# Patient Record
Sex: Female | Born: 1935 | Race: Black or African American | Hispanic: No | Smoking: Never smoker
Health system: Southern US, Community
[De-identification: ages and names within clinical notes are randomized; demographics above are authoritative.]

## PROBLEM LIST (undated history)

## (undated) DIAGNOSIS — K219 Gastro-esophageal reflux disease without esophagitis: Secondary | ICD-10-CM

## (undated) DIAGNOSIS — D126 Benign neoplasm of colon, unspecified: Secondary | ICD-10-CM

## (undated) DIAGNOSIS — I4819 Other persistent atrial fibrillation: Secondary | ICD-10-CM

## (undated) DIAGNOSIS — D509 Iron deficiency anemia, unspecified: Secondary | ICD-10-CM

## (undated) DIAGNOSIS — I272 Pulmonary hypertension, unspecified: Secondary | ICD-10-CM

## (undated) DIAGNOSIS — N183 Chronic kidney disease, stage 3 unspecified: Secondary | ICD-10-CM

## (undated) DIAGNOSIS — M1990 Unspecified osteoarthritis, unspecified site: Secondary | ICD-10-CM

## (undated) DIAGNOSIS — E785 Hyperlipidemia, unspecified: Secondary | ICD-10-CM

## (undated) DIAGNOSIS — R0989 Other specified symptoms and signs involving the circulatory and respiratory systems: Secondary | ICD-10-CM

## (undated) DIAGNOSIS — IMO0002 Reserved for concepts with insufficient information to code with codable children: Secondary | ICD-10-CM

## (undated) DIAGNOSIS — R42 Dizziness and giddiness: Secondary | ICD-10-CM

## (undated) DIAGNOSIS — Z8701 Personal history of pneumonia (recurrent): Secondary | ICD-10-CM

## (undated) DIAGNOSIS — F419 Anxiety disorder, unspecified: Secondary | ICD-10-CM

## (undated) DIAGNOSIS — R51 Headache: Secondary | ICD-10-CM

## (undated) DIAGNOSIS — E039 Hypothyroidism, unspecified: Secondary | ICD-10-CM

## (undated) DIAGNOSIS — K529 Noninfective gastroenteritis and colitis, unspecified: Secondary | ICD-10-CM

## (undated) DIAGNOSIS — I10 Essential (primary) hypertension: Secondary | ICD-10-CM

## (undated) DIAGNOSIS — I071 Rheumatic tricuspid insufficiency: Secondary | ICD-10-CM

## (undated) HISTORY — DX: Personal history of pneumonia (recurrent): Z87.01

## (undated) HISTORY — DX: Headache: R51

## (undated) HISTORY — DX: Unspecified osteoarthritis, unspecified site: M19.90

## (undated) HISTORY — DX: Pulmonary hypertension, unspecified: I27.20

## (undated) HISTORY — DX: Other specified symptoms and signs involving the circulatory and respiratory systems: R09.89

## (undated) HISTORY — DX: Hyperlipidemia, unspecified: E78.5

## (undated) HISTORY — DX: Dizziness and giddiness: R42

## (undated) HISTORY — DX: Anxiety disorder, unspecified: F41.9

## (undated) HISTORY — DX: Chronic kidney disease, stage 3 unspecified: N18.30

## (undated) HISTORY — DX: Hypothyroidism, unspecified: E03.9

## (undated) HISTORY — PX: OTHER SURGICAL HISTORY: SHX169

## (undated) HISTORY — DX: Benign neoplasm of colon, unspecified: D12.6

## (undated) HISTORY — DX: Reserved for concepts with insufficient information to code with codable children: IMO0002

## (undated) HISTORY — DX: Noninfective gastroenteritis and colitis, unspecified: K52.9

## (undated) HISTORY — DX: Essential (primary) hypertension: I10

## (undated) HISTORY — DX: Iron deficiency anemia, unspecified: D50.9

## (undated) HISTORY — DX: Other persistent atrial fibrillation: I48.19

## (undated) HISTORY — PX: TUBAL LIGATION: SHX77

## (undated) HISTORY — DX: Rheumatic tricuspid insufficiency: I07.1

## (undated) HISTORY — DX: Gastro-esophageal reflux disease without esophagitis: K21.9

---

## 1981-12-29 DIAGNOSIS — D509 Iron deficiency anemia, unspecified: Secondary | ICD-10-CM

## 1981-12-29 DIAGNOSIS — I10 Essential (primary) hypertension: Secondary | ICD-10-CM

## 1981-12-29 HISTORY — DX: Essential (primary) hypertension: I10

## 1981-12-29 HISTORY — DX: Iron deficiency anemia, unspecified: D50.9

## 1985-12-29 DIAGNOSIS — R519 Headache, unspecified: Secondary | ICD-10-CM

## 1985-12-29 HISTORY — DX: Headache, unspecified: R51.9

## 1998-12-29 DIAGNOSIS — E785 Hyperlipidemia, unspecified: Secondary | ICD-10-CM

## 1998-12-29 HISTORY — DX: Hyperlipidemia, unspecified: E78.5

## 1999-12-30 DIAGNOSIS — R0989 Other specified symptoms and signs involving the circulatory and respiratory systems: Secondary | ICD-10-CM

## 1999-12-30 DIAGNOSIS — M1990 Unspecified osteoarthritis, unspecified site: Secondary | ICD-10-CM

## 1999-12-30 HISTORY — DX: Unspecified osteoarthritis, unspecified site: M19.90

## 1999-12-30 HISTORY — DX: Other specified symptoms and signs involving the circulatory and respiratory systems: R09.89

## 2000-02-07 ENCOUNTER — Encounter: Payer: Self-pay | Admitting: Family Medicine

## 2000-02-07 ENCOUNTER — Encounter: Admission: RE | Admit: 2000-02-07 | Discharge: 2000-02-07 | Payer: Self-pay | Admitting: Family Medicine

## 2000-07-25 ENCOUNTER — Emergency Department (HOSPITAL_COMMUNITY): Admission: EM | Admit: 2000-07-25 | Discharge: 2000-07-25 | Payer: Self-pay

## 2000-07-30 ENCOUNTER — Encounter: Payer: Self-pay | Admitting: Emergency Medicine

## 2000-07-30 ENCOUNTER — Encounter: Admission: RE | Admit: 2000-07-30 | Discharge: 2000-07-30 | Payer: Self-pay | Admitting: Emergency Medicine

## 2001-07-12 ENCOUNTER — Other Ambulatory Visit: Admission: RE | Admit: 2001-07-12 | Discharge: 2001-07-12 | Payer: Self-pay | Admitting: Family Medicine

## 2001-12-29 DIAGNOSIS — K219 Gastro-esophageal reflux disease without esophagitis: Secondary | ICD-10-CM

## 2001-12-29 HISTORY — DX: Gastro-esophageal reflux disease without esophagitis: K21.9

## 2002-12-06 ENCOUNTER — Encounter: Payer: Self-pay | Admitting: Family Medicine

## 2002-12-06 ENCOUNTER — Encounter: Admission: RE | Admit: 2002-12-06 | Discharge: 2002-12-06 | Payer: Self-pay | Admitting: Family Medicine

## 2003-05-19 ENCOUNTER — Emergency Department (HOSPITAL_COMMUNITY): Admission: EM | Admit: 2003-05-19 | Discharge: 2003-05-20 | Payer: Self-pay | Admitting: Emergency Medicine

## 2003-05-20 ENCOUNTER — Encounter: Payer: Self-pay | Admitting: Emergency Medicine

## 2003-12-30 DIAGNOSIS — D126 Benign neoplasm of colon, unspecified: Secondary | ICD-10-CM

## 2003-12-30 HISTORY — DX: Benign neoplasm of colon, unspecified: D12.6

## 2004-06-02 ENCOUNTER — Encounter: Admission: RE | Admit: 2004-06-02 | Discharge: 2004-06-02 | Payer: Self-pay | Admitting: Emergency Medicine

## 2004-09-16 HISTORY — PX: COLONOSCOPY W/ BIOPSIES AND POLYPECTOMY: SHX1376

## 2007-11-29 ENCOUNTER — Encounter: Admission: RE | Admit: 2007-11-29 | Discharge: 2007-11-29 | Payer: Self-pay | Admitting: Family Medicine

## 2007-12-30 DIAGNOSIS — E039 Hypothyroidism, unspecified: Secondary | ICD-10-CM

## 2007-12-30 HISTORY — DX: Hypothyroidism, unspecified: E03.9

## 2009-01-11 ENCOUNTER — Encounter: Payer: Self-pay | Admitting: Internal Medicine

## 2009-08-20 ENCOUNTER — Encounter (INDEPENDENT_AMBULATORY_CARE_PROVIDER_SITE_OTHER): Payer: Self-pay | Admitting: *Deleted

## 2009-11-26 ENCOUNTER — Encounter: Admission: RE | Admit: 2009-11-26 | Discharge: 2009-11-26 | Payer: Self-pay | Admitting: Family Medicine

## 2009-12-29 DIAGNOSIS — K529 Noninfective gastroenteritis and colitis, unspecified: Secondary | ICD-10-CM

## 2009-12-29 HISTORY — DX: Noninfective gastroenteritis and colitis, unspecified: K52.9

## 2010-01-09 ENCOUNTER — Telehealth: Payer: Self-pay | Admitting: Internal Medicine

## 2010-01-11 ENCOUNTER — Encounter: Payer: Self-pay | Admitting: Internal Medicine

## 2010-05-24 ENCOUNTER — Encounter: Payer: Self-pay | Admitting: Internal Medicine

## 2010-05-31 ENCOUNTER — Encounter: Payer: Self-pay | Admitting: Internal Medicine

## 2010-07-11 ENCOUNTER — Encounter: Payer: Self-pay | Admitting: Internal Medicine

## 2010-07-23 ENCOUNTER — Ambulatory Visit: Payer: Self-pay | Admitting: Internal Medicine

## 2010-07-23 DIAGNOSIS — I4821 Permanent atrial fibrillation: Secondary | ICD-10-CM

## 2010-07-23 HISTORY — DX: Permanent atrial fibrillation: I48.21

## 2010-07-23 LAB — CONVERTED CEMR LAB: Prothrombin Time: 32.8 s — ABNORMAL HIGH (ref 9.7–11.8)

## 2010-07-30 ENCOUNTER — Telehealth: Payer: Self-pay | Admitting: Internal Medicine

## 2010-08-02 ENCOUNTER — Ambulatory Visit: Payer: Self-pay | Admitting: Internal Medicine

## 2010-08-25 ENCOUNTER — Ambulatory Visit: Payer: Self-pay | Admitting: Internal Medicine

## 2010-08-25 ENCOUNTER — Inpatient Hospital Stay (HOSPITAL_COMMUNITY): Admission: EM | Admit: 2010-08-25 | Discharge: 2010-08-26 | Payer: Self-pay | Admitting: Emergency Medicine

## 2010-09-09 ENCOUNTER — Encounter: Payer: Self-pay | Admitting: Internal Medicine

## 2010-09-11 ENCOUNTER — Ambulatory Visit: Payer: Self-pay | Admitting: Internal Medicine

## 2010-09-11 DIAGNOSIS — I10 Essential (primary) hypertension: Secondary | ICD-10-CM

## 2010-09-12 LAB — CONVERTED CEMR LAB
Chloride: 109 meq/L (ref 96–112)
Creatinine, Ser: 1.3 mg/dL — ABNORMAL HIGH (ref 0.4–1.2)
Glucose, Bld: 112 mg/dL — ABNORMAL HIGH (ref 70–99)
Potassium: 3.7 meq/L (ref 3.5–5.1)
Sodium: 144 meq/L (ref 135–145)

## 2010-09-23 ENCOUNTER — Telehealth: Payer: Self-pay | Admitting: Internal Medicine

## 2010-10-03 ENCOUNTER — Ambulatory Visit: Payer: Self-pay | Admitting: Internal Medicine

## 2010-10-04 ENCOUNTER — Telehealth: Payer: Self-pay | Admitting: Internal Medicine

## 2010-10-08 ENCOUNTER — Telehealth: Payer: Self-pay | Admitting: Internal Medicine

## 2010-11-04 ENCOUNTER — Ambulatory Visit: Payer: Self-pay | Admitting: Internal Medicine

## 2011-01-10 ENCOUNTER — Encounter: Payer: Self-pay | Admitting: Internal Medicine

## 2011-01-28 NOTE — Progress Notes (Signed)
Summary: Schedule Colonoscopy  Phone Note Outgoing Call Call back at [facility] Phone [phone]   Call placed by: Harlow Mares CMA Duncan Dull),  January 09, 2010 1:51 PM Call placed to: Patient Summary of Call: called patient to advise her that she is past due for her colonoscopy and asked her if she would like to schedule. She said she recieved the letter and has been thinking about having her colonoscopy. I stressed the importance of having her procedure. She states that she will think about it and call back when she decides.  Initial call taken by: Harlow Mares CMA Duncan Dull),  January 09, 2010 1:59 PM

## 2011-01-28 NOTE — Assessment & Plan Note (Signed)
Summary: Nurse Visit (Infectious Disease)   Nurse Visit   Vital Signs:  Patient profile:   77 year old female Height:      66 inches Weight:      175.50 pounds BMI:     28.43 Pulse rate:   96 / minute BP sitting:   138 / 100  (left arm)  Vitals Entered By: Dennis Bast, RN, BSN (August 19, 2010 4:10 PM)  Visit Type:  nurse visit  CC:  c/o nerves being bad/hassn't taken BP med yet.   Current Medications (verified): 1)  Warfarin Sodium 5 Mg Tabs (Warfarin Sodium) .... Use As Directed 2)  Triamterene-Hctz 37.5-25 Mg Tabs (Triamterene-Hctz) .... Take One Tablet By Mouth Once Daily. 3)  Metoprolol Succinate 50 Mg Xr24h-Tab (Metoprolol Succinate) .Marland Kitchen.. 1 Tablet in Am 1/2 Tablet in Pm 4)  Diltiazem Hcl Er Beads 240 Mg Xr24h-Cap (Diltiazem Hcl Er Beads) .... Take One Capsule By Mouth Daily 5)  Simvastatin 20 Mg Tabs (Simvastatin) .... Take One Tablet By Mouth Daily At Bedtime 6)  Synthroid 25 Mcg Tabs (Levothyroxine Sodium) .... Take One Tablet By Mouth Once Daily. 7)  Tranxene-T 3.75 Mg Tabs (Clorazepate Dipotassium) .... Uad As Needed  Allergies (verified): No Known Drug Allergies   Patient Instructions: 1)  Your physician recommends that you schedule a follow-up appointment in: keep scheduled apt with Dr Johney Frame 2)  Per Dr Johney Frame continue on medications and keep scheduled follow up.

## 2011-01-28 NOTE — Assessment & Plan Note (Signed)
Summary: ok per kelly/per check out/sf/ appt is 4:15/ gd   Visit Type:  rov Primary Provider:  Mosetta Putt, MD  CC:   .  History of Present Illness: The patient presents today for electrophysiology followup after her recent hospital admission for vertigo and orthostasis. She reports doing well since discharge.  Her symptoms of dizziness have resolved.  She continues to have fatigue and SOB with her afib.  The patient denies symptoms of palpitations, chest pain, orthopnea, PND, lower extremity edema,  presyncope, syncope, or neurologic sequela. The patient is tolerating medications without difficulties and is otherwise without complaint today.   Current Medications (verified): 1)  Coumadin 3 Mg Tabs (Warfarin Sodium) .... As Directed 2)  Metoprolol Succinate 50 Mg Xr24h-Tab (Metoprolol Succinate) .Marland Kitchen.. 1 1/2 Tab Qam...1 Tab At Bedtime 3)  Diltiazem Hcl Er Beads 240 Mg Xr24h-Cap (Diltiazem Hcl Er Beads) .... Take One Capsule By Mouth Daily 4)  Simvastatin 20 Mg Tabs (Simvastatin) .... Take One Tablet By Mouth Daily At Bedtime 5)  Synthroid 25 Mcg Tabs (Levothyroxine Sodium) .... Take One Tablet By Mouth Once Daily. 6)  Lorazepam 1 Mg Tabs (Lorazepam) .Marland Kitchen.. 1 Tab At Bedtime 7)  Coumadin 1 Mg Tabs (Warfarin Sodium) .... As Directed  Allergies (verified): No Known Drug Allergies  Past History:  Past Medical History: Reviewed history from 07/23/2010 and no changes required. HTN Persistent atrial fibrillation Anxiety Hyperlipidemia DDD/DJD vertigo GERD Osteoporosis Hypothyroidism  Past Surgical History: Reviewed history from 07/23/2010 and no changes required. s/p BLT  Social History: Reviewed history from 07/23/2010 and no changes required. Lives in [place] alone.  Tob- denies.  ETOH-none.  Drugs- none  Review of Systems       All systems are reviewed and negative except as listed in the HPI.   Vital Signs:  Patient profile:   77 year old female Height:      66  inches Weight:      180 pounds BMI:     29.16 Pulse rate:   121 / minute Pulse rhythm:   irregular BP sitting:   140 / 88  (left arm) Cuff size:   large  Vitals Entered By: Danielle Rankin, CMA (September 11, 2010 4:12 PM)  Physical Exam  General:  Well developed, well nourished, in no acute distress. Head:  normocephalic and atraumatic Eyes:  PERRLA/EOM intact; conjunctiva and lids normal. Mouth:  Teeth, gums and palate normal. Oral mucosa normal. Neck:  Neck supple, no JVD. No masses, thyromegaly or abnormal cervical nodes. Lungs:  Clear bilaterally to auscultation and percussion. Heart:  iRRR, no m/r/g Abdomen:  Bowel sounds positive; abdomen soft and non-tender without masses, organomegaly, or hernias noted. No hepatosplenomegaly. Msk:  Back normal, normal gait. Muscle strength and tone normal. Pulses:  pulses normal in all 4 extremities Extremities:  No clubbing or cyanosis.  no edema Neurologic:  Alert and oriented x 3. Skin:  Intact without lesions or rashes. Cervical Nodes:  no significant adenopathy Psych:  Normal affect.   EKG  Procedure date:  09/11/2010  Findings:      afib,  V rate 121 bpm, LAD, otherwise normal ekg  Impression & Recommendations:  Problem # 1:  ATRIAL FIBRILLATION (ICD-427.31) stable, but with elevated ventricular rates unfortunately, her INR was recently subtherapeutic, so we will have to defer cardioversioin/ AAD until she is therapeutic for 4 weeeks. She was unable to afford pradaxa.  I will increase metoprolol today to 50mg  two times a day. we will consider propafenone once  INRs are stable  Problem # 2:  HYPERTENSION, MILD (ICD-401.1) stable triamterene/hctz was recently discontinued due to hypokalemia and dehydration we will check BMET today  Other Orders: TLB-BMP (Basic Metabolic Panel-BMET) (80048-METABOL)  Patient Instructions: 1)  Your physician recommends that you schedule a follow-up appointment in: 4 weeks with Dr Johney Frame 2)   Your physician has recommended you make the following change in your medication: increase Metoprolol to 50mg  bid Prescriptions: METOPROLOL SUCCINATE 50 MG XR24H-TAB (METOPROLOL SUCCINATE) 1 by mouth two times a day  #60 x 6   Entered by:   Dennis Bast, RN, BSN   Authorized by:   Hillis Range, MD   Signed by:   Dennis Bast, RN, BSN on 09/11/2010   Method used:   Electronically to        [id number]* (retail)       [address] [place], [place]  [address]       Ph: [phone]       Fax: [phone]   RxID:   [id number]

## 2011-01-28 NOTE — Assessment & Plan Note (Signed)
Summary: nep/afib   Visit Type:  Initial Consult Primary Provider:  Mosetta Putt, MD  CC:  afib.  History of Present Illness: Ms Courts is a pleasant 77 yo AAF with a h/o persistent atrial fibrillation who presents today for cardiology consultation regarding her afib.  She reports initial palpitations in 2010 for which she went to Dr Elsie Lincoln.  She states having a stress test which was normal and per report, echo revealed pulmonary htn.  Most recently, she began having palpitations several weeks ago.  She had a holter monitor placed which per Dr Geoffery Lyons report revealed PACs and nonsustained SVT.  She also had rare PVCs. The patient reports that over tha past week that she has had increase palpitations  and "irregular heart beat".  She reports associated fatigue and decreased exercise tolerance.  She was evaluated by Dr Duaine Dredge and found to have afib with RVR.  She was initiated on coumadin and now presents to our office for further evaluation.  Current Medications (verified): 1)  Warfarin Sodium 5 Mg Tabs (Warfarin Sodium) .... Use As Directed 2)  Triamterene-Hctz 37.5-25 Mg Tabs (Triamterene-Hctz) .... Take One Tablet By Mouth Once Daily. 3)  Metoprolol Succinate 25 Mg Xr24h-Tab (Metoprolol Succinate) .... Take 1 1/2  Tablets By Mouth Daily 4)  Diltiazem Hcl Er Beads 240 Mg Xr24h-Cap (Diltiazem Hcl Er Beads) .... Take One Capsule By Mouth Daily 5)  Simvastatin 20 Mg Tabs (Simvastatin) .... Take One Tablet By Mouth Daily At Bedtime 6)  Synthroid 25 Mcg Tabs (Levothyroxine Sodium) .... Take One Tablet By Mouth Once Daily. 7)  Tranxene-T 3.75 Mg Tabs (Clorazepate Dipotassium) .... Uad As Needed  Allergies (verified): No Known Drug Allergies  Past History:  Past Medical History: HTN Persistent atrial fibrillation Anxiety Hyperlipidemia DDD/DJD vertigo GERD Osteoporosis Hypothyroidism  Past Surgical History: s/p BLT  Family History: HTN and DM  Social History: Lives in  [place] alone.  Tob- denies.  ETOH-none.  Drugs- none  Review of Systems       All systems are reviewed and negative except as listed in the HPI.   Vital Signs:  Patient profile:   77 year old female Height:      66 inches Weight:      177 pounds BMI:     28.67 Pulse rate:   123 / minute Pulse rhythm:   irregularly irregular BP sitting:   110 / 70  (left arm)  Vitals Entered By: Laurance Flatten CMA (July 23, 2010 1:33 PM)  Physical Exam  General:  Well developed, well nourished, in no acute distress. Head:  normocephalic and atraumatic Eyes:  PERRLA/EOM intact; conjunctiva and lids normal. Mouth:  Teeth, gums and palate normal. Oral mucosa normal. Neck:  Neck supple, no JVD. No masses, thyromegaly or abnormal cervical nodes. Lungs:  Clear bilaterally to auscultation and percussion. Heart:  iRRR, no m/r/g Abdomen:  Bowel sounds positive; abdomen soft and non-tender without masses, organomegaly, or hernias noted. No hepatosplenomegaly. Msk:  Back normal, normal gait. Muscle strength and tone normal. Pulses:  pulses normal in all 4 extremities Extremities:  No clubbing or cyanosis.  no edema Neurologic:  Alert and oriented x 3. Skin:  Intact without lesions or rashes. Cervical Nodes:  no significant adenopathy Psych:  Normal affect.   EKG  Procedure date:  07/23/2010  Findings:      afib,  V rates 123 bpm, nonspecific ST/T changes, LAD  Echocardiogram  Procedure date:  01/11/2009  Findings:      LVEF >  55%, mild LVH,  borderline RV enlargement, mild MR, RVSP 40-50,  LA size 36 cm,  mild to moderate TR  Comments:      [facility] echo  Nuclear Study  Procedure date:  01/11/2009  Findings:      lexiscan myoview, no evidence of ischemia,  EF 86%  Comments:      [facility] study  Impression & Recommendations:  Problem # 1:  ATRIAL FIBRILLATION (ICD-427.31) Ms Balla is a pleasant 77 yo female with symptomatic persistent atrial fibrillation who presents today for  cardiology evalation and further management.  She has symptomatic atrial fibrillation.  Her CHADS2 score is 2 (age and HTN).  She is appropriately anticoagulated with coumadin.  Therapeutic strategies for afib were discussed in detail with the patient today.  At this point, we will focus our efforts on rate control.  I have increased metoprolol succinate to 50mg  qam and 25mg  qpm.  She will return later this week for a nursing visit and we will increase metoprolol further (100mg  qam) if her heart rates remain above 100 bpm at that time. She will have weekly INRS followed by Dr Duaine Dredge.  Once her INRs have been >2 for four consecutive weeks, we will plan antiarrhytmic medical therapy.  As she has no evidence of ischemia by nuclear study 1/10, I would plan flecainide for rhythm control once her INRs are adequate.  Other Orders: TLB-PT (Protime) (85610-PTP)  Patient Instructions: 1)  Your physician recommends that you schedule a follow-up appointment in: 4 weeks.  2)  One week nurse visit.  3)  Your physician has recommended you make the following change in your medication: Increase Metoporol  to 50mg  1 tablet in the AM and 1/2 tablet in the PM. Prescriptions: METOPROLOL SUCCINATE 50 MG XR24H-TAB (METOPROLOL SUCCINATE) 1 tablet in AM 1/2 tablet in PM  #45 x 11   Entered by:   Dennis Bast, RN, BSN   Authorized by:   Hillis Range, MD   Signed by:   Dennis Bast, RN, BSN on 07/23/2010   Method used:   Electronically to        [id number]* (retail)       [address] [place], [place]  [address]       Ph: [phone]       Fax: [phone]   RxID:   [id number]

## 2011-01-28 NOTE — Progress Notes (Signed)
Summary: discuss new meds  Phone Note Call from Patient Call back at Home Phone [phone] Call back at [phone]   Caller: Patient Reason for Call: Talk to Nurse Summary of Call: per pt calling pt was seen on yesterday wants to discuss new meds. PROPAFENONE HCL 225 MG TABS one by mouth three times a day. Initial call taken by: Lorne Skeens,  October 04, 2010 10:56 AM  Follow-up for Phone Call        pharmacist told her that taking her new medication with toprol will make her bp too low.  Instructed pt that her BP at office visit on 10/03/2010 was elelvated and if her BP comes down some that would be good.  reviewed s/s of hypotension and asked her to check her BP's at random times a day once a day and as needed for any dizziness and or fatigue.  Pt states understanding and will call back if she has problems once she starts the medication. Follow-up by: Charolotte Capuchin, RN,  October 04, 2010 12:01 PM     Appended Document: discuss new meds I spoke with patient this am (10/05/10).  She took propafenone this am and became nauseated.  She denies CP, SOB, palpitations, presyncope, syncope, or other symptoms.  SHe is reluctant to continue the medicine.  My recommendation to her was to not take propafenone again today.  If she feels better tomorrow, she should take propafenone 225mg  two times a day (rather than three times a day) and contact my office if she has any further trouble.  If she is unable to tolerate propafenone, then we will have to consider a different agent.

## 2011-01-28 NOTE — Progress Notes (Signed)
Summary: pt feeling dizzy  Phone Note Call from Patient   Caller: Patient Reason for Call: Talk to Nurse Summary of Call: everytime pt stands up for the last few days she feels dizzy-med was changed last tuesday-pls advise [phone] Initial call taken by: Glynda Jaeger,  July 30, 2010 3:46 PM  Follow-up for Phone Call        explained to her to take 25 mg two times a day of the Toprol and see if that helps with the dizziness.  She will call me back on Thurs and let me know how she is doing Dennis Bast, RN, BSN  July 30, 2010 5:40 PM

## 2011-01-28 NOTE — Progress Notes (Signed)
Summary: Calling about medication  Phone Note Call from Patient Call back at Home Phone [phone]   Caller: Patient Summary of Call: Pt request call about medication Initial call taken by: Judie Grieve,  October 08, 2010 3:27 PM  Follow-up for Phone Call        pt can't take Propafenone, it made her "sick" after one dose.  She is not going to take.  So is there another medication you want to try.  I let her know I would call her back on Friday.  INR today was good per [name], RN, BSN  October 08, 2010 4:28 PM  Additional Follow-up for Phone Call Additional follow up Details #1::        stop propafenone start flecainide 50mg  two times a day  Additional Follow-up by: Hillis Range, MD,  October 13, 2010 8:55 PM    Additional Follow-up for Phone Call Additional follow up Details #2::    pt aware and will try Flecainide 50mg  two times a day Dennis Bast, RN, BSN  October 15, 2010 11:49 AM  New/Updated Medications: FLECAINIDE ACETATE 50 MG TABS (FLECAINIDE ACETATE) one by mouth bid Prescriptions: FLECAINIDE ACETATE 50 MG TABS (FLECAINIDE ACETATE) one by mouth bid  #60 x 3   Entered by:   Dennis Bast, RN, BSN   Authorized by:   Hillis Range, MD   Signed by:   Dennis Bast, RN, BSN on 10/15/2010   Method used:   Electronically to        [id number]* (retail)       [address] [place], [place]  [address]       Ph: [phone]       Fax: [phone]   RxID:   [id number]

## 2011-01-28 NOTE — Assessment & Plan Note (Signed)
Summary: 1 month rov @ 3:15 ok per dr. allred/sl   Visit Type:  Follow-up Primary Lori Bridges:  Lori Putt, MD   History of Present Illness: The patient presents today for electrophysiology followup.  She did not tolerate rhythmol due to nausea and fatigue.  She has also not tolerated flecainide due to nonspecific "feeling bad".  She is very much afraid of antiarrhythmic medicine and is very reluctant to take them.     She reports palpitations predominantly at night. The patient denies symptoms of chest pain, orthopnea, PND, lower extremity edema,  presyncope, syncope, or neurologic sequela. The patient is tolerating medications without difficulties and is otherwise without complaint today.   Medications Prior to Update: 1)  Toprol Xl 50 Mg Xr24h-Tab (Metoprolol Succinate) .... One By Mouth Two Times A Day 2)  Diltiazem Hcl Er Beads 240 Mg Xr24h-Cap (Diltiazem Hcl Er Beads) .... Take One Capsule By Mouth Daily 3)  Simvastatin 20 Mg Tabs (Simvastatin) .... Take One Tablet By Mouth Daily At Bedtime 4)  Synthroid 25 Mcg Tabs (Levothyroxine Sodium) .... Take One Tablet By Mouth Once Daily. 5)  Lorazepam 1 Mg Tabs (Lorazepam) .Marland Kitchen.. 1 Tab At Bedtime 6)  Coumadin 1 Mg Tabs (Warfarin Sodium) .... As Directed 7)  Flecainide Acetate 50 Mg Tabs (Flecainide Acetate) .... One By Mouth Bid  Allergies (verified): No Known Drug Allergies  Past History:  Past Medical History: Reviewed history from 07/23/2010 and no changes required. HTN Persistent atrial fibrillation Anxiety Hyperlipidemia DDD/DJD vertigo GERD Osteoporosis Hypothyroidism  Past Surgical History: Reviewed history from 07/23/2010 and no changes required. s/p BLT  Social History: Reviewed history from 07/23/2010 and no changes required. Lives in [place] alone.  Tob- denies.  ETOH-none.  Drugs- none  Review of Systems       All systems are reviewed and negative except as listed in the HPI.   Vital Signs:  Patient  profile:   77 year old female Height:      66 inches Weight:      181 pounds BMI:     29.32 Pulse rate:   60 / minute BP sitting:   180 / 90  (left arm)  Vitals Entered By: Lori Bridges CMA (November 04, 2010 3:12 PM) Comments Pt is not taking many of her medications as directed, including her flecainide.   Physical Exam  General:  Well developed, well nourished, in no acute distress. Head:  normocephalic and atraumatic Eyes:  PERRLA/EOM intact; conjunctiva and lids normal. Mouth:  Teeth, gums and palate normal. Oral mucosa normal. Neck:  Neck supple, no JVD. No masses, thyromegaly or abnormal cervical nodes. Lungs:  Clear bilaterally to auscultation and percussion. Heart:  iRRR, no m/r/g Abdomen:  Bowel sounds positive; abdomen soft and non-tender without masses, organomegaly, or hernias noted. No hepatosplenomegaly. Msk:  Back normal, normal gait. Muscle strength and tone normal. Pulses:  pulses normal in all 4 extremities Extremities:  No clubbing or cyanosis. Neurologic:  Alert and oriented x 3.   EKG  Procedure date:  11/04/2010  Findings:      afib,  V rate 83 bpm, otherwise normal ekg  Impression & Recommendations:  Problem # 1:  ATRIAL FIBRILLATION (ICD-427.31) Treatement of Lori Bridges's afib has been proven to be quite challenging.  She has not tolerated Ic agents for her afib.  She is presently well rate controlled and anticoagulated with coumadin.  I think that we should continue rate control as our longterm strategy at this point.  She  is not interested in ablation or other anti-arrhythmic medicines. We will stop flecainide which she is presently taking sporadically  Problem # 2:  HYPERTENSION, MILD (ICD-401.1) above goal start lisinopril 5mg  daily she will need BMET by Dr Duaine Dredge in several weeks.  Patient Instructions: 1)  Your physician recommends that you schedule a follow-up appointment in: 3 months with Dr Johney Frame 2)  Your physician has recommended you  make the following change in your medication: stop Flecainide and start Lisinopril 5mg  one by mouth daily Prescriptions: LISINOPRIL 5 MG TABS (LISINOPRIL) one by mouth daily  #30 x 11   Entered by:   Dennis Bast, RN, BSN   Authorized by:   Hillis Range, MD   Signed by:   Dennis Bast, RN, BSN on 11/04/2010   Method used:   Electronically to        [id number]* (retail)       [address] [place], [place]  [address]       Ph: [phone]       Fax: [phone]   RxID:   [id number]

## 2011-01-28 NOTE — Progress Notes (Signed)
Summary: meds that was changed has question  Phone Note Call from Patient Call back at Home Phone [phone]   Caller: Patient Reason for Call: Talk to Nurse Summary of Call: Per pt calling about meds that was changed has question.  Initial call taken by: Lorne Skeens,  September 23, 2010 8:39 AM    New/Updated Medications: TOPROL XL 50 MG XR24H-TAB (METOPROLOL SUCCINATE) one by mouth two times a day Prescriptions: TOPROL XL 50 MG XR24H-TAB (METOPROLOL SUCCINATE) one by mouth two times a day  #60 x 6   Entered by:   Dennis Bast, RN, BSN   Authorized by:   Hillis Range, MD   Signed by:   Dennis Bast, RN, BSN on 09/23/2010   Method used:   Electronically to        [id number]* (retail)       [address] [place], [place]  [address]       Ph: [phone]       Fax: [phone]   RxID:   [id number]   Appended Document: meds that was changed has question pt did not want generic

## 2011-01-28 NOTE — Letter (Signed)
Summary: [facility] [place]  [facility] [place]   Imported By: Roderic Ovens 07/26/2010 15:08:08  _____________________________________________________________________  External Attachment:    Type:   Image     Comment:   External Document

## 2011-01-28 NOTE — Assessment & Plan Note (Signed)
Summary: 4wk f/u @ 4:15 no avial slots in 4wks/sl   Visit Type:  Follow-up Primary Charm Stenner:  Mosetta Putt, MD   History of Present Illness: The patient presents today for electrophysiology followup.  Her symptoms of dizziness have resolved.  She continues to have fatigue and SOB with her afib. She reports palpitations predominantly at night. The patient denies symptoms of chest pain, orthopnea, PND, lower extremity edema,  presyncope, syncope, or neurologic sequela. The patient is tolerating medications without difficulties and is otherwise without complaint today.   Current Medications (verified): 1)  Toprol Xl 50 Mg Xr24h-Tab (Metoprolol Succinate) .... One By Mouth Two Times A Day 2)  Diltiazem Hcl Er Beads 240 Mg Xr24h-Cap (Diltiazem Hcl Er Beads) .... Take One Capsule By Mouth Daily 3)  Simvastatin 20 Mg Tabs (Simvastatin) .... Take One Tablet By Mouth Daily At Bedtime 4)  Synthroid 25 Mcg Tabs (Levothyroxine Sodium) .... Take One Tablet By Mouth Once Daily. 5)  Lorazepam 1 Mg Tabs (Lorazepam) .Marland Kitchen.. 1 Tab At Bedtime 6)  Coumadin 1 Mg Tabs (Warfarin Sodium) .... As Directed  Allergies (verified): No Known Drug Allergies  Past History:  Past Medical History: Reviewed history from 07/23/2010 and no changes required. HTN Persistent atrial fibrillation Anxiety Hyperlipidemia DDD/DJD vertigo GERD Osteoporosis Hypothyroidism  Past Surgical History: Reviewed history from 07/23/2010 and no changes required. s/p BLT  Social History: Reviewed history from 07/23/2010 and no changes required. Lives in [place] alone.  Tob- denies.  ETOH-none.  Drugs- none  Review of Systems       All systems are reviewed and negative except as listed in the HPI.   Vital Signs:  Patient profile:   77 year old female Height:      66 inches Weight:      178 pounds BMI:     28.83 Pulse rate:   103 / minute BP sitting:   148 / 90  (left arm)  Vitals Entered By: Laurance Flatten CMA  (October 03, 2010 4:08 PM)  Physical Exam  General:  Well developed, well nourished, in no acute distress. Head:  normocephalic and atraumatic Eyes:  PERRLA/EOM intact; conjunctiva and lids normal. Mouth:  Teeth, gums and palate normal. Oral mucosa normal. Neck:  Neck supple, no JVD. No masses, thyromegaly or abnormal cervical nodes. Lungs:  Clear bilaterally to auscultation and percussion. Heart:  iRRR, no m/r/g Abdomen:  Bowel sounds positive; abdomen soft and non-tender without masses, organomegaly, or hernias noted. No hepatosplenomegaly. Msk:  Back normal, normal gait. Muscle strength and tone normal. Pulses:  pulses normal in all 4 extremities Extremities:  No clubbing or cyanosis. Neurologic:  Alert and oriented x 3.   EKG  Procedure date:  10/03/2010  Findings:      afib,  V rate 86 bpm  Impression & Recommendations:  Problem # 1:  ATRIAL FIBRILLATION (ICD-427.31) stable with better rate control. Her INR is now therapeutic (2.7 today) She was unable to afford pradaxa. Today, I will start propafenone 225mg  three times a day.  She understands risks of stroke and arrhythmia with this medicine but wishes to proceed to attempt to achieve sinus rhythm. She will continue coumadin.  We will consider cardioversion if she remains in afib after 4 weeks of propafenone. We will plan stress testing after she has returned to sinus rhythm.  Problem # 2:  HYPERTENSION, MILD (ICD-401.1) stable no changes today  Patient Instructions: 1)  Your physician recommends that you schedule a follow-up appointment in: 4 weeks  with Dr Johney Frame 2)  Your physician has recommended you make the following change in your medication: start Propafenone 225mg  three times a day Prescriptions: PROPAFENONE HCL 225 MG TABS (PROPAFENONE HCL) one by mouth three times a day  #90 x 3   Entered by:   Dennis Bast, RN, BSN   Authorized by:   Hillis Range, MD   Signed by:   Dennis Bast, RN, BSN on 10/03/2010    Method used:   Electronically to        [facility] [address] [id number] (retail)       [address] [place], [place]  [address]       Ph: [phone]       Fax: [phone]   RxID:   [id number]

## 2011-02-10 ENCOUNTER — Encounter: Payer: Self-pay | Admitting: Internal Medicine

## 2011-02-10 ENCOUNTER — Ambulatory Visit (INDEPENDENT_AMBULATORY_CARE_PROVIDER_SITE_OTHER): Payer: Medicare Other | Admitting: Internal Medicine

## 2011-02-10 DIAGNOSIS — I10 Essential (primary) hypertension: Secondary | ICD-10-CM

## 2011-02-10 DIAGNOSIS — I4891 Unspecified atrial fibrillation: Secondary | ICD-10-CM

## 2011-02-19 ENCOUNTER — Telehealth: Payer: Self-pay | Admitting: Internal Medicine

## 2011-02-19 NOTE — Assessment & Plan Note (Signed)
Summary: [facility]   Visit Type:  Follow-up Primary Provider:  Mosetta Putt, MD   History of Present Illness: The patient presents today for cardiology followup.  She reports palpitations predominantly at night. The patient denies symptoms of orthopnea, PND, lower extremity edema,  presyncope, syncope, or neurologic sequela.  She reports occasional fleeting pain along her L axillar and under her R rib cage.  The patient is tolerating medications without difficulties and is otherwise without complaint today.   Problems Prior to Update: 1)  Hypertension, Mild  (ICD-401.1) 2)  Atrial Fibrillation  (ICD-427.31)  Current Medications (verified): 1)  Toprol Xl 50 Mg Xr24h-Tab (Metoprolol Succinate) .... One By Mouth Two Times A Day 2)  Diltiazem Hcl Er Beads 240 Mg Xr24h-Cap (Diltiazem Hcl Er Beads) .... Take One Capsule By Mouth Daily 3)  Simvastatin 40 Mg Tabs (Simvastatin) .... Take 1/4  Tablet By Mouth Daily At Bedtime 4)  Synthroid 25 Mcg Tabs (Levothyroxine Sodium) .... Take One Tablet By Mouth Once Daily. 5)  Lorazepam 1 Mg Tabs (Lorazepam) .Marland Kitchen.. 1 Tab At Bedtime 6)  Coumadin 1 Mg Tabs (Warfarin Sodium) .... As Directed  Allergies (verified): No Known Drug Allergies  Past History:  Past Medical History: Reviewed history from 07/23/2010 and no changes required. HTN Persistent atrial fibrillation Anxiety Hyperlipidemia DDD/DJD vertigo GERD Osteoporosis Hypothyroidism  Past Surgical History: Reviewed history from 07/23/2010 and no changes required. s/p BLT  Family History: Reviewed history from 07/23/2010 and no changes required. HTN and DM  Social History: Reviewed history from 07/23/2010 and no changes required. Lives in [place] alone.  Tob- denies.  ETOH-none.  Drugs- none  Review of Systems       All systems are reviewed and negative except as listed in the HPI.   Vital Signs:  Patient profile:   77 year old female Height:      66  inches Weight:      173 pounds BMI:     28.02 Pulse rate:   89 / minute Pulse rhythm:   irregular Resp:     18 per minute BP sitting:   158 / 98  (left arm) Cuff size:   large  Vitals Entered By: Vikki Ports (February 10, 2011 2:26 PM)  Physical Exam  General:  Well developed, well nourished, in no acute distress. Head:  normocephalic and atraumatic Eyes:  PERRLA/EOM intact; conjunctiva and lids normal. Mouth:  Teeth, gums and palate normal. Oral mucosa normal. Neck:  Neck supple, no JVD. No masses, thyromegaly or abnormal cervical nodes. Lungs:  Clear bilaterally to auscultation and percussion. Heart:  iRRR, no m/r/g Abdomen:  Bowel sounds positive; abdomen soft and non-tender without masses, organomegaly, or hernias noted. No hepatosplenomegaly. Msk:  Back normal, normal gait. Muscle strength and tone normal. Extremities:  No clubbing or cyanosis. Neurologic:  Alert and oriented x 3. Skin:  Intact without lesions or rashes. Psych:  Normal affect.   EKG  Procedure date:  02/10/2011  Findings:      afib, V rate 89 bpm,  LAD, otherwise normal ekg  Impression & Recommendations:  Problem # 1:  ATRIAL FIBRILLATION (ICD-427.31)  Her updated medication list for this problem includes:    Toprol Xl 50 Mg Xr24h-tab (Metoprolol succinate) ..... One by mouth two times a day    Coumadin 1 Mg Tabs (Warfarin sodium) .Marland Kitchen... As directed  Her updated medication list for this problem includes:    Toprol Xl 50 Mg Xr24h-tab (Metoprolol succinate) ..... One by mouth two  times a day    Coumadin 1 Mg Tabs (Warfarin sodium) .Marland Kitchen... As directed  Problem # 2:  HYPERTENSION, MILD (ICD-401.1)  above goal Again, treatment of this issue is very difficult.  She has not taken lisinopril as directed.  She states that Dr Duaine Dredge told her not to take this medicine but is unclear as to why.  I am unaware of ACE related alergies.  I think that adequate BP control is essential.  We will try to obtain  records from Dr [facility] office.  She declines any adjustment of antihypertensives today.  The following medications were removed from the medication list:    Lisinopril 5 Mg Tabs (Lisinopril) ..... One by mouth daily Her updated medication list for this problem includes:    Toprol Xl 50 Mg Xr24h-tab (Metoprolol succinate) ..... One by mouth two times a day    Diltiazem Hcl Er Beads 240 Mg Xr24h-cap (Diltiazem hcl er beads) .Marland Kitchen... Take one capsule by mouth daily  Patient Instructions: 1)  follow up with Dr.Blomgren concerning your blood pressure 2)  Your physician wants you to follow-up in: 6 months with Dr.Jadesola Poynter  You will receive a reminder letter in the mail two months in advance. If you don't receive a letter, please call our office to schedule the follow-up appointment.

## 2011-02-25 NOTE — Progress Notes (Signed)
Summary: callinmg about pt B/P  Phone Note Other Incoming   Caller: Dr.Blomgren/ [phone] Summary of Call: Calling with information about pt B/P per Dr.Chaunta Bejarano Initial call taken by: Judie Grieve,  February 19, 2011 10:25 AM  Follow-up for Phone Call        spoke with Neysa Bonito at Dr [facility] she states she is really just doing what she wants to do.  I let Neysa Bonito know we knew how the patient is about taking her medications  They will continue to follow her BP's Dennis Bast, RN, BSN  February 19, 2011 1:08 PM

## 2011-02-26 ENCOUNTER — Other Ambulatory Visit: Payer: Self-pay | Admitting: Family Medicine

## 2011-02-26 DIAGNOSIS — R110 Nausea: Secondary | ICD-10-CM

## 2011-02-26 DIAGNOSIS — R1030 Lower abdominal pain, unspecified: Secondary | ICD-10-CM

## 2011-03-03 ENCOUNTER — Ambulatory Visit
Admission: RE | Admit: 2011-03-03 | Discharge: 2011-03-03 | Disposition: A | Discharge status: Home / Self Care | Payer: Medicare Other | Source: Ambulatory Visit | Attending: Family Medicine | Admitting: Family Medicine

## 2011-03-03 DIAGNOSIS — R110 Nausea: Secondary | ICD-10-CM

## 2011-03-03 DIAGNOSIS — R1030 Lower abdominal pain, unspecified: Secondary | ICD-10-CM

## 2011-03-03 MED ORDER — IOHEXOL 300 MG/ML  SOLN: 100.0000 mL | Freq: Once | INTRAMUSCULAR | Status: AC | PRN

## 2011-03-11 NOTE — Letter (Signed)
Summary: [facility]   Imported By: Marylou Mccoy 03/04/2011 16:05:03  _____________________________________________________________________  External Attachment:    Type:   Image     Comment:   External Document

## 2011-03-13 LAB — COMPREHENSIVE METABOLIC PANEL
ALT: 14 U/L (ref 0–35)
AST: 23 U/L (ref 0–37)
Albumin: 3.6 g/dL (ref 3.5–5.2)
CO2: 27 mEq/L (ref 19–32)
Chloride: 109 mEq/L (ref 96–112)
Glucose, Bld: 161 mg/dL — ABNORMAL HIGH (ref 70–99)
Potassium: 4 mEq/L (ref 3.5–5.1)
Total Bilirubin: 0.5 mg/dL (ref 0.3–1.2)
Total Protein: 6.3 g/dL (ref 6.0–8.3)

## 2011-03-13 LAB — CARDIAC PANEL(CRET KIN+CKTOT+MB+TROPI)
CK, MB: 1.2 ng/mL (ref 0.3–4.0)
Relative Index: INVALID (ref 0.0–2.5)
Total CK: 62 U/L (ref 7–177)
Total CK: 64 U/L (ref 7–177)

## 2011-03-13 LAB — DIFFERENTIAL
Basophils Absolute: 0 10*3/uL (ref 0.0–0.1)
Basophils Relative: 0 % (ref 0–1)
Eosinophils Absolute: 0 10*3/uL (ref 0.0–0.7)
Eosinophils Absolute: 0.1 10*3/uL (ref 0.0–0.7)
Eosinophils Relative: 0 % (ref 0–5)
Lymphs Abs: 3.8 10*3/uL (ref 0.7–4.0)
Monocytes Absolute: 0.3 10*3/uL (ref 0.1–1.0)
Monocytes Absolute: 0.5 10*3/uL (ref 0.1–1.0)
Neutro Abs: 5.9 10*3/uL (ref 1.7–7.7)
Neutrophils Relative %: 79 % — ABNORMAL HIGH (ref 43–77)

## 2011-03-13 LAB — BASIC METABOLIC PANEL
Calcium: 9.6 mg/dL (ref 8.4–10.5)
Creatinine, Ser: 1.34 mg/dL — ABNORMAL HIGH (ref 0.4–1.2)
GFR calc Af Amer: 46 mL/min — ABNORMAL LOW (ref >60)
GFR calc non Af Amer: 38 mL/min — ABNORMAL LOW (ref >60)
GFR calc non Af Amer: 40 mL/min — ABNORMAL LOW (ref >60)
Potassium: 2.6 mEq/L — CL (ref 3.5–5.1)
Potassium: 4.1 mEq/L (ref 3.5–5.1)
Sodium: 141 mEq/L (ref 135–145)

## 2011-03-13 LAB — CK TOTAL AND CKMB (NOT AT ARMC)
Relative Index: INVALID (ref 0.0–2.5)
Relative Index: INVALID (ref 0.0–2.5)
Total CK: 67 U/L (ref 7–177)

## 2011-03-13 LAB — CBC
Hemoglobin: 14.4 g/dL (ref 12.0–15.0)
MCH: 28.9 pg (ref 26.0–34.0)
MCV: 87.5 fL (ref 78.0–100.0)
Platelets: 205 10*3/uL (ref 150–400)
Platelets: 216 10*3/uL (ref 150–400)
RBC: 4.99 MIL/uL (ref 3.87–5.11)
WBC: 7.5 10*3/uL (ref 4.0–10.5)

## 2011-03-13 LAB — APTT: aPTT: 36 seconds (ref 24–37)

## 2011-03-13 LAB — PROTIME-INR
INR: 2.13 — ABNORMAL HIGH (ref 0.00–1.49)
Prothrombin Time: 28.6 seconds — ABNORMAL HIGH (ref 11.6–15.2)

## 2011-03-13 LAB — TROPONIN I: Troponin I: 0.02 ng/mL (ref 0.00–0.06)

## 2011-07-03 ENCOUNTER — Encounter: Payer: Self-pay | Admitting: Internal Medicine

## 2011-07-23 ENCOUNTER — Telehealth: Payer: Self-pay | Admitting: Internal Medicine

## 2011-07-23 ENCOUNTER — Other Ambulatory Visit: Payer: Self-pay | Admitting: Family Medicine

## 2011-07-23 DIAGNOSIS — R350 Frequency of micturition: Secondary | ICD-10-CM

## 2011-07-23 NOTE — Telephone Encounter (Signed)
Colon was in 2005

## 2011-07-23 NOTE — Telephone Encounter (Signed)
Colon was in 2005 showed diverticulosis, internal hems and 3 mm tubular adenoma removed from the sigmoid.  Reports are in your office please review and advise appropriate recall date.

## 2011-07-24 ENCOUNTER — Ambulatory Visit
Admission: RE | Admit: 2011-07-24 | Discharge: 2011-07-24 | Disposition: A | Discharge status: Home / Self Care | Payer: Medicare Other | Source: Ambulatory Visit | Attending: Family Medicine | Admitting: Family Medicine

## 2011-07-24 DIAGNOSIS — R350 Frequency of micturition: Secondary | ICD-10-CM

## 2011-07-25 ENCOUNTER — Other Ambulatory Visit: Payer: Medicare Other

## 2011-07-30 ENCOUNTER — Encounter: Payer: Self-pay | Admitting: Internal Medicine

## 2011-07-30 NOTE — Telephone Encounter (Signed)
Correction: Not clear colonoscopy really needed given age, comorbidities and findings at last colonoscopy We can discuss in office with rev (on warfarin for Afib)

## 2011-07-30 NOTE — Telephone Encounter (Signed)
I have left a message for Christy with Dr Polly Cobia office to have the patient schedule an office visit to discuss.

## 2011-07-30 NOTE — Telephone Encounter (Signed)
It is reasonable for her to have a colonoscopy now  V76.51 and v12.72

## 2011-08-13 ENCOUNTER — Ambulatory Visit (INDEPENDENT_AMBULATORY_CARE_PROVIDER_SITE_OTHER): Payer: Medicare Other | Admitting: Internal Medicine

## 2011-08-13 ENCOUNTER — Encounter: Payer: Self-pay | Admitting: Internal Medicine

## 2011-08-13 DIAGNOSIS — I4891 Unspecified atrial fibrillation: Secondary | ICD-10-CM

## 2011-08-13 DIAGNOSIS — I10 Essential (primary) hypertension: Secondary | ICD-10-CM

## 2011-08-13 NOTE — Assessment & Plan Note (Signed)
Permanent atrial fibrillation Continue coumadin and rate control

## 2011-08-13 NOTE — Progress Notes (Signed)
The patient presents today for routine electrophysiology followup.  Since last being seen in our clinic, the patient reports doing very well.  She reports occasional dizziness with maxzide.  Today, she denies symptoms of palpitations, chest pain, shortness of breath, orthopnea, PND, lower extremity edema, , presyncope, syncope, or neurologic sequela.  The patient feels that she is tolerating medications without difficulties and is otherwise without complaint today.   Past Medical History  Diagnosis Date  . HTN (hypertension)   . Persistent atrial fibrillation   . Anxiety   . Hyperlipidemia   . DDD (degenerative disc disease)   . DJD (degenerative joint disease)   . Vertigo   . GERD (gastroesophageal reflux disease)   . Osteoporosis   . Hypothyroidism    Past Surgical History  Procedure Date  . S/p blt     Current Outpatient Prescriptions  Medication Sig Dispense Refill  . diltiazem (TIAZAC) 240 MG 24 hr capsule Take 240 mg by mouth daily.        Marland Kitchen levothyroxine (SYNTHROID, LEVOTHROID) 25 MCG tablet Take 25 mcg by mouth daily.        Marland Kitchen LORazepam (ATIVAN) 1 MG tablet Take 1 mg by mouth 2 (two) times daily.       . metoprolol (TOPROL-XL) 50 MG 24 hr tablet Take 50 mg by mouth 2 (two) times daily.        Marland Kitchen omeprazole (PRILOSEC) 20 MG capsule Take 20 mg by mouth as needed.        . simvastatin (ZOCOR) 40 MG tablet Take 10 mg by mouth at bedtime.        Marland Kitchen tobramycin-dexamethasone (TOBRADEX) ophthalmic solution Place 1 drop into both eyes as needed.        . triamterene-hydrochlorothiazide (MAXZIDE-25) 37.5-25 MG per tablet Take 1 tablet by mouth as needed.        . warfarin (COUMADIN) 1 MG tablet as directed. Use as directed         No Known Allergies  History   Social History  . Marital Status: Widowed    Spouse Name: N/A    Number of Children: N/A  . Years of Education: N/A   Occupational History  . Not on file.   Social History Main Topics  . Smoking status: Never Smoker     . Smokeless tobacco: Not on file  . Alcohol Use: No  . Drug Use: No  . Sexually Active: Not on file   Other Topics Concern  . Not on file   Social History Narrative  . No narrative on file    Family History  Problem Relation Age of Onset  . Hypertension    . Diabetes     Physical Exam: Filed Vitals:   08/13/11 1420  BP: 132/88  Pulse: 93  Height: 5\' 6"  (1.676 m)  Weight: 185 lb (83.915 kg)    GEN- The patient is well appearing, alert and oriented x 3 today.   Head- normocephalic, atraumatic Eyes-  Sclera clear, conjunctiva pink Ears- hearing intact Oropharynx- clear Neck- supple, no JVP Lymph- no cervical lymphadenopathy Lungs- Clear to ausculation bilaterally, normal work of breathing Heart- irregular rate and rhythm GI- soft, NT, ND, + BS Extremities- no clubbing, cyanosis, or edema   Assessment and Plan:

## 2011-08-13 NOTE — Patient Instructions (Signed)
Your physician wants you to follow-up in: 6 months with Dr. Allred. You will receive a reminder letter in the mail two months in advance. If you don't receive a letter, please call our office to schedule the follow-up appointment.  

## 2011-08-13 NOTE — Assessment & Plan Note (Signed)
Stable No change required today  

## 2011-09-02 ENCOUNTER — Encounter: Payer: Self-pay | Admitting: Internal Medicine

## 2011-09-16 ENCOUNTER — Other Ambulatory Visit: Payer: Self-pay | Admitting: Family Medicine

## 2011-09-16 ENCOUNTER — Ambulatory Visit
Admission: RE | Admit: 2011-09-16 | Discharge: 2011-09-16 | Disposition: A | Discharge status: Home / Self Care | Payer: Medicare Other | Source: Ambulatory Visit | Attending: Family Medicine | Admitting: Family Medicine

## 2011-09-16 DIAGNOSIS — M25571 Pain in right ankle and joints of right foot: Secondary | ICD-10-CM

## 2011-10-09 ENCOUNTER — Ambulatory Visit (INDEPENDENT_AMBULATORY_CARE_PROVIDER_SITE_OTHER): Payer: Medicare Other | Admitting: Internal Medicine

## 2011-10-09 ENCOUNTER — Encounter: Payer: Self-pay | Admitting: Internal Medicine

## 2011-10-09 VITALS — BP 140/90 | HR 114 | Ht 66.0 in | Wt 183.0 lb

## 2011-10-09 DIAGNOSIS — I4891 Unspecified atrial fibrillation: Secondary | ICD-10-CM

## 2011-10-09 DIAGNOSIS — Z860100 Personal history of colon polyps, unspecified: Secondary | ICD-10-CM | POA: Insufficient documentation

## 2011-10-09 NOTE — Assessment & Plan Note (Addendum)
It is reasonable to pursue a screening and surveillance colonoscopy. She has some medical problems but reasonable life expectancy I would think. We discussed this, and how was not absolutely necessary. She wants to wait a few months and due in early 2013 if she doesn't. We will contact her about following up. She understands there are always risks of colon cancer going undetected if she does not have the colonoscopy.

## 2011-10-09 NOTE — Assessment & Plan Note (Signed)
No history of TIA or stroke. Seems like she would be able to hold her warfarin for colonoscopy if needed, would check with cardiology before doing so.

## 2011-10-09 NOTE — Progress Notes (Signed)
  Subjective:    Patient ID: Lori Bridges, female    DOB: [birth year], 77 y.o.   MRN: [id number]  HPI 77 year old African American woman with a history of a 3 mm colon adenoma, removed 2005. She was advised to return for follow up colonoscopy by her primary care physician. She's not having any active GI symptoms. He does complain of some left ear pressure and dizziness at times. Some mild sore throat. She recently sprained her ankle and is using a crutch and a brace on the left   Review of Systems As the    Objective:   Physical Exam Well-developed well-nourished elderly African American woman no acute distress Left ear tympanic membrane looks normal to me there is some cerumen in the canal Pharynx mild posterior pharyngeal erythema Lungs clear Heart S1-S2 irregular consistent with her age her fibrillation no gallop       Assessment & Plan:

## 2011-10-09 NOTE — Patient Instructions (Signed)
You will be due for a recall colonoscopy in 12/2011. We will send you a reminder in the mail when it gets closer to that time.

## 2012-01-05 ENCOUNTER — Other Ambulatory Visit: Payer: Self-pay | Admitting: Family Medicine

## 2012-01-05 ENCOUNTER — Ambulatory Visit
Admission: RE | Admit: 2012-01-05 | Discharge: 2012-01-05 | Disposition: A | Discharge status: Home / Self Care | Payer: Medicare Other | Source: Ambulatory Visit | Attending: Family Medicine | Admitting: Family Medicine

## 2012-01-05 DIAGNOSIS — R0600 Dyspnea, unspecified: Secondary | ICD-10-CM

## 2012-02-16 ENCOUNTER — Encounter: Payer: Self-pay | Admitting: Internal Medicine

## 2012-02-16 ENCOUNTER — Ambulatory Visit (INDEPENDENT_AMBULATORY_CARE_PROVIDER_SITE_OTHER): Payer: Medicare Other | Admitting: Internal Medicine

## 2012-02-16 VITALS — BP 122/72 | HR 86 | Wt 184.8 lb

## 2012-02-16 DIAGNOSIS — I10 Essential (primary) hypertension: Secondary | ICD-10-CM

## 2012-02-16 DIAGNOSIS — R110 Nausea: Secondary | ICD-10-CM | POA: Insufficient documentation

## 2012-02-16 DIAGNOSIS — I4891 Unspecified atrial fibrillation: Secondary | ICD-10-CM

## 2012-02-16 NOTE — Assessment & Plan Note (Signed)
She reports vague nausea and satiety with eating.  I have encouraged her to follow-up with Dr Duaine Dredge and also with Dr Leone Payor if not improved.

## 2012-02-16 NOTE — Assessment & Plan Note (Signed)
Rate controlled Goal INR 2-3  Repeat echo to evaluate for structural changes

## 2012-02-16 NOTE — Progress Notes (Signed)
The patient presents today for routine cardiology followup.  Since last being seen in our clinic, the patient reports doing reasonably well.  She reports occasional nausea and abdominal fullness with eating.  Today, she denies symptoms of palpitations, chest pain, shortness of breath, orthopnea, PND, lower extremity edema, dizziness , presyncope, syncope, or neurologic sequela.  The patient feels that she is tolerating medications without difficulties and is otherwise without complaint today.   Past Medical History  Diagnosis Date  . HTN (hypertension) 1983  . Persistent atrial fibrillation 2009  . Anxiety age 78  . Hyperlipidemia 2000  . DDD (degenerative disc disease)   . DJD (degenerative joint disease)   . Vertigo   . GERD (gastroesophageal reflux disease) 2003  . Osteoporosis 2001  . Hypothyroidism 2009  . Adenomatous colon polyp 2005  . Vitamin d deficiency 2011  . Gastroenteritis 2011  . History of pneumonia   . Iron deficiency anemia 1983  . Persistent headaches 1987  . Osteoarthritis 2001  . Carotid artery bruit 2001    left   Past Surgical History  Procedure Date  . S/p blt   . Colonoscopy w/ biopsies and polypectomy 09/16/2004    adenomatous polyp, diverticulosis, internal hemorrhoids  . Tubal ligation     Current Outpatient Prescriptions  Medication Sig Dispense Refill  . Cholecalciferol (VITAMIN D3) 2000 UNITS TABS Take 1 tablet by mouth daily.      Marland Kitchen diltiazem (TIAZAC) 240 MG 24 hr capsule Take 240 mg by mouth daily.        Marland Kitchen levothyroxine (SYNTHROID, LEVOTHROID) 25 MCG tablet Take 25 mcg by mouth daily.        Marland Kitchen LORazepam (ATIVAN) 1 MG tablet Take 1 mg by mouth 3 (three) times daily.       . metoprolol (TOPROL-XL) 50 MG 24 hr tablet Take 50 mg by mouth 2 (two) times daily.        . simvastatin (ZOCOR) 40 MG tablet Take 10 mg by mouth at bedtime.        . triamterene-hydrochlorothiazide (MAXZIDE-25) 37.5-25 MG per tablet Take 1 tablet by mouth daily.       Marland Kitchen  warfarin (COUMADIN) 1 MG tablet as directed. Use as directed         Allergies  Allergen Reactions  . Ace Inhibitors Cough  . Codeine Nausea Only  . Colestid Flavored     Malaise  . Doxycycline   . Lovastatin   . Meclizine     Tachycardia  . Pravastatin     Arthralgia, Myalgias  . Sulfa Antibiotics   . Zetia (Ezetimibe)     Arthralgia, Myalgias    History   Social History  . Marital Status: Widowed    Spouse Name: N/A    Number of Children: 7  . Years of Education: N/A   Occupational History  . Retired     [employer]   Social History Main Topics  . Smoking status: Never Smoker   . Smokeless tobacco: Never Used  . Alcohol Use: No  . Drug Use: No  . Sexually Active: Not on file   Other Topics Concern  . Not on file   Social History Narrative  . No narrative on file    Family History  Problem Relation Age of Onset  . Hypertension Father   . Diabetes Mother   . Peripheral vascular disease Father   . Heart disease Brother 73  . Liver cancer Sister    Physical Exam: Ceasar Mons  Vitals:   02/16/12 1605  BP: 122/72  Pulse: 86  Weight: 184 lb 12.8 oz (83.825 kg)    GEN- The patient is well appearing, alert and oriented x 3 today.   Head- normocephalic, atraumatic Eyes-  Sclera clear, conjunctiva pink Ears- hearing intact Oropharynx- clear Neck- supple, no JVP Lymph- no cervical lymphadenopathy Lungs- Clear to ausculation bilaterally, normal work of breathing Heart- irregular rate and rhythm GI- soft, NT, ND, + BS Extremities- no clubbing, cyanosis, or edema  ekg today reveals afib, V rate 86 bpm, low voltage  Assessment and Plan:

## 2012-02-16 NOTE — Assessment & Plan Note (Signed)
Stable No change required today  

## 2012-02-16 NOTE — Patient Instructions (Signed)
Your physician wants you to follow-up in: 6 months with Dr. Allred. You will receive a reminder letter in the mail two months in advance. If you don't receive a letter, please call our office to schedule the follow-up appointment.  

## 2012-02-25 ENCOUNTER — Ambulatory Visit (HOSPITAL_COMMUNITY): Payer: Medicare Other | Attending: Cardiology

## 2012-02-25 ENCOUNTER — Other Ambulatory Visit: Payer: Self-pay

## 2012-02-25 DIAGNOSIS — I10 Essential (primary) hypertension: Secondary | ICD-10-CM | POA: Insufficient documentation

## 2012-02-25 DIAGNOSIS — I4891 Unspecified atrial fibrillation: Secondary | ICD-10-CM | POA: Insufficient documentation

## 2012-02-25 DIAGNOSIS — I379 Nonrheumatic pulmonary valve disorder, unspecified: Secondary | ICD-10-CM | POA: Insufficient documentation

## 2012-02-25 DIAGNOSIS — I080 Rheumatic disorders of both mitral and aortic valves: Secondary | ICD-10-CM | POA: Insufficient documentation

## 2012-02-25 DIAGNOSIS — E785 Hyperlipidemia, unspecified: Secondary | ICD-10-CM | POA: Insufficient documentation

## 2012-02-25 DIAGNOSIS — I079 Rheumatic tricuspid valve disease, unspecified: Secondary | ICD-10-CM | POA: Insufficient documentation

## 2012-03-02 ENCOUNTER — Telehealth: Payer: Self-pay | Admitting: Internal Medicine

## 2012-03-02 NOTE — Telephone Encounter (Signed)
New problem Pt said she hasnt heard anything about echo she had done. Please call

## 2012-03-04 ENCOUNTER — Ambulatory Visit (INDEPENDENT_AMBULATORY_CARE_PROVIDER_SITE_OTHER): Payer: Medicare Other | Admitting: Internal Medicine

## 2012-03-04 ENCOUNTER — Encounter: Payer: Self-pay | Admitting: Internal Medicine

## 2012-03-04 VITALS — BP 140/60 | HR 58 | Resp 18 | Ht 72.0 in | Wt 186.4 lb

## 2012-03-04 DIAGNOSIS — I071 Rheumatic tricuspid insufficiency: Secondary | ICD-10-CM

## 2012-03-04 DIAGNOSIS — I10 Essential (primary) hypertension: Secondary | ICD-10-CM

## 2012-03-04 DIAGNOSIS — I4891 Unspecified atrial fibrillation: Secondary | ICD-10-CM

## 2012-03-04 DIAGNOSIS — I079 Rheumatic tricuspid valve disease, unspecified: Secondary | ICD-10-CM

## 2012-03-04 NOTE — Assessment & Plan Note (Signed)
Rate controlled Goal INR 2-3

## 2012-03-04 NOTE — Assessment & Plan Note (Signed)
Echo reviewed with Dr Jens Som this am.  She has severe TR which is worse than 2011 echo report from [facility].  Her RV size and function remain preserved.  She appears to be asymptomatic at this time.  We will therefore continue watchful waiting and repeat an echo in 6 months. IF her RV function decreases or she develops symptoms of TR then we will proceed with further evaluation and possible surgical consultation at that point. She is clear that she would like to avoid surgery now.

## 2012-03-04 NOTE — Assessment & Plan Note (Signed)
Stable No change required today  

## 2012-03-04 NOTE — Progress Notes (Signed)
The patient presents today for routine cardiology followup.  Since last being seen in our clinic, the patient reports doing reasonably well. Her exercise tolerance is stable.  She reports occasional L axillary chest wall pain when laying in certain positions.  She denies exertional symptoms. Today, she denies symptoms of palpitations, shortness of breath, orthopnea, PND, lower extremity edema, dizziness , presyncope, syncope, or neurologic sequela.  The patient feels that she is tolerating medications without difficulties and is otherwise without complaint today.   Past Medical History  Diagnosis Date  . HTN (hypertension) 1983  . Persistent atrial fibrillation 2009  . Anxiety age 8  . Hyperlipidemia 2000  . DDD (degenerative disc disease)   . DJD (degenerative joint disease)   . Vertigo   . GERD (gastroesophageal reflux disease) 2003  . Osteoporosis 2001  . Hypothyroidism 2009  . Adenomatous colon polyp 2005  . Vitamin d deficiency 2011  . Gastroenteritis 2011  . History of pneumonia   . Iron deficiency anemia 1983  . Persistent headaches 1987  . Osteoarthritis 2001  . Carotid artery bruit 2001    left  . Tricuspid regurgitation     severe by echo 02/25/12   Past Surgical History  Procedure Date  . S/p blt   . Colonoscopy w/ biopsies and polypectomy 09/16/2004    adenomatous polyp, diverticulosis, internal hemorrhoids  . Tubal ligation     Current Outpatient Prescriptions  Medication Sig Dispense Refill  . Cholecalciferol (VITAMIN D3) 2000 UNITS TABS Take 1 tablet by mouth daily.      Marland Kitchen diltiazem (TIAZAC) 240 MG 24 hr capsule Take 240 mg by mouth daily.        Marland Kitchen levothyroxine (SYNTHROID, LEVOTHROID) 25 MCG tablet Take 25 mcg by mouth daily.        Marland Kitchen LORazepam (ATIVAN) 1 MG tablet Take 1 mg by mouth 3 (three) times daily.       . metoprolol (TOPROL-XL) 50 MG 24 hr tablet Take 50 mg by mouth 2 (two) times daily.        . simvastatin (ZOCOR) 40 MG tablet Take 10 mg by mouth at  bedtime.        . triamterene-hydrochlorothiazide (MAXZIDE-25) 37.5-25 MG per tablet Take 1 tablet by mouth daily.       Marland Kitchen warfarin (COUMADIN) 1 MG tablet 3 mg as directed. Use as directed        Allergies  Allergen Reactions  . Ace Inhibitors Cough  . Codeine Nausea Only  . Colestid Flavored     Malaise  . Doxycycline   . Lovastatin   . Meclizine     Tachycardia  . Pravastatin     Arthralgia, Myalgias  . Sulfa Antibiotics   . Zetia (Ezetimibe)     Arthralgia, Myalgias    History   Social History  . Marital Status: Widowed    Spouse Name: N/A    Number of Children: 7  . Years of Education: N/A   Occupational History  . Retired     [employer]   Social History Main Topics  . Smoking status: Never Smoker   . Smokeless tobacco: Never Used  . Alcohol Use: No  . Drug Use: No  . Sexually Active: Not on file   Other Topics Concern  . Not on file   Social History Narrative  . No narrative on file    Family History  Problem Relation Age of Onset  . Hypertension Father   . Diabetes Mother   .  Peripheral vascular disease Father   . Heart disease Brother 71  . Liver cancer Sister    Physical Exam: Filed Vitals:   03/04/12 0940  BP: 140/60  Pulse: 58  Resp: 18  Height: 6' (1.829 m)  Weight: 186 lb 6.4 oz (84.55 kg)    GEN- The patient is well appearing, alert and oriented x 3 today.   Head- normocephalic, atraumatic Eyes-  Sclera clear, conjunctiva pink Ears- hearing intact Oropharynx- clear Neck- supple, no JVP Lymph- no cervical lymphadenopathy Lungs- Clear to ausculation bilaterally, normal work of breathing Heart- irregular rate and rhythm GI- soft, NT, ND, + BS Extremities- no clubbing, cyanosis, or edema  Echo 02/25/12- preserved EF, mild LVH, severe TR, normal RV size/ function  Assessment and Plan:

## 2012-03-04 NOTE — Patient Instructions (Signed)
Your physician wants you to follow-up in: 6 months with Dr Jacquiline Doe will receive a reminder letter in the mail two months in advance. If you don't receive a letter, please call our office to schedule the follow-up appointment.   Your physician has requested that you have an echocardiogram. Echocardiography is a painless test that uses sound waves to create images of your heart. It provides your doctor with information about the size and shape of your heart and how well your heart's chambers and valves are working. This procedure takes approximately one hour. There are no restrictions for this procedure.--prior to appointment with Dr Johney Frame

## 2012-04-03 ENCOUNTER — Other Ambulatory Visit: Payer: Self-pay

## 2012-04-03 ENCOUNTER — Encounter (HOSPITAL_COMMUNITY): Payer: Self-pay | Admitting: *Deleted

## 2012-04-03 ENCOUNTER — Emergency Department (HOSPITAL_COMMUNITY)
Admission: EM | Admit: 2012-04-03 | Discharge: 2012-04-04 | Disposition: A | Discharge status: Home / Self Care | Payer: Medicare Other | Attending: Emergency Medicine | Admitting: Emergency Medicine

## 2012-04-03 DIAGNOSIS — E785 Hyperlipidemia, unspecified: Secondary | ICD-10-CM | POA: Insufficient documentation

## 2012-04-03 DIAGNOSIS — K219 Gastro-esophageal reflux disease without esophagitis: Secondary | ICD-10-CM | POA: Insufficient documentation

## 2012-04-03 DIAGNOSIS — Z79899 Other long term (current) drug therapy: Secondary | ICD-10-CM | POA: Insufficient documentation

## 2012-04-03 DIAGNOSIS — M1990 Unspecified osteoarthritis, unspecified site: Secondary | ICD-10-CM | POA: Insufficient documentation

## 2012-04-03 DIAGNOSIS — Z7901 Long term (current) use of anticoagulants: Secondary | ICD-10-CM | POA: Insufficient documentation

## 2012-04-03 DIAGNOSIS — I10 Essential (primary) hypertension: Secondary | ICD-10-CM | POA: Insufficient documentation

## 2012-04-03 DIAGNOSIS — I4891 Unspecified atrial fibrillation: Secondary | ICD-10-CM | POA: Insufficient documentation

## 2012-04-03 DIAGNOSIS — IMO0002 Reserved for concepts with insufficient information to code with codable children: Secondary | ICD-10-CM | POA: Insufficient documentation

## 2012-04-03 DIAGNOSIS — R42 Dizziness and giddiness: Secondary | ICD-10-CM

## 2012-04-03 DIAGNOSIS — M810 Age-related osteoporosis without current pathological fracture: Secondary | ICD-10-CM | POA: Insufficient documentation

## 2012-04-03 DIAGNOSIS — F411 Generalized anxiety disorder: Secondary | ICD-10-CM | POA: Insufficient documentation

## 2012-04-03 DIAGNOSIS — E039 Hypothyroidism, unspecified: Secondary | ICD-10-CM | POA: Insufficient documentation

## 2012-04-03 DIAGNOSIS — E559 Vitamin D deficiency, unspecified: Secondary | ICD-10-CM | POA: Insufficient documentation

## 2012-04-03 LAB — PROTIME-INR: Prothrombin Time: 35.4 seconds — ABNORMAL HIGH (ref 11.6–15.2)

## 2012-04-03 LAB — POCT I-STAT, CHEM 8
Calcium, Ion: 1.33 mmol/L — ABNORMAL HIGH (ref 1.12–1.32)
Glucose, Bld: 113 mg/dL — ABNORMAL HIGH (ref 70–99)
HCT: 50 % — ABNORMAL HIGH (ref 36.0–46.0)
Hemoglobin: 17 g/dL — ABNORMAL HIGH (ref 12.0–15.0)
Potassium: 4.4 mEq/L (ref 3.5–5.1)
TCO2: 28 mmol/L (ref 0–100)

## 2012-04-03 LAB — URINALYSIS, ROUTINE W REFLEX MICROSCOPIC
Bilirubin Urine: NEGATIVE
Glucose, UA: NEGATIVE mg/dL
Glucose, UA: NEGATIVE mg/dL
Ketones, ur: NEGATIVE mg/dL
Leukocytes, UA: NEGATIVE
Nitrite: POSITIVE — AB
Nitrite: NEGATIVE
Protein, ur: NEGATIVE mg/dL
Protein, ur: NEGATIVE mg/dL
Specific Gravity, Urine: 1.019 (ref 1.005–1.030)
Urobilinogen, UA: 0.2 mg/dL (ref 0.0–1.0)
Urobilinogen, UA: 0.2 mg/dL (ref 0.0–1.0)
pH: 5.5 (ref 5.0–8.0)

## 2012-04-03 LAB — URINE MICROSCOPIC-ADD ON

## 2012-04-03 MED ORDER — LORAZEPAM 1 MG PO TABS
0.5000 mg | ORAL_TABLET | Freq: Once | ORAL | Status: AC
  Administered 2012-04-03: 0.5 mg via ORAL
  Filled 2012-04-03: qty 1

## 2012-04-03 NOTE — ED Notes (Signed)
PT ambulated without difficulty to bathroom and back to stretcher approximately 20 yards

## 2012-04-03 NOTE — ED Provider Notes (Signed)
History     CSN: [id number]  Arrival date & time 04/03/12  4098   First MD Initiated Contact with Patient 04/03/12 2127      Chief Complaint  Patient presents with  . Dizziness    (Consider location/radiation/quality/duration/timing/severity/associated sxs/prior treatment) HPI Hx from patient. 78 year old female with past medical history of hypertension, atrial fibrillation who presents with dizziness. She states this started this afternoon around 5 PM. She is unable to describe the sensation she is experiencing. Dizziness worsens with walking. It improves if she stays still. No treatment prior to arrival.  She has had no associated chest pain, shortness of breath, diaphoresis, nausea, or vomiting. She has not had any facial droop, change in gait, change in speech, or any change in her mental status per her daughter. Denies any tinnitus, sensation of ear fullness. She has had this before and was treated for vertigo with meclizine. She states that she did not do well with this as it made her feel nauseous.  Past Medical History  Diagnosis Date  . HTN (hypertension) 1983  . Persistent atrial fibrillation 2009  . Anxiety age 3  . Hyperlipidemia 2000  . DDD (degenerative disc disease)   . DJD (degenerative joint disease)   . Vertigo   . GERD (gastroesophageal reflux disease) 2003  . Osteoporosis 2001  . Hypothyroidism 2009  . Adenomatous colon polyp 2005  . Vitamin d deficiency 2011  . Gastroenteritis 2011  . History of pneumonia   . Iron deficiency anemia 1983  . Persistent headaches 1987  . Osteoarthritis 2001  . Carotid artery bruit 2001    left  . Tricuspid regurgitation     severe by echo 02/25/12    Past Surgical History  Procedure Date  . S/p blt   . Colonoscopy w/ biopsies and polypectomy 09/16/2004    adenomatous polyp, diverticulosis, internal hemorrhoids  . Tubal ligation     Family History  Problem Relation Age of Onset  . Hypertension Father   . Diabetes  Mother   . Peripheral vascular disease Father   . Heart disease Brother 80  . Liver cancer Sister     History  Substance Use Topics  . Smoking status: Never Smoker   . Smokeless tobacco: Never Used  . Alcohol Use: No    OB History    Grav Para Term Preterm Abortions TAB SAB Ect Mult Living                  Review of Systems  Constitutional: Negative for fever, chills, activity change, appetite change and unexpected weight change.  HENT: Negative for congestion, sore throat, trouble swallowing, neck pain, voice change and sinus pressure.   Eyes: Negative for visual disturbance.  Respiratory: Negative for cough, chest tightness, shortness of breath and wheezing.   Cardiovascular: Negative for chest pain and palpitations.  Gastrointestinal: Negative for nausea, vomiting, abdominal pain and diarrhea.  Genitourinary: Negative for dysuria and decreased urine volume.  Musculoskeletal: Negative for myalgias.  Skin: Negative for color change and rash.  Neurological: Negative for dizziness, syncope, facial asymmetry, speech difficulty, weakness, light-headedness, numbness and headaches.    Allergies  Ace inhibitors; Codeine; Colestid flavored; Doxycycline; Lovastatin; Meclizine; Pravastatin; Sulfa antibiotics; and Zetia  Home Medications   Current Outpatient Rx  Name Route Sig Dispense Refill  . VITAMIN D3 2000 UNITS PO TABS Oral Take 1 tablet by mouth daily.    Marland Kitchen DILTIAZEM HCL ER BEADS 240 MG PO CP24 Oral Take 240 mg by mouth  daily.      Marland Kitchen LEVOTHYROXINE SODIUM 25 MCG PO TABS Oral Take 25 mcg by mouth daily.      Marland Kitchen METOPROLOL SUCCINATE ER 50 MG PO TB24 Oral Take 50 mg by mouth 2 (two) times daily.      Marland Kitchen SIMVASTATIN 40 MG PO TABS Oral Take 20 mg by mouth at bedtime.     . TRIAMTERENE-HCTZ 37.5-25 MG PO TABS Oral Take 1 tablet by mouth daily.     . WARFARIN SODIUM 1 MG PO TABS Oral Take 2 mg by mouth See admin instructions. On Monday, Thursday, and Saturday      BP 137/89  Pulse  90  Temp(Src) 98.2 F (36.8 C) (Oral)  Resp 18  SpO2 96%  Physical Exam  Nursing note and vitals reviewed. Constitutional: She is oriented to person, place, and time. She appears well-developed and well-nourished. No distress.  HENT:  Head: Normocephalic and atraumatic.  Eyes: EOM are normal. Pupils are equal, round, and reactive to light.  Neck: Normal range of motion.  Cardiovascular: Normal rate, regular rhythm and normal heart sounds.  Exam reveals no gallop and no friction rub.   No murmur heard. Pulmonary/Chest: Effort normal and breath sounds normal. She exhibits no tenderness.  Abdominal: Soft. Bowel sounds are normal. There is no tenderness. There is no rebound and no guarding.  Musculoskeletal: Normal range of motion.  Neurological: She is alert and oriented to person, place, and time. No cranial nerve deficit. She exhibits normal muscle tone. Coordination normal.       No facial droop, negative pronator drift, coordination as assessed with rapid alternating movements intact. Noted to be slightly ataxic on ambulation.  Skin: Skin is warm and dry. No rash noted. She is not diaphoretic.  Psychiatric: She has a normal mood and affect.    ED Course  Procedures (including critical care time)   Date: 04/03/2012  Rate: 89  Rhythm: atrial fibrillation  QRS Axis: left  Intervals: .  ST/T Wave abnormalities: nonspecific ST changes  Conduction Disutrbances:none  Narrative Interpretation: afib with competing junctional pacemaker  Old EKG Reviewed: as compared with Aug 26, 2010, rate increased    Labs Reviewed  URINALYSIS, ROUTINE W REFLEX MICROSCOPIC - Abnormal; Notable for the following:    APPearance CLOUDY (*)    Hgb urine dipstick TRACE (*)    Nitrite POSITIVE (*)    Leukocytes, UA MODERATE (*)    All other components within normal limits  URINE MICROSCOPIC-ADD ON - Abnormal; Notable for the following:    Squamous Epithelial / LPF MANY (*)    Bacteria, UA MANY (*)      All other components within normal limits  PROTIME-INR - Abnormal; Notable for the following:    Prothrombin Time 35.4 (*)    INR 3.47 (*)    All other components within normal limits  POCT I-STAT, CHEM 8 - Abnormal; Notable for the following:    BUN 30 (*)    Creatinine, Ser 1.30 (*)    Glucose, Bld 113 (*)    Calcium, Ion 1.33 (*)    Hemoglobin 17.0 (*)    HCT 50.0 (*)    All other components within normal limits  URINALYSIS, ROUTINE W REFLEX MICROSCOPIC  LAB REPORT - SCANNED   No results found.   1. Dizziness       MDM  9:40 PM Pt assessed. Lab investigation initiated. ECG with evidence of afib, rate controlled. Case discussed with Dr. Ethelda Chick, who saw pt with  me. She has had identical symptoms in the past and was treated with meclizine at that time; states she didn't tolerate the medication well. She does not seem to have any neurologic deficits on exam and does not complain of chest pain/dyspnea. Her EKG is unremarkable for acute changes. Plan to obtain basic labs including electrolytes and PT/INR. Will treat with 0.5 mg of Ativan. Based on normal exam, with the exception of dizziness on ambulation, we do not feel that she needs imaging of her brain at this point.  12:00 AM Patient's labs are largely unremarkable. She is slightly supratherapeutic on her Coumadin. INR is 3.5. She was instructed to hold her dose tomorrow and call the Coumadin clinic on Monday for a recheck. Initial urine sample, which was clean catch, was contaminated. A repeat in and out sample was normal. She was given 0.5 mg of Ativan here which she stated helped her symptoms. She was ambulated after receiving this, and did well with this without worsening of her symptoms. We'll plan to discharge with a prescription for the same and instructions to followup with her primary care physician. We discussed reasons to return to the ED. Patient and daughter verbalized understanding and were agreeable to  plan.      Grant Fontana, [place] 04/04/12 0707  Grant Fontana, PA 04/04/12 [phone]

## 2012-04-03 NOTE — ED Provider Notes (Signed)
Complains of feeling off balance onset this morning. Feels as if the room is moving his unsteady on her feet. Has been treated in the past for same complaint with meclizine with relief however "meclizine makes me sick" symptoms last for several seconds worse changing position improved with remaining still. No headache no slurred speech no visual changes no other complaint on exam alert awake Glasgow Coma Score 15 moves all extremities well cranial nerves II through XII grossly intact no facial asymmetry bilateral tympanic membranes normal she is unsteady on her feet on getting up from bed and becomes vertiginous  Doug Sou, MD 04/03/12 2206

## 2012-04-03 NOTE — ED Notes (Signed)
I gave the patient a warm blanket. 

## 2012-04-03 NOTE — ED Notes (Signed)
The pt has been dizzy since this am.  She saw her medical doctor yesterday for something different.  No n or v

## 2012-04-04 MED ORDER — LORAZEPAM 1 MG PO TABS: 0.5000 mg | ORAL_TABLET | Freq: Three times a day (TID) | ORAL | Status: DC | PRN

## 2012-04-04 NOTE — ED Notes (Signed)
PT states understanding of discharge instructions 

## 2012-04-04 NOTE — ED Provider Notes (Signed)
Medical screening examination/treatment/procedure(s) were conducted as a shared visit with non-physician practitioner(s) and myself.  I personally evaluated the patient during the encounter  Doug Sou, MD 04/04/12 1457

## 2012-04-04 NOTE — Discharge Instructions (Signed)
You have been treated for dizziness here today. Your labs looked normal with the exception of your Coumadin level. This is too high at 3.47 (goal 2 to 3). This means your blood is a little too thin. Do not take your Coumadin dose tomorrow. You need to have your Coumadin level redrawn at the clinic on Monday to make sure it is okay.   You have been given a prescription for Ativan to treat your dizziness. You may take half of a tablet three times a day for the dizziness.  If you are having chest pain, shortness of breath, or any other worrisome symptoms, please return to the ER immediately.  RESOURCE GUIDE  Dental Problems  Patients with Medicaid: [facility] [phone]                                           [phone] [address] Phone:  [phone]                                                  Phone:  [phone]  If unable to pay or uninsured, contact:  [facility] or [facility]. to become qualified for the adult dental clinic.  Chronic Pain Problems Contact [facility]  [phone] Patients need to be referred by their primary care doctor.  Insufficient Money for Medicine Contact [employer] Way:  call "211" or [facility] 438-750-7596.  No Primary Care Doctor Call [facility]  3021305998 Other agencies that provide inexpensive medical care    [facility]  [phone]    [facility]  [phone]    [facility]  [phone]    [facility]  [phone]    [facility]  [phone]    [facility]  [phone]  Psychological Services [facility]  [phone] [facility] Services  [phone] [facility] Mental [facility]   [phone] (emergency services [phone])  Substance Abuse Resources [facility]  8568200139 [facility] [phone] [facility] [place] [phone] Floydene Flock  [phone] Residential & Outpatient Substance Abuse Program  [phone]  Abuse/Neglect [facility] Child Abuse Hotline 682-756-5839 [facility] Child Abuse Hotline [phone] (After Hours)  Emergency Shelter [facility] [phone]  Maternity Homes [facility] [place] [facility] [phone] [facility] [phone]  MRSA Hotline #:   (480)591-2038    [facility] Resources  [facility]     [employer]                          [facility] Dept. [address] [place]                       [address]      [address]  [address]  Cristobal Goldmann Phone:  [phone]                                   Phone:  [phone]                 Phone:  [phone]  [facility] Mental Health Phone:  [phone]  [facility] Child Abuse Hotline 9102436291 [phone]) [phone] (After Hours)  Dizziness Dizziness is a common problem. It is a feeling of unsteadiness or lightheadedness. You may feel like you are about to faint. Dizziness can lead to injury if you stumble or fall. A person of any age group can suffer from dizziness, but dizziness is more common in older adults. CAUSES  Dizziness can be caused by many different things, including:  Middle ear problems.   Standing for too long.   Infections.   An allergic reaction.   Aging.   An emotional response to something, such as the sight of blood.   Side effects of medicines.   Fatigue.   Problems with circulation or blood pressure.   Excess use of alcohol, medicines, or illegal drug use.   Breathing too fast (hyperventilation).   An arrhythmia or problems with your heart rhythm.   Low red blood cell count (anemia).   Pregnancy.   Vomiting, diarrhea, fever, or other illnesses that cause dehydration.   Diseases or conditions such as  Parkinson's disease, high blood pressure (hypertension), diabetes, and thyroid problems.   Exposure to extreme heat.  DIAGNOSIS  To find the cause of your dizziness, your caregiver may do a physical exam, lab tests, radiologic imaging scans, or an electrocardiography test (ECG).  TREATMENT  Treatment of dizziness depends on the cause of your symptoms and can vary greatly. HOME CARE INSTRUCTIONS   Drink enough fluids to keep your urine clear or pale yellow. This is especially important in very hot weather. In the elderly, it is also important in cold weather.   If your dizziness is caused by medicines, take them exactly as directed. When taking blood pressure medicines, it is especially important to get up slowly.   Rise slowly from chairs and steady yourself until you feel okay.   In the morning, first sit up on the side of the bed. When this seems okay, stand slowly while holding onto something until you know your balance is fine.   If you need to stand in one place for a long time, be sure to move your legs often. Tighten and relax the muscles in your legs while standing.   If dizziness continues to be a problem, have someone stay with you for a day or two. Do this until you feel you are well enough to stay alone. Have the person call your caregiver if he or she notices changes in you that are concerning.   Do not drive or use heavy machinery if you feel dizzy.  SEEK IMMEDIATE MEDICAL CARE IF:   Your dizziness or lightheadedness gets worse.   You feel nauseous or vomit.   You develop problems with talking, walking, weakness, or using your arms, hands, or legs.   You are not thinking clearly or you have difficulty forming sentences. It may take a friend or family member to determine if your thinking is  normal.   You develop chest pain, abdominal pain, shortness of breath, or sweating.   Your vision changes.   You notice any bleeding.   You have side effects from medicine that  seems to be getting worse rather than better.  MAKE SURE YOU:   Understand these instructions.   Will watch your condition.   Will get help right away if you are not doing well or get worse.  Document Released: 06/10/2001 Document Revised: 12/04/2011 Document Reviewed: 07/04/2011 [facility] Patient Information 2012 [place], [place].

## 2012-04-23 ENCOUNTER — Other Ambulatory Visit: Payer: Self-pay | Admitting: Family Medicine

## 2012-04-30 ENCOUNTER — Ambulatory Visit
Admission: RE | Admit: 2012-04-30 | Discharge: 2012-04-30 | Disposition: A | Discharge status: Home / Self Care | Payer: Medicare Other | Source: Ambulatory Visit | Attending: Family Medicine | Admitting: Family Medicine

## 2012-06-14 ENCOUNTER — Ambulatory Visit
Admission: RE | Admit: 2012-06-14 | Discharge: 2012-06-14 | Disposition: A | Discharge status: Home / Self Care | Payer: Medicare Other | Source: Ambulatory Visit | Attending: Family Medicine | Admitting: Family Medicine

## 2012-06-14 ENCOUNTER — Other Ambulatory Visit: Payer: Self-pay | Admitting: Family Medicine

## 2012-06-14 DIAGNOSIS — R52 Pain, unspecified: Secondary | ICD-10-CM

## 2012-08-23 ENCOUNTER — Ambulatory Visit (INDEPENDENT_AMBULATORY_CARE_PROVIDER_SITE_OTHER): Payer: Medicare Other | Admitting: Internal Medicine

## 2012-08-23 ENCOUNTER — Encounter: Payer: Self-pay | Admitting: Internal Medicine

## 2012-08-23 VITALS — BP 127/78 | HR 74 | Ht 66.0 in | Wt 192.0 lb

## 2012-08-23 DIAGNOSIS — I10 Essential (primary) hypertension: Secondary | ICD-10-CM

## 2012-08-23 DIAGNOSIS — I079 Rheumatic tricuspid valve disease, unspecified: Secondary | ICD-10-CM

## 2012-08-23 DIAGNOSIS — I071 Rheumatic tricuspid insufficiency: Secondary | ICD-10-CM

## 2012-08-23 DIAGNOSIS — I4891 Unspecified atrial fibrillation: Secondary | ICD-10-CM

## 2012-08-23 NOTE — Progress Notes (Signed)
PCP: Carolyne Fiscal, MD  The patient presents today for routine cardiology followup.  Since last being seen in our clinic, the patient reports doing reasonably well. Her exercise tolerance is stable.  She reports occasional L chest wall soreness since her recent mammogram.  She denies exertional symptoms. Today, she denies symptoms of palpitations, shortness of breath, orthopnea, PND, lower extremity edema, dizziness , presyncope, syncope, or neurologic sequela.  The patient feels that she is tolerating medications without difficulties and is otherwise without complaint today.   Past Medical History  Diagnosis Date  . HTN (hypertension) 1983  . Persistent atrial fibrillation 2009  . Anxiety age 34  . Hyperlipidemia 2000  . DDD (degenerative disc disease)   . DJD (degenerative joint disease)   . Vertigo   . GERD (gastroesophageal reflux disease) 2003  . Osteoporosis 2001  . Hypothyroidism 2009  . Adenomatous colon polyp 2005  . Vitamin d deficiency 2011  . Gastroenteritis 2011  . History of pneumonia   . Iron deficiency anemia 1983  . Persistent headaches 1987  . Osteoarthritis 2001  . Carotid artery bruit 2001    left  . Tricuspid regurgitation     severe by echo 02/25/12   Past Surgical History  Procedure Date  . S/p blt   . Colonoscopy w/ biopsies and polypectomy 09/16/2004    adenomatous polyp, diverticulosis, internal hemorrhoids  . Tubal ligation     Current Outpatient Prescriptions  Medication Sig Dispense Refill  . Cholecalciferol (VITAMIN D3) 2000 UNITS TABS Take 1 tablet by mouth daily.      Marland Kitchen diltiazem (TIAZAC) 240 MG 24 hr capsule Take 240 mg by mouth daily.        Marland Kitchen levothyroxine (SYNTHROID, LEVOTHROID) 25 MCG tablet Take 25 mcg by mouth daily.        Marland Kitchen LORazepam (ATIVAN) 1 MG tablet Take 1 mg by mouth every 8 (eight) hours.      . metoprolol (TOPROL-XL) 50 MG 24 hr tablet Take 50 mg by mouth 2 (two) times daily.        Marland Kitchen omeprazole (PRILOSEC) 20 MG capsule Take  20 mg by mouth daily.      . simvastatin (ZOCOR) 40 MG tablet Take 20 mg by mouth at bedtime.       . triamterene-hydrochlorothiazide (MAXZIDE-25) 37.5-25 MG per tablet Take 1 tablet by mouth daily.       Marland Kitchen warfarin (COUMADIN) 1 MG tablet Take 2 mg by mouth See admin instructions. On Monday, Thursday, and Saturday        Allergies  Allergen Reactions  . Ace Inhibitors Cough  . Codeine Nausea Only  . Colestipol Hcl Other (See Comments)    Malaise  . Doxycycline Other (See Comments)  . Lovastatin Other (See Comments)  . Meclizine Other (See Comments)    Tachycardia  . Pravastatin Other (See Comments)    Arthralgia, Myalgias  . Sulfa Antibiotics Other (See Comments)    unknown  . Zetia (Ezetimibe) Other (See Comments)    Arthralgia, Myalgias    History   Social History  . Marital Status: Widowed    Spouse Name: N/A    Number of Children: 7  . Years of Education: N/A   Occupational History  . Retired     [employer]   Social History Main Topics  . Smoking status: Never Smoker   . Smokeless tobacco: Never Used  . Alcohol Use: No  . Drug Use: No  . Sexually Active: Not on file  Other Topics Concern  . Not on file   Social History Narrative  . No narrative on file    Family History  Problem Relation Age of Onset  . Hypertension Father   . Diabetes Mother   . Peripheral vascular disease Father   . Heart disease Brother 46  . Liver cancer Sister    Physical Exam: Filed Vitals:   08/23/12 1053  BP: 127/78  Pulse: 74  Height: 5\' 6"  (1.676 m)  Weight: 192 lb (87.091 kg)    GEN- The patient is well appearing, alert and oriented x 3 today.   Head- normocephalic, atraumatic Eyes-  Sclera clear, conjunctiva pink Ears- hearing intact Oropharynx- clear Neck- supple, no JVP Lymph- no cervical lymphadenopathy Lungs- Clear to ausculation bilaterally, normal work of breathing Heart- irregular rate and rhythm GI- soft, NT, ND, + BS Extremities- no clubbing,  cyanosis, or edema  Echo 02/25/12- preserved EF, mild LVH, severe TR, normal RV size/ function Ekg today reveals afib, V rate 74, no ischemic changes, PVC  Assessment and Plan:

## 2012-08-23 NOTE — Assessment & Plan Note (Signed)
Stable No change required today  

## 2012-08-23 NOTE — Assessment & Plan Note (Signed)
Repeat echo shceduled for next week.  IF her RV function decreases or she develops symptoms of TR then we will proceed with further evaluation and possible surgical consultation at that point.  She is clear that she would like to avoid surgery now.

## 2012-08-23 NOTE — Patient Instructions (Addendum)
Your physician wants you to follow-up in: 6 MONTHS WITH Norma Fredrickson NP You will receive a reminder letter in the mail two months in advance. If you don't receive a letter, please call our office to schedule the follow-up appointment.   Your physician wants you to follow-up in: ONE YEAR WITH DR Jacquiline Doe will receive a reminder letter in the mail two months in advance. If you don't receive a letter, please call our office to schedule the follow-up appointment.

## 2012-09-01 ENCOUNTER — Ambulatory Visit (HOSPITAL_COMMUNITY): Payer: Medicare Other | Attending: Cardiovascular Disease

## 2012-09-01 DIAGNOSIS — I10 Essential (primary) hypertension: Secondary | ICD-10-CM | POA: Insufficient documentation

## 2012-09-01 DIAGNOSIS — I080 Rheumatic disorders of both mitral and aortic valves: Secondary | ICD-10-CM | POA: Insufficient documentation

## 2012-09-01 DIAGNOSIS — I379 Nonrheumatic pulmonary valve disorder, unspecified: Secondary | ICD-10-CM | POA: Insufficient documentation

## 2012-09-01 DIAGNOSIS — I071 Rheumatic tricuspid insufficiency: Secondary | ICD-10-CM

## 2012-09-01 DIAGNOSIS — I369 Nonrheumatic tricuspid valve disorder, unspecified: Secondary | ICD-10-CM

## 2012-09-01 DIAGNOSIS — I079 Rheumatic tricuspid valve disease, unspecified: Secondary | ICD-10-CM | POA: Insufficient documentation

## 2012-09-01 DIAGNOSIS — I4891 Unspecified atrial fibrillation: Secondary | ICD-10-CM | POA: Insufficient documentation

## 2012-09-01 NOTE — Progress Notes (Signed)
Echocardiogram performed.  

## 2012-09-29 ENCOUNTER — Other Ambulatory Visit: Payer: Self-pay | Admitting: Family Medicine

## 2012-09-29 ENCOUNTER — Ambulatory Visit
Admission: RE | Admit: 2012-09-29 | Discharge: 2012-09-29 | Disposition: A | Discharge status: Home / Self Care | Payer: Medicare Other | Source: Ambulatory Visit | Attending: Family Medicine | Admitting: Family Medicine

## 2012-09-29 DIAGNOSIS — M545 Low back pain: Secondary | ICD-10-CM

## 2012-12-06 ENCOUNTER — Other Ambulatory Visit: Payer: Self-pay | Admitting: Family Medicine

## 2012-12-06 ENCOUNTER — Ambulatory Visit
Admission: RE | Admit: 2012-12-06 | Discharge: 2012-12-06 | Disposition: A | Discharge status: Home / Self Care | Payer: Medicare Other | Source: Ambulatory Visit | Attending: Family Medicine | Admitting: Family Medicine

## 2012-12-06 DIAGNOSIS — R0789 Other chest pain: Secondary | ICD-10-CM

## 2012-12-13 ENCOUNTER — Encounter: Payer: Self-pay | Admitting: Internal Medicine

## 2012-12-20 ENCOUNTER — Emergency Department (INDEPENDENT_AMBULATORY_CARE_PROVIDER_SITE_OTHER): Payer: Medicare Other

## 2012-12-20 ENCOUNTER — Emergency Department (INDEPENDENT_AMBULATORY_CARE_PROVIDER_SITE_OTHER)
Admission: EM | Admit: 2012-12-20 | Discharge: 2012-12-20 | Disposition: A | Discharge status: Home / Self Care | Payer: Medicare Other | Source: Home / Self Care | Attending: Emergency Medicine | Admitting: Emergency Medicine

## 2012-12-20 ENCOUNTER — Encounter (HOSPITAL_COMMUNITY): Payer: Self-pay | Admitting: Emergency Medicine

## 2012-12-20 DIAGNOSIS — M109 Gout, unspecified: Secondary | ICD-10-CM

## 2012-12-20 MED ORDER — COLCHICINE 0.6 MG PO TABS: ORAL_TABLET | ORAL | Status: DC

## 2012-12-20 MED ORDER — PREDNISONE 5 MG PO KIT: 1.0000 | PACK | Freq: Every day | ORAL | Status: DC

## 2012-12-20 NOTE — ED Provider Notes (Signed)
Chief Complaint  Patient presents with  . Foot Pain    History of Present Illness:   Lori Bridges is an 78 year old female who has had a five-day history of pain and swelling over the MTP joint of the right great toe. She denies any injury to the area. She's never had any thing like this before. No history of gout. No recent dietary indiscretion. She denies fever, chills, or other joint pains.  Review of Systems:  Other than noted above, the patient denies any of the following symptoms: Systemic:  No fevers, chills, sweats, or aches.  No fatigue or tiredness. Musculoskeletal:  No joint pain, arthritis, bursitis, swelling, back pain, or neck pain. Neurological:  No muscular weakness, paresthesias, headache, or trouble with speech or coordination.  No dizziness.  PMFSH:  Past medical history, family history, social history, meds, and allergies were reviewed.  Physical Exam:   Vital signs:  BP 150/93  Pulse 84  Temp 98.2 F (36.8 C) (Oral)  Resp 18  SpO2 94% Gen:  Alert and oriented times 3.  In no distress. Musculoskeletal: There was swelling, erythema, warmth, and tenderness to palpation over the MTP joint of right great toe extending to the second and third toes as well. There was no pain over the ankle or the dorsum of foot. Otherwise, all joints had a full a ROM with no swelling, bruising or deformity.  No edema, pulses full. Extremities were warm and pink.  Capillary refill was brisk.  Skin:  Clear, warm and dry.  No rash. Neuro:  Alert and oriented times 3.  Muscle strength was normal.  Sensation was intact to light touch.   Results for orders placed during the hospital encounter of 12/20/12  URIC ACID      Component Value Range   Uric Acid, Serum 8.0 (*) 2.4 - 7.0 mg/dL   Radiology:  Dg Foot Complete Right  12/20/2012  *RADIOLOGY REPORT*  Clinical Data: Right foot pain and swelling.  No injury  RIGHT FOOT COMPLETE - 3+ VIEW  Comparison: None  Findings: Normal alignment.  Negative for  fracture.  Mild degenerative change in the first metatarsal phalangeal joint.  No erosion or osteomyelitis.  IMPRESSION: Mild degenerative change in the first metatarsal phalangeal joint.   Original Report Authenticated By: Janeece Riggers, M.D.    I reviewed the images independently and personally and concur with the radiologist's findings.  Assessment:  The encounter diagnosis was Gout.  Plan:   1.  The following meds were prescribed:   New Prescriptions   COLCHICINE 0.6 MG TABLET    Take 2 now and 1 in 1 hour.  May repeat dose once daily.  For gout attack.   PREDNISONE 5 MG KIT    Take 1 kit (5 mg total) by mouth daily after breakfast. Prednisone 5 mg 6 day dosepack.  Take as directed.   2.  The patient was instructed in symptomatic care, including rest and activity, elevation, application of ice and compression.  Appropriate handouts were given. 3.  The patient was told to return if becoming worse in any way, if no better in 3 or 4 days, and given some red flag symptoms that would indicate earlier return.   4.  The patient was told to follow up with her primary care physician next week.    Reuben Likes, MD 12/20/12 2123

## 2012-12-20 NOTE — ED Notes (Signed)
Reports right foot.  Denies injury.  States pain started last Thursday.  Patient did soak foot in salt and peroxide.  States the foot has a throb to it.

## 2012-12-23 NOTE — ED Notes (Signed)
Uric Acid 8.0.  Lab reviewed by Dr. Lorenz Coaster while pt. was here. Pt. was treated with Colchicine and Prednisone.  No further action needed. Vassie Moselle 12/23/2012

## 2013-03-30 LAB — PROTIME-INR

## 2013-05-17 ENCOUNTER — Ambulatory Visit (INDEPENDENT_AMBULATORY_CARE_PROVIDER_SITE_OTHER): Payer: Medicare Other | Admitting: *Deleted

## 2013-05-17 DIAGNOSIS — I4891 Unspecified atrial fibrillation: Secondary | ICD-10-CM

## 2013-05-17 DIAGNOSIS — Z7901 Long term (current) use of anticoagulants: Secondary | ICD-10-CM | POA: Insufficient documentation

## 2013-05-17 LAB — POCT INR: INR: 2

## 2013-05-17 NOTE — Patient Instructions (Signed)

## 2013-06-14 ENCOUNTER — Ambulatory Visit (INDEPENDENT_AMBULATORY_CARE_PROVIDER_SITE_OTHER): Payer: Medicare Other | Admitting: *Deleted

## 2013-06-14 DIAGNOSIS — Z7901 Long term (current) use of anticoagulants: Secondary | ICD-10-CM

## 2013-06-14 DIAGNOSIS — I4891 Unspecified atrial fibrillation: Secondary | ICD-10-CM

## 2013-07-12 ENCOUNTER — Ambulatory Visit (INDEPENDENT_AMBULATORY_CARE_PROVIDER_SITE_OTHER): Payer: Medicare Other | Admitting: *Deleted

## 2013-07-12 DIAGNOSIS — I4891 Unspecified atrial fibrillation: Secondary | ICD-10-CM

## 2013-07-12 DIAGNOSIS — Z7901 Long term (current) use of anticoagulants: Secondary | ICD-10-CM

## 2013-08-09 ENCOUNTER — Ambulatory Visit (INDEPENDENT_AMBULATORY_CARE_PROVIDER_SITE_OTHER): Payer: Medicare Other | Admitting: Pharmacist

## 2013-08-09 DIAGNOSIS — Z7901 Long term (current) use of anticoagulants: Secondary | ICD-10-CM

## 2013-08-09 DIAGNOSIS — I4891 Unspecified atrial fibrillation: Secondary | ICD-10-CM

## 2013-08-09 LAB — POCT INR: INR: 2.2

## 2013-08-09 MED ORDER — WARFARIN SODIUM 2 MG PO TABS: 2.0000 mg | ORAL_TABLET | ORAL | Status: DC

## 2013-08-24 ENCOUNTER — Encounter: Payer: Self-pay | Admitting: Internal Medicine

## 2013-08-24 ENCOUNTER — Ambulatory Visit (INDEPENDENT_AMBULATORY_CARE_PROVIDER_SITE_OTHER): Payer: Medicare Other | Admitting: Internal Medicine

## 2013-08-24 VITALS — BP 138/90 | HR 85 | Ht 66.0 in | Wt 190.0 lb

## 2013-08-24 DIAGNOSIS — I4891 Unspecified atrial fibrillation: Secondary | ICD-10-CM

## 2013-08-24 DIAGNOSIS — I071 Rheumatic tricuspid insufficiency: Secondary | ICD-10-CM

## 2013-08-24 DIAGNOSIS — E785 Hyperlipidemia, unspecified: Secondary | ICD-10-CM

## 2013-08-24 DIAGNOSIS — I079 Rheumatic tricuspid valve disease, unspecified: Secondary | ICD-10-CM

## 2013-08-24 DIAGNOSIS — R0602 Shortness of breath: Secondary | ICD-10-CM

## 2013-08-24 DIAGNOSIS — I10 Essential (primary) hypertension: Secondary | ICD-10-CM

## 2013-08-24 NOTE — Patient Instructions (Addendum)
Your physician has requested that you have an echocardiogram. Echocardiography is a painless test that uses sound waves to create images of your heart. It provides your doctor with information about the size and shape of your heart and how well your heart's chambers and valves are working. This procedure takes approximately one hour. There are no restrictions for this procedure.  Your physician wants you to follow-up in: 6 months with Tereso Newcomer PA-C and in 1 year with Dr Johney Frame. You will receive a reminder letter in the mail two months in advance. If you don't receive a letter, please call our office to schedule the follow-up appointment.

## 2013-08-24 NOTE — Progress Notes (Signed)
PCP: Carolyne Fiscal, MD  The patient presents today for routine cardiology followup.  Since last being seen in our clinic, the patient reports doing reasonably well. Her exercise tolerance is stable.  She reports occasional L chest wall soreness which is unchanged.  She denies exertional symptoms. Today, she denies symptoms of palpitations, shortness of breath, orthopnea, PND, lower extremity edema, syncope, or neurologic sequela.  The patient feels that she is tolerating medications without difficulties and is otherwise without complaint today.   Past Medical History  Diagnosis Date  . HTN (hypertension) 1983  . Persistent atrial fibrillation 2009  . Anxiety age 60  . Hyperlipidemia 2000  . DDD (degenerative disc disease)   . DJD (degenerative joint disease)   . Vertigo   . GERD (gastroesophageal reflux disease) 2003  . Osteoporosis 2001  . Hypothyroidism 2009  . Adenomatous colon polyp 2005  . Vitamin D deficiency 2011  . Gastroenteritis 2011  . History of pneumonia   . Iron deficiency anemia 1983  . Persistent headaches 1987  . Osteoarthritis 2001  . Carotid artery bruit 2001    left  . Tricuspid regurgitation     severe by echo 02/25/12   Past Surgical History  Procedure Laterality Date  . S/p blt    . Colonoscopy w/ biopsies and polypectomy  09/16/2004    adenomatous polyp, diverticulosis, internal hemorrhoids  . Tubal ligation      Current Outpatient Prescriptions  Medication Sig Dispense Refill  . Cholecalciferol (VITAMIN D3) 2000 UNITS TABS Take 1 tablet by mouth daily.      Marland Kitchen diltiazem (TIAZAC) 240 MG 24 hr capsule Take 240 mg by mouth daily.        Marland Kitchen levothyroxine (SYNTHROID, LEVOTHROID) 25 MCG tablet Take 25 mcg by mouth daily.        Marland Kitchen LORazepam (ATIVAN) 1 MG tablet Take 1 mg by mouth every 8 (eight) hours.      . metoprolol (TOPROL-XL) 50 MG 24 hr tablet Take 50 mg by mouth 2 (two) times daily.        Marland Kitchen omeprazole (PRILOSEC) 20 MG capsule Take 20 mg by mouth  daily.      . simvastatin (ZOCOR) 40 MG tablet Take 20 mg by mouth at bedtime.       . triamterene-hydrochlorothiazide (MAXZIDE-25) 37.5-25 MG per tablet Take 1 tablet by mouth daily.       Marland Kitchen warfarin (COUMADIN) 2 MG tablet Take 1 tablet (2 mg total) by mouth as directed.  30 tablet  3   No current facility-administered medications for this visit.    Allergies  Allergen Reactions  . Ace Inhibitors Cough  . Codeine Nausea Only  . Colestipol Hcl Other (See Comments)    Malaise  . Doxycycline Other (See Comments)  . Lovastatin Other (See Comments)  . Meclizine Other (See Comments)    Tachycardia  . Pravastatin Other (See Comments)    Arthralgia, Myalgias  . Sulfa Antibiotics Other (See Comments)    unknown  . Zetia [Ezetimibe] Other (See Comments)    Arthralgia, Myalgias    History   Social History  . Marital Status: Widowed    Spouse Name: N/A    Number of Children: 7  . Years of Education: N/A   Occupational History  . Retired     [employer]   Social History Main Topics  . Smoking status: Never Smoker   . Smokeless tobacco: Never Used  . Alcohol Use: No  . Drug Use: No  .  Sexual Activity: Not on file   Other Topics Concern  . Not on file   Social History Narrative  . No narrative on file    Family History  Problem Relation Age of Onset  . Hypertension Father   . Diabetes Mother   . Peripheral vascular disease Father   . Heart disease Brother 47  . Liver cancer Sister    Physical Exam: Filed Vitals:   08/24/13 0900  BP: 138/90  Pulse: 85  Height: 5\' 6"  (1.676 m)  Weight: 190 lb (86.183 kg)    GEN- The patient is well appearing, alert and oriented x 3 today.   Head- normocephalic, atraumatic Eyes-  Sclera clear, conjunctiva pink Ears- hearing intact Oropharynx- clear Neck- supple, no JVP Lymph- no cervical lymphadenopathy Lungs- Clear to ausculation bilaterally, normal work of breathing Heart- irregular rate and rhythm GI- soft, NT, ND, +  BS Extremities- no clubbing, cyanosis, or edema  Echo 9/13 is reviewed Ekg today reveals afib, V rate 85, no ischemic changes  Dr Polly Cobia records are reviewed  Assessment and Plan: 1. Permanent afib Rate controlled Anticoagulated  2. Severe TR Remains symptom free Will repeat echo at this time  3. HL Recent records from Dr Duaine Dredge reveals elevated LDL.  She is reluctant to consider changes at this time due to statin related myalgias previously  4. HTN Stable No change required today  Return to see Tereso Newcomer in 6 months I will see in a year

## 2013-09-12 ENCOUNTER — Ambulatory Visit (HOSPITAL_COMMUNITY): Payer: Medicare Other | Attending: Family Medicine | Admitting: Radiology

## 2013-09-12 DIAGNOSIS — E785 Hyperlipidemia, unspecified: Secondary | ICD-10-CM | POA: Insufficient documentation

## 2013-09-12 DIAGNOSIS — R0602 Shortness of breath: Secondary | ICD-10-CM

## 2013-09-12 DIAGNOSIS — E039 Hypothyroidism, unspecified: Secondary | ICD-10-CM | POA: Insufficient documentation

## 2013-09-12 DIAGNOSIS — I079 Rheumatic tricuspid valve disease, unspecified: Secondary | ICD-10-CM | POA: Insufficient documentation

## 2013-09-12 DIAGNOSIS — I080 Rheumatic disorders of both mitral and aortic valves: Secondary | ICD-10-CM | POA: Insufficient documentation

## 2013-09-12 DIAGNOSIS — R0609 Other forms of dyspnea: Secondary | ICD-10-CM | POA: Insufficient documentation

## 2013-09-12 DIAGNOSIS — I10 Essential (primary) hypertension: Secondary | ICD-10-CM | POA: Insufficient documentation

## 2013-09-12 DIAGNOSIS — R0989 Other specified symptoms and signs involving the circulatory and respiratory systems: Secondary | ICD-10-CM | POA: Insufficient documentation

## 2013-09-12 DIAGNOSIS — I4891 Unspecified atrial fibrillation: Secondary | ICD-10-CM | POA: Insufficient documentation

## 2013-09-12 NOTE — Progress Notes (Signed)
Echocardiogram performed.  

## 2013-09-20 ENCOUNTER — Ambulatory Visit (INDEPENDENT_AMBULATORY_CARE_PROVIDER_SITE_OTHER): Payer: Medicare Other

## 2013-09-20 DIAGNOSIS — I4891 Unspecified atrial fibrillation: Secondary | ICD-10-CM

## 2013-09-20 DIAGNOSIS — Z7901 Long term (current) use of anticoagulants: Secondary | ICD-10-CM

## 2013-09-20 LAB — POCT INR: INR: 2.3

## 2013-11-01 ENCOUNTER — Ambulatory Visit (INDEPENDENT_AMBULATORY_CARE_PROVIDER_SITE_OTHER): Payer: Medicare Other | Admitting: *Deleted

## 2013-11-01 DIAGNOSIS — Z7901 Long term (current) use of anticoagulants: Secondary | ICD-10-CM

## 2013-11-01 DIAGNOSIS — I4891 Unspecified atrial fibrillation: Secondary | ICD-10-CM

## 2013-12-02 ENCOUNTER — Other Ambulatory Visit: Payer: Self-pay | Admitting: Family Medicine

## 2013-12-02 ENCOUNTER — Ambulatory Visit
Admission: RE | Admit: 2013-12-02 | Discharge: 2013-12-02 | Disposition: A | Discharge status: Home / Self Care | Payer: Medicare Other | Source: Ambulatory Visit | Attending: Family Medicine | Admitting: Family Medicine

## 2013-12-02 DIAGNOSIS — R05 Cough: Secondary | ICD-10-CM

## 2013-12-13 ENCOUNTER — Ambulatory Visit (INDEPENDENT_AMBULATORY_CARE_PROVIDER_SITE_OTHER): Payer: Medicare Other | Admitting: General Practice

## 2013-12-13 DIAGNOSIS — Z7901 Long term (current) use of anticoagulants: Secondary | ICD-10-CM

## 2013-12-13 DIAGNOSIS — I4891 Unspecified atrial fibrillation: Secondary | ICD-10-CM

## 2013-12-13 LAB — POCT INR: INR: 2.6

## 2013-12-19 ENCOUNTER — Telehealth: Payer: Self-pay | Admitting: *Deleted

## 2013-12-19 MED ORDER — WARFARIN SODIUM 2 MG PO TABS: 2.0000 mg | ORAL_TABLET | ORAL | Status: DC

## 2013-12-19 NOTE — Telephone Encounter (Signed)
Warfarin refill ?

## 2014-01-24 ENCOUNTER — Ambulatory Visit (INDEPENDENT_AMBULATORY_CARE_PROVIDER_SITE_OTHER): Payer: Medicare Other | Admitting: *Deleted

## 2014-01-24 ENCOUNTER — Other Ambulatory Visit: Payer: Self-pay | Admitting: *Deleted

## 2014-01-24 DIAGNOSIS — Z7901 Long term (current) use of anticoagulants: Secondary | ICD-10-CM

## 2014-01-24 DIAGNOSIS — I4891 Unspecified atrial fibrillation: Secondary | ICD-10-CM

## 2014-01-24 DIAGNOSIS — Z5181 Encounter for therapeutic drug level monitoring: Secondary | ICD-10-CM | POA: Insufficient documentation

## 2014-01-24 LAB — POCT INR: INR: 2.9

## 2014-01-24 MED ORDER — WARFARIN SODIUM 2 MG PO TABS: 2.0000 mg | ORAL_TABLET | ORAL | Status: DC

## 2014-01-24 NOTE — Addendum Note (Signed)
Addended by: Zenovia Jarred on: 01/24/2014 09:19 AM   Modules accepted: Orders

## 2014-01-24 NOTE — Addendum Note (Signed)
Addended by: Zenovia Jarred on: 01/24/2014 09:20 AM   Modules accepted: Orders

## 2014-02-22 ENCOUNTER — Encounter: Payer: Self-pay | Admitting: Internal Medicine

## 2014-02-22 ENCOUNTER — Ambulatory Visit (INDEPENDENT_AMBULATORY_CARE_PROVIDER_SITE_OTHER): Payer: Medicare Other | Admitting: Internal Medicine

## 2014-02-22 VITALS — BP 144/89 | HR 80 | Ht 66.5 in | Wt 196.0 lb

## 2014-02-22 DIAGNOSIS — I4891 Unspecified atrial fibrillation: Secondary | ICD-10-CM

## 2014-02-22 MED ORDER — DILTIAZEM HCL ER BEADS 360 MG PO CP24: 360.0000 mg | ORAL_CAPSULE | Freq: Every day | ORAL | Status: DC

## 2014-02-22 MED ORDER — METOPROLOL SUCCINATE ER 50 MG PO TB24: 50.0000 mg | ORAL_TABLET | Freq: Every day | ORAL | Status: DC

## 2014-02-22 NOTE — Patient Instructions (Signed)
Your physician wants you to follow-up in: 6 months with Dr Vallery Ridge will receive a reminder letter in the mail two months in advance. If you don't receive a letter, please call our office to schedule the follow-up appointment.  Your physician has requested that you have an echocardiogram. Echocardiography is a painless test that uses sound waves to create images of your heart. It provides your doctor with information about the size and shape of your heart and how well your heart's chambers and valves are working. This procedure takes approximately one hour. There are no restrictions for this procedure.--in 6 onths prior to follow up    Your physician has recommended you make the following change in your medication:  1) Increase Diltiazem to 360mg  daily 2) Decrease Toprol to 50mg  daily

## 2014-02-22 NOTE — Progress Notes (Signed)
PCP: Marylene Land, MD  The patient presents today for routine cardiology followup.  Since last being seen in our clinic, the patient reports doing reasonably well. Her exercise tolerance is stable.  She reports occasional L chest wall soreness which is unchanged.  She denies exertional symptoms.  He has some fatigue which she attributes to toprol.  She would like to decrease this medicine.  Today, she denies symptoms of palpitations, shortness of breath, orthopnea, PND, lower extremity edema, syncope, or neurologic sequela.  The patient feels that she is tolerating medications without difficulties and is otherwise without complaint today.   Past Medical History  Diagnosis Date  . HTN (hypertension) 1983  . Persistent atrial fibrillation 2009  . Anxiety age 50  . Hyperlipidemia 2000  . DDD (degenerative disc disease)   . DJD (degenerative joint disease)   . Vertigo   . GERD (gastroesophageal reflux disease) 2003  . Osteoporosis 2001  . Hypothyroidism 2009  . Adenomatous colon polyp 2005  . Vitamin D deficiency 2011  . Gastroenteritis 2011  . History of pneumonia   . Iron deficiency anemia 1983  . Persistent headaches 1987  . Osteoarthritis 2001  . Carotid artery bruit 2001    left  . Tricuspid regurgitation     severe by echo 02/25/12   Past Surgical History  Procedure Laterality Date  . S/p blt    . Colonoscopy w/ biopsies and polypectomy  09/16/2004    adenomatous polyp, diverticulosis, internal hemorrhoids  . Tubal ligation      Current Outpatient Prescriptions  Medication Sig Dispense Refill  . diltiazem (TIAZAC) 240 MG 24 hr capsule Take 240 mg by mouth daily.        Marland Kitchen levothyroxine (SYNTHROID, LEVOTHROID) 25 MCG tablet Take 25 mcg by mouth daily.        Marland Kitchen LORazepam (ATIVAN) 1 MG tablet Take 1 mg by mouth every 8 (eight) hours.      . metoprolol (TOPROL-XL) 50 MG 24 hr tablet Take 50 mg by mouth 2 (two) times daily.        Marland Kitchen omeprazole (PRILOSEC) 20 MG capsule Take 20  mg by mouth as needed.       . simvastatin (ZOCOR) 40 MG tablet Take 20 mg by mouth at bedtime.       . triamterene-hydrochlorothiazide (MAXZIDE-25) 37.5-25 MG per tablet Take 1 tablet by mouth daily.       Marland Kitchen warfarin (COUMADIN) 2 MG tablet Take 1 tablet (2 mg total) by mouth as directed.  30 tablet  3  . Cholecalciferol (VITAMIN D3) 2000 UNITS TABS Take 1 tablet by mouth daily.       No current facility-administered medications for this visit.    Allergies  Allergen Reactions  . Ace Inhibitors Cough  . Codeine Nausea Only  . Colestipol Hcl Other (See Comments)    Malaise  . Doxycycline Other (See Comments)  . Lovastatin Other (See Comments)  . Meclizine Other (See Comments)    Tachycardia  . Pravastatin Other (See Comments)    Arthralgia, Myalgias  . Sulfa Antibiotics Other (See Comments)    unknown  . Zetia [Ezetimibe] Other (See Comments)    Arthralgia, Myalgias    History   Social History  . Marital Status: Widowed    Spouse Name: N/A    Number of Children: 7  . Years of Education: N/A   Occupational History  . Retired     [employer]   Social History Main Topics  .  Smoking status: Never Smoker   . Smokeless tobacco: Never Used  . Alcohol Use: No  . Drug Use: No  . Sexual Activity: Not on file   Other Topics Concern  . Not on file   Social History Narrative  . No narrative on file    Family History  Problem Relation Age of Onset  . Hypertension Father   . Diabetes Mother   . Peripheral vascular disease Father   . Heart disease Brother 47  . Liver cancer Sister    Physical Exam: Filed Vitals:   02/22/14 1109  BP: 144/89  Pulse: 80  Height: 5' 6.5" (1.689 m)  Weight: 196 lb (88.905 kg)    GEN- The patient is well appearing, alert and oriented x 3 today.   Head- normocephalic, atraumatic Eyes-  Sclera clear, conjunctiva pink Ears- hearing intact Oropharynx- clear Neck- supple, no JVP Lymph- no cervical lymphadenopathy Lungs- Clear to  ausculation bilaterally, normal work of breathing Heart- irregular rate and rhythm GI- soft, NT, ND, + BS Extremities- no clubbing, cyanosis, or edema  Echo 9/14 is reviewed Ekg today reveals afib, V rate 80, no ischemic changes   Assessment and Plan: 1. Permanent afib Rate controlled Anticoagulated Decrease toprol to 50mg  daily due to fatigue Increase diltiazem to 360mg  daily Consider stopping toprol if V rates remain controlled upon return  2. Severe TR Remains symptom free Will repeat echo in 6 months 9/14 echo reviewed with her today.  3. HL Recent records from Dr Sandi Mariscal reveals elevated LDL.  She is reluctant to consider changes at this time due to statin related myalgias previously.  She declines referral to the lipid clinic presently.  4. HTN Stable, repeat by me in 142/87 No change required today  Return in 6 months

## 2014-03-07 ENCOUNTER — Ambulatory Visit (INDEPENDENT_AMBULATORY_CARE_PROVIDER_SITE_OTHER): Payer: Medicare Other | Admitting: Pharmacist

## 2014-03-07 DIAGNOSIS — I4891 Unspecified atrial fibrillation: Secondary | ICD-10-CM

## 2014-03-07 DIAGNOSIS — Z5181 Encounter for therapeutic drug level monitoring: Secondary | ICD-10-CM

## 2014-03-07 DIAGNOSIS — Z7901 Long term (current) use of anticoagulants: Secondary | ICD-10-CM

## 2014-03-07 LAB — POCT INR: INR: 3

## 2014-04-18 ENCOUNTER — Ambulatory Visit (INDEPENDENT_AMBULATORY_CARE_PROVIDER_SITE_OTHER): Payer: Medicare Other | Admitting: Pharmacist

## 2014-04-18 DIAGNOSIS — Z7901 Long term (current) use of anticoagulants: Secondary | ICD-10-CM

## 2014-04-18 DIAGNOSIS — I4891 Unspecified atrial fibrillation: Secondary | ICD-10-CM

## 2014-04-18 DIAGNOSIS — Z5181 Encounter for therapeutic drug level monitoring: Secondary | ICD-10-CM

## 2014-04-18 LAB — POCT INR: INR: 2

## 2014-05-01 ENCOUNTER — Other Ambulatory Visit: Payer: Self-pay | Admitting: Internal Medicine

## 2014-05-29 ENCOUNTER — Ambulatory Visit (INDEPENDENT_AMBULATORY_CARE_PROVIDER_SITE_OTHER): Payer: Medicare Other | Admitting: *Deleted

## 2014-05-29 DIAGNOSIS — Z5181 Encounter for therapeutic drug level monitoring: Secondary | ICD-10-CM

## 2014-05-29 DIAGNOSIS — I4891 Unspecified atrial fibrillation: Secondary | ICD-10-CM

## 2014-05-29 DIAGNOSIS — Z7901 Long term (current) use of anticoagulants: Secondary | ICD-10-CM

## 2014-05-29 LAB — POCT INR: INR: 2.1

## 2014-07-11 ENCOUNTER — Ambulatory Visit (INDEPENDENT_AMBULATORY_CARE_PROVIDER_SITE_OTHER): Payer: Medicare Other | Admitting: Pharmacist

## 2014-07-11 DIAGNOSIS — Z7901 Long term (current) use of anticoagulants: Secondary | ICD-10-CM

## 2014-07-11 DIAGNOSIS — Z5181 Encounter for therapeutic drug level monitoring: Secondary | ICD-10-CM

## 2014-07-11 DIAGNOSIS — I4891 Unspecified atrial fibrillation: Secondary | ICD-10-CM

## 2014-07-11 LAB — POCT INR: INR: 2.1

## 2014-08-22 ENCOUNTER — Ambulatory Visit (INDEPENDENT_AMBULATORY_CARE_PROVIDER_SITE_OTHER): Payer: Medicare Other | Admitting: *Deleted

## 2014-08-22 DIAGNOSIS — I4891 Unspecified atrial fibrillation: Secondary | ICD-10-CM

## 2014-08-22 DIAGNOSIS — Z5181 Encounter for therapeutic drug level monitoring: Secondary | ICD-10-CM

## 2014-08-22 DIAGNOSIS — Z7901 Long term (current) use of anticoagulants: Secondary | ICD-10-CM

## 2014-08-22 LAB — POCT INR: INR: 2.4

## 2014-08-22 MED ORDER — WARFARIN SODIUM 2 MG PO TABS: ORAL_TABLET | ORAL | Status: DC

## 2014-09-18 ENCOUNTER — Ambulatory Visit (INDEPENDENT_AMBULATORY_CARE_PROVIDER_SITE_OTHER): Payer: Medicare Other | Admitting: Internal Medicine

## 2014-09-18 ENCOUNTER — Encounter: Payer: Self-pay | Admitting: Internal Medicine

## 2014-09-18 VITALS — BP 110/76 | HR 66 | Ht 66.5 in | Wt 190.1 lb

## 2014-09-18 DIAGNOSIS — I079 Rheumatic tricuspid valve disease, unspecified: Secondary | ICD-10-CM

## 2014-09-18 DIAGNOSIS — I4891 Unspecified atrial fibrillation: Secondary | ICD-10-CM

## 2014-09-18 DIAGNOSIS — I071 Rheumatic tricuspid insufficiency: Secondary | ICD-10-CM

## 2014-09-18 DIAGNOSIS — I10 Essential (primary) hypertension: Secondary | ICD-10-CM

## 2014-09-18 MED ORDER — METOPROLOL SUCCINATE ER 50 MG PO TB24: 50.0000 mg | ORAL_TABLET | Freq: Every day | ORAL | Status: DC

## 2014-09-18 NOTE — Patient Instructions (Signed)
Your physician wants you to follow-up in: 6 months with Roderic Palau, NP  You will receive a reminder letter in the mail two months in advance. If you don't receive a letter, please call our office to schedule the follow-up appointment.   Your physician has recommended you make the following change in your medication:  1) Decrease Metoprolol to 50mg  daily

## 2014-09-18 NOTE — Progress Notes (Signed)
PCP: Marylene Land, MD  The patient presents today for routine cardiology followup.  Since last being seen in our clinic, the patient reports doing reasonably well. Her exercise tolerance is stable.  She reports occasional L chest wall soreness which is unchanged.  She denies exertional symptoms.  He has some fatigue which she attributes to toprol.  On last visit she was to decrease the Toprol and increase Cardizem but the cost of the higher dose made her  make the decision to keep the meds the same. EKG does show some long rr intervals, but with HR at 66 bpm.   Past Medical History  Diagnosis Date  . HTN (hypertension) 1983  . Persistent atrial fibrillation 2009  . Anxiety age 80  . Hyperlipidemia 2000  . DDD (degenerative disc disease)   . DJD (degenerative joint disease)   . Vertigo   . GERD (gastroesophageal reflux disease) 2003  . Osteoporosis 2001  . Hypothyroidism 2009  . Adenomatous colon polyp 2005  . Vitamin D deficiency 2011  . Gastroenteritis 2011  . History of pneumonia   . Iron deficiency anemia 1983  . Persistent headaches 1987  . Osteoarthritis 2001  . Carotid artery bruit 2001    left  . Tricuspid regurgitation     severe by echo 02/25/12   Past Surgical History  Procedure Laterality Date  . S/p blt    . Colonoscopy w/ biopsies and polypectomy  09/16/2004    adenomatous polyp, diverticulosis, internal hemorrhoids  . Tubal ligation      Current Outpatient Prescriptions  Medication Sig Dispense Refill  . Bepotastine Besilate (BEPREVE) 1.5 % SOLN Place 1 drop into both eyes as needed (Eye watering).      . Cholecalciferol (VITAMIN D3) 2000 UNITS TABS Take 1 tablet by mouth daily.      Marland Kitchen diltiazem (DILACOR XR) 240 MG 24 hr capsule Take 240 mg by mouth daily.      Marland Kitchen levothyroxine (SYNTHROID, LEVOTHROID) 25 MCG tablet Take 25 mcg by mouth daily.        Marland Kitchen LORazepam (ATIVAN) 1 MG tablet Take 1 mg by mouth 2 (two) times daily as needed (nervousness).       .  metoprolol succinate (TOPROL-XL) 50 MG 24 hr tablet Take 1 tablet (50 mg total) by mouth daily.  90 tablet  3  . omeprazole (PRILOSEC) 20 MG capsule Take 20 mg by mouth daily.       . simvastatin (ZOCOR) 40 MG tablet Take 20 mg by mouth at bedtime.       . triamterene-hydrochlorothiazide (MAXZIDE-25) 37.5-25 MG per tablet Take 1 tablet by mouth daily.       Marland Kitchen warfarin (COUMADIN) 2 MG tablet Take as directed by coumadin clinic  30 tablet  3   No current facility-administered medications for this visit.    Allergies  Allergen Reactions  . Meclizine Other (See Comments)    Tachycardia  . Pravastatin Other (See Comments)    Arthralgia, Myalgias  . Zetia [Ezetimibe] Other (See Comments)    Arthralgia, Myalgias  . Doxycycline Other (See Comments)    UNKNOWN  . Lovastatin Other (See Comments)    UNKNOWN  . Sulfa Antibiotics Other (See Comments)    unknown  . Ace Inhibitors Cough  . Codeine Nausea Only  . Colestipol Hcl Other (See Comments)    Malaise    History   Social History  . Marital Status: Widowed    Spouse Name: N/A    Number of  Children: 7  . Years of Education: N/A   Occupational History  . Retired     [employer]   Social History Main Topics  . Smoking status: Never Smoker   . Smokeless tobacco: Never Used  . Alcohol Use: No  . Drug Use: No  . Sexual Activity: Not on file   Other Topics Concern  . Not on file   Social History Narrative  . No narrative on file    Family History  Problem Relation Age of Onset  . Hypertension Father   . Diabetes Mother   . Peripheral vascular disease Father   . Heart disease Brother 14  . Liver cancer Sister    Physical Exam: Filed Vitals:   09/18/14 1034  BP: 110/76  Pulse: 66  Height: 5' 6.5" (1.689 m)  Weight: 86.238 kg (190 lb 1.9 oz)    GEN- The patient is well appearing, alert and oriented x 3 today.   Head- normocephalic, atraumatic Eyes-  Sclera clear, conjunctiva pink Ears- hearing  intact Oropharynx- clear Neck- supple, no JVP Lymph- no cervical lymphadenopathy Lungs- Clear to ausculation bilaterally, normal work of breathing Heart- irregular rate and rhythm GI- soft, NT, ND, + BS Extremities- no clubbing, cyanosis, or edema  Echo 9/14 is reviewed Ekg today reveals afib, V rate  66 bpm , QTc 443ms.   Assessment and Plan: 1. Permanent afib Rate controlled Anticoagulated Decrease toprol to 50mg  daily due to fatigue and continue with same Cardizem dose.   Consider stopping toprol if V rates remain controlled upon return   2. Severe TR Remains symptom free Consideration to repeat echo next visit.  3. HL Recent records from Dr Sandi Mariscal reveals elevated LDL.  She is reluctant to consider changes at this time due to statin related myalgias previously.  She declines referral to the lipid clinic presently.  4. HTN Stable . No change required today  Return in 6 months

## 2014-09-29 ENCOUNTER — Other Ambulatory Visit: Payer: Self-pay | Admitting: Family Medicine

## 2014-09-29 ENCOUNTER — Ambulatory Visit
Admission: RE | Admit: 2014-09-29 | Discharge: 2014-09-29 | Disposition: A | Discharge status: Home / Self Care | Payer: Medicare Other | Source: Ambulatory Visit | Attending: Family Medicine | Admitting: Family Medicine

## 2014-09-29 DIAGNOSIS — R079 Chest pain, unspecified: Secondary | ICD-10-CM

## 2014-10-03 ENCOUNTER — Ambulatory Visit (INDEPENDENT_AMBULATORY_CARE_PROVIDER_SITE_OTHER): Payer: Medicare Other | Admitting: Pharmacist

## 2014-10-03 DIAGNOSIS — Z7901 Long term (current) use of anticoagulants: Secondary | ICD-10-CM

## 2014-10-03 DIAGNOSIS — Z5181 Encounter for therapeutic drug level monitoring: Secondary | ICD-10-CM

## 2014-10-03 DIAGNOSIS — I4891 Unspecified atrial fibrillation: Secondary | ICD-10-CM

## 2014-10-03 LAB — POCT INR: INR: 2.6

## 2014-11-14 ENCOUNTER — Ambulatory Visit (INDEPENDENT_AMBULATORY_CARE_PROVIDER_SITE_OTHER): Payer: Medicare Other

## 2014-11-14 DIAGNOSIS — Z7901 Long term (current) use of anticoagulants: Secondary | ICD-10-CM

## 2014-11-14 DIAGNOSIS — I4891 Unspecified atrial fibrillation: Secondary | ICD-10-CM

## 2014-11-14 DIAGNOSIS — Z5181 Encounter for therapeutic drug level monitoring: Secondary | ICD-10-CM

## 2014-11-14 LAB — POCT INR: INR: 2

## 2014-12-26 ENCOUNTER — Ambulatory Visit (INDEPENDENT_AMBULATORY_CARE_PROVIDER_SITE_OTHER): Payer: Medicare Other | Admitting: *Deleted

## 2014-12-26 DIAGNOSIS — Z5181 Encounter for therapeutic drug level monitoring: Secondary | ICD-10-CM

## 2014-12-26 DIAGNOSIS — Z7901 Long term (current) use of anticoagulants: Secondary | ICD-10-CM

## 2014-12-26 DIAGNOSIS — I4891 Unspecified atrial fibrillation: Secondary | ICD-10-CM

## 2014-12-26 LAB — POCT INR: INR: 2.4

## 2015-01-08 ENCOUNTER — Ambulatory Visit
Admission: RE | Admit: 2015-01-08 | Discharge: 2015-01-08 | Disposition: A | Discharge status: Home / Self Care | Payer: Medicare Other | Source: Ambulatory Visit | Attending: Family Medicine | Admitting: Family Medicine

## 2015-01-08 ENCOUNTER — Other Ambulatory Visit: Payer: Self-pay | Admitting: Surgery

## 2015-01-08 ENCOUNTER — Other Ambulatory Visit: Payer: Self-pay | Admitting: Family Medicine

## 2015-01-08 DIAGNOSIS — R0989 Other specified symptoms and signs involving the circulatory and respiratory systems: Secondary | ICD-10-CM

## 2015-01-08 DIAGNOSIS — R05 Cough: Secondary | ICD-10-CM

## 2015-01-08 DIAGNOSIS — R059 Cough, unspecified: Secondary | ICD-10-CM

## 2015-01-08 MED ORDER — WARFARIN SODIUM 2 MG PO TABS: ORAL_TABLET | ORAL | Status: DC

## 2015-02-05 ENCOUNTER — Telehealth: Payer: Self-pay | Admitting: Internal Medicine

## 2015-02-05 NOTE — Telephone Encounter (Signed)
Reviewed with Terri Skains recommended pt schedule appt with Ceasar Lund 02/06/15. Pt advised, agreed with plan, appt scheduled.

## 2015-02-05 NOTE — Telephone Encounter (Signed)
Pt states she has "spells" in her head, her head just does something,she cannot describe better than that-these are intermittent but seem to be more frequent recently.  Pt does have a history of vertigo, but pt states these spells feel different than her usual vertigo.  Pt does not know her heart rate or BP when this happens. Pt called Dr Sandi Mariscal today and was instructed to call Dr Rayann Heman for follow up.   Pt denies any other acute symptoms and states she is asymptomatic at this time.

## 2015-02-05 NOTE — Telephone Encounter (Signed)
Pt c/o of Chest Pain: STAT if CP now or developed within 24 hours  1. Are you having CP right now? No  2. Are you experiencing any other symptoms (ex. SOB, nausea, vomiting, sweating)? Light headed and nausea  3. How long have you been experiencing CP? 2-3 weeks ago  4. Is your CP continuous or coming and going? Coming and going  5. Have you taken Nitroglycerin? No   Pt calling stating that she has been having in her side and chest that she explains as "spells" that are getting worse. Please call back and advise.

## 2015-02-06 ENCOUNTER — Encounter: Payer: Self-pay | Admitting: Nurse Practitioner

## 2015-02-06 ENCOUNTER — Ambulatory Visit (INDEPENDENT_AMBULATORY_CARE_PROVIDER_SITE_OTHER): Payer: Medicare Other | Admitting: Nurse Practitioner

## 2015-02-06 ENCOUNTER — Ambulatory Visit (INDEPENDENT_AMBULATORY_CARE_PROVIDER_SITE_OTHER): Payer: Medicare Other

## 2015-02-06 ENCOUNTER — Ambulatory Visit: Payer: Medicare Other | Admitting: Nurse Practitioner

## 2015-02-06 VITALS — BP 115/82 | HR 109 | Ht 66.5 in | Wt 193.0 lb

## 2015-02-06 DIAGNOSIS — Z5181 Encounter for therapeutic drug level monitoring: Secondary | ICD-10-CM

## 2015-02-06 DIAGNOSIS — Z7901 Long term (current) use of anticoagulants: Secondary | ICD-10-CM

## 2015-02-06 DIAGNOSIS — I4891 Unspecified atrial fibrillation: Secondary | ICD-10-CM

## 2015-02-06 DIAGNOSIS — I482 Chronic atrial fibrillation, unspecified: Secondary | ICD-10-CM

## 2015-02-06 DIAGNOSIS — I071 Rheumatic tricuspid insufficiency: Secondary | ICD-10-CM

## 2015-02-06 DIAGNOSIS — R42 Dizziness and giddiness: Secondary | ICD-10-CM

## 2015-02-06 LAB — POCT INR: INR: 2.6

## 2015-02-06 NOTE — Progress Notes (Signed)
PCP: Marylene Land, MD  81 year old female with h/o permanent afib that is here this am to be evaluated for "spells in her head." She has a history of vertigo and usually has some spell of dizziness about once a week, but has has 3 spells in the last week so called her PCP who is on medical leave and was advised to come here. She has difficulty describing symptoms, but notices some degree of room spinning that lasts less than a minute. Has occurred in bed turning over, sitting and standing. She is in permanent afib and has not noticed any heart issues with the episodes. HR elevated this am but did not take metoprolol due to rush to get out of the house for this visit. Denies any worsening dyspnea above her baseline nor increase in pedal edema. Takes lasix every other day. Reports bronchitis several weeks ago and c/o mild tenderness in her sinuses and some nasal drainage.No exertional chest discomfort, describes some discomfort that sounds musculoskeletal.    Past Medical History  Diagnosis Date  . HTN (hypertension) 1983  . Persistent atrial fibrillation 2009  . Anxiety age 33  . Hyperlipidemia 2000  . DDD (degenerative disc disease)   . DJD (degenerative joint disease)   . Vertigo   . GERD (gastroesophageal reflux disease) 2003  . Osteoporosis 2001  . Hypothyroidism 2009  . Adenomatous colon polyp 2005  . Vitamin D deficiency 2011  . Gastroenteritis 2011  . History of pneumonia   . Iron deficiency anemia 1983  . Persistent headaches 1987  . Osteoarthritis 2001  . Carotid artery bruit 2001    left  . Tricuspid regurgitation     severe by echo 02/25/12   Past Surgical History  Procedure Laterality Date  . S/p blt    . Colonoscopy w/ biopsies and polypectomy  09/16/2004    adenomatous polyp, diverticulosis, internal hemorrhoids  . Tubal ligation      Current Outpatient Prescriptions  Medication Sig Dispense Refill  . diltiazem (DILACOR XR) 240 MG 24 hr capsule Take 240  mg by mouth daily.    Marland Kitchen levothyroxine (SYNTHROID, LEVOTHROID) 25 MCG tablet Take 25 mcg by mouth daily.      Marland Kitchen LORazepam (ATIVAN) 1 MG tablet Take 1 mg by mouth 2 (two) times daily as needed (nervousness).     . metoprolol succinate (TOPROL-XL) 50 MG 24 hr tablet Take 1 tablet (50 mg total) by mouth daily. 90 tablet 3  . omeprazole (PRILOSEC) 20 MG capsule Take 20 mg by mouth daily.     . simvastatin (ZOCOR) 40 MG tablet Take 20 mg by mouth at bedtime.     . triamterene-hydrochlorothiazide (MAXZIDE-25) 37.5-25 MG per tablet Take 1 tablet by mouth daily.     Marland Kitchen warfarin (COUMADIN) 2 MG tablet Take as directed by coumadin clinic 30 tablet 3   No current facility-administered medications for this visit.    Allergies  Allergen Reactions  . Meclizine Other (See Comments)    Tachycardia  . Pravastatin Other (See Comments)    Arthralgia, Myalgias  . Zetia [Ezetimibe] Other (See Comments)    Arthralgia, Myalgias  . Ace Inhibitors Cough  . Codeine Nausea Only  . Colestipol Hcl Other (See Comments)    Malaise  . Doxycycline Other (See Comments)    UNKNOWN  . Lovastatin Other (See Comments)    UNKNOWN  . Sulfa Antibiotics Other (See Comments)    unknown    History   Social History  .  Marital Status: Widowed    Spouse Name: N/A    Number of Children: 7  . Years of Education: N/A   Occupational History  . Retired     [employer]   Social History Main Topics  . Smoking status: Never Smoker   . Smokeless tobacco: Never Used  . Alcohol Use: No  . Drug Use: No  . Sexual Activity: Not on file   Other Topics Concern  . Not on file   Social History Narrative    Family History  Problem Relation Age of Onset  . Hypertension Father   . Diabetes Mother   . Peripheral vascular disease Father   . Heart disease Brother 39  . Liver cancer Sister    Physical Exam: Filed Vitals:   02/06/15 0838  BP: 115/82  Pulse: 109  Height: 5' 6.5" (1.689 m)  Weight: 193 lb (87.544 kg)    SpO2: 99%  BP standing was 120/74.  GEN- The patient is well appearing, alert and oriented x 3 today.   Head- normocephalic, atraumatic Eyes-  Sclera clear, conjunctiva pink Ears- hearing intact Oropharynx- clear Neck- supple, no JVP Lymph- no cervical lymphadenopathy Lungs- Clear to ausculation bilaterally, normal work of breathing Heart- irregular rate and rhythm GI- soft, NT, ND, + BS Extremities- no clubbing, cyanosis, or edema  Echo 9/14 is reviewed Previous EKG's reviewed. Labs reviewed  Assessment and Plan: 1. Vertigo/dizziness Has had similar symptoms in past with dx of vertigo Carotid u/s 48 hour monitor LABS- CBC.MAG, BMET, TSH, INR also due today.   2 Permanent afib Rate usually controlled, elevated this am, but forgot to take metoprolol Anticoagulated   3. Severe TR Remains symptom free as far as worsening dyspnea, pedal edema, ? If current symptoms may be related. Repeat echo  4. HL Recent records from Dr Sandi Mariscal reveals elevated LDL.  She is reluctant to consider changes at this time due to statin related myalgias previously.  She declines referral to the lipid clinic presently.  5. HTN Stable . No change required today  F/U in one month, sooner if any of above abnormal. If symptoms persist despite normal tests, consider referral to ENT.    5

## 2015-02-06 NOTE — Patient Instructions (Signed)
Your physician recommends that you schedule a follow-up appointment in: 4 weeks with Ceasar Lund  Your physician recommends that you return for lab work today: BMP/MG/CBC/TSH  Your physician has requested that you have a carotid duplex. This test is an ultrasound of the carotid arteries in your neck. It looks at blood flow through these arteries that supply the brain with blood. Allow one hour for this exam. There are no restrictions or special instructions.  Your physician has recommended that you wear a holter monitor. Holter monitors are medical devices that record the heart's electrical activity. Doctors most often use these monitors to diagnose arrhythmias. Arrhythmias are problems with the speed or rhythm of the heartbeat. The monitor is a small, portable device. You can wear one while you do your normal daily activities. This is usually used to diagnose what is causing palpitations/syncope (passing out).

## 2015-02-07 ENCOUNTER — Encounter: Payer: Self-pay | Admitting: Radiology

## 2015-02-07 ENCOUNTER — Encounter (INDEPENDENT_AMBULATORY_CARE_PROVIDER_SITE_OTHER): Payer: Medicare Other

## 2015-02-07 DIAGNOSIS — R42 Dizziness and giddiness: Secondary | ICD-10-CM | POA: Diagnosis not present

## 2015-02-07 DIAGNOSIS — I482 Chronic atrial fibrillation, unspecified: Secondary | ICD-10-CM

## 2015-02-07 NOTE — Progress Notes (Signed)
Patient ID: Lori Bridges, female   DOB: 1930-02-24, 81 y.o.   MRN: [id number] E cardio 48hr holter applied

## 2015-02-09 ENCOUNTER — Ambulatory Visit (HOSPITAL_COMMUNITY): Payer: Medicare Other | Attending: Nurse Practitioner | Admitting: Cardiology

## 2015-02-09 ENCOUNTER — Other Ambulatory Visit (HOSPITAL_COMMUNITY): Payer: Self-pay | Admitting: Cardiology

## 2015-02-09 DIAGNOSIS — R42 Dizziness and giddiness: Secondary | ICD-10-CM

## 2015-02-09 DIAGNOSIS — I6522 Occlusion and stenosis of left carotid artery: Secondary | ICD-10-CM | POA: Diagnosis not present

## 2015-02-09 DIAGNOSIS — I6523 Occlusion and stenosis of bilateral carotid arteries: Secondary | ICD-10-CM

## 2015-02-09 NOTE — Progress Notes (Signed)
Carotid duplex performed 

## 2015-02-12 ENCOUNTER — Ambulatory Visit (HOSPITAL_COMMUNITY): Payer: Medicare Other | Attending: Internal Medicine | Admitting: Cardiology

## 2015-02-12 DIAGNOSIS — I482 Chronic atrial fibrillation, unspecified: Secondary | ICD-10-CM

## 2015-02-12 DIAGNOSIS — I071 Rheumatic tricuspid insufficiency: Secondary | ICD-10-CM | POA: Diagnosis not present

## 2015-02-12 DIAGNOSIS — I480 Paroxysmal atrial fibrillation: Secondary | ICD-10-CM

## 2015-02-12 NOTE — Progress Notes (Signed)
Echo performed. 

## 2015-02-16 ENCOUNTER — Telehealth: Payer: Self-pay | Admitting: Internal Medicine

## 2015-02-16 ENCOUNTER — Other Ambulatory Visit (INDEPENDENT_AMBULATORY_CARE_PROVIDER_SITE_OTHER): Payer: Medicare Other | Admitting: *Deleted

## 2015-02-16 DIAGNOSIS — I482 Chronic atrial fibrillation: Secondary | ICD-10-CM

## 2015-02-16 LAB — CBC WITH DIFFERENTIAL/PLATELET
Basophils Absolute: 0 10*3/uL (ref 0.0–0.1)
Basophils Relative: 0.2 % (ref 0.0–3.0)
EOS PCT: 1.4 % (ref 0.0–5.0)
Eosinophils Absolute: 0.1 10*3/uL (ref 0.0–0.7)
HEMATOCRIT: 47.8 % — AB (ref 36.0–46.0)
Hemoglobin: 16 g/dL — ABNORMAL HIGH (ref 12.0–15.0)
LYMPHS PCT: 44.2 % (ref 12.0–46.0)
Lymphs Abs: 2 10*3/uL (ref 0.7–4.0)
MCHC: 33.5 g/dL (ref 30.0–36.0)
MCV: 88 fl (ref 78.0–100.0)
MONO ABS: 0.4 10*3/uL (ref 0.1–1.0)
Monocytes Relative: 8.5 % (ref 3.0–12.0)
NEUTROS PCT: 45.7 % (ref 43.0–77.0)
Neutro Abs: 2.1 10*3/uL (ref 1.4–7.7)
PLATELETS: 192 10*3/uL (ref 150.0–400.0)
RBC: 5.43 Mil/uL — AB (ref 3.87–5.11)
RDW: 14.4 % (ref 11.5–15.5)
WBC: 4.6 10*3/uL (ref 4.0–10.5)

## 2015-02-16 LAB — BASIC METABOLIC PANEL
BUN: 24 mg/dL — ABNORMAL HIGH (ref 6–23)
CHLORIDE: 104 meq/L (ref 96–112)
CO2: 29 mEq/L (ref 19–32)
CREATININE: 1.4 mg/dL — AB (ref 0.40–1.20)
Calcium: 10.4 mg/dL (ref 8.4–10.5)
GFR: 46.23 mL/min — ABNORMAL LOW (ref >60.00)
Glucose, Bld: 106 mg/dL — ABNORMAL HIGH (ref 70–99)
Potassium: 3.9 mEq/L (ref 3.5–5.1)
Sodium: 139 mEq/L (ref 135–145)

## 2015-02-16 LAB — TSH: TSH: 3.65 u[IU]/mL (ref 0.35–4.50)

## 2015-02-16 LAB — MAGNESIUM: MAGNESIUM: 2 mg/dL (ref 1.5–2.5)

## 2015-02-16 NOTE — Telephone Encounter (Signed)
Want to know the results of test done last week.

## 2015-02-20 NOTE — Telephone Encounter (Signed)
Follow up     Calling for lab results

## 2015-02-21 NOTE — Telephone Encounter (Signed)
Patient notified of holter monitor results. Also reviewed lab results with patient again

## 2015-03-07 ENCOUNTER — Encounter: Payer: Self-pay | Admitting: Nurse Practitioner

## 2015-03-07 ENCOUNTER — Ambulatory Visit (INDEPENDENT_AMBULATORY_CARE_PROVIDER_SITE_OTHER): Payer: Medicare Other | Admitting: Nurse Practitioner

## 2015-03-07 VITALS — BP 130/82 | HR 76 | Ht 66.0 in | Wt 190.8 lb

## 2015-03-07 DIAGNOSIS — I482 Chronic atrial fibrillation, unspecified: Secondary | ICD-10-CM

## 2015-03-07 DIAGNOSIS — I6522 Occlusion and stenosis of left carotid artery: Secondary | ICD-10-CM

## 2015-03-07 NOTE — Patient Instructions (Signed)
Your physician wants you to follow-up in: 6 month with Dr. Rayann Heman. You will receive a reminder letter in the mail two months in advance. If you don't receive a letter, please call our office to schedule the follow-up appointment.

## 2015-03-07 NOTE — Progress Notes (Signed)
PCP: Marylene Land, MD  Electrophysiologist: Dr. Rayann Heman  81 year old female with h/o permanent afib that is here this am  for f/u "spells in her head." She has a history of vertigo and usually has some spell of dizziness about once a week, but has has 3 spells in a short period of time so she came for evaluation. She had labs, echo, Holter all of which were stable. She states she is back now to her usual pattern of vertigo. . She has difficulty describing symptoms, but notices some degree of room spinning that lasts less than a minute. Has occurred in bed turning over, sitting and standing. She is in permanent afib and has not noticed any heart issues with the episodes. V. Rates controlled today. Denies any worsening dyspnea above her baseline nor increase in pedal edema.She does have significant TR, has deferred repair in the past. Takes lasix every other day.  Today, she has no new complaints and otherwise feels well.    Past Medical History  Diagnosis Date  . HTN (hypertension) 1983  . Persistent atrial fibrillation 2009  . Anxiety age 89  . Hyperlipidemia 2000  . DDD (degenerative disc disease)   . DJD (degenerative joint disease)   . Vertigo   . GERD (gastroesophageal reflux disease) 2003  . Osteoporosis 2001  . Hypothyroidism 2009  . Adenomatous colon polyp 2005  . Vitamin D deficiency 2011  . Gastroenteritis 2011  . History of pneumonia   . Iron deficiency anemia 1983  . Persistent headaches 1987  . Osteoarthritis 2001  . Carotid artery bruit 2001    left  . Tricuspid regurgitation     severe by echo 02/25/12   Past Surgical History  Procedure Laterality Date  . S/p blt    . Colonoscopy w/ biopsies and polypectomy  09/16/2004    adenomatous polyp, diverticulosis, internal hemorrhoids  . Tubal ligation      Current Outpatient Prescriptions  Medication Sig Dispense Refill  . diltiazem (DILACOR XR) 240 MG 24 hr capsule Take 240 mg by mouth daily.    Marland Kitchen  levothyroxine (SYNTHROID, LEVOTHROID) 25 MCG tablet Take 25 mcg by mouth daily.      Marland Kitchen LORazepam (ATIVAN) 1 MG tablet Take 1 mg by mouth 2 (two) times daily as needed (nervousness).     . metoprolol succinate (TOPROL-XL) 50 MG 24 hr tablet Take 1 tablet (50 mg total) by mouth daily. 90 tablet 3  . omeprazole (PRILOSEC) 20 MG capsule Take 20 mg by mouth daily as needed.     . simvastatin (ZOCOR) 40 MG tablet Take 20 mg by mouth at bedtime.     . triamterene-hydrochlorothiazide (MAXZIDE-25) 37.5-25 MG per tablet Take 0.5 tablets by mouth daily.     Marland Kitchen warfarin (COUMADIN) 2 MG tablet Take as directed by coumadin clinic 30 tablet 3   No current facility-administered medications for this visit.    Allergies  Allergen Reactions  . Meclizine Other (See Comments)    Tachycardia  . Pravastatin Other (See Comments)    Arthralgia, Myalgias  . Zetia [Ezetimibe] Other (See Comments)    Arthralgia, Myalgias  . Ace Inhibitors Cough  . Codeine Nausea Only  . Colestipol Hcl Other (See Comments)    Malaise  . Doxycycline Other (See Comments)    UNKNOWN  . Lovastatin Other (See Comments)    UNKNOWN  . Sulfa Antibiotics Other (See Comments)    unknown    History   Social History  .  Marital Status: Widowed    Spouse Name: N/A  . Number of Children: 7  . Years of Education: N/A   Occupational History  . Retired     [employer]   Social History Main Topics  . Smoking status: Never Smoker   . Smokeless tobacco: Never Used  . Alcohol Use: No  . Drug Use: No  . Sexual Activity: Not on file   Other Topics Concern  . Not on file   Social History Narrative    Family History  Problem Relation Age of Onset  . Hypertension Father   . Diabetes Mother   . Peripheral vascular disease Father   . Heart disease Brother 20  . Liver cancer Sister    Physical Exam: Filed Vitals:   03/07/15 1008  BP: 130/82  Pulse: 76  Height: 5\' 6"  (1.676 m)  Weight: 190 lb 12.8 oz (86.546 kg)  BP  standing was 120/74.  GEN- The patient is well appearing, alert and oriented x 3 today.   Head- normocephalic, atraumatic Eyes-  Sclera clear, conjunctiva pink Ears- hearing intact Oropharynx- clear Neck- supple, no JVP Lymph- no cervical lymphadenopathy Lungs- Clear to ausculation bilaterally, normal work of breathing Heart- irregular rate and rhythm GI- soft, NT, ND, + BS Extremities- no clubbing, cyanosis, or edema  Echo 2/15-- Left ventricle: The cavity size was normal. Systolic function was normal. The estimated ejection fraction was in the range of 55% to 60%. Wall motion was normal; there were no regional wall motion abnormalities. - Ventricular septum: The contour showed diastolic flattening. These changes are consistent with RV volume overload. - Right ventricle: The cavity size was dilated. Wall thickness was normal. - Right atrium: The atrium was moderately dilated. - Tricuspid valve: There was moderate-severe regurgitation directed centrally. - Pulmonary arteries: PA peak pressure: 34 mm Hg (S).  Carotids- 1-39% bilateral ICA stenosis. Patent vertebral arteries with antegrade flow. Normal subclavian arteries, bilaterally. Monitor- no significant arrhythmia    Assessment and Plan:  1. Vertigo/dizziness Chronic, has returned to her usual pattern Recent work-up did not show any significant findings   2 Permanent afib Rate controlled no change Continue warfarin   3. Severe TR Remains symptom free as far as worsening dyspnea, pedal edema,  Has deferred repair in past  4. Non obstructive carotid disease  Repeat carotids in 2 years  5. HTN Stable .  F/u Dr. Rayann Heman in 6 months.    F/u with Dr. Rayann Heman in 6 months

## 2015-03-20 ENCOUNTER — Ambulatory Visit (INDEPENDENT_AMBULATORY_CARE_PROVIDER_SITE_OTHER): Payer: Medicare Other | Admitting: *Deleted

## 2015-03-20 DIAGNOSIS — Z7901 Long term (current) use of anticoagulants: Secondary | ICD-10-CM

## 2015-03-20 DIAGNOSIS — I4891 Unspecified atrial fibrillation: Secondary | ICD-10-CM

## 2015-03-20 DIAGNOSIS — Z5181 Encounter for therapeutic drug level monitoring: Secondary | ICD-10-CM | POA: Diagnosis not present

## 2015-03-20 LAB — POCT INR: INR: 2.3

## 2015-05-01 ENCOUNTER — Ambulatory Visit (INDEPENDENT_AMBULATORY_CARE_PROVIDER_SITE_OTHER): Payer: Medicare Other | Admitting: *Deleted

## 2015-05-01 DIAGNOSIS — I4891 Unspecified atrial fibrillation: Secondary | ICD-10-CM

## 2015-05-01 DIAGNOSIS — Z7901 Long term (current) use of anticoagulants: Secondary | ICD-10-CM

## 2015-05-01 DIAGNOSIS — Z5181 Encounter for therapeutic drug level monitoring: Secondary | ICD-10-CM

## 2015-05-01 LAB — POCT INR: INR: 2

## 2015-05-19 ENCOUNTER — Other Ambulatory Visit: Payer: Self-pay | Admitting: Internal Medicine

## 2015-06-12 ENCOUNTER — Ambulatory Visit (INDEPENDENT_AMBULATORY_CARE_PROVIDER_SITE_OTHER): Payer: Medicare Other | Admitting: *Deleted

## 2015-06-12 DIAGNOSIS — I4891 Unspecified atrial fibrillation: Secondary | ICD-10-CM | POA: Diagnosis not present

## 2015-06-12 DIAGNOSIS — Z7901 Long term (current) use of anticoagulants: Secondary | ICD-10-CM

## 2015-06-12 DIAGNOSIS — Z5181 Encounter for therapeutic drug level monitoring: Secondary | ICD-10-CM | POA: Diagnosis not present

## 2015-06-12 LAB — POCT INR: INR: 2.7

## 2015-06-13 ENCOUNTER — Other Ambulatory Visit: Payer: Self-pay | Admitting: Family Medicine

## 2015-06-13 DIAGNOSIS — R270 Ataxia, unspecified: Secondary | ICD-10-CM

## 2015-06-13 DIAGNOSIS — R51 Headache: Principal | ICD-10-CM

## 2015-06-13 DIAGNOSIS — R519 Headache, unspecified: Secondary | ICD-10-CM

## 2015-06-13 DIAGNOSIS — R42 Dizziness and giddiness: Secondary | ICD-10-CM

## 2015-06-25 ENCOUNTER — Other Ambulatory Visit: Payer: Medicare Other

## 2015-07-24 ENCOUNTER — Ambulatory Visit (INDEPENDENT_AMBULATORY_CARE_PROVIDER_SITE_OTHER): Payer: Medicare Other | Admitting: *Deleted

## 2015-07-24 DIAGNOSIS — Z7901 Long term (current) use of anticoagulants: Secondary | ICD-10-CM

## 2015-07-24 DIAGNOSIS — Z5181 Encounter for therapeutic drug level monitoring: Secondary | ICD-10-CM | POA: Diagnosis not present

## 2015-07-24 DIAGNOSIS — I4891 Unspecified atrial fibrillation: Secondary | ICD-10-CM | POA: Diagnosis not present

## 2015-07-24 LAB — POCT INR: INR: 2

## 2015-09-04 ENCOUNTER — Ambulatory Visit (INDEPENDENT_AMBULATORY_CARE_PROVIDER_SITE_OTHER): Payer: Medicare Other | Admitting: *Deleted

## 2015-09-04 DIAGNOSIS — Z7901 Long term (current) use of anticoagulants: Secondary | ICD-10-CM

## 2015-09-04 DIAGNOSIS — I4891 Unspecified atrial fibrillation: Secondary | ICD-10-CM

## 2015-09-04 DIAGNOSIS — Z5181 Encounter for therapeutic drug level monitoring: Secondary | ICD-10-CM

## 2015-09-04 LAB — POCT INR: INR: 2

## 2015-09-12 ENCOUNTER — Ambulatory Visit (INDEPENDENT_AMBULATORY_CARE_PROVIDER_SITE_OTHER): Payer: Medicare Other | Admitting: *Deleted

## 2015-09-12 DIAGNOSIS — I4891 Unspecified atrial fibrillation: Secondary | ICD-10-CM | POA: Diagnosis not present

## 2015-09-12 DIAGNOSIS — Z5181 Encounter for therapeutic drug level monitoring: Secondary | ICD-10-CM | POA: Diagnosis not present

## 2015-09-12 DIAGNOSIS — Z7901 Long term (current) use of anticoagulants: Secondary | ICD-10-CM

## 2015-09-12 LAB — POCT INR: INR: 2.1

## 2015-09-14 ENCOUNTER — Ambulatory Visit
Admission: RE | Admit: 2015-09-14 | Discharge: 2015-09-14 | Disposition: A | Discharge status: Home / Self Care | Payer: Medicare Other | Source: Ambulatory Visit | Attending: Family Medicine | Admitting: Family Medicine

## 2015-09-14 ENCOUNTER — Other Ambulatory Visit: Payer: Self-pay | Admitting: Family Medicine

## 2015-09-14 DIAGNOSIS — R079 Chest pain, unspecified: Secondary | ICD-10-CM

## 2015-09-26 ENCOUNTER — Ambulatory Visit (INDEPENDENT_AMBULATORY_CARE_PROVIDER_SITE_OTHER): Payer: Medicare Other | Admitting: Internal Medicine

## 2015-09-26 ENCOUNTER — Other Ambulatory Visit: Payer: Self-pay

## 2015-09-26 ENCOUNTER — Encounter: Payer: Self-pay | Admitting: Internal Medicine

## 2015-09-26 VITALS — BP 132/84 | HR 72 | Ht 66.0 in | Wt 191.4 lb

## 2015-09-26 DIAGNOSIS — I10 Essential (primary) hypertension: Secondary | ICD-10-CM

## 2015-09-26 DIAGNOSIS — I482 Chronic atrial fibrillation, unspecified: Secondary | ICD-10-CM

## 2015-09-26 DIAGNOSIS — I6522 Occlusion and stenosis of left carotid artery: Secondary | ICD-10-CM

## 2015-09-26 DIAGNOSIS — I071 Rheumatic tricuspid insufficiency: Secondary | ICD-10-CM | POA: Diagnosis not present

## 2015-09-26 NOTE — Patient Instructions (Signed)
Medication Instructions:  Your physician recommends that you continue on your current medications as directed. Please refer to the Current Medication list given to you today.   Labwork: None ordered  Testing/Procedures: Your physician has requested that you have an echocardiogram. Echocardiography is a painless test that uses sound waves to create images of your heart. It provides your doctor with information about the size and shape of your heart and how well your heart's chambers and valves are working. This procedure takes approximately one hour. There are no restrictions for this procedure.      Follow-Up: Your physician wants you to follow-up in: 6 months with Truitt Merle, NP and 12 months with Dr Vallery Ridge will receive a reminder letter in the mail two months in advance. If you don't receive a letter, please call our office to schedule the follow-up appointment.   Any Other Special Instructions Will Be Listed Below (If Applicable).

## 2015-09-26 NOTE — Progress Notes (Signed)
PCP: Marylene Land, MD  The patient presents today for routine cardiology followup.  Since last being seen in our clinic, the patient reports doing reasonably well. Her exercise tolerance is stable.  She reports occasional L chest wall soreness which is unchanged.  She denies exertional symptoms.     Past Medical History  Diagnosis Date  . HTN (hypertension) 1983  . Persistent atrial fibrillation 2009  . Anxiety age 81  . Hyperlipidemia 2000  . DDD (degenerative disc disease)   . DJD (degenerative joint disease)   . Vertigo   . GERD (gastroesophageal reflux disease) 2003  . Osteoporosis 2001  . Hypothyroidism 2009  . Adenomatous colon polyp 2005  . Vitamin D deficiency 2011  . Gastroenteritis 2011  . History of pneumonia   . Iron deficiency anemia 1983  . Persistent headaches 1987  . Osteoarthritis 2001  . Carotid artery bruit 2001    left  . Tricuspid regurgitation     severe by echo 02/25/12   Past Surgical History  Procedure Laterality Date  . S/p blt    . Colonoscopy w/ biopsies and polypectomy  09/16/2004    adenomatous polyp, diverticulosis, internal hemorrhoids  . Tubal ligation      Current Outpatient Prescriptions  Medication Sig Dispense Refill  . Bepotastine Besilate 1.5 % SOLN Place 1 drop into both eyes 4 (four) times daily.    Marland Kitchen COUMADIN 2 MG tablet TAKE AS DIRECTED BY COUMADIN CLINIC 35 tablet 3  . diltiazem (DILACOR XR) 240 MG 24 hr capsule Take 240 mg by mouth daily.    Marland Kitchen levothyroxine (SYNTHROID, LEVOTHROID) 25 MCG tablet Take 25 mcg by mouth daily.      Marland Kitchen LORazepam (ATIVAN) 1 MG tablet Take 1 mg by mouth 2 (two) times daily as needed (nervousness).     . metoprolol succinate (TOPROL-XL) 50 MG 24 hr tablet Take 1 tablet (50 mg total) by mouth daily. 90 tablet 3  . omeprazole (PRILOSEC) 20 MG capsule Take 20 mg by mouth daily as needed (heartburn).     . triamterene-hydrochlorothiazide (MAXZIDE-25) 37.5-25 MG per tablet Take 0.5 tablets by mouth daily.      Marland Kitchen ZOCOR 80 MG tablet Take 0.25 tablets by mouth daily.  11   No current facility-administered medications for this visit.    Allergies  Allergen Reactions  . Meclizine Other (See Comments)    Tachycardia  . Pravastatin Other (See Comments)    Arthralgia, Myalgias  . Zetia [Ezetimibe] Other (See Comments)    Arthralgia, Myalgias  . Ace Inhibitors Cough  . Codeine Nausea Only  . Colestipol Hcl Other (See Comments)    Malaise  . Doxycycline Other (See Comments)    UNKNOWN  . Lovastatin Other (See Comments)    UNKNOWN  . Sulfa Antibiotics Other (See Comments)    unknown    Social History   Social History  . Marital Status: Widowed    Spouse Name: N/A  . Number of Children: 7  . Years of Education: N/A   Occupational History  . Retired     [employer]   Social History Main Topics  . Smoking status: Never Smoker   . Smokeless tobacco: Never Used  . Alcohol Use: No  . Drug Use: No  . Sexual Activity: Not on file   Other Topics Concern  . Not on file   Social History Narrative    Family History  Problem Relation Age of Onset  . Hypertension Father   . Diabetes  Mother   . Peripheral vascular disease Father   . Heart disease Brother 86  . Liver cancer Sister    Physical Exam: Filed Vitals:   09/26/15 1339  BP: 132/84  Pulse: 72  Height: 5\' 6"  (1.676 m)  Weight: 191 lb 6.4 oz (86.818 kg)    GEN- The patient is well appearing, alert and oriented x 3 today.   Head- normocephalic, atraumatic Eyes-  Sclera clear, conjunctiva pink Ears- hearing intact Oropharynx- clear Neck- supple, no JVP Lymph- no cervical lymphadenopathy Lungs- Clear to ausculation bilaterally, normal work of breathing Heart- irregular rate and rhythm GI- soft, NT, ND, + BS Extremities- no clubbing, cyanosis, or edema  Echo 9/14 is reviewed Ekg today reveals afib, V rate  72 bpm, septal infarct  Assessment and Plan: 1. Permanent afib Rate controlled Anticoagulated No changes  at this time    2. Severe TR Remains symptom free Echo 2/16 is reviewed She wants to avoid surgery  3. HL Followed by Dr Sandi Mariscal  4. HTN Stable . No change required today  Return in 6 months to see Truitt Merle I will see again in 1 year  Thompson Grayer MD, [facility] 09/26/2015 2:18 PM

## 2015-09-28 ENCOUNTER — Telehealth: Payer: Self-pay | Admitting: *Deleted

## 2015-09-28 NOTE — Telephone Encounter (Signed)
Patient called and stated that she has been prescribed Fosamax 70mg .  She stated she will be taking it once a week.  Advised that she can take Fosamax with Coumadin and to call back with any other new or different medications and she verbalized understanding.

## 2015-10-22 ENCOUNTER — Other Ambulatory Visit: Payer: Self-pay | Admitting: Internal Medicine

## 2015-10-23 ENCOUNTER — Ambulatory Visit (INDEPENDENT_AMBULATORY_CARE_PROVIDER_SITE_OTHER): Payer: Medicare Other | Admitting: *Deleted

## 2015-10-23 DIAGNOSIS — I482 Chronic atrial fibrillation, unspecified: Secondary | ICD-10-CM

## 2015-10-23 DIAGNOSIS — Z5181 Encounter for therapeutic drug level monitoring: Secondary | ICD-10-CM

## 2015-10-23 DIAGNOSIS — Z7901 Long term (current) use of anticoagulants: Secondary | ICD-10-CM

## 2015-10-23 DIAGNOSIS — I4891 Unspecified atrial fibrillation: Secondary | ICD-10-CM | POA: Diagnosis not present

## 2015-10-23 LAB — POCT INR: INR: 2.3

## 2015-12-04 ENCOUNTER — Ambulatory Visit (INDEPENDENT_AMBULATORY_CARE_PROVIDER_SITE_OTHER): Payer: Medicare Other | Admitting: *Deleted

## 2015-12-04 DIAGNOSIS — Z7901 Long term (current) use of anticoagulants: Secondary | ICD-10-CM

## 2015-12-04 DIAGNOSIS — I4891 Unspecified atrial fibrillation: Secondary | ICD-10-CM

## 2015-12-04 DIAGNOSIS — I482 Chronic atrial fibrillation, unspecified: Secondary | ICD-10-CM

## 2015-12-04 DIAGNOSIS — Z5181 Encounter for therapeutic drug level monitoring: Secondary | ICD-10-CM

## 2015-12-04 LAB — POCT INR: INR: 2.1

## 2016-01-15 ENCOUNTER — Ambulatory Visit (INDEPENDENT_AMBULATORY_CARE_PROVIDER_SITE_OTHER): Payer: Medicare Other | Admitting: *Deleted

## 2016-01-15 DIAGNOSIS — I4891 Unspecified atrial fibrillation: Secondary | ICD-10-CM | POA: Diagnosis not present

## 2016-01-15 DIAGNOSIS — Z7901 Long term (current) use of anticoagulants: Secondary | ICD-10-CM | POA: Diagnosis not present

## 2016-01-15 DIAGNOSIS — Z5181 Encounter for therapeutic drug level monitoring: Secondary | ICD-10-CM | POA: Diagnosis not present

## 2016-01-15 DIAGNOSIS — I482 Chronic atrial fibrillation, unspecified: Secondary | ICD-10-CM

## 2016-01-15 LAB — POCT INR: INR: 2.4

## 2016-02-26 ENCOUNTER — Ambulatory Visit (INDEPENDENT_AMBULATORY_CARE_PROVIDER_SITE_OTHER): Payer: Medicare Other

## 2016-02-26 DIAGNOSIS — Z5181 Encounter for therapeutic drug level monitoring: Secondary | ICD-10-CM

## 2016-02-26 DIAGNOSIS — I4891 Unspecified atrial fibrillation: Secondary | ICD-10-CM

## 2016-02-26 DIAGNOSIS — I482 Chronic atrial fibrillation, unspecified: Secondary | ICD-10-CM

## 2016-02-26 DIAGNOSIS — Z7901 Long term (current) use of anticoagulants: Secondary | ICD-10-CM

## 2016-02-26 LAB — POCT INR: INR: 2

## 2016-03-17 ENCOUNTER — Ambulatory Visit (HOSPITAL_COMMUNITY): Payer: Medicare Other | Attending: Cardiology

## 2016-03-17 ENCOUNTER — Other Ambulatory Visit: Payer: Self-pay

## 2016-03-17 ENCOUNTER — Other Ambulatory Visit: Payer: Self-pay | Admitting: Internal Medicine

## 2016-03-17 DIAGNOSIS — I482 Chronic atrial fibrillation, unspecified: Secondary | ICD-10-CM

## 2016-03-17 DIAGNOSIS — I351 Nonrheumatic aortic (valve) insufficiency: Secondary | ICD-10-CM | POA: Diagnosis not present

## 2016-03-17 DIAGNOSIS — I340 Nonrheumatic mitral (valve) insufficiency: Secondary | ICD-10-CM | POA: Diagnosis not present

## 2016-03-17 DIAGNOSIS — I119 Hypertensive heart disease without heart failure: Secondary | ICD-10-CM | POA: Diagnosis not present

## 2016-03-17 DIAGNOSIS — I313 Pericardial effusion (noninflammatory): Secondary | ICD-10-CM | POA: Diagnosis not present

## 2016-03-17 DIAGNOSIS — E785 Hyperlipidemia, unspecified: Secondary | ICD-10-CM | POA: Insufficient documentation

## 2016-03-17 DIAGNOSIS — I071 Rheumatic tricuspid insufficiency: Secondary | ICD-10-CM

## 2016-03-17 DIAGNOSIS — I4891 Unspecified atrial fibrillation: Secondary | ICD-10-CM | POA: Diagnosis present

## 2016-03-26 ENCOUNTER — Ambulatory Visit (INDEPENDENT_AMBULATORY_CARE_PROVIDER_SITE_OTHER): Payer: Medicare Other | Admitting: Nurse Practitioner

## 2016-03-26 ENCOUNTER — Encounter: Payer: Self-pay | Admitting: Nurse Practitioner

## 2016-03-26 VITALS — BP 136/84 | HR 72 | Ht 66.0 in | Wt 183.1 lb

## 2016-03-26 DIAGNOSIS — I482 Chronic atrial fibrillation, unspecified: Secondary | ICD-10-CM

## 2016-03-26 DIAGNOSIS — Z7901 Long term (current) use of anticoagulants: Secondary | ICD-10-CM | POA: Diagnosis not present

## 2016-03-26 NOTE — Progress Notes (Signed)
CARDIOLOGY OFFICE NOTE  Date:  03/26/2016    [name] Date of Birth: 05/14/1930 Medical Record [id number]  PCP:  Marylene Land, MD  Cardiologist:  Allred    Chief Complaint  Patient presents with  . Atrial Fibrillation    Follow up visit - seen for Dr. Rayann Heman    History of Present Illness: Lori Bridges is a 82 y.o. female who presents today for a follow up visit. Seen for Dr. Rayann Heman.  She has a history of HTN, chronic AF, chronic coumadin, HLD, DJD, GERD and hypothyroidism.  Last seen back in September - felt to be doing ok.   Comes back today. Here alone.  She says she is doing ok. She has some palpable chest wall pain - this is chronic. Was told by her PCP it was costochondritis. Nothing exertional. She says her breathing is stable. She has had "bad nerves" ever since she was a child. Not dizzy or lightheaded. No syncope. She did have a fall about 3 weeks ago - sat on a rolling chair and it rolled out from under her. Her bottom got a little sore but is improving. She remains on her coumadin - no problems noted. Not bleeding or bruising.   Past Medical History  Diagnosis Date  . HTN (hypertension) 1983  . Persistent atrial fibrillation ([place]) 2009  . Anxiety age 55  . Hyperlipidemia 2000  . DDD (degenerative disc disease)   . DJD (degenerative joint disease)   . Vertigo   . GERD (gastroesophageal reflux disease) 2003  . Osteoporosis 2001  . Hypothyroidism 2009  . Adenomatous colon polyp 2005  . Vitamin D deficiency 2011  . Gastroenteritis 2011  . History of pneumonia   . Iron deficiency anemia 1983  . Persistent headaches 1987  . Osteoarthritis 2001  . Carotid artery bruit 2001    left  . Tricuspid regurgitation     severe by echo 02/25/12    Past Surgical History  Procedure Laterality Date  . S/p blt    . Colonoscopy w/ biopsies and polypectomy  09/16/2004    adenomatous polyp, diverticulosis, internal hemorrhoids  . Tubal ligation        Medications: Current Outpatient Prescriptions  Medication Sig Dispense Refill  . Cholecalciferol (VITAMIN D3) 2000 units TABS Take 2,000 Units by mouth daily.    Marland Kitchen COUMADIN 2 MG tablet TAKE AS DIRECTED BY COUMADIN CLINIC 35 tablet 3  . diltiazem (DILACOR XR) 240 MG 24 hr capsule Take 240 mg by mouth daily.    Marland Kitchen LORazepam (ATIVAN) 1 MG tablet Take 1 mg by mouth 2 (two) times daily as needed (nervousness).     Marland Kitchen losartan (COZAAR) 50 MG tablet TK 1 T PO  QAM  4  . metoprolol succinate (TOPROL-XL) 50 MG 24 hr tablet Take 1 tablet (50 mg total) by mouth daily. 90 tablet 3  . SYNTHROID 50 MCG tablet TK 1 T PO  QD OES  8  . triamcinolone cream (KENALOG) 0.1 % APPLY TO RASH BID PRN  1  . triamterene-hydrochlorothiazide (MAXZIDE-25) 37.5-25 MG per tablet Take 0.5 tablets by mouth daily.     Marland Kitchen ZOCOR 80 MG tablet Take 0.25 tablets by mouth daily.  11   No current facility-administered medications for this visit.    Allergies: Allergies  Allergen Reactions  . Meclizine Other (See Comments)    Tachycardia  . Pravastatin Other (See Comments)    Arthralgia, Myalgias  . Zetia [Ezetimibe] Other (See  Comments)    Arthralgia, Myalgias  . Ace Inhibitors Cough  . Codeine Nausea Only  . Colestipol Hcl Other (See Comments)    Malaise  . Doxycycline Other (See Comments)    UNKNOWN  . Lovastatin Other (See Comments)    UNKNOWN  . Sulfa Antibiotics Other (See Comments)    unknown    Social History: The patient  reports that she has never smoked. She has never used smokeless tobacco. She reports that she does not drink alcohol or use illicit drugs.   Family History: The patient's family history includes Diabetes in her mother; Heart disease (age of onset: 19) in her brother; Hypertension in her father; Liver cancer in her sister; Peripheral vascular disease in her father.   Review of Systems: Please see the history of present illness.   Otherwise, the review of systems is positive for  allergy type symptoms.   All other systems are reviewed and negative.   Physical Exam: VS:  BP 136/84 mmHg  Pulse 72  Ht 5\' 6"  (1.676 m)  Wt 183 lb 1.9 oz (83.063 kg)  BMI 29.57 kg/m2 .  BMI Body mass index is 29.57 kg/(m^2).  Wt Readings from Last 3 Encounters:  03/26/16 183 lb 1.9 oz (83.063 kg)  09/26/15 191 lb 6.4 oz (86.818 kg)  03/07/15 190 lb 12.8 oz (86.546 kg)    General: Pleasant. Elderly black female who is alert and in no acute distress.  HEENT: Normal. Neck: Supple, no JVD, carotid bruits, or masses noted.  Cardiac: Irregular irregular rhythm. Rate ok. Soft murmur. No edema.  Respiratory:  Lungs are clear to auscultation bilaterally with normal work of breathing.  GI: Soft and nontender.  MS: No deformity or atrophy. Gait and ROM intact. Skin: Warm and dry. Color is normal.  Neuro:  Strength and sensation are intact and no gross focal deficits noted.  Psych: Alert, appropriate and with normal affect.   LABORATORY DATA:  EKG:  EKG is not ordered today.  Lab Results  Component Value Date   WBC 4.6 02/16/2015   HGB 16.0* 02/16/2015   HCT 47.8* 02/16/2015   PLT 192.0 02/16/2015   GLUCOSE 106* 02/16/2015   ALT 14 08/25/2010   AST 23 08/25/2010   NA 139 02/16/2015   K 3.9 02/16/2015   CL 104 02/16/2015   CREATININE 1.40* 02/16/2015   BUN 24* 02/16/2015   CO2 29 02/16/2015   TSH 3.65 02/16/2015   INR 2.0 02/26/2016   Lab Results  Component Value Date   INR 2.0 02/26/2016   INR 2.4 01/15/2016   INR 2.1 12/04/2015    BNP (last 3 results) No results for input(s): BNP in the last 8760 hours.  ProBNP (last 3 results) No results for input(s): PROBNP in the last 8760 hours.   Other Studies Reviewed Today:  Echo Study Conclusions from 02/2016  - Left ventricle: The cavity size was normal. Wall thickness was  normal. Systolic function was normal. The estimated ejection  fraction was in the range of 55% to 60%. Wall motion was normal;  there were  no regional wall motion abnormalities. - Ventricular septum: The contour showed diastolic flattening and  systolic flattening. - Aortic valve: There was mild regurgitation. - Mitral valve: There was mild regurgitation. - Left atrium: The atrium was severely dilated. - Right ventricle: The cavity size was mildly dilated. - Right atrium: The atrium was severely dilated. - Tricuspid valve: There was severe regurgitation. - Pericardium, extracardiac: A trivial pericardial effusion was  identified.  Impressions:  - Normal LV systolic function; D shaped septum suggestive of  pulmonary hyertension; severe biatrial enlargement; mild AI and  MR; mild RVE; severe TR.  Notes Recorded by Thompson Grayer, MD on 03/18/2016 at 8:55 PM Results reviewed. Please inform pt of result. Cecille Rubin can discuss further when she sees next week. Cecille Rubin, she has known severe TR previously without symptoms or desire for surgical consultation. EF remains preserved. Permanent afib.  Assessment/Plan:  1. Chronic atrial fib - managed with rate control and anticoagulation - felt to be doing well. She seems to be holding her own and would continue with her current regimen.   2. Chronic coumadin anticoagulation - no problems noted.   3. Severe TR - has normal EF - would continue with conservative management.   4. HTN - BP ok on her current regimen.   Current medicines are reviewed with the patient today.  The patient does not have concerns regarding medicines other than what has been noted above.  The following changes have been made:  See above.  Labs/ tests ordered today include:   No orders of the defined types were placed in this encounter.     Disposition:   FU with Dr. Rayann Heman and his team going forward.   Patient is agreeable to this plan and will call if any problems develop in the interim.   Signed: Burtis Junes, RN, ANP-C 03/26/2016 1:53 PM  [facility] [address] [place], [place]  [address] Phone: [phone] Fax: [phone]

## 2016-03-26 NOTE — Patient Instructions (Addendum)
We will be checking the following labs today - NONE   Medication Instructions:    Continue with your current medicines.     Testing/Procedures To Be Arranged:  N/A  Follow-Up:   See Dr. Rayann Heman and his team in 6 months as planned.    Other Special Instructions:   N/A    If you need a refill on your cardiac medications before your next appointment, please call your pharmacy.   Call the [place] office at [phone] if you have any questions, problems or concerns.

## 2016-04-08 ENCOUNTER — Ambulatory Visit (INDEPENDENT_AMBULATORY_CARE_PROVIDER_SITE_OTHER): Payer: Medicare Other | Admitting: *Deleted

## 2016-04-08 DIAGNOSIS — Z7901 Long term (current) use of anticoagulants: Secondary | ICD-10-CM | POA: Diagnosis not present

## 2016-04-08 DIAGNOSIS — I482 Chronic atrial fibrillation, unspecified: Secondary | ICD-10-CM

## 2016-04-08 DIAGNOSIS — I4891 Unspecified atrial fibrillation: Secondary | ICD-10-CM | POA: Diagnosis not present

## 2016-04-08 DIAGNOSIS — Z5181 Encounter for therapeutic drug level monitoring: Secondary | ICD-10-CM

## 2016-04-08 LAB — POCT INR: INR: 1.9

## 2016-05-06 ENCOUNTER — Telehealth: Payer: Self-pay | Admitting: *Deleted

## 2016-05-06 NOTE — Telephone Encounter (Signed)
Pt called to inform us that she will take Diflucan 150mg  times 1 dose today.  Advised to eat a serving of Vitamin K foods, such as cabbage or collards, etc & she verbalized understanding. Advised to call back with any other issues & she verbalized understanding.

## 2016-05-20 ENCOUNTER — Ambulatory Visit (INDEPENDENT_AMBULATORY_CARE_PROVIDER_SITE_OTHER): Payer: Medicare Other | Admitting: *Deleted

## 2016-05-20 DIAGNOSIS — I482 Chronic atrial fibrillation, unspecified: Secondary | ICD-10-CM

## 2016-05-20 DIAGNOSIS — I4891 Unspecified atrial fibrillation: Secondary | ICD-10-CM | POA: Diagnosis not present

## 2016-05-20 DIAGNOSIS — Z5181 Encounter for therapeutic drug level monitoring: Secondary | ICD-10-CM

## 2016-05-20 DIAGNOSIS — Z7901 Long term (current) use of anticoagulants: Secondary | ICD-10-CM

## 2016-05-20 LAB — POCT INR: INR: 2.5

## 2016-06-03 ENCOUNTER — Other Ambulatory Visit: Payer: Self-pay | Admitting: Family Medicine

## 2016-06-03 DIAGNOSIS — R1310 Dysphagia, unspecified: Secondary | ICD-10-CM

## 2016-06-03 DIAGNOSIS — J029 Acute pharyngitis, unspecified: Secondary | ICD-10-CM

## 2016-06-06 ENCOUNTER — Other Ambulatory Visit: Payer: Medicare Other

## 2016-06-20 ENCOUNTER — Telehealth: Payer: Self-pay | Admitting: *Deleted

## 2016-06-20 NOTE — Telephone Encounter (Signed)
Patient called to inform us that she was prescribed Meclizine 12.5mg  as needed for dizziness. She stated she will start the medication today for her dizziness. Instructed the pt that the medication is safe to take with Coumadin & she verbalized understanding.

## 2016-06-24 ENCOUNTER — Ambulatory Visit (INDEPENDENT_AMBULATORY_CARE_PROVIDER_SITE_OTHER): Payer: Medicare Other

## 2016-06-24 DIAGNOSIS — Z5181 Encounter for therapeutic drug level monitoring: Secondary | ICD-10-CM

## 2016-06-24 DIAGNOSIS — I482 Chronic atrial fibrillation, unspecified: Secondary | ICD-10-CM

## 2016-06-24 DIAGNOSIS — Z7901 Long term (current) use of anticoagulants: Secondary | ICD-10-CM | POA: Diagnosis not present

## 2016-06-24 DIAGNOSIS — I4891 Unspecified atrial fibrillation: Secondary | ICD-10-CM

## 2016-06-24 LAB — POCT INR: INR: 2.1

## 2016-06-30 ENCOUNTER — Ambulatory Visit (INDEPENDENT_AMBULATORY_CARE_PROVIDER_SITE_OTHER): Payer: Medicare Other

## 2016-06-30 DIAGNOSIS — R42 Dizziness and giddiness: Secondary | ICD-10-CM | POA: Diagnosis not present

## 2016-07-16 ENCOUNTER — Telehealth: Payer: Self-pay | Admitting: Internal Medicine

## 2016-07-16 NOTE — Telephone Encounter (Signed)
New message     The office needs the holiter monitor report faxed to them from July 3 rd at fax number [phone]

## 2016-07-18 NOTE — Telephone Encounter (Signed)
Follow-up      The office needs the results faxed to the office today from the Holter monitor to [phone]

## 2016-07-18 NOTE — Telephone Encounter (Signed)
Dr Meda Coffee reviewed and I will fax to there office now

## 2016-07-18 NOTE — Telephone Encounter (Signed)
F/u    Pt calling to get results of Holter monitor performed on 06/30/16. Please advise.

## 2016-08-05 ENCOUNTER — Encounter (INDEPENDENT_AMBULATORY_CARE_PROVIDER_SITE_OTHER): Payer: Self-pay

## 2016-08-05 ENCOUNTER — Ambulatory Visit (INDEPENDENT_AMBULATORY_CARE_PROVIDER_SITE_OTHER): Payer: Medicare Other | Admitting: *Deleted

## 2016-08-05 DIAGNOSIS — Z7901 Long term (current) use of anticoagulants: Secondary | ICD-10-CM

## 2016-08-05 DIAGNOSIS — I4891 Unspecified atrial fibrillation: Secondary | ICD-10-CM

## 2016-08-05 DIAGNOSIS — Z5181 Encounter for therapeutic drug level monitoring: Secondary | ICD-10-CM

## 2016-08-05 LAB — POCT INR: INR: 2

## 2016-08-18 ENCOUNTER — Other Ambulatory Visit: Payer: Self-pay | Admitting: Internal Medicine

## 2016-09-16 ENCOUNTER — Ambulatory Visit (INDEPENDENT_AMBULATORY_CARE_PROVIDER_SITE_OTHER): Payer: Medicare Other | Admitting: *Deleted

## 2016-09-16 DIAGNOSIS — Z7901 Long term (current) use of anticoagulants: Secondary | ICD-10-CM | POA: Diagnosis not present

## 2016-09-16 DIAGNOSIS — Z5181 Encounter for therapeutic drug level monitoring: Secondary | ICD-10-CM | POA: Diagnosis not present

## 2016-09-16 DIAGNOSIS — I4891 Unspecified atrial fibrillation: Secondary | ICD-10-CM | POA: Diagnosis not present

## 2016-09-16 LAB — POCT INR: INR: 2

## 2016-09-24 ENCOUNTER — Ambulatory Visit (INDEPENDENT_AMBULATORY_CARE_PROVIDER_SITE_OTHER): Payer: Medicare Other | Admitting: Internal Medicine

## 2016-09-24 ENCOUNTER — Encounter: Payer: Self-pay | Admitting: Internal Medicine

## 2016-09-24 VITALS — BP 128/80 | HR 77 | Ht 66.0 in | Wt 181.6 lb

## 2016-09-24 DIAGNOSIS — Z7901 Long term (current) use of anticoagulants: Secondary | ICD-10-CM

## 2016-09-24 DIAGNOSIS — I482 Chronic atrial fibrillation: Secondary | ICD-10-CM

## 2016-09-24 DIAGNOSIS — I10 Essential (primary) hypertension: Secondary | ICD-10-CM | POA: Diagnosis not present

## 2016-09-24 DIAGNOSIS — I4821 Permanent atrial fibrillation: Secondary | ICD-10-CM

## 2016-09-24 DIAGNOSIS — I071 Rheumatic tricuspid insufficiency: Secondary | ICD-10-CM | POA: Diagnosis not present

## 2016-09-24 NOTE — Patient Instructions (Signed)
Medication Instructions:  Your physician recommends that you continue on your current medications as directed. Please refer to the Current Medication list given to you today.   Labwork: None ordered   Testing/Procedures: Your physician has requested that you have an echocardiogram. Echocardiography is a painless test that uses sound waves to create images of your heart. It provides your doctor with information about the size and shape of your heart and how well your heart's chambers and valves are working. This procedure takes approximately one hour. There are no restrictions for this procedure.    Follow-Up: Your physician wants you to follow-up in: 6 months with Tommye Standard, PA You will receive a reminder letter in the mail two months in advance. If you don't receive a letter, please call our office to schedule the follow-up appointment.   Any Other Special Instructions Will Be Listed Below (If Applicable).     If you need a refill on your cardiac medications before your next appointment, please call your pharmacy.

## 2016-09-24 NOTE — Progress Notes (Signed)
PCP: Lori Land, MD  The patient presents today for routine cardiology followup.  Since last being seen in our clinic, the patient reports doing reasonably well. Her exercise tolerance is stable.  She reports occasional L chest wall soreness which is unchanged.  She denies exertional symptoms.   Rare palpitations at night.   Past Medical History:  Diagnosis Date  . Adenomatous colon polyp 2005  . Anxiety age 82  . Carotid artery bruit 2001   left  . DDD (degenerative disc disease)   . DJD (degenerative joint disease)   . Gastroenteritis 2011  . GERD (gastroesophageal reflux disease) 2003  . History of pneumonia   . HTN (hypertension) 1983  . Hyperlipidemia 2000  . Hypothyroidism 2009  . Iron deficiency anemia 1983  . Osteoarthritis 2001  . Osteoporosis 2001  . Persistent atrial fibrillation ([place]) 2009  . Persistent headaches 1987  . Tricuspid regurgitation    severe by echo 02/25/12  . Vertigo   . Vitamin D deficiency 2011   Past Surgical History:  Procedure Laterality Date  . COLONOSCOPY W/ BIOPSIES AND POLYPECTOMY  09/16/2004   adenomatous polyp, diverticulosis, internal hemorrhoids  . s/p BLT    . TUBAL LIGATION      Current Outpatient Prescriptions  Medication Sig Dispense Refill  . Cholecalciferol (VITAMIN D3) 2000 units TABS Take 2,000 Units by mouth daily.    Marland Kitchen COUMADIN 2 MG tablet TAKE AS DIRECTED BY COUMADIN CLINIC 35 tablet 3  . diltiazem (DILACOR XR) 240 MG 24 hr capsule Take 240 mg by mouth daily.    Marland Kitchen LORazepam (ATIVAN) 1 MG tablet Take 1 mg by mouth 2 (two) times daily as needed (nervousness).     Marland Kitchen losartan (COZAAR) 100 MG tablet Take 100 mg by mouth daily.    . metoprolol succinate (TOPROL-XL) 50 MG 24 hr tablet Take 1 tablet (50 mg total) by mouth daily. 90 tablet 3  . SYNTHROID 50 MCG tablet Take 1 tablet by mouth daily OES  8  . triamcinolone cream (KENALOG) 0.1 % APPLY ONE APPLICATION TO RASH TWICE DAILY AS NEEDED. Use as directed  1  .  triamterene-hydrochlorothiazide (MAXZIDE-25) 37.5-25 MG per tablet Take 0.5 tablets by mouth daily.     Marland Kitchen ZOCOR 80 MG tablet Take 0.25 tablets by mouth daily.  11   No current facility-administered medications for this visit.     Allergies  Allergen Reactions  . Meclizine Other (See Comments)    Tachycardia  . Pravastatin Other (See Comments)    Arthralgia, Myalgias  . Zetia [Ezetimibe] Other (See Comments)    Arthralgia, Myalgias  . Ace Inhibitors Cough  . Codeine Nausea Only  . Colestipol Hcl Other (See Comments)    Malaise  . Doxycycline Other (See Comments)    UNKNOWN  . Lovastatin Other (See Comments)    UNKNOWN  . Sulfa Antibiotics Other (See Comments)    unknown    Social History   Social History  . Marital status: Widowed    Spouse name: N/A  . Number of children: 7  . Years of education: N/A   Occupational History  . Retired     [employer]   Social History Main Topics  . Smoking status: Never Smoker  . Smokeless tobacco: Never Used  . Alcohol use No  . Drug use: No  . Sexual activity: Not on file   Other Topics Concern  . Not on file   Social History Narrative  . No narrative on file  Family History  Problem Relation Age of Onset  . Hypertension Father   . Diabetes Mother   . Peripheral vascular disease Father   . Heart disease Brother 49  . Liver cancer Sister    Physical Exam: Vitals:   09/24/16 1524  BP: 128/80  Pulse: 77  Weight: 181 lb 9.6 oz (82.4 kg)  Height: 5\' 6"  (1.676 m)    GEN- The patient is well appearing, alert and oriented x 3 today.   Head- normocephalic, atraumatic Eyes-  Sclera clear, conjunctiva pink Ears- hearing intact Oropharynx- clear Neck- supple, no JVP Lymph- no cervical lymphadenopathy Lungs- Clear to ausculation bilaterally, normal work of breathing Heart- irregular rate and rhythm GI- soft, NT, ND, + BS Extremities- no clubbing, cyanosis, or edema  Echo 3/17 is reviewed Ekg today reveals afib, V  rate  77 bpm, septal infarct  Assessment and Plan: 1. Permanent afib Rate controlled Anticoagulated No changes at this time    2. Severe TR Remains symptom free Echo 3/17 is reviewed She wants to avoid surgery Repeat echo in 6 months As long as EF is preserved and no RV failure, will follow supportively  3. HL Followed by Dr Sandi Mariscal  4. HTN Stable . No change required today  Return in 6 months to see [name] I will see again in 1 year  Thompson Grayer MD, [facility] 09/24/2016 3:40 PM

## 2016-09-29 ENCOUNTER — Ambulatory Visit: Payer: Medicare Other | Admitting: Internal Medicine

## 2016-10-06 ENCOUNTER — Ambulatory Visit
Admission: RE | Admit: 2016-10-06 | Discharge: 2016-10-06 | Disposition: A | Discharge status: Home / Self Care | Payer: Medicare Other | Source: Ambulatory Visit | Attending: Family Medicine | Admitting: Family Medicine

## 2016-10-06 ENCOUNTER — Other Ambulatory Visit: Payer: Self-pay | Admitting: Family Medicine

## 2016-10-06 DIAGNOSIS — R0602 Shortness of breath: Secondary | ICD-10-CM

## 2016-10-17 ENCOUNTER — Other Ambulatory Visit: Payer: Self-pay | Admitting: Family Medicine

## 2016-10-17 ENCOUNTER — Ambulatory Visit
Admission: RE | Admit: 2016-10-17 | Discharge: 2016-10-17 | Disposition: A | Discharge status: Home / Self Care | Payer: Medicare Other | Source: Ambulatory Visit | Attending: Family Medicine | Admitting: Family Medicine

## 2016-10-17 DIAGNOSIS — R509 Fever, unspecified: Secondary | ICD-10-CM

## 2016-10-17 DIAGNOSIS — R059 Cough, unspecified: Secondary | ICD-10-CM

## 2016-10-17 DIAGNOSIS — R05 Cough: Secondary | ICD-10-CM

## 2016-10-21 ENCOUNTER — Ambulatory Visit (INDEPENDENT_AMBULATORY_CARE_PROVIDER_SITE_OTHER): Payer: Medicare Other | Admitting: *Deleted

## 2016-10-21 DIAGNOSIS — Z7901 Long term (current) use of anticoagulants: Secondary | ICD-10-CM

## 2016-10-21 DIAGNOSIS — I4891 Unspecified atrial fibrillation: Secondary | ICD-10-CM

## 2016-10-21 DIAGNOSIS — Z5181 Encounter for therapeutic drug level monitoring: Secondary | ICD-10-CM | POA: Diagnosis not present

## 2016-10-21 LAB — POCT INR: INR: 1.9

## 2016-11-04 ENCOUNTER — Ambulatory Visit (INDEPENDENT_AMBULATORY_CARE_PROVIDER_SITE_OTHER): Payer: Medicare Other | Admitting: *Deleted

## 2016-11-04 DIAGNOSIS — I4891 Unspecified atrial fibrillation: Secondary | ICD-10-CM

## 2016-11-04 DIAGNOSIS — Z7901 Long term (current) use of anticoagulants: Secondary | ICD-10-CM | POA: Diagnosis not present

## 2016-11-04 DIAGNOSIS — Z5181 Encounter for therapeutic drug level monitoring: Secondary | ICD-10-CM | POA: Diagnosis not present

## 2016-11-04 LAB — POCT INR: INR: 1.8

## 2016-11-18 ENCOUNTER — Ambulatory Visit (INDEPENDENT_AMBULATORY_CARE_PROVIDER_SITE_OTHER): Payer: Medicare Other | Admitting: *Deleted

## 2016-11-18 DIAGNOSIS — Z7901 Long term (current) use of anticoagulants: Secondary | ICD-10-CM | POA: Diagnosis not present

## 2016-11-18 DIAGNOSIS — Z5181 Encounter for therapeutic drug level monitoring: Secondary | ICD-10-CM | POA: Diagnosis not present

## 2016-11-18 DIAGNOSIS — I4891 Unspecified atrial fibrillation: Secondary | ICD-10-CM | POA: Diagnosis not present

## 2016-11-18 LAB — POCT INR: INR: 2.8

## 2016-11-26 ENCOUNTER — Encounter (HOSPITAL_COMMUNITY): Payer: Self-pay | Admitting: *Deleted

## 2016-11-26 ENCOUNTER — Emergency Department (HOSPITAL_COMMUNITY)
Admission: EM | Admit: 2016-11-26 | Discharge: 2016-11-26 | Disposition: A | Discharge status: Home / Self Care | Payer: Medicare Other | Attending: Emergency Medicine | Admitting: Emergency Medicine

## 2016-11-26 DIAGNOSIS — J339 Nasal polyp, unspecified: Secondary | ICD-10-CM | POA: Insufficient documentation

## 2016-11-26 DIAGNOSIS — E039 Hypothyroidism, unspecified: Secondary | ICD-10-CM | POA: Diagnosis not present

## 2016-11-26 DIAGNOSIS — R110 Nausea: Secondary | ICD-10-CM | POA: Diagnosis present

## 2016-11-26 DIAGNOSIS — J3489 Other specified disorders of nose and nasal sinuses: Secondary | ICD-10-CM

## 2016-11-26 DIAGNOSIS — I10 Essential (primary) hypertension: Secondary | ICD-10-CM | POA: Insufficient documentation

## 2016-11-26 LAB — COMPREHENSIVE METABOLIC PANEL
ALBUMIN: 4 g/dL (ref 3.5–5.0)
ALT: 17 U/L (ref 14–54)
ANION GAP: 8 (ref 5–15)
AST: 26 U/L (ref 15–41)
Alkaline Phosphatase: 112 U/L (ref 38–126)
BILIRUBIN TOTAL: 1 mg/dL (ref 0.3–1.2)
BUN: 11 mg/dL (ref 6–20)
CHLORIDE: 105 mmol/L (ref 101–111)
CO2: 27 mmol/L (ref 22–32)
Calcium: 10.7 mg/dL — ABNORMAL HIGH (ref 8.9–10.3)
Creatinine, Ser: 1.25 mg/dL — ABNORMAL HIGH (ref 0.44–1.00)
GFR calc Af Amer: 44 mL/min — ABNORMAL LOW (ref >60)
GFR calc non Af Amer: 38 mL/min — ABNORMAL LOW (ref >60)
GLUCOSE: 111 mg/dL — AB (ref 65–99)
POTASSIUM: 4.2 mmol/L (ref 3.5–5.1)
SODIUM: 140 mmol/L (ref 135–145)
TOTAL PROTEIN: 7.2 g/dL (ref 6.5–8.1)

## 2016-11-26 LAB — CBC
HEMATOCRIT: 48 % — AB (ref 36.0–46.0)
HEMOGLOBIN: 15.8 g/dL — AB (ref 12.0–15.0)
MCH: 29.1 pg (ref 26.0–34.0)
MCHC: 32.9 g/dL (ref 30.0–36.0)
MCV: 88.4 fL (ref 78.0–100.0)
Platelets: 203 10*3/uL (ref 150–400)
RBC: 5.43 MIL/uL — ABNORMAL HIGH (ref 3.87–5.11)
RDW: 13.6 % (ref 11.5–15.5)
WBC: 5.9 10*3/uL (ref 4.0–10.5)

## 2016-11-26 NOTE — Discharge Instructions (Signed)
As discussed, today's evaluation has been largely reassuring. There is some evidence for an infection about the base of your left nostril, as well as a possible nasal polyp. Please use the previously prescribed medication, 3 times daily and be sure to follow-up with our ENT specialist.  Return here for concerning changes in your condition.

## 2016-11-26 NOTE — ED Provider Notes (Signed)
[facility] DEPT Provider Note   CSN: [id number] Arrival date & time: 11/26/16  1242     History   Chief Complaint Chief Complaint  Patient presents with  . Nausea  . Sinusitis    HPI Lori Bridges is a 82 y.o. female.  HPI  Patient presents with concern of left nostril lesions, ongoing nausea. She acknowledges multiple medical issues, recent episode of pneumonia. However, she notes that over the past days, 2 week she has noticed white spots on the inside of her left nostril, as well as occasionally visible pink piece of tissue. Her respiratory complaints resolved, she has no abdominal pain, she has no fever, no chills, no epistaxis, no rhinorrhea, no vomiting. She completed all antibiotics for her recent pneumonia.  She has seen her primary care physician, and received a prescription for mupirocin. However, she is only use this medication once, yesterday for her nasal condition.  She does describe some nausea, though typically with eating, and none presently.  Patient is here with 2 daughters who assist with the history of present illness, acknowledges that the patient has not been using medication as previously prescribed.   Past Medical History:  Diagnosis Date  . Adenomatous colon polyp 2005  . Anxiety age 67  . Carotid artery bruit 2001   left  . DDD (degenerative disc disease)   . DJD (degenerative joint disease)   . Gastroenteritis 2011  . GERD (gastroesophageal reflux disease) 2003  . History of pneumonia   . HTN (hypertension) 1983  . Hyperlipidemia 2000  . Hypothyroidism 2009  . Iron deficiency anemia 1983  . Osteoarthritis 2001  . Osteoporosis 2001  . Persistent atrial fibrillation ([place]) 2009  . Persistent headaches 1987  . Tricuspid regurgitation    severe by echo 02/25/12  . Vertigo   . Vitamin D deficiency 2011    Patient Active Problem List   Diagnosis Date Noted  . Dizziness 06/30/2016  . Encounter for therapeutic drug monitoring  01/24/2014  . Hyperlipidemia 08/24/2013  . Long term (current) use of anticoagulants 05/17/2013  . Tricuspid regurgitation 03/04/2012  . Nausea 02/16/2012  . Personal history of colonic polyps 10/09/2011  . HYPERTENSION, MILD 09/11/2010  . ATRIAL FIBRILLATION ON WARFARIN 07/23/2010    Past Surgical History:  Procedure Laterality Date  . COLONOSCOPY W/ BIOPSIES AND POLYPECTOMY  09/16/2004   adenomatous polyp, diverticulosis, internal hemorrhoids  . s/p BLT    . TUBAL LIGATION      OB History    No data available       Home Medications    Prior to Admission medications   Medication Sig Start Date End Date Taking? Authorizing Provider  Cholecalciferol (VITAMIN D3) 2000 units TABS Take 2,000 Units by mouth daily.    Historical Provider, MD  COUMADIN 2 MG tablet TAKE AS DIRECTED BY COUMADIN CLINIC 08/18/16   Thompson Grayer, MD  diltiazem (DILACOR XR) 240 MG 24 hr capsule Take 240 mg by mouth daily.    Historical Provider, MD  LORazepam (ATIVAN) 1 MG tablet Take 1 mg by mouth 2 (two) times daily as needed (nervousness).     Historical Provider, MD  losartan (COZAAR) 100 MG tablet Take 100 mg by mouth daily.    Historical Provider, MD  metoprolol succinate (TOPROL-XL) 50 MG 24 hr tablet Take 1 tablet (50 mg total) by mouth daily. 09/18/14   Thompson Grayer, MD  SYNTHROID 50 MCG tablet Take 1 tablet by mouth daily OES 03/01/16   Historical Provider, MD  triamcinolone cream (KENALOG) 0.1 % APPLY ONE APPLICATION TO RASH TWICE DAILY AS NEEDED. Use as directed 03/12/16   Historical Provider, MD  triamterene-hydrochlorothiazide (MAXZIDE-25) 37.5-25 MG per tablet Take 0.5 tablets by mouth daily.     Historical Provider, MD  ZOCOR 80 MG tablet Take 0.25 tablets by mouth daily. 09/10/15   Historical Provider, MD    Family History Family History  Problem Relation Age of Onset  . Hypertension Father   . Peripheral vascular disease Father   . Diabetes Mother   . Heart disease Brother 64  . Liver  cancer Sister     Social History Social History  Substance Use Topics  . Smoking status: Never Smoker  . Smokeless tobacco: Never Used  . Alcohol use No     Allergies   Meclizine; Pravastatin; Zetia [ezetimibe]; Ace inhibitors; Codeine; Colestipol hcl; Doxycycline; Lovastatin; and Sulfa antibiotics   Review of Systems Review of Systems  Constitutional:       Per HPI, otherwise negative  HENT:       Per HPI, otherwise negative  Respiratory:       Per HPI, otherwise negative  Cardiovascular:       Per HPI, otherwise negative  Gastrointestinal: Positive for nausea. Negative for vomiting.  Endocrine:       Negative aside from HPI  Genitourinary:       Neg aside from HPI   Musculoskeletal:       Per HPI, otherwise negative  Skin: Negative.   Neurological: Negative for syncope.     Physical Exam Updated Vital Signs BP 142/93   Pulse 95   Temp 97.9 F (36.6 C) (Oral)   Resp 17   SpO2 99%   Physical Exam  Constitutional: She is oriented to person, place, and time. She appears well-developed and well-nourished. No distress.  HENT:  Head: Normocephalic and atraumatic.  Nose:    Mouth/Throat: Oropharynx is clear and moist.  Eyes: Conjunctivae and EOM are normal.  Cardiovascular: Normal rate and regular rhythm.   Pulmonary/Chest: Effort normal and breath sounds normal. No stridor. No respiratory distress.  Abdominal: She exhibits no distension. There is no tenderness.  Musculoskeletal: She exhibits no edema.  Neurological: She is alert and oriented to person, place, and time. No cranial nerve deficit.  Skin: Skin is warm and dry.  Psychiatric: She has a normal mood and affect.  Nursing note and vitals reviewed.    ED Treatments / Results  Labs (all labs ordered are listed, but only abnormal results are displayed) Labs Reviewed  COMPREHENSIVE METABOLIC PANEL - Abnormal; Notable for the following:       Result Value   Glucose, Bld 111 (*)    Creatinine, Ser  1.25 (*)    Calcium 10.7 (*)    GFR calc non Af Amer 38 (*)    GFR calc Af Amer 44 (*)    All other components within normal limits  CBC - Abnormal; Notable for the following:    RBC 5.43 (*)    Hemoglobin 15.8 (*)    HCT 48.0 (*)    All other components within normal limits  URINALYSIS, ROUTINE W REFLEX MICROSCOPIC (NOT AT [facility])    Radiology No results found.  Procedures Procedures (including critical care time)  Medications Ordered in ED Medications - No data to display   Initial Impression / Assessment and Plan / ED Course  I have reviewed the triage vital signs and the nursing notes.  Pertinent labs & imaging results  that were available during my care of the patient were reviewed by me and considered in my medical decision making (see chart for details).  Clinical Course     Patient presents with concern of left nostril irritation. Patient does have some evidence for inflammation/early infection in the left nostril. Patient has recently been provided mupirocin, was encouraged to use this medication as directed. There is also some evidence for a polyp, but this requires additional evaluation with ENT. No other evidence for bacteremia, sepsis, labs generally unremarkable. Patient had INR checked within the past week, family defers additional evaluation of this. I encouraged her to follow-up with ENT, primary care.   Final Clinical Impressions(s) / ED Diagnoses   Final diagnoses:  Nostril infection  Nasal polyp     Carmin Muskrat, MD 11/26/16 1800

## 2016-11-26 NOTE — ED Notes (Signed)
MD may leave PT off monitor

## 2016-11-26 NOTE — ED Triage Notes (Signed)
Pt reports currently under treatment for possible sinus infection/MRSA infection. Reports recently having worms and eggs in her nasal passage? Now also has nausea and upset stomach. Denies vomiting, diarrhea or fever.

## 2016-11-26 NOTE — ED Notes (Signed)
Pt could not wait for repeat vitals  In a hurry to leave

## 2016-11-26 NOTE — ED Notes (Signed)
The pt and family are saying they dont need to talk to me they have already talking to the doctor.  They have been waiting since 1230 today to be seen.  The pt reports that she is not having any pain  Blanket given

## 2016-11-26 NOTE — ED Notes (Signed)
PT went to bathroom before UA was obtain.

## 2016-12-09 ENCOUNTER — Ambulatory Visit (INDEPENDENT_AMBULATORY_CARE_PROVIDER_SITE_OTHER): Payer: Medicare Other | Admitting: *Deleted

## 2016-12-09 DIAGNOSIS — I4891 Unspecified atrial fibrillation: Secondary | ICD-10-CM | POA: Diagnosis not present

## 2016-12-09 DIAGNOSIS — Z5181 Encounter for therapeutic drug level monitoring: Secondary | ICD-10-CM | POA: Diagnosis not present

## 2016-12-09 DIAGNOSIS — Z7901 Long term (current) use of anticoagulants: Secondary | ICD-10-CM | POA: Diagnosis not present

## 2016-12-09 LAB — POCT INR: INR: 2.4

## 2016-12-16 ENCOUNTER — Telehealth: Payer: Self-pay | Admitting: *Deleted

## 2016-12-16 NOTE — Telephone Encounter (Signed)
Spoke with pt and she states she is on Valacyclovir 500 mg bid for 5 days for fever blisters and also on Prevacid Patient instructed to take coumadin as ordered and take Valacyclovir as ordered that there is no interaction between these medications and to keep scheduled appt and to call if gets any new medications and pt states understanding

## 2016-12-19 ENCOUNTER — Telehealth: Payer: Self-pay | Admitting: Internal Medicine

## 2016-12-19 NOTE — Telephone Encounter (Signed)
New Message:   Pt having faster palpitations and does not feel good with them.She is not sure what causing it.

## 2016-12-19 NOTE — Telephone Encounter (Signed)
Spoke with pt, she woke at 3 am this morning with her heart beating fast, she did not check her blood pressure or heart rate. She reports she felt bad. Currently she feels batter and feels her heart rate has come down after taking her diltiazem. She reports she started 3 new medications on Tuesday this week. Prevacid, valacyc and eye drops. She reports she has noticed an increase in her heart rate in the mornings when getting up. Advised patient to try taking the metoprolol in the evening to help control her heart rate better in the evenings. Pt agreed with this plan. And will call with concerns or problems

## 2016-12-24 NOTE — Telephone Encounter (Signed)
No documentation 

## 2017-01-07 ENCOUNTER — Ambulatory Visit (INDEPENDENT_AMBULATORY_CARE_PROVIDER_SITE_OTHER): Payer: Medicare Other | Admitting: *Deleted

## 2017-01-07 ENCOUNTER — Other Ambulatory Visit: Payer: Self-pay | Admitting: Internal Medicine

## 2017-01-07 DIAGNOSIS — Z5181 Encounter for therapeutic drug level monitoring: Secondary | ICD-10-CM | POA: Diagnosis not present

## 2017-01-07 DIAGNOSIS — I4891 Unspecified atrial fibrillation: Secondary | ICD-10-CM

## 2017-01-07 DIAGNOSIS — Z7901 Long term (current) use of anticoagulants: Secondary | ICD-10-CM

## 2017-01-07 LAB — POCT INR: INR: 2.3

## 2017-02-03 ENCOUNTER — Ambulatory Visit (INDEPENDENT_AMBULATORY_CARE_PROVIDER_SITE_OTHER): Payer: Medicare Other

## 2017-02-03 DIAGNOSIS — Z7901 Long term (current) use of anticoagulants: Secondary | ICD-10-CM | POA: Diagnosis not present

## 2017-02-03 DIAGNOSIS — I4891 Unspecified atrial fibrillation: Secondary | ICD-10-CM | POA: Diagnosis not present

## 2017-02-03 DIAGNOSIS — Z5181 Encounter for therapeutic drug level monitoring: Secondary | ICD-10-CM

## 2017-02-03 LAB — POCT INR: INR: 2.1

## 2017-02-23 ENCOUNTER — Other Ambulatory Visit: Payer: Self-pay | Admitting: Family Medicine

## 2017-02-23 DIAGNOSIS — R590 Localized enlarged lymph nodes: Secondary | ICD-10-CM

## 2017-02-24 ENCOUNTER — Telehealth: Payer: Self-pay | Admitting: *Deleted

## 2017-02-24 NOTE — Telephone Encounter (Signed)
Pt called and states that she has had couple med changes Discontinued lorsartan and started Valsartan 160mg  daily and has started Pantoprazole 40 mg daily Instructed that these medications do not affect her coumadin She also states that she was ordered Prednisone but she did not get that medication states makes her feel bad. Instructed if she does start Prednisone to call and let us know and she states understanding

## 2017-03-02 ENCOUNTER — Ambulatory Visit
Admission: RE | Admit: 2017-03-02 | Discharge: 2017-03-02 | Disposition: A | Discharge status: Home / Self Care | Payer: Medicare Other | Source: Ambulatory Visit | Attending: Family Medicine | Admitting: Family Medicine

## 2017-03-02 ENCOUNTER — Other Ambulatory Visit (HOSPITAL_COMMUNITY): Payer: Medicare Other

## 2017-03-02 DIAGNOSIS — R590 Localized enlarged lymph nodes: Secondary | ICD-10-CM

## 2017-03-16 ENCOUNTER — Ambulatory Visit (INDEPENDENT_AMBULATORY_CARE_PROVIDER_SITE_OTHER): Payer: Medicare Other | Admitting: Internal Medicine

## 2017-03-16 ENCOUNTER — Other Ambulatory Visit: Payer: Self-pay

## 2017-03-16 ENCOUNTER — Encounter (INDEPENDENT_AMBULATORY_CARE_PROVIDER_SITE_OTHER): Payer: Self-pay

## 2017-03-16 ENCOUNTER — Ambulatory Visit (INDEPENDENT_AMBULATORY_CARE_PROVIDER_SITE_OTHER): Payer: Medicare Other | Admitting: Pharmacist

## 2017-03-16 ENCOUNTER — Ambulatory Visit (HOSPITAL_COMMUNITY): Payer: Medicare Other | Attending: Cardiovascular Disease

## 2017-03-16 ENCOUNTER — Encounter: Payer: Self-pay | Admitting: Internal Medicine

## 2017-03-16 VITALS — BP 140/90 | HR 75 | Ht 66.0 in | Wt 176.0 lb

## 2017-03-16 DIAGNOSIS — Z5181 Encounter for therapeutic drug level monitoring: Secondary | ICD-10-CM

## 2017-03-16 DIAGNOSIS — I4821 Permanent atrial fibrillation: Secondary | ICD-10-CM

## 2017-03-16 DIAGNOSIS — Z7901 Long term (current) use of anticoagulants: Secondary | ICD-10-CM

## 2017-03-16 DIAGNOSIS — I10 Essential (primary) hypertension: Secondary | ICD-10-CM | POA: Diagnosis not present

## 2017-03-16 DIAGNOSIS — I071 Rheumatic tricuspid insufficiency: Secondary | ICD-10-CM

## 2017-03-16 DIAGNOSIS — I4891 Unspecified atrial fibrillation: Secondary | ICD-10-CM

## 2017-03-16 DIAGNOSIS — I482 Chronic atrial fibrillation: Secondary | ICD-10-CM

## 2017-03-16 LAB — POCT INR: INR: 1.8

## 2017-03-16 NOTE — Patient Instructions (Signed)
Medication Instructions:  Your physician recommends that you continue on your current medications as directed. Please refer to the Current Medication list given to you today.  Labwork: None ordered.  Testing/Procedures: None ordered.  Follow-Up: Your physician wants you to follow-up in: 6 months with Renee Ursuy, PA. You will receive a reminder letter in the mail two months in advance. If you don't receive a letter, please call our office to schedule the follow-up appointment.   Any Other Special Instructions Will Be Listed Below (If Applicable).     If you need a refill on your cardiac medications before your next appointment, please call your pharmacy.  

## 2017-03-16 NOTE — Progress Notes (Signed)
PCP: Marylene Land, MD  The patient presents today for routine cardiology followup.  Since last being seen in our clinic, the patient reports doing reasonably well. Her exercise tolerance is stable.  She reports occasional L chest wall soreness which is unchanged.  She denies exertional symptoms.   Rare palpitations at night.  These were recently worse but have improved since switching her toprol to evening administration.   Past Medical History:  Diagnosis Date  . Adenomatous colon polyp 2005  . Anxiety age 43  . Carotid artery bruit 2001   left  . DDD (degenerative disc disease)   . DJD (degenerative joint disease)   . Gastroenteritis 2011  . GERD (gastroesophageal reflux disease) 2003  . History of pneumonia   . HTN (hypertension) 1983  . Hyperlipidemia 2000  . Hypothyroidism 2009  . Iron deficiency anemia 1983  . Osteoarthritis 2001  . Osteoporosis 2001  . Persistent atrial fibrillation ([place]) 2009  . Persistent headaches 1987  . Tricuspid regurgitation    severe by echo 02/25/12  . Vertigo   . Vitamin D deficiency 2011   Past Surgical History:  Procedure Laterality Date  . COLONOSCOPY W/ BIOPSIES AND POLYPECTOMY  09/16/2004   adenomatous polyp, diverticulosis, internal hemorrhoids  . s/p BLT    . TUBAL LIGATION      Current Outpatient Prescriptions  Medication Sig Dispense Refill  . Cholecalciferol (VITAMIN D3) 2000 units TABS Take 2,000 Units by mouth daily.    Marland Kitchen COUMADIN 2 MG tablet TAKE AS DIRECTED BY COUMADIN CLINIC 35 tablet 3  . diltiazem (DILACOR XR) 240 MG 24 hr capsule Take 240 mg by mouth daily.    Marland Kitchen levofloxacin (LEVAQUIN) 250 MG tablet Take 1 tablet by mouth every morning.  0  . LORazepam (ATIVAN) 1 MG tablet Take 1 mg by mouth 2 (two) times daily as needed (nervousness).     . metoprolol succinate (TOPROL-XL) 50 MG 24 hr tablet Take 1 tablet (50 mg total) by mouth daily. 90 tablet 3  . pantoprazole (PROTONIX) 40 MG tablet Take 40 mg by mouth daily as  needed (heartburn or acid reflux).     . SYNTHROID 50 MCG tablet Take 1 tablet by mouth daily OES  8  . triamcinolone cream (KENALOG) 0.1 % APPLY ONE APPLICATION TO RASH TWICE DAILY AS NEEDED. Use as directed  1  . triamterene-hydrochlorothiazide (MAXZIDE-25) 37.5-25 MG per tablet Take 0.5 tablets by mouth daily.     . valsartan (DIOVAN) 160 MG tablet Take 160 mg by mouth daily.    Marland Kitchen ZOCOR 80 MG tablet Take 0.25 tablets by mouth daily.  11  . Cyanocobalamin (VITAMIN B-12 PO) Take 1 tablet by mouth daily.     No current facility-administered medications for this visit.     Allergies  Allergen Reactions  . Meclizine Other (See Comments) and Palpitations    Tachycardia  . Pravastatin Other (See Comments)    Arthralgia, Myalgias  . Zetia [Ezetimibe] Other (See Comments)    Arthralgia, Myalgias  . Sulfamethoxazole Other (See Comments)    unknown  . Ace Inhibitors Cough  . Codeine Nausea Only  . Colestipol Hcl Other (See Comments)    Malaise  . Doxycycline Other (See Comments)    UNKNOWN  . Lovastatin Other (See Comments)    UNKNOWN  . Sulfa Antibiotics Other (See Comments)    unknown    Social History   Social History  . Marital status: Widowed    Spouse name: N/A  .  Number of children: 7  . Years of education: N/A   Occupational History  . Retired     [employer]   Social History Main Topics  . Smoking status: Never Smoker  . Smokeless tobacco: Never Used  . Alcohol use No  . Drug use: No  . Sexual activity: Not on file   Other Topics Concern  . Not on file   Social History Narrative  . No narrative on file    Family History  Problem Relation Age of Onset  . Hypertension Father   . Peripheral vascular disease Father   . Diabetes Mother   . Heart disease Brother 34  . Liver cancer Sister    Physical Exam: Vitals:   03/16/17 1330  BP: 140/90  Pulse: 75  SpO2: 99%  Weight: 176 lb (79.8 kg)  Height: 5\' 6"  (1.676 m)    GEN- The patient is well  appearing, alert and oriented x 3 today.   Head- normocephalic, atraumatic Eyes-  Sclera clear, conjunctiva pink Ears- hearing intact Oropharynx- clear Neck- supple, no JVP Lymph- no cervical lymphadenopathy Lungs- Clear to ausculation bilaterally, normal work of breathing Heart- irregular rate and rhythm GI- soft, NT, ND, + BS Extremities- no clubbing, cyanosis, or edema  Echo today reveals preserved EF and LVEDD, RV function stable but with volume overload, severe TR, severe RA enlargement Ekg today reveals afib, V rate  75 bpm, septal infarct, low voltage (personally reviewed)  Assessment and Plan: 1. Permanent afib Rate controlled Anticoagulated No changes at this time    2. Severe TR Remains symptom free Echo today is reviewed She wants to avoid surgery As long as EF is preserved and no RV failure, will follow supportively  3. HL Followed by Dr Sandi Mariscal  4. HTN She reports that she has not taken all of her medicine today Does not wish to make changes  Return every 6 months to see Jodene Nam MD, [facility] 03/16/2017 2:12 PM

## 2017-03-17 ENCOUNTER — Telehealth: Payer: Self-pay

## 2017-03-17 NOTE — Telephone Encounter (Signed)
Patient is aware and agreeable to results.

## 2017-03-31 ENCOUNTER — Other Ambulatory Visit: Payer: Self-pay | Admitting: Family Medicine

## 2017-03-31 ENCOUNTER — Telehealth: Payer: Self-pay | Admitting: Internal Medicine

## 2017-03-31 ENCOUNTER — Ambulatory Visit
Admission: RE | Admit: 2017-03-31 | Discharge: 2017-03-31 | Disposition: A | Discharge status: Home / Self Care | Payer: Medicare Other | Source: Ambulatory Visit | Attending: Family Medicine | Admitting: Family Medicine

## 2017-03-31 DIAGNOSIS — R600 Localized edema: Secondary | ICD-10-CM

## 2017-03-31 NOTE — Telephone Encounter (Signed)
New message    Judeen Hammans from Dr. Sandi Mariscal office is calling asking for call back from RN.

## 2017-03-31 NOTE — Telephone Encounter (Signed)
PER [facility] PT  HAS NEW  CHF  DX   DR  BLOMGREN WOULD LIKE PT  TO BE  SEEN  THIS  WEEK   TRANSFERRED  PHONE CALL TO SCHEDULERS  TO  MAKE  A  APPT .Lori Bridges

## 2017-04-03 ENCOUNTER — Ambulatory Visit (INDEPENDENT_AMBULATORY_CARE_PROVIDER_SITE_OTHER): Payer: Medicare Other | Admitting: Cardiology

## 2017-04-03 ENCOUNTER — Telehealth: Payer: Self-pay | Admitting: *Deleted

## 2017-04-03 ENCOUNTER — Encounter (INDEPENDENT_AMBULATORY_CARE_PROVIDER_SITE_OTHER): Payer: Self-pay

## 2017-04-03 ENCOUNTER — Encounter: Payer: Self-pay | Admitting: Cardiology

## 2017-04-03 VITALS — BP 136/80 | HR 90 | Ht 66.0 in | Wt 179.0 lb

## 2017-04-03 DIAGNOSIS — I5032 Chronic diastolic (congestive) heart failure: Secondary | ICD-10-CM

## 2017-04-03 DIAGNOSIS — I272 Pulmonary hypertension, unspecified: Secondary | ICD-10-CM | POA: Diagnosis not present

## 2017-04-03 DIAGNOSIS — R079 Chest pain, unspecified: Secondary | ICD-10-CM | POA: Diagnosis not present

## 2017-04-03 DIAGNOSIS — I361 Nonrheumatic tricuspid (valve) insufficiency: Secondary | ICD-10-CM | POA: Diagnosis not present

## 2017-04-03 DIAGNOSIS — I482 Chronic atrial fibrillation: Secondary | ICD-10-CM | POA: Diagnosis not present

## 2017-04-03 DIAGNOSIS — I4821 Permanent atrial fibrillation: Secondary | ICD-10-CM

## 2017-04-03 DIAGNOSIS — I10 Essential (primary) hypertension: Secondary | ICD-10-CM

## 2017-04-03 HISTORY — DX: Pulmonary hypertension, unspecified: I27.20

## 2017-04-03 MED ORDER — METOPROLOL SUCCINATE ER 100 MG PO TB24: 100.0000 mg | ORAL_TABLET | Freq: Every day | ORAL | 3 refills | Status: DC

## 2017-04-03 MED ORDER — FUROSEMIDE 40 MG PO TABS: 40.0000 mg | ORAL_TABLET | Freq: Every day | ORAL | 3 refills | Status: DC

## 2017-04-03 NOTE — Addendum Note (Signed)
Addended by: Harland German A on: 04/03/2017 11:48 AM   Modules accepted: Orders

## 2017-04-03 NOTE — Patient Instructions (Signed)
Medication Instructions:  1) START LASIX 40 mg daily. For the first 3 days, take 40 mg twice daily. 2) INCREASE TOPROL to 100 mg daily 3) STOP DILTIAZEM  Labwork: IN ONE WEEK: BMET, ANA, Sed rate, RF  Testing/Procedures: Your physician has recommended that you have a pulmonary function test. Pulmonary Function Tests are a group of tests that measure how well air moves in and out of your lungs.  Dr. Radford Pax recommends you have a VQ SCAN.  Your physician has requested that you have a lexiscan myoview. For further information please visit [email or web address]. Please follow instruction sheet, as given.   Your physician has recommended that you have a sleep study. This test records several body functions during sleep, including: brain activity, eye movement, oxygen and carbon dioxide blood levels, heart rate and rhythm, breathing rate and rhythm, the flow of air through your mouth and nose, snoring, body muscle movements, and chest and belly movement.  Follow-Up: Your physician recommends that you schedule a follow-up appointment in 2 WEEKS with Dr. Theodosia Blender assistant.  Your physician recommends that you schedule a follow-up appointment in 2 MONTHS with Dr. Radford Pax.  Any Other Special Instructions Will Be Listed Below (If Applicable).  Low-Sodium Eating Plan Sodium, which is an element that makes up salt, helps you maintain a healthy balance of fluids in your body. Too much sodium can increase your blood pressure and cause fluid and waste to be held in your body. Your health care provider or dietitian may recommend following this plan if you have high blood pressure (hypertension), kidney disease, liver disease, or heart failure. Eating less sodium can help lower your blood pressure, reduce swelling, and protect your heart, liver, and kidneys. What are tips for following this plan? General guidelines   Most people on this plan should limit their sodium intake to 1,500-2,000 mg (milligrams) of  sodium each day. Reading food labels   The Nutrition Facts label lists the amount of sodium in one serving of the food. If you eat more than one serving, you must multiply the listed amount of sodium by the number of servings.  Choose foods with less than 140 mg of sodium per serving.  Avoid foods with 300 mg of sodium or more per serving. Shopping   Look for lower-sodium products, often labeled as "low-sodium" or "no salt added."  Always check the sodium content even if foods are labeled as "unsalted" or "no salt added".  Buy fresh foods.  Avoid canned foods and premade or frozen meals.  Avoid canned, cured, or processed meats  Buy breads that have less than 80 mg of sodium per slice. Cooking   Eat more home-cooked food and less restaurant, buffet, and fast food.  Avoid adding salt when cooking. Use salt-free seasonings or herbs instead of table salt or sea salt. Check with your health care provider or pharmacist before using salt substitutes.  Cook with plant-based oils, such as canola, sunflower, or olive oil. Meal planning   When eating at a restaurant, ask that your food be prepared with less salt or no salt, if possible.  Avoid foods that contain MSG (monosodium glutamate). MSG is sometimes added to [place] food, bouillon, and some canned foods. What foods are recommended? The items listed may not be a complete list. Talk with your dietitian about what dietary choices are best for you. Grains  Low-sodium cereals, including oats, puffed wheat and rice, and shredded wheat. Low-sodium crackers. Unsalted rice. Unsalted pasta. Low-sodium bread. Whole-grain breads and whole-grain  pasta. Vegetables  Fresh or frozen vegetables. "No salt added" canned vegetables. "No salt added" tomato sauce and paste. Low-sodium or reduced-sodium tomato and vegetable juice. Fruits  Fresh, frozen, or canned fruit. Fruit juice. Meats and other protein foods  Fresh or frozen (no salt added) meat,  poultry, seafood, and fish. Low-sodium canned tuna and salmon. Unsalted nuts. Dried peas, beans, and lentils without added salt. Unsalted canned beans. Eggs. Unsalted nut butters. Dairy  Milk. Soy milk. Cheese that is naturally low in sodium, such as ricotta cheese, fresh mozzarella, or Swiss cheese Low-sodium or reduced-sodium cheese. Cream cheese. Yogurt. Fats and oils  Unsalted butter. Unsalted margarine with no trans fat. Vegetable oils such as canola or olive oils. Seasonings and other foods  Fresh and dried herbs and spices. Salt-free seasonings. Low-sodium mustard and ketchup. Sodium-free salad dressing. Sodium-free light mayonnaise. Fresh or refrigerated horseradish. Lemon juice. Vinegar. Homemade, reduced-sodium, or low-sodium soups. Unsalted popcorn and pretzels. Low-salt or salt-free chips. What foods are not recommended? The items listed may not be a complete list. Talk with your dietitian about what dietary choices are best for you. Grains  Instant hot cereals. Bread stuffing, pancake, and biscuit mixes. Croutons. Seasoned rice or pasta mixes. Noodle soup cups. Boxed or frozen macaroni and cheese. Regular salted crackers. Self-rising flour. Vegetables  Sauerkraut, pickled vegetables, and relishes. Olives. [place] fries. Onion rings. Regular canned vegetables (not low-sodium or reduced-sodium). Regular canned tomato sauce and paste (not low-sodium or reduced-sodium). Regular tomato and vegetable juice (not low-sodium or reduced-sodium). Frozen vegetables in sauces. Meats and other protein foods  Meat or fish that is salted, canned, smoked, spiced, or pickled. Bacon, ham, sausage, hotdogs, corned beef, chipped beef, packaged lunch meats, salt pork, jerky, pickled herring, anchovies, regular canned tuna, sardines, salted nuts. Dairy  Processed cheese and cheese spreads. Cheese curds. Blue cheese. Feta cheese. String cheese. Regular cottage cheese. Buttermilk. Canned milk. Fats and oils    Salted butter. Regular margarine. Ghee. Bacon fat. Seasonings and other foods  Onion salt, garlic salt, seasoned salt, table salt, and sea salt. Canned and packaged gravies. Worcestershire sauce. Tartar sauce. Barbecue sauce. Teriyaki sauce. Soy sauce, including reduced-sodium. Steak sauce. Fish sauce. Oyster sauce. Cocktail sauce. Horseradish that you find on the shelf. Regular ketchup and mustard. Meat flavorings and tenderizers. Bouillon cubes. Hot sauce and Tabasco sauce. Premade or packaged marinades. Premade or packaged taco seasonings. Relishes. Regular salad dressings. Salsa. Potato and tortilla chips. Corn chips and puffs. Salted popcorn and pretzels. Canned or dried soups. Pizza. Frozen entrees and pot pies. Summary  Eating less sodium can help lower your blood pressure, reduce swelling, and protect your heart, liver, and kidneys.  Most people on this plan should limit their sodium intake to 1,500-2,000 mg (milligrams) of sodium each day.  Canned, boxed, and frozen foods are high in sodium. Restaurant foods, fast foods, and pizza are also very high in sodium. You also get sodium by adding salt to food.  Try to cook at home, eat more fresh fruits and vegetables, and eat less fast food, canned, processed, or prepared foods. This information is not intended to replace advice given to you by your health care provider. Make sure you discuss any questions you have with your health care provider. Document Released: 06/06/2002 Document Revised: 12/08/2016 Document Reviewed: 12/08/2016 Elsevier Interactive Patient Education  2017 [employer].

## 2017-04-03 NOTE — Progress Notes (Signed)
Cardiology Office Note    Date:  04/03/2017   ID:  [name], DOB 1930/10/06, MRN [id number]  PCP:  Marylene Land, MD  Cardiologist:  Fransico Him, MD   Chief Complaint  Patient presents with  . New Evaluation    LE edema    History of Present Illness:  Lori Bridges is a 83 y.o. female with a history of GERD, HTN, hyperlipidemia, permanent atrial fibrillation on chronic coumadin followed by Dr. Rayann Heman who is referred by her PCP, Dr. Sandi Mariscal, for new onset CHF.  She has a history of severe TR by echo with RV volume overload with mild RVE and moderate pulmonary HTN with PASP 10mmHg. This dates back to 2013 by echo.  She apparently saw her PCP recently and was noted to have LE edema and a BNP was elevated at 481. The swelling has been going on for about 2 weeks.  She denies any SOB or DOE during the day but at night she gets mild SOB.  She sleeps on 3-4 pillows at night but this is for comfort and not due to SOB.  She has been having some pain in her left chest intermittent which is hard for her to describe but it goes away when she rubs it.  There is radiation of the pain in to her axilla but no associated symptoms of nausea or diaphoresis.  She denies any syncope but occasionally has problems with vertigo.  She denies any table salt but eats a lot of canned food.  She eats a lot of peanut butter. She denies snoring at night but wakes up frequently at night.  She sleeps during the day some.      Past Medical History:  Diagnosis Date  . Adenomatous colon polyp 2005  . Anxiety age 75  . Carotid artery bruit 2001   left  . DDD (degenerative disc disease)   . DJD (degenerative joint disease)   . Gastroenteritis 2011  . GERD (gastroesophageal reflux disease) 2003  . History of pneumonia   . HTN (hypertension) 1983  . Hyperlipidemia 2000  . Hypothyroidism 2009  . Iron deficiency anemia 1983  . Osteoarthritis 2001  . Osteoporosis 2001  . Persistent atrial fibrillation ([place])  2009  . Persistent atrial fibrillation ([place]) 07/23/2010       . Persistent headaches 1987  . Pulmonary HTN 04/03/2017   Moderate with PASP 28mmHg by echo 02/2017  . Tricuspid regurgitation    severe by echo 02/25/12  . Vertigo   . Vitamin D deficiency 2011    Past Surgical History:  Procedure Laterality Date  . COLONOSCOPY W/ BIOPSIES AND POLYPECTOMY  09/16/2004   adenomatous polyp, diverticulosis, internal hemorrhoids  . s/p BLT    . TUBAL LIGATION      Current Medications: Current Meds  Medication Sig  . Cholecalciferol (VITAMIN D3) 2000 units TABS Take 2,000 Units by mouth daily.  Marland Kitchen COUMADIN 2 MG tablet TAKE AS DIRECTED BY COUMADIN CLINIC  . Cyanocobalamin (VITAMIN B-12 PO) Take 1 tablet by mouth daily.  Marland Kitchen diltiazem (DILACOR XR) 240 MG 24 hr capsule Take 240 mg by mouth daily.  Marland Kitchen LORazepam (ATIVAN) 1 MG tablet Take 1 mg by mouth 2 (two) times daily as needed (nervousness).   . metoprolol succinate (TOPROL-XL) 50 MG 24 hr tablet Take 1 tablet (50 mg total) by mouth daily.  . pantoprazole (PROTONIX) 40 MG tablet Take 40 mg by mouth daily as needed (heartburn or acid reflux).   Marland Kitchen  SYNTHROID 50 MCG tablet Take 1 tablet by mouth daily OES  . triamcinolone cream (KENALOG) 0.1 % APPLY ONE APPLICATION TO RASH TWICE DAILY AS NEEDED. Use as directed  . valsartan (DIOVAN) 160 MG tablet Take 160 mg by mouth daily.  Marland Kitchen ZOCOR 80 MG tablet Take 0.25 tablets by mouth daily.    Allergies:   Meclizine; Pravastatin; Zetia [ezetimibe]; Sulfamethoxazole; Ace inhibitors; Codeine; Colestipol hcl; Doxycycline; Lovastatin; and Sulfa antibiotics   Social History   Social History  . Marital status: Widowed    Spouse name: N/A  . Number of children: 7  . Years of education: N/A   Occupational History  . Retired     [employer]   Social History Main Topics  . Smoking status: Never Smoker  . Smokeless tobacco: Never Used  . Alcohol use No  . Drug use: No  . Sexual activity: Not on file   Other  Topics Concern  . Not on file   Social History Narrative  . No narrative on file     Family History:  The patient's family history includes Diabetes in her mother; Heart disease (age of onset: 79) in her brother; Hypertension in her father; Liver cancer in her sister; Peripheral vascular disease in her father.   ROS:   Please see the history of present illness.    ROS All other systems reviewed and are negative.  No flowsheet data found.     PHYSICAL EXAM:   VS:  BP 136/80   Pulse 90   Ht 5\' 6"  (1.676 m)   Wt 179 lb (81.2 kg)   SpO2 96%   BMI 28.89 kg/m    GEN: Well nourished, well developed, in no acute distress  HEENT: normal  Neck: no JVD, carotid bruits, or masses Cardiac: RRR; no murmurs, rubs, or gallops,no edema.  Intact distal pulses bilaterally.  Respiratory:  clear to auscultation bilaterally, normal work of breathing GI: soft, nontender, nondistended, + BS MS: no deformity or atrophy  Skin: warm and dry, no rash Neuro:  Alert and Oriented x 3, Strength and sensation are intact Psych: euthymic mood, full affect  Wt Readings from Last 3 Encounters:  04/03/17 179 lb (81.2 kg)  03/16/17 176 lb (79.8 kg)  09/24/16 181 lb 9.6 oz (82.4 kg)      Studies/Labs Reviewed:   EKG:  EKG is not ordered today.  The ekg ordered today demonstrates   Recent Labs: 11/26/2016: ALT 17; BUN 11; Creatinine, Ser 1.25; Hemoglobin 15.8; Platelets 203; Potassium 4.2; Sodium 140   Lipid Panel No results found for: CHOL, TRIG, HDL, CHOLHDL, VLDL, LDLCALC, LDLDIRECT  Additional studies/ records that were reviewed today include:  Office notes    ASSESSMENT:    1. Chronic diastolic CHF (congestive heart failure) ([place])   2. HYPERTENSION, MILD   3. Permanent atrial fibrillation ([place])   4. Non-rheumatic tricuspid valve insufficiency   5. Pulmonary HTN   6. Chest pain, unspecified type      PLAN:  In order of problems listed above:  1. LE edema- she has had some problems  with LE edema and recent BNP was elevated at 481.  She is currently not on a diuretic. I suspect that this is multifactorial from dietary indiscretion with sodium, CCB and right sided HF.  I will start her on Lasix 40mg  BID for 3 days and then 40mg  daily.  I will check a BMET in 1 week.  I am going to stop her Cardizem and increase  Toprol to 100mg  daily.    2. HTN - her BP is controlled on exam today.  She will continue on ARB and BB.  I will adjust BB as we are stopping her CCB.  3. Permanent atrial fibrillation on chronic anticoagulation with warfarin followed by Dr. Rayann Heman. I will increase Toprol to 100mg  daily since I am stopping her Cardizem.   4. Severe TR by echo - this has been noted as far back as 2013.  She has not wanted surgical repair.    5. Moderate pulmonary HTN with dilated RV and RV volume overload - likely Group 2 related to pulmonary venous HTN but need to rule out other etiologies.  I will check a sed rate, ANA and RF.  I will get PFTs with DLCO to rule out pulmonary disease.  She wakes up frequently at night and sleeps during the day so need to rule out OSA.  I will get a sleep study.  If workup is normal then will refer th Dr. Aundra Dubin for consideration of [place].  6.   Chest pain - she is very vague in description but it occurs daily.  Her brother had an MI at age 65.  Her CRFs also include HTN, dyslipidemia.  I will get a Lexiscan myoview to rule out ischemia.      Medication Adjustments/Labs and Tests Ordered: Current medicines are reviewed at length with the patient today.  Concerns regarding medicines are outlined above.  Medication changes, Labs and Tests ordered today are listed in the Patient Instructions below.  There are no Patient Instructions on file for this visit.   Signed, Fransico Him, MD  04/03/2017 10:32 AM    [facility] [address] [place] [address], [place], [place]  [address] Phone: [phone]; Fax: [phone]

## 2017-04-03 NOTE — Telephone Encounter (Signed)
Pt called stating had been placed on Furosemide due to edema and was concerned that would affect her Coumadin Instructed that there is no interaction between lasix and Coumadin and she states understanding

## 2017-04-06 ENCOUNTER — Telehealth: Payer: Self-pay | Admitting: *Deleted

## 2017-04-06 NOTE — Telephone Encounter (Signed)
Patient notified of upcoming sleep study and has agreed to the study

## 2017-04-06 NOTE — Telephone Encounter (Signed)
Patients Epworth Sleepiness Score is 6

## 2017-04-07 ENCOUNTER — Telehealth (HOSPITAL_COMMUNITY): Payer: Self-pay | Admitting: *Deleted

## 2017-04-07 ENCOUNTER — Telehealth: Payer: Self-pay | Admitting: Cardiology

## 2017-04-07 NOTE — Telephone Encounter (Signed)
Informed patient that her chest xray and other test is schedule at the hospital tomorrow. Then patient's Lori Bridges is schedule the next day at our office at [address]. Patient verbalized understanding.

## 2017-04-07 NOTE — Telephone Encounter (Signed)
Patient given detailed instructions per Myocardial Perfusion Study Information Sheet for the test on 04/09/17 Patient notified to arrive 15 minutes early and that it is imperative to arrive on time for appointment to keep from having the test rescheduled.  If you need to cancel or reschedule your appointment, please call the office within 24 hours of your appointment. Failure to do so may result in a cancellation of your appointment, and a $50 no show fee. Patient verbalized understanding. Uyen Eichholz, Orphia Jacqueline    

## 2017-04-07 NOTE — Telephone Encounter (Signed)
New message      Pt was recently seen by Dr Radford Pax.  She is confused regarding her upcoming test.  She is scheduled to have a test at [facility] on tomorrow (April 11) and she is also scheduled to have a nuclear stress test at our office on thurs (April 12).  She was told that she could have both test in our office on thurs.  Please call and clarify test.

## 2017-04-08 ENCOUNTER — Ambulatory Visit (HOSPITAL_COMMUNITY)
Admission: RE | Admit: 2017-04-08 | Discharge: 2017-04-08 | Disposition: A | Discharge status: Home / Self Care | Payer: Medicare Other | Source: Ambulatory Visit | Attending: Cardiology | Admitting: Cardiology

## 2017-04-08 DIAGNOSIS — I517 Cardiomegaly: Secondary | ICD-10-CM | POA: Insufficient documentation

## 2017-04-08 DIAGNOSIS — I272 Pulmonary hypertension, unspecified: Secondary | ICD-10-CM | POA: Diagnosis present

## 2017-04-08 DIAGNOSIS — R079 Chest pain, unspecified: Secondary | ICD-10-CM | POA: Diagnosis present

## 2017-04-08 DIAGNOSIS — I700 Atherosclerosis of aorta: Secondary | ICD-10-CM | POA: Diagnosis not present

## 2017-04-08 MED ORDER — TECHNETIUM TO 99M ALBUMIN AGGREGATED
4.2500 | Freq: Once | INTRAVENOUS | Status: AC | PRN
  Administered 2017-04-08: 4.25 via INTRAVENOUS

## 2017-04-08 MED ORDER — TECHNETIUM TC 99M DIETHYLENETRIAME-PENTAACETIC ACID
32.0000 | Freq: Once | INTRAVENOUS | Status: DC | PRN
Start: 2017-04-08 — End: 2017-04-14

## 2017-04-09 ENCOUNTER — Other Ambulatory Visit: Payer: Medicare Other | Admitting: *Deleted

## 2017-04-09 ENCOUNTER — Ambulatory Visit (HOSPITAL_COMMUNITY): Payer: Medicare Other

## 2017-04-09 DIAGNOSIS — I272 Pulmonary hypertension, unspecified: Secondary | ICD-10-CM

## 2017-04-09 DIAGNOSIS — R079 Chest pain, unspecified: Secondary | ICD-10-CM

## 2017-04-09 DIAGNOSIS — I5032 Chronic diastolic (congestive) heart failure: Secondary | ICD-10-CM

## 2017-04-10 LAB — BASIC METABOLIC PANEL
BUN/Creatinine Ratio: 22 (ref 12–28)
BUN: 26 mg/dL (ref 8–27)
CO2: 29 mmol/L (ref 18–29)
Calcium: 10.8 mg/dL — ABNORMAL HIGH (ref 8.7–10.3)
Chloride: 98 mmol/L (ref 96–106)
Creatinine, Ser: 1.19 mg/dL — ABNORMAL HIGH (ref 0.57–1.00)
GFR, EST AFRICAN AMERICAN: 48 mL/min/{1.73_m2} — AB (ref >59)
GFR, EST NON AFRICAN AMERICAN: 42 mL/min/{1.73_m2} — AB (ref >59)
Glucose: 99 mg/dL (ref 65–99)
POTASSIUM: 4.8 mmol/L (ref 3.5–5.2)
SODIUM: 142 mmol/L (ref 134–144)

## 2017-04-10 LAB — SEDIMENTATION RATE: SED RATE: 2 mm/h (ref 0–40)

## 2017-04-10 LAB — ANA: Anti Nuclear Antibody(ANA): NEGATIVE

## 2017-04-10 LAB — RHEUMATOID FACTOR: Rhuematoid fact SerPl-aCnc: <10 IU/mL (ref 0.0–13.9)

## 2017-04-14 ENCOUNTER — Ambulatory Visit (INDEPENDENT_AMBULATORY_CARE_PROVIDER_SITE_OTHER): Payer: Medicare Other | Admitting: *Deleted

## 2017-04-14 ENCOUNTER — Ambulatory Visit (HOSPITAL_COMMUNITY): Payer: Medicare Other | Attending: Cardiovascular Disease

## 2017-04-14 VITALS — Ht 66.0 in | Wt 179.0 lb

## 2017-04-14 DIAGNOSIS — Z7901 Long term (current) use of anticoagulants: Secondary | ICD-10-CM | POA: Diagnosis not present

## 2017-04-14 DIAGNOSIS — Z5181 Encounter for therapeutic drug level monitoring: Secondary | ICD-10-CM

## 2017-04-14 DIAGNOSIS — I4891 Unspecified atrial fibrillation: Secondary | ICD-10-CM | POA: Diagnosis not present

## 2017-04-14 DIAGNOSIS — I4821 Permanent atrial fibrillation: Secondary | ICD-10-CM

## 2017-04-14 DIAGNOSIS — R079 Chest pain, unspecified: Secondary | ICD-10-CM | POA: Diagnosis present

## 2017-04-14 DIAGNOSIS — I482 Chronic atrial fibrillation: Secondary | ICD-10-CM

## 2017-04-14 LAB — MYOCARDIAL PERFUSION IMAGING
NUC STRESS TID: 0.9
Peak HR: 121 {beats}/min
RATE: 0.29
Rest HR: 88 {beats}/min
SDS: 2
SRS: 2
SSS: 4

## 2017-04-14 LAB — POCT INR: INR: 2.7

## 2017-04-14 MED ORDER — AMINOPHYLLINE 25 MG/ML IV SOLN
75.0000 mg | Freq: Once | INTRAVENOUS | Status: AC
  Administered 2017-04-14: 75 mg via INTRAVENOUS

## 2017-04-14 MED ORDER — TECHNETIUM TC 99M TETROFOSMIN IV KIT
11.0000 | PACK | Freq: Once | INTRAVENOUS | Status: AC | PRN
  Administered 2017-04-14: 11 via INTRAVENOUS
  Filled 2017-04-14: qty 11

## 2017-04-14 MED ORDER — REGADENOSON 0.4 MG/5ML IV SOLN
0.4000 mg | Freq: Once | INTRAVENOUS | Status: AC
  Administered 2017-04-14: 0.4 mg via INTRAVENOUS

## 2017-04-14 MED ORDER — TECHNETIUM TC 99M TETROFOSMIN IV KIT
33.0000 | PACK | Freq: Once | INTRAVENOUS | Status: AC | PRN
Start: 2017-04-14 — End: 2017-04-14
  Administered 2017-04-14: 33 via INTRAVENOUS
  Filled 2017-04-14: qty 33

## 2017-04-22 ENCOUNTER — Ambulatory Visit (INDEPENDENT_AMBULATORY_CARE_PROVIDER_SITE_OTHER): Payer: Medicare Other | Admitting: Cardiology

## 2017-04-22 ENCOUNTER — Encounter: Payer: Self-pay | Admitting: Cardiology

## 2017-04-22 VITALS — BP 122/84 | HR 84 | Ht 66.0 in | Wt 163.0 lb

## 2017-04-22 DIAGNOSIS — I5032 Chronic diastolic (congestive) heart failure: Secondary | ICD-10-CM

## 2017-04-22 NOTE — Patient Instructions (Signed)
Your physician recommends that you continue on your current medications as directed. Please refer to the Current Medication list given to you today.     

## 2017-04-22 NOTE — Progress Notes (Signed)
04/22/2017 Lori Bridges   1930/11/18  [id number]  Primary Physician Marylene Land, MD Primary Cardiologist: Dr. Radford Pax  Electrophysiologist: Dr. Rayann Heman   Reason for Visit/CC: F/u for Diastolic HF and pulmonary HTN  HPI:  Lori Bridges is a 83 y.o. female who is being seen today for follow-up for diastolic heart failure and pulmonary HTN. She also has a history of GERD, hypertension, hyperlipidemia, permanent atrial fibrillation on chronic Coumadin which is followed by Dr. Rayann Heman as well as known diastolic dysfunction and pulmonary hypertension. Recently referred to Dr. Radford Pax by her PCP for new onset CHF. She had noted bilateral lower extremity edema and was found to have a BNP of 481. She also endorsed 3-4 pillow orthopnea. This was also in the setting of recent dietary indiscretion with sodium (patient eats a lot of canned foods and peanut butter). 2-D echocardiogram 03/16/2017 revealed normal left ventricular systolic function with an estimated ejection fraction of 55-60% with normal wall motion. Pulmonary artery pressure was mildly to moderately increased at 46 mmHg.  Per Dr. Theodosia Blender assessment, she felt that her LEE was likely multifactorial from dietary indiscretion with sodium, diastolic dysfunction, calcium channel blocker as well as right sided heart failure. She elected to start her on diuretic therapy with Lasix, initially at a dose of 40 mg twice a day for 3 days followed by 40 mg daily. She had a repeat BMP one week later which showed stable renal function and K level. Dr. Radford Pax also stopped her Cardizem and elected to increase her Toprol-XL to 100 mg daily. In addition, she ordered a nuclear stress test to rule out ischemia given atypical chest pain and multiple cardiac risk factors. This was a low risk study. The patient denies any recurrent chest discomfort. Dr. Radford Pax also ordered a number of studies to help determine an etiology for her pulmonary hypertension. She checked a  sed rate, ANA and RF. All were unremarkable. She has also been ordered to get pulmonary function tests as well as a sleep study to rule out sleep apnea. The these studies have not been performed yet but are scheduled. The patient is aware of these dates. Per Dr. Radford Pax, if her complete workup is normal she will be referred to Dr. Aundra Dubin for consideration for right heart cath.  Lori Bridges presents to clinic today to reassess her response to Lasix as well as her medication adjustment with the increase in Metroprolol. She has done well. She reports that her LEE has significantly improved. She has tolerated these medications well. Blood pressure is well controlled at 122/84. Pulse rate is well controlled in the mid 80s. She denies any intolerances. She has cut back on her sodium consumption.   Current Meds  Medication Sig  . Cholecalciferol (VITAMIN D3) 2000 units TABS Take 2,000 Units by mouth daily.  Marland Kitchen COUMADIN 2 MG tablet TAKE AS DIRECTED BY COUMADIN CLINIC  . Cyanocobalamin (VITAMIN B-12 PO) Take 1 tablet by mouth daily.  . furosemide (LASIX) 40 MG tablet Take 1 tablet (40 mg total) by mouth daily.  Marland Kitchen LORazepam (ATIVAN) 1 MG tablet Take 1 mg by mouth 2 (two) times daily as needed (nervousness).   . metoprolol succinate (TOPROL-XL) 100 MG 24 hr tablet Take 1 tablet (100 mg total) by mouth daily.  . pantoprazole (PROTONIX) 40 MG tablet Take 40 mg by mouth daily as needed (heartburn or acid reflux).   . SYNTHROID 50 MCG tablet Take 1 tablet by mouth daily OES  . triamcinolone cream (KENALOG)  0.1 % APPLY ONE APPLICATION TO RASH TWICE DAILY AS NEEDED. Use as directed  . valsartan (DIOVAN) 160 MG tablet Take 160 mg by mouth daily.  Marland Kitchen ZOCOR 80 MG tablet Take 0.25 tablets by mouth daily.   Allergies  Allergen Reactions  . Meclizine Other (See Comments) and Palpitations    Tachycardia  . Pravastatin Other (See Comments)    Arthralgia, Myalgias  . Zetia [Ezetimibe] Other (See Comments)     Arthralgia, Myalgias  . Sulfamethoxazole Other (See Comments)    unknown  . Ace Inhibitors Cough  . Codeine Nausea Only  . Colestipol Hcl Other (See Comments)    Malaise  . Doxycycline Other (See Comments)    UNKNOWN  . Lovastatin Other (See Comments)    UNKNOWN  . Sulfa Antibiotics Other (See Comments)    unknown   Past Medical History:  Diagnosis Date  . Adenomatous colon polyp 2005  . Anxiety age 74  . Carotid artery bruit 2001   left  . DDD (degenerative disc disease)   . DJD (degenerative joint disease)   . Gastroenteritis 2011  . GERD (gastroesophageal reflux disease) 2003  . History of pneumonia   . HTN (hypertension) 1983  . Hyperlipidemia 2000  . Hypothyroidism 2009  . Iron deficiency anemia 1983  . Osteoarthritis 2001  . Osteoporosis 2001  . Persistent atrial fibrillation ([place]) 2009  . Persistent atrial fibrillation ([place]) 07/23/2010       . Persistent headaches 1987  . Pulmonary HTN ([place]) 04/03/2017   Moderate with PASP 40mmHg by echo 02/2017  . Tricuspid regurgitation    severe by echo 02/25/12  . Vertigo   . Vitamin D deficiency 2011   Family History  Problem Relation Age of Onset  . Hypertension Father   . Peripheral vascular disease Father   . Diabetes Mother   . Heart disease Brother 65  . Liver cancer Sister    Past Surgical History:  Procedure Laterality Date  . COLONOSCOPY W/ BIOPSIES AND POLYPECTOMY  09/16/2004   adenomatous polyp, diverticulosis, internal hemorrhoids  . s/p BLT    . TUBAL LIGATION     Social History   Social History  . Marital status: Widowed    Spouse name: N/A  . Number of children: 7  . Years of education: N/A   Occupational History  . Retired     [employer]   Social History Main Topics  . Smoking status: Never Smoker  . Smokeless tobacco: Never Used  . Alcohol use No  . Drug use: No  . Sexual activity: Not on file   Other Topics Concern  . Not on file   Social History Narrative  . No narrative on file       Review of Systems: General: negative for chills, fever, night sweats or weight changes.  Cardiovascular: negative for chest pain, dyspnea on exertion, edema, orthopnea, palpitations, paroxysmal nocturnal dyspnea or shortness of breath Dermatological: negative for rash Respiratory: negative for cough or wheezing Urologic: negative for hematuria Abdominal: negative for nausea, vomiting, diarrhea, bright red blood per rectum, melena, or hematemesis Neurologic: negative for visual changes, syncope, or dizziness All other systems reviewed and are otherwise negative except as noted above.   Physical Exam:  Blood pressure 122/84, pulse 84, height 5\' 6"  (1.676 m), weight 163 lb (73.9 kg), SpO2 98 %.  General appearance: alert, cooperative and no distress Neck: no carotid bruit and no JVD Lungs: clear to auscultation bilaterally Heart: irregular, irregular rhythm,  Regular  rate, S1, S2 normal, no murmur, click, rub or gallop Extremities: extremities normal, atraumatic, no cyanosis or edema Pulses: 2+ and symmetric Skin: Skin color, texture, turgor normal. No rashes or lesions Neurologic: Grossly normal  EKG not performed -- personally reviewed   ASSESSMENT AND PLAN:   1. Diastolic HF: recent echo 06/6719 with normal LVEF. Now euvolemic on exam. LEE resolved. No dyspnea. Tolerating lasix well. BP stable. F/u BMP showed stable renal function and K. She has reduced sodium consumption. Continue lasix, BB and low sodium diet.   2. Chest Pain: atypical CP noted during office visit with Dr. Radford Pax. NST 04/08/17 low risk. Normal EF. Pt denies further symptoms.   3. Chronic Atrial Fibrillation: rate is controlled with BB. HR in the 80s. She is on coumadin for a/c. Reports full compliance. No abnormal bleeding.  4. Pulmonary HTN: recent echo showed mild-moderate Pulomonary HTN. PA pressure 46 mgHg. ? Etiology. Sed Rate, ANA and RF negative. PFTs and Sleep study pending. If normal work up, plan to  to refer to Dr. Aundra Dubin for consideration for RHC.   5. Tricuspid Regurgitation: severe by echo 02/2017.   PLAN: continue current plan of care.  No changes made today. Sleep study and PFTs are arranged. Keep f/u with Dr. Radford Pax 06/18/17.    Brooks Stotz Lori Bridges, MHS [facility] 04/22/2017 8:55 AM

## 2017-05-11 ENCOUNTER — Telehealth: Payer: Self-pay | Admitting: Cardiology

## 2017-05-11 NOTE — Telephone Encounter (Signed)
New message    Pt daughter is calling asking to be called back about sleep apnea test. She says she feels like pt does not need this test.

## 2017-05-11 NOTE — Telephone Encounter (Signed)
Patients daughter Mauri [place]) called today to say her mother does not need a sleep study test.. She states her mom wakes up in the middle of the night to count her pills so she does not forget to take them not because she cant' sleep. She took her mom to see her PCP today Derinda Late) and she states" he said her mom did not need a sleep test because she has been waking up in the middle of the night for many years". She states she was calling to help our office understand why her mother does not need a sleep apnea test. The patient stated to Gae Bon (cpap assistant) that she does not want a sleep study test done when I returned her call today. Message routed to Dr Radford Pax for advice

## 2017-05-14 NOTE — Telephone Encounter (Signed)
Spoke with Lori Bridges in great detail about the need for sleep study due to PHTN. She understands that if the patient has OSA, leaving it untreated could cause right sided heart failure in the future. She states her mom is extremely anxious and although she understands why Dr. Radford Pax would like the test completed, she would like to discuss with Dr. Radford Pax at the office visit 6/21 and cancel PSG for now.  Per her request, cancelled study. They will discuss sleep study at [place] with Dr. Radford Pax and schedule at that time if they feel necessary. She was grateful for call.

## 2017-05-14 NOTE — Telephone Encounter (Signed)
-----   Message from Sueanne Margarita, MD sent at 05/12/2017  3:39 PM EDT ----- Please let patient's daughter know that sleep study is not because she is waking up at night - it is because she has pulmonary HTN and if she has OSA and it is not treated she could end up with right sided heart failure down the road  Traci ----- Message ----- From: Freada Bergeron, CMA Sent: 05/11/2017   3:56 PM To: Sueanne Margarita, MD, Freada Bergeron, CMA  Patients daughter Mauri Brooklyn) called today to say her mother does not need a sleep study test.. She states her mom wakes up in the middle of the night to count her pills so she does not forget to take them not because she cant' sleep. She took her mom to see her PCP today Derinda Late) and she states" he said her mom did not need a sleep test  because she has been waking up in the middle of the night for many years". She states she was calling to help our office understand why her mother does not need a sleep apnea test. The patient stated to Gae Bon (cpap assistant) that she does not want a sleep study test done when I returned her call today. Message routed to Dr Radford Pax for advice

## 2017-05-14 NOTE — Telephone Encounter (Signed)
Follow up   What is the next step with her mother?  She has been waiting for a response on the next step in the process

## 2017-05-26 ENCOUNTER — Ambulatory Visit (INDEPENDENT_AMBULATORY_CARE_PROVIDER_SITE_OTHER): Payer: Medicare Other | Admitting: *Deleted

## 2017-05-26 DIAGNOSIS — I4891 Unspecified atrial fibrillation: Secondary | ICD-10-CM | POA: Diagnosis not present

## 2017-05-26 DIAGNOSIS — I4821 Permanent atrial fibrillation: Secondary | ICD-10-CM

## 2017-05-26 DIAGNOSIS — I482 Chronic atrial fibrillation: Secondary | ICD-10-CM | POA: Diagnosis not present

## 2017-05-26 DIAGNOSIS — Z7901 Long term (current) use of anticoagulants: Secondary | ICD-10-CM | POA: Diagnosis not present

## 2017-05-26 DIAGNOSIS — Z5181 Encounter for therapeutic drug level monitoring: Secondary | ICD-10-CM | POA: Diagnosis not present

## 2017-05-26 LAB — POCT INR: INR: 2.5

## 2017-05-28 ENCOUNTER — Telehealth: Payer: Self-pay | Admitting: Cardiology

## 2017-05-28 NOTE — Telephone Encounter (Signed)
Lori Bridges is calling because she want to why she needs to have a PFT . Please call -+

## 2017-05-28 NOTE — Telephone Encounter (Signed)
Informed patient she needs to have PFTs to evaluate the elevated pressures between her heart and lungs. Confirmed with patient her sleep study was cancelled per her daughter's request - they will discuss this with Dr. Radford Pax 6/21. Patient was grateful for call.

## 2017-05-29 ENCOUNTER — Encounter (HOSPITAL_BASED_OUTPATIENT_CLINIC_OR_DEPARTMENT_OTHER): Payer: Medicare Other

## 2017-05-30 ENCOUNTER — Other Ambulatory Visit: Payer: Self-pay | Admitting: Internal Medicine

## 2017-06-01 ENCOUNTER — Ambulatory Visit (INDEPENDENT_AMBULATORY_CARE_PROVIDER_SITE_OTHER): Payer: Medicare Other | Admitting: Internal Medicine

## 2017-06-01 DIAGNOSIS — I272 Pulmonary hypertension, unspecified: Secondary | ICD-10-CM

## 2017-06-01 LAB — PULMONARY FUNCTION TEST
FEF 25-75 PRE: 1.17 L/s
FEF2575-%PRED-PRE: 92 %
FEV1-%Pred-Pre: 76 %
FEV1-PRE: 1.22 L
FEV1FVC-%PRED-PRE: 107 %
FEV6-%Pred-Pre: 77 %
FEV6-Pre: 1.53 L
FEV6FVC-%PRED-PRE: 104 %
FVC-%Pred-Pre: 74 %
FVC-Pre: 1.53 L
Pre FEV1/FVC ratio: 80 %
Pre FEV6/FVC Ratio: 100 %

## 2017-06-18 ENCOUNTER — Ambulatory Visit (INDEPENDENT_AMBULATORY_CARE_PROVIDER_SITE_OTHER): Payer: Medicare Other | Admitting: Cardiology

## 2017-06-18 ENCOUNTER — Encounter: Payer: Self-pay | Admitting: Cardiology

## 2017-06-18 VITALS — BP 120/84 | HR 85 | Ht 66.0 in | Wt 162.0 lb

## 2017-06-18 DIAGNOSIS — I482 Chronic atrial fibrillation: Secondary | ICD-10-CM

## 2017-06-18 DIAGNOSIS — R079 Chest pain, unspecified: Secondary | ICD-10-CM

## 2017-06-18 DIAGNOSIS — I272 Pulmonary hypertension, unspecified: Secondary | ICD-10-CM

## 2017-06-18 DIAGNOSIS — I5032 Chronic diastolic (congestive) heart failure: Secondary | ICD-10-CM

## 2017-06-18 DIAGNOSIS — I361 Nonrheumatic tricuspid (valve) insufficiency: Secondary | ICD-10-CM | POA: Diagnosis not present

## 2017-06-18 DIAGNOSIS — I4821 Permanent atrial fibrillation: Secondary | ICD-10-CM

## 2017-06-18 DIAGNOSIS — I10 Essential (primary) hypertension: Secondary | ICD-10-CM

## 2017-06-18 LAB — BASIC METABOLIC PANEL
BUN / CREAT RATIO: 21 (ref 12–28)
BUN: 26 mg/dL (ref 8–27)
CALCIUM: 10.4 mg/dL — AB (ref 8.7–10.3)
CO2: 24 mmol/L (ref 20–29)
CREATININE: 1.26 mg/dL — AB (ref 0.57–1.00)
Chloride: 100 mmol/L (ref 96–106)
GFR calc non Af Amer: 39 mL/min/{1.73_m2} — ABNORMAL LOW (ref >59)
GFR, EST AFRICAN AMERICAN: 45 mL/min/{1.73_m2} — AB (ref >59)
GLUCOSE: 91 mg/dL (ref 65–99)
Potassium: 4.2 mmol/L (ref 3.5–5.2)
Sodium: 142 mmol/L (ref 134–144)

## 2017-06-18 NOTE — Progress Notes (Signed)
Cardiology Office Note    Date:  06/18/2017   ID:  Lori Bridges, DOB 1930-02-10, MRN [id number]  PCP:  Derinda Late, MD  Cardiologist:  Fransico Him, MD   Chief Complaint  Patient presents with  . Follow-up  . Atrial Fibrillation  . Hypertension  . Congestive Heart Failure    History of Present Illness:  Lori Bridges is a 83 y.o. female with a history of GERD, HTN, hyperlipidemia, permanent atrial fibrillation on chronic coumadin and chronic diastolic CHF.  She has a history of severe TR by echo with RV volume overload with mild RVE and moderate pulmonary HTN with PASP 56mmHg. This dates back to 2013 by echo. When I last saw her she was having increased LE edema with elevated BNP at 481 and 2D echo showed normal LVF with moderate PHTN with PASP 40mmHg.  It was felt that her LE edema was secondary to diastolic dysfunction, dietary indiscretion with sodium, RHF and cardizem.  Her Cardizem was changed to Toprol.  Nuclear stress test was low risk.  Initial workup for PHTN included ANA, sed rate and RF which were normal.  Sleep study was ordered but patient cancelled and PFTs were incomplete.  She is doing well today.  She still occasionally has some mild chest pain over her sternum that her PCP thinks is related to costochondritis.  She has chronic SOB but thinks part of it is anxiety.  Her LE edema has resolved on the Lasix.  She occasionally feels her heart beat faster when she goes to the bathroom.  She denies any PND or orthopnea. She has lost 17lbs on diuretics.    Past Medical History:  Diagnosis Date  . Adenomatous colon polyp 2005  . Anxiety age 19  . Carotid artery bruit 2001   left  . DDD (degenerative disc disease)   . DJD (degenerative joint disease)   . Gastroenteritis 2011  . GERD (gastroesophageal reflux disease) 2003  . History of pneumonia   . HTN (hypertension) 1983  . Hyperlipidemia 2000  . Hypothyroidism 2009  . Iron deficiency anemia 1983  .  Osteoarthritis 2001  . Osteoporosis 2001  . Persistent atrial fibrillation ([place]) 07/23/2010  . Persistent headaches 1987  . Pulmonary HTN ([place]) 04/03/2017   Moderate with PASP 47mmHg by echo 02/2017  . Tricuspid regurgitation    severe by echo 02/25/12  . Vertigo   . Vitamin D deficiency 2011    Past Surgical History:  Procedure Laterality Date  . COLONOSCOPY W/ BIOPSIES AND POLYPECTOMY  09/16/2004   adenomatous polyp, diverticulosis, internal hemorrhoids  . s/p BLT    . TUBAL LIGATION      Current Medications: Current Meds  Medication Sig  . Cholecalciferol (VITAMIN D3) 2000 units TABS Take 2,000 Units by mouth daily.  . Cyanocobalamin (VITAMIN B-12 PO) Take 1 tablet by mouth daily.  . furosemide (LASIX) 40 MG tablet Take 1 tablet (40 mg total) by mouth daily.  Marland Kitchen LORazepam (ATIVAN) 1 MG tablet Take 1 mg by mouth 2 (two) times daily as needed (nervousness).   . metoprolol succinate (TOPROL-XL) 100 MG 24 hr tablet Take 1 tablet (100 mg total) by mouth daily.  . pantoprazole (PROTONIX) 40 MG tablet Take 40 mg by mouth daily as needed (heartburn or acid reflux).   . SYNTHROID 50 MCG tablet Take 1 tablet by mouth daily OES  . triamcinolone cream (KENALOG) 0.1 % APPLY ONE APPLICATION TO RASH TWICE DAILY AS NEEDED. Use as directed  .  valsartan (DIOVAN) 160 MG tablet Take 160 mg by mouth daily.  Marland Kitchen warfarin (COUMADIN) 2 MG tablet TAKE AS DIRECTED BY COUMADIN CLINIC  . [DISCONTINUED] ZOCOR 80 MG tablet Take 0.25 tablets by mouth daily.    Allergies:   Meclizine; Pravastatin; Zetia [ezetimibe]; Sulfamethoxazole; Ace inhibitors; Codeine; Colestipol hcl; Doxycycline; Lovastatin; and Sulfa antibiotics   Social History   Social History  . Marital status: Widowed    Spouse name: N/A  . Number of children: 7  . Years of education: N/A   Occupational History  . Retired     [employer]   Social History Main Topics  . Smoking status: Never Smoker  . Smokeless tobacco: Never Used  . Alcohol  use No  . Drug use: No  . Sexual activity: Not Asked   Other Topics Concern  . None   Social History Narrative  . None     Family History:  The patient's family history includes Diabetes in her mother; Heart disease (age of onset: 15) in her brother; Hypertension in her father; Liver cancer in her sister; Peripheral vascular disease in her father.   ROS:   Please see the history of present illness.    ROS All other systems reviewed and are negative.  No flowsheet data found.     PHYSICAL EXAM:   VS:  BP 120/84   Pulse 85   Ht 5\' 6"  (1.676 m)   Wt 162 lb (73.5 kg)   BMI 26.15 kg/m    GEN: Well nourished, well developed, in no acute distress  HEENT: normal  Neck: no JVD, carotid bruits, or masses Cardiac: RRR; no murmurs, rubs, or gallops,no edema.  Intact distal pulses bilaterally.  Respiratory:  clear to auscultation bilaterally, normal work of breathing GI: soft, nontender, nondistended, + BS MS: no deformity or atrophy  Skin: warm and dry, no rash Neuro:  Alert and Oriented x 3, Strength and sensation are intact Psych: euthymic mood, full affect  Wt Readings from Last 3 Encounters:  06/18/17 162 lb (73.5 kg)  04/22/17 163 lb (73.9 kg)  04/14/17 179 lb (81.2 kg)      Studies/Labs Reviewed:   EKG:  EKG is not ordered today.    Recent Labs: 11/26/2016: ALT 17; Hemoglobin 15.8; Platelets 203 04/09/2017: BUN 26; Creatinine, Ser 1.19; Potassium 4.8; Sodium 142   Lipid Panel No results found for: CHOL, TRIG, HDL, CHOLHDL, VLDL, LDLCALC, LDLDIRECT  Additional studies/ records that were reviewed today include:  none    ASSESSMENT:    1. Chronic diastolic CHF (congestive heart failure) (HCC)   2. Chest pain, unspecified type   3. Permanent atrial fibrillation (HCC)   4. Pulmonary HTN ([place])   5. Non-rheumatic tricuspid valve insufficiency   6. HYPERTENSION, MILD      PLAN:  In order of problems listed above:  1.  Chronic Diastolic HF: recent echo  02/2017 with normal LVEF. She appears euvolemic on exam and weight is stable. Her edema has resolved as well as dyspnea on her diuretics.  Sometimes she still has some mild SOB but her daughter says that she has significant anxiety. She is tolerating lasix well.  She has reduced sodium consumption. She will continue lasix, BB and low sodium diet. I will check a BMET today.  2. Chest Pain: atypical CP with nuclear stress test showing no ischemia and low risk.  This is likely related to MSK etiology.   3. Chronic Atrial Fibrillation: rate is controlled with BB. HR in  the 80s. She will continue on coumadin.  She denies any bleeding problems.   4. Pulmonary HTN: recent echo showed moderate Pulomonary HTN. PA pressure 46 mgHg. ? Etiology. Sed Rate, ANA and RF negative. PFTs were incomplete and she cancelled the Sleep study. VQ scan showed no PE.  This is likely WHO Group 2 secondary to pulmonary venous HTN with diastolic dysfunction.  She is adamant that she cannot due the sleep study because she does not want anything on her face to measure airflow due to anxiety.  I will refer her to Dr. Aundra Dubin for further evaluation.  5. Tricuspid Regurgitation: severe by echo 02/2017.   6.  HTN - Her BP is adequately controlled on exam today.  She will continue on Valsartan 160mg  daily, Toprol XL 100mg  daily     Medication Adjustments/Labs and Tests Ordered: Current medicines are reviewed at length with the patient today.  Concerns regarding medicines are outlined above.  Medication changes, Labs and Tests ordered today are listed in the Patient Instructions below.  There are no Patient Instructions on file for this visit.   Signed, Fransico Him, MD  06/18/2017 8:17 AM    [address], [place], [place]  [address] Phone: [phone]; Fax: [phone]

## 2017-06-18 NOTE — Patient Instructions (Signed)
Medication Instructions:  Your physician recommends that you continue on your current medications as directed. Please refer to the Current Medication list given to you today.   Labwork: TODAY: BMET  Testing/Procedures: None  Follow-Up: Your physician wants you to follow-up in: 6 months with Dr. Theodosia Blender assistant. You will receive a reminder letter in the mail two months in advance. If you don't receive a letter, please call our office to schedule the follow-up appointment.   Your physician wants you to follow-up in: 1 year with Dr. Radford Pax. You will receive a reminder letter in the mail two months in advance. If you don't receive a letter, please call our office to schedule the follow-up appointment.   Any Other Special Instructions Will Be Listed Below (If Applicable).     If you need a refill on your cardiac medications before your next appointment, please call your pharmacy.

## 2017-06-22 NOTE — Progress Notes (Signed)
Pt was unable to complete test due to inablitiy to exhale the required amount of time. Multiple attempts were made as well as extensive coaching wothout success.  No charge.

## 2017-07-07 ENCOUNTER — Other Ambulatory Visit: Payer: Self-pay | Admitting: Family Medicine

## 2017-07-07 ENCOUNTER — Ambulatory Visit (INDEPENDENT_AMBULATORY_CARE_PROVIDER_SITE_OTHER): Payer: Medicare Other | Admitting: *Deleted

## 2017-07-07 DIAGNOSIS — Z5181 Encounter for therapeutic drug level monitoring: Secondary | ICD-10-CM

## 2017-07-07 DIAGNOSIS — Z7901 Long term (current) use of anticoagulants: Secondary | ICD-10-CM

## 2017-07-07 DIAGNOSIS — K117 Disturbances of salivary secretion: Secondary | ICD-10-CM

## 2017-07-07 DIAGNOSIS — I482 Chronic atrial fibrillation: Secondary | ICD-10-CM | POA: Diagnosis not present

## 2017-07-07 DIAGNOSIS — I4891 Unspecified atrial fibrillation: Secondary | ICD-10-CM

## 2017-07-07 DIAGNOSIS — R1310 Dysphagia, unspecified: Secondary | ICD-10-CM

## 2017-07-07 DIAGNOSIS — I4821 Permanent atrial fibrillation: Secondary | ICD-10-CM

## 2017-07-07 LAB — POCT INR: INR: 2.8

## 2017-07-13 ENCOUNTER — Ambulatory Visit
Admission: RE | Admit: 2017-07-13 | Discharge: 2017-07-13 | Disposition: A | Discharge status: Home / Self Care | Payer: Medicare Other | Source: Ambulatory Visit | Attending: Family Medicine | Admitting: Family Medicine

## 2017-07-13 DIAGNOSIS — R1310 Dysphagia, unspecified: Secondary | ICD-10-CM

## 2017-07-13 DIAGNOSIS — K117 Disturbances of salivary secretion: Secondary | ICD-10-CM

## 2017-07-21 ENCOUNTER — Telehealth: Payer: Self-pay | Admitting: *Deleted

## 2017-07-21 NOTE — Telephone Encounter (Signed)
Pt called and said Valsartan is discontinued and will be on Irbesartan 150mg  daily Pt instructed that there is no interaction between coumadin and Irbesartan and pt states understanding

## 2017-08-18 ENCOUNTER — Ambulatory Visit (INDEPENDENT_AMBULATORY_CARE_PROVIDER_SITE_OTHER): Payer: Medicare Other

## 2017-08-18 DIAGNOSIS — Z5181 Encounter for therapeutic drug level monitoring: Secondary | ICD-10-CM | POA: Diagnosis not present

## 2017-08-18 DIAGNOSIS — I4891 Unspecified atrial fibrillation: Secondary | ICD-10-CM

## 2017-08-18 DIAGNOSIS — I482 Chronic atrial fibrillation: Secondary | ICD-10-CM | POA: Diagnosis not present

## 2017-08-18 DIAGNOSIS — I4821 Permanent atrial fibrillation: Secondary | ICD-10-CM

## 2017-08-18 DIAGNOSIS — Z7901 Long term (current) use of anticoagulants: Secondary | ICD-10-CM

## 2017-08-18 LAB — POCT INR: INR: 3.1

## 2017-09-29 ENCOUNTER — Ambulatory Visit (INDEPENDENT_AMBULATORY_CARE_PROVIDER_SITE_OTHER): Payer: Medicare Other | Admitting: *Deleted

## 2017-09-29 DIAGNOSIS — Z5181 Encounter for therapeutic drug level monitoring: Secondary | ICD-10-CM

## 2017-09-29 DIAGNOSIS — I482 Chronic atrial fibrillation: Secondary | ICD-10-CM | POA: Diagnosis not present

## 2017-09-29 DIAGNOSIS — I4891 Unspecified atrial fibrillation: Secondary | ICD-10-CM | POA: Diagnosis not present

## 2017-09-29 DIAGNOSIS — Z7901 Long term (current) use of anticoagulants: Secondary | ICD-10-CM

## 2017-09-29 DIAGNOSIS — I4821 Permanent atrial fibrillation: Secondary | ICD-10-CM

## 2017-09-29 LAB — POCT INR: INR: 2.2

## 2017-10-19 ENCOUNTER — Other Ambulatory Visit: Payer: Self-pay | Admitting: Internal Medicine

## 2017-10-26 ENCOUNTER — Ambulatory Visit (INDEPENDENT_AMBULATORY_CARE_PROVIDER_SITE_OTHER): Payer: Medicare Other | Admitting: *Deleted

## 2017-10-26 DIAGNOSIS — I4821 Permanent atrial fibrillation: Secondary | ICD-10-CM

## 2017-10-26 DIAGNOSIS — Z7901 Long term (current) use of anticoagulants: Secondary | ICD-10-CM

## 2017-10-26 DIAGNOSIS — I4891 Unspecified atrial fibrillation: Secondary | ICD-10-CM | POA: Diagnosis not present

## 2017-10-26 DIAGNOSIS — Z5181 Encounter for therapeutic drug level monitoring: Secondary | ICD-10-CM

## 2017-10-26 DIAGNOSIS — I482 Chronic atrial fibrillation: Secondary | ICD-10-CM | POA: Diagnosis not present

## 2017-10-26 LAB — POCT INR: INR: 2.2

## 2017-12-15 ENCOUNTER — Ambulatory Visit (INDEPENDENT_AMBULATORY_CARE_PROVIDER_SITE_OTHER): Payer: Medicare Other | Admitting: Pharmacist

## 2017-12-15 DIAGNOSIS — Z7901 Long term (current) use of anticoagulants: Secondary | ICD-10-CM

## 2017-12-15 DIAGNOSIS — I4891 Unspecified atrial fibrillation: Secondary | ICD-10-CM | POA: Diagnosis not present

## 2017-12-15 DIAGNOSIS — Z5181 Encounter for therapeutic drug level monitoring: Secondary | ICD-10-CM | POA: Diagnosis not present

## 2017-12-15 DIAGNOSIS — I4821 Permanent atrial fibrillation: Secondary | ICD-10-CM

## 2017-12-15 DIAGNOSIS — I482 Chronic atrial fibrillation: Secondary | ICD-10-CM

## 2017-12-15 LAB — POCT INR: INR: 2.2

## 2017-12-15 NOTE — Patient Instructions (Signed)
Description   Continue taking same dosage 1 tablet every day. Recheck in 6 weeks. Call us any new medication change # [phone].      

## 2018-01-05 ENCOUNTER — Ambulatory Visit (INDEPENDENT_AMBULATORY_CARE_PROVIDER_SITE_OTHER): Payer: Medicare Other | Admitting: Physician Assistant

## 2018-01-05 ENCOUNTER — Encounter: Payer: Self-pay | Admitting: Physician Assistant

## 2018-01-05 VITALS — BP 136/90 | HR 81 | Ht 66.0 in | Wt 171.8 lb

## 2018-01-05 DIAGNOSIS — I272 Pulmonary hypertension, unspecified: Secondary | ICD-10-CM

## 2018-01-05 DIAGNOSIS — I482 Chronic atrial fibrillation: Secondary | ICD-10-CM

## 2018-01-05 DIAGNOSIS — I361 Nonrheumatic tricuspid (valve) insufficiency: Secondary | ICD-10-CM

## 2018-01-05 DIAGNOSIS — I5032 Chronic diastolic (congestive) heart failure: Secondary | ICD-10-CM | POA: Diagnosis not present

## 2018-01-05 DIAGNOSIS — I4821 Permanent atrial fibrillation: Secondary | ICD-10-CM

## 2018-01-05 DIAGNOSIS — I10 Essential (primary) hypertension: Secondary | ICD-10-CM | POA: Diagnosis not present

## 2018-01-05 NOTE — Patient Instructions (Addendum)
Medication Instructions:  1) INCREASE LASIX to 60 mg (this is 1.5 tablets) for 3 DAYS. Then decrease back to 40 mg daily.  Labwork: TODAY: BMET, BNP  Testing/Procedures: None  Follow-Up: You have an appointment scheduled with Dr. Radford Pax on 04/07/2018 at 12:40 PM. Please arrive 15 minutes early to this appointment.  Any Other Special Instructions Will Be Listed Below (If Applicable).     If you need a refill on your cardiac medications before your next appointment, please call your pharmacy.

## 2018-01-05 NOTE — Progress Notes (Signed)
Cardiology Office Note    Date:  01/05/2018   ID:  Lori Bridges, DOB Jul 28, 1930, MRN [id number]  PCP:  Derinda Late, MD  Cardiologist: Dr. Radford Pax  Chief Complaint  Patient presents with  . Follow-up    History of Present Illness:  Lori Bridges is a 84 y.o. female with history of permanent atrial fibrillation on Coumadin, chronic diastolic CHF, hypertension, hyperlipidemia.  Has history of severe TR by echo with RV volume overload and mild RV enlargement and moderate pulmonary hypertension with PAS P 46 mmHg.  Workup for pulmonary hypertension include normal ANA, sed rate and RF.  Sleep study was never complete.  She has had lower extremity edema felt secondary to diastolic dysfunction.  Saw Dr. Radford Pax 05/2017 and was having mild chest pain over sternum that PCP felt was costochondritis.  Chronic shortness of breath felt to be anxiety.  It was felt that she probably had who group 2 secondary to pulmonary venous hypertension with diastolic dysfunction.  She refused sleep study because she did not want anything on her face to measure airflow due to anxiety.  She was referred to Dr. Algernon Huxley for further evaluation.  Patient comes in today by herself.  She seems a little confused.  Her weight is up 9 pounds.  She says it is because of the holidays.  She does not think she has extra fluid.  She recently had shingles and has had trouble recovering from that.  She has chronic dyspnea on exertion but says is unchanged.  She says sometimes her heart races in the early morning when she urinates but after she takes her medicines its stable.  He cannot remember any referral to Dr. Algernon Huxley and does not think she wants to have a  right heart catheterization.    Past Medical History:  Diagnosis Date  . Adenomatous colon polyp 2005  . Anxiety age 46  . Carotid artery bruit 2001   left  . DDD (degenerative disc disease)   . DJD (degenerative joint disease)   . Gastroenteritis 2011  . GERD  (gastroesophageal reflux disease) 2003  . History of pneumonia   . HTN (hypertension) 1983  . Hyperlipidemia 2000  . Hypothyroidism 2009  . Iron deficiency anemia 1983  . Osteoarthritis 2001  . Osteoporosis 2001  . Persistent atrial fibrillation ([place]) 07/23/2010       . Persistent headaches 1987  . Pulmonary HTN ([place]) 04/03/2017   Moderate with PASP 59mHg by echo 02/2017  . Tricuspid regurgitation    severe by echo 02/25/12  . Vertigo   . Vitamin D deficiency 2011    Past Surgical History:  Procedure Laterality Date  . COLONOSCOPY W/ BIOPSIES AND POLYPECTOMY  09/16/2004   adenomatous polyp, diverticulosis, internal hemorrhoids  . s/p BLT    . TUBAL LIGATION      Current Medications: Current Meds  Medication Sig  . Cholecalciferol (VITAMIN D3) 2000 units TABS Take 2,000 Units by mouth daily.  .Marland KitchenCOUMADIN 2 MG tablet TAKE AS DIRECTED BY COUMADIN CLINIC  . Cyanocobalamin (VITAMIN B-12 PO) Take 1 tablet by mouth daily.  . furosemide (LASIX) 40 MG tablet Take 40 mg by mouth daily.  . irbesartan (AVAPRO) 150 MG tablet Take 150 mg by mouth daily.  .Marland KitchenLORazepam (ATIVAN) 1 MG tablet Take 1 mg by mouth 2 (two) times daily as needed (nervousness).   . metoprolol succinate (TOPROL-XL) 100 MG 24 hr tablet Take 1 tablet (100 mg total) by mouth daily.  .Marland Kitchen  pantoprazole (PROTONIX) 40 MG tablet Take 40 mg by mouth daily as needed (heartburn or acid reflux).   . SYNTHROID 50 MCG tablet Take 1 tablet by mouth daily OES  . triamcinolone cream (KENALOG) 0.1 % APPLY ONE APPLICATION TO RASH TWICE DAILY AS NEEDED. Use as directed     Allergies:   Meclizine; Pravastatin; Zetia [ezetimibe]; Sulfamethoxazole; Ace inhibitors; Codeine; Colestipol hcl; Doxycycline; Lovastatin; and Sulfa antibiotics   Social History   Socioeconomic History  . Marital status: Widowed    Spouse name: None  . Number of children: 7  . Years of education: None  . Highest education level: None  Social Needs  . Financial  resource strain: None  . Food insecurity - worry: None  . Food insecurity - inability: None  . Transportation needs - medical: None  . Transportation needs - non-medical: None  Occupational History  . Occupation: Retired    Comment: Production assistant, radio  Tobacco Use  . Smoking status: Never Smoker  . Smokeless tobacco: Never Used  Substance and Sexual Activity  . Alcohol use: No  . Drug use: No  . Sexual activity: None  Other Topics Concern  . None  Social History Narrative  . None     Family History:  The patient's family history includes Diabetes in her mother; Heart disease (age of onset: 74) in her brother; Hypertension in her father; Liver cancer in her sister; Peripheral vascular disease in her father.   ROS:   Please see the history of present illness.    Review of Systems  Constitution: Negative.  HENT: Negative.   Eyes: Negative.   Cardiovascular: Positive for dyspnea on exertion and palpitations.  Respiratory: Negative.   Hematologic/Lymphatic: Negative.   Skin: Positive for itching and rash.  Musculoskeletal: Negative.  Negative for joint pain.  Gastrointestinal: Negative.   Genitourinary: Negative.   Neurological: Negative.   Psychiatric/Behavioral: The patient is nervous/anxious.    All other systems reviewed and are negative.   PHYSICAL EXAM:   VS:  BP 136/90   Pulse 81   Ht '5\' 6"'  (1.676 m)   Wt 171 lb 12.8 oz (77.9 kg)   BMI 27.73 kg/m   Physical Exam  GEN: Well nourished, well developed, in no acute distress  Neck: Increased JVD, no carotid bruits, or masses Cardiac: Irregular irregular with 3/6 systolic murmur at the left sternal border Respiratory:  clear to auscultation bilaterally, normal work of breathing GI: soft, nontender, nondistended, + BS Ext: Trace of edema bilaterally without cyanosis, clubbing, Good distal pulses bilaterally Neuro:  Alert and Oriented x 3, Psych: euthymic mood, full affect  Wt Readings from Last 3 Encounters:  01/05/18  171 lb 12.8 oz (77.9 kg)  06/18/17 162 lb (73.5 kg)  04/22/17 163 lb (73.9 kg)      Studies/Labs Reviewed:   EKG:  EKG is not ordered today.    Recent Labs: 06/18/2017: BUN 26; Creatinine, Ser 1.26; Potassium 4.2; Sodium 142   Lipid Panel No results found for: CHOL, TRIG, HDL, CHOLHDL, VLDL, LDLCALC, LDLDIRECT  Additional studies/ records that were reviewed today include:  2D echo 3/19/18Study Conclusions   - Left ventricle: The cavity size was normal. Systolic function was   normal. The estimated ejection fraction was in the range of 55%   to 60%. Wall motion was normal; there were no regional wall   motion abnormalities. - Ventricular septum: Septal motion showed paradox. The contour   showed diastolic flattening. These changes are consistent with RV  volume overload. - Aortic valve: There was trivial regurgitation. - Right ventricle: The cavity size was mildly dilated. Wall   thickness was normal. - Right atrium: The atrium was severely dilated. - Tricuspid valve: Dilated annulus. Structurally normal valve.   There was malcoaptation of the valve leaflets. There was severe   regurgitation directed centrally. - Pulmonary arteries: Systolic pressure was mildly to moderately   increased. PA peak pressure: 46 mm Hg (S).    Nuclear stress test 4/2018Study Highlights      There was no ST segment deviation noted during stress. Non-gated study. Atrial fibrillation.  This is a low risk study. No perfusion defects identified.   Candee Furbish, MD        ASSESSMENT:    1. Permanent atrial fibrillation ([place])   2. HYPERTENSION, MILD   3. Pulmonary HTN ([place])   4. Chronic diastolic CHF (congestive heart failure) ([place])   5. Non-rheumatic tricuspid valve insufficiency      PLAN:  In order of problems listed above:  Permanent atrial fibrillation on Coumadin, rate controlled today.  Has early morning palpitations but overall her rate has been controlled after she takes her  medicines.  Essential hypertension blood pressure is up today.  Patient says she thinks it is due to the anxiety.  She does not want me to increase her medications.She thinks it's due to anxiety and will check it at home. Also got off track with her diet over the holidays. 2 gram sodium diet.  Pulmonary hypertension echo 02/2017 felt to be mild to moderate.  Previous sed rate and ANA and RF were negative.  Patient refuses PFTs and sleep study because of anxiety.  She was referred to Dr. Marigene Ehlers for consideration of right heart cath but was never seen.  Patient does not think she is interested in this.  We will have her follow-up with Dr. Radford Pax to discuss further.  Chronic diastolic CHF normal LVEF on echo I think patient has a little bit of fluid overload on board today.  She has 9 pound weight gain and increased JVD.  Will give Lasix 60 mg daily for 3 days then back to 40 mg daily.  Check BNP and be met.  Severe TR and last echo. Medication Adjustments/Labs and Tests Ordered: Current medicines are reviewed at length with the patient today.  Concerns regarding medicines are outlined above.  Medication changes, Labs and Tests ordered today are listed in the Patient Instructions below. Patient Instructions  Medication Instructions:  1) INCREASE LASIX to 60 mg (this is 1.5 tablets) for 3 DAYS. Then decrease back to 40 mg daily.  Labwork: TODAY: BMET, BNP  Testing/Procedures: None  Follow-Up: You have an appointment scheduled with Dr. Radford Pax on 04/07/2018 at 12:40 PM. Please arrive 15 minutes early to this appointment.  Any Other Special Instructions Will Be Listed Below (If Applicable).     If you need a refill on your cardiac medications before your next appointment, please call your pharmacy.      Signed, Ermalinda Barrios, PA-C  01/05/2018 1:24 PM    [facility] [address], [place], [place]  [address] Phone: [phone]; Fax: [phone]

## 2018-01-06 ENCOUNTER — Telehealth: Payer: Self-pay | Admitting: *Deleted

## 2018-01-06 DIAGNOSIS — Z79899 Other long term (current) drug therapy: Secondary | ICD-10-CM

## 2018-01-06 LAB — BASIC METABOLIC PANEL
BUN/Creatinine Ratio: 21 (ref 12–28)
BUN: 24 mg/dL (ref 8–27)
CALCIUM: 10.3 mg/dL (ref 8.7–10.3)
CHLORIDE: 102 mmol/L (ref 96–106)
CO2: 26 mmol/L (ref 20–29)
Creatinine, Ser: 1.15 mg/dL — ABNORMAL HIGH (ref 0.57–1.00)
GFR calc Af Amer: 50 mL/min/{1.73_m2} — ABNORMAL LOW (ref >59)
GFR calc non Af Amer: 43 mL/min/{1.73_m2} — ABNORMAL LOW (ref >59)
GLUCOSE: 99 mg/dL (ref 65–99)
POTASSIUM: 4.5 mmol/L (ref 3.5–5.2)
Sodium: 145 mmol/L — ABNORMAL HIGH (ref 134–144)

## 2018-01-06 LAB — PRO B NATRIURETIC PEPTIDE: NT-PRO BNP: 2954 pg/mL — AB (ref 0–738)

## 2018-01-06 MED ORDER — FUROSEMIDE 40 MG PO TABS: ORAL_TABLET | ORAL | 3 refills | Status: DC

## 2018-01-06 NOTE — Telephone Encounter (Signed)
-----   Message from Imogene Burn, PA-C sent at 01/06/2018 10:06 AM EST ----- Heart failure marker was quite high.  Recommend she increase her Lasix to 80 mg daily for 3 days then back to 40 mg daily.  Reiterate 2 g sodium diet.  Repeat bmet and BNP in 2 weeks

## 2018-01-18 ENCOUNTER — Telehealth: Payer: Self-pay | Admitting: *Deleted

## 2018-01-18 ENCOUNTER — Other Ambulatory Visit: Payer: Medicare Other | Admitting: *Deleted

## 2018-01-18 DIAGNOSIS — Z79899 Other long term (current) drug therapy: Secondary | ICD-10-CM

## 2018-01-18 LAB — BASIC METABOLIC PANEL
BUN / CREAT RATIO: 20 (ref 12–28)
BUN: 33 mg/dL — ABNORMAL HIGH (ref 8–27)
CO2: 27 mmol/L (ref 20–29)
CREATININE: 1.65 mg/dL — AB (ref 0.57–1.00)
Calcium: 10.6 mg/dL — ABNORMAL HIGH (ref 8.7–10.3)
Chloride: 98 mmol/L (ref 96–106)
GFR calc Af Amer: 32 mL/min/{1.73_m2} — ABNORMAL LOW (ref >59)
GFR calc non Af Amer: 28 mL/min/{1.73_m2} — ABNORMAL LOW (ref >59)
GLUCOSE: 95 mg/dL (ref 65–99)
Potassium: 3.9 mmol/L (ref 3.5–5.2)
SODIUM: 141 mmol/L (ref 134–144)

## 2018-01-18 LAB — PRO B NATRIURETIC PEPTIDE: NT-PRO BNP: 2213 pg/mL — AB (ref 0–738)

## 2018-01-18 NOTE — Telephone Encounter (Signed)
-----   Message from Liliane Shi, [place] sent at 01/18/2018  4:16 PM EST ----- Results reviewed for Lori Barrios, PA-C  Please call patient. Creatinine increased some.  BNP slightly improved. Hold Lasix x 1 dose, then resume. BMET 1 week (order under Lori Barrios, PA-C).   Richardson Dopp, PA-C    01/18/2018 4:12 PM

## 2018-01-19 ENCOUNTER — Telehealth: Payer: Self-pay | Admitting: Physician Assistant

## 2018-01-19 NOTE — Telephone Encounter (Signed)
Lori Bridges is calling to see if she can get an appt with Dr. Aundra Dubin . Please call  Thanks

## 2018-01-19 NOTE — Telephone Encounter (Signed)
Returned call to patient. Patient's family member states patient was not home at the time and to call her at [phone]. Called number and left message to call back.

## 2018-01-19 NOTE — Telephone Encounter (Signed)
This is an established pt of Dr Theodosia Blender.  Will forward to Dr. Theodosia Blender RN for further follow-up.    Below is Lori Husk PA-C last OV note with the pt on 01/05/18 In order of problems listed above:  Permanent atrial fibrillation on Coumadin, rate controlled today.  Has early morning palpitations but overall her rate has been controlled after she takes her medicines.  Essential hypertension blood pressure is up today.  Patient says she thinks it is due to the anxiety.  She does not want me to increase her medications.She thinks it's due to anxiety and will check it at home. Also got off track with her diet over the holidays. 2 gram sodium diet.  Pulmonary hypertension echo 02/2017 felt to be mild to moderate.  Previous sed rate and ANA and RF were negative.  Patient refuses PFTs and sleep study because of anxiety.  She was referred to Dr. Marigene Ehlers for consideration of right heart cath but was never seen.  Patient does not think she is interested in this.  We will have her follow-up with Dr. Radford Pax to discuss further.  Chronic diastolic CHF normal LVEF on echo I think patient has a little bit of fluid overload on board today.  She has 9 pound weight gain and increased JVD.  Will give Lasix 60 mg daily for 3 days then back to 40 mg daily.  Check BNP and be met.

## 2018-01-19 NOTE — Telephone Encounter (Signed)
Scarlette Calico, RN sent to Teressa Senter, RN        Ok we will get her scheduled thanks   Previous Messages    ----- Message -----  From: Teressa Senter, RN  Sent: 01/19/2018 11:44 AM  To: Scarlette Calico, RN  Subject: FW: APPOINTMENT                  Good Morning,   I spoke with patient. Patient states that Dr. Oda Cogan advised the patient to speak with Dr. Aundra Dubin prior to scheduling [place]. Patient is wanting an appt with Dr. Aundra Dubin per the request of Dr. Oda Cogan.   Thanks  [employer]

## 2018-01-26 ENCOUNTER — Other Ambulatory Visit: Payer: Medicare Other | Admitting: *Deleted

## 2018-01-26 ENCOUNTER — Ambulatory Visit (INDEPENDENT_AMBULATORY_CARE_PROVIDER_SITE_OTHER): Payer: Medicare Other | Admitting: *Deleted

## 2018-01-26 DIAGNOSIS — I4821 Permanent atrial fibrillation: Secondary | ICD-10-CM

## 2018-01-26 DIAGNOSIS — I4891 Unspecified atrial fibrillation: Secondary | ICD-10-CM | POA: Diagnosis not present

## 2018-01-26 DIAGNOSIS — Z7901 Long term (current) use of anticoagulants: Secondary | ICD-10-CM | POA: Diagnosis not present

## 2018-01-26 DIAGNOSIS — Z5181 Encounter for therapeutic drug level monitoring: Secondary | ICD-10-CM | POA: Diagnosis not present

## 2018-01-26 DIAGNOSIS — Z79899 Other long term (current) drug therapy: Secondary | ICD-10-CM

## 2018-01-26 DIAGNOSIS — I482 Chronic atrial fibrillation: Secondary | ICD-10-CM | POA: Diagnosis not present

## 2018-01-26 LAB — BASIC METABOLIC PANEL
BUN/Creatinine Ratio: 17 (ref 12–28)
BUN: 27 mg/dL (ref 8–27)
CO2: 26 mmol/L (ref 20–29)
Calcium: 10.1 mg/dL (ref 8.7–10.3)
Chloride: 100 mmol/L (ref 96–106)
Creatinine, Ser: 1.55 mg/dL — ABNORMAL HIGH (ref 0.57–1.00)
GFR calc Af Amer: 35 mL/min/{1.73_m2} — ABNORMAL LOW (ref >59)
GFR calc non Af Amer: 30 mL/min/{1.73_m2} — ABNORMAL LOW (ref >59)
GLUCOSE: 83 mg/dL (ref 65–99)
POTASSIUM: 4.3 mmol/L (ref 3.5–5.2)
SODIUM: 142 mmol/L (ref 134–144)

## 2018-01-26 LAB — POCT INR: INR: 2.6

## 2018-01-26 LAB — PRO B NATRIURETIC PEPTIDE: NT-PRO BNP: 3754 pg/mL — AB (ref 0–738)

## 2018-01-26 NOTE — Patient Instructions (Signed)
Description   Continue taking same dosage 1 tablet every day. Recheck in 6 weeks. Call us any new medication change # [phone].      

## 2018-02-01 ENCOUNTER — Encounter (HOSPITAL_COMMUNITY): Payer: Self-pay | Admitting: Cardiology

## 2018-02-01 ENCOUNTER — Ambulatory Visit (HOSPITAL_COMMUNITY)
Admission: RE | Admit: 2018-02-01 | Discharge: 2018-02-01 | Disposition: A | Discharge status: Home / Self Care | Payer: Medicare Other | Source: Ambulatory Visit | Attending: Cardiology | Admitting: Cardiology

## 2018-02-01 VITALS — BP 143/84 | HR 89 | Wt 168.8 lb

## 2018-02-01 DIAGNOSIS — I272 Pulmonary hypertension, unspecified: Secondary | ICD-10-CM | POA: Diagnosis not present

## 2018-02-01 DIAGNOSIS — I482 Chronic atrial fibrillation: Secondary | ICD-10-CM

## 2018-02-01 DIAGNOSIS — Z800 Family history of malignant neoplasm of digestive organs: Secondary | ICD-10-CM | POA: Diagnosis not present

## 2018-02-01 DIAGNOSIS — I130 Hypertensive heart and chronic kidney disease with heart failure and stage 1 through stage 4 chronic kidney disease, or unspecified chronic kidney disease: Secondary | ICD-10-CM | POA: Insufficient documentation

## 2018-02-01 DIAGNOSIS — Z79899 Other long term (current) drug therapy: Secondary | ICD-10-CM | POA: Diagnosis not present

## 2018-02-01 DIAGNOSIS — Z9889 Other specified postprocedural states: Secondary | ICD-10-CM | POA: Diagnosis not present

## 2018-02-01 DIAGNOSIS — I5032 Chronic diastolic (congestive) heart failure: Secondary | ICD-10-CM | POA: Diagnosis not present

## 2018-02-01 DIAGNOSIS — E785 Hyperlipidemia, unspecified: Secondary | ICD-10-CM | POA: Diagnosis not present

## 2018-02-01 DIAGNOSIS — I4821 Permanent atrial fibrillation: Secondary | ICD-10-CM

## 2018-02-01 DIAGNOSIS — E039 Hypothyroidism, unspecified: Secondary | ICD-10-CM | POA: Diagnosis not present

## 2018-02-01 DIAGNOSIS — Z8249 Family history of ischemic heart disease and other diseases of the circulatory system: Secondary | ICD-10-CM | POA: Insufficient documentation

## 2018-02-01 DIAGNOSIS — Z833 Family history of diabetes mellitus: Secondary | ICD-10-CM | POA: Insufficient documentation

## 2018-02-01 DIAGNOSIS — Z7902 Long term (current) use of antithrombotics/antiplatelets: Secondary | ICD-10-CM | POA: Diagnosis not present

## 2018-02-01 DIAGNOSIS — K219 Gastro-esophageal reflux disease without esophagitis: Secondary | ICD-10-CM | POA: Diagnosis not present

## 2018-02-01 DIAGNOSIS — N183 Chronic kidney disease, stage 3 (moderate): Secondary | ICD-10-CM | POA: Diagnosis not present

## 2018-02-01 MED ORDER — FUROSEMIDE 40 MG PO TABS: ORAL_TABLET | ORAL | 3 refills | Status: DC

## 2018-02-01 NOTE — Patient Instructions (Signed)
Increase Furosemide to 40 mg (2 tabs) in AM and 20 mg (1 tab) in PM  Labs in 1 week  Your physician recommends that you schedule a follow-up appointment in: 3 weeks

## 2018-02-03 NOTE — Progress Notes (Signed)
PCP: Dr. Sandi Mariscal Cardiology: Dr. Radford Pax HF Cardiology: Dr. Aundra Dubin  84 yo with history of permanent atrial fibrillation, chronic diastolic CHF, and CKD stage 3 was referred by Dr. Radford Pax for evaluation of CHF/RV failure and pulmonary hypertension.  Her last echo in 3/18 showed normal LV systolic function but D-shaped septum suggestive of RV pressure/volume overload, severe TR, and PASP 46 mmHg.  Looking back, she has had severe central tricuspid regurgitation since at least 2013.    Currently, she lives with her grandson.  She is able to do all her ADLs.  She does not get short of breath walking on flat ground, getting dressed, or bathing.  No chest pain.  No orthopnea/PND. No lightheadedness/syncope. She really only gets short of breath with heavy exertion like walking up stairs fast or carrying something fairly heavy.    Labs (1/19): K 4.3, creatinine 1.55, pro-BNP 3754  PMH: 1. GERD 2. HTN 3. Hyperlipidemia 4. Atrial fibrillation: permanent. 5. Hypothyroidism 6. CKD stage 3 7. Chronic diastolic CHF:  - Echo (5/17): EF 55-60%, mild RV dilation with normal systolic function, D-shaped interventricular septum suggestive of RV pressure/volume overload, severe central TR, PASP 46 mmHg.  8. Tricuspid regurgitation: Noted to be severe since 2013.  9. Pulmonary hypertension: By echo.   - V/Q scan negative for chronic PE. - ANA, RF negative.  10. Cardiolite (4/18): No perfusion defects.   Social History   Socioeconomic History  . Marital status: Widowed    Spouse name: Not on file  . Number of children: 7  . Years of education: Not on file  . Highest education level: Not on file  Social Needs  . Financial resource strain: Not on file  . Food insecurity - worry: Not on file  . Food insecurity - inability: Not on file  . Transportation needs - medical: Not on file  . Transportation needs - non-medical: Not on file  Occupational History  . Occupation: Retired    Comment: Production assistant, radio   Tobacco Use  . Smoking status: Never Smoker  . Smokeless tobacco: Never Used  Substance and Sexual Activity  . Alcohol use: No  . Drug use: No  . Sexual activity: Not on file  Other Topics Concern  . Not on file  Social History Narrative  . Not on file   Family History  Problem Relation Age of Onset  . Hypertension Father   . Peripheral vascular disease Father   . Diabetes Mother   . Heart disease Brother 20  . Liver cancer Sister    ROS: All systems reviewed and negative except as per HPI.  Current Outpatient Medications  Medication Sig Dispense Refill  . Cholecalciferol (VITAMIN D3) 2000 units TABS Take 2,000 Units by mouth daily.    Marland Kitchen COUMADIN 2 MG tablet TAKE AS DIRECTED BY COUMADIN CLINIC 35 tablet 3  . Cyanocobalamin (VITAMIN B-12 PO) Take 1 tablet by mouth daily.    . furosemide (LASIX) 40 MG tablet Take 2 tabs in AM and 1 tab in PM 90 tablet 3  . irbesartan (AVAPRO) 150 MG tablet Take 150 mg by mouth daily.    Marland Kitchen LORazepam (ATIVAN) 1 MG tablet Take 1 mg by mouth 2 (two) times daily as needed (nervousness).     . metoprolol succinate (TOPROL-XL) 100 MG 24 hr tablet Take 1 tablet (100 mg total) by mouth daily. 90 tablet 3  . pantoprazole (PROTONIX) 40 MG tablet Take 40 mg by mouth daily as needed (heartburn or acid reflux).     Marland Kitchen  SYNTHROID 50 MCG tablet Take 1 tablet by mouth daily OES  8  . triamcinolone cream (KENALOG) 0.1 % APPLY ONE APPLICATION TO RASH TWICE DAILY AS NEEDED. Use as directed  1   No current facility-administered medications for this encounter.    BP (!) 143/84   Pulse 89   Wt 168 lb 12.8 oz (76.6 kg)   SpO2 100%   BMI 27.25 kg/m  General: NAD Neck: JVP 8-9 cm, no thyromegaly or thyroid nodule.  Lungs: Clear to auscultation bilaterally with normal respiratory effort. CV: Nondisplaced PMI.  Heart irregular S1/S2, no S3/S4, 2/6 HSM LLSB.  Trace ankle edema.  No carotid bruit.  Normal pedal pulses.  Abdomen: Soft, nontender, no hepatosplenomegaly,  no distention.  Skin: Intact without lesions or rashes.  Neurologic: Alert and oriented x 3.  Psych: Normal affect. Extremities: No clubbing or cyanosis.  HEENT: Normal.   Assessment/Plan: 1. Chronic diastolic CHF: With suspected RV failure in setting of severe TR.  NYHA class II symptoms currently. She is mildly volume overloaded on exam.  - Increase Lasix to 40 qam/20 qpm.  BMET in 1 week.  2. RV failure with severe TR: On echo, RV is mildly dilated with D-shaped septum, but RV systolic function is reasonably preserved.  She does have severe central TR and mild pulmonary hypertension by echo.  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  - As the [facility] is most likely group 2 and given her stated preference for noninvasive management, would hold off on RHC.  Treatment for now will be effective diuresis.  - She has chronic severe TR but is fairly well compensated (only mild volume overload that likely can be treated with increased diuresis). Given her age and modest results from surgical repair of isolated TR, would not evaluate for surgical repair.  - Would be reasonable to evaluate for OSA if not done in the past.  3. Atrial fibrillation: Permanent.  She is on warfarin and Toprol XL for rate control.  4. CKD: Stage 3.  Follow closely with diuresis.   Followup in 3 wks.   Loralie Champagne 02/03/2018

## 2018-02-04 ENCOUNTER — Other Ambulatory Visit (HOSPITAL_COMMUNITY): Payer: Self-pay | Admitting: *Deleted

## 2018-02-04 MED ORDER — FUROSEMIDE 40 MG PO TABS: ORAL_TABLET | ORAL | 3 refills | Status: DC

## 2018-02-08 ENCOUNTER — Ambulatory Visit (HOSPITAL_COMMUNITY)
Admission: RE | Admit: 2018-02-08 | Discharge: 2018-02-08 | Disposition: A | Discharge status: Home / Self Care | Payer: Medicare Other | Source: Ambulatory Visit | Attending: Internal Medicine | Admitting: Internal Medicine

## 2018-02-08 DIAGNOSIS — I5032 Chronic diastolic (congestive) heart failure: Secondary | ICD-10-CM | POA: Diagnosis present

## 2018-02-08 LAB — BASIC METABOLIC PANEL
Anion gap: 10 (ref 5–15)
BUN: 21 mg/dL — AB (ref 6–20)
CALCIUM: 10.1 mg/dL (ref 8.9–10.3)
CO2: 28 mmol/L (ref 22–32)
CREATININE: 1.59 mg/dL — AB (ref 0.44–1.00)
Chloride: 103 mmol/L (ref 101–111)
GFR calc Af Amer: 33 mL/min — ABNORMAL LOW (ref >60)
GFR calc non Af Amer: 28 mL/min — ABNORMAL LOW (ref >60)
GLUCOSE: 79 mg/dL (ref 65–99)
Potassium: 3.7 mmol/L (ref 3.5–5.1)
Sodium: 141 mmol/L (ref 135–145)

## 2018-02-22 ENCOUNTER — Encounter (HOSPITAL_COMMUNITY): Payer: Self-pay

## 2018-02-22 ENCOUNTER — Ambulatory Visit (HOSPITAL_COMMUNITY)
Admission: RE | Admit: 2018-02-22 | Discharge: 2018-02-22 | Disposition: A | Discharge status: Home / Self Care | Payer: Medicare Other | Source: Ambulatory Visit | Attending: Cardiology | Admitting: Cardiology

## 2018-02-22 ENCOUNTER — Other Ambulatory Visit (HOSPITAL_COMMUNITY): Payer: Self-pay | Admitting: *Deleted

## 2018-02-22 VITALS — BP 128/96 | HR 84 | Wt 170.6 lb

## 2018-02-22 DIAGNOSIS — Z8249 Family history of ischemic heart disease and other diseases of the circulatory system: Secondary | ICD-10-CM | POA: Insufficient documentation

## 2018-02-22 DIAGNOSIS — I272 Pulmonary hypertension, unspecified: Secondary | ICD-10-CM | POA: Diagnosis not present

## 2018-02-22 DIAGNOSIS — I482 Chronic atrial fibrillation: Secondary | ICD-10-CM | POA: Diagnosis not present

## 2018-02-22 DIAGNOSIS — E039 Hypothyroidism, unspecified: Secondary | ICD-10-CM | POA: Diagnosis not present

## 2018-02-22 DIAGNOSIS — E785 Hyperlipidemia, unspecified: Secondary | ICD-10-CM | POA: Insufficient documentation

## 2018-02-22 DIAGNOSIS — Z800 Family history of malignant neoplasm of digestive organs: Secondary | ICD-10-CM | POA: Diagnosis not present

## 2018-02-22 DIAGNOSIS — N184 Chronic kidney disease, stage 4 (severe): Secondary | ICD-10-CM | POA: Diagnosis not present

## 2018-02-22 DIAGNOSIS — I5032 Chronic diastolic (congestive) heart failure: Secondary | ICD-10-CM

## 2018-02-22 DIAGNOSIS — Z79899 Other long term (current) drug therapy: Secondary | ICD-10-CM | POA: Insufficient documentation

## 2018-02-22 DIAGNOSIS — Z7901 Long term (current) use of anticoagulants: Secondary | ICD-10-CM | POA: Insufficient documentation

## 2018-02-22 DIAGNOSIS — Z79890 Hormone replacement therapy (postmenopausal): Secondary | ICD-10-CM | POA: Insufficient documentation

## 2018-02-22 DIAGNOSIS — I4821 Permanent atrial fibrillation: Secondary | ICD-10-CM

## 2018-02-22 DIAGNOSIS — I130 Hypertensive heart and chronic kidney disease with heart failure and stage 1 through stage 4 chronic kidney disease, or unspecified chronic kidney disease: Secondary | ICD-10-CM | POA: Diagnosis not present

## 2018-02-22 DIAGNOSIS — Z833 Family history of diabetes mellitus: Secondary | ICD-10-CM | POA: Insufficient documentation

## 2018-02-22 DIAGNOSIS — N183 Chronic kidney disease, stage 3 (moderate): Secondary | ICD-10-CM | POA: Insufficient documentation

## 2018-02-22 DIAGNOSIS — K219 Gastro-esophageal reflux disease without esophagitis: Secondary | ICD-10-CM | POA: Insufficient documentation

## 2018-02-22 MED ORDER — SPIRONOLACTONE 25 MG PO TABS: 12.5000 mg | ORAL_TABLET | Freq: Every day | ORAL | 3 refills | Status: DC

## 2018-02-22 NOTE — Progress Notes (Signed)
PCP: Dr. Sandi Mariscal Cardiology: Dr. Radford Pax HF Cardiology: Dr. Aundra Dubin  84 yo with history of permanent atrial fibrillation, chronic diastolic CHF, and CKD stage 3 was referred by Dr. Radford Pax for evaluation of CHF/RV failure and pulmonary hypertension.  Her last echo in 3/18 showed normal LV systolic function but D-shaped septum suggestive of RV pressure/volume overload, severe TR, and PASP 46 mmHg.  Looking back, she has had severe central tricuspid regurgitation since at least 2013.    Today she returns for HF follow up with her daughter. Says she does not do a lot during the day Last visit lasix was increased 40mg /20mg  daily.Overall feeling fine. Denies SOB/PND/Orthopnea. Denies BRBPR. Appetite ok. No fever or chills. Weight at home hs been stable. Taking all medications.   Labs (1/19): K 4.3, creatinine 1.55, pro-BNP 3754 Labs (02/08/2018): K 3.7 Creatinine 1.59   PMH: 1. GERD 2. HTN 3. Hyperlipidemia 4. Atrial fibrillation: permanent. 5. Hypothyroidism 6. CKD stage 3 7. Chronic diastolic CHF:  - Echo (5/62): EF 55-60%, mild RV dilation with normal systolic function, D-shaped interventricular septum suggestive of RV pressure/volume overload, severe central TR, PASP 46 mmHg.  8. Tricuspid regurgitation: Noted to be severe since 2013.  9. Pulmonary hypertension: By echo.   - V/Q scan negative for chronic PE. - ANA, RF negative.  10. Cardiolite (4/18): No perfusion defects.   Social History   Socioeconomic History  . Marital status: Widowed    Spouse name: Not on file  . Number of children: 7  . Years of education: Not on file  . Highest education level: Not on file  Social Needs  . Financial resource strain: Not on file  . Food insecurity - worry: Not on file  . Food insecurity - inability: Not on file  . Transportation needs - medical: Not on file  . Transportation needs - non-medical: Not on file  Occupational History  . Occupation: Retired    Comment: Production assistant, radio  Tobacco  Use  . Smoking status: Never Smoker  . Smokeless tobacco: Never Used  Substance and Sexual Activity  . Alcohol use: No  . Drug use: No  . Sexual activity: Not on file  Other Topics Concern  . Not on file  Social History Narrative  . Not on file   Family History  Problem Relation Age of Onset  . Hypertension Father   . Peripheral vascular disease Father   . Diabetes Mother   . Heart disease Brother 4  . Liver cancer Sister    ROS: All systems reviewed and negative except as per HPI.  Current Outpatient Medications  Medication Sig Dispense Refill  . Cholecalciferol (VITAMIN D3) 2000 units TABS Take 2,000 Units by mouth daily.    Marland Kitchen COUMADIN 2 MG tablet TAKE AS DIRECTED BY COUMADIN CLINIC 35 tablet 3  . Cyanocobalamin (VITAMIN B-12 PO) Take 1 tablet by mouth daily.    . furosemide (LASIX) 40 MG tablet Take 1 tablet (40mg )  in AM and 1/2 tab (20mg ) in PM 45 tablet 3  . irbesartan (AVAPRO) 150 MG tablet Take 150 mg by mouth daily.    Marland Kitchen LORazepam (ATIVAN) 1 MG tablet Take 1 mg by mouth 2 (two) times daily as needed (nervousness).     . metoprolol succinate (TOPROL-XL) 100 MG 24 hr tablet Take 1 tablet (100 mg total) by mouth daily. 90 tablet 3  . pantoprazole (PROTONIX) 40 MG tablet Take 40 mg by mouth daily as needed (heartburn or acid reflux).     Marland Kitchen  SYNTHROID 50 MCG tablet Take 1 tablet by mouth daily OES  8  . triamcinolone cream (KENALOG) 0.1 % APPLY ONE APPLICATION TO RASH TWICE DAILY AS NEEDED. Use as directed  1   No current facility-administered medications for this encounter.    BP (!) 128/96 (BP Location: Left Arm, Patient Position: Sitting, Cuff Size: Normal)   Pulse 84   Wt 170 lb 9.6 oz (77.4 kg)   SpO2 99%   BMI 27.54 kg/m   General:  Well appearing. No resp difficulty HEENT: normal Neck: supple. no JVD. Carotids 2+ bilat; no bruits. No lymphadenopathy or thryomegaly appreciated. Cor: PMI nondisplaced. Irregular. No rubs, gallops. 2/6 HSM  Lungs: clear Abdomen:  soft, nontender, nondistended. No hepatosplenomegaly. No bruits or masses. Good bowel sounds. Extremities: no cyanosis, clubbing, rash, trace edema Neuro: alert & orientedx3, cranial nerves grossly intact. moves all 4 extremities w/o difficulty. Affect pleasant  Assessment/Plan: 1. Chronic diastolic CHF: With suspected RV failure in setting of severe TR.   NYHA II.  Volume status mildly elevated. Continue lasix 40 mg/20 mg daily. Add 12.5 mg spironolactone daily. Check BMET in 10 days.   2. RV failure with severe TR: On echo, RV is mildly dilated with D-shaped septum, but RV systolic function is reasonably preserved.  She does have severe central TR and mild pulmonary hypertension by echo.  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  - As the [facility] is most likely group 2 and given her stated preference for noninvasive management, would hold off on RHC.  -Continue to treat with diuresis.  - She has chronic severe TR but is fairly well compensated (only mild volume overload that likely can be treated with increased diuresis). Given her age and modest results from surgical repair of isolated TR, would not evaluate for surgical repair.  3. Atrial fibrillation: Permanent.  She is on warfarin and Toprol XL for rate control.  4. CKD: Stage 3. - BMET in 10 day.   Greater than 50% of the (total minutes 25) visit spent in counseling/coordination of care regarding medication change. She has some concerns about medication cost. We talked about changing pharmacies but she does not want to change.   Check BMET in 10 days.     Follow up with 4 weeks with Dr Aundra Dubin.    Amy Clegg NP-C  02/22/2018

## 2018-02-22 NOTE — Patient Instructions (Signed)
START taking Spironolactone 12.5 mg (0.5 Tablet) Once Daily  Labs in 10 days (bmet)  Follow up with Dr. Aundra Dubin in 4 weeks

## 2018-03-06 ENCOUNTER — Other Ambulatory Visit: Payer: Self-pay | Admitting: Internal Medicine

## 2018-03-08 ENCOUNTER — Ambulatory Visit (HOSPITAL_COMMUNITY)
Admission: RE | Admit: 2018-03-08 | Discharge: 2018-03-08 | Disposition: A | Discharge status: Home / Self Care | Payer: Medicare Other | Source: Ambulatory Visit | Attending: Cardiology | Admitting: Cardiology

## 2018-03-08 DIAGNOSIS — I5032 Chronic diastolic (congestive) heart failure: Secondary | ICD-10-CM | POA: Diagnosis present

## 2018-03-08 LAB — BASIC METABOLIC PANEL
Anion gap: 9 (ref 5–15)
BUN: 25 mg/dL — ABNORMAL HIGH (ref 6–20)
CALCIUM: 10.2 mg/dL (ref 8.9–10.3)
CO2: 28 mmol/L (ref 22–32)
CREATININE: 1.72 mg/dL — AB (ref 0.44–1.00)
Chloride: 102 mmol/L (ref 101–111)
GFR calc non Af Amer: 26 mL/min — ABNORMAL LOW (ref >60)
GFR, EST AFRICAN AMERICAN: 30 mL/min — AB (ref >60)
Glucose, Bld: 119 mg/dL — ABNORMAL HIGH (ref 65–99)
Potassium: 3.8 mmol/L (ref 3.5–5.1)
SODIUM: 139 mmol/L (ref 135–145)

## 2018-03-09 ENCOUNTER — Ambulatory Visit (INDEPENDENT_AMBULATORY_CARE_PROVIDER_SITE_OTHER): Payer: Medicare Other | Admitting: *Deleted

## 2018-03-09 DIAGNOSIS — I4891 Unspecified atrial fibrillation: Secondary | ICD-10-CM

## 2018-03-09 DIAGNOSIS — Z7901 Long term (current) use of anticoagulants: Secondary | ICD-10-CM

## 2018-03-09 DIAGNOSIS — I482 Chronic atrial fibrillation: Secondary | ICD-10-CM

## 2018-03-09 DIAGNOSIS — I4821 Permanent atrial fibrillation: Secondary | ICD-10-CM

## 2018-03-09 DIAGNOSIS — Z5181 Encounter for therapeutic drug level monitoring: Secondary | ICD-10-CM

## 2018-03-09 LAB — POCT INR: INR: 2.5

## 2018-03-09 NOTE — Patient Instructions (Signed)
Description   Continue taking same dosage 1 tablet every day. Recheck in 6 weeks. Call us any new medication change # [phone].      

## 2018-03-22 ENCOUNTER — Other Ambulatory Visit: Payer: Self-pay | Admitting: Cardiology

## 2018-03-25 ENCOUNTER — Other Ambulatory Visit: Payer: Self-pay

## 2018-03-25 ENCOUNTER — Ambulatory Visit (HOSPITAL_COMMUNITY)
Admission: RE | Admit: 2018-03-25 | Discharge: 2018-03-25 | Disposition: A | Discharge status: Home / Self Care | Payer: Medicare Other | Source: Ambulatory Visit | Attending: Cardiology | Admitting: Cardiology

## 2018-03-25 VITALS — BP 129/83 | HR 80 | Wt 169.2 lb

## 2018-03-25 DIAGNOSIS — E039 Hypothyroidism, unspecified: Secondary | ICD-10-CM | POA: Diagnosis not present

## 2018-03-25 DIAGNOSIS — Z79899 Other long term (current) drug therapy: Secondary | ICD-10-CM | POA: Insufficient documentation

## 2018-03-25 DIAGNOSIS — I071 Rheumatic tricuspid insufficiency: Secondary | ICD-10-CM | POA: Diagnosis not present

## 2018-03-25 DIAGNOSIS — K219 Gastro-esophageal reflux disease without esophagitis: Secondary | ICD-10-CM | POA: Diagnosis not present

## 2018-03-25 DIAGNOSIS — E785 Hyperlipidemia, unspecified: Secondary | ICD-10-CM | POA: Diagnosis not present

## 2018-03-25 DIAGNOSIS — I130 Hypertensive heart and chronic kidney disease with heart failure and stage 1 through stage 4 chronic kidney disease, or unspecified chronic kidney disease: Secondary | ICD-10-CM | POA: Insufficient documentation

## 2018-03-25 DIAGNOSIS — Z7901 Long term (current) use of anticoagulants: Secondary | ICD-10-CM | POA: Diagnosis not present

## 2018-03-25 DIAGNOSIS — F0390 Unspecified dementia without behavioral disturbance: Secondary | ICD-10-CM | POA: Insufficient documentation

## 2018-03-25 DIAGNOSIS — I272 Pulmonary hypertension, unspecified: Secondary | ICD-10-CM | POA: Diagnosis present

## 2018-03-25 DIAGNOSIS — I482 Chronic atrial fibrillation, unspecified: Secondary | ICD-10-CM

## 2018-03-25 DIAGNOSIS — I5032 Chronic diastolic (congestive) heart failure: Secondary | ICD-10-CM | POA: Diagnosis present

## 2018-03-25 DIAGNOSIS — N183 Chronic kidney disease, stage 3 (moderate): Secondary | ICD-10-CM | POA: Insufficient documentation

## 2018-03-25 LAB — BASIC METABOLIC PANEL
ANION GAP: 8 (ref 5–15)
BUN: 20 mg/dL (ref 6–20)
CHLORIDE: 103 mmol/L (ref 101–111)
CO2: 30 mmol/L (ref 22–32)
Calcium: 10.2 mg/dL (ref 8.9–10.3)
Creatinine, Ser: 1.34 mg/dL — ABNORMAL HIGH (ref 0.44–1.00)
GFR, EST AFRICAN AMERICAN: 41 mL/min — AB (ref >60)
GFR, EST NON AFRICAN AMERICAN: 35 mL/min — AB (ref >60)
Glucose, Bld: 103 mg/dL — ABNORMAL HIGH (ref 65–99)
POTASSIUM: 3.8 mmol/L (ref 3.5–5.1)
SODIUM: 141 mmol/L (ref 135–145)

## 2018-03-25 NOTE — Patient Instructions (Signed)
Labs drawn today (if we do not call you, then your lab work was stable)   Your physician recommends that you schedule a follow-up appointment in: 6 months (September 2019) with Dr. Aundra Dubin

## 2018-03-28 NOTE — Progress Notes (Signed)
PCP: Dr. Sandi Mariscal Cardiology: Dr. Radford Pax HF Cardiology: Dr. Aundra Dubin  84 yo with history of permanent atrial fibrillation, chronic diastolic CHF, and CKD stage 3 was referred by Dr. Radford Pax for evaluation of CHF/RV failure and pulmonary hypertension.  Her last echo in 3/18 showed normal LV systolic function but D-shaped septum suggestive of RV pressure/volume overload, severe TR, and PASP 46 mmHg.  Looking back, she has had severe central tricuspid regurgitation since at least 2013.    She returns for followup of atrial fibrillation and diastolic CHF.  She has mild dementia and history is somewhat disjointed.  She just finished oseltamivir + azithromycin for a febrile illness.  She feels like she is getting better, cough is improving.  No dyspnea walking on flat ground though not particularly active.  She does all her ADLs without problems.  No chest pain.  No lightheadedness or falls. Weight down 1 lb.  She did not take the spironolactone that was prescribed at last appt.   Labs (1/19): K 4.3, creatinine 1.55, pro-BNP 3754 Labs (3/19): K 3.8, creatinine 1.72  PMH: 1. GERD 2. HTN 3. Hyperlipidemia 4. Atrial fibrillation: permanent. 5. Hypothyroidism 6. CKD stage 3 7. Chronic diastolic CHF:  - Echo (8/11): EF 55-60%, mild RV dilation with normal systolic function, D-shaped interventricular septum suggestive of RV pressure/volume overload, severe central TR, PASP 46 mmHg.  8. Tricuspid regurgitation: Noted to be severe since 2013.  9. Pulmonary hypertension: By echo.   - V/Q scan negative for chronic PE. - ANA, RF negative.  10. Cardiolite (4/18): No perfusion defects.   Social History   Socioeconomic History  . Marital status: Widowed    Spouse name: Not on file  . Number of children: 7  . Years of education: Not on file  . Highest education level: Not on file  Occupational History  . Occupation: Retired    Comment: Production assistant, radio  Social Needs  . Financial resource strain: Not on file   . Food insecurity:    Worry: Not on file    Inability: Not on file  . Transportation needs:    Medical: Not on file    Non-medical: Not on file  Tobacco Use  . Smoking status: Never Smoker  . Smokeless tobacco: Never Used  Substance and Sexual Activity  . Alcohol use: No  . Drug use: No  . Sexual activity: Not on file  Lifestyle  . Physical activity:    Days per week: Not on file    Minutes per session: Not on file  . Stress: Not on file  Relationships  . Social connections:    Talks on phone: Not on file    Gets together: Not on file    Attends religious service: Not on file    Active member of club or organization: Not on file    Attends meetings of clubs or organizations: Not on file    Relationship status: Not on file  . Intimate partner violence:    Fear of current or ex partner: Not on file    Emotionally abused: Not on file    Physically abused: Not on file    Forced sexual activity: Not on file  Other Topics Concern  . Not on file  Social History Narrative  . Not on file   Family History  Problem Relation Age of Onset  . Hypertension Father   . Peripheral vascular disease Father   . Diabetes Mother   . Heart disease Brother 60  . Liver  cancer Sister    ROS: All systems reviewed and negative except as per HPI.  Current Outpatient Medications  Medication Sig Dispense Refill  . Cholecalciferol (VITAMIN D3) 2000 units TABS Take 2,000 Units by mouth daily.    Marland Kitchen COUMADIN 2 MG tablet TAKE AS DIRECTED BY COUMADIN CLINIC 35 tablet 3  . Cyanocobalamin (VITAMIN B-12 PO) Take 1 tablet by mouth daily.    . furosemide (LASIX) 40 MG tablet Take 1 tablet (40mg )  in AM and 1/2 tab (20mg ) in PM 45 tablet 3  . irbesartan (AVAPRO) 150 MG tablet Take 150 mg by mouth daily.    Marland Kitchen LORazepam (ATIVAN) 1 MG tablet Take 1 mg by mouth 2 (two) times daily as needed (nervousness).     . metoprolol succinate (TOPROL XL) 100 MG 24 hr tablet Take 1 tablet (100 mg total) by mouth daily.  90 tablet 2  . pantoprazole (PROTONIX) 40 MG tablet Take 40 mg by mouth daily as needed (heartburn or acid reflux).     . SYNTHROID 50 MCG tablet Take 1 tablet by mouth daily OES  8  . triamcinolone cream (KENALOG) 0.1 % APPLY ONE APPLICATION TO RASH TWICE DAILY AS NEEDED. Use as directed  1   No current facility-administered medications for this encounter.    BP 129/83   Pulse 80   Wt 169 lb 4 oz (76.8 kg)   SpO2 98%   BMI 27.32 kg/m  General: NAD Neck: JVP 8 cm, no thyromegaly or thyroid nodule.  Lungs: Clear to auscultation bilaterally with normal respiratory effort. CV: Nondisplaced PMI.  Heart irregular S1/S2, no S3/S4, 2/6 HSM LLSB.  No peripheral edema.  No carotid bruit.  Normal pedal pulses.  Abdomen: Soft, nontender, no hepatosplenomegaly, no distention.  Skin: Intact without lesions or rashes.  Neurologic: Alert and oriented x 3.  Psych: Normal affect. Extremities: No clubbing or cyanosis.  HEENT: Normal.    Assessment/Plan: 1. Chronic diastolic CHF: With suspected RV failure in setting of severe TR.  NYHA class II symptoms currently. She appears reasonably well-compensated today.   - Continue Lasix 40 qam/20 qpm.  BMET today.  2. RV failure with severe TR: On echo, RV is mildly dilated with D-shaped septum, but RV systolic function is reasonably preserved.  She does have severe central TR and mild pulmonary hypertension by echo.  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  - As the [facility] is most likely group 2 and given her stated preference for noninvasive management, would hold off on RHC.  Treatment for now will be effective diuresis.  - She has chronic severe TR but is fairly well compensated (only mild volume overload that likely can be treated with increased diuresis). Given her age and modest results from surgical repair of isolated TR, would not evaluate for surgical repair.  3.  Atrial fibrillation: Permanent.  She is on warfarin and Toprol XL for rate control.  4. CKD: Stage 3.  Follow closely with diuresis. BMET today.   Followup in 6 months. Loralie Champagne 03/28/2018

## 2018-04-07 ENCOUNTER — Ambulatory Visit: Payer: Medicare Other | Admitting: Cardiology

## 2018-04-20 ENCOUNTER — Ambulatory Visit (INDEPENDENT_AMBULATORY_CARE_PROVIDER_SITE_OTHER): Payer: Medicare Other | Admitting: Pharmacist

## 2018-04-20 DIAGNOSIS — I4891 Unspecified atrial fibrillation: Secondary | ICD-10-CM | POA: Diagnosis not present

## 2018-04-20 DIAGNOSIS — Z7901 Long term (current) use of anticoagulants: Secondary | ICD-10-CM | POA: Diagnosis not present

## 2018-04-20 DIAGNOSIS — Z5181 Encounter for therapeutic drug level monitoring: Secondary | ICD-10-CM | POA: Diagnosis not present

## 2018-04-20 DIAGNOSIS — I482 Chronic atrial fibrillation: Secondary | ICD-10-CM

## 2018-04-20 DIAGNOSIS — I4821 Permanent atrial fibrillation: Secondary | ICD-10-CM

## 2018-04-20 LAB — POCT INR: INR: 2

## 2018-04-20 NOTE — Patient Instructions (Signed)
Description   Continue taking same dosage 1 tablet every day. Recheck in 6 weeks. Call us any new medication change # [phone].

## 2018-04-28 ENCOUNTER — Other Ambulatory Visit (HOSPITAL_COMMUNITY): Payer: Self-pay | Admitting: *Deleted

## 2018-04-28 MED ORDER — FUROSEMIDE 40 MG PO TABS: ORAL_TABLET | ORAL | 3 refills | Status: DC

## 2018-05-06 IMAGING — NM NM PULMONARY VENT & PERF
18 series · 18 of 18 positions shown · non-contrast
Comparison: None

CLINICAL DATA: Pulmonary hypertension.

EXAM:
NUCLEAR MEDICINE VENTILATION - PERFUSION LUNG SCAN
TECHNIQUE: Ventilation images were obtained in multiple projections using
inhaled aerosol 1c-UUm DTPA. Perfusion images were obtained in
multiple projections after intravenous injection of 1c-UUm MAA.
RADIOPHARMACEUTICALS:  32.0 mCi 8echnetium-77m DTPA aerosol
inhalation and 4.25 mCi 8echnetium-77m MAA IV

[Series 1: ant/post vent · 4.14mm/px · 1 of 1 slices shown (1 of 4)]
[im 1/1]
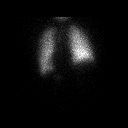

[Series 1: ant/post vent · 4.14mm/px · 1 of 1 slices shown (2 of 4)]
[im 1/1]
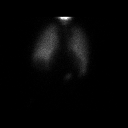

[Series 2: ant/post vent · 4.14mm/px · 1 of 1 slices shown (3 of 4)]
[im 1/1]
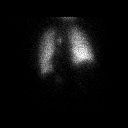

[Series 2: ant/post vent · 4.14mm/px · 1 of 1 slices shown (4 of 4)]
[im 1/1]
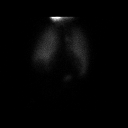

[Series 3: lao/rpo vent · 4.14mm/px · 1 of 1 slices shown (1 of 2)]
[im 1/1]
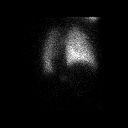

[Series 3: lao/rpo vent · 4.14mm/px · 1 of 1 slices shown (2 of 2)]
[im 1/1]
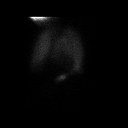

[Series 4: lpo/rao vent · 4.14mm/px · 1 of 1 slices shown (1 of 2)]
[im 1/1]
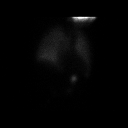

[Series 4: lpo/rao vent · 4.14mm/px · 1 of 1 slices shown (2 of 2)]
[im 1/1]
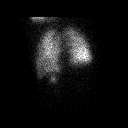

[Series 5: lt lat/rt lat vent · 4.14mm/px · 1 of 1 slices shown (1 of 2)]
[im 1/1]
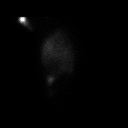

[Series 5: lt lat/rt lat vent · 4.14mm/px · 1 of 1 slices shown (2 of 2)]
[im 1/1]
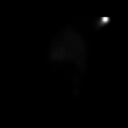

[Series 6: lt lat/rt lat perf · 4.14mm/px · 1 of 1 slices shown (1 of 2)]
[im 1/1]
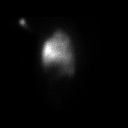

[Series 6: lt lat/rt lat perf · 4.14mm/px · 1 of 1 slices shown (2 of 2)]
[im 1/1]
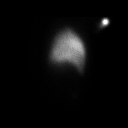

[Series 7: lpo/rao perf · 4.14mm/px · 1 of 1 slices shown (1 of 2)]
[im 1/1]
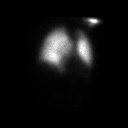

[Series 7: lpo/rao perf · 4.14mm/px · 1 of 1 slices shown (2 of 2)]
[im 1/1]
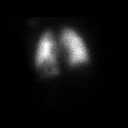

[Series 8: ant/post perf · 4.14mm/px · 1 of 1 slices shown (1 of 2)]
[im 1/1]
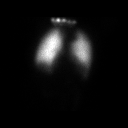

[Series 8: ant/post perf · 4.14mm/px · 1 of 1 slices shown (2 of 2)]
[im 1/1]
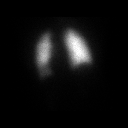

[Series 9: lao/rpo perf · 4.14mm/px · 1 of 1 slices shown (1 of 2)]
[im 1/1]
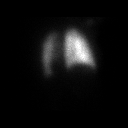

[Series 9: lao/rpo perf · 4.14mm/px · 1 of 1 slices shown (2 of 2)]
[im 1/1]
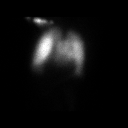

[18 of 18 positions shown; findings below may reference images not displayed]

FINDINGS: Ventilation: No focal ventilation defect.

Perfusion: No wedge shaped peripheral perfusion defects to suggest
acute pulmonary embolism.
IMPRESSION: No evidence for acute or chronic pulmonary embolus to explain
patient's pulmonary hypertension. Normal exam.

## 2018-05-06 IMAGING — DX DG CHEST 2V
2 series · 2 of 2 positions shown · non-contrast
Comparison: 03/31/2017

CLINICAL DATA: Pulmonary hypertension.  Shortness of breath.

EXAM:
CHEST  2 VIEW

[w chest pa]
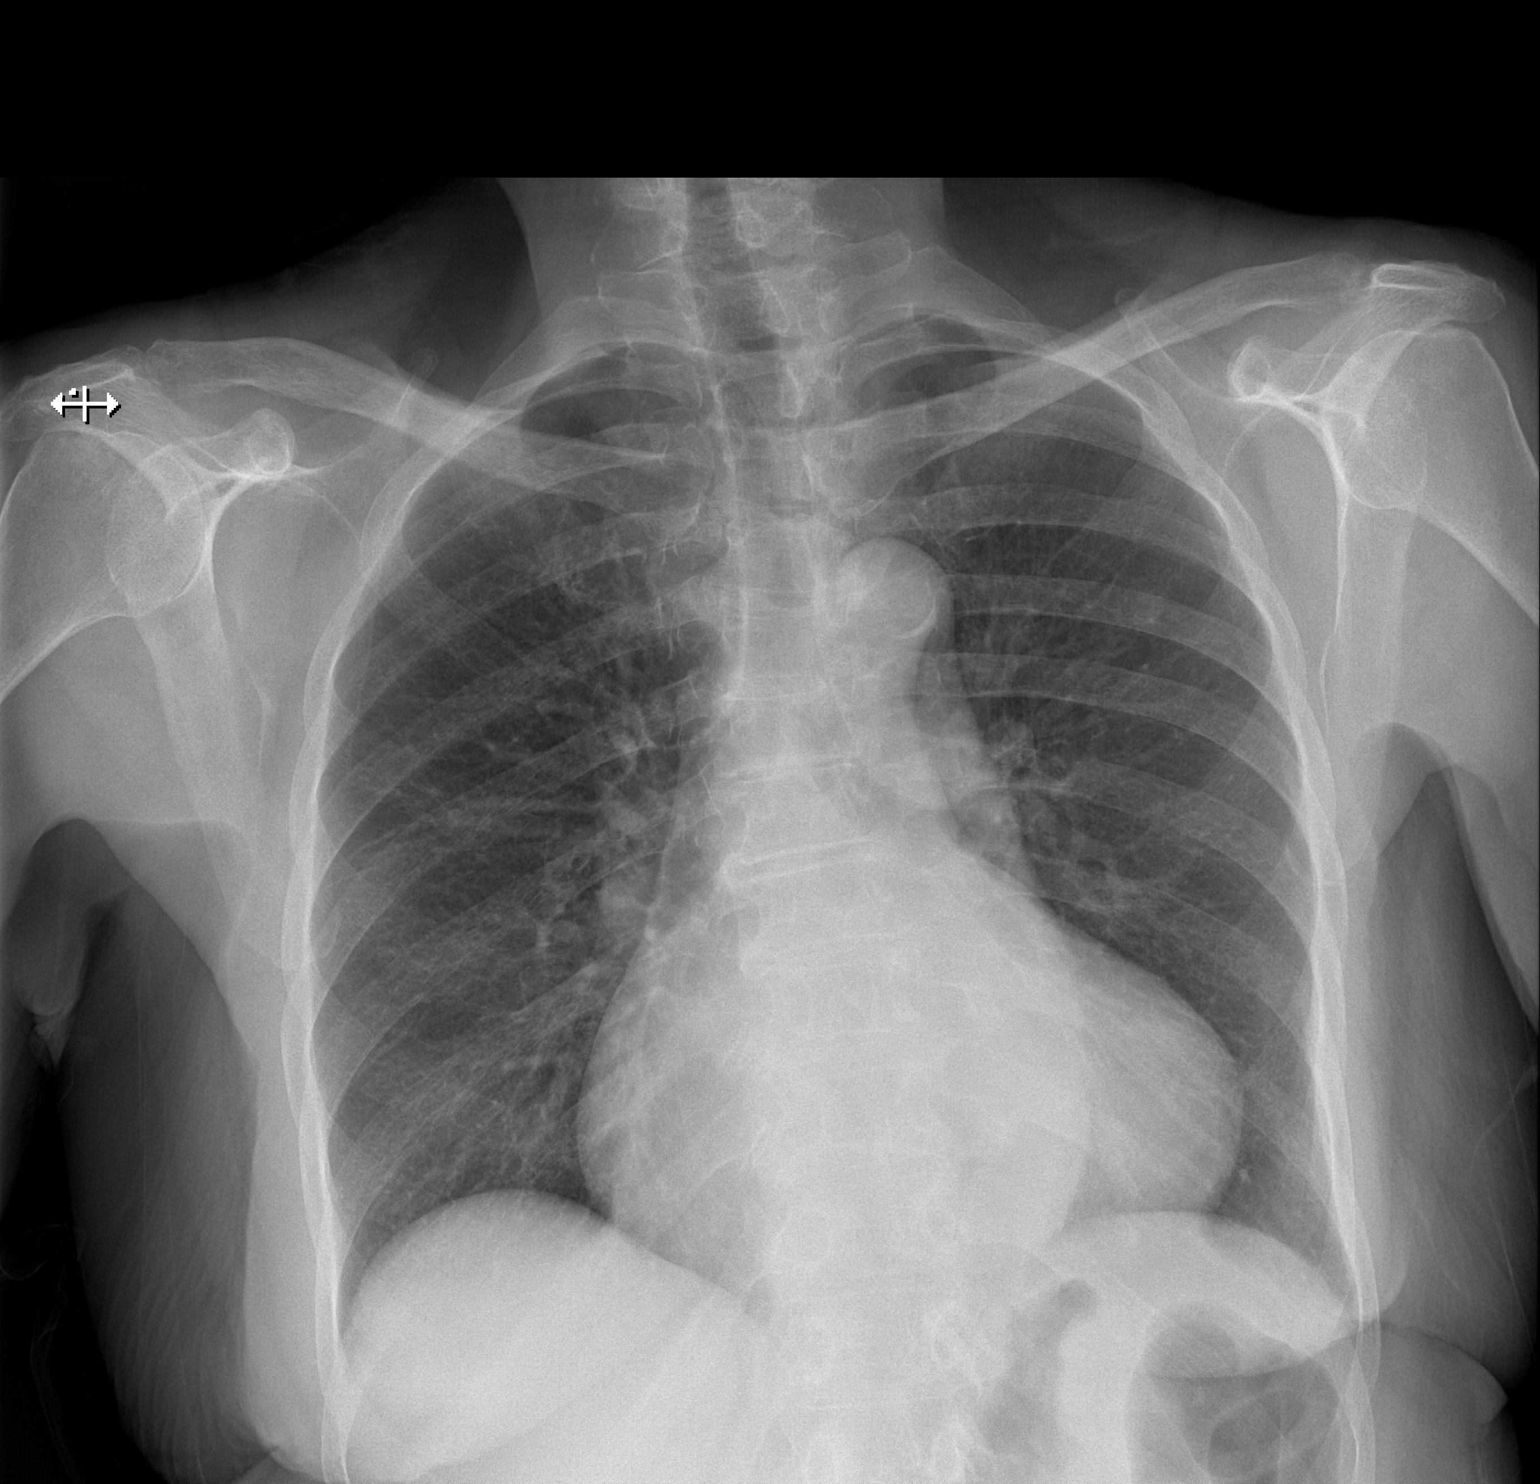

[w chest lat]
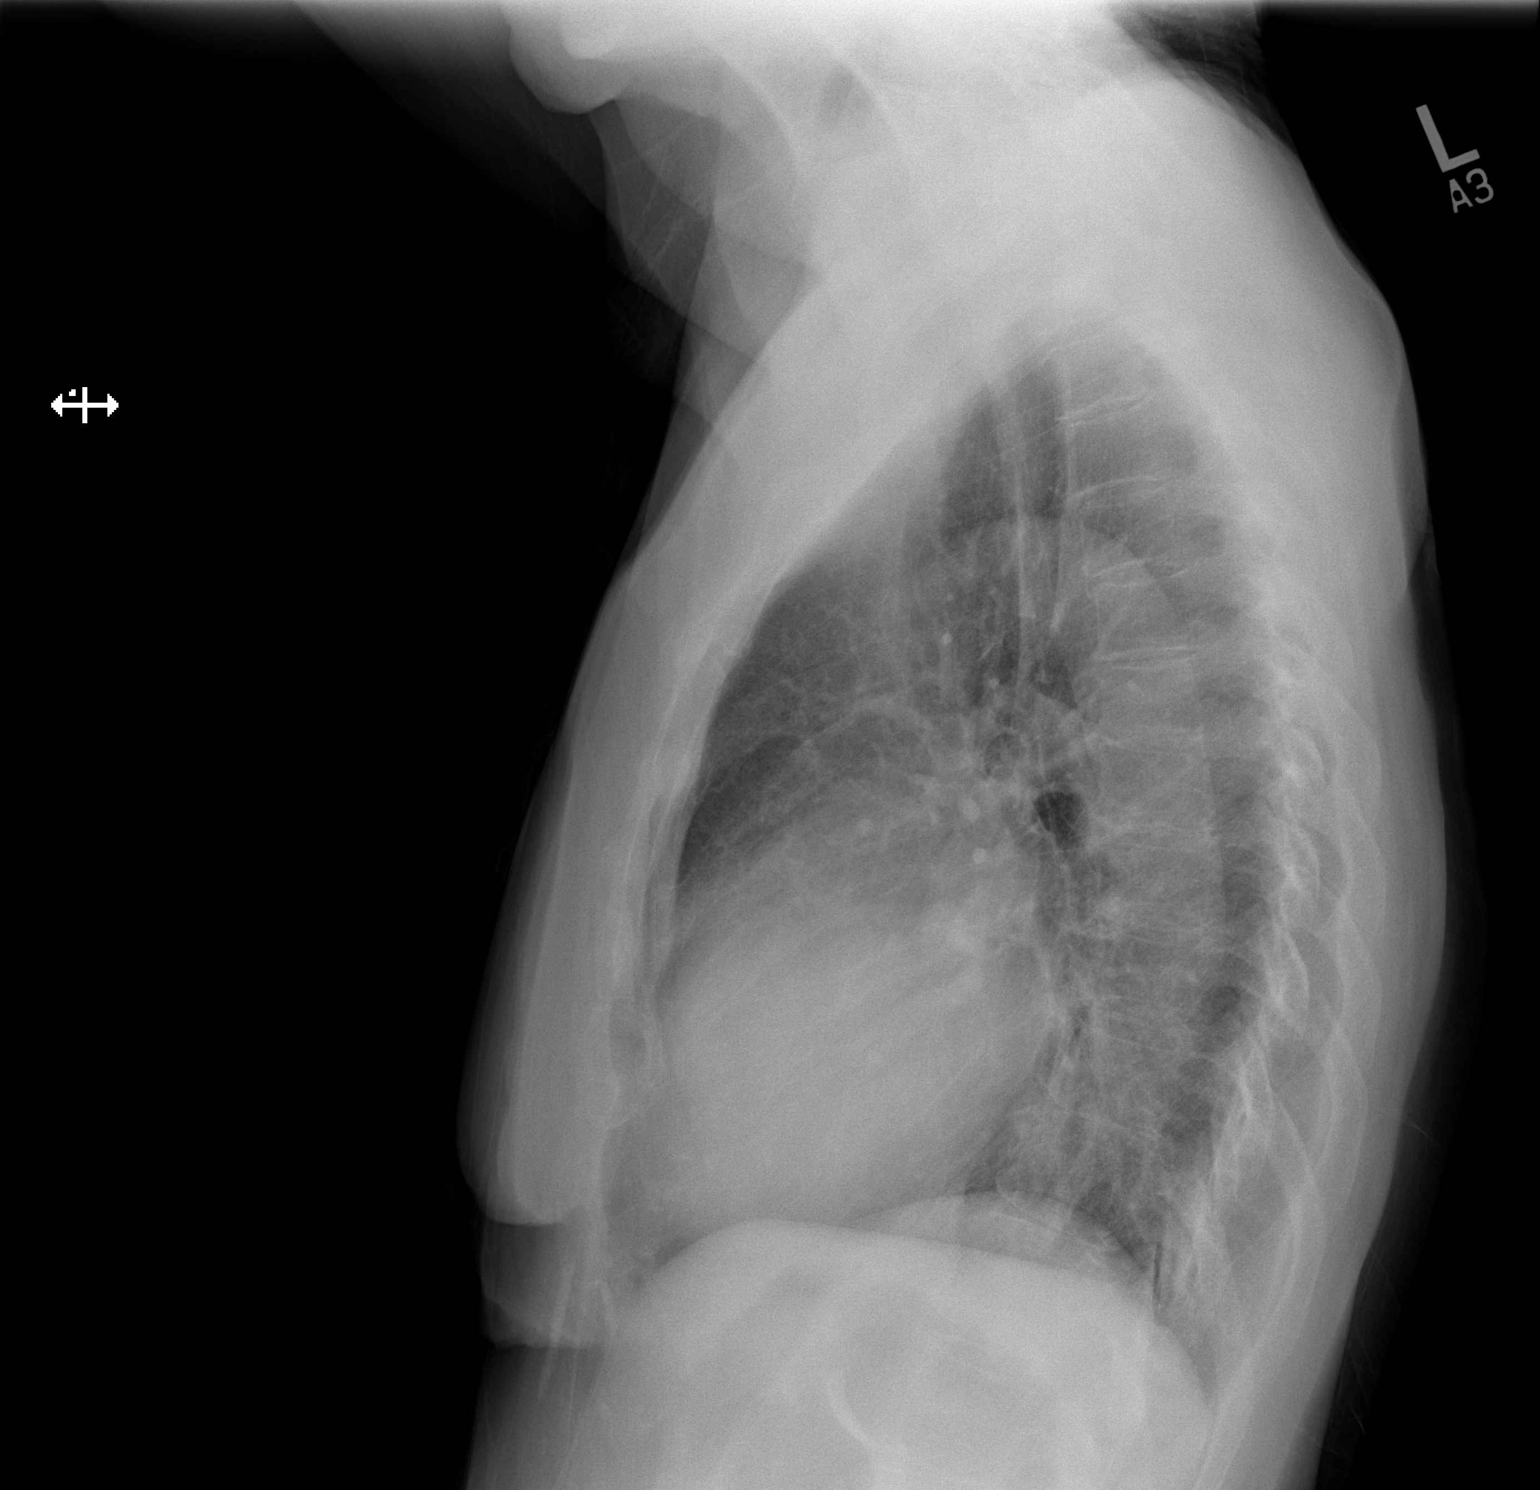

[2 of 2 positions shown; findings below may reference images not displayed]

FINDINGS: The cardiac silhouette remains mildly enlarged. Tortuosity and
atherosclerotic calcification are again seen involving the thoracic
aorta. Mild central pulmonary vascular prominence on the prior study
has decreased. The lungs are well inflated and clear. No pleural
effusion or pneumothorax is identified. No acute osseous abnormality
is seen.
IMPRESSION: 1. No evidence of active cardiopulmonary disease.
2. Unchanged cardiomegaly.
3. Aortic atherosclerosis.

## 2018-05-20 ENCOUNTER — Telehealth: Payer: Self-pay | Admitting: Cardiology

## 2018-05-20 NOTE — Telephone Encounter (Signed)
New Message   Judeen Hammans at Dr. Raechel Ache office is calling because the pt seen Dr. Aundra Dubin in March and they want to know if the pt still needs the appt with Dr. Radford Pax or can it be cancelled. Please call

## 2018-05-20 NOTE — Telephone Encounter (Signed)
I spoke with Judeen Hammans. I informed her that patient should keep her yearly f/u appt with Dr. Radford Pax on 06/21/18. I also explained that Dr. Aundra Dubin is managing her CHF and Dr. Radford Pax is her general cardiologist. She verbalized understanding and thankful for the call.

## 2018-06-01 ENCOUNTER — Encounter (INDEPENDENT_AMBULATORY_CARE_PROVIDER_SITE_OTHER): Payer: Self-pay

## 2018-06-01 ENCOUNTER — Ambulatory Visit (INDEPENDENT_AMBULATORY_CARE_PROVIDER_SITE_OTHER): Payer: Medicare Other

## 2018-06-01 DIAGNOSIS — Z7901 Long term (current) use of anticoagulants: Secondary | ICD-10-CM

## 2018-06-01 DIAGNOSIS — I482 Chronic atrial fibrillation: Secondary | ICD-10-CM | POA: Diagnosis not present

## 2018-06-01 DIAGNOSIS — I4891 Unspecified atrial fibrillation: Secondary | ICD-10-CM

## 2018-06-01 DIAGNOSIS — Z5181 Encounter for therapeutic drug level monitoring: Secondary | ICD-10-CM | POA: Diagnosis not present

## 2018-06-01 DIAGNOSIS — I4821 Permanent atrial fibrillation: Secondary | ICD-10-CM

## 2018-06-01 LAB — POCT INR: INR: 2.7 (ref 2.0–3.0)

## 2018-06-01 NOTE — Patient Instructions (Signed)
Description   Continue taking same dosage 1 tablet every day. Recheck in 6 weeks. Call us any new medication change # [phone].

## 2018-06-21 ENCOUNTER — Encounter: Payer: Self-pay | Admitting: Cardiology

## 2018-06-21 ENCOUNTER — Ambulatory Visit (INDEPENDENT_AMBULATORY_CARE_PROVIDER_SITE_OTHER): Payer: Medicare Other | Admitting: Cardiology

## 2018-06-21 VITALS — BP 126/78 | HR 83 | Ht 66.0 in | Wt 168.0 lb

## 2018-06-21 DIAGNOSIS — I482 Chronic atrial fibrillation: Secondary | ICD-10-CM

## 2018-06-21 DIAGNOSIS — I272 Pulmonary hypertension, unspecified: Secondary | ICD-10-CM

## 2018-06-21 DIAGNOSIS — I5032 Chronic diastolic (congestive) heart failure: Secondary | ICD-10-CM | POA: Diagnosis not present

## 2018-06-21 DIAGNOSIS — I4821 Permanent atrial fibrillation: Secondary | ICD-10-CM

## 2018-06-21 DIAGNOSIS — I10 Essential (primary) hypertension: Secondary | ICD-10-CM | POA: Diagnosis not present

## 2018-06-21 DIAGNOSIS — I071 Rheumatic tricuspid insufficiency: Secondary | ICD-10-CM

## 2018-06-21 NOTE — Patient Instructions (Signed)
Medication Instructions:  Your physician recommends that you continue on your current medications as directed. Please refer to the Current Medication list given to you today.  If you need a refill on your cardiac medications, please contact your pharmacy first.  Labwork: None ordered   Testing/Procedures: Your physician has requested that you have an echocardiogram. Echocardiography is a painless test that uses sound waves to create images of your heart. It provides your doctor with information about the size and shape of your heart and how well your heart's chambers and valves are working. This procedure takes approximately one hour. There are no restrictions for this procedure.  Follow-Up: Your physician wants you to follow-up in: 6 months with Dr. Radford Pax. You will receive a reminder letter in the mail two months in advance. If you don't receive a letter, please call our office to schedule the follow-up appointment.  Any Other Special Instructions Will Be Listed Below (If Applicable).   Thank you for choosing [facility], RN  [phone]  If you need a refill on your cardiac medications before your next appointment, please call your pharmacy.

## 2018-06-21 NOTE — Progress Notes (Signed)
Cardiology Office Note:    Date:  06/21/2018   ID:  Lori Bridges, DOB 10/13/1930, MRN [id number]  PCP:  Lori Late, MD  Cardiologist:  No primary care provider on file.    Referring MD: Lori Late, MD   Chief Complaint  Patient presents with  . Atrial Fibrillation  . Congestive Heart Failure  . Hypertension    History of Present Illness:    Lori Bridges is a 84 y.o. female with a hx of GERD, HTN, hyperlipidemia, permanent atrial fibrillation on chronic coumadin and chronic diastolic CHF. She has a history of severe TR by echo with RV volume overload with mild RVE and moderate pulmonary HTN with PASP 73mmHg. This dates back to 2013 by echo. 2D echo showed normal LVF with moderate PHTN with PASP 42mmHg.  It was felt that her LE edema was secondary to diastolic dysfunction, dietary indiscretion with sodium, RHF and cardizem.  Her Cardizem was changed to Toprol.  Nuclear stress test was low risk.  Initial workup for PHTN included ANA, sed rate and RF which were normal.  Sleep study was ordered but patient cancelled and PFTs were incomplete.  Shee is here today for followup and is doing well.  She denies any chest pain or pressure separate and occasional sharp stabbing pain in the midsternal area.  This is very rare.  She does have some mild dyspnea on exertion rarely but is not a major complaint today.  She denies any PND, orthopnea,  palpitations or syncope.  She has problems with chronic vertigo so does have dizziness on occasion.  She says she will intermittently have lower extremity edema but is fairly well controlled with diuretics.  She is compliant with her meds and is tolerating meds with no SE.    Past Medical History:  Diagnosis Date  . Adenomatous colon polyp 2005  . Anxiety age 72  . Carotid artery bruit 2001   left  . DDD (degenerative disc disease)   . DJD (degenerative joint disease)   . Gastroenteritis 2011  . GERD (gastroesophageal reflux disease) 2003  .  History of pneumonia   . HTN (hypertension) 1983  . Hyperlipidemia 2000  . Hypothyroidism 2009  . Iron deficiency anemia 1983  . Osteoarthritis 2001  . Osteoporosis 2001  . Persistent atrial fibrillation ([place]) 07/23/2010       . Persistent headaches 1987  . Pulmonary HTN ([place]) 04/03/2017   Moderate with PASP 22mmHg by echo 02/2017  . Tricuspid regurgitation    severe by echo 02/25/12  . Vertigo   . Vitamin D deficiency 2011    Past Surgical History:  Procedure Laterality Date  . COLONOSCOPY W/ BIOPSIES AND POLYPECTOMY  09/16/2004   adenomatous polyp, diverticulosis, internal hemorrhoids  . s/p BLT    . TUBAL LIGATION      Current Medications: Current Meds  Medication Sig  . Cholecalciferol (VITAMIN D3) 2000 units TABS Take 2,000 Units by mouth daily.  Marland Kitchen COUMADIN 2 MG tablet TAKE AS DIRECTED BY COUMADIN CLINIC  . Cyanocobalamin (VITAMIN B-12 PO) Take 1 tablet by mouth daily.  . furosemide (LASIX) 40 MG tablet Take 1 tablet (40mg )  in AM and 1/2 tab (20mg ) in PM  . irbesartan (AVAPRO) 150 MG tablet Take 150 mg by mouth daily.  Marland Kitchen LORazepam (ATIVAN) 1 MG tablet Take 1 mg by mouth 2 (two) times daily as needed (nervousness).   . metoprolol succinate (TOPROL XL) 100 MG 24 hr tablet Take 1 tablet (100 mg  total) by mouth daily.  . pantoprazole (PROTONIX) 40 MG tablet Take 40 mg by mouth daily as needed (heartburn or acid reflux).   . SYNTHROID 50 MCG tablet Take 1 tablet by mouth daily OES  . triamcinolone cream (KENALOG) 0.1 % APPLY ONE APPLICATION TO RASH TWICE DAILY AS NEEDED. Use as directed     Allergies:   Meclizine; Pravastatin; Zetia [ezetimibe]; Sulfamethoxazole; Ace inhibitors; Codeine; Colestipol hcl; Doxycycline; Lovastatin; and Sulfa antibiotics   Social History   Socioeconomic History  . Marital status: Widowed    Spouse name: Not on file  . Number of children: 7  . Years of education: Not on file  . Highest education level: Not on file  Occupational History  .  Occupation: Retired    Comment: Production assistant, radio  Social Needs  . Financial resource strain: Not on file  . Food insecurity:    Worry: Not on file    Inability: Not on file  . Transportation needs:    Medical: Not on file    Non-medical: Not on file  Tobacco Use  . Smoking status: Never Smoker  . Smokeless tobacco: Never Used  Substance and Sexual Activity  . Alcohol use: No  . Drug use: No  . Sexual activity: Not on file  Lifestyle  . Physical activity:    Days per week: Not on file    Minutes per session: Not on file  . Stress: Not on file  Relationships  . Social connections:    Talks on phone: Not on file    Gets together: Not on file    Attends religious service: Not on file    Active member of club or organization: Not on file    Attends meetings of clubs or organizations: Not on file    Relationship status: Not on file  Other Topics Concern  . Not on file  Social History Narrative  . Not on file     Family History: The patient's family history includes Diabetes in her mother; Heart disease (age of onset: 19) in her brother; Hypertension in her father; Liver cancer in her sister; Peripheral vascular disease in her father.  ROS:   Please see the history of present illness.    ROS  All other systems reviewed and negative.   EKGs/Labs/Other Studies Reviewed:    The following studies were reviewed today: none  EKG:  EKG is not ordered today.    Recent Labs: 01/26/2018: NT-Pro BNP 3,754 03/25/2018: BUN 20; Creatinine, Ser 1.34; Potassium 3.8; Sodium 141   Recent Lipid Panel No results found for: CHOL, TRIG, HDL, CHOLHDL, VLDL, LDLCALC, LDLDIRECT  Physical Exam:    VS:  BP 126/78   Pulse 83   Ht 5\' 6"  (1.676 m)   Wt 168 lb (76.2 kg)   SpO2 92%   BMI 27.12 kg/m     Wt Readings from Last 3 Encounters:  06/21/18 168 lb (76.2 kg)  03/25/18 169 lb 4 oz (76.8 kg)  02/22/18 170 lb 9.6 oz (77.4 kg)     GEN:  Well nourished, well developed in no acute  distress HEENT: Normal NECK: No JVD; No carotid bruits LYMPHATICS: No lymphadenopathy CARDIAC: RRR, no murmurs, rubs, gallops RESPIRATORY:  Clear to auscultation without rales, wheezing or rhonchi  ABDOMEN: Soft, non-tender, non-distended MUSCULOSKELETAL:  No edema; No deformity  SKIN: Warm and dry NEUROLOGIC:  Alert and oriented x 3 PSYCHIATRIC:  Normal affect   ASSESSMENT:    1. Permanent atrial fibrillation ([place])  2. Chronic diastolic CHF (congestive heart failure) ([place])   3. HYPERTENSION, MILD   4. Tricuspid valve insufficiency, unspecified etiology   5. Pulmonary HTN ([place])    PLAN:    In order of problems listed above:  1. Permanent atrial fibrillation - Her HR is well controlled on exam today.  She will continue on Toprol-XL 100 mg daily and Coumadin.  2.  Chronic diastolic CHF and RV failure secondary to severe TR.  She is NYHA class II symptoms.  She appears euvolemic on exam today.  Her weight is stable.  She will have some very mild lower extremity edema on occasion but there is none on exam today.  She will continue on Lasix 40 mg every morning and 20 mg every afternoon.  Her creatinine was stable at 1.34 on 03/25/2018.  3.  HTN -blood pressures well controlled on exam today.  She will continue on irbesartan 150 mg daily and Toprol XL 100 mg daily.  Creatinine was stable at 1.34 on 03/25/2018 and potassium was stable at 3.8.  4.  RV failure with severe TR -severe by echo 03/16/2017 due to annular dilatation from dilated RV.  Echo showed mildly dilated RV with preserved RV function.  Given advanced age isolated TR repair surgically is not recommended.  This has been stable with diuretic therapy.  5.  Pulmonary HTN - moderate pulmonary hypertension with PASP 46 mmHg by echo 03/16/2017.  Likely group 2 PH due to chronic diastolic heart failure.  I will repeat echo to make sure this is not progressed.  Continue diuretics.   Medication Adjustments/Labs and Tests  Ordered: Current medicines are reviewed at length with the patient today.  Concerns regarding medicines are outlined above.  No orders of the defined types were placed in this encounter.  No orders of the defined types were placed in this encounter.   Signed, Fransico Him, MD  06/21/2018 12:56 PM    [place]

## 2018-06-25 ENCOUNTER — Other Ambulatory Visit: Payer: Self-pay

## 2018-06-25 ENCOUNTER — Ambulatory Visit (HOSPITAL_COMMUNITY): Payer: Medicare Other | Attending: Internal Medicine

## 2018-06-25 DIAGNOSIS — Z8701 Personal history of pneumonia (recurrent): Secondary | ICD-10-CM | POA: Diagnosis not present

## 2018-06-25 DIAGNOSIS — E039 Hypothyroidism, unspecified: Secondary | ICD-10-CM | POA: Diagnosis not present

## 2018-06-25 DIAGNOSIS — I272 Pulmonary hypertension, unspecified: Secondary | ICD-10-CM

## 2018-06-25 DIAGNOSIS — I10 Essential (primary) hypertension: Secondary | ICD-10-CM | POA: Diagnosis not present

## 2018-06-25 DIAGNOSIS — E785 Hyperlipidemia, unspecified: Secondary | ICD-10-CM | POA: Diagnosis not present

## 2018-06-25 DIAGNOSIS — I481 Persistent atrial fibrillation: Secondary | ICD-10-CM | POA: Diagnosis not present

## 2018-06-25 DIAGNOSIS — R0989 Other specified symptoms and signs involving the circulatory and respiratory systems: Secondary | ICD-10-CM | POA: Diagnosis not present

## 2018-06-25 DIAGNOSIS — I083 Combined rheumatic disorders of mitral, aortic and tricuspid valves: Secondary | ICD-10-CM | POA: Insufficient documentation

## 2018-06-25 DIAGNOSIS — F419 Anxiety disorder, unspecified: Secondary | ICD-10-CM | POA: Diagnosis not present

## 2018-07-12 ENCOUNTER — Ambulatory Visit (INDEPENDENT_AMBULATORY_CARE_PROVIDER_SITE_OTHER): Payer: Medicare Other | Admitting: *Deleted

## 2018-07-12 DIAGNOSIS — I482 Chronic atrial fibrillation: Secondary | ICD-10-CM

## 2018-07-12 DIAGNOSIS — Z5181 Encounter for therapeutic drug level monitoring: Secondary | ICD-10-CM

## 2018-07-12 DIAGNOSIS — Z7901 Long term (current) use of anticoagulants: Secondary | ICD-10-CM | POA: Diagnosis not present

## 2018-07-12 DIAGNOSIS — I4891 Unspecified atrial fibrillation: Secondary | ICD-10-CM

## 2018-07-12 DIAGNOSIS — I4821 Permanent atrial fibrillation: Secondary | ICD-10-CM

## 2018-07-12 LAB — POCT INR: INR: 1.9 — AB (ref 2.0–3.0)

## 2018-07-12 NOTE — Patient Instructions (Signed)
Description   Today July 15th take 1 and 1/2 tablets (3mg ) then continue taking same dosage 1 tablet every day. Recheck in 6 weeks. Call us any new medication change # [phone].

## 2018-07-16 ENCOUNTER — Other Ambulatory Visit: Payer: Self-pay | Admitting: Family Medicine

## 2018-07-16 ENCOUNTER — Ambulatory Visit
Admission: RE | Admit: 2018-07-16 | Discharge: 2018-07-16 | Disposition: A | Discharge status: Home / Self Care | Payer: Medicare Other | Source: Ambulatory Visit | Attending: Family Medicine | Admitting: Family Medicine

## 2018-07-16 DIAGNOSIS — T1490XA Injury, unspecified, initial encounter: Secondary | ICD-10-CM

## 2018-07-26 ENCOUNTER — Other Ambulatory Visit: Payer: Self-pay | Admitting: Internal Medicine

## 2018-08-24 ENCOUNTER — Ambulatory Visit (INDEPENDENT_AMBULATORY_CARE_PROVIDER_SITE_OTHER): Payer: Medicare Other | Admitting: *Deleted

## 2018-08-24 DIAGNOSIS — I482 Chronic atrial fibrillation: Secondary | ICD-10-CM

## 2018-08-24 DIAGNOSIS — Z5181 Encounter for therapeutic drug level monitoring: Secondary | ICD-10-CM

## 2018-08-24 DIAGNOSIS — Z7901 Long term (current) use of anticoagulants: Secondary | ICD-10-CM | POA: Diagnosis not present

## 2018-08-24 DIAGNOSIS — I4891 Unspecified atrial fibrillation: Secondary | ICD-10-CM | POA: Diagnosis not present

## 2018-08-24 DIAGNOSIS — I4821 Permanent atrial fibrillation: Secondary | ICD-10-CM

## 2018-08-24 LAB — POCT INR: INR: 2.7 (ref 2.0–3.0)

## 2018-08-24 NOTE — Patient Instructions (Signed)
Description   Continue taking same dosage 1 tablet every day. Recheck in 6 weeks. Call us any new medication change # [phone].

## 2018-09-15 ENCOUNTER — Other Ambulatory Visit (HOSPITAL_COMMUNITY): Payer: Self-pay | Admitting: *Deleted

## 2018-09-15 ENCOUNTER — Other Ambulatory Visit: Payer: Self-pay

## 2018-09-15 MED ORDER — FUROSEMIDE 40 MG PO TABS: ORAL_TABLET | ORAL | 2 refills | Status: DC

## 2018-09-17 ENCOUNTER — Other Ambulatory Visit: Payer: Self-pay | Admitting: Family Medicine

## 2018-09-17 ENCOUNTER — Ambulatory Visit
Admission: RE | Admit: 2018-09-17 | Discharge: 2018-09-17 | Disposition: A | Discharge status: Home / Self Care | Payer: Medicare Other | Source: Ambulatory Visit | Attending: Family Medicine | Admitting: Family Medicine

## 2018-09-17 DIAGNOSIS — R0600 Dyspnea, unspecified: Secondary | ICD-10-CM

## 2018-10-05 ENCOUNTER — Ambulatory Visit (INDEPENDENT_AMBULATORY_CARE_PROVIDER_SITE_OTHER): Payer: Medicare Other | Admitting: *Deleted

## 2018-10-05 DIAGNOSIS — I4891 Unspecified atrial fibrillation: Secondary | ICD-10-CM

## 2018-10-05 DIAGNOSIS — I4821 Permanent atrial fibrillation: Secondary | ICD-10-CM

## 2018-10-05 DIAGNOSIS — Z7901 Long term (current) use of anticoagulants: Secondary | ICD-10-CM | POA: Diagnosis not present

## 2018-10-05 DIAGNOSIS — Z5181 Encounter for therapeutic drug level monitoring: Secondary | ICD-10-CM

## 2018-10-05 LAB — POCT INR: INR: 2.7 (ref 2.0–3.0)

## 2018-10-05 NOTE — Patient Instructions (Signed)
Description   Continue taking same dosage 1 tablet every day. Recheck in 6 weeks. Call us any new medication change # [phone].

## 2018-11-16 ENCOUNTER — Ambulatory Visit (INDEPENDENT_AMBULATORY_CARE_PROVIDER_SITE_OTHER): Payer: Medicare Other | Admitting: *Deleted

## 2018-11-16 DIAGNOSIS — I4821 Permanent atrial fibrillation: Secondary | ICD-10-CM | POA: Diagnosis not present

## 2018-11-16 DIAGNOSIS — Z7901 Long term (current) use of anticoagulants: Secondary | ICD-10-CM | POA: Diagnosis not present

## 2018-11-16 DIAGNOSIS — I4891 Unspecified atrial fibrillation: Secondary | ICD-10-CM | POA: Diagnosis not present

## 2018-11-16 DIAGNOSIS — Z5181 Encounter for therapeutic drug level monitoring: Secondary | ICD-10-CM | POA: Diagnosis not present

## 2018-11-16 LAB — POCT INR: INR: 2.2 (ref 2.0–3.0)

## 2018-11-16 NOTE — Patient Instructions (Signed)
Description   Continue taking same dosage 1 tablet every day. Recheck in 6 weeks. Call us any new medication change # [phone].

## 2018-12-12 ENCOUNTER — Other Ambulatory Visit: Payer: Self-pay | Admitting: Internal Medicine

## 2018-12-13 ENCOUNTER — Ambulatory Visit: Payer: Medicare Other | Admitting: Cardiology

## 2018-12-21 ENCOUNTER — Other Ambulatory Visit: Payer: Self-pay | Admitting: Cardiology

## 2018-12-24 ENCOUNTER — Ambulatory Visit
Admission: RE | Admit: 2018-12-24 | Discharge: 2018-12-24 | Disposition: A | Discharge status: Home / Self Care | Payer: Medicare Other | Source: Ambulatory Visit | Attending: Family Medicine | Admitting: Family Medicine

## 2018-12-24 ENCOUNTER — Other Ambulatory Visit: Payer: Self-pay | Admitting: Family Medicine

## 2018-12-24 DIAGNOSIS — M542 Cervicalgia: Secondary | ICD-10-CM

## 2018-12-28 ENCOUNTER — Ambulatory Visit (INDEPENDENT_AMBULATORY_CARE_PROVIDER_SITE_OTHER): Payer: Medicare Other | Admitting: Pharmacist

## 2018-12-28 DIAGNOSIS — Z7901 Long term (current) use of anticoagulants: Secondary | ICD-10-CM

## 2018-12-28 DIAGNOSIS — I4821 Permanent atrial fibrillation: Secondary | ICD-10-CM

## 2018-12-28 DIAGNOSIS — I4891 Unspecified atrial fibrillation: Secondary | ICD-10-CM

## 2018-12-28 DIAGNOSIS — Z5181 Encounter for therapeutic drug level monitoring: Secondary | ICD-10-CM | POA: Diagnosis not present

## 2018-12-28 LAB — POCT INR: INR: 2.4 (ref 2.0–3.0)

## 2018-12-28 NOTE — Patient Instructions (Signed)
Description   Continue taking same dosage 1 tablet every day. Recheck in 6 weeks. Call us any new medication change # [phone].

## 2019-01-06 ENCOUNTER — Ambulatory Visit: Payer: Medicare Other | Admitting: Cardiology

## 2019-01-17 ENCOUNTER — Encounter: Payer: Self-pay | Admitting: Cardiology

## 2019-01-17 ENCOUNTER — Ambulatory Visit (INDEPENDENT_AMBULATORY_CARE_PROVIDER_SITE_OTHER): Payer: Medicare Other | Admitting: Cardiology

## 2019-01-17 VITALS — BP 142/88 | HR 86 | Ht 66.0 in | Wt 167.8 lb

## 2019-01-17 DIAGNOSIS — I5032 Chronic diastolic (congestive) heart failure: Secondary | ICD-10-CM

## 2019-01-17 DIAGNOSIS — I071 Rheumatic tricuspid insufficiency: Secondary | ICD-10-CM | POA: Diagnosis not present

## 2019-01-17 DIAGNOSIS — I4821 Permanent atrial fibrillation: Secondary | ICD-10-CM | POA: Diagnosis not present

## 2019-01-17 DIAGNOSIS — I272 Pulmonary hypertension, unspecified: Secondary | ICD-10-CM

## 2019-01-17 DIAGNOSIS — I10 Essential (primary) hypertension: Secondary | ICD-10-CM

## 2019-01-17 NOTE — Progress Notes (Signed)
Cardiology Office Note:    Date:  01/17/2019   ID:  Lori Bridges, DOB 06/20/1930, MRN [id number]  PCP:  Derinda Late, MD  Cardiologist:  No primary care provider on file.    Referring MD: Derinda Late, MD   Chief Complaint  Patient presents with  . Atrial Fibrillation  . Congestive Heart Failure  . Hypertension    History of Present Illness:    Lori Bridges is a 85 y.o. female with a hx of  GERD, HTN, hyperlipidemia, permanent atrial fibrillation on chronic coumadinand chronic diastolicCHF. She has a history of severe TR by echo with RV volume overload with mild RVE and moderate pulmonary HTN with PASP 25mmHg. This dates back to 2013 by echo.2D echo showed normal LVF with moderate PHTN with PASP 2mmHg. It was felt that her LE edema was secondary to diastolic dysfunction, dietary indiscretion with sodium, RHF and cardizem. Her Cardizem was changed to Toprol. Nuclear stress test was low risk. Initial workup for PHTN included ANA, sed rate and RF which were normal. Sleep study was ordered but patient cancelled and PFTs were incomplete.  She is here today for followup and is doing well.  She denies any chest pain or pressure, SOB, DOE, PND, orthopnea, LE edema, dizziness, palpitations or syncope. She is compliant with her meds and is tolerating meds with no SE.    Past Medical History:  Diagnosis Date  . Adenomatous colon polyp 2005  . Anxiety age 35  . Carotid artery bruit 2001   left  . DDD (degenerative disc disease)   . DJD (degenerative joint disease)   . Gastroenteritis 2011  . GERD (gastroesophageal reflux disease) 2003  . History of pneumonia   . HTN (hypertension) 1983  . Hyperlipidemia 2000  . Hypothyroidism 2009  . Iron deficiency anemia 1983  . Osteoarthritis 2001  . Osteoporosis 2001  . Persistent atrial fibrillation 07/23/2010       . Persistent headaches 1987  . Pulmonary HTN ([place]) 04/03/2017   Moderate with PASP 17mmHg by echo 02/2017  .  Tricuspid regurgitation    severe by echo 02/25/12  . Vertigo   . Vitamin D deficiency 2011    Past Surgical History:  Procedure Laterality Date  . COLONOSCOPY W/ BIOPSIES AND POLYPECTOMY  09/16/2004   adenomatous polyp, diverticulosis, internal hemorrhoids  . s/p BLT    . TUBAL LIGATION      Current Medications: Current Meds  Medication Sig  . CALCIUM CITRATE PO Take 600 mg by mouth daily.  . Cholecalciferol (VITAMIN D3) 2000 units TABS Take 2,000 Units by mouth daily.  Marland Kitchen COUMADIN 2 MG tablet TAKE AS DIRECTED BY COUMADIN CLINIC  . Cyanocobalamin (VITAMIN B-12 PO) Take 1 tablet by mouth daily.  . furosemide (LASIX) 40 MG tablet Take 1 tablet (40mg )  in AM and 1/2 tab (20mg ) in PM  . irbesartan (AVAPRO) 150 MG tablet Take 150 mg by mouth daily.  Marland Kitchen LORazepam (ATIVAN) 1 MG tablet Take 1 mg by mouth 2 (two) times daily as needed (nervousness).   . pantoprazole (PROTONIX) 40 MG tablet Take 40 mg by mouth daily as needed (heartburn or acid reflux).   . rosuvastatin (CRESTOR) 10 MG tablet Take 10 mg by mouth daily.  Marland Kitchen SYNTHROID 50 MCG tablet Take 1 tablet by mouth daily OES  . TOPROL XL 100 MG 24 hr tablet TAKE 1 TABLET(100 MG) BY MOUTH DAILY  . triamcinolone cream (KENALOG) 0.1 % APPLY ONE APPLICATION TO RASH TWICE DAILY  AS NEEDED. Use as directed     Allergies:   Doxycycline; Ezetimibe; Meclizine; Pravastatin; Colestipol; Sulfamethoxazole; Ace inhibitors; Codeine; Colestipol hcl; Lovastatin; and Sulfa antibiotics   Social History   Socioeconomic History  . Marital status: Widowed    Spouse name: Not on file  . Number of children: 7  . Years of education: Not on file  . Highest education level: Not on file  Occupational History  . Occupation: Retired    Comment: Production assistant, radio  Social Needs  . Financial resource strain: Not on file  . Food insecurity:    Worry: Not on file    Inability: Not on file  . Transportation needs:    Medical: Not on file    Non-medical: Not on file    Tobacco Use  . Smoking status: Never Smoker  . Smokeless tobacco: Never Used  Substance and Sexual Activity  . Alcohol use: No  . Drug use: No  . Sexual activity: Not on file  Lifestyle  . Physical activity:    Days per week: Not on file    Minutes per session: Not on file  . Stress: Not on file  Relationships  . Social connections:    Talks on phone: Not on file    Gets together: Not on file    Attends religious service: Not on file    Active member of club or organization: Not on file    Attends meetings of clubs or organizations: Not on file    Relationship status: Not on file  Other Topics Concern  . Not on file  Social History Narrative  . Not on file     Family History: The patient's family history includes Diabetes in her mother; Heart disease (age of onset: 54) in her brother; Hypertension in her father; Liver cancer in her sister; Peripheral vascular disease in her father.  ROS:   Please see the history of present illness.    ROS  All other systems reviewed and negative.   EKGs/Labs/Other Studies Reviewed:    The following studies were reviewed today: none  EKG:  EKG is not ordered today.  Recent Labs: 01/26/2018: NT-Pro BNP 3,754 03/25/2018: BUN 20; Creatinine, Ser 1.34; Potassium 3.8; Sodium 141   Recent Lipid Panel No results found for: CHOL, TRIG, HDL, CHOLHDL, VLDL, LDLCALC, LDLDIRECT  Physical Exam:    VS:  BP (!) 142/88   Pulse 86   Ht 5\' 6"  (1.676 m)   Wt 167 lb 12.8 oz (76.1 kg)   SpO2 99%   BMI 27.08 kg/m     Wt Readings from Last 3 Encounters:  01/17/19 167 lb 12.8 oz (76.1 kg)  06/21/18 168 lb (76.2 kg)  03/25/18 169 lb 4 oz (76.8 kg)     GEN:  Well nourished, well developed in no acute distress HEENT: Normal NECK: No JVD; No carotid bruits LYMPHATICS: No lymphadenopathy CARDIAC: irregularly irregular and tachy, no murmurs, rubs, gallops RESPIRATORY:  Clear to auscultation without rales, wheezing or rhonchi  ABDOMEN: Soft,  non-tender, non-distended MUSCULOSKELETAL:  No edema; No deformity  SKIN: Warm and dry NEUROLOGIC:  Alert and oriented x 3 PSYCHIATRIC:  Normal affect   ASSESSMENT:    1. Permanent atrial fibrillation   2. Chronic diastolic CHF (congestive heart failure) ([place])   3. HYPERTENSION, MILD   4. Tricuspid valve insufficiency, unspecified etiology   5. Pulmonary HTN ([place])    PLAN:    In order of problems listed above:  1.  Permanent atrial fibrillation -  HR appears elevated today.  I will get an event monitor to assess HR control.  She will continue on Toprol-XL 100 mg daily.  Is on Coumadin for chronic anticoagulation.  2.  Chronic diastolic CHF -she appears euvolemic on exam today.  She will continue on Lasix 40 mg every morning and 20 mg every afternoon.  3.  HTN - BP is controlled on exam today.  She will continue on irbesartan 150 mg daily Toprol XL 100 mg daily.  It was stable at 1.34 on 03/25/2018 and potassium is 3.8.  I will repeat a bemet today.  4.  Severe TR - severe by echo 03/16/2017 due to annular dilatation from dilated RV.  Echo showed mildly dilated RV with preserved RV function.  Given advanced age isolated TR repair surgically is not recommended.  This has been stable with diuretic therapy 06/25/2018 showed a PASP of 41 mmHg with severe TR secondary to annular dilatation from RV enlargement and moderate RV dysfunction. .  5.  Pulmonary HTN - Continue diuretics.  Patient refused sleep study and PFTs were incomplete.  VQ scan showed no PE.  She is not interested in sleep study at this time.   Medication Adjustments/Labs and Tests Ordered: Current medicines are reviewed at length with the patient today.  Concerns regarding medicines are outlined above.  No orders of the defined types were placed in this encounter.  No orders of the defined types were placed in this encounter.   Signed, Fransico Him, MD  01/17/2019 11:33 AM    [place]

## 2019-01-17 NOTE — Patient Instructions (Signed)
Medication Instructions:  Your physician recommends that you continue on your current medications as directed. Please refer to the Current Medication list given to you today.  If you need a refill on your cardiac medications before your next appointment, please call your pharmacy.   Lab work: Today: CBC and BMET  If you have labs (blood work) drawn today and your tests are completely normal, you will receive your results only by: Marland Kitchen MyChart Message (if you have MyChart) OR . A paper copy in the mail If you have any lab test that is abnormal or we need to change your treatment, we will call you to review the results.  Testing/Procedures: Your physician has recommended that you wear a 24 hour holter monitor. Holter monitors are medical devices that record the heart's electrical activity. Doctors most often use these monitors to diagnose arrhythmias. Arrhythmias are problems with the speed or rhythm of the heartbeat. The monitor is a small, portable device. You can wear one while you do your normal daily activities. This is usually used to diagnose what is causing palpitations/syncope (passing out).  Follow-Up: At [facility], you and your health needs are our priority.  As part of our continuing mission to provide you with exceptional heart care, we have created designated Provider Care Teams.  These Care Teams include your primary Cardiologist (physician) and Advanced Practice Providers (APPs -  Physician Assistants and Nurse Practitioners) who all work together to provide you with the care you need, when you need it.  Your physician recommends that you schedule a follow-up appointment in: 6 months with PA.    You will need a follow up appointment in 1 years.  Please call our office 2 months in advance to schedule this appointment.  You may see Dr. Radford Pax or one of the following Advanced Practice Providers on your designated Care Team:   [place], PA-C Melina Copa, PA-C . Ermalinda Barrios, PA-C

## 2019-01-18 LAB — CBC
HEMATOCRIT: 42.9 % (ref 34.0–46.6)
Hemoglobin: 13.9 g/dL (ref 11.1–15.9)
MCH: 28.7 pg (ref 26.6–33.0)
MCHC: 32.4 g/dL (ref 31.5–35.7)
MCV: 89 fL (ref 79–97)
Platelets: 169 10*3/uL (ref 150–450)
RBC: 4.84 x10E6/uL (ref 3.77–5.28)
RDW: 13.6 % (ref 11.7–15.4)
WBC: 3.6 10*3/uL (ref 3.4–10.8)

## 2019-01-18 LAB — BASIC METABOLIC PANEL
BUN/Creatinine Ratio: 12 (ref 12–28)
BUN: 15 mg/dL (ref 8–27)
CALCIUM: 10.5 mg/dL — AB (ref 8.7–10.3)
CHLORIDE: 100 mmol/L (ref 96–106)
CO2: 25 mmol/L (ref 20–29)
CREATININE: 1.28 mg/dL — AB (ref 0.57–1.00)
GFR calc Af Amer: 44 mL/min/{1.73_m2} — ABNORMAL LOW (ref >59)
GFR calc non Af Amer: 38 mL/min/{1.73_m2} — ABNORMAL LOW (ref >59)
GLUCOSE: 91 mg/dL (ref 65–99)
Potassium: 3.9 mmol/L (ref 3.5–5.2)
Sodium: 145 mmol/L — ABNORMAL HIGH (ref 134–144)

## 2019-02-01 ENCOUNTER — Other Ambulatory Visit: Payer: Self-pay | Admitting: Cardiology

## 2019-02-01 ENCOUNTER — Ambulatory Visit (INDEPENDENT_AMBULATORY_CARE_PROVIDER_SITE_OTHER): Payer: Medicare Other

## 2019-02-01 DIAGNOSIS — I482 Chronic atrial fibrillation, unspecified: Secondary | ICD-10-CM | POA: Diagnosis not present

## 2019-02-01 DIAGNOSIS — I4821 Permanent atrial fibrillation: Secondary | ICD-10-CM

## 2019-02-08 ENCOUNTER — Ambulatory Visit (INDEPENDENT_AMBULATORY_CARE_PROVIDER_SITE_OTHER): Payer: Medicare Other | Admitting: *Deleted

## 2019-02-08 DIAGNOSIS — Z5181 Encounter for therapeutic drug level monitoring: Secondary | ICD-10-CM | POA: Diagnosis not present

## 2019-02-08 DIAGNOSIS — I4821 Permanent atrial fibrillation: Secondary | ICD-10-CM | POA: Diagnosis not present

## 2019-02-08 DIAGNOSIS — Z7901 Long term (current) use of anticoagulants: Secondary | ICD-10-CM | POA: Diagnosis not present

## 2019-02-08 DIAGNOSIS — I4891 Unspecified atrial fibrillation: Secondary | ICD-10-CM

## 2019-02-08 LAB — POCT INR: INR: 2 (ref 2.0–3.0)

## 2019-02-08 NOTE — Patient Instructions (Signed)
Description   Today take 1.5 tablets then continue taking same dosage 1 tablet every day. Recheck in 6 weeks. Call us any new medication change # [phone].      

## 2019-03-28 ENCOUNTER — Telehealth: Payer: Self-pay | Admitting: Pharmacist

## 2019-03-28 NOTE — Telephone Encounter (Signed)

## 2019-03-29 ENCOUNTER — Other Ambulatory Visit: Payer: Self-pay

## 2019-03-29 ENCOUNTER — Ambulatory Visit (INDEPENDENT_AMBULATORY_CARE_PROVIDER_SITE_OTHER): Payer: Medicare Other | Admitting: Pharmacist

## 2019-03-29 DIAGNOSIS — Z5181 Encounter for therapeutic drug level monitoring: Secondary | ICD-10-CM | POA: Diagnosis not present

## 2019-03-29 DIAGNOSIS — Z7901 Long term (current) use of anticoagulants: Secondary | ICD-10-CM | POA: Diagnosis not present

## 2019-03-29 DIAGNOSIS — I4821 Permanent atrial fibrillation: Secondary | ICD-10-CM | POA: Diagnosis not present

## 2019-03-29 DIAGNOSIS — I4891 Unspecified atrial fibrillation: Secondary | ICD-10-CM

## 2019-03-29 LAB — POCT INR: INR: 2 (ref 2.0–3.0)

## 2019-05-01 ENCOUNTER — Other Ambulatory Visit: Payer: Self-pay | Admitting: Internal Medicine

## 2019-05-20 ENCOUNTER — Telehealth: Payer: Self-pay

## 2019-05-20 NOTE — Telephone Encounter (Signed)

## 2019-05-24 ENCOUNTER — Ambulatory Visit (INDEPENDENT_AMBULATORY_CARE_PROVIDER_SITE_OTHER): Payer: Medicare Other | Admitting: Pharmacist

## 2019-05-24 ENCOUNTER — Other Ambulatory Visit: Payer: Self-pay

## 2019-05-24 DIAGNOSIS — I4891 Unspecified atrial fibrillation: Secondary | ICD-10-CM | POA: Diagnosis not present

## 2019-05-24 DIAGNOSIS — Z7901 Long term (current) use of anticoagulants: Secondary | ICD-10-CM | POA: Diagnosis not present

## 2019-05-24 DIAGNOSIS — I4821 Permanent atrial fibrillation: Secondary | ICD-10-CM

## 2019-05-24 DIAGNOSIS — Z5181 Encounter for therapeutic drug level monitoring: Secondary | ICD-10-CM

## 2019-05-24 LAB — POCT INR: INR: 2.5 (ref 2.0–3.0)

## 2019-06-20 ENCOUNTER — Other Ambulatory Visit: Payer: Self-pay | Admitting: Cardiology

## 2019-07-18 ENCOUNTER — Telehealth: Payer: Self-pay

## 2019-07-18 NOTE — Progress Notes (Signed)
Cardiology Office Note    Date:  07/19/2019   ID:  Lori Bridges, DOB [birth year], MRN [id number]  PCP:  Derinda Late, MD  Cardiologist: Fransico Him, MD EPS: None  Chief Complaint  Patient presents with  . Follow-up    History of Present Illness:  Lori Bridges is a 85 y.o. female with history of hypertension, HLD, permanent atrial fibrillation on Coumadin, chronic diastolic CHF, RV failure with severe TR with moderate pulmonary hypertension.  Low risk NST 2018.  Work-up for pulmonary hypertension included normal RF, sed rate and ANA.  Patient canceled sleep study.  PFTs were incomplete.  Patient last saw Dr. Radford Bridges 12/2018 at which time she had mild dyspnea on exertion and chronic vertigo.  Last echo 05/2018 normal LVEF, severely dilated RV with moderate RV dysfunction severe TR, PA peak pressure 41 mmHg. Holter monitor 01/2019 Afib rate controlled with occasional PVC's.  Patient comes in for yearly f/u. Has 2 grandsons living with her but causes her stress. Patient has lost 17 lbs since last year-not much appetite. Has some swelling but doesn't bother her. No shortness of breath. Very sedentary, doesn't leave the house. Has 6 girls and 1 son that help her with groceries.  Past Medical History:  Diagnosis Date  . Adenomatous colon polyp 2005  . Anxiety age 25  . Carotid artery bruit 2001   left  . DDD (degenerative disc disease)   . DJD (degenerative joint disease)   . Gastroenteritis 2011  . GERD (gastroesophageal reflux disease) 2003  . History of pneumonia   . HTN (hypertension) 1983  . Hyperlipidemia 2000  . Hypothyroidism 2009  . Iron deficiency anemia 1983  . Osteoarthritis 2001  . Osteoporosis 2001  . Persistent atrial fibrillation 07/23/2010       . Persistent headaches 1987  . Pulmonary HTN ([place]) 04/03/2017   Moderate with PASP 25mmHg by echo 02/2017  . Tricuspid regurgitation    severe by echo 02/25/12  . Vertigo   . Vitamin D deficiency 2011    Past  Surgical History:  Procedure Laterality Date  . COLONOSCOPY W/ BIOPSIES AND POLYPECTOMY  09/16/2004   adenomatous polyp, diverticulosis, internal hemorrhoids  . s/p BLT    . TUBAL LIGATION      Current Medications: Current Meds  Medication Sig  . CALCIUM CITRATE PO Take 600 mg by mouth daily.  . Cholecalciferol (VITAMIN D3) 2000 units TABS Take 2,000 Units by mouth daily.  Marland Kitchen COUMADIN 2 MG tablet TAKE 1 TABLET BY MOUTH EVERY DAY  . Cyanocobalamin (VITAMIN B-12 PO) Take 1 tablet by mouth daily.  . furosemide (LASIX) 40 MG tablet Take 1 tablet (40mg )  in AM and 1/2 tab (20mg ) in PM  . irbesartan (AVAPRO) 150 MG tablet Take 1 tablet (150 mg total) by mouth daily.  Marland Kitchen LORazepam (ATIVAN) 1 MG tablet Take 1 mg by mouth 2 (two) times daily as needed (nervousness).   . metoprolol succinate (TOPROL XL) 100 MG 24 hr tablet TAKE 1 TABLET(100 MG) BY MOUTH DAILY  . pantoprazole (PROTONIX) 40 MG tablet Take 40 mg by mouth daily as needed (heartburn or acid reflux).   . rosuvastatin (CRESTOR) 10 MG tablet Take 10 mg by mouth daily.  Marland Kitchen SYNTHROID 50 MCG tablet Take 1 tablet by mouth daily OES  . triamcinolone cream (KENALOG) 0.1 % APPLY ONE APPLICATION TO RASH TWICE DAILY AS NEEDED. Use as directed  . [DISCONTINUED] irbesartan (AVAPRO) 150 MG tablet Take 150 mg by mouth daily.  . [  DISCONTINUED] TOPROL XL 100 MG 24 hr tablet TAKE 1 TABLET(100 MG) BY MOUTH DAILY     Allergies:   Doxycycline, Ezetimibe, Meclizine, Pravastatin, Colestipol, Sulfamethoxazole, Ace inhibitors, Codeine, Colestipol hcl, Lovastatin, and Sulfa antibiotics   Social History   Socioeconomic History  . Marital status: Widowed    Spouse name: Not on file  . Number of children: 7  . Years of education: Not on file  . Highest education level: Not on file  Occupational History  . Occupation: Retired    Comment: Production assistant, radio  Social Needs  . Financial resource strain: Not on file  . Food insecurity    Worry: Not on file     Inability: Not on file  . Transportation needs    Medical: Not on file    Non-medical: Not on file  Tobacco Use  . Smoking status: Never Smoker  . Smokeless tobacco: Never Used  Substance and Sexual Activity  . Alcohol use: No  . Drug use: No  . Sexual activity: Not on file  Lifestyle  . Physical activity    Days per week: Not on file    Minutes per session: Not on file  . Stress: Not on file  Relationships  . Social Herbalist on phone: Not on file    Gets together: Not on file    Attends religious service: Not on file    Active member of club or organization: Not on file    Attends meetings of clubs or organizations: Not on file    Relationship status: Not on file  Other Topics Concern  . Not on file  Social History Narrative  . Not on file     Family History:  The patient's   family history includes Diabetes in her mother; Heart disease (age of onset: 44) in her brother; Hypertension in her father; Liver cancer in her sister; Peripheral vascular disease in her father.   ROS:   Please see the history of present illness.    Review of Systems  Constitution: Positive for decreased appetite and weight loss.  HENT: Negative.   Eyes: Negative.   Cardiovascular: Negative.   Respiratory: Negative.   Hematologic/Lymphatic: Negative.   Musculoskeletal: Positive for arthritis. Negative for joint pain.  Gastrointestinal: Negative.   Genitourinary: Negative.   Neurological: Negative.    All other systems reviewed and are negative.   PHYSICAL EXAM:   VS:  BP 126/86   Pulse 89   Ht 5\' 6"  (1.676 m)   Wt 151 lb (68.5 kg)   SpO2 95%   BMI 24.37 kg/m   Physical Exam  GEN: Well nourished, well developed, in no acute distress  Neck: no JVD, carotid bruits, or masses Cardiac:RRR; 2/6 systolic murmur LSB  Respiratory:  clear to auscultation bilaterally, normal work of breathing GI: soft, nontender, nondistended, + BS Ext: without cyanosis, clubbing, or edema, Good  distal pulses bilaterally Neuro:  Alert and Oriented x 3 Psych: euthymic mood, full affect  Wt Readings from Last 3 Encounters:  07/19/19 151 lb (68.5 kg)  01/17/19 167 lb 12.8 oz (76.1 kg)  06/21/18 168 lb (76.2 kg)      Studies/Labs Reviewed:   EKG:  EKG is ordered today.  The ekg ordered today demonstrates Afib at 89/m poor R wave progression anteriorly.  Recent Labs: 01/17/2019: BUN 15; Creatinine, Ser 1.28; Hemoglobin 13.9; Platelets 169; Potassium 3.9; Sodium 145   Lipid Panel No results found for: CHOL, TRIG, HDL, CHOLHDL, VLDL, LDLCALC,  LDLDIRECT  Additional studies/ records that were reviewed today include:  Holter 01/2019  Atrial fibrillation with heart rate ranging from 56 to 128 bpm. Average heart rate 84 bpm.  Occasional PVC with PVC load 0.44%.    Echo 6/2019Study Conclusions   - Left ventricle: The LV appears small and underfilled. The cavity   size was below normal. Wall thickness was increased in a pattern   of mild LVH. Systolic function was normal. The estimated ejection   fraction was in the range of 60% to 65%. Wall motion was normal;   there were no regional wall motion abnormalities. The study is   not technically sufficient to allow evaluation of LV diastolic   function. - Aortic valve: Mildly calcified annulus. Trileaflet; mildly   thickened leaflets. There was mild regurgitation. Valve area   (VTI): 1.76 cm^2. Valve area (Vmax): 2.09 cm^2. Valve area   (Vmean): 2.07 cm^2. - Mitral valve: Mildly calcified annulus. Mildly thickened leaflets   . There was mild regurgitation. - Left atrium: The atrium was severely dilated. - Right ventricle: The cavity size was severely dilated. Systolic   function was moderately reduced. - Right atrium: The atrium was massively dilated. - Tricuspid valve: The TV anulus is severely dilated, there is very   poor coapation of the TV leaflets. There was severe   regurgitation. - Pulmonary arteries: Systolic pressure  was moderately increased.   PA peak pressure: 41 mm Hg (S). - Technically adequate study.       ASSESSMENT:    1. Chronic diastolic CHF (congestive heart failure) ([place])   2. Permanent atrial fibrillation   3. Pulmonary HTN ([place])   4. Tricuspid valve insufficiency, unspecified etiology   5. HYPERTENSION, MILD      PLAN:  In order of problems listed above:  Chronic diastolic CHF with RV failure secondary to severe TR on Lasix 40 mg in the morning and rarely uses 20 in the afternoon. PCP to check labs next month.Crt 1.28 12/2018   Permanent atrial fibrillation rate controlled on Toprol and Coumadin. holter 01/2019 stable  Pulmonary hypertension with RV failure and severe TR on echo 05/2018 given advanced age surgery is not recommended.  Maintained on diuretics-not needing as much lasix since weight loss.  Hypertension controlled on irbesartan and Toprol     Medication Adjustments/Labs and Tests Ordered: Current medicines are reviewed at length with the patient today.  Concerns regarding medicines are outlined above.  Medication changes, Labs and Tests ordered today are listed in the Patient Instructions below. Patient Instructions  Medication Instructions:  Your physician recommends that you continue on your current medications as directed. Please refer to the Current Medication list given to you today.  If you need a refill on your cardiac medications before your next appointment, please call your pharmacy.   Lab work: None ordered  If you have labs (blood work) drawn today and your tests are completely normal, you will receive your results only by: Marland Kitchen MyChart Message (if you have MyChart) OR . A paper copy in the mail If you have any lab test that is abnormal or we need to change your treatment, we will call you to review the results.  Testing/Procedures: None ordered  Follow-Up: At [facility], you and your health needs are our priority.  As part of our continuing  mission to provide you with exceptional heart care, we have created designated Provider Care Teams.  These Care Teams include your primary Cardiologist (physician) and Advanced Practice Providers (  APPs -  Physician Assistants and Nurse Practitioners) who all work together to provide you with the care you need, when you need it. You will need a follow up appointment in 6 months.  Please call our office 2 months in advance to schedule this appointment.  You may see Fransico Him, MD or one of the following Advanced Practice Providers on your designated Care Team:   [place], PA-C Melina Copa, PA-C . Ermalinda Barrios, PA-C  Any Other Special Instructions Will Be Listed Below (If Applicable).       Sumner Boast, PA-C  07/19/2019 10:03 AM    [facility] [place], [place], [place]  [address] Phone: [phone]; Fax: [phone]

## 2019-07-18 NOTE — Telephone Encounter (Signed)

## 2019-07-19 ENCOUNTER — Encounter: Payer: Self-pay | Admitting: Physician Assistant

## 2019-07-19 ENCOUNTER — Ambulatory Visit (INDEPENDENT_AMBULATORY_CARE_PROVIDER_SITE_OTHER): Payer: Medicare Other | Admitting: Physician Assistant

## 2019-07-19 ENCOUNTER — Other Ambulatory Visit: Payer: Self-pay

## 2019-07-19 ENCOUNTER — Ambulatory Visit (INDEPENDENT_AMBULATORY_CARE_PROVIDER_SITE_OTHER): Payer: Medicare Other | Admitting: *Deleted

## 2019-07-19 VITALS — BP 126/86 | HR 89 | Ht 66.0 in | Wt 151.0 lb

## 2019-07-19 DIAGNOSIS — I071 Rheumatic tricuspid insufficiency: Secondary | ICD-10-CM

## 2019-07-19 DIAGNOSIS — I272 Pulmonary hypertension, unspecified: Secondary | ICD-10-CM

## 2019-07-19 DIAGNOSIS — Z5181 Encounter for therapeutic drug level monitoring: Secondary | ICD-10-CM

## 2019-07-19 DIAGNOSIS — I4891 Unspecified atrial fibrillation: Secondary | ICD-10-CM | POA: Diagnosis not present

## 2019-07-19 DIAGNOSIS — I4821 Permanent atrial fibrillation: Secondary | ICD-10-CM | POA: Diagnosis not present

## 2019-07-19 DIAGNOSIS — I5032 Chronic diastolic (congestive) heart failure: Secondary | ICD-10-CM

## 2019-07-19 DIAGNOSIS — Z7901 Long term (current) use of anticoagulants: Secondary | ICD-10-CM | POA: Diagnosis not present

## 2019-07-19 DIAGNOSIS — I10 Essential (primary) hypertension: Secondary | ICD-10-CM

## 2019-07-19 LAB — POCT INR: INR: 2.5 (ref 2.0–3.0)

## 2019-07-19 MED ORDER — IRBESARTAN 150 MG PO TABS: 150.0000 mg | ORAL_TABLET | Freq: Every day | ORAL | 11 refills | Status: DC

## 2019-07-19 MED ORDER — METOPROLOL SUCCINATE ER 100 MG PO TB24: ORAL_TABLET | ORAL | 1 refills | Status: DC

## 2019-07-19 NOTE — Patient Instructions (Signed)
Description   Continue taking same dosage 1 tablet every day. Recheck in 8 weeks. Call us any new medication change # [phone].

## 2019-07-19 NOTE — Patient Instructions (Signed)
Medication Instructions:  Your physician recommends that you continue on your current medications as directed. Please refer to the Current Medication list given to you today.  If you need a refill on your cardiac medications before your next appointment, please call your pharmacy.   Lab work: None ordered  If you have labs (blood work) drawn today and your tests are completely normal, you will receive your results only by: . MyChart Message (if you have MyChart) OR . A paper copy in the mail If you have any lab test that is abnormal or we need to change your treatment, we will call you to review the results.  Testing/Procedures: None ordered  Follow-Up: At [facility], you and your health needs are our priority.  As part of our continuing mission to provide you with exceptional heart care, we have created designated Provider Care Teams.  These Care Teams include your primary Cardiologist (physician) and Advanced Practice Providers (APPs -  Physician Assistants and Nurse Practitioners) who all work together to provide you with the care you need, when you need it. You will need a follow up appointment in 6 months.  Please call our office 2 months in advance to schedule this appointment.  You may see Traci Turner, MD or one of the following Advanced Practice Providers on your designated Care Team:   Brittainy Simmons, PA-C Dayna Dunn, PA-C . Michele Lenze, PA-C  Any Other Special Instructions Will Be Listed Below (If Applicable).    

## 2019-09-07 ENCOUNTER — Ambulatory Visit
Admission: RE | Admit: 2019-09-07 | Discharge: 2019-09-07 | Disposition: A | Discharge status: Home / Self Care | Payer: Medicare Other | Source: Ambulatory Visit | Attending: Family Medicine | Admitting: Family Medicine

## 2019-09-07 ENCOUNTER — Other Ambulatory Visit: Payer: Self-pay | Admitting: Family Medicine

## 2019-09-07 ENCOUNTER — Other Ambulatory Visit: Payer: Self-pay

## 2019-09-07 DIAGNOSIS — R52 Pain, unspecified: Secondary | ICD-10-CM

## 2019-09-13 ENCOUNTER — Other Ambulatory Visit: Payer: Self-pay | Admitting: Internal Medicine

## 2019-09-13 ENCOUNTER — Other Ambulatory Visit: Payer: Self-pay

## 2019-09-13 ENCOUNTER — Ambulatory Visit (INDEPENDENT_AMBULATORY_CARE_PROVIDER_SITE_OTHER): Payer: Medicare Other | Admitting: *Deleted

## 2019-09-13 DIAGNOSIS — Z5181 Encounter for therapeutic drug level monitoring: Secondary | ICD-10-CM

## 2019-09-13 DIAGNOSIS — I4821 Permanent atrial fibrillation: Secondary | ICD-10-CM | POA: Diagnosis not present

## 2019-09-13 DIAGNOSIS — Z7901 Long term (current) use of anticoagulants: Secondary | ICD-10-CM

## 2019-09-13 DIAGNOSIS — I4891 Unspecified atrial fibrillation: Secondary | ICD-10-CM

## 2019-09-13 LAB — POCT INR: INR: 2.7 (ref 2.0–3.0)

## 2019-09-13 NOTE — Patient Instructions (Signed)
Description   Continue taking same dosage 1 tablet every day. Recheck in 8 weeks. Call us any new medication change # [phone].     

## 2019-10-17 ENCOUNTER — Telehealth: Payer: Self-pay | Admitting: Pharmacist

## 2019-10-17 NOTE — Telephone Encounter (Signed)
Patient currently on brand name Coumadin. No longer available. [facility] calling to get ok to fill generic. Per [employer] is no longer going to be manufactured either. [place] uses Called patient and advised her of the above. She has 8 coumadin tablets left. This would be about 2 week on generic before an INR check. I offered to move her appointment up one week. She kept saying she needed to check with her doctor to make sure the change was ok. I tried to explain that her cardiologist is ok with it and there is not much other option as brand name is not made anymore. She said she will still call him.  Will follow up with patient again at another time.

## 2019-11-01 ENCOUNTER — Ambulatory Visit (INDEPENDENT_AMBULATORY_CARE_PROVIDER_SITE_OTHER): Payer: Medicare Other | Admitting: *Deleted

## 2019-11-01 ENCOUNTER — Other Ambulatory Visit: Payer: Self-pay

## 2019-11-01 ENCOUNTER — Encounter (INDEPENDENT_AMBULATORY_CARE_PROVIDER_SITE_OTHER): Payer: Self-pay

## 2019-11-01 DIAGNOSIS — I4821 Permanent atrial fibrillation: Secondary | ICD-10-CM

## 2019-11-01 DIAGNOSIS — Z5181 Encounter for therapeutic drug level monitoring: Secondary | ICD-10-CM | POA: Diagnosis not present

## 2019-11-01 DIAGNOSIS — I4891 Unspecified atrial fibrillation: Secondary | ICD-10-CM

## 2019-11-01 DIAGNOSIS — Z7901 Long term (current) use of anticoagulants: Secondary | ICD-10-CM | POA: Diagnosis not present

## 2019-11-01 LAB — POCT INR: INR: 2.4 (ref 2.0–3.0)

## 2019-11-01 NOTE — Patient Instructions (Signed)
Description   Continue taking same dosage 1 tablet every day. Recheck in 8 weeks. Call us any new medication change # [phone].     

## 2019-12-02 ENCOUNTER — Telehealth: Payer: Self-pay | Admitting: *Deleted

## 2019-12-02 NOTE — Telephone Encounter (Signed)
Called and spoke to pt. Pt stated she received a call  that she has on office visit on 12/8. Informed her she has an appointment scheduled for 12/27/2019 to get her INR checked and she has an appointment with Dr. Radford Pax on 01/20/2020. Do not see any documentation in our office that pt should be coming in for office visit on 12/06/2019.

## 2019-12-02 NOTE — Telephone Encounter (Signed)
-----   Message from Johnna Acosta sent at 12/02/2019 12:35 PM EST ----- Regarding: Appointment Confusion Hello, This patient called [name] and said she got a confirmation for a coumadin clinic appointment  on Tuesday December 8th at 1:15 pm ( she may have even said 1:50, it was hard to understand).  She does not have an appointment that day, nor did she have one that was cancelled for any department. I could not see anything on my end, so I was hoping your team had more information. Please call her to clarify  Thanks, Tyler Pita

## 2019-12-27 ENCOUNTER — Ambulatory Visit (INDEPENDENT_AMBULATORY_CARE_PROVIDER_SITE_OTHER): Payer: Medicare Other | Admitting: *Deleted

## 2019-12-27 ENCOUNTER — Other Ambulatory Visit: Payer: Self-pay

## 2019-12-27 DIAGNOSIS — I4891 Unspecified atrial fibrillation: Secondary | ICD-10-CM | POA: Diagnosis not present

## 2019-12-27 DIAGNOSIS — Z7901 Long term (current) use of anticoagulants: Secondary | ICD-10-CM | POA: Diagnosis not present

## 2019-12-27 DIAGNOSIS — Z5181 Encounter for therapeutic drug level monitoring: Secondary | ICD-10-CM | POA: Diagnosis not present

## 2019-12-27 DIAGNOSIS — I4821 Permanent atrial fibrillation: Secondary | ICD-10-CM | POA: Diagnosis not present

## 2019-12-27 LAB — POCT INR: INR: 1.8 — AB (ref 2.0–3.0)

## 2019-12-27 NOTE — Patient Instructions (Signed)
Description   Today take 1.5 tablets then continue taking same dosage 1 tablet every day. Recheck in 3-4 weeks with MD appt (normal 8wks). Call us any new medication change # [phone].

## 2020-01-13 ENCOUNTER — Telehealth: Payer: Self-pay

## 2020-01-13 ENCOUNTER — Telehealth: Payer: Self-pay | Admitting: Cardiology

## 2020-01-13 NOTE — Telephone Encounter (Signed)
See if you can get patient in with her PCP today.  If not then work in with extender on monday

## 2020-01-13 NOTE — Telephone Encounter (Signed)
I spoke to the patient who will try to get into see her PCP or one of his associates today.  She will call us if un able to see her PCP.

## 2020-01-13 NOTE — Telephone Encounter (Signed)
I spoke to the patient who could not see her PCP today so I scheduled her with Dr Radford Pax on 1/19.  She verbalized understanding.

## 2020-01-13 NOTE — Telephone Encounter (Signed)
Patient calling complaining of irregular heartbeat, lightheadedness, and shaking feeling in her back. Patient stated she does not have any pain in her back or chest. Patient denies any SOB. Patient wanted to be seen today by Dr. Radford Pax. Informed patient that Dr. Radford Pax does not have any openings today. Informed patient if she feels like she needs to be seen and with her symptoms she might need to go to the ED. Patient stated she is not going to the ED. Patient has no way to check her BP or HR. Patient stated her symptoms do improve some after taking metoprolol, but only in the mornings. Patient stated this has been going on for several days. Will send message to Dr. Radford Pax for advisement.

## 2020-01-13 NOTE — Telephone Encounter (Signed)
New Message  Patient c/o Palpitations:  High priority if patient c/o lightheadedness, shortness of breath, or chest pain  1) How long have you had palpitations/irregular HR/ Afib? Are you having the symptoms now? Irregular heartbeat, dizzy  2) Are you currently experiencing lightheadedness, SOB or CP? lightheadedness  3) Do you have a history of afib (atrial fibrillation) or irregular heart rhythm? yes  4) Have you checked your BP or HR? (document readings if available): No  5) Are you experiencing any other symptoms?

## 2020-01-16 NOTE — Progress Notes (Signed)
Cardiology Office Note:    Date:  01/17/2020   ID:  Lori Bridges, DOB [birth year], MRN [id number]  PCP:  Derinda Late, MD  Cardiologist:  Fransico Him, MD    Referring MD: Derinda Late, MD   Chief Complaint  Patient presents with  . Atrial Fibrillation  . Congestive Heart Failure  . Hypertension    History of Present Illness:    Lori Bridges is a 86 y.o. female with a hx of  GERD, HTN, hyperlipidemia, permanent atrial fibrillation on chronic coumadinand chronic diastolicCHF. She has a history of severe TR by echo with RV volume overload with mild RVE and moderate pulmonary HTN with PASP 40mmHg. This dates back to 2013 by echo.2D echo showed normal LVF with moderate PHTN with PASP 35mmHg. It was felt that her LE edema was secondary to diastolic dysfunction, dietary indiscretion with sodium, RHF and cardizem. Her Cardizem was changed to Toprol. Nuclear stress test was low risk. Initial workup for PHTN included ANA, sed rate and RF which were normal. Sleep study was ordered but patient cancelled and PFTs were incomplete.  She is here today for followup and is doing well.  She denies any chest pain or pressure, SOB, DOE, PND, orthopnea, LE edema, dizziness, palpitations or syncope. She is compliant with her meds and is tolerating meds with no SE.     Past Medical History:  Diagnosis Date  . Adenomatous colon polyp 2005  . Anxiety age 47  . Carotid artery bruit 2001   left  . DDD (degenerative disc disease)   . DJD (degenerative joint disease)   . Gastroenteritis 2011  . GERD (gastroesophageal reflux disease) 2003  . History of pneumonia   . HTN (hypertension) 1983  . Hyperlipidemia 2000  . Hypothyroidism 2009  . Iron deficiency anemia 1983  . Osteoarthritis 2001  . Osteoporosis 2001  . Persistent atrial fibrillation ([place]) 07/23/2010       . Persistent headaches 1987  . Pulmonary HTN ([place]) 04/03/2017   Moderate with PASP 81mmHg by echo 02/2017  . Tricuspid  regurgitation    severe by echo 02/25/12  . Vertigo   . Vitamin D deficiency 2011    Past Surgical History:  Procedure Laterality Date  . COLONOSCOPY W/ BIOPSIES AND POLYPECTOMY  09/16/2004   adenomatous polyp, diverticulosis, internal hemorrhoids  . s/p BLT    . TUBAL LIGATION      Current Medications: Current Meds  Medication Sig  . Cholecalciferol (VITAMIN D3) 2000 units TABS Take 2,000 Units by mouth daily.  Marland Kitchen COUMADIN 2 MG tablet TAKE 1 TABLET BY MOUTH EVERY DAY  . Cyanocobalamin (VITAMIN B-12 PO) Take 1 tablet by mouth daily.  . furosemide (LASIX) 40 MG tablet Take 1 tablet (40mg )  in AM and 1/2 tab (20mg ) in PM (Patient taking differently: Take 1 tablet (40mg )  in AM)  . irbesartan (AVAPRO) 150 MG tablet Take 1 tablet (150 mg total) by mouth daily.  Marland Kitchen LORazepam (ATIVAN) 1 MG tablet Take 1 mg by mouth 2 (two) times daily as needed (nervousness).   . metoprolol succinate (TOPROL XL) 100 MG 24 hr tablet TAKE 1 TABLET(100 MG) BY MOUTH DAILY  . pantoprazole (PROTONIX) 40 MG tablet Take 40 mg by mouth daily as needed (heartburn or acid reflux).   . rosuvastatin (CRESTOR) 10 MG tablet Take 10 mg by mouth daily.  Marland Kitchen SYNTHROID 50 MCG tablet Take 1 tablet by mouth daily OES  . triamcinolone cream (KENALOG) 0.1 % APPLY ONE APPLICATION  TO RASH TWICE DAILY AS NEEDED. Use as directed  . [DISCONTINUED] furosemide (LASIX) 40 MG tablet Take 1 tablet (40mg )  in AM and 1/2 tab (20mg ) in PM     Allergies:   Doxycycline, Ezetimibe, Meclizine, Pravastatin, Colestipol, Sulfamethoxazole, Ace inhibitors, Codeine, Colestipol hcl, Lovastatin, and Sulfa antibiotics   Social History   Socioeconomic History  . Marital status: Widowed    Spouse name: Not on file  . Number of children: 7  . Years of education: Not on file  . Highest education level: Not on file  Occupational History  . Occupation: Retired    Comment: Production assistant, radio  Tobacco Use  . Smoking status: Never Smoker  . Smokeless tobacco:  Never Used  Substance and Sexual Activity  . Alcohol use: No  . Drug use: No  . Sexual activity: Not on file  Other Topics Concern  . Not on file  Social History Narrative  . Not on file   Social Determinants of Health   Financial Resource Strain:   . Difficulty of Paying Living Expenses: Not on file  Food Insecurity:   . Worried About Charity fundraiser in the Last Year: Not on file  . Ran Out of Food in the Last Year: Not on file  Transportation Needs:   . Lack of Transportation (Medical): Not on file  . Lack of Transportation (Non-Medical): Not on file  Physical Activity:   . Days of Exercise per Week: Not on file  . Minutes of Exercise per Session: Not on file  Stress:   . Feeling of Stress : Not on file  Social Connections:   . Frequency of Communication with Friends and Family: Not on file  . Frequency of Social Gatherings with Friends and Family: Not on file  . Attends Religious Services: Not on file  . Active Member of Clubs or Organizations: Not on file  . Attends Archivist Meetings: Not on file  . Marital Status: Not on file     Family History: The patient's family history includes Diabetes in her mother; Heart disease (age of onset: 42) in her brother; Hypertension in her father; Liver cancer in her sister; Peripheral vascular disease in her father.  ROS:   Please see the history of present illness.    ROS  All other systems reviewed and negative.   EKGs/Labs/Other Studies Reviewed:    The following studies were reviewed today: 2D echo, labs, EKG  EKG:  EKG is  ordered today.  The ekg ordered today demonstrates atrial fibrillation with CVR and no ST changes.  Anterior infarct.   Recent Labs: No results found for requested labs within last 8760 hours.   Recent Lipid Panel No results found for: CHOL, TRIG, HDL, CHOLHDL, VLDL, LDLCALC, LDLDIRECT  Physical Exam:    VS:  BP (!) 138/92   Pulse (!) 118   Ht 5\' 6"  (1.676 m)   Wt 133 lb 12.8  oz (60.7 kg)   SpO2 99%   BMI 21.60 kg/m     Wt Readings from Last 3 Encounters:  01/17/20 133 lb 12.8 oz (60.7 kg)  07/19/19 151 lb (68.5 kg)  01/17/19 167 lb 12.8 oz (76.1 kg)     GEN:  Well nourished, well developed in no acute distress HEENT: Normal NECK: No JVD; No carotid bruits LYMPHATICS: No lymphadenopathy CARDIAC: irregularly irregular, no murmurs, rubs, gallops RESPIRATORY:  Clear to auscultation without rales, wheezing or rhonchi  ABDOMEN: Soft, non-tender, non-distended MUSCULOSKELETAL:  No edema;  No deformity  SKIN: Warm and dry NEUROLOGIC:  Alert and oriented x 3 PSYCHIATRIC:  Normal affect   ASSESSMENT:    1. Permanent atrial fibrillation ([place])   2. Chronic diastolic CHF (congestive heart failure) ([place])   3. Essential hypertension   4. Tricuspid valve insufficiency, unspecified etiology   5. Pulmonary HTN ([place])    PLAN:    In order of problems listed above:  1.  Permanent atrial fibrillation -HR controlled -continue Toprol XL 100mg  daily and warfarin -denies any bleeding problems -check CBC  2.  Chronic diastolic CHF -appears euvolemic on exam -she has been having dizzy spells when getting up too fast especially at night if she has gone to the bathroom -decrease lasix to 20mg  daily -check BMET  3.  HTN -BP controlled -continue Irbesartan 150mg  daily and Toprol XL 100mg  daily  4.  Severe TR -2D echo 2019 with severe RVE and moderately reduced RVF, severe RAE -severe TR likely related to annular dilatation -given advanced age would not proceed with any surgical repair for TR  5.  Pulmonary HTN -PFTs were nondiagnostic and incomplete -VQ showed no PE -declined sleep study -PASP 10mmHg on echo 05/2018 -continue diuretics   Medication Adjustments/Labs and Tests Ordered: Current medicines are reviewed at length with the patient today.  Concerns regarding medicines are outlined above.  Orders Placed This Encounter  Procedures  . CBC  .  Basic metabolic panel  . EKG 12-Lead   Meds ordered this encounter  Medications  . furosemide (LASIX) 40 MG tablet    Sig: Take 1 tablet (40mg )  in AM and 1/2 tab (20mg ) in PM    Dispense:  135 tablet    Refill:  2    Please cancel all previous orders for current medication. Change in dosage or pill size.    Signed, Fransico Him, MD  01/17/2020 2:30 PM    [place]

## 2020-01-17 ENCOUNTER — Ambulatory Visit (INDEPENDENT_AMBULATORY_CARE_PROVIDER_SITE_OTHER): Payer: Medicare Other | Admitting: *Deleted

## 2020-01-17 ENCOUNTER — Encounter: Payer: Self-pay | Admitting: Cardiology

## 2020-01-17 ENCOUNTER — Ambulatory Visit (INDEPENDENT_AMBULATORY_CARE_PROVIDER_SITE_OTHER): Payer: Medicare Other | Admitting: Cardiology

## 2020-01-17 ENCOUNTER — Other Ambulatory Visit: Payer: Self-pay

## 2020-01-17 VITALS — BP 138/92 | HR 118 | Ht 66.0 in | Wt 133.8 lb

## 2020-01-17 DIAGNOSIS — I4891 Unspecified atrial fibrillation: Secondary | ICD-10-CM

## 2020-01-17 DIAGNOSIS — Z7901 Long term (current) use of anticoagulants: Secondary | ICD-10-CM | POA: Diagnosis not present

## 2020-01-17 DIAGNOSIS — I4821 Permanent atrial fibrillation: Secondary | ICD-10-CM

## 2020-01-17 DIAGNOSIS — Z5181 Encounter for therapeutic drug level monitoring: Secondary | ICD-10-CM

## 2020-01-17 DIAGNOSIS — I10 Essential (primary) hypertension: Secondary | ICD-10-CM

## 2020-01-17 DIAGNOSIS — I272 Pulmonary hypertension, unspecified: Secondary | ICD-10-CM

## 2020-01-17 DIAGNOSIS — I071 Rheumatic tricuspid insufficiency: Secondary | ICD-10-CM | POA: Diagnosis not present

## 2020-01-17 DIAGNOSIS — I5032 Chronic diastolic (congestive) heart failure: Secondary | ICD-10-CM | POA: Diagnosis not present

## 2020-01-17 LAB — POCT INR: INR: 2.6 (ref 2.0–3.0)

## 2020-01-17 MED ORDER — FUROSEMIDE 20 MG PO TABS: 20.0000 mg | ORAL_TABLET | Freq: Every day | ORAL | 3 refills | Status: DC

## 2020-01-17 MED ORDER — FUROSEMIDE 40 MG PO TABS: ORAL_TABLET | ORAL | 2 refills | Status: DC

## 2020-01-17 NOTE — Patient Instructions (Signed)
Medication Instructions:  Your physician has recommended you make the following change in your medication:   1. Decrease your Lasix to 20mg , 1/2 tablet, once daily  Labwork: You will have labs drawn today: CBC and BMP  Testing/Procedures: None ordered.  Follow-Up: Your physician recommends that you schedule a follow-up appointment in:   6 months with Dr. Radford Pax  Any Other Special Instructions Will Be Listed Below (If Applicable).     If you need a refill on your cardiac medications before your next appointment, please call your pharmacy.

## 2020-01-17 NOTE — Patient Instructions (Addendum)
Description   Continue taking same dosage 1 tablet every day. Recheck in 6 weeks. Call us any new medication change # [phone].

## 2020-01-18 LAB — BASIC METABOLIC PANEL
BUN/Creatinine Ratio: 8 — ABNORMAL LOW (ref 12–28)
BUN: 12 mg/dL (ref 8–27)
CO2: 30 mmol/L — ABNORMAL HIGH (ref 20–29)
Calcium: 11.2 mg/dL — ABNORMAL HIGH (ref 8.7–10.3)
Chloride: 100 mmol/L (ref 96–106)
Creatinine, Ser: 1.51 mg/dL — ABNORMAL HIGH (ref 0.57–1.00)
GFR calc Af Amer: 36 mL/min/{1.73_m2} — ABNORMAL LOW (ref >59)
GFR calc non Af Amer: 31 mL/min/{1.73_m2} — ABNORMAL LOW (ref >59)
Glucose: 78 mg/dL (ref 65–99)
Potassium: 3.5 mmol/L (ref 3.5–5.2)
Sodium: 144 mmol/L (ref 134–144)

## 2020-01-18 LAB — CBC
Hematocrit: 44.1 % (ref 34.0–46.6)
Hemoglobin: 14.7 g/dL (ref 11.1–15.9)
MCH: 29 pg (ref 26.6–33.0)
MCHC: 33.3 g/dL (ref 31.5–35.7)
MCV: 87 fL (ref 79–97)
Platelets: 183 10*3/uL (ref 150–450)
RBC: 5.07 x10E6/uL (ref 3.77–5.28)
RDW: 13.3 % (ref 11.7–15.4)
WBC: 4.3 10*3/uL (ref 3.4–10.8)

## 2020-01-20 ENCOUNTER — Ambulatory Visit: Payer: Medicare Other | Admitting: Cardiology

## 2020-02-09 ENCOUNTER — Other Ambulatory Visit: Payer: Self-pay | Admitting: Internal Medicine

## 2020-04-12 ENCOUNTER — Ambulatory Visit: Payer: Medicare Other | Admitting: Cardiology

## 2020-04-13 ENCOUNTER — Telehealth: Payer: Self-pay | Admitting: Cardiology

## 2020-04-13 NOTE — Telephone Encounter (Signed)
Returned a call to Mrs. Tyrone Nine, pt's daughter, and she stated the patient has had issues with remembering things, such as the last appt. The pt has not missed any appts in the past and the pt is a month overdue. Therefore, advised she will be able to come with pt to appt and she is aware we are limiting the amount of people in the office and that she will need to stay with the patient at all times. She verbalized understanding and confirmed next week appt.

## 2020-04-13 NOTE — Telephone Encounter (Signed)
New message:   Patient daughter calling stating that she need to come up with the patient she needs help getting around.

## 2020-04-17 ENCOUNTER — Ambulatory Visit (INDEPENDENT_AMBULATORY_CARE_PROVIDER_SITE_OTHER): Payer: Medicare Other | Admitting: *Deleted

## 2020-04-17 ENCOUNTER — Other Ambulatory Visit: Payer: Self-pay

## 2020-04-17 DIAGNOSIS — Z5181 Encounter for therapeutic drug level monitoring: Secondary | ICD-10-CM | POA: Diagnosis not present

## 2020-04-17 DIAGNOSIS — I4821 Permanent atrial fibrillation: Secondary | ICD-10-CM | POA: Diagnosis not present

## 2020-04-17 DIAGNOSIS — I4891 Unspecified atrial fibrillation: Secondary | ICD-10-CM | POA: Diagnosis not present

## 2020-04-17 DIAGNOSIS — Z7901 Long term (current) use of anticoagulants: Secondary | ICD-10-CM

## 2020-04-17 LAB — POCT INR: INR: 2 (ref 2.0–3.0)

## 2020-04-17 NOTE — Patient Instructions (Signed)
Description   Today take 1.5 tablets then continue taking same dosage 1 tablet every day. Recheck in 6 weeks. Call us any new medication change # [phone].

## 2020-04-26 ENCOUNTER — Other Ambulatory Visit: Payer: Self-pay | Admitting: Internal Medicine

## 2020-04-26 DIAGNOSIS — N1832 Chronic kidney disease, stage 3b: Secondary | ICD-10-CM

## 2020-05-09 ENCOUNTER — Other Ambulatory Visit: Payer: Self-pay | Admitting: *Deleted

## 2020-05-09 MED ORDER — WARFARIN SODIUM 2 MG PO TABS: 2.0000 mg | ORAL_TABLET | Freq: Every day | ORAL | 2 refills | Status: DC

## 2020-05-14 ENCOUNTER — Ambulatory Visit (INDEPENDENT_AMBULATORY_CARE_PROVIDER_SITE_OTHER): Payer: Medicare Other | Admitting: Cardiology

## 2020-05-14 ENCOUNTER — Other Ambulatory Visit: Payer: Self-pay

## 2020-05-14 ENCOUNTER — Encounter: Payer: Self-pay | Admitting: Cardiology

## 2020-05-14 VITALS — BP 162/104 | HR 108 | Ht 66.0 in | Wt 135.6 lb

## 2020-05-14 DIAGNOSIS — I5032 Chronic diastolic (congestive) heart failure: Secondary | ICD-10-CM | POA: Diagnosis not present

## 2020-05-14 DIAGNOSIS — I4821 Permanent atrial fibrillation: Secondary | ICD-10-CM

## 2020-05-14 DIAGNOSIS — I272 Pulmonary hypertension, unspecified: Secondary | ICD-10-CM

## 2020-05-14 DIAGNOSIS — I10 Essential (primary) hypertension: Secondary | ICD-10-CM | POA: Diagnosis not present

## 2020-05-14 DIAGNOSIS — I071 Rheumatic tricuspid insufficiency: Secondary | ICD-10-CM

## 2020-05-14 MED ORDER — METOPROLOL SUCCINATE ER 25 MG PO TB24
25.0000 mg | ORAL_TABLET | Freq: Every evening | ORAL | 3 refills | Status: DC
Start: 2020-05-14 — End: 2020-06-27

## 2020-05-14 NOTE — Progress Notes (Signed)
Cardiology Office Note:    Date:  05/14/2020   ID:  Lori Bridges, DOB 02/09/1930, MRN [id number]  PCP:  Lori Late, MD  Cardiologist:  Lori Him, MD    Referring MD: Lori Late, MD   Chief Complaint  Patient presents with  . Atrial Fibrillation  . Congestive Heart Failure  . Hypertension    History of Present Illness:    Lori Bridges is a 86 y.o. female with a hx of GERD, HTN, hyperlipidemia, permanent atrial fibrillation on chronic coumadinand chronic diastolicCHF. She has a history of severe TR by echo with RV volume overload with mild RVE and moderate pulmonary HTN with PASP 25mmHg. This dates back to 2013 by echo.2D echo showed normal LVF with moderate PHTN with PASP 22mmHg. It was felt that her LE edema was secondary to diastolic dysfunction, dietary indiscretion with sodium, RHF and cardizem. Her Cardizem was changed to Toprol. Nuclear stress test was low risk. Initial workup for PHTN included ANA, sed rate and RF which were normal. Sleep study was ordered but patient cancelled and PFTs were incomplete.  She is here today for followup and is doing well.  Unfortunately she has had problems with worsening ataxic gait recently that is thought to be due to the Lorazepam she is taking to help her sleep at night.  She sleeps a lot during the day and then cannot sleep at night.  At her last OV with her PCP her HR was higher than usual.  She has also had some problems with dizziness that her PCP thinks may be related to her afib. Her daughter is here with her today and they stopped giving her the Lorazepam during the day and now her ataxia as well as dizziness have completely resolved.  She denies any chest pain or pressure, SOB, DOE, PND, orthopnea, LE edema,  or syncope. Occasionally she will notice her heart racing. She is compliant with her meds and is tolerating meds with no SE.    Past Medical History:  Diagnosis Date  . Adenomatous colon polyp 2005  . Anxiety  age 50  . Carotid artery bruit 2001   left  . DDD (degenerative disc disease)   . DJD (degenerative joint disease)   . Gastroenteritis 2011  . GERD (gastroesophageal reflux disease) 2003  . History of pneumonia   . HTN (hypertension) 1983  . Hyperlipidemia 2000  . Hypothyroidism 2009  . Iron deficiency anemia 1983  . Osteoarthritis 2001  . Osteoporosis 2001  . Persistent atrial fibrillation ([place]) 07/23/2010       . Persistent headaches 1987  . Pulmonary HTN ([place]) 04/03/2017   Moderate with PASP 66mmHg by echo 02/2017  . Tricuspid regurgitation    severe by echo 02/25/12  . Vertigo   . Vitamin D deficiency 2011    Past Surgical History:  Procedure Laterality Date  . COLONOSCOPY W/ BIOPSIES AND POLYPECTOMY  09/16/2004   adenomatous polyp, diverticulosis, internal hemorrhoids  . s/p BLT    . TUBAL LIGATION      Current Medications: Current Meds  Medication Sig  . Cholecalciferol (VITAMIN D3) 2000 units TABS Take 2,000 Units by mouth daily.  . Cyanocobalamin (VITAMIN B-12 PO) Take 1 tablet by mouth daily.  . furosemide (LASIX) 20 MG tablet Take 1 tablet (20 mg total) by mouth daily.  . irbesartan (AVAPRO) 150 MG tablet Take 1 tablet (150 mg total) by mouth daily.  Marland Kitchen LORazepam (ATIVAN) 1 MG tablet Take 1 mg by mouth 2 (  two) times daily as needed (nervousness).   . metoprolol succinate (TOPROL XL) 100 MG 24 hr tablet TAKE 1 TABLET(100 MG) BY MOUTH DAILY  . pantoprazole (PROTONIX) 40 MG tablet Take 40 mg by mouth daily as needed (heartburn or acid reflux).   . SYNTHROID 50 MCG tablet Take 1 tablet by mouth daily OES  . warfarin (COUMADIN) 2 MG tablet Take 1 tablet (2 mg total) by mouth daily. Or as directed by the coumadin clinic     Allergies:   Doxycycline, Ezetimibe, Meclizine, Pravastatin, Colestipol, Sulfamethoxazole, Ace inhibitors, Codeine, Colestipol hcl, Lovastatin, and Sulfa antibiotics   Social History   Socioeconomic History  . Marital status: Widowed    Spouse  name: Not on file  . Number of children: 7  . Years of education: Not on file  . Highest education level: Not on file  Occupational History  . Occupation: Retired    Comment: Production assistant, radio  Tobacco Use  . Smoking status: Never Smoker  . Smokeless tobacco: Never Used  Substance and Sexual Activity  . Alcohol use: No  . Drug use: No  . Sexual activity: Not on file  Other Topics Concern  . Not on file  Social History Narrative  . Not on file   Social Determinants of Health   Financial Resource Strain:   . Difficulty of Paying Living Expenses:   Food Insecurity:   . Worried About Charity fundraiser in the Last Year:   . Arboriculturist in the Last Year:   Transportation Needs:   . Film/video editor (Medical):   Marland Kitchen Lack of Transportation (Non-Medical):   Physical Activity:   . Days of Exercise per Week:   . Minutes of Exercise per Session:   Stress:   . Feeling of Stress :   Social Connections:   . Frequency of Communication with Friends and Family:   . Frequency of Social Gatherings with Friends and Family:   . Attends Religious Services:   . Active Member of Clubs or Organizations:   . Attends Archivist Meetings:   Marland Kitchen Marital Status:      Family History: The patient's family history includes Diabetes in her mother; Heart disease (age of onset: 33) in her brother; Hypertension in her father; Liver cancer in her sister; Peripheral vascular disease in her father.  ROS:   Please see the history of present illness.    ROS  All other systems reviewed and negative.   EKGs/Labs/Other Studies Reviewed:    The following studies were reviewed today: none  EKG:  EKG is  ordered today and showed atrial fibrillation at 95bpm with low voltage QRS, inferior and anterior infarcts  Recent Labs: 01/17/2020: BUN 12; Creatinine, Ser 1.51; Hemoglobin 14.7; Platelets 183; Potassium 3.5; Sodium 144   Recent Lipid Panel No results found for: CHOL, TRIG, HDL, CHOLHDL,  VLDL, LDLCALC, LDLDIRECT  Physical Exam:    VS:  BP (!) 162/104   Pulse (!) 108   Ht 5\' 6"  (1.676 m)   Wt 135 lb 9.6 oz (61.5 kg)   SpO2 96%   BMI 21.89 kg/m     Wt Readings from Last 3 Encounters:  05/14/20 135 lb 9.6 oz (61.5 kg)  01/17/20 133 lb 12.8 oz (60.7 kg)  07/19/19 151 lb (68.5 kg)     GEN:  Well nourished, well developed in no acute distress HEENT: Normal NECK: No JVD; No carotid bruits LYMPHATICS: No lymphadenopathy CARDIAC: irregularly irregular, no murmurs, rubs,  gallops RESPIRATORY:  Clear to auscultation without rales, wheezing or rhonchi  ABDOMEN: Soft, non-tender, non-distended MUSCULOSKELETAL:  No edema; No deformity  SKIN: Warm and dry NEUROLOGIC:  Alert and oriented x 3 PSYCHIATRIC:  Normal affect   ASSESSMENT:    1. Permanent atrial fibrillation ([place])   2. Chronic diastolic CHF (congestive heart failure) ([place])   3. HYPERTENSION, MILD   4. Tricuspid valve insufficiency, unspecified etiology   5. Pulmonary HTN ([place])    PLAN:    In order of problems listed above:   1.  Permanent atrial fibrillation -HR elevated on exam today -increase Toprol XL to 100mg  qam and 25mg  qpm for rate control  -continue warfarin -she had not had any bleeding issues and Hbg was 14.7 in January 2021  2.  Chronic diastolic CHF -appears euvolemic on exam and denies any DOE or LE edema -continue Lasix 20mg  daily -Creatinine was stable at 1.5 and K+ 3.5 in January 2021  3.  HTN -Bp borderline controlled -continue irbesartan 150mg  daily and increase Toprol XL to 100mg  qam and 25mg  qpm and followup with extender in 2 weeks  4.  Severe TR -2D echo 2019 with severe RVE and moderately reduced RVF, severe RAE -severe TR likely related to annular dilatation -given advanced age would not proceed with any surgical repair for TR  5.  Pulmonary HTN -PFTs were nondiagnostic and incomplete -VQ showed no PE -declined sleep study -PASP 89mmHg on echo 05/2018 -repeat 2D  echo  -continue Lasix 20mg  daily    Medication Adjustments/Labs and Tests Ordered: Current medicines are reviewed at length with the patient today.  Concerns regarding medicines are outlined above.  Orders Placed This Encounter  Procedures  . EKG 12-Lead   No orders of the defined types were placed in this encounter.   Signed, Lori Him, MD  05/14/2020 1:08 PM    [place]

## 2020-05-14 NOTE — Patient Instructions (Addendum)
Medication Instructions:  Your physician has recommended you make the following change in your medication:  1) INCREASE Toprol to 100 mg every morning and 25 mg in the evening.   *If you need a refill on your cardiac medications before your next appointment, please call your pharmacy*   Testing/Procedures: Your physician has requested that you have an echocardiogram. Echocardiography is a painless test that uses sound waves to create images of your heart. It provides your doctor with information about the size and shape of your heart and how well your heart's chambers and valves are working. This procedure takes approximately one hour. There are no restrictions for this procedure.  Follow-Up: At [facility], you and your health needs are our priority.  As part of our continuing mission to provide you with exceptional heart care, we have created designated Provider Care Teams.  These Care Teams include your primary Cardiologist (physician) and Advanced Practice Providers (APPs -  Physician Assistants and Nurse Practitioners) who all work together to provide you with the care you need, when you need it.  We recommend signing up for the patient portal called "MyChart".  Sign up information is provided on this After Visit Summary.  MyChart is used to connect with patients for Virtual Visits (Telemedicine).  Patients are able to view lab/test results, encounter notes, upcoming appointments, etc.  Non-urgent messages can be sent to your provider as well.   To learn more about what you can do with MyChart, go to [email or web address].    Your next appointment:   2-3 week(s)  The format for your next appointment:   Either In Person or Virtual  Provider:   Melina Copa, PA-C or Ermalinda Barrios, PA-C  Follow up with Dr. Radford Pax in 1 year.

## 2020-05-29 ENCOUNTER — Other Ambulatory Visit: Payer: Self-pay

## 2020-05-29 ENCOUNTER — Ambulatory Visit (INDEPENDENT_AMBULATORY_CARE_PROVIDER_SITE_OTHER): Payer: Medicare Other | Admitting: *Deleted

## 2020-05-29 DIAGNOSIS — I4891 Unspecified atrial fibrillation: Secondary | ICD-10-CM

## 2020-05-29 DIAGNOSIS — Z7901 Long term (current) use of anticoagulants: Secondary | ICD-10-CM

## 2020-05-29 DIAGNOSIS — Z5181 Encounter for therapeutic drug level monitoring: Secondary | ICD-10-CM | POA: Diagnosis not present

## 2020-05-29 DIAGNOSIS — I4821 Permanent atrial fibrillation: Secondary | ICD-10-CM

## 2020-05-29 LAB — POCT INR: INR: 1.7 — AB (ref 2.0–3.0)

## 2020-05-29 NOTE — Patient Instructions (Signed)
Description   Today take 1.5 tablets then continue taking same dosage 1 tablet every day. Recheck in 4 weeks. Call us any new medication change # [phone].

## 2020-06-04 ENCOUNTER — Ambulatory Visit (HOSPITAL_COMMUNITY): Payer: Medicare Other | Attending: Cardiovascular Disease

## 2020-06-04 ENCOUNTER — Other Ambulatory Visit: Payer: Self-pay

## 2020-06-04 DIAGNOSIS — I272 Pulmonary hypertension, unspecified: Secondary | ICD-10-CM

## 2020-06-15 ENCOUNTER — Other Ambulatory Visit: Payer: Medicare Other

## 2020-06-17 ENCOUNTER — Encounter: Payer: Self-pay | Admitting: Physician Assistant

## 2020-06-17 NOTE — Progress Notes (Signed)
Virtual Visit via Telephone Note   This visit type was conducted due to national recommendations for restrictions regarding the COVID-19 Pandemic (e.g. social distancing) in an effort to limit this patient's exposure and mitigate transmission in our community.  Due to her co-morbid illnesses, this patient is at least at moderate risk for complications without adequate follow up.  This format is felt to be most appropriate for this patient at this time.  The patient did not have access to video technology/had technical difficulties with video requiring transitioning to audio format only (telephone).  All issues noted in this document were discussed and addressed.  No physical exam could be performed with this format.  Please refer to the patient's chart for her  consent to telehealth for [facility]. The patient was identified using 2 identifiers.  Date:  06/18/2020   ID:  Lori Bridges, DOB Dec 25, 1930, MRN [id number]  Patient Location: Home Provider Location: Office  PCP:  Lori Late, MD  Cardiologist:  Lori Him, MD  Electrophysiologist:  None   Evaluation Performed:  Follow-Up Visit  Chief Complaint:  F/u atrial fib rate  History of Present Illness:    Lori Bridges is a 86 y.o. female with GERD, HTN, hyperlipidemia (lipids followed by PCP), permanent atrial fibrillation on chronic Coumadin, chronic diastolicCHF, pulmonary HTN with tricuspid regurgitation and RV volume overload, CKD stage III and hypercalcemia by labs who presents for virtual follow-up. Per Lori Bridges notes, she has a history of severe TR by echo with RV volume overload with mild RVE and moderate pulmonary HTN with PASP 40mmHg dating back to 2013. It has been felt that her LE edema is secondary to diastolic dysfunction, dietary indiscretion with sodium, RHF and Cardizem. Her Cardizem was changed to Toprol. Nuclear stress test in 2018 was normal. Initial workup for PHTN included ANA, sed rate and RF which  were normal. Sleep study was ordered but patient cancelled and PFTs were non-diagnostic and incomplete. Prior VQ was negative for PE. Event monitor in 01/2019 showed atrial fib with average HR 84bpm, occasional PVCs (0.44%). She saw Lori Bridges in follow-up 04/2020. Unfortunately she has had problems with worsening ataxic gait recently that was thought to be due to lorazepam she was taking to help her sleep at night. At her last OV with her PCP her HR was higher than usual. She has also had some problems with dizziness. She stopped taking Lorazepam during the day and her ataxia and dizziness resolved. Her HR was elevated, however, so Toprol was increased to 100mg  QAM and 25mg  QPM. (Of note, HR in 12/2019 was 118 by vitals then in 80s by EKG.) Last labs personally reviewed 05/07/2020 Na 145, K 3.6, elevated calcium of 10.7, Cr 1.14, glucose 110, TBili high at 2.5, otherwise normal AST/ALT, normal TSH, 03/2020 Hgb 14.4, Plt 125. 2d echo 06/04/20 as outlined below with EF 60-65%, moderate RV enlargement with moderate dysfunction, severe LAE/TR and mild MR.  She is seen virtually today via phone with the help of her daughter Lori Bridges. Lori Bridges does not offer any acute complaints. She and Lori Bridges feel she is tolerating the medication change well. They do not have access to her blood pressure cuff today, another sister has it. They are also uncomfortable trying to manually check her pulse. Lori Bridges says she did notice mild puffiness in her mom's legs a few days ago but this improved keeping them elevated.    Past Medical History:  Diagnosis Date  . Adenomatous colon polyp 2005  .  Anxiety age 63  . Carotid artery bruit 2001   left  . CKD (chronic kidney disease), stage III   . DDD (degenerative disc disease)   . DJD (degenerative joint disease)   . Gastroenteritis 2011  . GERD (gastroesophageal reflux disease) 2003  . History of pneumonia   . HTN (hypertension) 1983  . Hyperlipidemia 2000  . Hypothyroidism 2009    . Iron deficiency anemia 1983  . Osteoarthritis 2001  . Osteoporosis 2001  . Permanent atrial fibrillation ([place]) 07/23/2010       . Persistent headaches 1987  . Pulmonary HTN ([place]) 04/03/2017   Moderate with PASP 60mmHg by echo 02/2017  . Tricuspid regurgitation    severe by echo 02/25/12  . Vertigo   . Vitamin D deficiency 2011   Past Surgical History:  Procedure Laterality Date  . COLONOSCOPY W/ BIOPSIES AND POLYPECTOMY  09/16/2004   adenomatous polyp, diverticulosis, internal hemorrhoids  . s/p BLT    . TUBAL LIGATION       Current Meds  Medication Sig  . Cholecalciferol (VITAMIN D3) 2000 units TABS Take 2,000 Units by mouth daily.  . Cyanocobalamin (VITAMIN B-12 PO) Take 1 tablet by mouth daily.  . furosemide (LASIX) 20 MG tablet Take 1 tablet (20 mg total) by mouth daily.  . irbesartan (AVAPRO) 150 MG tablet Take 1 tablet (150 mg total) by mouth daily.  Marland Kitchen LORazepam (ATIVAN) 1 MG tablet Take 0.5 mg by mouth at bedtime.   . metoprolol succinate (TOPROL XL) 100 MG 24 hr tablet TAKE 1 TABLET(100 MG) BY MOUTH DAILY  . metoprolol succinate (TOPROL XL) 25 MG 24 hr tablet Take 1 tablet (25 mg total) by mouth at bedtime.  . pantoprazole (PROTONIX) 40 MG tablet Take 40 mg by mouth daily.   Marland Kitchen SYNTHROID 50 MCG tablet Take 1 tablet by mouth daily OES  . warfarin (COUMADIN) 2 MG tablet Take 1 tablet (2 mg total) by mouth daily. Or as directed by the coumadin clinic     Allergies:   Doxycycline, Ezetimibe, Meclizine, Pravastatin, Colestipol, Sulfamethoxazole, Ace inhibitors, Codeine, Colestipol hcl, Lovastatin, and Sulfa antibiotics   Social History   Tobacco Use  . Smoking status: Never Smoker  . Smokeless tobacco: Never Used  Substance Use Topics  . Alcohol use: No  . Drug use: No     Family Hx: The patient's family history includes Diabetes in her mother; Heart disease (age of onset: 41) in her brother; Hypertension in her father; Liver cancer in her sister; Peripheral vascular  disease in her father.  ROS:   Please see the history of present illness.    All other systems reviewed and are negative.   Prior CV studies:   The following studies were reviewed today:  2D echo 06/04/20 IMPRESSIONS    1. Left ventricular ejection fraction, by estimation, is 60 to 65%. The  left ventricle has normal function. The left ventricle has no regional  wall motion abnormalities. Left ventricular diastolic function could not  be evaluated.  2. Right ventricular systolic function is moderately reduced. The right  ventricular size is moderately enlarged. There is normal pulmonary artery  systolic pressure.  3. Left atrial size was severely dilated.  4. The mitral valve is normal in structure. Mild mitral valve  regurgitation. No evidence of mitral stenosis.  5. Tricuspid valve regurgitation is severe.  6. The aortic valve is normal in structure. Aortic valve regurgitation is  trivial. No aortic stenosis is present.  Labs/Other Tests and Data Reviewed:    EKG:  An ECG dated 05/14/20 was personally reviewed today and demonstrated:  atrial fib 95bpm low voltage QRS otherwise nonspecific changes    Recent Labs: 01/17/2020: BUN 12; Creatinine, Ser 1.51; Hemoglobin 14.7; Platelets 183; Potassium 3.5; Sodium 144   Recent Lipid Panel No results found for: CHOL, TRIG, HDL, CHOLHDL, LDLCALC, LDLDIRECT  Wt Readings from Last 3 Encounters:  06/18/20 133 lb (60.3 kg)  05/14/20 135 lb 9.6 oz (61.5 kg)  01/17/20 133 lb 12.8 oz (60.7 kg)     Objective:    Vital Signs:  Ht 5\' 6"  (1.676 m)   Wt 133 lb (60.3 kg)   BMI 21.47 kg/m    VS reviewed. General - calm F in no acute distress Pulm - No labored breathing, no coughing during visit, no audible wheezing, speaking in full sentences Neuro - A+Ox3, no slurred speech, answers questions appropriately Psych - Pleasant affect  ASSESSMENT & PLAN:    1. Permanent atrial fibrillation - recent elevated rates noted.  Unfortunately the visit is limited by lack of VS available. There is a daughter that will be coming over tomorrow who can obtain the vital signs. They were instructed to call us when they get that information and we will make decisions about next steps. For completeness we will also obtain a f/u CBC when she returns for her INR check next week. Hgb was normal in January when HR was also slightly elevated at that time. No bleeding reported. 2. Essential HTN - await vital reports from patient's family as above before advising further. 3. Chronic diastolic CHF with pulmonary HTN and tricuspid regurgitation - mild increase in edema recently, with resolution with elevation. No other changes made today. See Lori Bridges recent thorough note regarding assessment. Daughter will notify if any worsening edema. Patient denies dyspnea. 4. Hypercalcemia - noted on labs recently along with elevated bilirubin. Lori Bridges states mother saw nephrologist who said the type of Vit D she is on was OK and shouldn't be causing this. They are not aware of any other workup. Advised they see PCP to determine whether eval for hyperparathyroidism, etc. should be considered.  She is not really on any other medications that should contribute to this problem. They know to avoid calcium supplements and have reduced the cheese in her diet. The presence of difficult-to-control rates with atrial fib can sometimes signal an underlying metabolic problem rather than intrinsic cardiac issue, so this warrants further evaluation.  Time:   Today, I have spent 16 minutes with the patient with telehealth technology discussing the above problems.     Medication Adjustments/Labs and Tests Ordered: Current medicines are reviewed at length with the patient today.  Testing and concerns regarding medicines are outlined above.    Follow Up:  In 04/2021 with Lori Bridges, unless vital sign report from family dictates sooner follow-up is  needed.  Signed, Charlie Pitter, PA-C  06/18/2020 12:31 PM    [facility]

## 2020-06-18 ENCOUNTER — Other Ambulatory Visit: Payer: Self-pay

## 2020-06-18 ENCOUNTER — Encounter: Payer: Self-pay | Admitting: Physician Assistant

## 2020-06-18 ENCOUNTER — Telehealth: Payer: Self-pay

## 2020-06-18 ENCOUNTER — Telehealth (INDEPENDENT_AMBULATORY_CARE_PROVIDER_SITE_OTHER): Payer: Medicare Other | Admitting: Physician Assistant

## 2020-06-18 VITALS — Ht 66.0 in | Wt 133.0 lb

## 2020-06-18 DIAGNOSIS — I272 Pulmonary hypertension, unspecified: Secondary | ICD-10-CM | POA: Diagnosis not present

## 2020-06-18 DIAGNOSIS — I4821 Permanent atrial fibrillation: Secondary | ICD-10-CM | POA: Diagnosis not present

## 2020-06-18 DIAGNOSIS — I5032 Chronic diastolic (congestive) heart failure: Secondary | ICD-10-CM | POA: Diagnosis not present

## 2020-06-18 DIAGNOSIS — I10 Essential (primary) hypertension: Secondary | ICD-10-CM

## 2020-06-18 DIAGNOSIS — I071 Rheumatic tricuspid insufficiency: Secondary | ICD-10-CM

## 2020-06-18 NOTE — Patient Instructions (Addendum)
Medication Instructions:  Your physician recommends that you continue on your current medications as directed. Please refer to the Current Medication list given to you today.  *If you need a refill on your cardiac medications before your next appointment, please call your pharmacy*   Lab Work: 06/26/20 when you come in for a Couamdin check, go to the lab for:  CBC  But keep her Coumadin appt 6/29  If you have labs (blood work) drawn today and your tests are completely normal, you will receive your results only by: Marland Kitchen MyChart Message (if you have MyChart) OR . A paper copy in the mail If you have any lab test that is abnormal or we need to change your treatment, we will call you to review the results.   Testing/Procedures: None ordered   Follow-Up: At [facility], you and your health needs are our priority.  As part of our continuing mission to provide you with exceptional heart care, we have created designated Provider Care Teams.  These Care Teams include your primary Cardiologist (physician) and Advanced Practice Providers (APPs -  Physician Assistants and Nurse Practitioners) who all work together to provide you with the care you need, when you need it.  We recommend signing up for the patient portal called "MyChart".  Sign up information is provided on this After Visit Summary.  MyChart is used to connect with patients for Virtual Visits (Telemedicine).  Patients are able to view lab/test results, encounter notes, upcoming appointments, etc.  Non-urgent messages can be sent to your provider as well.   To learn more about what you can do with MyChart, go to [email or web address].    Your next appointment:   11 month(s)  The format for your next appointment:   In Person  Provider:   You may see Fransico Him, MD or one of the following Advanced Practice Providers on your designated Care Team:    Melina Copa, PA-C  Ermalinda Barrios, PA-C    Other Instructions Please make an  appt with your primary care doctor about your elevated calcium levels   Call us when you get a chance to get your vital signs (Blood pressure and heart rate)

## 2020-06-18 NOTE — Telephone Encounter (Signed)
[name] [name], consented to speaking with Dayna regarding her mother/jb

## 2020-06-19 ENCOUNTER — Telehealth: Payer: Self-pay | Admitting: Cardiology

## 2020-06-19 ENCOUNTER — Telehealth: Payer: Self-pay | Admitting: Physician Assistant

## 2020-06-19 NOTE — Telephone Encounter (Signed)
Thank you. Heart rate appears improved. I see in another phone note they called to find out what labs her PCP needs to order. She just needs to let her PCP know we have recommended further workup for hypercalcemia (elevated calcium) and they should take it from there. Since her BP still remains high I would recommend starting hydralazine 25mg  BID and following up with primary care as above. (For documentation sake, did not change metoprolol to carvedilol because she has achieved better HR control on present metoprolol dose - and she's already on max dose in the AM. I did not want to increase irbesartan due to CKD, and amlodipine is less ideal with h/o lower extremity edema). She will need f/u of her BP in approximately 1 week. She can either see her PCP for this since she needs to see him anyway for the calcium issue, or we can arrange a pharmacy visit in our office.  Khila Papp PA-C

## 2020-06-19 NOTE — Telephone Encounter (Signed)
   Pt c/o BP issue: STAT if pt c/o blurred vision, one-sided weakness or slurred speech  1. What are your last 5 BP readings?  10 am 158/97 HR 85 2 pm 146/98 HR 88  2. Are you having any other symptoms (ex. Dizziness, headache, blurred vision, passed out)? Just the swelling of her foot  3. What is your BP issue?  [name] is calling to give pt. BP. She said they were told by Anderson Malta to get pt's BP

## 2020-06-19 NOTE — Telephone Encounter (Signed)
New message   Per Sherri need to know what labs need be ordered and what the patient would need to see Dr. Weston Settle for. Please call to discuss.

## 2020-06-20 MED ORDER — HYDRALAZINE HCL 25 MG PO TABS
25.0000 mg | ORAL_TABLET | Freq: Two times a day (BID) | ORAL | 11 refills | Status: DC
Start: 2020-06-20 — End: 2020-07-26

## 2020-06-20 NOTE — Telephone Encounter (Signed)
Pt aware of recommendations and Hydralazine 25 mg bid sent to pharmacy and pt has a call out to PCP .Adonis Housekeeper

## 2020-06-20 NOTE — Telephone Encounter (Signed)
Harlow Ohms, Appointment Coordinator for Dr. Sandi Mariscal wanted someone from 65 office to to go over the visit notes from the Virtual Visit 06/18/20. Dr. Deboraha Sprang office needs to be on the same page as what Lisbeth Renshaw has to say. Please call to discuss visit notes. The office has two Sherri's that work in the office. Please ask for [employer].

## 2020-06-20 NOTE — Telephone Encounter (Signed)
I spoke with Sherri at [facility] office.  Fax machine in their office has been down and Sherri is requesting information from last office visit as to why patient needs follow up with Dr Sandi Mariscal.  Information from office visit from 6/21 and phone note from yesterday provided. Sherri requests office notes from 05/14/20 and 06/18/20 and telephone note from yesterday be mailed to Dr [facility] office.  Sherri will contact patient regarding appointment with Dr Sandi Mariscal.

## 2020-06-26 ENCOUNTER — Encounter: Payer: Self-pay | Admitting: *Deleted

## 2020-06-26 ENCOUNTER — Other Ambulatory Visit: Payer: Medicare Other

## 2020-06-26 ENCOUNTER — Other Ambulatory Visit: Payer: Self-pay

## 2020-06-26 ENCOUNTER — Ambulatory Visit (INDEPENDENT_AMBULATORY_CARE_PROVIDER_SITE_OTHER): Payer: Medicare Other | Admitting: *Deleted

## 2020-06-26 DIAGNOSIS — I4821 Permanent atrial fibrillation: Secondary | ICD-10-CM | POA: Diagnosis not present

## 2020-06-26 DIAGNOSIS — I10 Essential (primary) hypertension: Secondary | ICD-10-CM

## 2020-06-26 DIAGNOSIS — I272 Pulmonary hypertension, unspecified: Secondary | ICD-10-CM

## 2020-06-26 DIAGNOSIS — Z5181 Encounter for therapeutic drug level monitoring: Secondary | ICD-10-CM | POA: Diagnosis not present

## 2020-06-26 DIAGNOSIS — I5032 Chronic diastolic (congestive) heart failure: Secondary | ICD-10-CM

## 2020-06-26 DIAGNOSIS — Z7901 Long term (current) use of anticoagulants: Secondary | ICD-10-CM

## 2020-06-26 DIAGNOSIS — I4891 Unspecified atrial fibrillation: Secondary | ICD-10-CM | POA: Diagnosis not present

## 2020-06-26 DIAGNOSIS — I071 Rheumatic tricuspid insufficiency: Secondary | ICD-10-CM

## 2020-06-26 LAB — CBC
Hematocrit: 41.2 % (ref 34.0–46.6)
Hemoglobin: 13.9 g/dL (ref 11.1–15.9)
MCH: 29.2 pg (ref 26.6–33.0)
MCHC: 33.7 g/dL (ref 31.5–35.7)
MCV: 87 fL (ref 79–97)
Platelets: 177 10*3/uL (ref 150–450)
RBC: 4.76 x10E6/uL (ref 3.77–5.28)
RDW: 14.9 % (ref 11.7–15.4)
WBC: 4.6 10*3/uL (ref 3.4–10.8)

## 2020-06-26 LAB — POCT INR: INR: 1.9 — AB (ref 2.0–3.0)

## 2020-06-26 NOTE — Telephone Encounter (Signed)
This encounter was created in error - please disregard.

## 2020-06-26 NOTE — Patient Instructions (Signed)
Description   Today take 1.5 tablets then start taking 1 tablet every day except 1.5 tablets on Sundays.  Recheck in 3 weeks. Call us any new medication change # [phone].

## 2020-06-27 ENCOUNTER — Ambulatory Visit (HOSPITAL_COMMUNITY)
Admission: RE | Admit: 2020-06-27 | Discharge: 2020-06-27 | Disposition: A | Discharge status: Home / Self Care | Payer: Medicare Other | Source: Ambulatory Visit | Attending: Nurse Practitioner | Admitting: Nurse Practitioner

## 2020-06-27 ENCOUNTER — Telehealth: Payer: Self-pay | Admitting: *Deleted

## 2020-06-27 VITALS — BP 130/60 | HR 102 | Ht 66.0 in | Wt 142.8 lb

## 2020-06-27 DIAGNOSIS — Z885 Allergy status to narcotic agent status: Secondary | ICD-10-CM | POA: Diagnosis not present

## 2020-06-27 DIAGNOSIS — Z79899 Other long term (current) drug therapy: Secondary | ICD-10-CM | POA: Diagnosis not present

## 2020-06-27 DIAGNOSIS — Z888 Allergy status to other drugs, medicaments and biological substances status: Secondary | ICD-10-CM | POA: Insufficient documentation

## 2020-06-27 DIAGNOSIS — D6869 Other thrombophilia: Secondary | ICD-10-CM

## 2020-06-27 DIAGNOSIS — I130 Hypertensive heart and chronic kidney disease with heart failure and stage 1 through stage 4 chronic kidney disease, or unspecified chronic kidney disease: Secondary | ICD-10-CM | POA: Insufficient documentation

## 2020-06-27 DIAGNOSIS — I071 Rheumatic tricuspid insufficiency: Secondary | ICD-10-CM | POA: Diagnosis not present

## 2020-06-27 DIAGNOSIS — I272 Pulmonary hypertension, unspecified: Secondary | ICD-10-CM | POA: Insufficient documentation

## 2020-06-27 DIAGNOSIS — Z881 Allergy status to other antibiotic agents status: Secondary | ICD-10-CM | POA: Diagnosis not present

## 2020-06-27 DIAGNOSIS — I509 Heart failure, unspecified: Secondary | ICD-10-CM | POA: Diagnosis not present

## 2020-06-27 DIAGNOSIS — Z86010 Personal history of colonic polyps: Secondary | ICD-10-CM | POA: Insufficient documentation

## 2020-06-27 DIAGNOSIS — N183 Chronic kidney disease, stage 3 unspecified: Secondary | ICD-10-CM | POA: Insufficient documentation

## 2020-06-27 DIAGNOSIS — Z8719 Personal history of other diseases of the digestive system: Secondary | ICD-10-CM | POA: Diagnosis not present

## 2020-06-27 DIAGNOSIS — Z9851 Tubal ligation status: Secondary | ICD-10-CM | POA: Insufficient documentation

## 2020-06-27 DIAGNOSIS — K219 Gastro-esophageal reflux disease without esophagitis: Secondary | ICD-10-CM | POA: Diagnosis not present

## 2020-06-27 DIAGNOSIS — I4821 Permanent atrial fibrillation: Secondary | ICD-10-CM | POA: Insufficient documentation

## 2020-06-27 DIAGNOSIS — Z7901 Long term (current) use of anticoagulants: Secondary | ICD-10-CM | POA: Insufficient documentation

## 2020-06-27 DIAGNOSIS — Z8701 Personal history of pneumonia (recurrent): Secondary | ICD-10-CM | POA: Insufficient documentation

## 2020-06-27 DIAGNOSIS — I4891 Unspecified atrial fibrillation: Secondary | ICD-10-CM

## 2020-06-27 DIAGNOSIS — Z8249 Family history of ischemic heart disease and other diseases of the circulatory system: Secondary | ICD-10-CM | POA: Diagnosis not present

## 2020-06-27 MED ORDER — METOPROLOL SUCCINATE ER 25 MG PO TB24
50.0000 mg | ORAL_TABLET | Freq: Every day | ORAL | 3 refills | Status: DC
Start: 2020-06-27 — End: 2020-07-13

## 2020-06-27 NOTE — Progress Notes (Signed)
Primary Care Physician: Derinda Late, MD Referring Physician: Dr. Jim Like Lori Bridges is a 86 y.o. female with a h/o permanent afib, on coumadin for a CHA2DS2VASc score of at least 5, HTN, CHF, Pulmonary HTN, tricuspid regurgitation that is in the afib clinic for recent issues with rate control and hypertension. Last med that was added was hydralazine 25 mg bid. She is on  toprol xl 125 mg at bedtime and  25 mg in the am. She was told earlier today to change toprol to 100 mg in the am and 50 mg in the pm. She said when she went to the doctor visit yesterday ath she had to walk a long distance and that is why she feels her BP/HR were up yesterday. Today in the office, BP 130/60 and afib at 100 bpm.  Today, she denies symptoms of palpitations, chest pain, shortness of breath, orthopnea, PND, lower extremity edema, dizziness, presyncope, syncope, or neurologic sequela. The patient is tolerating medications without difficulties and is otherwise without complaint today.   Past Medical History:  Diagnosis Date  . Adenomatous colon polyp 2005  . Anxiety age 69  . Carotid artery bruit 2001   left  . CKD (chronic kidney disease), stage III   . DDD (degenerative disc disease)   . DJD (degenerative joint disease)   . Gastroenteritis 2011  . GERD (gastroesophageal reflux disease) 2003  . History of pneumonia   . HTN (hypertension) 1983  . Hyperlipidemia 2000  . Hypothyroidism 2009  . Iron deficiency anemia 1983  . Osteoarthritis 2001  . Osteoporosis 2001  . Permanent atrial fibrillation ([place]) 07/23/2010       . Persistent headaches 1987  . Pulmonary HTN ([place]) 04/03/2017   Moderate with PASP 58mmHg by echo 02/2017  . Tricuspid regurgitation    severe by echo 02/25/12  . Vertigo   . Vitamin D deficiency 2011   Past Surgical History:  Procedure Laterality Date  . COLONOSCOPY W/ BIOPSIES AND POLYPECTOMY  09/16/2004   adenomatous polyp, diverticulosis, internal hemorrhoids  . s/p BLT      . TUBAL LIGATION      Current Outpatient Medications  Medication Sig Dispense Refill  . Cholecalciferol (VITAMIN D3) 2000 units TABS Take 2,000 Units by mouth daily.    . Cyanocobalamin (VITAMIN B-12 PO) Take 1 tablet by mouth daily.    . furosemide (LASIX) 20 MG tablet Take 1 tablet (20 mg total) by mouth daily. 90 tablet 3  . hydrALAZINE (APRESOLINE) 25 MG tablet Take 1 tablet (25 mg total) by mouth in the morning and at bedtime. 60 tablet 11  . irbesartan (AVAPRO) 150 MG tablet Take 1 tablet (150 mg total) by mouth daily. 30 tablet 11  . LORazepam (ATIVAN) 1 MG tablet Take 0.5 mg by mouth at bedtime.     . metoprolol succinate (TOPROL XL) 100 MG 24 hr tablet TAKE 1 TABLET(100 MG) BY MOUTH DAILY 90 tablet 1  . metoprolol succinate (TOPROL XL) 25 MG 24 hr tablet Take 2 tablets (50 mg total) by mouth at bedtime. 60 tablet 3  . pantoprazole (PROTONIX) 40 MG tablet Take 40 mg by mouth daily.     Marland Kitchen SYNTHROID 50 MCG tablet Take 1 tablet by mouth daily OES  8  . warfarin (COUMADIN) 2 MG tablet Take 1 tablet (2 mg total) by mouth daily. Or as directed by the coumadin clinic 35 tablet 2   No current facility-administered medications for this encounter.  Allergies  Allergen Reactions  . Doxycycline Other (See Comments)    UNKNOWN unknown  . Ezetimibe Other (See Comments)    Arthralgia, Myalgias  . Meclizine Other (See Comments) and Palpitations    Tachycardia  . Pravastatin Other (See Comments)    Arthralgia, Myalgias  . Colestipol Other (See Comments)  . Sulfamethoxazole Other (See Comments)    unknown  . Ace Inhibitors Cough  . Codeine Nausea Only  . Colestipol Hcl Other (See Comments)    Malaise  . Lovastatin Other (See Comments)    UNKNOWN Unknown  . Sulfa Antibiotics Other (See Comments)    unknown    Social History   Socioeconomic History  . Marital status: Widowed    Spouse name: Not on file  . Number of children: 7  . Years of education: Not on file  . Highest  education level: Not on file  Occupational History  . Occupation: Retired    Comment: Production assistant, radio  Tobacco Use  . Smoking status: Never Smoker  . Smokeless tobacco: Never Used  Substance and Sexual Activity  . Alcohol use: No  . Drug use: No  . Sexual activity: Not on file  Other Topics Concern  . Not on file  Social History Narrative  . Not on file   Social Determinants of Health   Financial Resource Strain:   . Difficulty of Paying Living Expenses:   Food Insecurity:   . Worried About Charity fundraiser in the Last Year:   . Arboriculturist in the Last Year:   Transportation Needs:   . Film/video editor (Medical):   Marland Kitchen Lack of Transportation (Non-Medical):   Physical Activity:   . Days of Exercise per Week:   . Minutes of Exercise per Session:   Stress:   . Feeling of Stress :   Social Connections:   . Frequency of Communication with Friends and Family:   . Frequency of Social Gatherings with Friends and Family:   . Attends Religious Services:   . Active Member of Clubs or Organizations:   . Attends Archivist Meetings:   Marland Kitchen Marital Status:   Intimate Partner Violence:   . Fear of Current or Ex-Partner:   . Emotionally Abused:   Marland Kitchen Physically Abused:   . Sexually Abused:     Family History  Problem Relation Age of Onset  . Hypertension Father   . Peripheral vascular disease Father   . Diabetes Mother   . Heart disease Brother 12  . Liver cancer Sister     ROS- All systems are reviewed and negative except as per the HPI above  Physical Exam: There were no vitals filed for this visit. Wt Readings from Last 3 Encounters:  06/18/20 60.3 kg  05/14/20 61.5 kg  01/17/20 60.7 kg    Labs: Lab Results  Component Value Date   NA 144 01/17/2020   K 3.5 01/17/2020   CL 100 01/17/2020   CO2 30 (H) 01/17/2020   GLUCOSE 78 01/17/2020   BUN 12 01/17/2020   CREATININE 1.51 (H) 01/17/2020   CALCIUM 11.2 (H) 01/17/2020   MG 2.0 02/16/2015   Lab  Results  Component Value Date   INR 1.9 (A) 06/26/2020   No results found for: CHOL, HDL, LDLCALC, TRIG   GEN- The patient is well appearing, alert and oriented x 3 today.   Head- normocephalic, atraumatic Eyes-  Sclera clear, conjunctiva pink Ears- hearing intact Oropharynx- clear Neck- supple, no JVP  Lymph- no cervical lymphadenopathy Lungs- Clear to ausculation bilaterally, normal work of breathing Heart- irregular rate and rhythm, no murmurs, rubs or gallops, PMI not laterally displaced GI- soft, NT, ND, + BS Extremities- no clubbing, cyanosis, or edema MS- no significant deformity or atrophy Skin- no rash or lesion Psych- euthymic mood, full affect Neuro- strength and sensation are intact  EKG-afib at 102 bpm    Assessment and Plan: 1. Permanent afib Reasonably controlled for age and sedentary lifestyle She will start on toprol xl 100 mg am and 50 mg pm as she was directed this am  2. HTN Stable today  No changes other than above   3. CHA2DS2VASc score of at least 5 Continue warfarin   I will see back in one week   Butch Penny C. Rylyn Zawistowski, [facility] [address] [place], [place] [address] [phone]

## 2020-06-27 NOTE — Telephone Encounter (Signed)
-----   Message from Charlie Pitter, [place] sent at 06/26/2020  4:53 PM EDT ----- Please let pt know CBC is normal. Candance saw her in Coumadin clinic today and relayed the following vitals to me: I nitial blood pressure 151/101 P 118 and after having her to totally relax it was 152/93. Candance went over med list and verified it was correct. Would recommend to continue hydralazine for now, increase Toprol to 100mg  QAM/50mg  QPM with early follow-up in atrial fib clinic this week if possible to help get HR under better control. Bring pills to appt. Needs to be in-person visit because family did not have way to check VS by virtual visit last time.

## 2020-06-27 NOTE — Addendum Note (Signed)
Addended by: Gaetano Net on: 06/27/2020 08:23 AM   Modules accepted: Orders

## 2020-06-27 NOTE — Patient Instructions (Signed)
Metoprolol 100mg  in the morning and 50mg  in the evening

## 2020-07-04 ENCOUNTER — Encounter (HOSPITAL_COMMUNITY): Payer: Self-pay | Admitting: Nurse Practitioner

## 2020-07-04 ENCOUNTER — Ambulatory Visit (HOSPITAL_COMMUNITY)
Admission: RE | Admit: 2020-07-04 | Discharge: 2020-07-04 | Disposition: A | Discharge status: Home / Self Care | Payer: Medicare Other | Source: Ambulatory Visit | Attending: Nurse Practitioner | Admitting: Nurse Practitioner

## 2020-07-04 ENCOUNTER — Other Ambulatory Visit: Payer: Self-pay

## 2020-07-04 VITALS — BP 150/96 | HR 97 | Ht 66.0 in | Wt 143.0 lb

## 2020-07-04 DIAGNOSIS — Z882 Allergy status to sulfonamides status: Secondary | ICD-10-CM | POA: Diagnosis not present

## 2020-07-04 DIAGNOSIS — E785 Hyperlipidemia, unspecified: Secondary | ICD-10-CM | POA: Diagnosis not present

## 2020-07-04 DIAGNOSIS — Z7901 Long term (current) use of anticoagulants: Secondary | ICD-10-CM | POA: Insufficient documentation

## 2020-07-04 DIAGNOSIS — D6869 Other thrombophilia: Secondary | ICD-10-CM

## 2020-07-04 DIAGNOSIS — I509 Heart failure, unspecified: Secondary | ICD-10-CM | POA: Diagnosis not present

## 2020-07-04 DIAGNOSIS — Z8701 Personal history of pneumonia (recurrent): Secondary | ICD-10-CM | POA: Diagnosis not present

## 2020-07-04 DIAGNOSIS — I071 Rheumatic tricuspid insufficiency: Secondary | ICD-10-CM | POA: Insufficient documentation

## 2020-07-04 DIAGNOSIS — Z86010 Personal history of colonic polyps: Secondary | ICD-10-CM | POA: Insufficient documentation

## 2020-07-04 DIAGNOSIS — Z881 Allergy status to other antibiotic agents status: Secondary | ICD-10-CM | POA: Insufficient documentation

## 2020-07-04 DIAGNOSIS — Z79890 Hormone replacement therapy (postmenopausal): Secondary | ICD-10-CM | POA: Diagnosis not present

## 2020-07-04 DIAGNOSIS — M810 Age-related osteoporosis without current pathological fracture: Secondary | ICD-10-CM | POA: Diagnosis not present

## 2020-07-04 DIAGNOSIS — I4821 Permanent atrial fibrillation: Secondary | ICD-10-CM | POA: Diagnosis present

## 2020-07-04 DIAGNOSIS — E039 Hypothyroidism, unspecified: Secondary | ICD-10-CM | POA: Insufficient documentation

## 2020-07-04 DIAGNOSIS — Z79899 Other long term (current) drug therapy: Secondary | ICD-10-CM | POA: Diagnosis not present

## 2020-07-04 DIAGNOSIS — Z888 Allergy status to other drugs, medicaments and biological substances status: Secondary | ICD-10-CM | POA: Insufficient documentation

## 2020-07-04 DIAGNOSIS — I272 Pulmonary hypertension, unspecified: Secondary | ICD-10-CM | POA: Insufficient documentation

## 2020-07-04 DIAGNOSIS — Z8249 Family history of ischemic heart disease and other diseases of the circulatory system: Secondary | ICD-10-CM | POA: Insufficient documentation

## 2020-07-04 DIAGNOSIS — I130 Hypertensive heart and chronic kidney disease with heart failure and stage 1 through stage 4 chronic kidney disease, or unspecified chronic kidney disease: Secondary | ICD-10-CM | POA: Insufficient documentation

## 2020-07-04 DIAGNOSIS — N183 Chronic kidney disease, stage 3 unspecified: Secondary | ICD-10-CM | POA: Diagnosis not present

## 2020-07-04 DIAGNOSIS — K219 Gastro-esophageal reflux disease without esophagitis: Secondary | ICD-10-CM | POA: Diagnosis not present

## 2020-07-04 DIAGNOSIS — M1990 Unspecified osteoarthritis, unspecified site: Secondary | ICD-10-CM | POA: Diagnosis not present

## 2020-07-04 NOTE — Progress Notes (Signed)
Primary Care Physician: Derinda Late, MD Referring Physician: Dr. Jim Like Lori Bridges is a 86 y.o. female with a h/o permanent afib, on coumadin for a CHA2DS2VASc score of at least 5, HTN, CHF, Pulmonary HTN, tricuspid regurgitation that is in the afib clinic for recent issues with rate control and hypertension. Last med that was added was hydralazine 25 mg bid. She is on  toprol xl 125 mg at bedtime and  25 mg in the am. She was told earlier today to change toprol to 100 mg in the am and 50 mg in the pm. She said when she went to the doctor visit yesterday ath she had to walk a long distance and that is why she feels her BP/HR were up yesterday. Today in the office, BP 130/60 and afib at 100 bpm.  F/u in the afib clinic 7/7. She increased her Toprol to 100 mg in the am and 50 mg in the pm. She  is rate controlled, BP slightly elevated at 150/96 but at home he daughter states that her BP is running less thatn 616 mg systolic and her HR's have been mostly in the 8o's. She has some LLE which is chronic and at her baseline. She feels well today.   Today, she denies symptoms of palpitations, chest pain, shortness of breath, orthopnea, PND, lower extremity edema, dizziness, presyncope, syncope, or neurologic sequela. The patient is tolerating medications without difficulties and is otherwise without complaint today.   Past Medical History:  Diagnosis Date  . Adenomatous colon polyp 2005  . Anxiety age 79  . Carotid artery bruit 2001   left  . CKD (chronic kidney disease), stage III   . DDD (degenerative disc disease)   . DJD (degenerative joint disease)   . Gastroenteritis 2011  . GERD (gastroesophageal reflux disease) 2003  . History of pneumonia   . HTN (hypertension) 1983  . Hyperlipidemia 2000  . Hypothyroidism 2009  . Iron deficiency anemia 1983  . Osteoarthritis 2001  . Osteoporosis 2001  . Permanent atrial fibrillation ([place]) 07/23/2010       . Persistent headaches 1987  .  Pulmonary HTN ([place]) 04/03/2017   Moderate with PASP 21mmHg by echo 02/2017  . Tricuspid regurgitation    severe by echo 02/25/12  . Vertigo   . Vitamin D deficiency 2011   Past Surgical History:  Procedure Laterality Date  . COLONOSCOPY W/ BIOPSIES AND POLYPECTOMY  09/16/2004   adenomatous polyp, diverticulosis, internal hemorrhoids  . s/p BLT    . TUBAL LIGATION      Current Outpatient Medications  Medication Sig Dispense Refill  . Cholecalciferol (VITAMIN D3) 2000 units TABS Take 2,000 Units by mouth daily.    . Cyanocobalamin (VITAMIN B-12 PO) Take 1 tablet by mouth daily.    . furosemide (LASIX) 20 MG tablet Take 1 tablet (20 mg total) by mouth daily. 90 tablet 3  . hydrALAZINE (APRESOLINE) 25 MG tablet Take 1 tablet (25 mg total) by mouth in the morning and at bedtime. 60 tablet 11  . irbesartan (AVAPRO) 150 MG tablet Take 1 tablet (150 mg total) by mouth daily. 30 tablet 11  . LORazepam (ATIVAN) 1 MG tablet Take 0.5 mg by mouth at bedtime.     . metoprolol succinate (TOPROL XL) 100 MG 24 hr tablet TAKE 1 TABLET(100 MG) BY MOUTH DAILY 90 tablet 1  . metoprolol succinate (TOPROL XL) 25 MG 24 hr tablet Take 2 tablets (50 mg total) by mouth  at bedtime. 60 tablet 3  . pantoprazole (PROTONIX) 40 MG tablet Take 40 mg by mouth daily.     Marland Kitchen SYNTHROID 50 MCG tablet Take 1 tablet by mouth daily OES  8  . warfarin (COUMADIN) 2 MG tablet Take 1 tablet (2 mg total) by mouth daily. Or as directed by the coumadin clinic 35 tablet 2   No current facility-administered medications for this encounter.    Allergies  Allergen Reactions  . Doxycycline Other (See Comments)    UNKNOWN unknown  . Ezetimibe Other (See Comments)    Arthralgia, Myalgias  . Meclizine Other (See Comments) and Palpitations    Tachycardia  . Pravastatin Other (See Comments)    Arthralgia, Myalgias  . Colestipol Other (See Comments)  . Sulfamethoxazole Other (See Comments)    unknown  . Ace Inhibitors Cough  . Codeine  Nausea Only  . Colestipol Hcl Other (See Comments)    Malaise  . Lovastatin Other (See Comments)    UNKNOWN Unknown  . Sulfa Antibiotics Other (See Comments)    unknown    Social History   Socioeconomic History  . Marital status: Widowed    Spouse name: Not on file  . Number of children: 7  . Years of education: Not on file  . Highest education level: Not on file  Occupational History  . Occupation: Retired    Comment: Production assistant, radio  Tobacco Use  . Smoking status: Never Smoker  . Smokeless tobacco: Never Used  Substance and Sexual Activity  . Alcohol use: No  . Drug use: No  . Sexual activity: Not on file  Other Topics Concern  . Not on file  Social History Narrative  . Not on file   Social Determinants of Health   Financial Resource Strain:   . Difficulty of Paying Living Expenses:   Food Insecurity:   . Worried About Charity fundraiser in the Last Year:   . Arboriculturist in the Last Year:   Transportation Needs:   . Film/video editor (Medical):   Marland Kitchen Lack of Transportation (Non-Medical):   Physical Activity:   . Days of Exercise per Week:   . Minutes of Exercise per Session:   Stress:   . Feeling of Stress :   Social Connections:   . Frequency of Communication with Friends and Family:   . Frequency of Social Gatherings with Friends and Family:   . Attends Religious Services:   . Active Member of Clubs or Organizations:   . Attends Archivist Meetings:   Marland Kitchen Marital Status:   Intimate Partner Violence:   . Fear of Current or Ex-Partner:   . Emotionally Abused:   Marland Kitchen Physically Abused:   . Sexually Abused:     Family History  Problem Relation Age of Onset  . Hypertension Father   . Peripheral vascular disease Father   . Diabetes Mother   . Heart disease Brother 78  . Liver cancer Sister     ROS- All systems are reviewed and negative except as per the HPI above  Physical Exam: Vitals:   07/04/20 1514  BP: (!) 150/96  Pulse: 97    Weight: 64.9 kg  Height: 5\' 6"  (1.676 m)   Wt Readings from Last 3 Encounters:  07/04/20 64.9 kg  06/27/20 64.8 kg  06/18/20 60.3 kg    Labs: Lab Results  Component Value Date   NA 144 01/17/2020   K 3.5 01/17/2020   CL 100 01/17/2020  CO2 30 (H) 01/17/2020   GLUCOSE 78 01/17/2020   BUN 12 01/17/2020   CREATININE 1.51 (H) 01/17/2020   CALCIUM 11.2 (H) 01/17/2020   MG 2.0 02/16/2015   Lab Results  Component Value Date   INR 1.9 (A) 06/26/2020   No results found for: CHOL, HDL, LDLCALC, TRIG   GEN- The patient is well appearing, alert and oriented x 3 today.   Head- normocephalic, atraumatic Eyes-  Sclera clear, conjunctiva pink Ears- hearing intact Oropharynx- clear Neck- supple, no JVP Lymph- no cervical lymphadenopathy Lungs- Clear to ausculation bilaterally, normal work of breathing Heart- irregular rate and rhythm, no murmurs, rubs or gallops, PMI not laterally displaced GI- soft, NT, ND, + BS Extremities- no clubbing, cyanosis, or edema MS- no significant deformity or atrophy Skin- no rash or lesion Psych- euthymic mood, full affect Neuro- strength and sensation are intact  EKG-afib at 97  bpm  Assessment and Plan: 1. Permanent afib Reasonably controlled for age and sedentary lifestyle Continue on toprol xl 100 mg am and 50 mg pm as she was directed this am  2. HTN Stable  No changes today   3. CHA2DS2VASc score of at least 5 Continue warfarin   F/u per Dr. Lajoyce Corners C. Christohper Dube, [facility] [address] [place], [place] [address] [phone]

## 2020-07-13 ENCOUNTER — Telehealth: Payer: Self-pay | Admitting: Physician Assistant

## 2020-07-13 MED ORDER — METOPROLOL SUCCINATE ER 100 MG PO TB24: ORAL_TABLET | ORAL | 3 refills | Status: DC

## 2020-07-13 MED ORDER — METOPROLOL SUCCINATE ER 25 MG PO TB24: 50.0000 mg | ORAL_TABLET | Freq: Every day | ORAL | 3 refills | Status: DC

## 2020-07-13 NOTE — Telephone Encounter (Signed)
   *  STAT* If patient is at the pharmacy, call can be transferred to refill team.   1. Which medications need to be refilled? (please list name of each medication and dose if known)   metoprolol succinate (TOPROL XL) 25 MG 24 hr tablet  Take 2 tablets (50 mg total) by mouth at bedtime.  2. Which pharmacy/location (including street and city if local pharmacy) is medication to be sent to? [facility] [place], [place]  3. Do they need a 30 day or 90 day supply? 90 days

## 2020-07-13 NOTE — Telephone Encounter (Signed)
Pt's medication was sent to pt's pharmacy as requested. Confirmation received.  °

## 2020-07-17 ENCOUNTER — Ambulatory Visit (INDEPENDENT_AMBULATORY_CARE_PROVIDER_SITE_OTHER): Payer: Medicare Other

## 2020-07-17 ENCOUNTER — Other Ambulatory Visit: Payer: Self-pay

## 2020-07-17 DIAGNOSIS — Z7901 Long term (current) use of anticoagulants: Secondary | ICD-10-CM

## 2020-07-17 DIAGNOSIS — I4891 Unspecified atrial fibrillation: Secondary | ICD-10-CM | POA: Diagnosis not present

## 2020-07-17 DIAGNOSIS — Z5181 Encounter for therapeutic drug level monitoring: Secondary | ICD-10-CM | POA: Diagnosis not present

## 2020-07-17 DIAGNOSIS — I4821 Permanent atrial fibrillation: Secondary | ICD-10-CM

## 2020-07-17 LAB — POCT INR: INR: 2.4 (ref 2.0–3.0)

## 2020-07-17 NOTE — Patient Instructions (Signed)
Description   Continue on same dosage 1 tablet every day except 1.5 tablets on Sundays.  Recheck in 4 weeks. Call us any new medication change # [phone].

## 2020-07-22 ENCOUNTER — Inpatient Hospital Stay (HOSPITAL_COMMUNITY): Payer: Medicare Other

## 2020-07-22 ENCOUNTER — Encounter (HOSPITAL_COMMUNITY): Payer: Self-pay

## 2020-07-22 ENCOUNTER — Inpatient Hospital Stay (HOSPITAL_COMMUNITY)
Admission: EM | Admit: 2020-07-22 | Discharge: 2020-07-26 | DRG: 064 | Disposition: A | Discharge status: Skilled Nursing Facility | Payer: Medicare Other | Attending: Neurology | Admitting: Neurology

## 2020-07-22 ENCOUNTER — Emergency Department (HOSPITAL_COMMUNITY): Payer: Medicare Other

## 2020-07-22 DIAGNOSIS — R1312 Dysphagia, oropharyngeal phase: Secondary | ICD-10-CM | POA: Diagnosis not present

## 2020-07-22 DIAGNOSIS — F419 Anxiety disorder, unspecified: Secondary | ICD-10-CM | POA: Diagnosis present

## 2020-07-22 DIAGNOSIS — G8191 Hemiplegia, unspecified affecting right dominant side: Secondary | ICD-10-CM | POA: Diagnosis present

## 2020-07-22 DIAGNOSIS — Z882 Allergy status to sulfonamides status: Secondary | ICD-10-CM | POA: Diagnosis not present

## 2020-07-22 DIAGNOSIS — Z881 Allergy status to other antibiotic agents status: Secondary | ICD-10-CM

## 2020-07-22 DIAGNOSIS — R1310 Dysphagia, unspecified: Secondary | ICD-10-CM | POA: Diagnosis present

## 2020-07-22 DIAGNOSIS — I071 Rheumatic tricuspid insufficiency: Secondary | ICD-10-CM | POA: Diagnosis present

## 2020-07-22 DIAGNOSIS — I69120 Aphasia following nontraumatic intracerebral hemorrhage: Secondary | ICD-10-CM | POA: Diagnosis not present

## 2020-07-22 DIAGNOSIS — I5032 Chronic diastolic (congestive) heart failure: Secondary | ICD-10-CM | POA: Diagnosis present

## 2020-07-22 DIAGNOSIS — Z7901 Long term (current) use of anticoagulants: Secondary | ICD-10-CM | POA: Diagnosis not present

## 2020-07-22 DIAGNOSIS — E785 Hyperlipidemia, unspecified: Secondary | ICD-10-CM | POA: Diagnosis present

## 2020-07-22 DIAGNOSIS — I161 Hypertensive emergency: Secondary | ICD-10-CM | POA: Diagnosis present

## 2020-07-22 DIAGNOSIS — G9349 Other encephalopathy: Secondary | ICD-10-CM | POA: Diagnosis present

## 2020-07-22 DIAGNOSIS — Z8701 Personal history of pneumonia (recurrent): Secondary | ICD-10-CM

## 2020-07-22 DIAGNOSIS — I480 Paroxysmal atrial fibrillation: Secondary | ICD-10-CM | POA: Diagnosis not present

## 2020-07-22 DIAGNOSIS — I272 Pulmonary hypertension, unspecified: Secondary | ICD-10-CM | POA: Diagnosis present

## 2020-07-22 DIAGNOSIS — N3000 Acute cystitis without hematuria: Secondary | ICD-10-CM | POA: Diagnosis not present

## 2020-07-22 DIAGNOSIS — E870 Hyperosmolality and hypernatremia: Secondary | ICD-10-CM | POA: Diagnosis present

## 2020-07-22 DIAGNOSIS — I69391 Dysphagia following cerebral infarction: Secondary | ICD-10-CM

## 2020-07-22 DIAGNOSIS — Z8249 Family history of ischemic heart disease and other diseases of the circulatory system: Secondary | ICD-10-CM | POA: Diagnosis not present

## 2020-07-22 DIAGNOSIS — K219 Gastro-esophageal reflux disease without esophagitis: Secondary | ICD-10-CM | POA: Diagnosis present

## 2020-07-22 DIAGNOSIS — Z833 Family history of diabetes mellitus: Secondary | ICD-10-CM

## 2020-07-22 DIAGNOSIS — Z888 Allergy status to other drugs, medicaments and biological substances status: Secondary | ICD-10-CM

## 2020-07-22 DIAGNOSIS — Z885 Allergy status to narcotic agent status: Secondary | ICD-10-CM

## 2020-07-22 DIAGNOSIS — R569 Unspecified convulsions: Secondary | ICD-10-CM

## 2020-07-22 DIAGNOSIS — B9620 Unspecified Escherichia coli [E. coli] as the cause of diseases classified elsewhere: Secondary | ICD-10-CM | POA: Diagnosis present

## 2020-07-22 DIAGNOSIS — E039 Hypothyroidism, unspecified: Secondary | ICD-10-CM | POA: Diagnosis present

## 2020-07-22 DIAGNOSIS — Z800 Family history of malignant neoplasm of digestive organs: Secondary | ICD-10-CM | POA: Diagnosis not present

## 2020-07-22 DIAGNOSIS — E7800 Pure hypercholesterolemia, unspecified: Secondary | ICD-10-CM | POA: Diagnosis not present

## 2020-07-22 DIAGNOSIS — N390 Urinary tract infection, site not specified: Secondary | ICD-10-CM | POA: Diagnosis not present

## 2020-07-22 DIAGNOSIS — I611 Nontraumatic intracerebral hemorrhage in hemisphere, cortical: Principal | ICD-10-CM | POA: Diagnosis present

## 2020-07-22 DIAGNOSIS — I610 Nontraumatic intracerebral hemorrhage in hemisphere, subcortical: Secondary | ICD-10-CM | POA: Diagnosis not present

## 2020-07-22 DIAGNOSIS — R609 Edema, unspecified: Secondary | ICD-10-CM | POA: Diagnosis not present

## 2020-07-22 DIAGNOSIS — I10 Essential (primary) hypertension: Secondary | ICD-10-CM | POA: Diagnosis present

## 2020-07-22 DIAGNOSIS — G936 Cerebral edema: Secondary | ICD-10-CM | POA: Diagnosis present

## 2020-07-22 DIAGNOSIS — I619 Nontraumatic intracerebral hemorrhage, unspecified: Secondary | ICD-10-CM | POA: Diagnosis present

## 2020-07-22 DIAGNOSIS — I482 Chronic atrial fibrillation, unspecified: Secondary | ICD-10-CM | POA: Diagnosis not present

## 2020-07-22 DIAGNOSIS — N183 Chronic kidney disease, stage 3 unspecified: Secondary | ICD-10-CM | POA: Diagnosis present

## 2020-07-22 DIAGNOSIS — E559 Vitamin D deficiency, unspecified: Secondary | ICD-10-CM | POA: Diagnosis present

## 2020-07-22 DIAGNOSIS — I130 Hypertensive heart and chronic kidney disease with heart failure and stage 1 through stage 4 chronic kidney disease, or unspecified chronic kidney disease: Secondary | ICD-10-CM | POA: Diagnosis present

## 2020-07-22 DIAGNOSIS — I129 Hypertensive chronic kidney disease with stage 1 through stage 4 chronic kidney disease, or unspecified chronic kidney disease: Secondary | ICD-10-CM | POA: Diagnosis present

## 2020-07-22 DIAGNOSIS — K5900 Constipation, unspecified: Secondary | ICD-10-CM | POA: Diagnosis present

## 2020-07-22 DIAGNOSIS — I4821 Permanent atrial fibrillation: Secondary | ICD-10-CM | POA: Diagnosis present

## 2020-07-22 DIAGNOSIS — Z298 Encounter for other specified prophylactic measures: Secondary | ICD-10-CM | POA: Diagnosis not present

## 2020-07-22 DIAGNOSIS — G479 Sleep disorder, unspecified: Secondary | ICD-10-CM | POA: Diagnosis not present

## 2020-07-22 DIAGNOSIS — Z79890 Hormone replacement therapy (postmenopausal): Secondary | ICD-10-CM

## 2020-07-22 DIAGNOSIS — E876 Hypokalemia: Secondary | ICD-10-CM | POA: Diagnosis present

## 2020-07-22 DIAGNOSIS — Z20822 Contact with and (suspected) exposure to covid-19: Secondary | ICD-10-CM | POA: Diagnosis present

## 2020-07-22 DIAGNOSIS — I613 Nontraumatic intracerebral hemorrhage in brain stem: Secondary | ICD-10-CM | POA: Diagnosis not present

## 2020-07-22 DIAGNOSIS — I69191 Dysphagia following nontraumatic intracerebral hemorrhage: Secondary | ICD-10-CM | POA: Diagnosis present

## 2020-07-22 DIAGNOSIS — R531 Weakness: Secondary | ICD-10-CM | POA: Diagnosis present

## 2020-07-22 DIAGNOSIS — N1831 Chronic kidney disease, stage 3a: Secondary | ICD-10-CM | POA: Diagnosis present

## 2020-07-22 DIAGNOSIS — I69154 Hemiplegia and hemiparesis following nontraumatic intracerebral hemorrhage affecting left non-dominant side: Secondary | ICD-10-CM | POA: Diagnosis not present

## 2020-07-22 DIAGNOSIS — R4701 Aphasia: Secondary | ICD-10-CM | POA: Diagnosis present

## 2020-07-22 DIAGNOSIS — Y92239 Unspecified place in hospital as the place of occurrence of the external cause: Secondary | ICD-10-CM | POA: Diagnosis not present

## 2020-07-22 DIAGNOSIS — M810 Age-related osteoporosis without current pathological fracture: Secondary | ICD-10-CM | POA: Diagnosis present

## 2020-07-22 DIAGNOSIS — W19XXXA Unspecified fall, initial encounter: Secondary | ICD-10-CM | POA: Diagnosis not present

## 2020-07-22 DIAGNOSIS — Z79899 Other long term (current) drug therapy: Secondary | ICD-10-CM | POA: Diagnosis not present

## 2020-07-22 DIAGNOSIS — E871 Hypo-osmolality and hyponatremia: Secondary | ICD-10-CM | POA: Diagnosis not present

## 2020-07-22 DIAGNOSIS — I4891 Unspecified atrial fibrillation: Secondary | ICD-10-CM | POA: Diagnosis not present

## 2020-07-22 DIAGNOSIS — E875 Hyperkalemia: Secondary | ICD-10-CM | POA: Diagnosis not present

## 2020-07-22 DIAGNOSIS — D696 Thrombocytopenia, unspecified: Secondary | ICD-10-CM | POA: Diagnosis not present

## 2020-07-22 DIAGNOSIS — D72819 Decreased white blood cell count, unspecified: Secondary | ICD-10-CM | POA: Diagnosis not present

## 2020-07-22 LAB — CBC
HCT: 45 % (ref 36.0–46.0)
Hemoglobin: 14.2 g/dL (ref 12.0–15.0)
MCH: 29.2 pg (ref 26.0–34.0)
MCHC: 31.6 g/dL (ref 30.0–36.0)
MCV: 92.4 fL (ref 80.0–100.0)
Platelets: 162 10*3/uL (ref 150–400)
RBC: 4.87 MIL/uL (ref 3.87–5.11)
RDW: 18.6 % — ABNORMAL HIGH (ref 11.5–15.5)
WBC: 5.2 10*3/uL (ref 4.0–10.5)
nRBC: 0 % (ref 0.0–0.2)

## 2020-07-22 LAB — DIFFERENTIAL
Abs Immature Granulocytes: 0.04 10*3/uL (ref 0.00–0.07)
Basophils Absolute: 0 10*3/uL (ref 0.0–0.1)
Basophils Relative: 0 %
Eosinophils Absolute: 0 10*3/uL (ref 0.0–0.5)
Eosinophils Relative: 1 %
Immature Granulocytes: 1 %
Lymphocytes Relative: 16 %
Lymphs Abs: 0.8 10*3/uL (ref 0.7–4.0)
Monocytes Absolute: 0.5 10*3/uL (ref 0.1–1.0)
Monocytes Relative: 10 %
Neutro Abs: 3.8 10*3/uL (ref 1.7–7.7)
Neutrophils Relative %: 72 %

## 2020-07-22 LAB — ETHANOL: Alcohol, Ethyl (B): <10 mg/dL (ref <10)

## 2020-07-22 LAB — CBG MONITORING, ED: Glucose-Capillary: 120 mg/dL — ABNORMAL HIGH (ref 70–99)

## 2020-07-22 LAB — SARS CORONAVIRUS 2 BY RT PCR (HOSPITAL ORDER, PERFORMED IN ~~LOC~~ HOSPITAL LAB): SARS Coronavirus 2: NEGATIVE

## 2020-07-22 LAB — OSMOLALITY: Osmolality: 297 mOsm/kg — ABNORMAL HIGH (ref 275–295)

## 2020-07-22 LAB — APTT: aPTT: 34 seconds (ref 24–36)

## 2020-07-22 LAB — PROTIME-INR
INR: 1.6 — ABNORMAL HIGH (ref 0.8–1.2)
Prothrombin Time: 18.3 seconds — ABNORMAL HIGH (ref 11.4–15.2)

## 2020-07-22 MED ORDER — ACETAMINOPHEN 160 MG/5ML PO SOLN: 650.0000 mg | ORAL | Status: DC | PRN

## 2020-07-22 MED ORDER — DEXAMETHASONE SODIUM PHOSPHATE 10 MG/ML IJ SOLN
10.0000 mg | Freq: Once | INTRAMUSCULAR | Status: AC
  Administered 2020-07-22: 10 mg via INTRAVENOUS
  Filled 2020-07-22: qty 1

## 2020-07-22 MED ORDER — GADOBUTROL 1 MMOL/ML IV SOLN
7.0000 mL | Freq: Once | INTRAVENOUS | Status: AC | PRN
  Administered 2020-07-22: 7 mL via INTRAVENOUS

## 2020-07-22 MED ORDER — SODIUM CHLORIDE 0.9 % IV SOLN: INTRAVENOUS | Status: DC

## 2020-07-22 MED ORDER — SENNOSIDES-DOCUSATE SODIUM 8.6-50 MG PO TABS
1.0000 | ORAL_TABLET | Freq: Two times a day (BID) | ORAL | Status: DC
  Filled 2020-07-22 (×2): qty 1

## 2020-07-22 MED ORDER — ACETAMINOPHEN 650 MG RE SUPP: 650.0000 mg | RECTAL | Status: DC | PRN

## 2020-07-22 MED ORDER — LEVETIRACETAM IN NACL 1500 MG/100ML IV SOLN
1500.0000 mg | Freq: Once | INTRAVENOUS | Status: AC
  Administered 2020-07-22: 1500 mg via INTRAVENOUS
  Filled 2020-07-22: qty 100

## 2020-07-22 MED ORDER — LORAZEPAM 2 MG/ML IJ SOLN
0.5000 mg | Freq: Once | INTRAMUSCULAR | Status: AC
  Administered 2020-07-22: 0.5 mg via INTRAVENOUS
  Filled 2020-07-22: qty 1

## 2020-07-22 MED ORDER — LEVETIRACETAM IN NACL 500 MG/100ML IV SOLN
500.0000 mg | Freq: Two times a day (BID) | INTRAVENOUS | Status: DC
  Administered 2020-07-22 – 2020-07-23 (×3): 500 mg via INTRAVENOUS
  Filled 2020-07-22 (×5): qty 100

## 2020-07-22 MED ORDER — NICARDIPINE HCL IN NACL 20-0.86 MG/200ML-% IV SOLN
0.0000 mg/h | INTRAVENOUS | Status: DC
  Administered 2020-07-23: 5 mg/h via INTRAVENOUS
  Filled 2020-07-22: qty 200

## 2020-07-22 MED ORDER — PANTOPRAZOLE SODIUM 40 MG IV SOLR
40.0000 mg | Freq: Every day | INTRAVENOUS | Status: DC
  Administered 2020-07-22 – 2020-07-24 (×3): 40 mg via INTRAVENOUS
  Filled 2020-07-22 (×3): qty 40

## 2020-07-22 MED ORDER — SODIUM CHLORIDE 0.9 % IV BOLUS
500.0000 mL | Freq: Once | INTRAVENOUS | Status: AC
  Administered 2020-07-22: 500 mL via INTRAVENOUS

## 2020-07-22 MED ORDER — LORAZEPAM 2 MG/ML IJ SOLN
INTRAMUSCULAR | Status: AC
  Administered 2020-07-22: 2 mg via INTRAVENOUS
  Filled 2020-07-22: qty 1

## 2020-07-22 MED ORDER — ACETAMINOPHEN 325 MG PO TABS: 650.0000 mg | ORAL_TABLET | ORAL | Status: DC | PRN

## 2020-07-22 MED ORDER — LORAZEPAM 2 MG/ML IJ SOLN: 2.0000 mg | Freq: Once | INTRAMUSCULAR | Status: AC

## 2020-07-22 MED ORDER — PROTHROMBIN COMPLEX CONC HUMAN 500 UNITS IV KIT
1624.0000 [IU] | PACK | Status: DC
  Filled 2020-07-22: qty 1624

## 2020-07-22 MED ORDER — PROTHROMBIN COMPLEX CONC HUMAN 500 UNITS IV KIT
544.0000 [IU] | PACK | Status: AC
  Administered 2020-07-22: 544 [IU] via INTRAVENOUS
  Filled 2020-07-22: qty 544

## 2020-07-22 MED ORDER — LABETALOL HCL 5 MG/ML IV SOLN
10.0000 mg | Freq: Once | INTRAVENOUS | Status: AC
  Administered 2020-07-22: 10 mg via INTRAVENOUS

## 2020-07-22 MED ORDER — STROKE: EARLY STAGES OF RECOVERY BOOK
Freq: Once | Status: DC
  Filled 2020-07-22: qty 1

## 2020-07-22 MED ORDER — PROTHROMBIN COMPLEX CONC HUMAN 500 UNITS IV KIT
1591.0000 [IU] | PACK | Status: AC
  Administered 2020-07-22: 1591 [IU] via INTRAVENOUS
  Filled 2020-07-22: qty 1591

## 2020-07-22 MED ORDER — SODIUM CHLORIDE 0.9 % IV SOLN
100.0000 mL/h | INTRAVENOUS | Status: DC
  Administered 2020-07-22: 100 mL/h via INTRAVENOUS

## 2020-07-22 MED ORDER — VITAMIN K1 10 MG/ML IJ SOLN
10.0000 mg | INTRAVENOUS | Status: AC
  Administered 2020-07-22: 10 mg via INTRAVENOUS
  Filled 2020-07-22: qty 1

## 2020-07-22 NOTE — Consult Note (Signed)
NEURO HOSPITALIST CONSULT NOTE   Requesting Physician: Dr. Tomi Bamberger    Chief Complaint: Difficulty with speech   History obtained from:  Patient, Chart and Daughter  HPI:                                                                                                                                         Lori Bridges is an 86 y.o. female with history of atrial fibrillation on coumadin, hypertension, hyperlipidemia, hypothyroidism and chronic kidney disease. Baseline she lives at home and her grandson lives with her. She has 7 children that live in the area and check on her regularly. She is able to ambulate with a walker. She is able to dress and bathe herself and prepare meals. Her daughter reports that she has mild issues with memory and within the past couple of months her daughter has started to manage her finances.   She was last seen normal by another daughter around 8:50am today. She then took a bath and was able to dress herself. When her daughter present arrived around 1PM she noticed that the patient was confused and having difficulty understanding speech. She did not notice any weakness or seizure like activity at that time.   CT Head IMPRESSION: Acute intraparenchymal hemorrhage at LEFT vertex 2.2 x 1.7 x 2.4 cm in size with surrounding vasogenic edema.Extra-axial extension of hemorrhage along the falx and interhemispheric fissure. Additional intraparenchymal hemorrhage at LEFT temporal lobe 11 x 6 x 10 mm. Findings are concerning for hemorrhagic metastases and further assessment by MR imaging with and without contrast recommended.  Following image results Renaldo Reel was ordered by the ED Provider. Last dose of coumadin was the night prior around 6:30PM.   Neurology was consulted and upon arrival to the room the patient was having twitching of her right leg and arm, and was felt to be having seizure activity secondary to the intraparenchymal hemorrhage. Keppra and  ativan were ordered for seizure activity. Decadron was also ordered to help with edema due to concern the hemorrhage was secondary to metastasis.  She remained alert with the seizure activity and was able to answer some simple questions, but appeared to be having some difficulty following conversation and appropriately following all commands. She was asked to lift her leg and she lifted her arm and said that it was her leg. She was able to lift all extremities, however the right side was weaker. She also had difficulty fully tracking to the right side of her visual field.   Date last known well: Date: 07/22/2020 Time last known well: Time: 08:50 tPA Given: No: Brain hemorrhage Modified Rankin: Rankin Score=3 Intracerebral Hemorrhage (ICH) Score: 2   Past Medical History:  Diagnosis Date   Adenomatous colon polyp 2005   Anxiety age 81   Carotid artery bruit 2001   left   CKD (chronic kidney disease), stage III  DDD (degenerative disc disease)    DJD (degenerative joint disease)    Gastroenteritis 2011   GERD (gastroesophageal reflux disease) 2003   History of pneumonia    HTN (hypertension) 1983   Hyperlipidemia 2000   Hypothyroidism 2009   Iron deficiency anemia 1983   Osteoarthritis 2001   Osteoporosis 2001   Permanent atrial fibrillation ([place]) 07/23/2010        Persistent headaches 1987   Pulmonary HTN ([place]) 04/03/2017   Moderate with PASP 40mmHg by echo 02/2017   Tricuspid regurgitation    severe by echo 02/25/12   Vertigo    Vitamin D deficiency 2011    Past Surgical History:  Procedure Laterality Date   COLONOSCOPY W/ BIOPSIES AND POLYPECTOMY  09/16/2004   adenomatous polyp, diverticulosis, internal hemorrhoids   s/p BLT     TUBAL LIGATION      Family History  Problem Relation Age of Onset   Hypertension Father    Peripheral vascular disease Father    Diabetes Mother    Heart disease Brother 36   Liver cancer Sister           Social History:  reports that she has never smoked. She has never used smokeless tobacco. She reports that she does not drink alcohol and does not use drugs.  Allergies:  Allergies  Allergen Reactions   Doxycycline Other (See Comments)    UNKNOWN unknown   Ezetimibe Other (See Comments)    Arthralgia, Myalgias   Meclizine Other (See Comments) and Palpitations    Tachycardia   Pravastatin Other (See Comments)    Arthralgia, Myalgias   Colestipol Other (See Comments)   Sulfamethoxazole Other (See Comments)    unknown   Ace Inhibitors Cough   Codeine Nausea Only   Colestipol Hcl Other (See Comments)    Malaise   Lovastatin Other (See Comments)    UNKNOWN Unknown   Sulfa Antibiotics Other (See Comments)    unknown    Medications:                                                                                                                           I have reviewed the patient's current medications.  ROS:  History obtained from the patient  General ROS: negative for - chills, fatigue, fever, night sweats, weight gain or weight loss Psychological ROS: slight memory issues per daughter Ophthalmic ROS: negative for - blurry vision, double vision, eye pain or loss of vision ENT ROS: negative for - epistaxis, nasal discharge, oral lesions, sore throat, tinnitus or vertigo Respiratory ROS: negative for - cough,  shortness of breath or wheezing Cardiovascular ROS: negative for - chest pain, dyspnea on exertion. Daughter reports that she does get very anxious and experiences rapid heart rates  Gastrointestinal ROS: negative for - abdominal pain, diarrhea,  nausea/vomiting or stool incontinence Genito-Urinary ROS: negative for - dysuria, hematuria, Musculoskeletal ROS: negative for - joint swelling or muscular weakness Neurological ROS: as  noted in HPI   General Examination:                                                                                                      Blood pressure (!) 145/96, pulse (!) 117, temperature 98.7 F (37.1 C), temperature source Oral, resp. rate 22, weight 69.4 kg, SpO2 92 %.  Physical Exam  Constitutional: Appears well-developed and well-nourished.  Psych: Affect appropriate to situation Eyes: Normal external eye and conjunctiva. HENT: Normocephalic, no lesions, without obvious abnormality.   Musculoskeletal-no joint tenderness, deformity or swelling Cardiovascular: known history of atrial fibrillation, on bedside telemetry   Respiratory: Effort normal, non-labored breathing saturations WNL GI: Soft.  No distension. There is no tenderness.  Skin: WDI  Neurological Examination Mental Status: Alert. Speech is clear and able to speak in sentences. Able to answer simple questions and follow simple commands. Difficulty with complex commands. Active seizure activity and twitching of the right arm and leg during examination.  Cranial Nerves: II:  Visual fields grossly normal,  III,IV, VI: ptosis not present, extra-ocular motions decreased tracking to the right side.  V,VII: smile symmetric, facial light touch sensation normal bilaterally VIII: hearing normal bilaterally IX,X: uvula rises symmetrically XI: bilateral shoulder shrug XII: midline tongue extension Motor: Right side: able to lift to gravity with drift. Left side: able to hold up without drift.  Sensory: unable to feel sensation to right upper extremity and right lower extremity. Actively having twitching in the right and lower extremity at time of exam.   Lab Results: Basic Metabolic Panel: No results for input(s): NA, K, CL, CO2, GLUCOSE, BUN, CREATININE, CALCIUM, MG, PHOS in the last 168 hours.  CBC: Recent Labs  Lab 07/22/20 1535  WBC 5.2  NEUTROABS 3.8  HGB 14.2  HCT 45.0  MCV 92.4  PLT 162    Lipid Panel: No  results for input(s): CHOL, TRIG, HDL, CHOLHDL, VLDL, LDLCALC in the last 168 hours.  CBG: Recent Labs  Lab 07/22/20 1449  GLUCAP 120*    Imaging: CT HEAD WO CONTRAST  Result Date: 07/22/2020 CLINICAL DATA:  Found at 1340 hours by daughter leaning against wall between the bed and a dresser, aphasic, question stroke; history pulmonary hypertension, atrial fibrillation, hypertension, stage III chronic kidney disease, CHF EXAM: CT HEAD WITHOUT CONTRAST TECHNIQUE: Contiguous axial images were  obtained from the base of the skull through the vertex without intravenous contrast. Sagittal and coronal MPR images reconstructed from axial data set. COMPARISON:  None FINDINGS: Brain: Generalized atrophy. Normal ventricular morphology. High attenuation acute intraparenchymal hemorrhage identified at LEFT vertex 2.2 x 1.7 x 2.4 cm in size with surrounding vasogenic edema. Extra-axial extension of hemorrhage along the falx and medial sulci to LEFT vertex. No midline shift. Additional intraparenchymal hemorrhage at LEFT temporal lobe 11 x 6 x 10 mm. These could be due to hemorrhagic metastases, less likely hemorrhagic infarct. No additional infarcts, mass, or hemorrhage identified. Vascular: No hyperdense vessels Skull: Intact Sinuses/Orbits: Clear Other: N/A IMPRESSION: Acute intraparenchymal hemorrhage at LEFT vertex 2.2 x 1.7 x 2.4 cm in size with surrounding vasogenic edema. Extra-axial extension of hemorrhage along the falx and interhemispheric fissure. Additional intraparenchymal hemorrhage at LEFT temporal lobe 11 x 6 x 10 mm. Findings are concerning for hemorrhagic metastases and further assessment by MR imaging with and without contrast recommended. No midline shift or hydrocephalus. Critical Value/emergent results were called by telephone at the time of interpretation on 07/22/2020 at 5:28 pm to provider [facility] , who verbally acknowledged these results. Electronically Signed   By: Lavonia Dana M.D.   On:  07/22/2020 17:30    Assessment: 86 y.o. female with history of atrial fibrillation on coumadin, hypertension, hyperlipidemia, hypothyroidism and chronic kidney disease.  Presented after family had difficulty understanding speech and head CT identified two  intraparenchymal hemorrhages concerning for metastases. Order for Eppie Gibson reversal was placed and neurology was consulted. At the time of neurology arrival she was actively having seizure activity with her right arm and leg twitching, orders were placed for Keppra, Ativan and Decardon. She remained alert and was speaking clearly, although she appeared to have difficulty fulling understanding and was unable to follow complex commands. She had a drift to her right side and she was unable to feel sensation on the right side when asked. She also had difficulty tracking in her right visual field.   Impression:   Intracerebral hemorrhage: etiology unclear, possibly secondary to metastases    Seizure  Recommendations:   Intracebreal Hemorrhage:   Admission planned to neuro ICU  BP goal : SBP<140. PRN Labetalol given. Order for PRN Nicardipine to use if needed.   Reversed with [facility]. Pharmacy to follow-up with repeat INR  Repeat Head CT Head planned for 6 hours to evaluate hemorrhage   Brain MRI: Pending to help determine etiology  Decadron 10mg  given to help with edema, given concern secondary to metastases   PT consult, OT consult, Speech consult  Telemetry monitoring  Frequent neuro checks  NPO until passes stroke swallow screen   Seizure Activity  Keprra 1,500mg  ordered  Ativan 2mg  ordered   Dr. Lorrin Goodell discussed the plan with the patients daughter [name]  Review and additional recommendations to follow from attending neurologist.  Gwenyth Bouillon FNP-C [facility] 07/22/2020, 7:03 PM

## 2020-07-22 NOTE — ED Provider Notes (Signed)
[name] EMERGENCY DEPARTMENT Provider Note   CSN: [id number] Arrival date & time: 07/22/20  1439     History Chief Complaint  Patient presents with  . Weakness    Lori Bridges is a 86 y.o. female.  HPI   Pt was seen this am around 9 am and seemed to be in her usual state but daughter states earlier she had been having trouble remembering her pills.  Family found her around 140pm leaning against a wall.  SHe was having trouble forming words.  She did not seem to have any focal weakness.  Patient's symptoms have persisted.  She does not have any history of prior stroke difficulty.  Patient does take Coumadin  Past Medical History:  Diagnosis Date  . Adenomatous colon polyp 2005  . Anxiety age 59  . Carotid artery bruit 2001   left  . CKD (chronic kidney disease), stage III   . DDD (degenerative disc disease)   . DJD (degenerative joint disease)   . Gastroenteritis 2011  . GERD (gastroesophageal reflux disease) 2003  . History of pneumonia   . HTN (hypertension) 1983  . Hyperlipidemia 2000  . Hypothyroidism 2009  . Iron deficiency anemia 1983  . Osteoarthritis 2001  . Osteoporosis 2001  . Permanent atrial fibrillation ([place]) 07/23/2010       . Persistent headaches 1987  . Pulmonary HTN ([place]) 04/03/2017   Moderate with PASP 62mmHg by echo 02/2017  . Tricuspid regurgitation    severe by echo 02/25/12  . Vertigo   . Vitamin D deficiency 2011    Patient Active Problem List   Diagnosis Date Noted  . Chronic diastolic CHF (congestive heart failure) ([place]) 04/03/2017  . Pulmonary HTN ([place]) 04/03/2017  . Chest pain 04/03/2017  . Dizziness 06/30/2016  . Encounter for therapeutic drug monitoring 01/24/2014  . Hyperlipidemia 08/24/2013  . Long term (current) use of anticoagulants 05/17/2013  . Tricuspid regurgitation 03/04/2012  . Nausea 02/16/2012  . Personal history of colonic polyps 10/09/2011  . HYPERTENSION, MILD 09/11/2010  . Permanent atrial  fibrillation ([place]) 07/23/2010    Past Surgical History:  Procedure Laterality Date  . COLONOSCOPY W/ BIOPSIES AND POLYPECTOMY  09/16/2004   adenomatous polyp, diverticulosis, internal hemorrhoids  . s/p BLT    . TUBAL LIGATION       OB History   No obstetric history on file.     Family History  Problem Relation Age of Onset  . Hypertension Father   . Peripheral vascular disease Father   . Diabetes Mother   . Heart disease Brother 54  . Liver cancer Sister     Social History   Tobacco Use  . Smoking status: Never Smoker  . Smokeless tobacco: Never Used  Substance Use Topics  . Alcohol use: No  . Drug use: No    Home Medications Prior to Admission medications   Medication Sig Start Date End Date Taking? Authorizing Provider  Cholecalciferol (VITAMIN D3) 2000 units TABS Take 2,000 Units by mouth daily.    [provider]  Cyanocobalamin (VITAMIN B-12 PO) Take 1 tablet by mouth daily.    [provider]  furosemide (LASIX) 20 MG tablet Take 1 tablet (20 mg total) by mouth daily. 01/17/20 07/04/20  Sueanne Margarita, MD  hydrALAZINE (APRESOLINE) 25 MG tablet Take 1 tablet (25 mg total) by mouth in the morning and at bedtime. 06/20/20 07/20/20  Dunn, Nedra Hai, PA-C  irbesartan (AVAPRO) 150 MG tablet Take 1 tablet (  150 mg total) by mouth daily. 07/19/19   Imogene Burn, PA-C  LORazepam (ATIVAN) 1 MG tablet Take 0.5 mg by mouth at bedtime.     [provider]  metoprolol succinate (TOPROL XL) 100 MG 24 hr tablet TAKE 1 TABLET(100 MG) BY MOUTH DAILY IN THE MORNING. 07/13/20   Sueanne Margarita, MD  metoprolol succinate (TOPROL XL) 25 MG 24 hr tablet Take 2 tablets (50 mg total) by mouth at bedtime. 07/13/20   Sueanne Margarita, MD  pantoprazole (PROTONIX) 40 MG tablet Take 40 mg by mouth daily.     [provider]  SYNTHROID 50 MCG tablet Take 1 tablet by mouth daily OES 03/01/16   [provider]  warfarin (COUMADIN) 2 MG tablet Take 1 tablet (2  mg total) by mouth daily. Or as directed by the coumadin clinic 05/09/20   Sueanne Margarita, MD    Allergies    Doxycycline, Ezetimibe, Meclizine, Pravastatin, Colestipol, Sulfamethoxazole, Ace inhibitors, Codeine, Colestipol hcl, Lovastatin, and Sulfa antibiotics  Review of Systems   Review of Systems  All other systems reviewed and are negative.   Physical Exam Updated Vital Signs BP (!) 145/96   Pulse (!) 117   Temp 98.7 F (37.1 C) (Oral)   Resp 22   Wt 69.4 kg   SpO2 92%   BMI 24.69 kg/m   Physical Exam Vitals and nursing note reviewed.  Constitutional:      General: She is not in acute distress.    Appearance: She is well-developed.  HENT:     Head: Normocephalic and atraumatic.     Right Ear: External ear normal.     Left Ear: External ear normal.  Eyes:     General: No scleral icterus.       Right eye: No discharge.        Left eye: No discharge.     Conjunctiva/sclera: Conjunctivae normal.  Neck:     Trachea: No tracheal deviation.  Cardiovascular:     Rate and Rhythm: Normal rate and regular rhythm.  Pulmonary:     Effort: Pulmonary effort is normal. No respiratory distress.     Breath sounds: Normal breath sounds. No stridor. No wheezing or rales.  Abdominal:     General: Bowel sounds are normal. There is no distension.     Palpations: Abdomen is soft.     Tenderness: There is no abdominal tenderness. There is no guarding or rebound.  Musculoskeletal:        General: No tenderness.     Cervical back: Neck supple.  Skin:    General: Skin is warm and dry.     Findings: No rash.  Neurological:     Mental Status: She is alert.     Cranial Nerves: Cranial nerve deficit (No facial droop, extraocular movements intact, tongue to the right) and dysarthria present.     Sensory: No sensory deficit.     Motor: No abnormal muscle tone or seizure activity.     Coordination: Coordination normal.     Comments: No pronator drift bilateral upper extrem, able to hold  both legs off bed for 5 seconds, sensation intact in all extremities, no visual field cuts, no left or right sided neglect, normal finger-nose exam bilaterally, no nystagmus noted  Aphasic, unable to answer questions, form words appropriate,      ED Results / Procedures / Treatments   Labs (all labs ordered are listed, but only abnormal results are displayed) Labs Reviewed  CBC - Abnormal; Notable for the following components:      Result Value   RDW 18.6 (*)    All other components within normal limits  CBG MONITORING, ED - Abnormal; Notable for the following components:   Glucose-Capillary 120 (*)    All other components within normal limits  SARS CORONAVIRUS 2 BY RT PCR (HOSPITAL ORDER, [place] [facility])  ETHANOL  DIFFERENTIAL  RAPID URINE DRUG SCREEN, HOSP PERFORMED  URINALYSIS, ROUTINE W REFLEX MICROSCOPIC  PROTIME-INR  APTT    EKG EKG Interpretation  Date/Time:  Sunday July 22 2020 16:10:57 EDT Ventricular Rate:  103 PR Interval:    QRS Duration: 76 QT Interval:  395 QTC Calculation: 518 R Axis:   0 Text Interpretation: poor data quality, probable sinus with pvc Anteroseptal infarct, age indeterminate Prolonged QT interval Confirmed by Dorie Rank [phone]) on 07/22/2020 4:13:52 PM   Radiology CT HEAD WO CONTRAST  Result Date: 07/22/2020 CLINICAL DATA:  Found at 1340 hours by daughter leaning against wall between the bed and a dresser, aphasic, question stroke; history pulmonary hypertension, atrial fibrillation, hypertension, stage III chronic kidney disease, CHF EXAM: CT HEAD WITHOUT CONTRAST TECHNIQUE: Contiguous axial images were obtained from the base of the skull through the vertex without intravenous contrast. Sagittal and coronal MPR images reconstructed from axial data set. COMPARISON:  None FINDINGS: Brain: Generalized atrophy. Normal ventricular morphology. High attenuation acute intraparenchymal hemorrhage identified at LEFT vertex 2.2 x  1.7 x 2.4 cm in size with surrounding vasogenic edema. Extra-axial extension of hemorrhage along the falx and medial sulci to LEFT vertex. No midline shift. Additional intraparenchymal hemorrhage at LEFT temporal lobe 11 x 6 x 10 mm. These could be due to hemorrhagic metastases, less likely hemorrhagic infarct. No additional infarcts, mass, or hemorrhage identified. Vascular: No hyperdense vessels Skull: Intact Sinuses/Orbits: Clear Other: N/A IMPRESSION: Acute intraparenchymal hemorrhage at LEFT vertex 2.2 x 1.7 x 2.4 cm in size with surrounding vasogenic edema. Extra-axial extension of hemorrhage along the falx and interhemispheric fissure. Additional intraparenchymal hemorrhage at LEFT temporal lobe 11 x 6 x 10 mm. Findings are concerning for hemorrhagic metastases and further assessment by MR imaging with and without contrast recommended. No midline shift or hydrocephalus. Critical Value/emergent results were called by telephone at the time of interpretation on 07/22/2020 at 5:28 pm to provider [facility] , who verbally acknowledged these results. Electronically Signed   By: Lavonia Dana M.D.   On: 07/22/2020 17:30    Procedures .Critical Care Performed by: Dorie Rank, MD Authorized by: Dorie Rank, MD   Critical care provider statement:    Critical care time (minutes):  45   Critical care was time spent personally by me on the following activities:  Discussions with consultants, evaluation of patient's response to treatment, examination of patient, ordering and performing treatments and interventions, ordering and review of laboratory studies, ordering and review of radiographic studies, pulse oximetry, re-evaluation of patient's condition, obtaining history from patient or surrogate and review of old charts   (including critical care time)  Medications Ordered in ED Medications  sodium chloride 0.9 % bolus 500 mL (500 mLs Intravenous New Bag/Given 07/22/20 1809)    Followed by  0.9 %  sodium  chloride infusion (100 mL/hr Intravenous New Bag/Given 07/22/20 1810)  prothrombin complex conc human (KCENTRA) IVPB 1,624 Units (has no administration in time range)  phytonadione (VITAMIN K) 10 mg in dextrose 5 % 50 mL IVPB (has no administration in time range)  ED Course  I have reviewed the triage vital signs and the nursing notes.  Pertinent labs & imaging results that were available during my care of the patient were reviewed by me and considered in my medical decision making (see chart for details).  Clinical Course as of Jul 22 1816  Sun Jul 22, 2020  1758 Medical records indicate the patient is on Coumadin.  CT scan does show cerebral hemorrhage.  Discussed with neurology.  Recommends reversal.  I have ordered Kcentra.   [QR]  9758 Mental status remains unchanged.     [JK]    Clinical Course User Index [JK] Dorie Rank, MD   MDM Rules/Calculators/A&P                          Patient CT scan is worrisome for metastatic brain lesions with associated hemorrhage.  Patient does have surrounding edema.  Patient however is also on Coumadin.  Her INR is pending but with her cerebral hemorrhage we will proceed with Kcentra reversal.  Patient CBC is unremarkable.  Metabolic panel and coags are pending.  I have consulted with neurology.  Patient will require admission and further treatment.  Endings and plan were discussed with patient and her daughter Final Clinical Impression(s) / ED Diagnoses Final diagnoses:  Cerebral brain hemorrhage [facility])       Dorie Rank, MD 07/22/20 [phone]

## 2020-07-22 NOTE — Progress Notes (Signed)
No ongoing seizure on EEG, continue to monitor.  Roland Rack, MD [facility] [phone]  If 7pm- 7am, please page neurology on call as listed in [place].

## 2020-07-22 NOTE — ED Notes (Signed)
K centra started

## 2020-07-22 NOTE — Progress Notes (Signed)
Stat EEG completed; initiated LTM, using same leads.  Electrodes secured with tape and net. Pt event button tested, daughter and RN educated regarding use. No skin break down noted.  Dr Hortense Ramal and Dr. Leonel Ramsay

## 2020-07-22 NOTE — Procedures (Addendum)
Patient Name: Lori Bridges  MRN: [id number]  Epilepsy Attending: Lora Havens  Referring Physician/Provider: Dr Donnetta Simpers Date: 07/22/2020 Duration: 24.02 mins  Patient history: 86 y/o F on Warfarin p/w L frontomedial ICH and another smaller L temporooccipital ICH with associated vasogenic edema. On exam noted to have R leg and arm twitching, Unable to abduct R eye and adduct Left eye. EEG to evaluate for seizure  Level of alertness: lethargic  AEDs during EEG study: Keppra, ativan  Technical aspects: This EEG study was done with scalp electrodes positioned according to the 10-20 International system of electrode placement. Electrical activity was acquired at a sampling rate of 500Hz  and reviewed with a high frequency filter of 70Hz  and a low frequency filter of 1Hz . EEG data were recorded continuously and digitally stored.   Description: No posterior dominant rhythm was seen. EEG showed continuous generalized 3 to 6 Hz theta-delta slowing admixed with15 to 18 Hz beta activity with irregular morphology distributed symmetrically and diffusely.  Hyperventilation and photic stimulation were not performed.     ABNORMALITY -Excessive beta, generalized -Continuous slow, generalized  IMPRESSION: This study is suggestive of moderate diffuse encephalopathy, nonspecific etiology but likely related to sedation, post-ictal state. The excessive beta activity seen in the background is most likely due to the effect of benzodiazepine and is a benign EEG pattern. No seizures or definite epileptiform discharges were seen throughout the recording.   Lori Bridges

## 2020-07-22 NOTE — ED Triage Notes (Signed)
Pt was found around 1340 by daughter leaning up against wall between bed and dresser. Pt found to be aphasic at time of transfer. LKW was 0900 today.

## 2020-07-23 ENCOUNTER — Inpatient Hospital Stay (HOSPITAL_COMMUNITY): Payer: Medicare Other

## 2020-07-23 ENCOUNTER — Encounter (HOSPITAL_COMMUNITY): Payer: Self-pay | Admitting: Neurology

## 2020-07-23 DIAGNOSIS — I482 Chronic atrial fibrillation, unspecified: Secondary | ICD-10-CM

## 2020-07-23 DIAGNOSIS — I10 Essential (primary) hypertension: Secondary | ICD-10-CM

## 2020-07-23 DIAGNOSIS — Z7901 Long term (current) use of anticoagulants: Secondary | ICD-10-CM

## 2020-07-23 DIAGNOSIS — E7800 Pure hypercholesterolemia, unspecified: Secondary | ICD-10-CM

## 2020-07-23 DIAGNOSIS — I619 Nontraumatic intracerebral hemorrhage, unspecified: Secondary | ICD-10-CM

## 2020-07-23 LAB — BASIC METABOLIC PANEL
Anion gap: 14 (ref 5–15)
BUN: 18 mg/dL (ref 8–23)
CO2: 19 mmol/L — ABNORMAL LOW (ref 22–32)
Calcium: 10.3 mg/dL (ref 8.9–10.3)
Chloride: 108 mmol/L (ref 98–111)
Creatinine, Ser: 1.22 mg/dL — ABNORMAL HIGH (ref 0.44–1.00)
GFR calc Af Amer: 46 mL/min — ABNORMAL LOW (ref >60)
GFR calc non Af Amer: 40 mL/min — ABNORMAL LOW (ref >60)
Glucose, Bld: 126 mg/dL — ABNORMAL HIGH (ref 70–99)
Potassium: 3.2 mmol/L — ABNORMAL LOW (ref 3.5–5.1)
Sodium: 141 mmol/L (ref 135–145)

## 2020-07-23 LAB — PROTIME-INR
INR: 1.5 — ABNORMAL HIGH (ref 0.8–1.2)
INR: 1.4 — ABNORMAL HIGH (ref 0.8–1.2)
INR: 1.3 — ABNORMAL HIGH (ref 0.8–1.2)
INR: 1.4 — ABNORMAL HIGH (ref 0.8–1.2)
Prothrombin Time: 17.5 seconds — ABNORMAL HIGH (ref 11.4–15.2)
Prothrombin Time: 16.4 seconds — ABNORMAL HIGH (ref 11.4–15.2)
Prothrombin Time: 16.1 seconds — ABNORMAL HIGH (ref 11.4–15.2)
Prothrombin Time: 16.7 seconds — ABNORMAL HIGH (ref 11.4–15.2)

## 2020-07-23 LAB — CBC
HCT: 43.7 % (ref 36.0–46.0)
Hemoglobin: 13.5 g/dL (ref 12.0–15.0)
MCH: 29.3 pg (ref 26.0–34.0)
MCHC: 30.9 g/dL (ref 30.0–36.0)
MCV: 94.8 fL (ref 80.0–100.0)
Platelets: UNDETERMINED 10*3/uL (ref 150–400)
RBC: 4.61 MIL/uL (ref 3.87–5.11)
RDW: 18 % — ABNORMAL HIGH (ref 11.5–15.5)
WBC: 4.1 10*3/uL (ref 4.0–10.5)
nRBC: 0 % (ref 0.0–0.2)

## 2020-07-23 LAB — MRSA PCR SCREENING: MRSA by PCR: NEGATIVE

## 2020-07-23 LAB — LIPID PANEL
Cholesterol: 205 mg/dL — ABNORMAL HIGH (ref 0–200)
HDL: 55 mg/dL (ref >40)
LDL Cholesterol: 140 mg/dL — ABNORMAL HIGH (ref 0–99)
Total CHOL/HDL Ratio: 3.7 RATIO
Triglycerides: 52 mg/dL (ref <150)
VLDL: 10 mg/dL (ref 0–40)

## 2020-07-23 LAB — HEMOGLOBIN A1C
Hgb A1c MFr Bld: 5.7 % — ABNORMAL HIGH (ref 4.8–5.6)
Mean Plasma Glucose: 116.89 mg/dL

## 2020-07-23 LAB — NO BLOOD PRODUCTS

## 2020-07-23 LAB — ABO/RH: ABO/RH(D): O POS

## 2020-07-23 MED ORDER — ORAL CARE MOUTH RINSE
15.0000 mL | Freq: Two times a day (BID) | OROMUCOSAL | Status: DC
  Administered 2020-07-23 – 2020-07-26 (×6): 15 mL via OROMUCOSAL

## 2020-07-23 MED ORDER — CHLORHEXIDINE GLUCONATE CLOTH 2 % EX PADS
6.0000 | MEDICATED_PAD | Freq: Every day | CUTANEOUS | Status: DC
  Administered 2020-07-24 – 2020-07-26 (×2): 6 via TOPICAL

## 2020-07-23 MED ORDER — SODIUM CHLORIDE 0.9% IV SOLUTION: Freq: Once | INTRAVENOUS | Status: AC

## 2020-07-23 MED ORDER — IOHEXOL 350 MG/ML SOLN
50.0000 mL | Freq: Once | INTRAVENOUS | Status: AC | PRN
  Administered 2020-07-23: 50 mL via INTRAVENOUS

## 2020-07-23 MED ORDER — VITAMIN K1 10 MG/ML IJ SOLN
10.0000 mg | Freq: Once | INTRAVENOUS | Status: AC
  Administered 2020-07-23: 10 mg via INTRAVENOUS
  Filled 2020-07-23: qty 1

## 2020-07-23 NOTE — Procedures (Addendum)
Patient Name: RMONI KEPLINGER  MRN: [id number]  Epilepsy Attending: Lora Havens  Referring Physician/Provider: Dr Donnetta Simpers Duration: 07/22/2020 2303 to 07/23/2020 1054  Patient history: 86 y/o F on Warfarin p/w L frontomedial ICH and another smaller L temporooccipital ICH with associated vasogenic edema.On exam noted to have R leg and arm twitching, Unable to abduct R eye and adduct Left eye. EEG to evaluate for seizure  Level of alertness: lethargic, asleep  AEDs during EEG study: Keppra, ativan  Technical aspects: This EEG study was done with scalp electrodes positioned according to the 10-20 International system of electrode placement. Electrical activity was acquired at a sampling rate of 500Hz  and reviewed with a high frequency filter of 70Hz  and a low frequency filter of 1Hz . EEG data were recorded continuously and digitally stored.   Description: No posterior dominant rhythm was seen.   Sleep was characterized by vertex waves, sleep spindles (12 to 14 Hz), maximal frontocentral region.  EEG showed continuous generalized 3 to 6 Hz theta-delta slowing admixed with15 to 18 Hz beta activity with irregular morphology distributed symmetrically and diffusely.  Hyperventilation and photic stimulation were not performed.   ABNORMALITY -Excessive beta, generalized -Continuous slow, generalized  IMPRESSION: This study is suggestive of moderate diffuse encephalopathy, nonspecific etiology but likely related to sedation, post-ictal state. The excessive beta activity seen in the background is most likely due to the effect of benzodiazepine and is a benign EEG pattern. No seizures or definite epileptiform discharges were seen throughout the recording.   Batsheva Stevick [name]

## 2020-07-23 NOTE — Progress Notes (Addendum)
[name] brought in copy of pt's advanced directives. The copy was placed in pt's shadow chart. Lori Bridges is pt's health care agent. Pt has included a special provision and limitation of NO BLOOD PRODUCTS in chart. Blood bank notified.  1853: Blood product refusal form filled out by daughter (POA) and faxed to blood bank. Blood bank ID bracelet removed.

## 2020-07-23 NOTE — Progress Notes (Signed)
LTM EEG discontinued - no skin breakdown at unhook.   

## 2020-07-23 NOTE — Progress Notes (Signed)
STROKE TEAM PROGRESS NOTE   INTERVAL HISTORY Pt daughter at bedside. Pt lying in bed, lethargic but eyes open, orientated to her age and month but not year. Able to repeat 3-word sentences and able to name but paucity of speech with perseveration. Still has mild right hemiparesis. Not very cooperative on exam. MRI with and without contrast did not reveal etiology of multifocal bleeding. Will check CTA head. INR today 1.4  Vitals:   07/23/20 0621 07/23/20 0625 07/23/20 0630 07/23/20 0640  BP: (!) 132/92 (!) 137/83 (!) 152/100 (!) 156/87  Pulse: 87 100 103 64  Resp: 16 15 20 19   Temp: (!) 97.4 F (36.3 C)     TempSrc: Oral     SpO2: 93% 93% 94% 95%  Weight:       CBC:  Recent Labs  Lab 07/22/20 1535 07/23/20 0200  WBC 5.2 4.1  NEUTROABS 3.8  --   HGB 14.2 13.5  HCT 45.0 43.7  MCV 92.4 94.8  PLT 162 PLATELET CLUMPS NOTED ON SMEAR, UNABLE TO ESTIMATE   Basic Metabolic Panel:  Recent Labs  Lab 07/23/20 0200  NA 141  K 3.2*  CL 108  CO2 19*  GLUCOSE 126*  BUN 18  CREATININE 1.22*  CALCIUM 10.3   Lipid Panel:  Recent Labs  Lab 07/23/20 0200  CHOL 205*  TRIG 52  HDL 55  CHOLHDL 3.7  VLDL 10  LDLCALC 140*   HgbA1c:  Recent Labs  Lab 07/23/20 0243  HGBA1C 5.7*   Urine Drug Screen: No results for input(s): LABOPIA, COCAINSCRNUR, LABBENZ, AMPHETMU, THCU, LABBARB in the last 168 hours.  Alcohol Level  Recent Labs  Lab 07/22/20 1535  ETH <10    IMAGING past 24 hours CT HEAD WO CONTRAST  Result Date: 07/22/2020 CLINICAL DATA:  Found at 1340 hours by daughter leaning against wall between the bed and a dresser, aphasic, question stroke; history pulmonary hypertension, atrial fibrillation, hypertension, stage III chronic kidney disease, CHF EXAM: CT HEAD WITHOUT CONTRAST TECHNIQUE: Contiguous axial images were obtained from the base of the skull through the vertex without intravenous contrast. Sagittal and coronal MPR images reconstructed from axial data set.  COMPARISON:  None FINDINGS: Brain: Generalized atrophy. Normal ventricular morphology. High attenuation acute intraparenchymal hemorrhage identified at LEFT vertex 2.2 x 1.7 x 2.4 cm in size with surrounding vasogenic edema. Extra-axial extension of hemorrhage along the falx and medial sulci to LEFT vertex. No midline shift. Additional intraparenchymal hemorrhage at LEFT temporal lobe 11 x 6 x 10 mm. These could be due to hemorrhagic metastases, less likely hemorrhagic infarct. No additional infarcts, mass, or hemorrhage identified. Vascular: No hyperdense vessels Skull: Intact Sinuses/Orbits: Clear Other: N/A IMPRESSION: Acute intraparenchymal hemorrhage at LEFT vertex 2.2 x 1.7 x 2.4 cm in size with surrounding vasogenic edema. Extra-axial extension of hemorrhage along the falx and interhemispheric fissure. Additional intraparenchymal hemorrhage at LEFT temporal lobe 11 x 6 x 10 mm. Findings are concerning for hemorrhagic metastases and further assessment by MR imaging with and without contrast recommended. No midline shift or hydrocephalus. Critical Value/emergent results were called by telephone at the time of interpretation on 07/22/2020 at 5:28 pm to provider [facility] , who verbally acknowledged these results. Electronically Signed   By: Lavonia Dana M.D.   On: 07/22/2020 17:30   MR BRAIN W WO CONTRAST  Result Date: 07/22/2020 CLINICAL DATA:  Multifocal intracranial hemorrhage EXAM: MRI HEAD WITHOUT AND WITH CONTRAST TECHNIQUE: Multiplanar, multiecho pulse sequences of the brain and surrounding  structures were obtained without and with intravenous contrast. CONTRAST:  8mL GADAVIST GADOBUTROL 1 MMOL/ML IV SOLN COMPARISON:  Head CT 07/22/2020 Brain MRI 04/30/2012 FINDINGS: Brain: Unchanged intraparenchymal hemorrhage in the left parietal lobe and left temporal lobe. Mild edema at both locations, greater in the parietal lobe. No acute ischemia. No midline shift or other mass effect. Multifocal white matter  hyperintensity, most commonly due to chronic ischemic microangiopathy. No hydrocephalus. There is a small amount of subarachnoid blood over the left temporal convexity. No chronic microhemorrhage. Normal midline structures. Postcontrast images are motion degraded there is no appreciable contrast enhancement at the sites of hemorrhage. Vascular: Normal flow voids. Skull and upper cervical spine: Normal marrow signal. Sinuses/Orbits: Negative. Other: None. IMPRESSION: 1. Unchanged intraparenchymal hemorrhage in the left parietal lobe and left temporal lobe with mild edema. 2. No abnormal contrast enhancement at either hemorrhage site to indicate the presence of a mass. Electronically Signed   By: Ulyses Jarred M.D.   On: 07/22/2020 21:49   EEG adult  Result Date: 07/22/2020 Lora Havens, MD     07/23/2020  9:06 AM Patient Name: AANSHI BATCHELDER MRN: [id number] Epilepsy Attending: Lora Havens Referring Physician/Provider: Dr Donnetta Simpers Date: 07/22/2020 Duration: 24.02 mins Patient history: 86 y/o F on Warfarin p/w L frontomedial ICH and another smaller L temporooccipital ICH with associated vasogenic edema. On exam noted to have R leg and arm twitching, Unable to abduct R eye and adduct Left eye. EEG to evaluate for seizure Level of alertness: lethargic AEDs during EEG study: Keppra, ativan Technical aspects: This EEG study was done with scalp electrodes positioned according to the 10-20 International system of electrode placement. Electrical activity was acquired at a sampling rate of 500Hz  and reviewed with a high frequency filter of 70Hz  and a low frequency filter of 1Hz . EEG data were recorded continuously and digitally stored. Description: No posterior dominant rhythm was seen. EEG showed continuous generalized 3 to 6 Hz theta-delta slowing admixed with15 to 18 Hz beta activity with irregular morphology distributed symmetrically and diffusely.  Hyperventilation and photic stimulation were not  performed.   ABNORMALITY -Excessive beta, generalized -Continuous slow, generalized IMPRESSION: This study is suggestive of moderate diffuse encephalopathy, nonspecific etiology but likely related to sedation, post-ictal state. The excessive beta activity seen in the background is most likely due to the effect of benzodiazepine and is a benign EEG pattern. No seizures or definite epileptiform discharges were seen throughout the recording. Priyanka [name]   Overnight EEG with video  Result Date: 07/23/2020 Lora Havens, MD     07/23/2020  9:41 AM Patient Name: LENZI MARMO MRN: [id number] Epilepsy Attending: Lora Havens Referring Physician/Provider: Dr Donnetta Simpers Duration: 07/22/2020 2303 to 07/23/2020 0930  Patient history: 86 y/o F on Warfarin p/w L frontomedial ICH and another smaller L temporooccipital ICH with associated vasogenic edema.On exam noted to have R leg and arm twitching, Unable to abduct R eye and adduct Left eye. EEG to evaluate for seizure  Level of alertness: lethargic, asleep  AEDs during EEG study: Keppra, ativan  Technical aspects: This EEG study was done with scalp electrodes positioned according to the 10-20 International system of electrode placement. Electrical activity was acquired at a sampling rate of 500Hz  and reviewed with a high frequency filter of 70Hz  and a low frequency filter of 1Hz . EEG data were recorded continuously and digitally stored.  Description: No posterior dominant rhythm was seen.   Sleep was characterized by vertex waves,  sleep spindles (12 to 14 Hz), maximal frontocentral region.  EEG showed continuous generalized 3 to 6 Hz theta-delta slowing admixed with15 to 18 Hz beta activity with irregular morphology distributed symmetrically and diffusely.  Hyperventilation and photic stimulation were not performed.  ABNORMALITY -Excessive beta, generalized -Continuous slow, generalized  IMPRESSION: This study is suggestive of moderate diffuse  encephalopathy, nonspecific etiology but likely related to sedation, post-ictal state. The excessive beta activity seen in the background is most likely due to the effect of benzodiazepine and is a benign EEG pattern. No seizures or definite epileptiform discharges were seen throughout the recording.   Priyanka [name]    PHYSICAL EXAM  Temp:  [97.4 F (36.3 C)-98.7 F (37.1 C)] 97.4 F (36.3 C) (07/26 0621) Pulse Rate:  [50-156] 64 (07/26 0640) Resp:  [0-25] 19 (07/26 0640) BP: (100-156)/(63-125) 156/87 (07/26 0640) SpO2:  [91 %-97 %] 95 % (07/26 0640) Weight:  [69.4 kg] 69.4 kg (07/25 1533)  General - Well nourished, well developed, in no apparent distress.  Ophthalmologic - fundi not visualized due to noncooperation.  Cardiovascular - irregularly irregular heart rate and rhythm  Neuro - awake, eyes open, paucity of speech but able to name and repeat only 3-word sentence, with perseveration. Orientated to age and month but not to place or year. Follows simple commands. Not blinking to visual threat bilaterally but able to count fingers with confrontation but perseverated on the numbers. No gaze palsy, facial symmetrical, tongue midline. LUE and LLE at least 4/5, RUE and RLE drift but able to against gravity more than 10/5 seconds. Sensation subjectively symmetrical. FTN not cooperative and gait not tested.   ASSESSMENT/PLAN Ms. Lori Bridges is a 86 y.o. female with history of atrial fibrillation on coumadin, hypertension, hyperlipidemia, hypothyroidism and chronic kidney disease presenting with speech difficulty, confusion.   Stroke:  Small L frontomedial ICH and L temporooccipital ICH in pt w/ chronic AF on warfarin (subtherapeutic), likely hypertensive   CT head L vertex ICH w/ vasogenic edema. Extra axial extenion along falx and interhemispheric fissure, L temporal lobe hemorrhage. Concern for mets.  Warfarin reversal w/ Kcentra, Vit K  Treated w/ decadron d/t possible  mets  MRI w/w/o  Unchanged L parietal and L temporal lobe hemorrhage w/ mild edema. No mass. No clear CAA  CTA head pending  Carotid doppler pending   Recent 2D Echo EF 60-65%. AF. LA severely dilated.  LDL 140  HgbA1c 5.7  VTE prophylaxis - SCDs   warfarin daily prior to admission, now on No antithrombotic given hemorrhage.   Therapy recommendations:  pending   Disposition:  pending   Seizure  R leg and arm twitching.  Ativan 2 x 1  Loaded w/ Keppra 1500 -> 500 bid  EEG  Excessive beta, generalized slowing  LT EEG no seizure  Permanent Atrial Fibrillation  Home anticoagulation:  warfarin daily   INR on arrival 1.6 ->1.5->1.4  S/p Kcentra, Vit K  Hypertension  Home meds:  Lasix 20, hydralazine 25, irbesartan 150, metoprolol 100 am + 25 pm  BP 150s on admission   BP now stable, off cardene . SBP goal < 160 . Long-term BP goal normotensive  Hyperlipidemia  Home meds:  No statin  Intolerant to multiple statins and zetia  LDL 140  Hold statin in setting of ICH and hx intolerance  Dysphagia . Secondary to stroke . NPO . Speech on board . On IVF   Other Stroke Risk Factors  Advanced age  Other Active  Problems  CKD stage IIIa, Cre 1.22  Hypothyroid on synthroid PTA  Hospital day # 1  This patient is critically ill due to multifocal ICH, hypertensive emergency, afib and at significant risk of neurological worsening, death form recurrent bleeding, hematoma enlargement, heart failure, ischemic stroke. This patient's care requires constant monitoring of vital signs, hemodynamics, respiratory and cardiac monitoring, review of multiple databases, neurological assessment, discussion with family, other specialists and medical decision making of high complexity. I spent 35 minutes of neurocritical care time in the care of this patient. I had long discussion with daughter at bedside, updated pt current condition, treatment plan and potential prognosis,  and answered all the questions. She expressed understanding and appreciation.   Rosalin Hawking, MD PhD Stroke Neurology 07/23/2020 11:33 AM    To contact Stroke Continuity provider, please refer to [email or web address] After hours, contact General Neurology

## 2020-07-23 NOTE — Progress Notes (Signed)
PT Cancellation Note  Patient Details Name: Lori Bridges MRN: [id number] DOB: 1930-10-01   Cancelled Treatment:    Reason Eval/Treat Not Completed: Fatigue/lethargy limiting ability to participate; attempted to rouse pt in bed with vigorous stim, but not able to participate in mobility eval at low level of arousal.  Will attempt another day.   Reginia Naas 07/23/2020, 3:37 PM  Magda Kiel, PT Acute Rehabilitation Services [phone] [phone] 07/23/2020

## 2020-07-23 NOTE — Progress Notes (Signed)
Carotid duplex has been completed.   Preliminary results in CV Proc.   Abram Sander 07/23/2020 11:46 AM

## 2020-07-24 ENCOUNTER — Inpatient Hospital Stay (HOSPITAL_COMMUNITY): Payer: Medicare Other

## 2020-07-24 DIAGNOSIS — I611 Nontraumatic intracerebral hemorrhage in hemisphere, cortical: Principal | ICD-10-CM

## 2020-07-24 DIAGNOSIS — I4891 Unspecified atrial fibrillation: Secondary | ICD-10-CM

## 2020-07-24 DIAGNOSIS — E876 Hypokalemia: Secondary | ICD-10-CM

## 2020-07-24 LAB — CBC
HCT: 43.2 % (ref 36.0–46.0)
Hemoglobin: 13.8 g/dL (ref 12.0–15.0)
MCH: 29.6 pg (ref 26.0–34.0)
MCHC: 31.9 g/dL (ref 30.0–36.0)
MCV: 92.5 fL (ref 80.0–100.0)
Platelets: 148 10*3/uL — ABNORMAL LOW (ref 150–400)
RBC: 4.67 MIL/uL (ref 3.87–5.11)
RDW: 18.3 % — ABNORMAL HIGH (ref 11.5–15.5)
WBC: 3.9 10*3/uL — ABNORMAL LOW (ref 4.0–10.5)
nRBC: 0 % (ref 0.0–0.2)

## 2020-07-24 LAB — PREPARE FRESH FROZEN PLASMA: Unit division: 0

## 2020-07-24 LAB — URINALYSIS, ROUTINE W REFLEX MICROSCOPIC
Bilirubin Urine: NEGATIVE
Glucose, UA: NEGATIVE mg/dL
Ketones, ur: NEGATIVE mg/dL
Leukocytes,Ua: NEGATIVE
Nitrite: NEGATIVE
Protein, ur: 100 mg/dL — AB
RBC / HPF: >50 RBC/hpf — ABNORMAL HIGH (ref 0–5)
Specific Gravity, Urine: 1.035 — ABNORMAL HIGH (ref 1.005–1.030)
pH: 5 (ref 5.0–8.0)

## 2020-07-24 LAB — BASIC METABOLIC PANEL
Anion gap: 11 (ref 5–15)
BUN: 20 mg/dL (ref 8–23)
CO2: 21 mmol/L — ABNORMAL LOW (ref 22–32)
Calcium: 9.8 mg/dL (ref 8.9–10.3)
Chloride: 111 mmol/L (ref 98–111)
Creatinine, Ser: 1.28 mg/dL — ABNORMAL HIGH (ref 0.44–1.00)
GFR calc Af Amer: 43 mL/min — ABNORMAL LOW (ref >60)
GFR calc non Af Amer: 37 mL/min — ABNORMAL LOW (ref >60)
Glucose, Bld: 111 mg/dL — ABNORMAL HIGH (ref 70–99)
Potassium: 3.4 mmol/L — ABNORMAL LOW (ref 3.5–5.1)
Sodium: 143 mmol/L (ref 135–145)

## 2020-07-24 LAB — BPAM FFP
Blood Product Expiration Date: 202107282359
Blood Product Expiration Date: 202107292359
ISSUE DATE / TIME: 202107260536
Unit Type and Rh: 5100
Unit Type and Rh: 5100

## 2020-07-24 LAB — PROTIME-INR
INR: 1.4 — ABNORMAL HIGH (ref 0.8–1.2)
Prothrombin Time: 16.9 seconds — ABNORMAL HIGH (ref 11.4–15.2)

## 2020-07-24 LAB — TYPE AND SCREEN
ABO/RH(D): O POS
Antibody Screen: NEGATIVE

## 2020-07-24 LAB — GLUCOSE, CAPILLARY
Glucose-Capillary: 77 mg/dL (ref 70–99)
Glucose-Capillary: 82 mg/dL (ref 70–99)
Glucose-Capillary: 86 mg/dL (ref 70–99)

## 2020-07-24 MED ORDER — LORAZEPAM 2 MG/ML IJ SOLN
2.0000 mg | Freq: Once | INTRAMUSCULAR | Status: AC
  Administered 2020-07-24: 2 mg via INTRAVENOUS
  Filled 2020-07-24: qty 1

## 2020-07-24 MED ORDER — LEVETIRACETAM IN NACL 1000 MG/100ML IV SOLN
1000.0000 mg | Freq: Two times a day (BID) | INTRAVENOUS | Status: DC
  Administered 2020-07-24 – 2020-07-25 (×3): 1000 mg via INTRAVENOUS
  Filled 2020-07-24 (×3): qty 100

## 2020-07-24 MED ORDER — SODIUM CHLORIDE 0.9 % IV SOLN
1.0000 g | INTRAVENOUS | Status: DC
  Administered 2020-07-24 – 2020-07-26 (×3): 1 g via INTRAVENOUS
  Filled 2020-07-24: qty 10
  Filled 2020-07-24 (×2): qty 1

## 2020-07-24 NOTE — Evaluation (Addendum)
Occupational Therapy Evaluation Patient Details Name: Lori Bridges MRN: [id number] DOB: 1930/04/04 Today's Date: 07/24/2020    History of Present Illness 86 yo female admitted with L frontomedial ICH and L temporooccipital ICH, seizure and dysphagia PMH chronic afib on warfarin HTn HLD . Lived at home with grandson, amb with walker and was indep with ADLs.   Clinical Impression   PT admitted with CVA. Pt currently with functional limitiations due to the deficits listed below (see OT problem list). Pt with L inattention, R UE ataxic movement, visual deficits and decrease balance Pt will benefit from skilled OT to increase their independence and safety with adls and balance to allow discharge CIR.     Follow Up Recommendations  CIR    Equipment Recommendations  Wheelchair (measurements OT);Wheelchair cushion (measurements OT);Hospital bed    Recommendations for Other Services Rehab consult     Precautions / Restrictions Precautions Precautions: Fall Precaution Comments: R increased tone, hold R UE and LE in flexion, HOH      Mobility Bed Mobility Overal bed mobility: Needs Assistance Bed Mobility: Rolling;Sidelying to Sit Rolling: Mod assist;Max assist Sidelying to sit: Mod assist;Max assist       General bed mobility comments: max directional verbal cues, maxA for LE management off bed, modA for trunk elevation, pt assisted with transfer once initiated   Transfers Overall transfer level: Needs assistance Equipment used:  (2 person lift with gait belt) Transfers: Stand Pivot Transfers;Sit to/from Stand Sit to Stand: Max assist;+2 physical assistance Stand pivot transfers: Max assist;+2 physical assistance       General transfer comment: R knee blocked, dependence for R LE advancement, pt unable to sequence stepping, maxAx2 to maintain upright posture    Balance Overall balance assessment: Needs assistance Sitting-balance support: Feet supported;No upper extremity  supported Sitting balance-Leahy Scale: Poor Sitting balance - Comments: pt initially requiring maxA to maintain EOB balance, pt with strong posterior bias. Pt with periodic episodes of sitting with close min guard for about 30 sec Postural control: Posterior lean Standing balance support: Bilateral upper extremity supported Standing balance-Leahy Scale: Zero Standing balance comment: dependent on physical assist                           ADL either performed or assessed with clinical judgement   ADL Overall ADL's : Needs assistance/impaired     Grooming: Maximal assistance               Lower Body Dressing: Maximal assistance;Sitting/lateral leans Lower Body Dressing Details (indicate cue type and reason): pt using bil Ue to open sock but unable to loop foot into the sock.pt was able to figure 4 R LE but needs (A) to position L LE. pt was unable to continue to pull up L sock. Pt was able to continue sequence on R LE.                General ADL Comments: pt requires (A) for static sitting balance and then progressed to LB dressing task. PT continued to move R sid einto flexion patterns throughout session. pt was abl to move out of them but holding them in that position at rest     Vision         Perception     Praxis      Pertinent Vitals/Pain Faces Pain Scale: No hurt     Hand Dominance     Extremity/Trunk Assessment     Lower  Extremity Assessment RLE Deficits / Details: hold R LE in hip flexion and external rotation with foot next to L knee (figure 4 position) pt appears to have incresaed flexor tone, does move it actively however not as much as L LLE Deficits / Details: delayed processing and squencing   Cervical / Trunk Assessment Cervical / Trunk Assessment: Kyphotic   Communication Communication Communication: Expressive difficulties   Cognition Arousal/Alertness: Awake/alert Behavior During Therapy: Flat affect Overall Cognitive Status:  Impaired/Different from baseline Area of Impairment: Orientation;Attention;Memory;Following commands;Safety/judgement;Awareness;Problem solving                 Orientation Level: Disoriented to;Place;Time;Situation Current Attention Level: Focused Memory: Decreased short-term memory Following Commands: Follows one step commands with increased time;Follows one step commands inconsistently (simple commands about 75% of time) Safety/Judgement: Decreased awareness of deficits Awareness: Intellectual Problem Solving: Decreased initiation;Difficulty sequencing;Slow processing;Requires verbal cues;Requires tactile cues General Comments: pt with severe word finding difficulty, despite incresaed tone in R UE and LE pt more apt to follow commands with R UE versus L UE. With tactile cues pt will follow commands with L UE   General Comments       Exercises     Shoulder Instructions      Home Living Family/patient expects to be discharged to:: Private residence Living Arrangements:  (grandson) Available Help at Discharge: Family;Available 24 hours/day Type of Home: House                           Additional Comments: pt poor historian, info provided by chart      Prior Functioning/Environment Level of Independence: Independent with assistive device(s)        Comments: per chart, was walking with RW, was able dress and bath self, per chart per daughter states her memory has started declining and she has started managing pt's finances        OT Problem List:        OT Treatment/Interventions:      OT Goals(Current goals can be found in the care plan section)    OT Frequency: Min 2X/week   Barriers to D/C:            Co-evaluation PT/OT/SLP Co-Evaluation/Treatment: Yes Reason for Co-Treatment: To address functional/ADL transfers;For patient/therapist safety;Necessary to address cognition/behavior during functional activity   OT goals addressed during session:  ADL's and self-care;Proper use of Adaptive equipment and DME;Strengthening/ROM      AM-PAC OT "6 Clicks" Daily Activity     Outcome Measure Help from another person eating meals?: Total Help from another person taking care of personal grooming?: Total Help from another person toileting, which includes using toliet, bedpan, or urinal?: Total Help from another person bathing (including washing, rinsing, drying)?: Total Help from another person to put on and taking off regular upper body clothing?: Total Help from another person to put on and taking off regular lower body clothing?: Total 6 Click Score: 6   End of Session Equipment Utilized During Treatment: Gait belt Nurse Communication: Mobility status;Precautions  Activity Tolerance: Patient tolerated treatment well Patient left: in chair;with call bell/phone within reach;with chair alarm set  OT Visit Diagnosis: Muscle weakness (generalized) (M62.81)                Time: 1275-1700 OT Time Calculation (min): 22 min Charges:  OT General Charges $OT Visit: 1 Visit OT Evaluation $OT Eval Moderate Complexity: 1 Mod   Brynn, OTR/L  Acute Rehabilitation Services Pager:  [phone] Office: [phone] .   Lori Bridges 07/24/2020, 4:27 PM

## 2020-07-24 NOTE — Progress Notes (Signed)
STAT EEG complete - results pending. ? ?

## 2020-07-24 NOTE — Consult Note (Signed)
Physical Medicine and Rehabilitation Consult Reason for Consult: Decreased functional mobility with slurred speech Referring Physician: Dr.Xu   HPI: Lori Bridges is a 86 y.o. right-handed female with history of atrial fibrillation maintained on Coumadin, hypertension, hyperlipidemia, chronic kidney disease.  Per chart review patient lives with her grandson.  Independent with assistive device and was able to dress and bathe herself although family notes some recent decrease in memory.  She has 7 children in the area that check on her routinely.  Presented 07/23/2020 with increasing confusion and slurred speech as well as questionable seizure.  CT/MRI showed acute intraparenchymal hemorrhage in the left vertex 2.2 x 1.7 x 2.4 cm in size with surrounding vasogenic edema.  Additional intraparenchymal hemorrhage in the left temporal lobe 11 x 6 x 10 mm.  No midline shift or hydrocephalus.  Admission chemistries alcohol negative, SARS coronavirus negative, potassium 3.2, glucose 126, creatinine 1.22 INR 1.5 Patient was loaded with Keppra with EEG pending.  Cardene drip for blood pressure control.  Therapy evaluations completed with recommendations of physical medicine rehab consult.   Review of Systems  Constitutional: Negative for chills and fever.  HENT: Negative for hearing loss.   Eyes: Negative for blurred vision and double vision.  Respiratory: Negative for shortness of breath.   Cardiovascular: Negative for chest pain.  Gastrointestinal: Positive for constipation. Negative for heartburn and nausea.       GERD  Genitourinary: Negative for dysuria, flank pain and hematuria.  Musculoskeletal: Positive for myalgias.  Skin: Negative for rash.  Neurological: Positive for speech change.  Psychiatric/Behavioral: Positive for memory loss.       Anxiety  All other systems reviewed and are negative.  Past Medical History:  Diagnosis Date  . Adenomatous colon polyp 2005  . Anxiety age 4    . Carotid artery bruit 2001   left  . CKD (chronic kidney disease), stage III   . DDD (degenerative disc disease)   . DJD (degenerative joint disease)   . Gastroenteritis 2011  . GERD (gastroesophageal reflux disease) 2003  . History of pneumonia   . HTN (hypertension) 1983  . Hyperlipidemia 2000  . Hypothyroidism 2009  . Iron deficiency anemia 1983  . Osteoarthritis 2001  . Osteoporosis 2001  . Permanent atrial fibrillation ([place]) 07/23/2010       . Persistent headaches 1987  . Pulmonary HTN ([place]) 04/03/2017   Moderate with PASP 67mmHg by echo 02/2017  . Tricuspid regurgitation    severe by echo 02/25/12  . Vertigo   . Vitamin D deficiency 2011   Past Surgical History:  Procedure Laterality Date  . COLONOSCOPY W/ BIOPSIES AND POLYPECTOMY  09/16/2004   adenomatous polyp, diverticulosis, internal hemorrhoids  . s/p BLT    . TUBAL LIGATION     Family History  Problem Relation Age of Onset  . Hypertension Father   . Peripheral vascular disease Father   . Diabetes Mother   . Heart disease Brother 80  . Liver cancer Sister    Social History:  reports that she has never smoked. She has never used smokeless tobacco. She reports that she does not drink alcohol and does not use drugs. Allergies:  Allergies  Allergen Reactions  . Doxycycline Other (See Comments)    UNKNOWN unknown  . Ezetimibe Other (See Comments)    Arthralgia, Myalgias  . Meclizine Other (See Comments) and Palpitations    Tachycardia  . Pravastatin Other (See Comments)    Arthralgia, Myalgias  .  Colestipol Other (See Comments)  . Sulfamethoxazole Other (See Comments)    unknown  . Ace Inhibitors Cough  . Codeine Nausea Only  . Colestipol Hcl Other (See Comments)    Malaise  . Lovastatin Other (See Comments)    UNKNOWN Unknown  . Sulfa Antibiotics Other (See Comments)    unknown   Medications Prior to Admission  Medication Sig Dispense Refill  . Cholecalciferol (VITAMIN D3) 2000 units TABS Take  2,000 Units by mouth daily.    . Cyanocobalamin (VITAMIN B-12 PO) Take 1 tablet by mouth daily.    . furosemide (LASIX) 20 MG tablet Take 1 tablet (20 mg total) by mouth daily. 90 tablet 3  . hydrALAZINE (APRESOLINE) 25 MG tablet Take 1 tablet (25 mg total) by mouth in the morning and at bedtime. (Patient taking differently: Take 25 mg by mouth daily. ) 60 tablet 11  . irbesartan (AVAPRO) 150 MG tablet Take 1 tablet (150 mg total) by mouth daily. 30 tablet 11  . LORazepam (ATIVAN) 1 MG tablet Take 0.5 mg by mouth at bedtime.     . metoprolol succinate (TOPROL XL) 100 MG 24 hr tablet TAKE 1 TABLET(100 MG) BY MOUTH DAILY IN THE MORNING. (Patient taking differently: Take 100 mg by mouth daily. ) 90 tablet 3  . metoprolol succinate (TOPROL XL) 25 MG 24 hr tablet Take 2 tablets (50 mg total) by mouth at bedtime. 180 tablet 3  . pantoprazole (PROTONIX) 40 MG tablet Take 40 mg by mouth daily.     Marland Kitchen SYNTHROID 50 MCG tablet Take 50 mcg by mouth daily before breakfast.   8  . warfarin (COUMADIN) 2 MG tablet Take 1 tablet (2 mg total) by mouth daily. Or as directed by the coumadin clinic (Patient taking differently: Take 2-3 mg by mouth See admin instructions. Take 1 and 1/2 tablet on Sunday then take 1 tablet all the other days) 35 tablet 2    Home[place] expects to be discharged to:: Private residence Living Arrangements:  (grandson) Available Help at Discharge: Family, Available 24 hours/day Type of Home: House Additional Comments: pt poor historian, info provided by chart  Functional History: Prior Function Level of Independence: Independent with assistive device(s) Comments: per chart, was walking with RW, was able dress and bath self, per chart per daughter states her memory has started declining and she has started managing pt's finances Functional Status:  Mobility: Bed Mobility Overal bed mobility: Needs Assistance Bed Mobility: Rolling, Sidelying to Sit Rolling: Mod  assist, Max assist Sidelying to sit: Mod assist, Max assist General bed mobility comments: max directional verbal cues, maxA for LE management off bed, modA for trunk elevation, pt assisted with transfer once initiated  Transfers Overall transfer level: Needs assistance Equipment used:  (2 person lift with gait belt) Transfers: Stand Pivot Transfers, Sit to/from Stand Sit to Stand: Max assist, +2 physical assistance Stand pivot transfers: Max assist, +2 physical assistance General transfer comment: R knee blocked, dependence for R LE advancement, pt unable to sequence stepping, maxAx2 to maintain upright posture Ambulation/Gait General Gait Details: unable at this time    ADL:    Cognition: Cognition Overall Cognitive Status: Impaired/Different from baseline (assessment also limited by language deficits) Arousal/Alertness: Awake/alert Orientation Level:  (difficulty stating) Awareness: Impaired Awareness Impairment: Emergent impairment Problem Solving: Impaired Problem Solving Impairment: Functional basic Cognition Arousal/Alertness: Awake/alert Behavior During Therapy: Flat affect Overall Cognitive Status: Impaired/Different from baseline (assessment also limited by language deficits) Area of Impairment: Orientation,  Attention, Memory, Following commands, Safety/judgement, Awareness, Problem solving Orientation Level: Disoriented to, Place, Time, Situation Current Attention Level: Focused Memory: Decreased short-term memory Following Commands: Follows one step commands with increased time, Follows one step commands inconsistently (simple commands about 75% of time) Safety/Judgement: Decreased awareness of deficits Awareness: Intellectual Problem Solving: Decreased initiation, Difficulty sequencing, Slow processing, Requires verbal cues, Requires tactile cues General Comments: pt with severe word finding difficulty, despite incresaed tone in R UE and LE pt more apt to follow  commands with R UE versus L UE. With tactile cues pt will follow commands with L UE  Blood pressure (!) 116/62, pulse (!) 33, temperature (!) 97.4 F (36.3 C), temperature source Axillary, resp. rate 17, weight 69.4 kg, SpO2 97 %. Physical Exam  General: No apparent distress HEENT: Head is normocephalic, atraumatic, eyes mostly closed, when open has palsy of R lateral rectus and left medial rectus. Neck: Supple without JVD or lymphadenopathy Heart: Irregularly irregular rate and rhythm. No murmurs rubs or gallops Chest: CTA bilaterally without wheezes, rales, or rhonchi; no distress Abdomen: Soft, non-tender, non-distended, bowel sounds positive. Extremities: No clubbing, cyanosis, or edema. Pulses are 2+ Skin: Clean and intact without signs of breakdown Neuro: Patient is awake and alert, but eyes are mostly closed.  She does make eye contact with examiner.  Patient is aphasic but was able to state her name.  Most of the words were mumbling- expressive aphasia. Inconsistent to follow commands. Unable to engage in MMT  Psych: Pt's affect is appropriate. Pt is cooperative  Results for orders placed or performed during the hospital encounter of 07/22/20 (from the past 24 hour(s))  Protime-INR     Status: Abnormal   Collection Time: 07/23/20  2:03 PM  Result Value Ref Range   Prothrombin Time 16.1 (H) 11.4 - 15.2 seconds   INR 1.3 (H) 0.8 - 1.2  MRSA PCR Screening     Status: None   Collection Time: 07/23/20  3:06 PM   Specimen: Nasal Mucosa; Nasopharyngeal  Result Value Ref Range   MRSA by PCR NEGATIVE NEGATIVE  No blood products     Status: None   Collection Time: 07/23/20  6:29 PM  Result Value Ref Range   Transfuse no blood products      TRANSFUSE NO BLOOD PRODUCTS, VERIFIED BY Holland Commons, RN Performed at [facility], [address], [place], [place] [address]   Protime-INR     Status: Abnormal   Collection Time: 07/23/20  9:14 PM  Result Value Ref Range   Prothrombin  Time 16.7 (H) 11.4 - 15.2 seconds   INR 1.4 (H) 0.8 - 1.2  CBC     Status: Abnormal   Collection Time: 07/24/20  4:07 AM  Result Value Ref Range   WBC 3.9 (L) 4.0 - 10.5 K/uL   RBC 4.67 3.87 - 5.11 MIL/uL   Hemoglobin 13.8 12.0 - 15.0 g/dL   HCT 43.2 36 - 46 %   MCV 92.5 80.0 - 100.0 fL   MCH 29.6 26.0 - 34.0 pg   MCHC 31.9 30.0 - 36.0 g/dL   RDW 18.3 (H) 11.5 - 15.5 %   Platelets 148 (L) 150 - 400 K/uL   nRBC 0.0 0.0 - 0.2 %  Basic metabolic panel     Status: Abnormal   Collection Time: 07/24/20  4:07 AM  Result Value Ref Range   Sodium 143 135 - 145 mmol/L   Potassium 3.4 (L) 3.5 - 5.1 mmol/L  Chloride 111 98 - 111 mmol/L   CO2 21 (L) 22 - 32 mmol/L   Glucose, Bld 111 (H) 70 - 99 mg/dL   BUN 20 8 - 23 mg/dL   Creatinine, Ser 1.28 (H) 0.44 - 1.00 mg/dL   Calcium 9.8 8.9 - 10.3 mg/dL   GFR calc non Af Amer 37 (L) >60 mL/min   GFR calc Af Amer 43 (L) >60 mL/min   Anion gap 11 5 - 15  Protime-INR     Status: Abnormal   Collection Time: 07/24/20  4:07 AM  Result Value Ref Range   Prothrombin Time 16.9 (H) 11.4 - 15.2 seconds   INR 1.4 (H) 0.8 - 1.2  Urinalysis, Routine w reflex microscopic (not at [facility])     Status: Abnormal   Collection Time: 07/24/20 11:51 AM  Result Value Ref Range   Color, Urine AMBER (A) YELLOW   APPearance HAZY (A) CLEAR   Specific Gravity, Urine 1.035 (H) 1.005 - 1.030   pH 5.0 5.0 - 8.0   Glucose, UA NEGATIVE NEGATIVE mg/dL   Hgb urine dipstick LARGE (A) NEGATIVE   Bilirubin Urine NEGATIVE NEGATIVE   Ketones, ur NEGATIVE NEGATIVE mg/dL   Protein, ur 100 (A) NEGATIVE mg/dL   Nitrite NEGATIVE NEGATIVE   Leukocytes,Ua NEGATIVE NEGATIVE   RBC / HPF >50 (H) 0 - 5 RBC/hpf   WBC, UA 6-10 0 - 5 WBC/hpf   Bacteria, UA MANY (A) NONE SEEN   Mucus PRESENT    Hyaline Casts, UA PRESENT   BLOOD TRANSFUSION REPORT - SCANNED     Status: None   Collection Time: 07/24/20 12:08 PM   Narrative   Ordered by an unspecified provider.   CT ANGIO HEAD W OR WO  CONTRAST  Result Date: 07/23/2020 CLINICAL DATA:  Follow-up hemorrhage EXAM: CT ANGIOGRAPHY HEAD TECHNIQUE: Multidetector CT imaging of the head was performed using the standard protocol during bolus administration of intravenous contrast. Multiplanar CT image reconstructions and MIPs were obtained to evaluate the vascular anatomy. CONTRAST:  61mL OMNIPAQUE IOHEXOL 350 MG/ML SOLN COMPARISON:  07/22/2020 FINDINGS: CT HEAD Brain: Slight interval contraction of the hematoma in the medial left parietal lobe, measuring 23 x 24 x 18 mm compared with 25 x 23 x 19 mm yesterday. Amount of adjacent vasogenic edema appears similar. Small amount of adjacent subarachnoid blood is slightly reduced. No new brain abnormality. Atrophy and chronic small-vessel ischemic change of the white matter elsewhere. No hydrocephalus. Vascular: There is atherosclerotic calcification of the major vessels at the base of the brain. Skull: Negative Sinuses: Clear Orbits: Normal CTA HEAD Anterior circulation: Both internal carotid arteries are patent through the skull base and siphon regions. Ordinary siphon atherosclerotic calcification with maximal stenosis estimated at 30%. The anterior and middle cerebral vessels are patent. Both anterior cerebral is receive most of there supply from the left carotid circulation. Flow is present in the anterior and middle cerebral arteries. No sign of vascular malformation in the region of the left parietal hemorrhage. Additional intraparenchymal hemorrhage in the inferolateral left temporal lobe measuring maximally 11 mm. Minimal surrounding edema. No sign of enhancement or abnormal vessels in that region. Posterior circulation: Both vertebral arteries are patent to the basilar. No basilar stenosis. Posterior circulation branch vessels show flow. Venous sinuses: Early venous phase imaging does not allow accurate venography. I do not see any finding suggesting venous thrombosis, but this study does not  represent a true CT venogram. Anatomic variants: None significant. IMPRESSION: Slight contraction of the  left parietal hematoma and adjacent subarachnoid blood. No increase in edema or mass effect. No evidence of enhancing mass or high flow vascular malformation. Diminishing density of a hemorrhage in the left inferolateral temporal lobe, maximal dimension 11 mm. Very small amount of adjacent subarachnoid blood. No enhancement in this region. No new brain finding. Study does not serve as a true CT venogram. No positive evidence of venous thrombosis. This pattern of disease suggests that these may be post traumatic hemorrhages. Electronically Signed   By: Nelson Chimes M.D.   On: 07/23/2020 14:11   CT HEAD WO CONTRAST  Result Date: 07/22/2020 CLINICAL DATA:  Found at 1340 hours by daughter leaning against wall between the bed and a dresser, aphasic, question stroke; history pulmonary hypertension, atrial fibrillation, hypertension, stage III chronic kidney disease, CHF EXAM: CT HEAD WITHOUT CONTRAST TECHNIQUE: Contiguous axial images were obtained from the base of the skull through the vertex without intravenous contrast. Sagittal and coronal MPR images reconstructed from axial data set. COMPARISON:  None FINDINGS: Brain: Generalized atrophy. Normal ventricular morphology. High attenuation acute intraparenchymal hemorrhage identified at LEFT vertex 2.2 x 1.7 x 2.4 cm in size with surrounding vasogenic edema. Extra-axial extension of hemorrhage along the falx and medial sulci to LEFT vertex. No midline shift. Additional intraparenchymal hemorrhage at LEFT temporal lobe 11 x 6 x 10 mm. These could be due to hemorrhagic metastases, less likely hemorrhagic infarct. No additional infarcts, mass, or hemorrhage identified. Vascular: No hyperdense vessels Skull: Intact Sinuses/Orbits: Clear Other: N/A IMPRESSION: Acute intraparenchymal hemorrhage at LEFT vertex 2.2 x 1.7 x 2.4 cm in size with surrounding vasogenic edema.  Extra-axial extension of hemorrhage along the falx and interhemispheric fissure. Additional intraparenchymal hemorrhage at LEFT temporal lobe 11 x 6 x 10 mm. Findings are concerning for hemorrhagic metastases and further assessment by MR imaging with and without contrast recommended. No midline shift or hydrocephalus. Critical Value/emergent results were called by telephone at the time of interpretation on 07/22/2020 at 5:28 pm to provider [facility] , who verbally acknowledged these results. Electronically Signed   By: Lavonia Dana M.D.   On: 07/22/2020 17:30   MR BRAIN W WO CONTRAST  Result Date: 07/22/2020 CLINICAL DATA:  Multifocal intracranial hemorrhage EXAM: MRI HEAD WITHOUT AND WITH CONTRAST TECHNIQUE: Multiplanar, multiecho pulse sequences of the brain and surrounding structures were obtained without and with intravenous contrast. CONTRAST:  37mL GADAVIST GADOBUTROL 1 MMOL/ML IV SOLN COMPARISON:  Head CT 07/22/2020 Brain MRI 04/30/2012 FINDINGS: Brain: Unchanged intraparenchymal hemorrhage in the left parietal lobe and left temporal lobe. Mild edema at both locations, greater in the parietal lobe. No acute ischemia. No midline shift or other mass effect. Multifocal white matter hyperintensity, most commonly due to chronic ischemic microangiopathy. No hydrocephalus. There is a small amount of subarachnoid blood over the left temporal convexity. No chronic microhemorrhage. Normal midline structures. Postcontrast images are motion degraded there is no appreciable contrast enhancement at the sites of hemorrhage. Vascular: Normal flow voids. Skull and upper cervical spine: Normal marrow signal. Sinuses/Orbits: Negative. Other: None. IMPRESSION: 1. Unchanged intraparenchymal hemorrhage in the left parietal lobe and left temporal lobe with mild edema. 2. No abnormal contrast enhancement at either hemorrhage site to indicate the presence of a mass. Electronically Signed   By: Ulyses Jarred M.D.   On: 07/22/2020  21:49   EEG adult  Result Date: 07/22/2020 Lora Havens, MD     07/23/2020  9:06 AM Patient Name: TISHAWNA LAROUCHE MRN: [id number] Epilepsy Attending:  Lora Havens Referring Physician/Provider: Dr Donnetta Simpers Date: 07/22/2020 Duration: 24.02 mins Patient history: 86 y/o F on Warfarin p/w L frontomedial ICH and another smaller L temporooccipital ICH with associated vasogenic edema. On exam noted to have R leg and arm twitching, Unable to abduct R eye and adduct Left eye. EEG to evaluate for seizure Level of alertness: lethargic AEDs during EEG study: Keppra, ativan Technical aspects: This EEG study was done with scalp electrodes positioned according to the 10-20 International system of electrode placement. Electrical activity was acquired at a sampling rate of 500Hz  and reviewed with a high frequency filter of 70Hz  and a low frequency filter of 1Hz . EEG data were recorded continuously and digitally stored. Description: No posterior dominant rhythm was seen. EEG showed continuous generalized 3 to 6 Hz theta-delta slowing admixed with15 to 18 Hz beta activity with irregular morphology distributed symmetrically and diffusely.  Hyperventilation and photic stimulation were not performed.   ABNORMALITY -Excessive beta, generalized -Continuous slow, generalized IMPRESSION: This study is suggestive of moderate diffuse encephalopathy, nonspecific etiology but likely related to sedation, post-ictal state. The excessive beta activity seen in the background is most likely due to the effect of benzodiazepine and is a benign EEG pattern. No seizures or definite epileptiform discharges were seen throughout the recording. Priyanka [name]   Overnight EEG with video  Result Date: 07/23/2020 Lora Havens, MD     07/23/2020  9:41 AM Patient Name: SHYTERIA LEWIS MRN: [id number] Epilepsy Attending: Lora Havens Referring Physician/Provider: Dr Donnetta Simpers Duration: 07/22/2020 2303 to 07/23/2020 0930  Patient  history: 86 y/o F on Warfarin p/w L frontomedial ICH and another smaller L temporooccipital ICH with associated vasogenic edema.On exam noted to have R leg and arm twitching, Unable to abduct R eye and adduct Left eye. EEG to evaluate for seizure  Level of alertness: lethargic, asleep  AEDs during EEG study: Keppra, ativan  Technical aspects: This EEG study was done with scalp electrodes positioned according to the 10-20 International system of electrode placement. Electrical activity was acquired at a sampling rate of 500Hz  and reviewed with a high frequency filter of 70Hz  and a low frequency filter of 1Hz . EEG data were recorded continuously and digitally stored.  Description: No posterior dominant rhythm was seen.   Sleep was characterized by vertex waves, sleep spindles (12 to 14 Hz), maximal frontocentral region.  EEG showed continuous generalized 3 to 6 Hz theta-delta slowing admixed with15 to 18 Hz beta activity with irregular morphology distributed symmetrically and diffusely.  Hyperventilation and photic stimulation were not performed.  ABNORMALITY -Excessive beta, generalized -Continuous slow, generalized  IMPRESSION: This study is suggestive of moderate diffuse encephalopathy, nonspecific etiology but likely related to sedation, post-ictal state. The excessive beta activity seen in the background is most likely due to the effect of benzodiazepine and is a benign EEG pattern. No seizures or definite epileptiform discharges were seen throughout the recording.   Priyanka O Yadav   VAS US CAROTID  Result Date: 07/23/2020 Carotid Arterial Duplex Study Indications:       CVA. Risk Factors:      Hypertension, hyperlipidemia. Limitations        Today's exam was limited due to the patient's respiratory                    variation and patient positioning. Comparison Study:  no prior Performing Technologist: Abram Sander RVS  Examination Guidelines: A complete evaluation includes B-mode imaging,  spectral Doppler,  color Doppler, and power Doppler as needed of all accessible portions of each vessel. Bilateral testing is considered an integral part of a complete examination. Limited examinations for reoccurring indications may be performed as noted.  Right Carotid Findings: +----------+--------+--------+--------+------------------+--------------+           PSV cm/sEDV cm/sStenosisPlaque DescriptionComments       +----------+--------+--------+--------+------------------+--------------+ CCA Prox  34      7               heterogenous                     +----------+--------+--------+--------+------------------+--------------+ CCA Distal26      4               heterogenous                     +----------+--------+--------+--------+------------------+--------------+ ICA Prox  27      6       1-39%   heterogenous                     +----------+--------+--------+--------+------------------+--------------+ ICA Distal33      8                                                +----------+--------+--------+--------+------------------+--------------+ ECA                                                 Not visualized +----------+--------+--------+--------+------------------+--------------+ +----------+--------+-------+--------+-------------------+           PSV cm/sEDV cmsDescribeArm Pressure (mmHG) +----------+--------+-------+--------+-------------------+ [id number]                                         +----------+--------+-------+--------+-------------------+ +---------+--------+--------+--------------+ VertebralPSV cm/sEDV cm/sNot identified +---------+--------+--------+--------------+  Left Carotid Findings: +----------+--------+--------+--------+------------------+--------+           PSV cm/sEDV cm/sStenosisPlaque DescriptionComments +----------+--------+--------+--------+------------------+--------+ CCA Prox  49      9                heterogenous               +----------+--------+--------+--------+------------------+--------+ CCA Distal40      11              heterogenous               +----------+--------+--------+--------+------------------+--------+ ICA Prox  35      9       1-39%   heterogenous               +----------+--------+--------+--------+------------------+--------+ ICA Distal54      14                                         +----------+--------+--------+--------+------------------+--------+ ECA       25                                                 +----------+--------+--------+--------+------------------+--------+ +----------+--------+--------+--------+-------------------+  PSV cm/sEDV cm/sDescribeArm Pressure (mmHG) +----------+--------+--------+--------+-------------------+ [id number]                                          +----------+--------+--------+--------+-------------------+ +---------+--------+--+--------+-+---------+ VertebralPSV cm/s30EDV cm/s8Antegrade +---------+--------+--+--------+-+---------+   Summary: Right Carotid: Velocities in the right ICA are consistent with a 1-39% stenosis. Left Carotid: Velocities in the left ICA are consistent with a 1-39% stenosis. Vertebrals: Left vertebral artery demonstrates antegrade flow. Right vertebral             artery was not visualized. *See table(s) above for measurements and observations.  Electronically signed by Antony Contras MD on 07/23/2020 at 12:48:50 PM.    Final    Assessment/Plan: Diagnosis: Small left frontomedial ICH and left temporo-occipital ICH 1. Does the need for close, 24 hr/day medical supervision in concert with the patient's rehab needs make it unreasonable for this patient to be served in a less intensive setting? Yes 2. Co-Morbidities requiring supervision/potential complications: bilateral occipital cortical dysfunction, cerebral hemorrhage, moderate diffuse encephalopathy 3. Due  to bladder management, bowel management, safety, skin/wound care, disease management, medication administration, pain management and patient education, does the patient require 24 hr/day rehab nursing? Yes 4. Does the patient require coordinated care of a physician, rehab nurse, therapy disciplines of PT, OT, SLP to address physical and functional deficits in the context of the above medical diagnosis(es)? Yes Addressing deficits in the following areas: balance, endurance, locomotion, strength, transferring, bowel/bladder control, bathing, dressing, feeding, grooming, toileting, cognition, speech, language, swallowing and psychosocial support 5. Can the patient actively participate in an intensive therapy program of at least 3 hrs of therapy per day at least 5 days per week? Yes 6. The potential for patient to make measurable gains while on inpatient rehab is fair 7. Anticipated functional outcomes upon discharge from inpatient rehab are mod assist  with PT, mod assist with OT, mod assist with SLP. 8. Estimated rehab length of stay to reach the above functional goals is: 20-22 days 9. Anticipated discharge destination: Home 10. Overall Rehab/Functional Prognosis: fair  RECOMMENDATIONS: This patient's condition is appropriate for continued rehabilitative care in the following setting: CIR Patient has agreed to participate in recommended program. Yes Note that insurance prior authorization may be required for reimbursement for recommended care.  Comment: Mrs. Craw would be an appropriate CIR candidate if she has significant home assistance upon discharge. Can consider Baclofen 5mg  for increased tone on right side, but it may worsen cognitive deficits.   Lavon Paganini Angiulli, PA-C 07/24/2020   I have personally performed a face to face diagnostic evaluation, including, but not limited to relevant history and physical exam findings, of this patient and developed relevant assessment and plan.   Additionally, I have reviewed and concur with the physician assistant's documentation above.  Leeroy Cha, MD

## 2020-07-24 NOTE — Progress Notes (Signed)
STROKE TEAM PROGRESS NOTE   INTERVAL HISTORY Pt daughters are at bedside. Pt sitting in chair, eyes open, however, expressive aphasia, making sounds but intangible, only able to say her name but not other meaningful words. During encounter, pt has intermittent but frequent episode of right facial twitching, right UE tonic extension, raising up into air, followed by rhythmic jerking for 1 min or so then evolving into subtle hand and forearm jerking. No LE shaking or jerking but daughter stated that she also had leg shaking with sometimes whole body jerking for the last 20- 31min or so when she has been here with her. During the episode, pt eyes open, responding to voice, tracking bilaterally, able to follow limited simple commands. Pt symptoms concerning for partial seizure, ordered ativan 2mg  stat and increased keppra to 1000mg  bid with one dose now. Also ordered stat EEG.   Vitals:   07/24/20 0400 07/24/20 0500 07/24/20 0600 07/24/20 0800  BP: (!) 147/96 (!) 126/104 (!) 148/114   Pulse: 72 91 49   Resp: 20 22 13    Temp: (!) 97.5 F (36.4 C)   99.3 F (37.4 C)  TempSrc: Axillary   Oral  SpO2: 93% 92% 93%   Weight:       CBC:  Recent Labs  Lab 07/22/20 1535 07/22/20 1535 07/23/20 0200 07/24/20 0407  WBC 5.2   < > 4.1 3.9*  NEUTROABS 3.8  --   --   --   HGB 14.2   < > 13.5 13.8  HCT 45.0   < > 43.7 43.2  MCV 92.4   < > 94.8 92.5  PLT 162   < > PLATELET CLUMPS NOTED ON SMEAR, UNABLE TO ESTIMATE 148*   < > = values in this interval not displayed.   Basic Metabolic Panel:  Recent Labs  Lab 07/23/20 0200 07/24/20 0407  NA 141 143  K 3.2* 3.4*  CL 108 111  CO2 19* 21*  GLUCOSE 126* 111*  BUN 18 20  CREATININE 1.22* 1.28*  CALCIUM 10.3 9.8   Lipid Panel:  Recent Labs  Lab 07/23/20 0200  CHOL 205*  TRIG 52  HDL 55  CHOLHDL 3.7  VLDL 10  LDLCALC 140*   HgbA1c:  Recent Labs  Lab 07/23/20 0243  HGBA1C 5.7*   Urine Drug Screen: No results for input(s): LABOPIA,  COCAINSCRNUR, LABBENZ, AMPHETMU, THCU, LABBARB in the last 168 hours.  Alcohol Level  Recent Labs  Lab 07/22/20 1535  ETH <10    IMAGING past 24 hours CT ANGIO HEAD W OR WO CONTRAST  Result Date: 07/23/2020 CLINICAL DATA:  Follow-up hemorrhage EXAM: CT ANGIOGRAPHY HEAD TECHNIQUE: Multidetector CT imaging of the head was performed using the standard protocol during bolus administration of intravenous contrast. Multiplanar CT image reconstructions and MIPs were obtained to evaluate the vascular anatomy. CONTRAST:  54mL OMNIPAQUE IOHEXOL 350 MG/ML SOLN COMPARISON:  07/22/2020 FINDINGS: CT HEAD Brain: Slight interval contraction of the hematoma in the medial left parietal lobe, measuring 23 x 24 x 18 mm compared with 25 x 23 x 19 mm yesterday. Amount of adjacent vasogenic edema appears similar. Small amount of adjacent subarachnoid blood is slightly reduced. No new brain abnormality. Atrophy and chronic small-vessel ischemic change of the white matter elsewhere. No hydrocephalus. Vascular: There is atherosclerotic calcification of the major vessels at the base of the brain. Skull: Negative Sinuses: Clear Orbits: Normal CTA HEAD Anterior circulation: Both internal carotid arteries are patent through the skull base and siphon regions. Ordinary  siphon atherosclerotic calcification with maximal stenosis estimated at 30%. The anterior and middle cerebral vessels are patent. Both anterior cerebral is receive most of there supply from the left carotid circulation. Flow is present in the anterior and middle cerebral arteries. No sign of vascular malformation in the region of the left parietal hemorrhage. Additional intraparenchymal hemorrhage in the inferolateral left temporal lobe measuring maximally 11 mm. Minimal surrounding edema. No sign of enhancement or abnormal vessels in that region. Posterior circulation: Both vertebral arteries are patent to the basilar. No basilar stenosis. Posterior circulation branch  vessels show flow. Venous sinuses: Early venous phase imaging does not allow accurate venography. I do not see any finding suggesting venous thrombosis, but this study does not represent a true CT venogram. Anatomic variants: None significant. IMPRESSION: Slight contraction of the left parietal hematoma and adjacent subarachnoid blood. No increase in edema or mass effect. No evidence of enhancing mass or high flow vascular malformation. Diminishing density of a hemorrhage in the left inferolateral temporal lobe, maximal dimension 11 mm. Very small amount of adjacent subarachnoid blood. No enhancement in this region. No new brain finding. Study does not serve as a true CT venogram. No positive evidence of venous thrombosis. This pattern of disease suggests that these may be post traumatic hemorrhages. Electronically Signed   By: Nelson Chimes M.D.   On: 07/23/2020 14:11   VAS US CAROTID  Result Date: 07/23/2020 Carotid Arterial Duplex Study Indications:       CVA. Risk Factors:      Hypertension, hyperlipidemia. Limitations        Today's exam was limited due to the patient's respiratory                    variation and patient positioning. Comparison Study:  no prior Performing Technologist: Abram Sander RVS  Examination Guidelines: A complete evaluation includes B-mode imaging, spectral Doppler, color Doppler, and power Doppler as needed of all accessible portions of each vessel. Bilateral testing is considered an integral part of a complete examination. Limited examinations for reoccurring indications may be performed as noted.  Right Carotid Findings: +----------+--------+--------+--------+------------------+--------------+           PSV cm/sEDV cm/sStenosisPlaque DescriptionComments       +----------+--------+--------+--------+------------------+--------------+ CCA Prox  34      7               heterogenous                      +----------+--------+--------+--------+------------------+--------------+ CCA Distal26      4               heterogenous                     +----------+--------+--------+--------+------------------+--------------+ ICA Prox  27      6       1-39%   heterogenous                     +----------+--------+--------+--------+------------------+--------------+ ICA Distal33      8                                                +----------+--------+--------+--------+------------------+--------------+ ECA  Not visualized +----------+--------+--------+--------+------------------+--------------+ +----------+--------+-------+--------+-------------------+           PSV cm/sEDV cmsDescribeArm Pressure (mmHG) +----------+--------+-------+--------+-------------------+ [id number]                                         +----------+--------+-------+--------+-------------------+ +---------+--------+--------+--------------+ VertebralPSV cm/sEDV cm/sNot identified +---------+--------+--------+--------------+  Left Carotid Findings: +----------+--------+--------+--------+------------------+--------+           PSV cm/sEDV cm/sStenosisPlaque DescriptionComments +----------+--------+--------+--------+------------------+--------+ CCA Prox  49      9               heterogenous               +----------+--------+--------+--------+------------------+--------+ CCA Distal40      11              heterogenous               +----------+--------+--------+--------+------------------+--------+ ICA Prox  35      9       1-39%   heterogenous               +----------+--------+--------+--------+------------------+--------+ ICA Distal54      14                                         +----------+--------+--------+--------+------------------+--------+ ECA       25                                                  +----------+--------+--------+--------+------------------+--------+ +----------+--------+--------+--------+-------------------+           PSV cm/sEDV cm/sDescribeArm Pressure (mmHG) +----------+--------+--------+--------+-------------------+ [id number]                                          +----------+--------+--------+--------+-------------------+ +---------+--------+--+--------+-+---------+ VertebralPSV cm/s30EDV cm/s8Antegrade +---------+--------+--+--------+-+---------+   Summary: Right Carotid: Velocities in the right ICA are consistent with a 1-39% stenosis. Left Carotid: Velocities in the left ICA are consistent with a 1-39% stenosis. Vertebrals: Left vertebral artery demonstrates antegrade flow. Right vertebral             artery was not visualized. *See table(s) above for measurements and observations.  Electronically signed by Antony Contras MD on 07/23/2020 at 12:48:50 PM.    Final     PHYSICAL EXAM  Temp:  [97.4 F (36.3 C)-99.3 F (37.4 C)] 99.3 F (37.4 C) (07/27 0800) Pulse Rate:  [32-115] 49 (07/27 0600) Resp:  [12-30] 13 (07/27 0600) BP: (108-148)/(54-114) 148/114 (07/27 0600) SpO2:  [88 %-97 %] 93 % (07/27 0600)  General - Well nourished, well developed, in no apparent distress.  Ophthalmologic - fundi not visualized due to noncooperation.  Cardiovascular - irregularly irregular heart rate and rhythm  Neuro - awake, eyes open, able to say her name but otherwise intangible words, expressive aphasia, mumbling. Follows limited midline commands, but no peripheral commands. Not blinking to visual threat bilaterally but tracking bilaterally. No gaze palsy, facial symmetrical, tongue midline. LUE at least 4/5 and LLE at least 3/5, RUE drift but able to against gravity more than 10 seconds. RLE 2+/5 withdraw to pain. Sensation subjectively symmetrical. FTN not cooperative  and gait not tested. Observed intermittent but frequent episodes of right facial twitching,  right UE tonic extension, raising up into air, followed by rhythmic jerking for 1 min or so then evolving into subtle hand and forearm jerking.   ASSESSMENT/PLAN Ms. GRETTEL RAMES is a 86 y.o. female with history of atrial fibrillation on coumadin, hypertension, hyperlipidemia, hypothyroidism and chronic kidney disease presenting with speech difficulty, confusion.   Stroke:  Small L frontomedial ICH and L temporooccipital ICH in pt w/ chronic AF on warfarin (subtherapeutic), likely hypertensive   CT head L vertex ICH w/ vasogenic edema. Extra axial extenion along falx and interhemispheric fissure, L temporal lobe hemorrhage. Concern for mets.  Warfarin reversal w/ Kcentra, Vit K  Treated w/ decadron d/t possible mets  MRI w/w/o  Unchanged L parietal and L temporal lobe hemorrhage w/ mild edema. No mass. No clear CAA  CTA head no AVM, stable hematoma  Carotid doppler unremarkable  Recent 2D Echo EF 60-65%. AF. LA severely dilated.  LDL 140  HgbA1c 5.7  VTE prophylaxis - SCDs   warfarin daily prior to admission, now on No antithrombotic given hemorrhage.   Therapy recommendations:  pending   Disposition:  pending   Seizure  7/25 R leg and arm twitching in ED -> Ativan 2 x 1 -> Loaded w/ Keppra 1500 -> 500 bid  EEG  Excessive beta, generalized slowing  LT EEG 7/26 no seizure  7/27 intermittent but frequent episodes of right facial twitching, right UE tonic extension, raising up into air, followed by rhythmic jerking for 1 min or so then evolving into subtle hand and forearm jerking -> ativan 2mg  x1 ->increase keppra to 1000 bid  Stat EEG pending  Permanent Atrial Fibrillation  Home anticoagulation:  warfarin daily   INR on arrival 1.6 ->1.5->1.4->1.4  S/p Kcentra, Vit K  Hypertension  Home meds:  Lasix 20, hydralazine 25, irbesartan 150, metoprolol 100 am + 25 pm  BP 150s on admission   BP now 130-150, off cardene . SBP goal < 160 . Long-term BP goal  normotensive  Hyperlipidemia  Home meds:  No statin  Intolerant to multiple statins and zetia  LDL 140  Hold statin in setting of ICH and hx intolerance  Dysphagia . Secondary to stroke . NPO . Speech on board . On IVF   Other Stroke Risk Factors  Advanced age  Other Active Problems  CKD stage IIIa, Cre 1.22->1.28  Hypothyroid on synthroid PTA  Hospital day # 2  Patient continues to be critically ill for the last 24 hours, has developed partial seizure, continues to have HTN and dysphagia, and I have ordered ativan IV and increased keppra dose as well as stat EEG. Pt needs close ICU monitoring for seizure activity, BP management with IV meds. I discussed daughters at bedside, updated pt information and treatment plan, and answered all their questions. I spent 40 minutes of neurocritical care time in the care of this patient.  This patient is critically ill due to multifocal ICH, hypertensive emergency, afib and at significant risk of neurological worsening, death form recurrent bleeding, hematoma enlargement, heart failure, ischemic stroke. This patient's care requires constant monitoring of vital signs, hemodynamics, respiratory and cardiac monitoring, review of multiple databases, neurological assessment, discussion with family, other specialists and medical decision making of high complexity.  Rosalin Hawking, MD PhD Stroke Neurology 07/24/2020 10:00 AM    To contact Stroke Continuity provider, please refer to [email or web address] After hours, contact General Neurology

## 2020-07-24 NOTE — Evaluation (Signed)
Speech Language Pathology Evaluation Patient Details Name: Lori Bridges MRN: [id number] DOB: Oct 13, 1930 Today's Date: 07/24/2020 Time: 3154-0086 SLP Time Calculation (min) (ACUTE ONLY): 11 min  Problem List:  Patient Active Problem List   Diagnosis Date Noted  . ICH (intracerebral hemorrhage) ([place]) 07/22/2020  . Chronic diastolic CHF (congestive heart failure) ([place]) 04/03/2017  . Pulmonary HTN ([place]) 04/03/2017  . Chest pain 04/03/2017  . Dizziness 06/30/2016  . Encounter for therapeutic drug monitoring 01/24/2014  . Hyperlipidemia 08/24/2013  . Long term (current) use of anticoagulants 05/17/2013  . Tricuspid regurgitation 03/04/2012  . Nausea 02/16/2012  . Personal history of colonic polyps 10/09/2011  . HYPERTENSION, MILD 09/11/2010  . Permanent atrial fibrillation ([place]) 07/23/2010   Past Medical History:  Past Medical History:  Diagnosis Date  . Adenomatous colon polyp 2005  . Anxiety age 72  . Carotid artery bruit 2001   left  . CKD (chronic kidney disease), stage III   . DDD (degenerative disc disease)   . DJD (degenerative joint disease)   . Gastroenteritis 2011  . GERD (gastroesophageal reflux disease) 2003  . History of pneumonia   . HTN (hypertension) 1983  . Hyperlipidemia 2000  . Hypothyroidism 2009  . Iron deficiency anemia 1983  . Osteoarthritis 2001  . Osteoporosis 2001  . Permanent atrial fibrillation ([place]) 07/23/2010       . Persistent headaches 1987  . Pulmonary HTN ([place]) 04/03/2017   Moderate with PASP 5mmHg by echo 02/2017  . Tricuspid regurgitation    severe by echo 02/25/12  . Vertigo   . Vitamin D deficiency 2011   Past Surgical History:  Past Surgical History:  Procedure Laterality Date  . COLONOSCOPY W/ BIOPSIES AND POLYPECTOMY  09/16/2004   adenomatous polyp, diverticulosis, internal hemorrhoids  . s/p BLT    . TUBAL LIGATION     HPI:  86 yo female presenting with AMS and difficulty speaking. Pt admitted with L frontomedial ICH and L  temporooccipital ICH, seizure. PMH: GERD, PNA, chronic afib, HTN, HLD, mild memory difficulties   Assessment / Plan / Recommendation Clinical Impression  Pt has receptive and expressive difficulties, perseverating at times during command following and not consistently responding to yes/no questions. She did not state her name, but selected it with a "yes" when given multiple options via yes/no format. Other than the word "yes" noted several times, there rest of her expressive language was comprised of jargon without intelligible words. SLP also provided cues throughout evaluation for completion of one-step commands and initiation of functional tasks. She will benefit from ongoing SLP follow acutely and at next level of care.    SLP Assessment  SLP Recommendation/Assessment: Patient needs continued Speech Lanaguage Pathology Services SLP Visit Diagnosis: Cognitive communication deficit (R41.841)    Follow Up Recommendations  Inpatient Rehab    Frequency and Duration min 2x/week  2 weeks      SLP Evaluation Cognition  Overall Cognitive Status: Impaired/Different from baseline (assessment also limited by language deficits) Arousal/Alertness: Awake/alert Orientation Level:  (difficulty stating) Awareness: Impaired Awareness Impairment: Emergent impairment Problem Solving: Impaired Problem Solving Impairment: Functional basic       Comprehension  Auditory Comprehension Overall Auditory Comprehension: Impaired Yes/No Questions: Impaired Basic Biographical Questions: 26-50% accurate Basic Immediate Environment Questions: 0-24% accurate Commands: Impaired One Step Basic Commands: 25-49% accurate    Expression Expression Primary Mode of Expression: Verbal Verbal Expression Overall Verbal Expression: Impaired Initiation: No impairment Level of Generative/Spontaneous Verbalization: Word Repetition:  (repeated sounds) Pragmatics: Impairment Impairments:  Eye contact Non-Verbal Means  of Communication: Not applicable   Oral / Motor  Oral Motor/Sensory Function Overall Oral Motor/Sensory Function: Mild impairment Facial ROM: Within Functional Limits Facial Symmetry: Within Functional Limits Lingual Symmetry: Abnormal symmetry right;Suspected CN XII (hypoglossal) dysfunction Motor Speech Overall Motor Speech: Impaired Respiration: Within functional limits Phonation: Normal Resonance: Within functional limits Articulation: Impaired Level of Impairment: Word (needs more assessment when able to verbalize more)   GO                    Osie Bond., M.A. [place] [facility] Pager [phone] Office [phone]  07/24/2020, 10:38 AM

## 2020-07-24 NOTE — Progress Notes (Signed)
Inpatient Rehab Admissions Coordinator Note:   Per therapy recommendations, pt was screened for CIR candidacy by Shann Medal, PT, DPT.  At this time we are recommending a CIR consult.  I will place an order per our protocol.  Please contact me with questions.   Shann Medal, PT, DPT [phone] 07/24/20 12:42 PM

## 2020-07-24 NOTE — Evaluation (Signed)
Clinical/Bedside Swallow Evaluation Patient Details  Name: Lori Bridges MRN: [id number] Date of Birth: June 03, 1930  Today's Date: 07/24/2020 Time: SLP Start Time (ACUTE ONLY): 0949 SLP Stop Time (ACUTE ONLY): 0959 SLP Time Calculation (min) (ACUTE ONLY): 10 min  Past Medical History:  Past Medical History:  Diagnosis Date  . Adenomatous colon polyp 2005  . Anxiety age 86  . Carotid artery bruit 2001   left  . CKD (chronic kidney disease), stage III   . DDD (degenerative disc disease)   . DJD (degenerative joint disease)   . Gastroenteritis 2011  . GERD (gastroesophageal reflux disease) 2003  . History of pneumonia   . HTN (hypertension) 1983  . Hyperlipidemia 2000  . Hypothyroidism 2009  . Iron deficiency anemia 1983  . Osteoarthritis 2001  . Osteoporosis 2001  . Permanent atrial fibrillation ([place]) 07/23/2010       . Persistent headaches 1987  . Pulmonary HTN ([place]) 04/03/2017   Moderate with PASP 33mmHg by echo 02/2017  . Tricuspid regurgitation    severe by echo 02/25/12  . Vertigo   . Vitamin D deficiency 2011   Past Surgical History:  Past Surgical History:  Procedure Laterality Date  . COLONOSCOPY W/ BIOPSIES AND POLYPECTOMY  09/16/2004   adenomatous polyp, diverticulosis, internal hemorrhoids  . s/p BLT    . TUBAL LIGATION     HPI:  86 yo female presenting with AMS and difficulty speaking. Pt admitted with L frontomedial ICH and L temporooccipital ICH, seizure. PMH: GERD, PNA, chronic afib, HTN, HLD, mild memory difficulties   Assessment / Plan / Recommendation Clinical Impression  Pt has reduced awareness and head control for PO intake, requiring tactile cues and repositioning to keep head in a neutral position. Max cues for bolus acceptance were faded to Mod as trials progressed, but coughing is elicited with thin liquids and purees. Oral clearance is efficient. Her daughters share that at baseline she eats very soft textures because it was hard for her to chew  (?similar to Dys 2) and that she would sometimes cough while eating and drinking. They deny any recent episodes of PNA. Between signs concerning for aspiration, reduced bolus awareness, and concern for ongoing partial seizures, would not start PO diet today. Anticipate that she may need MBS when able to transport to radiology.   SLP Visit Diagnosis: Dysphagia, unspecified (R13.10)    Aspiration Risk  Moderate aspiration risk    Diet Recommendation NPO   Medication Administration: Via alternative means    Other  Recommendations Oral Care Recommendations: Oral care QID   Follow up Recommendations Inpatient Rehab      Frequency and Duration min 2x/week  2 weeks       Prognosis Prognosis for Safe Diet Advancement: Good Barriers to Reach Goals: Language deficits      Swallow Study   General HPI: 86 yo female presenting with AMS and difficulty speaking. Pt admitted with L frontomedial ICH and L temporooccipital ICH, seizure. PMH: GERD, PNA, chronic afib, HTN, HLD, mild memory difficulties Type of Study: Bedside Swallow Evaluation Previous Swallow Assessment: none in chart Diet Prior to this Study: NPO Temperature Spikes Noted: No Respiratory Status: Nasal cannula History of Recent Intubation: No Behavior/Cognition: Alert;Cooperative;Requires cueing Oral Cavity Assessment: Within Functional Limits Oral Care Completed by SLP: No Oral Cavity - Dentition: Dentures, top;Dentures, bottom Vision: Functional for self-feeding Self-Feeding Abilities: Total assist Patient Positioning: Upright in chair Baseline Vocal Quality: Normal Volitional Swallow: Unable to elicit    Oral/Motor/Sensory Function Overall  Oral Motor/Sensory Function: Mild impairment Facial ROM: Within Functional Limits Facial Symmetry: Within Functional Limits Lingual Symmetry: Abnormal symmetry right;Suspected CN XII (hypoglossal) dysfunction   Ice Chips Ice chips: Not tested (family declined, said she would not eat  them)   Thin Liquid Thin Liquid: Impaired Presentation: Cup;Self Fed;Spoon;Straw Oral Phase Impairments: Reduced labial seal;Poor awareness of bolus Pharyngeal  Phase Impairments: Cough - Immediate;Cough - Delayed    Nectar Thick Nectar Thick Liquid: Not tested   Honey Thick Honey Thick Liquid: Not tested   Puree Puree: Impaired Presentation: Spoon Oral Phase Impairments: Reduced labial seal;Poor awareness of bolus Pharyngeal Phase Impairments: Cough - Delayed   Solid     Solid: Not tested      Osie Bond., M.A. [place] Pager [phone] Office [phone]  07/24/2020,10:29 AM

## 2020-07-24 NOTE — Procedures (Addendum)
Patient Name:Lori Bridges JQD:[id number] Epilepsy Attending:Tonnie Stillman [name] Referring Physician/Provider:Dr Rosalin Hawking Date:07/24/2020  Duration: 23.40 mins  Patient history:86 y/o F on Warfarin p/w L frontomedial ICH and another smaller L temporooccipital ICH with associated vasogenic edema.On exam noted to haveR leg and arm twitching, Unable to abduct R eye and adduct Left eye. EEG to evaluate for seizure  Level of alertness:lethargic/ asleep  AEDs during EEG study:Keppra, ativan  Technical aspects: This EEG study was done with scalp electrodes positioned according to the 10-20 International system of electrode placement. Electrical activity was acquired at a sampling rate of 500Hz  and reviewed with a high frequency filter of 70Hz  and a low frequency filter of 1Hz . EEG data were recorded continuously and digitally stored.   Description:Noposterior dominant rhythm was seen.  Sleep was characterized by vertex waves, sleep spindles (12 to 14 Hz), maximal frontocentral region.  EEG showed continuous generalized and maximal left parieto-occipital region 3 to 6 Hz theta-delta slowing. Single sharp transient was seen in left posterior temporal region.  Hyperventilation and photic stimulation were not performed.   ABNORMALITY -Continuousslow, generalized and maximal left parieto-occipital region  IMPRESSION: This study is suggestive of cortical dysfunction in left parietal- occipital region likely secondary to underlying hemorrhage as well as moderate diffuse encephalopathy, nonspecific etiology but likely related to sedation,post-ictal state.No seizures ordefiniteepileptiform discharges were seen throughout the recording.   Tangelia Sanson [name]

## 2020-07-24 NOTE — Evaluation (Signed)
Physical Therapy Evaluation Patient Details Name: Lori Bridges MRN: [id number] DOB: 02-06-1930 Today's Date: 07/24/2020   History of Present Illness  86 yo female admitted with L frontomedial ICH and L temporooccipital ICH, seizure and dysphagia PMH chronic afib on warfarin HTn HLD . Lived at home with grandson, amb with walker and was indep with ADLs.    Clinical Impression  Pt admitted with above. Pt was indep with RW at home PTA. Pt now with severe word finding difficulty, impaired processing and sequencing, impaired balance, and now requires maxA for all mobility both in bed and OOB. Per chart pt with supportive family. Pt to benefit from CIR upon d/c to maximize functional recovery.    Follow Up Recommendations CIR    Equipment Recommendations  None recommended by PT (TBD)    Recommendations for Other Services Rehab consult     Precautions / Restrictions Precautions Precautions: Fall Precaution Comments: R increased tone, hold R UE and LE in flexion, HOH Restrictions Weight Bearing Restrictions: No      Mobility  Bed Mobility Overal bed mobility: Needs Assistance Bed Mobility: Rolling;Sidelying to Sit Rolling: Mod assist;Max assist Sidelying to sit: Mod assist;Max assist       General bed mobility comments: max directional verbal cues, maxA for LE management off bed, modA for trunk elevation, pt assisted with transfer once initiated   Transfers Overall transfer level: Needs assistance Equipment used:  (2 person lift with gait belt) Transfers: Stand Pivot Transfers;Sit to/from Stand Sit to Stand: Max assist;+2 physical assistance Stand pivot transfers: Max assist;+2 physical assistance       General transfer comment: R knee blocked, dependence for R LE advancement, pt unable to sequence stepping, maxAx2 to maintain upright posture  Ambulation/Gait             General Gait Details: unable at this time  Stairs            Wheelchair Mobility     Modified Rankin (Stroke Patients Only) Modified Rankin (Stroke Patients Only) Pre-Morbid Rankin Score: Slight disability Modified Rankin: Severe disability     Balance Overall balance assessment: Needs assistance Sitting-balance support: Feet supported;No upper extremity supported Sitting balance-Leahy Scale: Poor Sitting balance - Comments: pt initially requiring maxA to maintain EOB balance, pt with strong posterior bias. Pt with periodic episodes of sitting with close min guard for about 30 sec Postural control: Posterior lean Standing balance support: Bilateral upper extremity supported Standing balance-Leahy Scale: Zero Standing balance comment: dependent on physical assist                             Pertinent Vitals/Pain Pain Assessment: Faces Faces Pain Scale: No hurt    Home Living Family/patient expects to be discharged to:: Private residence Living Arrangements:  (grandson) Available Help at Discharge: Family;Available 24 hours/day Type of Home: House           Additional Comments: pt poor historian, info provided by chart    Prior Function Level of Independence: Independent with assistive device(s)         Comments: per chart, was walking with RW, was able dress and bath self, per chart per daughter states her memory has started declining and she has started managing pt's finances     Hand Dominance        Extremity/Trunk Assessment   Upper Extremity Assessment Upper Extremity Assessment: Defer to OT evaluation    Lower Extremity Assessment Lower Extremity  Assessment: RLE deficits/detail;LLE deficits/detail RLE Deficits / Details: hold R LE in hip flexion and external rotation with foot next to L knee (figure 4 position) pt appears to have incresaed flexor tone, does move it actively however not as much as L LLE Deficits / Details: delayed processing and squencing    Cervical / Trunk Assessment Cervical / Trunk Assessment:  Kyphotic  Communication   Communication: Expressive difficulties  Cognition Arousal/Alertness: Awake/alert Behavior During Therapy: Flat affect Overall Cognitive Status: Impaired/Different from baseline (assessment also limited by language deficits) Area of Impairment: Orientation;Attention;Memory;Following commands;Safety/judgement;Awareness;Problem solving                 Orientation Level: Disoriented to;Place;Time;Situation Current Attention Level: Focused Memory: Decreased short-term memory Following Commands: Follows one step commands with increased time;Follows one step commands inconsistently (simple commands about 75% of time) Safety/Judgement: Decreased awareness of deficits Awareness: Intellectual Problem Solving: Decreased initiation;Difficulty sequencing;Slow processing;Requires verbal cues;Requires tactile cues General Comments: pt with severe word finding difficulty, despite incresaed tone in R UE and LE pt more apt to follow commands with R UE versus L UE. With tactile cues pt will follow commands with L UE      General Comments General comments (skin integrity, edema, etc.): VSS, noted impaired vision    Exercises     Assessment/Plan    PT Assessment Patient needs continued PT services  PT Problem List Decreased strength;Decreased range of motion;Decreased activity tolerance;Decreased balance;Decreased mobility;Decreased coordination;Decreased cognition;Decreased knowledge of use of DME;Decreased safety awareness       PT Treatment Interventions DME instruction;Gait training;Stair training;Functional mobility training;Therapeutic activities;Therapeutic exercise;Balance training;Neuromuscular re-education    PT Goals (Current goals can be found in the Care Plan section)  Acute Rehab PT Goals PT Goal Formulation: Patient unable to participate in goal setting Time For Goal Achievement: 08/14/20 Potential to Achieve Goals: Fair    Frequency Min 4X/week    Barriers to discharge        Co-evaluation               AM-PAC PT "6 Clicks" Mobility  Outcome Measure Help needed turning from your back to your side while in a flat bed without using bedrails?: A Lot Help needed moving from lying on your back to sitting on the side of a flat bed without using bedrails?: A Lot Help needed moving to and from a bed to a chair (including a wheelchair)?: A Lot Help needed standing up from a chair using your arms (e.g., wheelchair or bedside chair)?: Total Help needed to walk in hospital room?: Total Help needed climbing 3-5 steps with a railing? : Total 6 Click Score: 9    End of Session Equipment Utilized During Treatment: Gait belt Activity Tolerance: Patient tolerated treatment well Patient left: in chair;with call bell/phone within reach;with chair alarm set Nurse Communication: Mobility status PT Visit Diagnosis: Unsteadiness on feet (R26.81);Difficulty in walking, not elsewhere classified (R26.2)    Time: 3614-4315 PT Time Calculation (min) (ACUTE ONLY): 33 min   Charges:   PT Evaluation $PT Eval Moderate Complexity: 1 Mod PT Treatments $Therapeutic Activity: 8-22 mins        Kittie Plater, PT, DPT Acute Rehabilitation Services Pager #: [phone] Office #: [phone]   [place] Lopes 07/24/2020, 10:47 AM

## 2020-07-24 NOTE — Progress Notes (Addendum)
Pt DC yesterday from LTM. New leads used to re hook for spot EEG.  LTM EEG hooked up and running - no initial skin breakdown - push button tested - neuro notified. Same leads used FROM spot

## 2020-07-25 DIAGNOSIS — R1312 Dysphagia, oropharyngeal phase: Secondary | ICD-10-CM

## 2020-07-25 DIAGNOSIS — N390 Urinary tract infection, site not specified: Secondary | ICD-10-CM

## 2020-07-25 LAB — BASIC METABOLIC PANEL
Anion gap: 11 (ref 5–15)
BUN: 23 mg/dL (ref 8–23)
CO2: 21 mmol/L — ABNORMAL LOW (ref 22–32)
Calcium: 9.9 mg/dL (ref 8.9–10.3)
Chloride: 113 mmol/L — ABNORMAL HIGH (ref 98–111)
Creatinine, Ser: 1.24 mg/dL — ABNORMAL HIGH (ref 0.44–1.00)
GFR calc Af Amer: 45 mL/min — ABNORMAL LOW (ref >60)
GFR calc non Af Amer: 39 mL/min — ABNORMAL LOW (ref >60)
Glucose, Bld: 80 mg/dL (ref 70–99)
Potassium: 3.1 mmol/L — ABNORMAL LOW (ref 3.5–5.1)
Sodium: 145 mmol/L (ref 135–145)

## 2020-07-25 LAB — MAGNESIUM
Magnesium: 1.9 mg/dL (ref 1.7–2.4)
Magnesium: 2 mg/dL (ref 1.7–2.4)

## 2020-07-25 LAB — CBC
HCT: 41.8 % (ref 36.0–46.0)
Hemoglobin: 13.2 g/dL (ref 12.0–15.0)
MCH: 29.1 pg (ref 26.0–34.0)
MCHC: 31.6 g/dL (ref 30.0–36.0)
MCV: 92.1 fL (ref 80.0–100.0)
Platelets: 122 10*3/uL — ABNORMAL LOW (ref 150–400)
RBC: 4.54 MIL/uL (ref 3.87–5.11)
RDW: 18.2 % — ABNORMAL HIGH (ref 11.5–15.5)
WBC: 5.5 10*3/uL (ref 4.0–10.5)
nRBC: 0 % (ref 0.0–0.2)

## 2020-07-25 LAB — PHOSPHORUS
Phosphorus: 3.5 mg/dL (ref 2.5–4.6)
Phosphorus: 3.3 mg/dL (ref 2.5–4.6)

## 2020-07-25 LAB — GLUCOSE, CAPILLARY
Glucose-Capillary: 75 mg/dL (ref 70–99)
Glucose-Capillary: 72 mg/dL (ref 70–99)
Glucose-Capillary: 89 mg/dL (ref 70–99)
Glucose-Capillary: 123 mg/dL — ABNORMAL HIGH (ref 70–99)

## 2020-07-25 MED ORDER — POTASSIUM CHLORIDE 20 MEQ/15ML (10%) PO SOLN
40.0000 meq | ORAL | Status: DC
  Filled 2020-07-25: qty 30

## 2020-07-25 MED ORDER — LABETALOL HCL 5 MG/ML IV SOLN: 5.0000 mg | INTRAVENOUS | Status: DC | PRN

## 2020-07-25 MED ORDER — PANTOPRAZOLE SODIUM 40 MG PO PACK
40.0000 mg | PACK | Freq: Every day | ORAL | Status: DC
  Administered 2020-07-25 – 2020-07-26 (×2): 40 mg
  Filled 2020-07-25 (×2): qty 20

## 2020-07-25 MED ORDER — LEVETIRACETAM 500 MG PO TABS
1000.0000 mg | ORAL_TABLET | Freq: Two times a day (BID) | ORAL | Status: DC
  Filled 2020-07-25: qty 2

## 2020-07-25 MED ORDER — PROSOURCE TF PO LIQD
45.0000 mL | Freq: Two times a day (BID) | ORAL | Status: DC
  Administered 2020-07-25 – 2020-07-26 (×3): 45 mL
  Filled 2020-07-25 (×4): qty 45

## 2020-07-25 MED ORDER — LEVOTHYROXINE SODIUM 50 MCG PO TABS
50.0000 ug | ORAL_TABLET | Freq: Every day | ORAL | Status: DC
  Administered 2020-07-26: 50 ug
  Filled 2020-07-25: qty 1

## 2020-07-25 MED ORDER — METOPROLOL TARTRATE 50 MG PO TABS
50.0000 mg | ORAL_TABLET | Freq: Two times a day (BID) | ORAL | Status: DC
  Administered 2020-07-25 – 2020-07-26 (×3): 50 mg
  Filled 2020-07-25 (×3): qty 1

## 2020-07-25 MED ORDER — LEVETIRACETAM 100 MG/ML PO SOLN
1000.0000 mg | Freq: Two times a day (BID) | ORAL | Status: DC
  Administered 2020-07-25 – 2020-07-26 (×2): 1000 mg
  Filled 2020-07-25 (×3): qty 10

## 2020-07-25 MED ORDER — SENNOSIDES-DOCUSATE SODIUM 8.6-50 MG PO TABS
1.0000 | ORAL_TABLET | Freq: Two times a day (BID) | ORAL | Status: DC
  Administered 2020-07-25: 1
  Filled 2020-07-25 (×2): qty 1

## 2020-07-25 MED ORDER — HEPARIN SODIUM (PORCINE) 5000 UNIT/ML IJ SOLN
5000.0000 [IU] | Freq: Two times a day (BID) | INTRAMUSCULAR | Status: DC
  Administered 2020-07-25 – 2020-07-26 (×3): 5000 [IU] via SUBCUTANEOUS
  Filled 2020-07-25 (×3): qty 1

## 2020-07-25 MED ORDER — POTASSIUM CHLORIDE 20 MEQ/15ML (10%) PO SOLN
40.0000 meq | ORAL | Status: AC
  Administered 2020-07-25: 40 meq

## 2020-07-25 MED ORDER — OSMOLITE 1.2 CAL PO LIQD
1000.0000 mL | ORAL | Status: DC
  Administered 2020-07-25 – 2020-07-26 (×2): 1000 mL
  Filled 2020-07-25 (×2): qty 1000

## 2020-07-25 MED ORDER — POTASSIUM CHLORIDE 20 MEQ/15ML (10%) PO SOLN: 20.0000 meq | ORAL | Status: DC

## 2020-07-25 MED ORDER — VITAL HIGH PROTEIN PO LIQD: 1000.0000 mL | ORAL | Status: DC

## 2020-07-25 MED ORDER — SODIUM CHLORIDE 0.9 % IV SOLN: INTRAVENOUS | Status: DC

## 2020-07-25 MED ORDER — LORAZEPAM 0.5 MG PO TABS
0.5000 mg | ORAL_TABLET | Freq: Every day | ORAL | Status: DC
  Administered 2020-07-25: 0.5 mg
  Filled 2020-07-25: qty 1

## 2020-07-25 MED ORDER — LORAZEPAM 0.5 MG PO TABS: 0.5000 mg | ORAL_TABLET | Freq: Every day | ORAL | Status: DC

## 2020-07-25 MED ORDER — LEVETIRACETAM 500 MG PO TABS: 1000.0000 mg | ORAL_TABLET | Freq: Two times a day (BID) | ORAL | Status: DC

## 2020-07-25 MED ORDER — POTASSIUM CHLORIDE 20 MEQ/15ML (10%) PO SOLN: 40.0000 meq | ORAL | Status: DC

## 2020-07-25 NOTE — Progress Notes (Signed)
Initial Nutrition Assessment  DOCUMENTATION CODES:   Not applicable  INTERVENTION:   Initiate tube feeding via Cortrak (gastric): Osmolite 1.5 at 65 ml/h (1560 ml per day) Prosource TF 45 ml TID  Provides 1664 kcal, 95 gm protein, 1070 ml free water daily   NUTRITION DIAGNOSIS:   Inadequate oral intake related to inability to eat as evidenced by NPO status.  GOAL:   Patient will meet greater than or equal to 90% of their needs  MONITOR:   TF tolerance, Diet advancement  REASON FOR ASSESSMENT:   Consult Enteral/tube feeding initiation and management  ASSESSMENT:   Pt with PMH of Afib on coumadin, HTN, HLD, hypothyroidism, and CKD stage III admitted with small L ICH and L ICH.    Per SLP notes pt was eating soft foods (sound like dysphagia 2) at home and had coughing episodes with eating PTA. Per SLP not ready for POs and would need MBS prior to starting a diet.   Spoke with 2 daughters at bedside who report pt had lost some weight at the beginning of the year and her 7 children started taking turns bringing her breakfast and dinner everyday. She was able to regain weight and was eating well. The family does report that it was difficult staying away from high vitamin K foods and salt PTA. Dispelled myths about these diets and will work with pt and family when pt is able to eat again.   Pt discussed during ICU rounds and with RN.   Medications reviewed and include: senokot  Labs reviewed: K+ 3.1 (L)    NUTRITION - FOCUSED PHYSICAL EXAM:    Most Recent Value  Orbital Region No depletion  Upper Arm Region No depletion  Thoracic and Lumbar Region No depletion  Buccal Region Mild depletion  Temple Region Mild depletion  Clavicle Bone Region No depletion  Clavicle and Acromion Bone Region No depletion  Scapular Bone Region No depletion  Dorsal Hand No depletion  Patellar Region No depletion  Anterior Thigh Region No depletion  Posterior Calf Region No depletion   Edema (RD Assessment) Mild  Hair Reviewed  Eyes Reviewed  Mouth Reviewed  Skin Reviewed  Nails Reviewed       Diet Order:   Diet Order            Diet NPO time specified  Diet effective now                 EDUCATION NEEDS:   No education needs have been identified at this time  Skin:  Skin Assessment: Reviewed RN Assessment  Last BM:  unknown  Height:   Ht Readings from Last 1 Encounters:  07/04/20 5\' 6"  (1.676 m)    Weight:   Wt Readings from Last 1 Encounters:  07/22/20 69.4 kg    Ideal Body Weight:  59 kg  BMI:  Body mass index is 24.69 kg/m.  Estimated Nutritional Needs:   Kcal:  1500-1700  Protein:  85-105 grams  Fluid:  > 1.5 L/day  Lockie Pares., RD, LDN, CNSC See AMiON for contact information

## 2020-07-25 NOTE — Progress Notes (Signed)
STROKE TEAM PROGRESS NOTE   INTERVAL HISTORY Pt daughter at the bedside.  Patient EEG overnight no seizure, will DC long-term EEG.  Patient eyes open, more awake alert than yesterday, able to tell her name and place, but not orientated to time, age or people.  Significant perseveration today.  Still has mild right lower extremity mild clonus, which daughter stated that was chronic.  Vitals:   07/25/20 0800 07/25/20 0900 07/25/20 1000 07/25/20 1100  BP: (!) 142/93 (!) 136/93 (!) 138/92 (!) 137/102  Pulse: (!) 37 102 100 (!) 126  Resp: 12 14 12 20   Temp: 97.9 F (36.6 C)     TempSrc: Axillary     SpO2: 96% 99% 99% 96%  Weight:       CBC:  Recent Labs  Lab 07/22/20 1535 07/23/20 0200 07/24/20 0407 07/25/20 0338  WBC 5.2   < > 3.9* 5.5  NEUTROABS 3.8  --   --   --   HGB 14.2   < > 13.8 13.2  HCT 45.0   < > 43.2 41.8  MCV 92.4   < > 92.5 92.1  PLT 162   < > 148* 122*   < > = values in this interval not displayed.   Basic Metabolic Panel:  Recent Labs  Lab 07/24/20 0407 07/25/20 0338  NA 143 145  K 3.4* 3.1*  CL 111 113*  CO2 21* 21*  GLUCOSE 111* 80  BUN 20 23  CREATININE 1.28* 1.24*  CALCIUM 9.8 9.9   Lipid Panel:  Recent Labs  Lab 07/23/20 0200  CHOL 205*  TRIG 52  HDL 55  CHOLHDL 3.7  VLDL 10  LDLCALC 140*   HgbA1c:  Recent Labs  Lab 07/23/20 0243  HGBA1C 5.7*   Urine Drug Screen: No results for input(s): LABOPIA, COCAINSCRNUR, LABBENZ, AMPHETMU, THCU, LABBARB in the last 168 hours.  Alcohol Level  Recent Labs  Lab 07/22/20 [place] <10    IMAGING past 24 hours EEG adult  Result Date: 07/24/2020 Lora Havens, MD     07/25/2020  9:49 AM Patient Name:Lori Bridges JHE:[id number] Epilepsy Attending:Priyanka [name] Referring Physician/Provider:Dr Rosalin Hawking Date:07/24/2020 Duration: 23.40 mins  Patient history:86 y/o F on Warfarin p/w L frontomedial ICH and another smaller L temporooccipital ICH with associated vasogenic edema.On exam  noted to haveR leg and arm twitching, Unable to abduct R eye and adduct Left eye. EEG to evaluate for seizure  Level of alertness:lethargic/ asleep  AEDs during EEG study:Keppra, ativan  Technical aspects: This EEG study was done with scalp electrodes positioned according to the 10-20 International system of electrode placement. Electrical activity was acquired at a sampling rate of 500Hz  and reviewed with a high frequency filter of 70Hz  and a low frequency filter of 1Hz . EEG data were recorded continuously and digitally stored.  Description:Noposterior dominant rhythm was seen.  Sleep was characterized by vertex waves, sleep spindles (12 to 14 Hz), maximal frontocentral region.  EEG showed continuous generalized and maximal left parieto-occipital region 3 to 6 Hz theta-delta slowing. Single sharp transient was seen in left posterior temporal region.  Hyperventilation and photic stimulation were not performed.  ABNORMALITY -Continuousslow, generalized and maximal left parieto-occipital region  IMPRESSION: This study is suggestive of cortical dysfunction in left right occipital region likely secondary to underlying hemorrhage as well as moderate diffuse encephalopathy, nonspecific etiology but likely related to sedation,post-ictal state.No seizures ordefiniteepileptiform discharges were seen throughout the recording.   [place]  EXAM  Temp:  [97.2 F (36.2 C)-97.9 F (36.6 C)] 97.9 F (36.6 C) (07/28 0800) Pulse Rate:  [25-134] 126 (07/28 1100) Resp:  [12-25] 20 (07/28 1100) BP: (113-142)/(72-106) 137/102 (07/28 1100) SpO2:  [95 %-99 %] 96 % (07/28 1100)  General - Well nourished, well developed, in no apparent distress.  Ophthalmologic - fundi not visualized due to noncooperation.  Cardiovascular - irregularly irregular heart rate and rhythm  Neuro - awake, eyes open, able to say her name and place but not orientated to age, time or people, otherwise  mumbling words intangible. Follows limited midline commands, but no peripheral commands. Not blinking to visual threat bilaterally but tracking bilaterally. No gaze palsy, facial symmetrical, tongue midline. LUE at least 4/5 and LLE at least 3/5, RUE drift but able to against gravity more than 10 seconds. RLE 2+/5 withdraw to pain. Sensation subjectively symmetrical. FTN not cooperative and gait not tested.   ASSESSMENT/PLAN Lori Bridges is a 86 y.o. female with history of atrial fibrillation on coumadin, hypertension, hyperlipidemia, hypothyroidism and chronic kidney disease presenting with speech difficulty, confusion.   Stroke:  Small L frontomedial ICH and L temporooccipital ICH in pt w/ chronic AF on warfarin (subtherapeutic), likely hypertensive   CT head L vertex ICH w/ vasogenic edema. Extra axial extenion along falx and interhemispheric fissure, L temporal lobe hemorrhage. Concern for mets.  Warfarin reversal w/ Kcentra, Vit K  Treated w/ decadron d/t possible mets  MRI w/w/o  Unchanged L parietal and L temporal lobe hemorrhage w/ mild edema. No mass. No clear CAA  CTA head no AVM, stable hematoma  Carotid doppler unremarkable  Recent 2D Echo EF 60-65%. AF. LA severely dilated.  LDL 140  HgbA1c 5.7  VTE prophylaxis - SCDs   warfarin daily prior to admission, now on No antithrombotic given hemorrhage.   Therapy recommendations:  CIR  Disposition:  pending   Seizure  7/25 R leg and arm twitching in ED -> Ativan 2 x 1 -> Loaded w/ Keppra 1500 -> 500 bid  EEG  Excessive beta, generalized slowing  LT EEG 7/26 no seizure  7/27 intermittent but frequent episodes of right facial twitching, right UE tonic extension, raising up into air, followed by rhythmic jerking for 1 min or so then evolving into subtle hand and forearm jerking -> ativan 2mg  x1 ->increase keppra to 1000 bid  Stat EEG Continuousslow, generalized and maximal left parieto-occipital region  LT EEG  7/28 epileptogenicity in left frontal region, no seizure  Permanent Atrial Fibrillation  Home anticoagulation:  warfarin daily   INR on arrival 1.6 ->1.5->1.4->1.4   S/p Kcentra, Vit K  Future antithrombotic options will be discussed at outpatient follow-up  Hypertension  Home meds:  Lasix 20, hydralazine 25, irbesartan 150, metoprolol 100 am + 25 pm  BP 150s on admission   On metoprolol 50 twice daily  BP stable . SBP goal < 160 . Long-term BP goal normotensive  Hyperlipidemia  Home meds:  No statin  Intolerant to multiple statins and zetia  LDL 140  Hold statin in setting of ICH and hx intolerance  Dysphagia . Secondary to stroke . NPO . Speech on board . On IVF @ 10 . Cortrak placed, TF @ 55    UTI  UA w/ many bacteria, 6-10 WBC    Rocephin 7/27>>  UCx E.Coli  Other Stroke Risk Factors  Advanced age  Other Active Problems  CKD stage IIIa, Cre 1.22->1.28->1.24   Hypothyroid on synthroid PTA  Hypokalemia 3.4->3.1 -  Supplement - recheck am  Hospital day # 3  This patient is critically ill due to left hemisphere ICH, seizure, UTI, A. fib RVR, dysphagia and at significant risk of neurological worsening, death form hematoma expansion, status epilepticus, sepsis, heart failure, aspiration. This patient's care requires constant monitoring of vital signs, hemodynamics, respiratory and cardiac monitoring, review of multiple databases, neurological assessment, discussion with family, other specialists and medical decision making of high complexity. I spent 35 minutes of neurocritical care time in the care of this patient. I had long discussion with daughter at bedside, updated pt current condition, treatment plan and potential prognosis, and answered all the questions. She expressed understanding and appreciation.    Rosalin Hawking, MD PhD Stroke Neurology 07/25/2020 11:13 AM    To contact Stroke Continuity provider, please refer to [email or web address] After  hours, contact General Neurology

## 2020-07-25 NOTE — Progress Notes (Signed)
Pt still due to void upon arrival to the unit. Pt bladder scanned for 231ml. MD on-call notified and ordered to continue to observe pt and I&O when bladder scan is over 345ml. Will report off to oncoming RN. Francis Gaines Ranee Peasley RN.

## 2020-07-25 NOTE — Progress Notes (Signed)
Pt admitted to the unit as transfer from 4N. Telemetry applied and verified with CCMD, skin intact with no pressure ulcers or opened wounds noted. Pt oriented to the unit and room, family at bedside, call light within reach, bed alarm on, corktrak intact to nare, per pt family pt I&O cath after 1400, pt d/t void. Will continue to closely monitor. Delia Heady RN

## 2020-07-25 NOTE — Progress Notes (Signed)
LTM maint complete - no skin breakdown under:[id number]

## 2020-07-25 NOTE — Progress Notes (Signed)
LTM EEG discontinued - no skin breakdown at unhook.   

## 2020-07-25 NOTE — Progress Notes (Signed)
Physical Therapy Treatment Patient Details Name: Lori Bridges MRN: [id number] DOB: 04-08-1930 Today's Date: 07/25/2020    History of Present Illness 86 yo female admitted with L frontomedial ICH and L temporooccipital ICH, seizure and dysphagia PMH chronic afib on warfarin HTn HLD . Lived at home with grandson, amb with walker and was indep with ADLs.    PT Comments    Pt limited by fatigue and slowed processing this session. Pt with significant increased time to follow commands and is minimally verbal during session. Pt is profoundly weak at this time and demonstrates no generalized weakness in all extremities with LE weakness more significant than uppers. Pt requires totalA for all mobility at this time and is unable to initiate standing attempts despite PT tactile and verbal cues. Pt will benefit from continued acute PT services to improve activity tolerance and mobility quality. PT continues to recommend CIR at this time however if the pt does not demonstrate improved ability to consistently participate and more regularly follow commands then the pt may be more appropriate for SNF placement.   Follow Up Recommendations  CIR     Equipment Recommendations  Hospital bed;Wheelchair (measurements PT);Wheelchair cushion (measurements PT) (mechanical lift if home today)    Recommendations for Other Services       Precautions / Restrictions Precautions Precautions: Fall Precaution Comments: R increased tone, hold R UE and LE in flexion, HOH Restrictions Weight Bearing Restrictions: No    Mobility  Bed Mobility Overal bed mobility: Needs Assistance Bed Mobility: Rolling;Supine to Sit;Sit to Supine Rolling: Total assist   Supine to sit: Total assist Sit to supine: Total assist      Transfers Overall transfer level: Needs assistance Equipment used: 1 person hand held assist Transfers: Sit to/from Stand Sit to Stand: Total assist;From elevated surface (unable to clear  buttocks)            Ambulation/Gait                 Stairs             Wheelchair Mobility    Modified Rankin (Stroke Patients Only) Modified Rankin (Stroke Patients Only) Pre-Morbid Rankin Score: Slight disability Modified Rankin: Severe disability     Balance Overall balance assessment: Needs assistance Sitting-balance support: Single extremity supported Sitting balance-Leahy Scale: Poor Sitting balance - Comments: minA with RUE support of bed Postural control: Posterior lean;Right lateral lean                                  Cognition Arousal/Alertness: Lethargic;Awake/alert (initially lethargic, becomes more alert with stimulation) Behavior During Therapy: Flat affect Overall Cognitive Status: Impaired/Different from baseline Area of Impairment: Attention;Memory;Following commands;Safety/judgement;Awareness;Problem solving                   Current Attention Level: Focused Memory: Decreased recall of precautions;Decreased short-term memory Following Commands: Follows one step commands with increased time Safety/Judgement: Decreased awareness of safety;Decreased awareness of deficits Awareness: Intellectual Problem Solving: Decreased initiation;Difficulty sequencing;Slow processing;Requires verbal cues;Requires tactile cues        Exercises      General Comments General comments (skin integrity, edema, etc.): pt noted to be in A-fib on monitor, HR ranging from low 100s-125 during session, BP stable. Pt is minimally verbal during session. Pt with increasd flexor tone in RLE and preference for R lateral lean throughout session      Pertinent Vitals/Pain Pain  Assessment: Faces Faces Pain Scale: No hurt    Home Living                      Prior Function            PT Goals (current goals can now be found in the care plan section) Acute Rehab PT Goals Patient Stated Goal: pt unable to state Progress towards PT  goals: Not progressing toward goals - comment (poor initiation, limited activity tolerance)    Frequency    Min 4X/week      PT Plan Current plan remains appropriate    Co-evaluation              AM-PAC PT "6 Clicks" Mobility   Outcome Measure  Help needed turning from your back to your side while in a flat bed without using bedrails?: Total Help needed moving from lying on your back to sitting on the side of a flat bed without using bedrails?: Total Help needed moving to and from a bed to a chair (including a wheelchair)?: Total Help needed standing up from a chair using your arms (e.g., wheelchair or bedside chair)?: Total Help needed to walk in hospital room?: Total Help needed climbing 3-5 steps with a railing? : Total 6 Click Score: 6    End of Session Equipment Utilized During Treatment: Oxygen Activity Tolerance: Patient tolerated treatment well Patient left: in bed;with call bell/phone within reach;with bed alarm set Nurse Communication: Mobility status;Need for lift equipment PT Visit Diagnosis: Unsteadiness on feet (R26.81);Difficulty in walking, not elsewhere classified (R26.2)     Time: 0156-1537 PT Time Calculation (min) (ACUTE ONLY): 23 min  Charges:  $Therapeutic Activity: 23-37 mins                     Zenaida Niece, PT, DPT Acute Rehabilitation Pager: [phone]    Zenaida Niece 07/25/2020, 3:05 PM

## 2020-07-25 NOTE — Procedures (Signed)
Patient Name:Lori Bridges AST:[id number] Epilepsy Attending:Geanine Vandekamp [name] Referring Physician/Provider:Dr Rosalin Hawking Duration: 07/24/2020 1119 to 07/25/2020 1124  Patient history:86 y/o F on Warfarin p/w L frontomedial ICH and another smaller L temporooccipital ICH with associated vasogenic edema.has intermittent but frequent episodes of right facial twitching, right upper extremity tonic extension for by rhythmic jerking.  EEG to evaluate for seizure  Level of alertness:lethargic/ asleep  AEDs during EEG study:Keppra, ativan  Technical aspects: This EEG study was done with scalp electrodes positioned according to the 10-20 International system of electrode placement. Electrical activity was acquired at a sampling rate of 500Hz  and reviewed with a high frequency filter of 70Hz  and a low frequency filter of 1Hz . EEG data were recorded continuously and digitally stored.   Description:Noposterior dominant rhythm was seen.Sleep was characterized by vertex waves, sleep spindles (12 to 14 Hz), maximal frontocentral region. EEG showed continuous generalized and maximal left parieto-occipital region 3 to 6 Hz theta-delta slowing.  When patient was awake or stimulated, EEG showed rhythmic 2 to 5 Hz theta-delta slowing in left hemisphere, maximal left frontocentral region without any evolution, Sharp waves were seen in left frontal region.  Hyperventilation and photic stimulation were not performed.   ABNORMALITY - Sharp waves, left frontal region -Continuousslow, generalized and maximal left parieto-occipital region  IMPRESSION: This study is suggestive of epileptogenicity in left frontal region as well as cortical dysfunction in left parieto- occipital region likely secondary to underlying hemorrhage. Additionally, there is evidence of moderate diffuse encephalopathy, nonspecific etiology but likely related to sedation,post-ictal state.No seizures ordefiniteepileptiform  discharges were seen throughout the recording.  Camar Guyton [name]

## 2020-07-25 NOTE — Progress Notes (Signed)
Inpatient Rehab Admissions:  Inpatient Rehab Consult received.  I met with patient at the bedside for rehabilitation assessment and to discuss goals and expectations of an inpatient rehab admission.  She does not interact, only moans.  Note therapy documentation today may consider updating recs to SNF if participation does not increase.  Will follow for another day or so to see if pt improves.    Signed: Shann Medal, PT, DPT Admissions Coordinator [phone] 07/25/20  3:10 PM

## 2020-07-25 NOTE — Procedures (Signed)
Cortrak  Person Inserting Tube:  Esaw Dace, RD Tube Type:  Cortrak - 43 inches Tube Location:  Left nare Initial Placement:  Stomach Secured by: Bridle Technique Used to Measure Tube Placement:  Documented cm marking at nare/ corner of mouth Cortrak Secured At:  66 cm Procedure Comments:  Cortrak Tube Team Note:  Consult received to place a Cortrak feeding tube.   No x-ray is required. RN may begin using tube.   If the tube becomes dislodged please keep the tube and contact the Cortrak team at [email or web address] (password [email or web address]) for replacement.  If after hours and replacement cannot be delayed, place a NG tube and confirm placement with an abdominal x-ray.   Kerman Passey MS, RDN, LDN, CNSC Registered Dietitian III Clinical Nutrition RD Pager and On-Call Pager Number Located in [place]

## 2020-07-26 ENCOUNTER — Encounter (HOSPITAL_COMMUNITY): Payer: Self-pay | Admitting: Physical Medicine & Rehabilitation

## 2020-07-26 ENCOUNTER — Inpatient Hospital Stay (HOSPITAL_COMMUNITY)
Admission: RE | Admit: 2020-07-26 | Discharge: 2020-08-16 | DRG: 056 | Disposition: A | Discharge status: Home Health | Payer: Medicare Other | Source: Intra-hospital | Attending: Physical Medicine & Rehabilitation | Admitting: Physical Medicine & Rehabilitation

## 2020-07-26 ENCOUNTER — Inpatient Hospital Stay (HOSPITAL_COMMUNITY): Payer: Medicare Other

## 2020-07-26 DIAGNOSIS — I071 Rheumatic tricuspid insufficiency: Secondary | ICD-10-CM | POA: Diagnosis present

## 2020-07-26 DIAGNOSIS — W19XXXA Unspecified fall, initial encounter: Secondary | ICD-10-CM | POA: Diagnosis not present

## 2020-07-26 DIAGNOSIS — G479 Sleep disorder, unspecified: Secondary | ICD-10-CM

## 2020-07-26 DIAGNOSIS — N183 Chronic kidney disease, stage 3 unspecified: Secondary | ICD-10-CM | POA: Diagnosis present

## 2020-07-26 DIAGNOSIS — R159 Full incontinence of feces: Secondary | ICD-10-CM | POA: Diagnosis present

## 2020-07-26 DIAGNOSIS — R1310 Dysphagia, unspecified: Secondary | ICD-10-CM | POA: Diagnosis present

## 2020-07-26 DIAGNOSIS — I69154 Hemiplegia and hemiparesis following nontraumatic intracerebral hemorrhage affecting left non-dominant side: Secondary | ICD-10-CM | POA: Diagnosis not present

## 2020-07-26 DIAGNOSIS — M1990 Unspecified osteoarthritis, unspecified site: Secondary | ICD-10-CM | POA: Diagnosis present

## 2020-07-26 DIAGNOSIS — Z885 Allergy status to narcotic agent status: Secondary | ICD-10-CM

## 2020-07-26 DIAGNOSIS — I69391 Dysphagia following cerebral infarction: Secondary | ICD-10-CM

## 2020-07-26 DIAGNOSIS — E871 Hypo-osmolality and hyponatremia: Secondary | ICD-10-CM | POA: Diagnosis not present

## 2020-07-26 DIAGNOSIS — Z833 Family history of diabetes mellitus: Secondary | ICD-10-CM | POA: Diagnosis not present

## 2020-07-26 DIAGNOSIS — D72819 Decreased white blood cell count, unspecified: Secondary | ICD-10-CM

## 2020-07-26 DIAGNOSIS — D696 Thrombocytopenia, unspecified: Secondary | ICD-10-CM

## 2020-07-26 DIAGNOSIS — Z882 Allergy status to sulfonamides status: Secondary | ICD-10-CM

## 2020-07-26 DIAGNOSIS — Y92239 Unspecified place in hospital as the place of occurrence of the external cause: Secondary | ICD-10-CM | POA: Diagnosis not present

## 2020-07-26 DIAGNOSIS — I611 Nontraumatic intracerebral hemorrhage in hemisphere, cortical: Secondary | ICD-10-CM | POA: Diagnosis not present

## 2020-07-26 DIAGNOSIS — I69191 Dysphagia following nontraumatic intracerebral hemorrhage: Secondary | ICD-10-CM | POA: Diagnosis present

## 2020-07-26 DIAGNOSIS — M810 Age-related osteoporosis without current pathological fracture: Secondary | ICD-10-CM | POA: Diagnosis present

## 2020-07-26 DIAGNOSIS — I10 Essential (primary) hypertension: Secondary | ICD-10-CM | POA: Diagnosis not present

## 2020-07-26 DIAGNOSIS — F419 Anxiety disorder, unspecified: Secondary | ICD-10-CM | POA: Diagnosis present

## 2020-07-26 DIAGNOSIS — Z515 Encounter for palliative care: Secondary | ICD-10-CM

## 2020-07-26 DIAGNOSIS — Z800 Family history of malignant neoplasm of digestive organs: Secondary | ICD-10-CM | POA: Diagnosis not present

## 2020-07-26 DIAGNOSIS — Z298 Encounter for other specified prophylactic measures: Secondary | ICD-10-CM | POA: Diagnosis not present

## 2020-07-26 DIAGNOSIS — Z8249 Family history of ischemic heart disease and other diseases of the circulatory system: Secondary | ICD-10-CM | POA: Diagnosis not present

## 2020-07-26 DIAGNOSIS — Z7901 Long term (current) use of anticoagulants: Secondary | ICD-10-CM

## 2020-07-26 DIAGNOSIS — R32 Unspecified urinary incontinence: Secondary | ICD-10-CM | POA: Diagnosis present

## 2020-07-26 DIAGNOSIS — B9620 Unspecified Escherichia coli [E. coli] as the cause of diseases classified elsewhere: Secondary | ICD-10-CM | POA: Diagnosis present

## 2020-07-26 DIAGNOSIS — E039 Hypothyroidism, unspecified: Secondary | ICD-10-CM | POA: Diagnosis present

## 2020-07-26 DIAGNOSIS — Z888 Allergy status to other drugs, medicaments and biological substances status: Secondary | ICD-10-CM | POA: Diagnosis not present

## 2020-07-26 DIAGNOSIS — Z781 Physical restraint status: Secondary | ICD-10-CM

## 2020-07-26 DIAGNOSIS — E875 Hyperkalemia: Secondary | ICD-10-CM

## 2020-07-26 DIAGNOSIS — G936 Cerebral edema: Secondary | ICD-10-CM | POA: Diagnosis present

## 2020-07-26 DIAGNOSIS — K5900 Constipation, unspecified: Secondary | ICD-10-CM | POA: Diagnosis present

## 2020-07-26 DIAGNOSIS — I69120 Aphasia following nontraumatic intracerebral hemorrhage: Secondary | ICD-10-CM | POA: Diagnosis not present

## 2020-07-26 DIAGNOSIS — N1831 Chronic kidney disease, stage 3a: Secondary | ICD-10-CM | POA: Diagnosis present

## 2020-07-26 DIAGNOSIS — K219 Gastro-esophageal reflux disease without esophagitis: Secondary | ICD-10-CM

## 2020-07-26 DIAGNOSIS — I129 Hypertensive chronic kidney disease with stage 1 through stage 4 chronic kidney disease, or unspecified chronic kidney disease: Secondary | ICD-10-CM | POA: Diagnosis present

## 2020-07-26 DIAGNOSIS — N390 Urinary tract infection, site not specified: Secondary | ICD-10-CM | POA: Diagnosis present

## 2020-07-26 DIAGNOSIS — Z79899 Other long term (current) drug therapy: Secondary | ICD-10-CM | POA: Diagnosis not present

## 2020-07-26 DIAGNOSIS — I610 Nontraumatic intracerebral hemorrhage in hemisphere, subcortical: Secondary | ICD-10-CM | POA: Diagnosis not present

## 2020-07-26 DIAGNOSIS — E870 Hyperosmolality and hypernatremia: Secondary | ICD-10-CM

## 2020-07-26 DIAGNOSIS — N3000 Acute cystitis without hematuria: Secondary | ICD-10-CM

## 2020-07-26 DIAGNOSIS — Z0189 Encounter for other specified special examinations: Secondary | ICD-10-CM

## 2020-07-26 DIAGNOSIS — I619 Nontraumatic intracerebral hemorrhage, unspecified: Secondary | ICD-10-CM | POA: Diagnosis present

## 2020-07-26 DIAGNOSIS — Z86010 Personal history of colonic polyps: Secondary | ICD-10-CM

## 2020-07-26 DIAGNOSIS — I613 Nontraumatic intracerebral hemorrhage in brain stem: Secondary | ICD-10-CM

## 2020-07-26 DIAGNOSIS — I272 Pulmonary hypertension, unspecified: Secondary | ICD-10-CM | POA: Diagnosis present

## 2020-07-26 DIAGNOSIS — R609 Edema, unspecified: Secondary | ICD-10-CM

## 2020-07-26 DIAGNOSIS — I4821 Permanent atrial fibrillation: Secondary | ICD-10-CM

## 2020-07-26 DIAGNOSIS — I480 Paroxysmal atrial fibrillation: Secondary | ICD-10-CM | POA: Diagnosis not present

## 2020-07-26 DIAGNOSIS — Z4659 Encounter for fitting and adjustment of other gastrointestinal appliance and device: Secondary | ICD-10-CM

## 2020-07-26 DIAGNOSIS — E785 Hyperlipidemia, unspecified: Secondary | ICD-10-CM | POA: Diagnosis present

## 2020-07-26 DIAGNOSIS — R569 Unspecified convulsions: Secondary | ICD-10-CM

## 2020-07-26 DIAGNOSIS — I4891 Unspecified atrial fibrillation: Secondary | ICD-10-CM | POA: Diagnosis not present

## 2020-07-26 DIAGNOSIS — R600 Localized edema: Secondary | ICD-10-CM

## 2020-07-26 LAB — BASIC METABOLIC PANEL
Anion gap: 10 (ref 5–15)
BUN: 28 mg/dL — ABNORMAL HIGH (ref 8–23)
CO2: 20 mmol/L — ABNORMAL LOW (ref 22–32)
Calcium: 10.1 mg/dL (ref 8.9–10.3)
Chloride: 117 mmol/L — ABNORMAL HIGH (ref 98–111)
Creatinine, Ser: 1.32 mg/dL — ABNORMAL HIGH (ref 0.44–1.00)
GFR calc Af Amer: 42 mL/min — ABNORMAL LOW (ref >60)
GFR calc non Af Amer: 36 mL/min — ABNORMAL LOW (ref >60)
Glucose, Bld: 136 mg/dL — ABNORMAL HIGH (ref 70–99)
Potassium: 3.7 mmol/L (ref 3.5–5.1)
Sodium: 147 mmol/L — ABNORMAL HIGH (ref 135–145)

## 2020-07-26 LAB — CBC
HCT: 42.9 % (ref 36.0–46.0)
Hemoglobin: 13.5 g/dL (ref 12.0–15.0)
MCH: 29 pg (ref 26.0–34.0)
MCHC: 31.5 g/dL (ref 30.0–36.0)
MCV: 92.1 fL (ref 80.0–100.0)
Platelets: 127 10*3/uL — ABNORMAL LOW (ref 150–400)
RBC: 4.66 MIL/uL (ref 3.87–5.11)
RDW: 18.5 % — ABNORMAL HIGH (ref 11.5–15.5)
WBC: 4.5 10*3/uL (ref 4.0–10.5)
nRBC: 0.4 % — ABNORMAL HIGH (ref 0.0–0.2)

## 2020-07-26 LAB — GLUCOSE, CAPILLARY
Glucose-Capillary: 110 mg/dL — ABNORMAL HIGH (ref 70–99)
Glucose-Capillary: 120 mg/dL — ABNORMAL HIGH (ref 70–99)
Glucose-Capillary: 124 mg/dL — ABNORMAL HIGH (ref 70–99)
Glucose-Capillary: 131 mg/dL — ABNORMAL HIGH (ref 70–99)
Glucose-Capillary: 132 mg/dL — ABNORMAL HIGH (ref 70–99)

## 2020-07-26 LAB — URINE CULTURE: Culture: >=100000 — AB

## 2020-07-26 LAB — MAGNESIUM: Magnesium: 1.9 mg/dL (ref 1.7–2.4)

## 2020-07-26 LAB — PHOSPHORUS: Phosphorus: 2.8 mg/dL (ref 2.5–4.6)

## 2020-07-26 MED ORDER — FREE WATER
150.0000 mL | Status: DC
  Administered 2020-07-26 (×2): 150 mL

## 2020-07-26 MED ORDER — HEPARIN SODIUM (PORCINE) 5000 UNIT/ML IJ SOLN: 5000.0000 [IU] | Freq: Three times a day (TID) | INTRAMUSCULAR | Status: DC

## 2020-07-26 MED ORDER — LEVOTHYROXINE SODIUM 50 MCG PO TABS: 50.0000 ug | ORAL_TABLET | Freq: Every day | ORAL | Status: DC

## 2020-07-26 MED ORDER — LEVETIRACETAM 100 MG/ML PO SOLN
1000.0000 mg | Freq: Two times a day (BID) | ORAL | Status: DC
  Administered 2020-07-26 – 2020-08-07 (×24): 1000 mg
  Filled 2020-07-26 (×26): qty 10

## 2020-07-26 MED ORDER — SENNOSIDES-DOCUSATE SODIUM 8.6-50 MG PO TABS: 1.0000 | ORAL_TABLET | Freq: Two times a day (BID) | ORAL | Status: DC

## 2020-07-26 MED ORDER — ACETAMINOPHEN 160 MG/5ML PO SOLN
650.0000 mg | ORAL | Status: DC | PRN
  Filled 2020-07-26: qty 20.3

## 2020-07-26 MED ORDER — SODIUM CHLORIDE 0.9 % IV SOLN
1.0000 g | INTRAVENOUS | Status: AC
  Administered 2020-07-27 – 2020-07-30 (×4): 1 g via INTRAVENOUS
  Filled 2020-07-26: qty 10
  Filled 2020-07-26 (×3): qty 1

## 2020-07-26 MED ORDER — LEVOTHYROXINE SODIUM 50 MCG PO TABS
50.0000 ug | ORAL_TABLET | Freq: Every day | ORAL | Status: DC
  Administered 2020-07-27 – 2020-08-16 (×20): 50 ug
  Filled 2020-07-26 (×21): qty 1

## 2020-07-26 MED ORDER — PANTOPRAZOLE SODIUM 40 MG PO PACK: 40.0000 mg | PACK | Freq: Every day | ORAL | Status: DC

## 2020-07-26 MED ORDER — ORAL CARE MOUTH RINSE: 15.0000 mL | Freq: Two times a day (BID) | OROMUCOSAL | 0 refills | Status: DC

## 2020-07-26 MED ORDER — ACETAMINOPHEN 325 MG PO TABS
650.0000 mg | ORAL_TABLET | ORAL | Status: DC | PRN
  Administered 2020-07-29 – 2020-08-05 (×3): 650 mg via ORAL
  Filled 2020-07-26 (×3): qty 2

## 2020-07-26 MED ORDER — PROSOURCE TF PO LIQD
45.0000 mL | Freq: Two times a day (BID) | ORAL | Status: DC
  Administered 2020-07-26 – 2020-07-27 (×2): 45 mL
  Filled 2020-07-26 (×2): qty 45

## 2020-07-26 MED ORDER — SENNOSIDES-DOCUSATE SODIUM 8.6-50 MG PO TABS
1.0000 | ORAL_TABLET | Freq: Two times a day (BID) | ORAL | Status: DC
  Administered 2020-07-27 – 2020-08-16 (×26): 1
  Filled 2020-07-26 (×38): qty 1

## 2020-07-26 MED ORDER — HEPARIN SODIUM (PORCINE) 5000 UNIT/ML IJ SOLN: 5000.0000 [IU] | Freq: Two times a day (BID) | INTRAMUSCULAR | Status: DC

## 2020-07-26 MED ORDER — METOPROLOL TARTRATE 25 MG PO TABS
75.0000 mg | ORAL_TABLET | Freq: Two times a day (BID) | ORAL | Status: DC
  Administered 2020-07-27 – 2020-08-16 (×40): 75 mg
  Filled 2020-07-26 (×43): qty 3

## 2020-07-26 MED ORDER — LEVETIRACETAM 100 MG/ML PO SOLN: 1000.0000 mg | Freq: Two times a day (BID) | ORAL | 12 refills | Status: DC

## 2020-07-26 MED ORDER — ACETAMINOPHEN 650 MG RE SUPP: 650.0000 mg | RECTAL | Status: DC | PRN

## 2020-07-26 MED ORDER — PANTOPRAZOLE SODIUM 40 MG PO PACK
40.0000 mg | PACK | Freq: Every day | ORAL | Status: DC
  Administered 2020-07-27 – 2020-08-15 (×20): 40 mg
  Filled 2020-07-26 (×19): qty 20

## 2020-07-26 MED ORDER — LORAZEPAM 0.5 MG PO TABS: 0.5000 mg | ORAL_TABLET | Freq: Every day | ORAL | 0 refills | Status: DC

## 2020-07-26 MED ORDER — ACETAMINOPHEN 160 MG/5ML PO SOLN: 650.0000 mg | ORAL | 0 refills | Status: DC | PRN

## 2020-07-26 MED ORDER — OSMOLITE 1.2 CAL PO LIQD: 1000.0000 mL | ORAL | 0 refills | Status: DC

## 2020-07-26 MED ORDER — METOPROLOL TARTRATE 50 MG PO TABS
75.0000 mg | ORAL_TABLET | Freq: Two times a day (BID) | ORAL | Status: DC
  Filled 2020-07-26: qty 1

## 2020-07-26 MED ORDER — OSMOLITE 1.2 CAL PO LIQD: 1000.0000 mL | ORAL | Status: DC

## 2020-07-26 MED ORDER — SODIUM CHLORIDE 0.9 % IV SOLN: 1.0000 g | INTRAVENOUS | Status: DC

## 2020-07-26 MED ORDER — PROSOURCE TF PO LIQD: 45.0000 mL | Freq: Two times a day (BID) | ORAL | Status: DC

## 2020-07-26 MED ORDER — FREE WATER: 150.0000 mL | Status: DC

## 2020-07-26 MED ORDER — METOPROLOL TARTRATE 75 MG PO TABS: 75.0000 mg | ORAL_TABLET | Freq: Two times a day (BID) | ORAL | Status: DC

## 2020-07-26 MED ORDER — METOPROLOL TARTRATE 50 MG PO TABS: 75.0000 mg | ORAL_TABLET | Freq: Two times a day (BID) | ORAL | Status: DC

## 2020-07-26 MED ORDER — HEPARIN SODIUM (PORCINE) 5000 UNIT/ML IJ SOLN
5000.0000 [IU] | Freq: Two times a day (BID) | INTRAMUSCULAR | Status: DC
  Administered 2020-07-26 – 2020-08-15 (×41): 5000 [IU] via SUBCUTANEOUS
  Filled 2020-07-26 (×41): qty 1

## 2020-07-26 MED ORDER — LORAZEPAM 0.5 MG PO TABS
0.5000 mg | ORAL_TABLET | Freq: Every day | ORAL | Status: DC
  Filled 2020-07-26: qty 1

## 2020-07-26 NOTE — Progress Notes (Signed)
  Speech Language Pathology Treatment: Cognitive-Linquistic  Patient Details Name: Lori Bridges MRN: [id number] DOB: 10/30/1930 Today's Date: 07/26/2020 Time: 6484-7207 SLP Time Calculation (min) (ACUTE ONLY): 12 min  Assessment / Plan / Recommendation Clinical Impression  Lori Bridges had limited participated in cognitive-linguistic treatment. She required mod cues for alertness/participation. She communicated wants/needs in single words throughout this session, by stating, "no" and "nausea" with attempts for PO intake. She was unable to state the month or year, but when given yes/no options, she chose correctly by shaking her head. She appeared largely limited by fatigue. She followed simple commands during oral care with 100% acc'y (open mouth, stick out tongue, spit, try again, etc.) Pt participated in brief conversation about how she felt with use of single words and nodding. Upon attempts at memory tasks, pt ceased participation by closing eyes and going to sleep despite cues. Recommend further ST for speech/cognition/swallowing at next level of care (CIR).   HPI HPI: 86 yo female presenting with AMS and difficulty speaking. Pt admitted with L frontomedial ICH and L temporooccipital ICH, seizure. PMH: GERD, PNA, chronic afib, HTN, HLD, mild memory difficulties      SLP Plan  Continue with current plan of care       Recommendations  Diet recommendations: NPO                General recommendations: Rehab consult Oral Care Recommendations: Oral care QID Follow up Recommendations: Inpatient Rehab SLP Visit Diagnosis: Cognitive communication deficit (K18.288) Plan: Continue with current plan of care                     Rateel Beldin P. Daleyssa Loiselle, M.S., [place] Pathologist Acute Rehabilitation Services Pager: [phone] 07/26/2020, 2:19 PM

## 2020-07-26 NOTE — Progress Notes (Signed)
Pt has been transferred to 4W06. Belongings is sent with pt. Daughter at bedside. Pt reports no pain. Report has been given to oncoming RN.

## 2020-07-26 NOTE — Progress Notes (Signed)
Inpatient Rehab Admissions:  Inpatient Rehab Consult received.  I met with patient and her daughter at the bedside for rehabilitation assessment and to discuss goals and expectations of an inpatient rehab admission.  We discussed expectation of min to mod assist at discharge from CIR in about 3 weeks and daughter, [name], reports family is prepared to take that on.  We also discussed possibility of a short stay on CIR to decrease burden of care and transition to SNF, though family not interested in SNF at all at this time.  Observed pt's participation with therapy at EOB.  I have a bed available for this patient to admit to CIR and Burnetta Sabin, NP, in agreement.  Will let pt/family and TOC team know.    Signed: Shann Medal, PT, DPT Admissions Coordinator [phone] 07/26/20  1:46 PM

## 2020-07-26 NOTE — Progress Notes (Signed)
Occupational Therapy Treatment Patient Details Name: Lori Bridges MRN: [id number] DOB: 1930/11/23 Today's Date: 07/26/2020    History of present illness Pt is 86 yo female admitted with L frontomedial ICH and L temporooccipital ICH, seizure and dysphagia PMH chronic afib on warfarin HTn HLD . Lived at home with grandson, amb with walker and was indep with ADLs.   OT comments  Upon arrival pt sleeping in bed with daughter in room - agreeable to skilled-OT session. Pt very lethargic and recently given Keppra prior to treatment. Pt needing Max - Total A +2 for bed mobility to sit EOB. Pt needing Mod A - supervision with sitting balance with max multimodal cues to correct correct trunk to midline with R and posterior lateral leans. Pt able to wash face at EOB with max A due to decreased strength and coordination in [place]. Performed PROM in BUEs to check for increased tone and stretch muscles to promote use in functional activities. Does continue to demonstrate increased tone in R side, for UE was able to achieve full PROM with exercises and stretches. Completed 3 partial stands with Total A +2 with bilateral knees blocked. Believe dc recommendation still appropriate - will continue to follow acutely.   Follow Up Recommendations  CIR    Equipment Recommendations  Wheelchair (measurements OT);Wheelchair cushion (measurements OT);Hospital bed    Recommendations for Other Services Rehab consult    Precautions / Restrictions Precautions Precautions: Fall Precaution Comments: Cortrak       Mobility Bed Mobility Overal bed mobility: Needs Assistance Bed Mobility: Rolling;Sidelying to Sit;Sit to Supine Rolling: Total assist;+2 for physical assistance Sidelying to sit: Max assist;+2 for physical assistance Supine to sit: Total assist Sit to supine: Total assist;+2 for physical assistance   General bed mobility comments: Max cues and max - total A +2 for trunk and BLEs  Transfers Overall  transfer level: Needs assistance Equipment used: 2 person hand held assist Transfers: Sit to/from Stand Sit to Stand: Total assist;From elevated surface;+2 physical assistance         General transfer comment: Pt able to perform 3 partial stands with Bilateral knees blocked     Balance Overall balance assessment: Needs assistance Sitting-balance support: Bilateral upper extremity supported;No upper extremity supported;Feet supported Sitting balance-Leahy Scale: Poor Sitting balance - Comments: Initially requiring Mod A for support and hold position with supervision needing max multimodal cues to correct and come to midline - pt trying to correct twice. Postural control: Posterior lean;Right lateral lean   Standing balance-Leahy Scale: Zero Standing balance comment: dependent on 2+ physical assist                           ADL either performed or assessed with clinical judgement   ADL Overall ADL's : Needs assistance/impaired     Grooming: Wash/dry face;Maximal assistance;Sitting Grooming Details (indicate cue type and reason): needing max A for sitting upright and bringing hand to face with washcloth                               General ADL Comments: Pt requires max A to min A for static sitting balance at EOB               Cognition Arousal/Alertness: Lethargic Behavior During Therapy: Flat affect Overall Cognitive Status: Impaired/Different from baseline Area of Impairment: Attention;Memory;Following commands;Safety/judgement;Awareness;Problem solving;Orientation  Orientation Level: Disoriented to;Place;Time;Situation Current Attention Level: Focused Memory: Decreased recall of precautions;Decreased short-term memory Following Commands: Follows one step commands inconsistently Safety/Judgement: Decreased awareness of safety;Decreased awareness of deficits Awareness: Intellectual Problem Solving: Decreased  initiation;Difficulty sequencing;Slow processing;Requires verbal cues;Requires tactile cues General Comments: pt with minimal communication vocalizing "uh huh" for some questions. Inconsistently following commands and needing multimodal cues for sequencing with trunk support        Exercises Exercises: General Upper Extremity General Exercises - Upper Extremity Shoulder Flexion: PROM;Both;5 reps;Seated Shoulder Extension: PROM;Both;5 reps;Seated Elbow Flexion: PROM;5 reps;Seated;Both Elbow Extension: PROM;Both;5 reps;Seated Wrist Flexion: PROM;Both;5 reps;Seated Wrist Extension: PROM;Both;5 reps;Seated General Exercises - Lower Extremity Ankle Circles/Pumps: 5 reps;Seated;AAROM;Left;PROM;Right Long Arc Quad: 5 reps;Seated;AAROM;Left;PROM;Right Other Exercises Other Exercises: bil knee flexion x 5 in sitting AAROM on L; PROM on R Other Exercises: Ankle PF stretch on R 30 sec; additionally provided downward pressure through knee in seated position to get foot flat   Shoulder Instructions       General Comments VSS daughter present for treatment    Pertinent Vitals/ Pain       Pain Assessment: Faces Faces Pain Scale: No hurt         Frequency  Min 2X/week        Progress Toward Goals  OT Goals(current goals can now be found in the care plan section)  Progress towards OT goals: Progressing toward goals  Acute Rehab OT Goals Patient Stated Goal: pt unable to state  Plan Discharge plan remains appropriate    Co-evaluation    PT/OT/SLP Co-Evaluation/Treatment: Yes Reason for Co-Treatment: Complexity of the patient's impairments (multi-system involvement);Necessary to address cognition/behavior during functional activity;For patient/therapist safety;To address functional/ADL transfers PT goals addressed during session: Mobility/safety with mobility;Balance OT goals addressed during session: ADL's and self-care;Strengthening/ROM         End of Session Equipment  Utilized During Treatment: Gait belt  OT Visit Diagnosis: Muscle weakness (generalized) (M62.81)   Activity Tolerance Patient tolerated treatment well   Patient Left in chair;with call bell/phone within reach;with chair alarm set   Nurse Communication Mobility status;Precautions        Time: 2446-2863 OT Time Calculation (min): 30 min  Charges: OT General Charges $OT Visit: 1 Visit OT Treatments $Self Care/Home Management : 8-22 mins  Isadora Delorey/OTS   Carnella Fryman 07/26/2020, 2:49 PM

## 2020-07-26 NOTE — H&P (Signed)
Physical Medicine and Rehabilitation Admission H&P    Chief Complaint  Patient presents with  . Weakness  : HPI: Lori Bridges. Gaba is a 86 year old right-handed female history of atrial fibrillation maintained on Coumadin, hypertension, hyperlipidemia, chronic kidney disease.  Per chart review lives with grandson.  Independent with assistive device was able to dress and bathe herself although family notes some recent decrease in memory.  She has 7 children in the area with good support.  Presented July 23, 2020 with increasing confusion as well as slurred speech and subtle hand and forearm twitching..  CT/MRI showed acute intraparenchymal hemorrhage in the left vertex 2.2 x 1.7 x 2.4 cm in size with surrounding vasogenic edema.  Additional intraparenchymal hemorrhage in the left temporal lobe 11 x 6 x 10 mm.  No midline shift or hydrocephalus.  CT angiogram of head no evidence of enhancing mass or lesion.  Admission chemistries with potassium 3.2, glucose 126, creatinine 1.22 alcohol negative SARS coronavirus negative, INR 1.5.  She was loaded with Keppra and EEG negative for seizure.  Patient did receive Kcentra/vitamin K to reverse Coumadin.  Cardene drip initially for blood pressure controlled.  She was cleared to begin subcutaneous heparin for DVT prophylaxis July 25, 2020.  She remains n.p.o. with alternative means of nutritional support.  Patient with findings of E. coli urinary tract infection completing course of Rocephin.  Therapy evaluations completed and patient was admitted for a comprehensive rehab program.  Review of Systems  Constitutional: Negative for chills and fever.  HENT: Negative for hearing loss.   Eyes: Negative for blurred vision and double vision.  Respiratory: Negative for cough and shortness of breath.   Cardiovascular: Positive for palpitations. Negative for chest pain and leg swelling.  Gastrointestinal: Positive for constipation. Negative for heartburn, nausea and  vomiting.       GERD  Genitourinary: Negative for dysuria, flank pain and hematuria.  Musculoskeletal: Positive for myalgias.  Skin: Negative for rash.  Neurological: Positive for speech change.  Psychiatric/Behavioral: Positive for memory loss.       Anxiety  All other systems reviewed and are negative.  Past Medical History:  Diagnosis Date  . Adenomatous colon polyp 2005  . Anxiety age 69  . Carotid artery bruit 2001   left  . CKD (chronic kidney disease), stage III   . DDD (degenerative disc disease)   . DJD (degenerative joint disease)   . Gastroenteritis 2011  . GERD (gastroesophageal reflux disease) 2003  . History of pneumonia   . HTN (hypertension) 1983  . Hyperlipidemia 2000  . Hypothyroidism 2009  . Iron deficiency anemia 1983  . Osteoarthritis 2001  . Osteoporosis 2001  . Permanent atrial fibrillation ([place]) 07/23/2010       . Persistent headaches 1987  . Pulmonary HTN ([place]) 04/03/2017   Moderate with PASP 48mmHg by echo 02/2017  . Tricuspid regurgitation    severe by echo 02/25/12  . Vertigo   . Vitamin D deficiency 2011   Past Surgical History:  Procedure Laterality Date  . COLONOSCOPY W/ BIOPSIES AND POLYPECTOMY  09/16/2004   adenomatous polyp, diverticulosis, internal hemorrhoids  . s/p BLT    . TUBAL LIGATION     Family History  Problem Relation Age of Onset  . Hypertension Father   . Peripheral vascular disease Father   . Diabetes Mother   . Heart disease Brother 56  . Liver cancer Sister    Social History:  reports that she has never smoked. She  has never used smokeless tobacco. She reports that she does not drink alcohol and does not use drugs. Allergies:  Allergies  Allergen Reactions  . Doxycycline Other (See Comments)    UNKNOWN unknown  . Ezetimibe Other (See Comments)    Arthralgia, Myalgias  . Meclizine Other (See Comments) and Palpitations    Tachycardia  . Pravastatin Other (See Comments)    Arthralgia, Myalgias  . Colestipol  Other (See Comments)  . Sulfamethoxazole Other (See Comments)    unknown  . Ace Inhibitors Cough  . Codeine Nausea Only  . Colestipol Hcl Other (See Comments)    Malaise  . Lovastatin Other (See Comments)    UNKNOWN Unknown  . Sulfa Antibiotics Other (See Comments)    unknown   Medications Prior to Admission  Medication Sig Dispense Refill  . Cholecalciferol (VITAMIN D3) 2000 units TABS Take 2,000 Units by mouth daily.    . Cyanocobalamin (VITAMIN B-12 PO) Take 1 tablet by mouth daily.    . furosemide (LASIX) 20 MG tablet Take 1 tablet (20 mg total) by mouth daily. 90 tablet 3  . hydrALAZINE (APRESOLINE) 25 MG tablet Take 1 tablet (25 mg total) by mouth in the morning and at bedtime. (Patient taking differently: Take 25 mg by mouth daily. ) 60 tablet 11  . irbesartan (AVAPRO) 150 MG tablet Take 1 tablet (150 mg total) by mouth daily. 30 tablet 11  . LORazepam (ATIVAN) 1 MG tablet Take 0.5 mg by mouth at bedtime.     . metoprolol succinate (TOPROL XL) 100 MG 24 hr tablet TAKE 1 TABLET(100 MG) BY MOUTH DAILY IN THE MORNING. (Patient taking differently: Take 100 mg by mouth daily. ) 90 tablet 3  . metoprolol succinate (TOPROL XL) 25 MG 24 hr tablet Take 2 tablets (50 mg total) by mouth at bedtime. 180 tablet 3  . pantoprazole (PROTONIX) 40 MG tablet Take 40 mg by mouth daily.     Marland Kitchen SYNTHROID 50 MCG tablet Take 50 mcg by mouth daily before breakfast.   8  . warfarin (COUMADIN) 2 MG tablet Take 1 tablet (2 mg total) by mouth daily. Or as directed by the coumadin clinic (Patient taking differently: Take 2-3 mg by mouth See admin instructions. Take 1 and 1/2 tablet on Sunday then take 1 tablet all the other days) 35 tablet 2    Drug Regimen Review Drug regimen was reviewed and remains appropriate with no significant issues identified  Home: Home Living Family/patient expects to be discharged to:: Private residence Living Arrangements:  (grandson) Available Help at Discharge: Family,  Available 24 hours/day Type of Home: House Additional Comments: pt poor historian, info provided by chart   Functional History: Prior Function Level of Independence: Independent with assistive device(s) Comments: per chart, was walking with RW, was able dress and bath self, per chart per daughter states her memory has started declining and she has started managing pt's finances  Functional Status:  Mobility: Bed Mobility Overal bed mobility: Needs Assistance Bed Mobility: Rolling, Supine to Sit, Sit to Supine Rolling: Total assist Sidelying to sit: Mod assist, Max assist Supine to sit: Total assist Sit to supine: Total assist General bed mobility comments: max directional verbal cues, maxA for LE management off bed, modA for trunk elevation, pt assisted with transfer once initiated  Transfers Overall transfer level: Needs assistance Equipment used: 1 person hand held assist Transfers: Sit to/from Stand Sit to Stand: Total assist, From elevated surface (unable to clear buttocks) Stand pivot transfers: Max  assist, +2 physical assistance General transfer comment: R knee blocked, dependence for R LE advancement, pt unable to sequence stepping, maxAx2 to maintain upright posture Ambulation/Gait General Gait Details: unable at this time    ADL: ADL Overall ADL's : Needs assistance/impaired Grooming: Maximal assistance Lower Body Dressing: Maximal assistance, Sitting/lateral leans Lower Body Dressing Details (indicate cue type and reason): pt using bil Ue to open sock but unable to loop foot into the sock.pt was able to figure 4 R LE but needs (A) to position L LE. pt was unable to continue to pull up L sock. Pt was able to continue sequence on R LE.  General ADL Comments: pt requires (A) for static sitting balance and then progressed to LB dressing task. PT continued to move R sid einto flexion patterns throughout session. pt was abl to move out of them but holding them in that position  at rest  Cognition: Cognition Overall Cognitive Status: Impaired/Different from baseline Arousal/Alertness: Awake/alert Orientation Level: Oriented to person Awareness: Impaired Awareness Impairment: Emergent impairment Problem Solving: Impaired Problem Solving Impairment: Functional basic Cognition Arousal/Alertness: Lethargic, Awake/alert (initially lethargic, becomes more alert with stimulation) Behavior During Therapy: Flat affect Overall Cognitive Status: Impaired/Different from baseline Area of Impairment: Attention, Memory, Following commands, Safety/judgement, Awareness, Problem solving Orientation Level: Disoriented to, Place, Time, Situation Current Attention Level: Focused Memory: Decreased recall of precautions, Decreased short-term memory Following Commands: Follows one step commands with increased time Safety/Judgement: Decreased awareness of safety, Decreased awareness of deficits Awareness: Intellectual Problem Solving: Decreased initiation, Difficulty sequencing, Slow processing, Requires verbal cues, Requires tactile cues General Comments: pt with severe word finding difficulty, despite incresaed tone in R UE and LE pt more apt to follow commands with R UE versus L UE. With tactile cues pt will follow commands with L UE  Physical Exam: Blood pressure (!) 130/92, pulse 99, temperature 97.6 F (36.4 C), temperature source Axillary, resp. rate 14, weight 69.4 kg, SpO2 97 %.  General: Lethargic, No apparent distress HEENT: Head is normocephalic, atraumatic, PERRLA, EOMI, sclera anicteric, oral mucosa pink and moist,  Nasogastric tube in place Neck: Supple without JVD or lymphadenopathy Heart: Reg rate and rhythm. No murmurs rubs or gallops Chest: CTA bilaterally without wheezes, rales, or rhonchi; no distress Abdomen: Soft, non-tender, non-distended, bowel sounds positive. Extremities: No clubbing, cyanosis, or edema. Pulses are 2+ Skin: Clean and intact without signs  of breakdown Neuro: Patient is lethargic but arousable.  She does open her eyes but essentially nonverbal with some grunting sounds.  She would fall back asleep during exam.  Daughter at bedside.  Psych: Pt's affect is lethargic  Results for orders placed or performed during the hospital encounter of 07/22/20 (from the past 48 hour(s))  Urinalysis, Routine w reflex microscopic (not at [facility])     Status: Abnormal   Collection Time: 07/24/20 11:51 AM  Result Value Ref Range   Color, Urine AMBER (A) YELLOW    Comment: BIOCHEMICALS MAY BE AFFECTED BY COLOR   APPearance HAZY (A) CLEAR   Specific Gravity, Urine 1.035 (H) 1.005 - 1.030   pH 5.0 5.0 - 8.0   Glucose, UA NEGATIVE NEGATIVE mg/dL   Hgb urine dipstick LARGE (A) NEGATIVE   Bilirubin Urine NEGATIVE NEGATIVE   Ketones, ur NEGATIVE NEGATIVE mg/dL   Protein, ur 100 (A) NEGATIVE mg/dL   Nitrite NEGATIVE NEGATIVE   Leukocytes,Ua NEGATIVE NEGATIVE   RBC / HPF >50 (H) 0 - 5 RBC/hpf   WBC, UA 6-10 0 - 5 WBC/hpf  Bacteria, UA MANY (A) NONE SEEN   Mucus PRESENT    Hyaline Casts, UA PRESENT     Comment: Performed at [facility], [place] [address] [place], [place] [address]  Urine Culture     Status: Abnormal   Collection Time: 07/24/20  2:13 PM   Specimen: Urine, Random  Result Value Ref Range   Specimen Description URINE, RANDOM    Special Requests      NONE Performed at [facility], [place] [address], [place], [place] [address]    Culture >=100,000 COLONIES/mL ESCHERICHIA COLI (A)    Report Status 07/26/2020 FINAL    Organism ID, Bacteria ESCHERICHIA COLI (A)       Susceptibility   Escherichia coli - MIC*    AMPICILLIN <=2 SENSITIVE Sensitive     CEFAZOLIN <=4 SENSITIVE Sensitive     CEFTRIAXONE <=0.25 SENSITIVE Sensitive     CIPROFLOXACIN <=0.25 SENSITIVE Sensitive     GENTAMICIN <=1 SENSITIVE Sensitive     IMIPENEM <=0.25 SENSITIVE Sensitive     NITROFURANTOIN <=16 SENSITIVE Sensitive     TRIMETH/SULFA <=20  SENSITIVE Sensitive     AMPICILLIN/SULBACTAM <=2 SENSITIVE Sensitive     PIP/TAZO <=4 SENSITIVE Sensitive     * >=100,000 COLONIES/mL ESCHERICHIA COLI  Glucose, capillary     Status: None   Collection Time: 07/24/20  3:58 PM  Result Value Ref Range   Glucose-Capillary 86 70 - 99 mg/dL    Comment: Glucose reference range applies only to samples taken after fasting for at least 8 hours.  Glucose, capillary     Status: None   Collection Time: 07/24/20  7:47 PM  Result Value Ref Range   Glucose-Capillary 77 70 - 99 mg/dL    Comment: Glucose reference range applies only to samples taken after fasting for at least 8 hours.  Glucose, capillary     Status: None   Collection Time: 07/24/20 11:30 PM  Result Value Ref Range   Glucose-Capillary 82 70 - 99 mg/dL    Comment: Glucose reference range applies only to samples taken after fasting for at least 8 hours.  Glucose, capillary     Status: None   Collection Time: 07/25/20  3:34 AM  Result Value Ref Range   Glucose-Capillary 75 70 - 99 mg/dL    Comment: Glucose reference range applies only to samples taken after fasting for at least 8 hours.  CBC     Status: Abnormal   Collection Time: 07/25/20  3:38 AM  Result Value Ref Range   WBC 5.5 4.0 - 10.5 K/uL   RBC 4.54 3.87 - 5.11 MIL/uL   Hemoglobin 13.2 12.0 - 15.0 g/dL   HCT 41.8 36 - 46 %   MCV 92.1 80.0 - 100.0 fL   MCH 29.1 26.0 - 34.0 pg   MCHC 31.6 30.0 - 36.0 g/dL   RDW 18.2 (H) 11.5 - 15.5 %   Platelets 122 (L) 150 - 400 K/uL    Comment: REPEATED TO VERIFY   nRBC 0.0 0.0 - 0.2 %    Comment: Performed at [facility], [place] [address], [place], [place] [address]  Basic metabolic panel     Status: Abnormal   Collection Time: 07/25/20  3:38 AM  Result Value Ref Range   Sodium 145 135 - 145 mmol/L   Potassium 3.1 (L) 3.5 - 5.1 mmol/L   Chloride 113 (H) 98 - 111 mmol/L   CO2 21 (L) 22 - 32 mmol/L  Glucose, Bld 80 70 - 99 mg/dL    Comment: Glucose reference range applies only  to samples taken after fasting for at least 8 hours.   BUN 23 8 - 23 mg/dL   Creatinine, Ser 1.24 (H) 0.44 - 1.00 mg/dL   Calcium 9.9 8.9 - 10.3 mg/dL   GFR calc non Af Amer 39 (L) >60 mL/min   GFR calc Af Amer 45 (L) >60 mL/min   Anion gap 11 5 - 15    Comment: Performed at [facility] [address] [place], [place] [address]  Glucose, capillary     Status: None   Collection Time: 07/25/20  8:03 AM  Result Value Ref Range   Glucose-Capillary 72 70 - 99 mg/dL    Comment: Glucose reference range applies only to samples taken after fasting for at least 8 hours.  Glucose, capillary     Status: None   Collection Time: 07/25/20 11:46 AM  Result Value Ref Range   Glucose-Capillary 89 70 - 99 mg/dL    Comment: Glucose reference range applies only to samples taken after fasting for at least 8 hours.  Magnesium     Status: None   Collection Time: 07/25/20 12:35 PM  Result Value Ref Range   Magnesium 1.9 1.7 - 2.4 mg/dL    Comment: Performed at [facility], [place] [address], [place], [place] [address]  Phosphorus     Status: None   Collection Time: 07/25/20 12:35 PM  Result Value Ref Range   Phosphorus 3.5 2.5 - 4.6 mg/dL    Comment: Performed at [facility], [place] [address] [place], [place] [address]  Magnesium     Status: None   Collection Time: 07/25/20  6:23 PM  Result Value Ref Range   Magnesium 2.0 1.7 - 2.4 mg/dL    Comment: Performed at [facility], [place] [address] [place], [place] [address]  Phosphorus     Status: None   Collection Time: 07/25/20  6:23 PM  Result Value Ref Range   Phosphorus 3.3 2.5 - 4.6 mg/dL    Comment: Performed at [facility], [place] [address], [place], [place] [address]  Glucose, capillary     Status: Abnormal   Collection Time: 07/25/20  7:45 PM  Result Value Ref Range   Glucose-Capillary 123 (H) 70 - 99 mg/dL    Comment: Glucose reference range applies only to samples taken after fasting for at least 8 hours.    Glucose, capillary     Status: Abnormal   Collection Time: 07/26/20 12:28 AM  Result Value Ref Range   Glucose-Capillary 132 (H) 70 - 99 mg/dL    Comment: Glucose reference range applies only to samples taken after fasting for at least 8 hours.  CBC     Status: Abnormal   Collection Time: 07/26/20  2:38 AM  Result Value Ref Range   WBC 4.5 4.0 - 10.5 K/uL   RBC 4.66 3.87 - 5.11 MIL/uL   Hemoglobin 13.5 12.0 - 15.0 g/dL   HCT 42.9 36 - 46 %   MCV 92.1 80.0 - 100.0 fL   MCH 29.0 26.0 - 34.0 pg   MCHC 31.5 30.0 - 36.0 g/dL   RDW 18.5 (H) 11.5 - 15.5 %   Platelets 127 (L) 150 - 400 K/uL    Comment: REPEATED TO VERIFY   nRBC 0.4 (H) 0.0 - 0.2 %    Comment: Performed at [facility],  [address] [place], [place] [address]  Basic metabolic panel     Status: Abnormal   Collection Time: 07/26/20  2:38 AM  Result Value Ref Range   Sodium 147 (H) 135 - 145 mmol/L   Potassium 3.7 3.5 - 5.1 mmol/L   Chloride 117 (H) 98 - 111 mmol/L   CO2 20 (L) 22 - 32 mmol/L   Glucose, Bld 136 (H) 70 - 99 mg/dL    Comment: Glucose reference range applies only to samples taken after fasting for at least 8 hours.   BUN 28 (H) 8 - 23 mg/dL   Creatinine, Ser 1.32 (H) 0.44 - 1.00 mg/dL   Calcium 10.1 8.9 - 10.3 mg/dL   GFR calc non Af Amer 36 (L) >60 mL/min   GFR calc Af Amer 42 (L) >60 mL/min   Anion gap 10 5 - 15    Comment: Performed at [facility] [address] [place], [place] [address]  Magnesium     Status: None   Collection Time: 07/26/20  2:38 AM  Result Value Ref Range   Magnesium 1.9 1.7 - 2.4 mg/dL    Comment: Performed at [facility] [address] [place], [place] [address]  Phosphorus     Status: None   Collection Time: 07/26/20  2:38 AM  Result Value Ref Range   Phosphorus 2.8 2.5 - 4.6 mg/dL    Comment: Performed at [facility] [address], [place], [place] [address]  Glucose, capillary     Status: Abnormal   Collection Time: 07/26/20  4:05 AM   Result Value Ref Range   Glucose-Capillary 131 (H) 70 - 99 mg/dL    Comment: Glucose reference range applies only to samples taken after fasting for at least 8 hours.  Glucose, capillary     Status: Abnormal   Collection Time: 07/26/20  8:21 AM  Result Value Ref Range   Glucose-Capillary 124 (H) 70 - 99 mg/dL    Comment: Glucose reference range applies only to samples taken after fasting for at least 8 hours.   EEG adult  Result Date: 07/24/2020 Lora Havens, MD     07/25/2020  2:01 PM Patient Name:Lori Bridges VOZ:[id number] Epilepsy Attending:Priyanka [name] Referring Physician/Provider:Dr Rosalin Hawking Date:07/24/2020 Duration: 23.40 mins  Patient history:86 y/o F on Warfarin p/w L frontomedial ICH and another smaller L temporooccipital ICH with associated vasogenic edema.On exam noted to haveR leg and arm twitching, Unable to abduct R eye and adduct Left eye. EEG to evaluate for seizure  Level of alertness:lethargic/ asleep  AEDs during EEG study:Keppra, ativan  Technical aspects: This EEG study was done with scalp electrodes positioned according to the 10-20 International system of electrode placement. Electrical activity was acquired at a sampling rate of 500Hz  and reviewed with a high frequency filter of 70Hz  and a low frequency filter of 1Hz . EEG data were recorded continuously and digitally stored.  Description:Noposterior dominant rhythm was seen.  Sleep was characterized by vertex waves, sleep spindles (12 to 14 Hz), maximal frontocentral region.  EEG showed continuous generalized and maximal left parieto-occipital region 3 to 6 Hz theta-delta slowing. Single sharp transient was seen in left posterior temporal region.  Hyperventilation and photic stimulation were not performed.  ABNORMALITY -Continuousslow, generalized and maximal left parieto-occipital region  IMPRESSION: This study is suggestive of cortical dysfunction in left parietal- occipital region likely  secondary to underlying hemorrhage as well as moderate diffuse encephalopathy, nonspecific etiology but  likely related to sedation,post-ictal state.No seizures ordefiniteepileptiform discharges were seen throughout the recording.   Priyanka [name]  Overnight EEG with video  Result Date: 07/25/2020 Lora Havens, MD     07/25/2020  2:00 PM Patient Name:Lori Bridges UJW:[id number] Epilepsy Attending:Priyanka [name] Referring Physician/Provider:Dr Rosalin Hawking Duration: 07/24/2020 1119 to 07/25/2020 1124  Patient history:86 y/o F on Warfarin p/w L frontomedial ICH and another smaller L temporooccipital ICH with associated vasogenic edema.has intermittent but frequent episodes of right facial twitching, right upper extremity tonic extension for by rhythmic jerking.  EEG to evaluate for seizure  Level of alertness:lethargic/ asleep  AEDs during EEG study:Keppra, ativan  Technical aspects: This EEG study was done with scalp electrodes positioned according to the 10-20 International system of electrode placement. Electrical activity was acquired at a sampling rate of 500Hz  and reviewed with a high frequency filter of 70Hz  and a low frequency filter of 1Hz . EEG data were recorded continuously and digitally stored.  Description:Noposterior dominant rhythm was seen.Sleep was characterized by vertex waves, sleep spindles (12 to 14 Hz), maximal frontocentral region. EEG showed continuous generalized and maximal left parieto-occipital region 3 to 6 Hz theta-delta slowing.  When patient was awake or stimulated, EEG showed rhythmic 2 to 5 Hz theta-delta slowing in left hemisphere, maximal left frontocentral region without any evolution, Sharp waves were seen in left frontal region.  Hyperventilation and photic stimulation were not performed.  ABNORMALITY - Sharp waves, left frontal region -Continuousslow, generalized and maximal left parieto-occipital region  IMPRESSION: This study is suggestive  of epileptogenicity in left frontal region as well as cortical dysfunction in left parieto- occipital region likely secondary to underlying hemorrhage. Additionally, there is evidence of moderate diffuse encephalopathy, nonspecific etiology but likely related to sedation,post-ictal state.No seizures ordefiniteepileptiform discharges were seen throughout the recording.  Lora Havens  Medical Problem List and Plan: 1.  Decreased functional mobility with altered mental status secondary to small left frontal medial ICH and left temporal occipital ICH in the setting of chronic Coumadin/likely hypertensive  -patient may shower  -ELOS/Goals: 27-30 days Min A in PT, OT, SLP 2.  Antithrombotics: -DVT/anticoagulation: Subcutaneous heparin for DVT prophylaxis initiated July 25, 2020  -antiplatelet therapy: N/A 3. Pain Management: Tylenol as needed 4. Mood: Ativan 0.5 mg nightly.   7/29: Change Ativan to PRN to minimize oversedation  -antipsychotic agents: N/A 5. Neuropsych: This patient is not capable of making decisions on her own behalf. 6. Skin/Wound Care: Routine skin checks 7. Fluids/Electrolytes/Nutrition: Routine in and outs with follow-up chemistries 8.  Seizure prophylaxis.  Keppra 1000 mg twice daily.  EEG negative 9.  Atrial fibrillation.  Chronic Coumadin reversed.  Cardiac rate controlled.  Continue Lopressor 50 mg twice daily 10.  Dysphagia.  Currently NPO with nasogastric tube feeds.  Dietary follow-up 11.  Hypertension.  Lasix 20 mg daily, hydralazine 25 mg twice daily, Avapro 150 mg daily, Toprol XL 100 mg daily currently on hold.  Resume as needed  5/62: Diastolic slightly elevated. Continue to monitor.  12.  Hypothyroidism.  Synthroid 13.  Hyperlipidemia.  Statin on hold in the setting of ICH 14.  UTI/E. coli.  Completing course of Rocephin 15.  GERD.  Protonix 16. Disposition: She lives at home with her grandson. She has seven children nearby who can [rovide support upon  discharge.   Lavon Paganini Angiulli, PA-C 07/26/2020   I have personally performed a face to face diagnostic evaluation, including, but not limited to relevant history and physical exam  findings, of this patient and developed relevant assessment and plan.  Additionally, I have reviewed and concur with the physician assistant's documentation above.  Leeroy Cha, MD

## 2020-07-26 NOTE — Progress Notes (Signed)
Physical Medicine and Rehabilitation Consult Reason for Consult: Decreased functional mobility with slurred speech Referring Physician: Dr.Xu     HPI: Lori Bridges is a 86 y.o. right-handed female with history of atrial fibrillation maintained on Coumadin, hypertension, hyperlipidemia, chronic kidney disease.  Per chart review patient lives with her grandson.  Independent with assistive device and was able to dress and bathe herself although family notes some recent decrease in memory.  She has 7 children in the area that check on her routinely.  Presented 07/23/2020 with increasing confusion and slurred speech as well as questionable seizure.  CT/MRI showed acute intraparenchymal hemorrhage in the left vertex 2.2 x 1.7 x 2.4 cm in size with surrounding vasogenic edema.  Additional intraparenchymal hemorrhage in the left temporal lobe 11 x 6 x 10 mm.  No midline shift or hydrocephalus.  Admission chemistries alcohol negative, SARS coronavirus negative, potassium 3.2, glucose 126, creatinine 1.22 INR 1.5 Patient was loaded with Keppra with EEG pending.  Cardene drip for blood pressure control.  Therapy evaluations completed with recommendations of physical medicine rehab consult.     Review of Systems  Constitutional: Negative for chills and fever.  HENT: Negative for hearing loss.   Eyes: Negative for blurred vision and double vision.  Respiratory: Negative for shortness of breath.   Cardiovascular: Negative for chest pain.  Gastrointestinal: Positive for constipation. Negative for heartburn and nausea.       GERD  Genitourinary: Negative for dysuria, flank pain and hematuria.  Musculoskeletal: Positive for myalgias.  Skin: Negative for rash.  Neurological: Positive for speech change.  Psychiatric/Behavioral: Positive for memory loss.       Anxiety  All other systems reviewed and are negative.       Past Medical History:  Diagnosis Date  . Adenomatous colon polyp 2005  .  Anxiety age 58  . Carotid artery bruit 2001    left  . CKD (chronic kidney disease), stage III    . DDD (degenerative disc disease)    . DJD (degenerative joint disease)    . Gastroenteritis 2011  . GERD (gastroesophageal reflux disease) 2003  . History of pneumonia    . HTN (hypertension) 1983  . Hyperlipidemia 2000  . Hypothyroidism 2009  . Iron deficiency anemia 1983  . Osteoarthritis 2001  . Osteoporosis 2001  . Permanent atrial fibrillation ([place]) 07/23/2010        . Persistent headaches 1987  . Pulmonary HTN ([place]) 04/03/2017    Moderate with PASP 37mmHg by echo 02/2017  . Tricuspid regurgitation      severe by echo 02/25/12  . Vertigo    . Vitamin D deficiency 2011         Past Surgical History:  Procedure Laterality Date  . COLONOSCOPY W/ BIOPSIES AND POLYPECTOMY   09/16/2004    adenomatous polyp, diverticulosis, internal hemorrhoids  . s/p BLT      . TUBAL LIGATION             Family History  Problem Relation Age of Onset  . Hypertension Father    . Peripheral vascular disease Father    . Diabetes Mother    . Heart disease Brother 5  . Liver cancer Sister      Social History:  reports that she has never smoked. She has never used smokeless tobacco. She reports that she does not drink alcohol and does not use drugs. Allergies:  Allergies  Allergen Reactions  . Doxycycline Other (See Comments)      UNKNOWN unknown  . Ezetimibe Other (See Comments)      Arthralgia, Myalgias  . Meclizine Other (See Comments) and Palpitations      Tachycardia  . Pravastatin Other (See Comments)      Arthralgia, Myalgias  . Colestipol Other (See Comments)  . Sulfamethoxazole Other (See Comments)      unknown  . Ace Inhibitors Cough  . Codeine Nausea Only  . Colestipol Hcl Other (See Comments)      Malaise  . Lovastatin Other (See Comments)      UNKNOWN Unknown  . Sulfa Antibiotics Other (See Comments)      unknown          Medications Prior to Admission    Medication Sig Dispense Refill  . Cholecalciferol (VITAMIN D3) 2000 units TABS Take 2,000 Units by mouth daily.      . Cyanocobalamin (VITAMIN B-12 PO) Take 1 tablet by mouth daily.      . furosemide (LASIX) 20 MG tablet Take 1 tablet (20 mg total) by mouth daily. 90 tablet 3  . hydrALAZINE (APRESOLINE) 25 MG tablet Take 1 tablet (25 mg total) by mouth in the morning and at bedtime. (Patient taking differently: Take 25 mg by mouth daily. ) 60 tablet 11  . irbesartan (AVAPRO) 150 MG tablet Take 1 tablet (150 mg total) by mouth daily. 30 tablet 11  . LORazepam (ATIVAN) 1 MG tablet Take 0.5 mg by mouth at bedtime.       . metoprolol succinate (TOPROL XL) 100 MG 24 hr tablet TAKE 1 TABLET(100 MG) BY MOUTH DAILY IN THE MORNING. (Patient taking differently: Take 100 mg by mouth daily. ) 90 tablet 3  . metoprolol succinate (TOPROL XL) 25 MG 24 hr tablet Take 2 tablets (50 mg total) by mouth at bedtime. 180 tablet 3  . pantoprazole (PROTONIX) 40 MG tablet Take 40 mg by mouth daily.       Marland Kitchen SYNTHROID 50 MCG tablet Take 50 mcg by mouth daily before breakfast.    8  . warfarin (COUMADIN) 2 MG tablet Take 1 tablet (2 mg total) by mouth daily. Or as directed by the coumadin clinic (Patient taking differently: Take 2-3 mg by mouth See admin instructions. Take 1 and 1/2 tablet on Sunday then take 1 tablet all the other days) 35 tablet 2      Home: [place] expects to be discharged to:: Private residence Living Arrangements:  (grandson) Available Help at Discharge: Family, Available 24 hours/day Type of Home: House Additional Comments: pt poor historian, info provided by chart  Functional History: Prior Function Level of Independence: Independent with assistive device(s) Comments: per chart, was walking with RW, was able dress and bath self, per chart per daughter states her memory has started declining and she has started managing pt's finances Functional Status:  Mobility: Bed  Mobility Overal bed mobility: Needs Assistance Bed Mobility: Rolling, Sidelying to Sit Rolling: Mod assist, Max assist Sidelying to sit: Mod assist, Max assist General bed mobility comments: max directional verbal cues, maxA for LE management off bed, modA for trunk elevation, pt assisted with transfer once initiated  Transfers Overall transfer level: Needs assistance Equipment used:  (2 person lift with gait belt) Transfers: Stand Pivot Transfers, Sit to/from Stand Sit to Stand: Max assist, +2 physical assistance Stand pivot transfers: Max assist, +2 physical assistance General transfer comment: R knee blocked, dependence for R  LE advancement, pt unable to sequence stepping, maxAx2 to maintain upright posture Ambulation/Gait General Gait Details: unable at this time   ADL:   Cognition: Cognition Overall Cognitive Status: Impaired/Different from baseline (assessment also limited by language deficits) Arousal/Alertness: Awake/alert Orientation Level:  (difficulty stating) Awareness: Impaired Awareness Impairment: Emergent impairment Problem Solving: Impaired Problem Solving Impairment: Functional basic Cognition Arousal/Alertness: Awake/alert Behavior During Therapy: Flat affect Overall Cognitive Status: Impaired/Different from baseline (assessment also limited by language deficits) Area of Impairment: Orientation, Attention, Memory, Following commands, Safety/judgement, Awareness, Problem solving Orientation Level: Disoriented to, Place, Time, Situation Current Attention Level: Focused Memory: Decreased short-term memory Following Commands: Follows one step commands with increased time, Follows one step commands inconsistently (simple commands about 75% of time) Safety/Judgement: Decreased awareness of deficits Awareness: Intellectual Problem Solving: Decreased initiation, Difficulty sequencing, Slow processing, Requires verbal cues, Requires tactile cues General Comments: pt  with severe word finding difficulty, despite incresaed tone in R UE and LE pt more apt to follow commands with R UE versus L UE. With tactile cues pt will follow commands with L UE   Blood pressure (!) 116/62, pulse (!) 33, temperature (!) 97.4 F (36.3 C), temperature source Axillary, resp. rate 17, weight 69.4 kg, SpO2 97 %. Physical Exam   General: No apparent distress HEENT: Head is normocephalic, atraumatic, eyes mostly closed, when open has palsy of R lateral rectus and left medial rectus. Neck: Supple without JVD or lymphadenopathy Heart: Irregularly irregular rate and rhythm. No murmurs rubs or gallops Chest: CTA bilaterally without wheezes, rales, or rhonchi; no distress Abdomen: Soft, non-tender, non-distended, bowel sounds positive. Extremities: No clubbing, cyanosis, or edema. Pulses are 2+ Skin: Clean and intact without signs of breakdown Neuro: Patient is awake and alert, but eyes are mostly closed.  She does make eye contact with examiner.  Patient is aphasic but was able to state her name.  Most of the words were mumbling- expressive aphasia. Inconsistent to follow commands. Unable to engage in MMT  Psych: Pt's affect is appropriate. Pt is cooperative  Lab Results Last 24 Hours        Results for orders placed or performed during the hospital encounter of 07/22/20 (from the past 24 hour(s))  Protime-INR     Status: Abnormal    Collection Time: 07/23/20  2:03 PM  Result Value Ref Range    Prothrombin Time 16.1 (H) 11.4 - 15.2 seconds    INR 1.3 (H) 0.8 - 1.2  MRSA PCR Screening     Status: None    Collection Time: 07/23/20  3:06 PM    Specimen: Nasal Mucosa; Nasopharyngeal  Result Value Ref Range    MRSA by PCR NEGATIVE NEGATIVE  No blood products     Status: None    Collection Time: 07/23/20  6:29 PM  Result Value Ref Range    Transfuse no blood products          TRANSFUSE NO BLOOD PRODUCTS, VERIFIED BY Holland Commons, RN Performed at [facility], [address], [place], [place] [address]    Protime-INR     Status: Abnormal    Collection Time: 07/23/20  9:14 PM  Result Value Ref Range    Prothrombin Time 16.7 (H) 11.4 - 15.2 seconds    INR 1.4 (H) 0.8 - 1.2  CBC     Status: Abnormal    Collection Time: 07/24/20  4:07 AM  Result Value Ref Range    WBC 3.9 (L) 4.0 - 10.5 K/uL  RBC 4.67 3.87 - 5.11 MIL/uL    Hemoglobin 13.8 12.0 - 15.0 g/dL    HCT 43.2 36 - 46 %    MCV 92.5 80.0 - 100.0 fL    MCH 29.6 26.0 - 34.0 pg    MCHC 31.9 30.0 - 36.0 g/dL    RDW 18.3 (H) 11.5 - 15.5 %    Platelets 148 (L) 150 - 400 K/uL    nRBC 0.0 0.0 - 0.2 %  Basic metabolic panel     Status: Abnormal    Collection Time: 07/24/20  4:07 AM  Result Value Ref Range    Sodium 143 135 - 145 mmol/L    Potassium 3.4 (L) 3.5 - 5.1 mmol/L    Chloride 111 98 - 111 mmol/L    CO2 21 (L) 22 - 32 mmol/L    Glucose, Bld 111 (H) 70 - 99 mg/dL    BUN 20 8 - 23 mg/dL    Creatinine, Ser 1.28 (H) 0.44 - 1.00 mg/dL    Calcium 9.8 8.9 - 10.3 mg/dL    GFR calc non Af Amer 37 (L) >60 mL/min    GFR calc Af Amer 43 (L) >60 mL/min    Anion gap 11 5 - 15  Protime-INR     Status: Abnormal    Collection Time: 07/24/20  4:07 AM  Result Value Ref Range    Prothrombin Time 16.9 (H) 11.4 - 15.2 seconds    INR 1.4 (H) 0.8 - 1.2  Urinalysis, Routine w reflex microscopic (not at [facility])     Status: Abnormal    Collection Time: 07/24/20 11:51 AM  Result Value Ref Range    Color, Urine AMBER (A) YELLOW    APPearance HAZY (A) CLEAR    Specific Gravity, Urine 1.035 (H) 1.005 - 1.030    pH 5.0 5.0 - 8.0    Glucose, UA NEGATIVE NEGATIVE mg/dL    Hgb urine dipstick LARGE (A) NEGATIVE    Bilirubin Urine NEGATIVE NEGATIVE    Ketones, ur NEGATIVE NEGATIVE mg/dL    Protein, ur 100 (A) NEGATIVE mg/dL    Nitrite NEGATIVE NEGATIVE    Leukocytes,Ua NEGATIVE NEGATIVE    RBC / HPF >50 (H) 0 - 5 RBC/hpf    WBC, UA 6-10 0 - 5 WBC/hpf    Bacteria, UA MANY (A) NONE SEEN    Mucus PRESENT      Hyaline  Casts, UA PRESENT    BLOOD TRANSFUSION REPORT - SCANNED     Status: None    Collection Time: 07/24/20 12:08 PM    Narrative    Ordered by an unspecified provider.       Imaging Results (Last 48 hours)  CT ANGIO HEAD W OR WO CONTRAST   Result Date: 07/23/2020 CLINICAL DATA:  Follow-up hemorrhage EXAM: CT ANGIOGRAPHY HEAD TECHNIQUE: Multidetector CT imaging of the head was performed using the standard protocol during bolus administration of intravenous contrast. Multiplanar CT image reconstructions and MIPs were obtained to evaluate the vascular anatomy. CONTRAST:  23mL OMNIPAQUE IOHEXOL 350 MG/ML SOLN COMPARISON:  07/22/2020 FINDINGS: CT HEAD Brain: Slight interval contraction of the hematoma in the medial left parietal lobe, measuring 23 x 24 x 18 mm compared with 25 x 23 x 19 mm yesterday. Amount of adjacent vasogenic edema appears similar. Small amount of adjacent subarachnoid blood is slightly reduced. No new brain abnormality. Atrophy and chronic small-vessel ischemic change of the white matter elsewhere. No hydrocephalus. Vascular: There is atherosclerotic calcification of the major  vessels at the base of the brain. Skull: Negative Sinuses: Clear Orbits: Normal CTA HEAD Anterior circulation: Both internal carotid arteries are patent through the skull base and siphon regions. Ordinary siphon atherosclerotic calcification with maximal stenosis estimated at 30%. The anterior and middle cerebral vessels are patent. Both anterior cerebral is receive most of there supply from the left carotid circulation. Flow is present in the anterior and middle cerebral arteries. No sign of vascular malformation in the region of the left parietal hemorrhage. Additional intraparenchymal hemorrhage in the inferolateral left temporal lobe measuring maximally 11 mm. Minimal surrounding edema. No sign of enhancement or abnormal vessels in that region. Posterior circulation: Both vertebral arteries are patent to the basilar.  No basilar stenosis. Posterior circulation branch vessels show flow. Venous sinuses: Early venous phase imaging does not allow accurate venography. I do not see any finding suggesting venous thrombosis, but this study does not represent a true CT venogram. Anatomic variants: None significant. IMPRESSION: Slight contraction of the left parietal hematoma and adjacent subarachnoid blood. No increase in edema or mass effect. No evidence of enhancing mass or high flow vascular malformation. Diminishing density of a hemorrhage in the left inferolateral temporal lobe, maximal dimension 11 mm. Very small amount of adjacent subarachnoid blood. No enhancement in this region. No new brain finding. Study does not serve as a true CT venogram. No positive evidence of venous thrombosis. This pattern of disease suggests that these may be post traumatic hemorrhages. Electronically Signed   By: Nelson Chimes M.D.   On: 07/23/2020 14:11    CT HEAD WO CONTRAST   Result Date: 07/22/2020 CLINICAL DATA:  Found at 1340 hours by daughter leaning against wall between the bed and a dresser, aphasic, question stroke; history pulmonary hypertension, atrial fibrillation, hypertension, stage III chronic kidney disease, CHF EXAM: CT HEAD WITHOUT CONTRAST TECHNIQUE: Contiguous axial images were obtained from the base of the skull through the vertex without intravenous contrast. Sagittal and coronal MPR images reconstructed from axial data set. COMPARISON:  None FINDINGS: Brain: Generalized atrophy. Normal ventricular morphology. High attenuation acute intraparenchymal hemorrhage identified at LEFT vertex 2.2 x 1.7 x 2.4 cm in size with surrounding vasogenic edema. Extra-axial extension of hemorrhage along the falx and medial sulci to LEFT vertex. No midline shift. Additional intraparenchymal hemorrhage at LEFT temporal lobe 11 x 6 x 10 mm. These could be due to hemorrhagic metastases, less likely hemorrhagic infarct. No additional infarcts,  mass, or hemorrhage identified. Vascular: No hyperdense vessels Skull: Intact Sinuses/Orbits: Clear Other: N/A IMPRESSION: Acute intraparenchymal hemorrhage at LEFT vertex 2.2 x 1.7 x 2.4 cm in size with surrounding vasogenic edema. Extra-axial extension of hemorrhage along the falx and interhemispheric fissure. Additional intraparenchymal hemorrhage at LEFT temporal lobe 11 x 6 x 10 mm. Findings are concerning for hemorrhagic metastases and further assessment by MR imaging with and without contrast recommended. No midline shift or hydrocephalus. Critical Value/emergent results were called by telephone at the time of interpretation on 07/22/2020 at 5:28 pm to provider [facility] , who verbally acknowledged these results. Electronically Signed   By: Lavonia Dana M.D.   On: 07/22/2020 17:30    MR BRAIN W WO CONTRAST   Result Date: 07/22/2020 CLINICAL DATA:  Multifocal intracranial hemorrhage EXAM: MRI HEAD WITHOUT AND WITH CONTRAST TECHNIQUE: Multiplanar, multiecho pulse sequences of the brain and surrounding structures were obtained without and with intravenous contrast. CONTRAST:  15mL GADAVIST GADOBUTROL 1 MMOL/ML IV SOLN COMPARISON:  Head CT 07/22/2020 Brain MRI 04/30/2012 FINDINGS:  Brain: Unchanged intraparenchymal hemorrhage in the left parietal lobe and left temporal lobe. Mild edema at both locations, greater in the parietal lobe. No acute ischemia. No midline shift or other mass effect. Multifocal white matter hyperintensity, most commonly due to chronic ischemic microangiopathy. No hydrocephalus. There is a small amount of subarachnoid blood over the left temporal convexity. No chronic microhemorrhage. Normal midline structures. Postcontrast images are motion degraded there is no appreciable contrast enhancement at the sites of hemorrhage. Vascular: Normal flow voids. Skull and upper cervical spine: Normal marrow signal. Sinuses/Orbits: Negative. Other: None. IMPRESSION: 1. Unchanged intraparenchymal  hemorrhage in the left parietal lobe and left temporal lobe with mild edema. 2. No abnormal contrast enhancement at either hemorrhage site to indicate the presence of a mass. Electronically Signed   By: Ulyses Jarred M.D.   On: 07/22/2020 21:49    EEG adult   Result Date: 07/22/2020 Lora Havens, MD     07/23/2020  9:06 AM Patient Name: ABILENE MCPHEE MRN: [id number] Epilepsy Attending: Lora Havens Referring Physician/Provider: Dr Donnetta Simpers Date: 07/22/2020 Duration: 24.02 mins Patient history: 86 y/o F on Warfarin p/w L frontomedial ICH and another smaller L temporooccipital ICH with associated vasogenic edema. On exam noted to have R leg and arm twitching, Unable to abduct R eye and adduct Left eye. EEG to evaluate for seizure Level of alertness: lethargic AEDs during EEG study: Keppra, ativan Technical aspects: This EEG study was done with scalp electrodes positioned according to the 10-20 International system of electrode placement. Electrical activity was acquired at a sampling rate of 500Hz  and reviewed with a high frequency filter of 70Hz  and a low frequency filter of 1Hz . EEG data were recorded continuously and digitally stored. Description: No posterior dominant rhythm was seen. EEG showed continuous generalized 3 to 6 Hz theta-delta slowing admixed with15 to 18 Hz beta activity with irregular morphology distributed symmetrically and diffusely.  Hyperventilation and photic stimulation were not performed.   ABNORMALITY -Excessive beta, generalized -Continuous slow, generalized IMPRESSION: This study is suggestive of moderate diffuse encephalopathy, nonspecific etiology but likely related to sedation, post-ictal state. The excessive beta activity seen in the background is most likely due to the effect of benzodiazepine and is a benign EEG pattern. No seizures or definite epileptiform discharges were seen throughout the recording. Priyanka [name]    Overnight EEG with video   Result  Date: 07/23/2020 Lora Havens, MD     07/23/2020  9:41 AM Patient Name: ROSELLEN LICHTENBERGER MRN: [id number] Epilepsy Attending: Lora Havens Referring Physician/Provider: Dr Donnetta Simpers Duration: 07/22/2020 2303 to 07/23/2020 0930   Patient history: 86 y/o F on Warfarin p/w L frontomedial ICH and another smaller L temporooccipital ICH with associated vasogenic edema.On exam noted to have R leg and arm twitching, Unable to abduct R eye and adduct Left eye. EEG to evaluate for seizure   Level of alertness: lethargic, asleep   AEDs during EEG study: Keppra, ativan   Technical aspects: This EEG study was done with scalp electrodes positioned according to the 10-20 International system of electrode placement. Electrical activity was acquired at a sampling rate of 500Hz  and reviewed with a high frequency filter of 70Hz  and a low frequency filter of 1Hz . EEG data were recorded continuously and digitally stored.   Description: No posterior dominant rhythm was seen.   Sleep was characterized by vertex waves, sleep spindles (12 to 14 Hz), maximal frontocentral region.  EEG showed continuous generalized 3 to 6  Hz theta-delta slowing admixed with15 to 18 Hz beta activity with irregular morphology distributed symmetrically and diffusely.  Hyperventilation and photic stimulation were not performed.   ABNORMALITY -Excessive beta, generalized -Continuous slow, generalized   IMPRESSION: This study is suggestive of moderate diffuse encephalopathy, nonspecific etiology but likely related to sedation, post-ictal state. The excessive beta activity seen in the background is most likely due to the effect of benzodiazepine and is a benign EEG pattern. No seizures or definite epileptiform discharges were seen throughout the recording.     Priyanka O Yadav     VAS US CAROTID   Result Date: 07/23/2020 Carotid Arterial Duplex Study Indications:       CVA. Risk Factors:      Hypertension, hyperlipidemia. Limitations        Today's  exam was limited due to the patient's respiratory                    variation and patient positioning. Comparison Study:  no prior Performing Technologist: Abram Sander RVS  Examination Guidelines: A complete evaluation includes B-mode imaging, spectral Doppler, color Doppler, and power Doppler as needed of all accessible portions of each vessel. Bilateral testing is considered an integral part of a complete examination. Limited examinations for reoccurring indications may be performed as noted.  Right Carotid Findings: +----------+--------+--------+--------+------------------+--------------+           PSV cm/sEDV cm/sStenosisPlaque DescriptionComments       +----------+--------+--------+--------+------------------+--------------+ CCA Prox  34      7               heterogenous                     +----------+--------+--------+--------+------------------+--------------+ CCA Distal26      4               heterogenous                     +----------+--------+--------+--------+------------------+--------------+ ICA Prox  27      6       1-39%   heterogenous                     +----------+--------+--------+--------+------------------+--------------+ ICA Distal33      8                                                +----------+--------+--------+--------+------------------+--------------+ ECA                                                 Not visualized +----------+--------+--------+--------+------------------+--------------+ +----------+--------+-------+--------+-------------------+           PSV cm/sEDV cmsDescribeArm Pressure (mmHG) +----------+--------+-------+--------+-------------------+ [id number]                                         +----------+--------+-------+--------+-------------------+ +---------+--------+--------+--------------+ VertebralPSV cm/sEDV cm/sNot identified +---------+--------+--------+--------------+  Left Carotid Findings:  +----------+--------+--------+--------+------------------+--------+           PSV cm/sEDV cm/sStenosisPlaque DescriptionComments +----------+--------+--------+--------+------------------+--------+ CCA Prox  49      9  heterogenous               +----------+--------+--------+--------+------------------+--------+ CCA Distal40      11              heterogenous               +----------+--------+--------+--------+------------------+--------+ ICA Prox  35      9       1-39%   heterogenous               +----------+--------+--------+--------+------------------+--------+ ICA Distal54      14                                         +----------+--------+--------+--------+------------------+--------+ ECA       25                                                 +----------+--------+--------+--------+------------------+--------+ +----------+--------+--------+--------+-------------------+           PSV cm/sEDV cm/sDescribeArm Pressure (mmHG) +----------+--------+--------+--------+-------------------+ [id number]                                          +----------+--------+--------+--------+-------------------+ +---------+--------+--+--------+-+---------+ VertebralPSV cm/s30EDV cm/s8Antegrade +---------+--------+--+--------+-+---------+   Summary: Right Carotid: Velocities in the right ICA are consistent with a 1-39% stenosis. Left Carotid: Velocities in the left ICA are consistent with a 1-39% stenosis. Vertebrals: Left vertebral artery demonstrates antegrade flow. Right vertebral             artery was not visualized. *See table(s) above for measurements and observations.  Electronically signed by Antony Contras MD on 07/23/2020 at 12:48:50 PM.    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 Does the need for close, 24 hr/day medical supervision in concert with the patient's rehab needs make it  unreasonable for this patient to be served in a less intensive setting? Yes 2. Co-Morbidities requiring supervision/potential complications: bilateral occipital cortical dysfunction, cerebral hemorrhage, moderate diffuse encephalopathy 3. Due to bladder management, bowel management, safety, skin/wound care, disease management, medication administration, pain management and patient education, does the patient require 24 hr/day rehab nursing? Yes 4. Does the patient require coordinated care of a physician, rehab nurse, therapy disciplines of PT, OT, SLP to address physical and functional deficits in the context of the above medical diagnosis(es)? Yes Addressing deficits in the following areas: balance, endurance, locomotion, strength, transferring, bowel/bladder control, bathing, dressing, feeding, grooming, toileting, cognition, speech, language, swallowing and psychosocial support 5. Can the patient actively participate in an intensive therapy program of at least 3 hrs of therapy per day at least 5 days per week? Yes 6. The potential for patient to make measurable gains while on inpatient rehab is fair 7. Anticipated functional outcomes upon discharge from inpatient rehab are mod assist  with PT, mod assist with OT, mod assist with SLP. 8. Estimated rehab length of stay to reach the above functional goals is: 20-22 days 9. Anticipated discharge destination: Home 10. Overall Rehab/Functional Prognosis: fair   RECOMMENDATIONS: This patient's condition is appropriate for continued rehabilitative care in the following setting: CIR Patient has agreed to participate in recommended program. Yes  Note that insurance prior authorization may be required for reimbursement for recommended care.   Comment: Mrs. Vandermeer would be an appropriate CIR candidate if she has significant home assistance upon discharge. Can consider Baclofen 5mg  for increased tone on right side, but it may worsen cognitive deficits.      Lavon Paganini Angiulli, PA-C 07/24/2020    I have personally performed a face to face diagnostic evaluation, including, but not limited to relevant history and physical exam findings, of this patient and developed relevant assessment and plan.  Additionally, I have reviewed and concur with the physician assistant's documentation above.   Leeroy Cha, MD

## 2020-07-26 NOTE — TOC Transition Note (Signed)
Transition of Care [facility]) - CM/SW Discharge Note   Patient Details  Name: Lori Bridges MRN: [id number] Date of Birth: 27-Jun-1930  [facility] CM/SW Contact:  Pollie Friar, RN Phone Number: 07/26/2020, 1:54 PM   Clinical Narrative:    Pt is discharging to CIR today. CM signing off.    Final next level of care: IP Rehab Facility Barriers to Discharge: No Barriers Identified   Patient Goals and CMS Choice        Discharge Placement                       Discharge Plan and Services                                     Social Determinants of Health (SDOH) Interventions     Readmission Risk Interventions No flowsheet data found.

## 2020-07-26 NOTE — Progress Notes (Signed)
Physical Therapy Treatment Patient Details Name: Lori Bridges MRN: [id number] DOB: [birth year] Today's Date: 07/26/2020    History of Present Illness Pt is 86 yo female admitted with L frontomedial ICH and L temporooccipital ICH, seizure and dysphagia PMH chronic afib on warfarin HTn HLD . Lived at home with grandson, amb with walker and was indep with ADLs.    PT Comments    Pt was lethargic at time of treatment (recently received Keppra), but did demonstrate improved sitting balance for short periods of time (30 sec to 2 mins with supervision) and partial stands (total assist).  Continues to required skilled assist of 2 to provide facilitation and total assist.  Does continue to demonstrate increased tone in R side, for LE was able to achieve full PROM with exercises and stretches (see OT for UE)    Follow Up Recommendations  [facility] bed;Wheelchair (measurements PT);Wheelchair cushion (measurements PT)    Recommendations for Other Services Rehab consult     Precautions / Restrictions Precautions Precautions: Fall Precaution Comments: Cortrak    Mobility  Bed Mobility Overal bed mobility: Needs Assistance Bed Mobility: Rolling;Sidelying to Sit;Sit to Supine Rolling: Total assist;+2 for physical assistance Sidelying to sit: Max assist;+2 for physical assistance   Sit to supine: Total assist;+2 for physical assistance   General bed mobility comments: Max cues and positioned extremities in positions to assist (legs flexed to roll); required max to total assist  Transfers Overall transfer level: Needs assistance Equipment used: 2 person hand held assist Transfers: Sit to/from Stand Sit to Stand: Total assist;From elevated surface;+2 physical assistance         General transfer comment: Performed 3 parital stands with bil knees block, use of gait belt and bed pad to faciliate, and provided support at shoulders  Ambulation/Gait              General Gait Details: unable at this time   Stairs             Wheelchair Mobility    Modified Rankin (Stroke Patients Only) Modified Rankin (Stroke Patients Only) Pre-Morbid Rankin Score: Slight disability Modified Rankin: Severe disability     Balance Overall balance assessment: Needs assistance Sitting-balance support: Bilateral upper extremity supported;No upper extremity supported;Feet supported;Single extremity supported Sitting balance-Leahy Scale: Poor Sitting balance - Comments: Initially requiring mod A; able to position pt either on R elbow or hands on knees and could hold position with supervision for 30 sec to 1 minutes with multiple reps; tending to lean R provided tactile facilitation, verbal, and visual cues to correct - pt did try to correct twice without assistance Postural control: Posterior lean;Right lateral lean   Standing balance-Leahy Scale: Zero Standing balance comment: dependent on physical assist                            Cognition Arousal/Alertness: Lethargic (family reports just received Keppra that makes her lethargic) Behavior During Therapy: Flat affect Overall Cognitive Status: Impaired/Different from baseline Area of Impairment: Attention;Memory;Following commands;Safety/judgement;Awareness;Problem solving;Orientation                 Orientation Level: Disoriented to;Place;Time;Situation   Memory: Decreased recall of precautions;Decreased short-term memory Following Commands: Follows one step commands inconsistently Safety/Judgement: Decreased awareness of safety;Decreased awareness of deficits   Problem Solving: Decreased initiation;Difficulty sequencing;Slow processing;Requires verbal cues;Requires tactile cues General Comments: Pt with minimal communication, occasionally states "uh-hu"; family was present  Exercises General Exercises - Lower Extremity Ankle Circles/Pumps: 5  reps;Seated;AAROM;Left;PROM;Right Long Arc Quad: 5 reps;Seated;AAROM;Left;PROM;Right Other Exercises Other Exercises: bil knee flexion x 5 in sitting AAROM on L; PROM on R Other Exercises: Ankle PF stretch on R 30 sec; additionally provided downward pressure through knee in seated position to get foot flat    General Comments General comments (skin integrity, edema, etc.): VSS      Pertinent Vitals/Pain Pain Assessment: No/denies pain    Home Living                      Prior Function            PT Goals (current goals can now be found in the care plan section) Acute Rehab PT Goals Patient Stated Goal: pt unable to state PT Goal Formulation: Patient unable to participate in goal setting Time For Goal Achievement: 08/14/20 Potential to Achieve Goals: Fair Progress towards PT goals: Progressing toward goals    Frequency    Min 4X/week      PT Plan Current plan remains appropriate    Co-evaluation PT/OT/SLP Co-Evaluation/Treatment: Yes Reason for Co-Treatment: Complexity of the patient's impairments (multi-system involvement);For patient/therapist safety PT goals addressed during session: Mobility/safety with mobility;Balance OT goals addressed during session: ADL's and self-care;Strengthening/ROM      AM-PAC PT "6 Clicks" Mobility   Outcome Measure  Help needed turning from your back to your side while in a flat bed without using bedrails?: Total Help needed moving from lying on your back to sitting on the side of a flat bed without using bedrails?: Total Help needed moving to and from a bed to a chair (including a wheelchair)?: Total Help needed standing up from a chair using your arms (e.g., wheelchair or bedside chair)?: Total Help needed to walk in hospital room?: Total Help needed climbing 3-5 steps with a railing? : Total 6 Click Score: 6    End of Session Equipment Utilized During Treatment: Gait belt Activity Tolerance: Patient tolerated  treatment well Patient left: in bed;with call bell/phone within reach;with bed alarm set;with family/visitor present Nurse Communication: Mobility status;Need for lift equipment PT Visit Diagnosis: Unsteadiness on feet (R26.81);Difficulty in walking, not elsewhere classified (R26.2)     Time: 5427-0623 PT Time Calculation (min) (ACUTE ONLY): 30 min  Charges:  $Neuromuscular Re-education: 8-22 mins                     Abran Richard, PT Acute Rehab Services Pager [phone] [facility] 07/26/2020, 12:33 PM

## 2020-07-26 NOTE — Progress Notes (Signed)
PMR Admission Coordinator Pre-Admission Assessment   Patient: Lori Bridges is an 86 y.o., female MRN: [id number] DOB: [birth year] Height:   Weight: 69.4 kg                                                                                                                                                  Insurance Information HMO:     PPO:      PCP:      IPA:      80/20:      OTHER:  PRIMARY: Medicare A and B      Policy#: [id number]      Subscriber: pt CM Name:       Phone#:      Fax#:  Pre-Cert#: verified Civil engineer, contracting:  Benefits:  Phone #:      Name:  Eff. Date: 06/28/97 A and B     Deduct: $1484      Out of Pocket Max: n/a      Life Max: n/a  CIR: 100%      SNF: 20 full days Outpatient: 80%     Co-Pay: 20% Home Health: 100%      Co-Pay:  DME: 80%     Co-Pay: 20% Providers: pt choice  SECONDARY: BCBS Supplement      Policy#: [id number]      Phone#:    Financial Counselor:       Phone#:    The Engineer, petroleum" for patients in Inpatient Rehabilitation Facilities with attached "Privacy Act [place] Records" was provided and verbally reviewed with: Patient and Family   Emergency Contact Information Contact Information     Name Relation Home Work Mobile    [place] Daughter [phone]           Current Medical History  Patient Admitting Diagnosis: ICH    History of Present Illness: Lori Bridges is a 86 year old right-handed female history of atrial fibrillation maintained on Coumadin, hypertension, hyperlipidemia, chronic kidney disease.  Presented July 23, 2020 with increasing confusion as well as slurred speech and subtle hand and forearm twitching..  CT/MRI showed acute intraparenchymal hemorrhage in the left vertex 2.2 x 1.7 x 2.4 cm in size with surrounding vasogenic edema.  Additional intraparenchymal hemorrhage in the left temporal lobe 11 x 6 x 10 mm.  No midline shift or hydrocephalus.  CT angiogram of head no evidence of  enhancing mass or lesion.  Admission chemistries with potassium 3.2, glucose 126, creatinine 1.22 alcohol negative SARS coronavirus negative, INR 1.5.  She was loaded with Keppra and EEG negative for seizure.  Patient did receive Kcentra/vitamin K to reverse Coumadin.  Cardene drip initially for blood pressure controlled.  She was cleared to begin subcutaneous heparin for DVT prophylaxis July 25, 2020.  She remains n.p.o. with alternative means of nutritional  support.  Patient with findings of E. coli urinary tract infection completing course of Rocephin.  Therapy evaluations completed and patient was admitted for a comprehensive rehab program.   Complete NIHSS TOTAL: 16   Past Medical History      Past Medical History:  Diagnosis Date  . Adenomatous colon polyp 2005  . Anxiety age 61  . Carotid artery bruit 2001    left  . CKD (chronic kidney disease), stage III    . DDD (degenerative disc disease)    . DJD (degenerative joint disease)    . Gastroenteritis 2011  . GERD (gastroesophageal reflux disease) 2003  . History of pneumonia    . HTN (hypertension) 1983  . Hyperlipidemia 2000  . Hypothyroidism 2009  . Iron deficiency anemia 1983  . Osteoarthritis 2001  . Osteoporosis 2001  . Permanent atrial fibrillation ([place]) 07/23/2010        . Persistent headaches 1987  . Pulmonary HTN ([place]) 04/03/2017    Moderate with PASP 90mmHg by echo 02/2017  . Tricuspid regurgitation      severe by echo 02/25/12  . Vertigo    . Vitamin D deficiency 2011      Family History  family history includes Diabetes in her mother; Heart disease (age of onset: 71) in her brother; Hypertension in her father; Liver cancer in her sister; Peripheral vascular disease in her father.   Prior Rehab/Hospitalizations:  Has the patient had prior rehab or hospitalizations prior to admission? No   Has the patient had major surgery during 100 days prior to admission? No   Current Medications    Current  Facility-Administered Medications:  .   stroke: mapping our early stages of recovery book, , Does not apply, Once, Donnetta Simpers, MD .  0.9 %  sodium chloride infusion, , Intravenous, Continuous, Rosalin Hawking, MD, Last Rate: 10 mL/hr at 07/26/20 0500, Rate Verify at 07/26/20 0500 .  acetaminophen (TYLENOL) tablet 650 mg, 650 mg, Oral, Q4H PRN **OR** acetaminophen (TYLENOL) 160 MG/5ML solution 650 mg, 650 mg, Per Tube, Q4H PRN **OR** acetaminophen (TYLENOL) suppository 650 mg, 650 mg, Rectal, Q4H PRN, Donnetta Simpers, MD .  cefTRIAXone (ROCEPHIN) 1 g in sodium chloride 0.9 % 100 mL IVPB, 1 g, Intravenous, Q24H, Rosalin Hawking, MD, Stopped at 07/25/20 1554 .  Chlorhexidine Gluconate Cloth 2 % PADS 6 each, 6 each, Topical, Daily, Rosalin Hawking, MD, 6 each at 07/26/20 1303 .  feeding supplement (OSMOLITE 1.2 CAL) liquid 1,000 mL, 1,000 mL, Per Tube, Continuous, Rosalin Hawking, MD, Last Rate: 55 mL/hr at 07/26/20 1258, 1,000 mL at 07/26/20 1258 .  feeding supplement (PROSource TF) liquid 45 mL, 45 mL, Per Tube, BID, Rosalin Hawking, MD, 45 mL at 07/26/20 1021 .  free water 150 mL, 150 mL, Per Tube, Q4H, Rosalin Hawking, MD, 150 mL at 07/26/20 1347 .  heparin injection 5,000 Units, 5,000 Units, Subcutaneous, Q12H, Rosalin Hawking, MD, 5,000 Units at 07/26/20 1040 .  labetalol (NORMODYNE) injection 5-20 mg, 5-20 mg, Intravenous, Q2H PRN, Rosalin Hawking, MD .  levETIRAcetam (KEPPRA) 100 MG/ML solution 1,000 mg, 1,000 mg, Per Tube, BID, Amie [place], MD, 1,000 mg at 07/26/20 1029 .  levothyroxine (SYNTHROID) tablet 50 mcg, 50 mcg, Per Tube, QAC breakfast, Rosalin Hawking, MD, 50 mcg at 07/26/20 0519 .  LORazepam (ATIVAN) tablet 0.5 mg, 0.5 mg, Per Tube, QHS, Rosalin Hawking, MD, 0.5 mg at 07/25/20 2123 .  MEDLINE mouth rinse, 15 mL, Mouth Rinse, BID, Rosalin Hawking, MD, 15 mL at 07/26/20 1000 .  metoprolol tartrate (LOPRESSOR) tablet 75 mg, 75 mg, Per Tube, BID, Rosalin Hawking, MD .  pantoprazole sodium (PROTONIX) 40 mg/20 mL oral  suspension 40 mg, 40 mg, Per Tube, Daily, Rosalin Hawking, MD, 40 mg at 07/26/20 1020 .  senna-docusate (Senokot-S) tablet 1 tablet, 1 tablet, Per Tube, BID, Rosalin Hawking, MD, 1 tablet at 07/25/20 2132   Patients Current Diet:     Diet Order                      Diet NPO time specified  Diet effective now                      Precautions / Restrictions Precautions Precautions: Fall Precaution Comments: [place] Restrictions Weight Bearing Restrictions: No    Has the patient had 2 or more falls or a fall with injury in the past year?Yes   Prior Activity Level Limited Community (1-2x/wk): ambulated with cane prior to admission, not driving, but daughter was taking her to appointments, getting her hair done, running errands, etc.    Prior Functional Level Prior Function Level of Independence: Independent with assistive device(s) Comments: per chart, was walking with RW, was able dress and bath self, per chart per daughter states her memory has started declining and she has started managing pt's finances   Self Care: Did the patient need help bathing, dressing, using the toilet or eating?  Independent   Indoor Mobility: Did the patient need assistance with walking from room to room (with or without device)? Independent   Stairs: Did the patient need assistance with internal or external stairs (with or without device)? Independent   Functional Cognition: Did the patient need help planning regular tasks such as shopping or remembering to take medications? Independent   Home Assistive Devices / Equipment   Prior Device Use: Indicate devices/aids used by the patient prior to current illness, exacerbation or injury? cane   Current Functional Level Cognition   Arousal/Alertness: Awake/alert Overall Cognitive Status: Impaired/Different from baseline Current Attention Level: Focused Orientation Level: Oriented to person Following Commands: Follows one step commands  inconsistently Safety/Judgement: Decreased awareness of safety, Decreased awareness of deficits General Comments: Pt with minimal communication, occasionally states "uh-hu"; family was present Awareness: Impaired Awareness Impairment: Emergent impairment Problem Solving: Impaired Problem Solving Impairment: Functional basic    Extremity Assessment (includes Sensation/Coordination)   Upper Extremity Assessment: Defer to OT evaluation  Lower Extremity Assessment: RLE deficits/detail, LLE deficits/detail RLE Deficits / Details: hold R LE in hip flexion and external rotation with foot next to L knee (figure 4 position) pt appears to have incresaed flexor tone, does move it actively however not as much as L LLE Deficits / Details: delayed processing and squencing     ADLs   Overall ADL's : Needs assistance/impaired Grooming: Maximal assistance Lower Body Dressing: Maximal assistance, Sitting/lateral leans Lower Body Dressing Details (indicate cue type and reason): pt using bil Ue to open sock but unable to loop foot into the sock.pt was able to figure 4 R LE but needs (A) to position L LE. pt was unable to continue to pull up L sock. Pt was able to continue sequence on R LE.  General ADL Comments: pt requires (A) for static sitting balance and then progressed to LB dressing task. PT continued to move R sid einto flexion patterns throughout session. pt was abl to move out of them but holding them in that position at rest  Mobility   Overal bed mobility: Needs Assistance Bed Mobility: Rolling, Sidelying to Sit, Sit to Supine Rolling: Total assist, +2 for physical assistance Sidelying to sit: Max assist, +2 for physical assistance Supine to sit: Total assist Sit to supine: Total assist, +2 for physical assistance General bed mobility comments: Max cues and positioned extremities in positions to assist (legs flexed to roll); required max to total assist     Transfers   Overall transfer  level: Needs assistance Equipment used: 2 person hand held assist Transfers: Sit to/from Stand Sit to Stand: Total assist, From elevated surface, +2 physical assistance Stand pivot transfers: Max assist, +2 physical assistance General transfer comment: Performed 3 parital stands with bil knees block, use of gait belt and bed pad to faciliate, and provided support at shoulders     Ambulation / Gait / Stairs / Wheelchair Mobility   Ambulation/Gait General Gait Details: unable at this time     Posture / Balance Dynamic Sitting Balance Sitting balance - Comments: Initially requiring mod A; able to position pt either on R elbow or hands on knees and could hold position with supervision for 30 sec to 1 minutes with multiple reps; tending to lean R provided tactile facilitation, verbal, and visual cues to correct - pt did try to correct twice without assistance Balance Overall balance assessment: Needs assistance Sitting-balance support: Bilateral upper extremity supported, No upper extremity supported, Feet supported, Single extremity supported Sitting balance-Leahy Scale: Poor Sitting balance - Comments: Initially requiring mod A; able to position pt either on R elbow or hands on knees and could hold position with supervision for 30 sec to 1 minutes with multiple reps; tending to lean R provided tactile facilitation, verbal, and visual cues to correct - pt did try to correct twice without assistance Postural control: Posterior lean, Right lateral lean Standing balance support: Bilateral upper extremity supported Standing balance-Leahy Scale: Zero Standing balance comment: dependent on physical assist     Special needs/care consideration Oxygen 2L in hospital, not at home, Special service needs potential decrease BOC and Designated visitor daughters, Mauri [place] and Terry (from acute therapy documentation) Living Arrangements:  (grandson) Available Help  at Discharge: Family, Available 24 hours/day Type of Home: [place]: No Additional Comments: pt poor historian, info provided by chart   Discharge Living Setting Plans for Discharge Living Setting: Lives with (comment) (daughter) Type of Home at Discharge: House Discharge Home Layout: One level Discharge Home Access: Stairs to enter Entrance Stairs-Rails: Right, Left (cannot reach both) Entrance Stairs-Number of Steps: 4 Discharge Bathroom Shower/Tub: Tub/shower unit Discharge Bathroom Toilet: Standard Discharge Bathroom Accessibility: Yes How Accessible: Accessible via walker Does the patient have any problems obtaining your medications?: No   Social/Family/Support Systems Anticipated Caregiver: Mauri [place] (primary contact), seven children, grand children, and other family members Anticipated Caregiver's Contact Information: [name] [phone] Ability/Limitations of Caregiver: mod assist Caregiver Availability: 24/7 Discharge Plan Discussed with Primary Caregiver: Yes Is Caregiver In Agreement with Plan?: Yes Does Caregiver/Family have Issues with Lodging/Transportation while Pt is in Rehab?: No     Goals Patient/Family Goal for Rehab: PT/OT/SLP min to mod assist Expected length of stay: 28-32 days Additional Information: Pt's family not interested in SNF at this time; however, pt would be appropriate for decrease BOC to SNF if they would be open to it.  Pt/Family Agrees to Admission and willing to participate: Yes Program Orientation Provided & Reviewed with Pt/Caregiver  Including Roles  & Responsibilities: Yes     Decrease burden of Care through IP rehab admission: Specialzed equipment needs, Diet advancement, Decrease number of caregivers and Patient/family education     Possible need for SNF placement upon discharge: Potentially      Patient Condition: This patient's condition remains as documented in the consult dated 07/24/20, in which the  Rehabilitation Physician determined and documented that the patient's condition is appropriate for intensive rehabilitative care in an inpatient rehabilitation facility. Will admit to inpatient rehab today.   Preadmission Screen Completed By:  Michel Santee, PT, DPT 07/26/2020 1:56 PM ______________________________________________________________________   Discussed status with Dr. Ranell Patrick on 07/26/20 at 2:00 PM  and received approval for admission today.   Admission Coordinator:  Michel Santee, PT, DPT time 2:00 PM Sudie Grumbling 07/26/20              Cosigned by: Izora Ribas, MD at 07/26/2020  2:11 PM

## 2020-07-26 NOTE — Progress Notes (Signed)
Patient arrived from [place], [facility] in a bed. Patient on O2 via  at 2L . Tube feeding running at 14ml/hr. All lines appear patent. Pt assigned to [phone]

## 2020-07-26 NOTE — Progress Notes (Signed)
Physical Medicine and Rehabilitation Consult Reason for Consult: Decreased functional mobility with slurred speech Referring Physician: Dr.Xu     HPI: Lori Bridges is a 86 y.o. right-handed female with history of atrial fibrillation maintained on Coumadin, hypertension, hyperlipidemia, chronic kidney disease.  Per chart review patient lives with her grandson.  Independent with assistive device and was able to dress and bathe herself although family notes some recent decrease in memory.  She has 7 children in the area that check on her routinely.  Presented 07/23/2020 with increasing confusion and slurred speech as well as questionable seizure.  CT/MRI showed acute intraparenchymal hemorrhage in the left vertex 2.2 x 1.7 x 2.4 cm in size with surrounding vasogenic edema.  Additional intraparenchymal hemorrhage in the left temporal lobe 11 x 6 x 10 mm.  No midline shift or hydrocephalus.  Admission chemistries alcohol negative, SARS coronavirus negative, potassium 3.2, glucose 126, creatinine 1.22 INR 1.5 Patient was loaded with Keppra with EEG pending.  Cardene drip for blood pressure control.  Therapy evaluations completed with recommendations of physical medicine rehab consult.     Review of Systems  Constitutional: Negative for chills and fever.  HENT: Negative for hearing loss.   Eyes: Negative for blurred vision and double vision.  Respiratory: Negative for shortness of breath.   Cardiovascular: Negative for chest pain.  Gastrointestinal: Positive for constipation. Negative for heartburn and nausea.       GERD  Genitourinary: Negative for dysuria, flank pain and hematuria.  Musculoskeletal: Positive for myalgias.  Skin: Negative for rash.  Neurological: Positive for speech change.  Psychiatric/Behavioral: Positive for memory loss.       Anxiety  All other systems reviewed and are negative.       Past Medical History:  Diagnosis Date  . Adenomatous colon polyp 2005  .  Anxiety age 57  . Carotid artery bruit 2001    left  . CKD (chronic kidney disease), stage III    . DDD (degenerative disc disease)    . DJD (degenerative joint disease)    . Gastroenteritis 2011  . GERD (gastroesophageal reflux disease) 2003  . History of pneumonia    . HTN (hypertension) 1983  . Hyperlipidemia 2000  . Hypothyroidism 2009  . Iron deficiency anemia 1983  . Osteoarthritis 2001  . Osteoporosis 2001  . Permanent atrial fibrillation ([place]) 07/23/2010        . Persistent headaches 1987  . Pulmonary HTN ([place]) 04/03/2017    Moderate with PASP 68mmHg by echo 02/2017  . Tricuspid regurgitation      severe by echo 02/25/12  . Vertigo    . Vitamin D deficiency 2011         Past Surgical History:  Procedure Laterality Date  . COLONOSCOPY W/ BIOPSIES AND POLYPECTOMY   09/16/2004    adenomatous polyp, diverticulosis, internal hemorrhoids  . s/p BLT      . TUBAL LIGATION             Family History  Problem Relation Age of Onset  . Hypertension Father    . Peripheral vascular disease Father    . Diabetes Mother    . Heart disease Brother 36  . Liver cancer Sister      Social History:  reports that she has never smoked. She has never used smokeless tobacco. She reports that she does not drink alcohol and does not use drugs. Allergies:  Allergies  Allergen Reactions  . Doxycycline Other (See Comments)      UNKNOWN unknown  . Ezetimibe Other (See Comments)      Arthralgia, Myalgias  . Meclizine Other (See Comments) and Palpitations      Tachycardia  . Pravastatin Other (See Comments)      Arthralgia, Myalgias  . Colestipol Other (See Comments)  . Sulfamethoxazole Other (See Comments)      unknown  . Ace Inhibitors Cough  . Codeine Nausea Only  . Colestipol Hcl Other (See Comments)      Malaise  . Lovastatin Other (See Comments)      UNKNOWN Unknown  . Sulfa Antibiotics Other (See Comments)      unknown          Medications Prior to Admission    Medication Sig Dispense Refill  . Cholecalciferol (VITAMIN D3) 2000 units TABS Take 2,000 Units by mouth daily.      . Cyanocobalamin (VITAMIN B-12 PO) Take 1 tablet by mouth daily.      . furosemide (LASIX) 20 MG tablet Take 1 tablet (20 mg total) by mouth daily. 90 tablet 3  . hydrALAZINE (APRESOLINE) 25 MG tablet Take 1 tablet (25 mg total) by mouth in the morning and at bedtime. (Patient taking differently: Take 25 mg by mouth daily. ) 60 tablet 11  . irbesartan (AVAPRO) 150 MG tablet Take 1 tablet (150 mg total) by mouth daily. 30 tablet 11  . LORazepam (ATIVAN) 1 MG tablet Take 0.5 mg by mouth at bedtime.       . metoprolol succinate (TOPROL XL) 100 MG 24 hr tablet TAKE 1 TABLET(100 MG) BY MOUTH DAILY IN THE MORNING. (Patient taking differently: Take 100 mg by mouth daily. ) 90 tablet 3  . metoprolol succinate (TOPROL XL) 25 MG 24 hr tablet Take 2 tablets (50 mg total) by mouth at bedtime. 180 tablet 3  . pantoprazole (PROTONIX) 40 MG tablet Take 40 mg by mouth daily.       Marland Kitchen SYNTHROID 50 MCG tablet Take 50 mcg by mouth daily before breakfast.    8  . warfarin (COUMADIN) 2 MG tablet Take 1 tablet (2 mg total) by mouth daily. Or as directed by the coumadin clinic (Patient taking differently: Take 2-3 mg by mouth See admin instructions. Take 1 and 1/2 tablet on Sunday then take 1 tablet all the other days) 35 tablet 2      Home: [place] expects to be discharged to:: Private residence Living Arrangements:  (grandson) Available Help at Discharge: Family, Available 24 hours/day Type of Home: House Additional Comments: pt poor historian, info provided by chart  Functional History: Prior Function Level of Independence: Independent with assistive device(s) Comments: per chart, was walking with RW, was able dress and bath self, per chart per daughter states her memory has started declining and she has started managing pt's finances Functional Status:  Mobility: Bed  Mobility Overal bed mobility: Needs Assistance Bed Mobility: Rolling, Sidelying to Sit Rolling: Mod assist, Max assist Sidelying to sit: Mod assist, Max assist General bed mobility comments: max directional verbal cues, maxA for LE management off bed, modA for trunk elevation, pt assisted with transfer once initiated  Transfers Overall transfer level: Needs assistance Equipment used:  (2 person lift with gait belt) Transfers: Stand Pivot Transfers, Sit to/from Stand Sit to Stand: Max assist, +2 physical assistance Stand pivot transfers: Max assist, +2 physical assistance General transfer comment: R knee blocked, dependence for R  LE advancement, pt unable to sequence stepping, maxAx2 to maintain upright posture Ambulation/Gait General Gait Details: unable at this time   ADL:   Cognition: Cognition Overall Cognitive Status: Impaired/Different from baseline (assessment also limited by language deficits) Arousal/Alertness: Awake/alert Orientation Level:  (difficulty stating) Awareness: Impaired Awareness Impairment: Emergent impairment Problem Solving: Impaired Problem Solving Impairment: Functional basic Cognition Arousal/Alertness: Awake/alert Behavior During Therapy: Flat affect Overall Cognitive Status: Impaired/Different from baseline (assessment also limited by language deficits) Area of Impairment: Orientation, Attention, Memory, Following commands, Safety/judgement, Awareness, Problem solving Orientation Level: Disoriented to, Place, Time, Situation Current Attention Level: Focused Memory: Decreased short-term memory Following Commands: Follows one step commands with increased time, Follows one step commands inconsistently (simple commands about 75% of time) Safety/Judgement: Decreased awareness of deficits Awareness: Intellectual Problem Solving: Decreased initiation, Difficulty sequencing, Slow processing, Requires verbal cues, Requires tactile cues General Comments: pt  with severe word finding difficulty, despite incresaed tone in R UE and LE pt more apt to follow commands with R UE versus L UE. With tactile cues pt will follow commands with L UE   Blood pressure (!) 116/62, pulse (!) 33, temperature (!) 97.4 F (36.3 C), temperature source Axillary, resp. rate 17, weight 69.4 kg, SpO2 97 %. Physical Exam   General: No apparent distress HEENT: Head is normocephalic, atraumatic, eyes mostly closed, when open has palsy of R lateral rectus and left medial rectus. Neck: Supple without JVD or lymphadenopathy Heart: Irregularly irregular rate and rhythm. No murmurs rubs or gallops Chest: CTA bilaterally without wheezes, rales, or rhonchi; no distress Abdomen: Soft, non-tender, non-distended, bowel sounds positive. Extremities: No clubbing, cyanosis, or edema. Pulses are 2+ Skin: Clean and intact without signs of breakdown Neuro: Patient is awake and alert, but eyes are mostly closed.  She does make eye contact with examiner.  Patient is aphasic but was able to state her name.  Most of the words were mumbling- expressive aphasia. Inconsistent to follow commands. Unable to engage in MMT  Psych: Pt's affect is appropriate. Pt is cooperative  Lab Results Last 24 Hours        Results for orders placed or performed during the hospital encounter of 07/22/20 (from the past 24 hour(s))  Protime-INR     Status: Abnormal    Collection Time: 07/23/20  2:03 PM  Result Value Ref Range    Prothrombin Time 16.1 (H) 11.4 - 15.2 seconds    INR 1.3 (H) 0.8 - 1.2  MRSA PCR Screening     Status: None    Collection Time: 07/23/20  3:06 PM    Specimen: Nasal Mucosa; Nasopharyngeal  Result Value Ref Range    MRSA by PCR NEGATIVE NEGATIVE  No blood products     Status: None    Collection Time: 07/23/20  6:29 PM  Result Value Ref Range    Transfuse no blood products          TRANSFUSE NO BLOOD PRODUCTS, VERIFIED BY Holland Commons, RN Performed at [facility], [address], [place], [place] [address]    Protime-INR     Status: Abnormal    Collection Time: 07/23/20  9:14 PM  Result Value Ref Range    Prothrombin Time 16.7 (H) 11.4 - 15.2 seconds    INR 1.4 (H) 0.8 - 1.2  CBC     Status: Abnormal    Collection Time: 07/24/20  4:07 AM  Result Value Ref Range    WBC 3.9 (L) 4.0 - 10.5 K/uL  RBC 4.67 3.87 - 5.11 MIL/uL    Hemoglobin 13.8 12.0 - 15.0 g/dL    HCT 43.2 36 - 46 %    MCV 92.5 80.0 - 100.0 fL    MCH 29.6 26.0 - 34.0 pg    MCHC 31.9 30.0 - 36.0 g/dL    RDW 18.3 (H) 11.5 - 15.5 %    Platelets 148 (L) 150 - 400 K/uL    nRBC 0.0 0.0 - 0.2 %  Basic metabolic panel     Status: Abnormal    Collection Time: 07/24/20  4:07 AM  Result Value Ref Range    Sodium 143 135 - 145 mmol/L    Potassium 3.4 (L) 3.5 - 5.1 mmol/L    Chloride 111 98 - 111 mmol/L    CO2 21 (L) 22 - 32 mmol/L    Glucose, Bld 111 (H) 70 - 99 mg/dL    BUN 20 8 - 23 mg/dL    Creatinine, Ser 1.28 (H) 0.44 - 1.00 mg/dL    Calcium 9.8 8.9 - 10.3 mg/dL    GFR calc non Af Amer 37 (L) >60 mL/min    GFR calc Af Amer 43 (L) >60 mL/min    Anion gap 11 5 - 15  Protime-INR     Status: Abnormal    Collection Time: 07/24/20  4:07 AM  Result Value Ref Range    Prothrombin Time 16.9 (H) 11.4 - 15.2 seconds    INR 1.4 (H) 0.8 - 1.2  Urinalysis, Routine w reflex microscopic (not at [facility])     Status: Abnormal    Collection Time: 07/24/20 11:51 AM  Result Value Ref Range    Color, Urine AMBER (A) YELLOW    APPearance HAZY (A) CLEAR    Specific Gravity, Urine 1.035 (H) 1.005 - 1.030    pH 5.0 5.0 - 8.0    Glucose, UA NEGATIVE NEGATIVE mg/dL    Hgb urine dipstick LARGE (A) NEGATIVE    Bilirubin Urine NEGATIVE NEGATIVE    Ketones, ur NEGATIVE NEGATIVE mg/dL    Protein, ur 100 (A) NEGATIVE mg/dL    Nitrite NEGATIVE NEGATIVE    Leukocytes,Ua NEGATIVE NEGATIVE    RBC / HPF >50 (H) 0 - 5 RBC/hpf    WBC, UA 6-10 0 - 5 WBC/hpf    Bacteria, UA MANY (A) NONE SEEN    Mucus PRESENT      Hyaline  Casts, UA PRESENT    BLOOD TRANSFUSION REPORT - SCANNED     Status: None    Collection Time: 07/24/20 12:08 PM    Narrative    Ordered by an unspecified provider.       Imaging Results (Last 48 hours)  CT ANGIO HEAD W OR WO CONTRAST   Result Date: 07/23/2020 CLINICAL DATA:  Follow-up hemorrhage EXAM: CT ANGIOGRAPHY HEAD TECHNIQUE: Multidetector CT imaging of the head was performed using the standard protocol during bolus administration of intravenous contrast. Multiplanar CT image reconstructions and MIPs were obtained to evaluate the vascular anatomy. CONTRAST:  72mL OMNIPAQUE IOHEXOL 350 MG/ML SOLN COMPARISON:  07/22/2020 FINDINGS: CT HEAD Brain: Slight interval contraction of the hematoma in the medial left parietal lobe, measuring 23 x 24 x 18 mm compared with 25 x 23 x 19 mm yesterday. Amount of adjacent vasogenic edema appears similar. Small amount of adjacent subarachnoid blood is slightly reduced. No new brain abnormality. Atrophy and chronic small-vessel ischemic change of the white matter elsewhere. No hydrocephalus. Vascular: There is atherosclerotic calcification of the major  vessels at the base of the brain. Skull: Negative Sinuses: Clear Orbits: Normal CTA HEAD Anterior circulation: Both internal carotid arteries are patent through the skull base and siphon regions. Ordinary siphon atherosclerotic calcification with maximal stenosis estimated at 30%. The anterior and middle cerebral vessels are patent. Both anterior cerebral is receive most of there supply from the left carotid circulation. Flow is present in the anterior and middle cerebral arteries. No sign of vascular malformation in the region of the left parietal hemorrhage. Additional intraparenchymal hemorrhage in the inferolateral left temporal lobe measuring maximally 11 mm. Minimal surrounding edema. No sign of enhancement or abnormal vessels in that region. Posterior circulation: Both vertebral arteries are patent to the basilar.  No basilar stenosis. Posterior circulation branch vessels show flow. Venous sinuses: Early venous phase imaging does not allow accurate venography. I do not see any finding suggesting venous thrombosis, but this study does not represent a true CT venogram. Anatomic variants: None significant. IMPRESSION: Slight contraction of the left parietal hematoma and adjacent subarachnoid blood. No increase in edema or mass effect. No evidence of enhancing mass or high flow vascular malformation. Diminishing density of a hemorrhage in the left inferolateral temporal lobe, maximal dimension 11 mm. Very small amount of adjacent subarachnoid blood. No enhancement in this region. No new brain finding. Study does not serve as a true CT venogram. No positive evidence of venous thrombosis. This pattern of disease suggests that these may be post traumatic hemorrhages. Electronically Signed   By: Nelson Chimes M.D.   On: 07/23/2020 14:11    CT HEAD WO CONTRAST   Result Date: 07/22/2020 CLINICAL DATA:  Found at 1340 hours by daughter leaning against wall between the bed and a dresser, aphasic, question stroke; history pulmonary hypertension, atrial fibrillation, hypertension, stage III chronic kidney disease, CHF EXAM: CT HEAD WITHOUT CONTRAST TECHNIQUE: Contiguous axial images were obtained from the base of the skull through the vertex without intravenous contrast. Sagittal and coronal MPR images reconstructed from axial data set. COMPARISON:  None FINDINGS: Brain: Generalized atrophy. Normal ventricular morphology. High attenuation acute intraparenchymal hemorrhage identified at LEFT vertex 2.2 x 1.7 x 2.4 cm in size with surrounding vasogenic edema. Extra-axial extension of hemorrhage along the falx and medial sulci to LEFT vertex. No midline shift. Additional intraparenchymal hemorrhage at LEFT temporal lobe 11 x 6 x 10 mm. These could be due to hemorrhagic metastases, less likely hemorrhagic infarct. No additional infarcts,  mass, or hemorrhage identified. Vascular: No hyperdense vessels Skull: Intact Sinuses/Orbits: Clear Other: N/A IMPRESSION: Acute intraparenchymal hemorrhage at LEFT vertex 2.2 x 1.7 x 2.4 cm in size with surrounding vasogenic edema. Extra-axial extension of hemorrhage along the falx and interhemispheric fissure. Additional intraparenchymal hemorrhage at LEFT temporal lobe 11 x 6 x 10 mm. Findings are concerning for hemorrhagic metastases and further assessment by MR imaging with and without contrast recommended. No midline shift or hydrocephalus. Critical Value/emergent results were called by telephone at the time of interpretation on 07/22/2020 at 5:28 pm to provider [place] , who verbally acknowledged these results. Electronically Signed   By: Lavonia Dana M.D.   On: 07/22/2020 17:30    MR BRAIN W WO CONTRAST   Result Date: 07/22/2020 CLINICAL DATA:  Multifocal intracranial hemorrhage EXAM: MRI HEAD WITHOUT AND WITH CONTRAST TECHNIQUE: Multiplanar, multiecho pulse sequences of the brain and surrounding structures were obtained without and with intravenous contrast. CONTRAST:  4mL GADAVIST GADOBUTROL 1 MMOL/ML IV SOLN COMPARISON:  Head CT 07/22/2020 Brain MRI 04/30/2012 FINDINGS:  Brain: Unchanged intraparenchymal hemorrhage in the left parietal lobe and left temporal lobe. Mild edema at both locations, greater in the parietal lobe. No acute ischemia. No midline shift or other mass effect. Multifocal white matter hyperintensity, most commonly due to chronic ischemic microangiopathy. No hydrocephalus. There is a small amount of subarachnoid blood over the left temporal convexity. No chronic microhemorrhage. Normal midline structures. Postcontrast images are motion degraded there is no appreciable contrast enhancement at the sites of hemorrhage. Vascular: Normal flow voids. Skull and upper cervical spine: Normal marrow signal. Sinuses/Orbits: Negative. Other: None. IMPRESSION: 1. Unchanged intraparenchymal  hemorrhage in the left parietal lobe and left temporal lobe with mild edema. 2. No abnormal contrast enhancement at either hemorrhage site to indicate the presence of a mass. Electronically Signed   By: Ulyses Jarred M.D.   On: 07/22/2020 21:49    EEG adult   Result Date: 07/22/2020 Lora Havens, MD     07/23/2020  9:06 AM Patient Name: CABRINA SHIROMA MRN: [id number] Epilepsy Attending: Lora Havens Referring Physician/Provider: Dr Donnetta Simpers Date: 07/22/2020 Duration: 24.02 mins Patient history: 86 y/o F on Warfarin p/w L frontomedial ICH and another smaller L temporooccipital ICH with associated vasogenic edema. On exam noted to have R leg and arm twitching, Unable to abduct R eye and adduct Left eye. EEG to evaluate for seizure Level of alertness: lethargic AEDs during EEG study: Keppra, ativan Technical aspects: This EEG study was done with scalp electrodes positioned according to the 10-20 International system of electrode placement. Electrical activity was acquired at a sampling rate of 500Hz  and reviewed with a high frequency filter of 70Hz  and a low frequency filter of 1Hz . EEG data were recorded continuously and digitally stored. Description: No posterior dominant rhythm was seen. EEG showed continuous generalized 3 to 6 Hz theta-delta slowing admixed with15 to 18 Hz beta activity with irregular morphology distributed symmetrically and diffusely.  Hyperventilation and photic stimulation were not performed.   ABNORMALITY -Excessive beta, generalized -Continuous slow, generalized IMPRESSION: This study is suggestive of moderate diffuse encephalopathy, nonspecific etiology but likely related to sedation, post-ictal state. The excessive beta activity seen in the background is most likely due to the effect of benzodiazepine and is a benign EEG pattern. No seizures or definite epileptiform discharges were seen throughout the recording. Priyanka [name]    Overnight EEG with video   Result  Date: 07/23/2020 Lora Havens, MD     07/23/2020  9:41 AM Patient Name: BLAKELY MARANAN MRN: [id number] Epilepsy Attending: Lora Havens Referring Physician/Provider: Dr Donnetta Simpers Duration: 07/22/2020 2303 to 07/23/2020 0930   Patient history: 86 y/o F on Warfarin p/w L frontomedial ICH and another smaller L temporooccipital ICH with associated vasogenic edema.On exam noted to have R leg and arm twitching, Unable to abduct R eye and adduct Left eye. EEG to evaluate for seizure   Level of alertness: lethargic, asleep   AEDs during EEG study: Keppra, ativan   Technical aspects: This EEG study was done with scalp electrodes positioned according to the 10-20 International system of electrode placement. Electrical activity was acquired at a sampling rate of 500Hz  and reviewed with a high frequency filter of 70Hz  and a low frequency filter of 1Hz . EEG data were recorded continuously and digitally stored.   Description: No posterior dominant rhythm was seen.   Sleep was characterized by vertex waves, sleep spindles (12 to 14 Hz), maximal frontocentral region.  EEG showed continuous generalized 3 to 6  Hz theta-delta slowing admixed with15 to 18 Hz beta activity with irregular morphology distributed symmetrically and diffusely.  Hyperventilation and photic stimulation were not performed.   ABNORMALITY -Excessive beta, generalized -Continuous slow, generalized   IMPRESSION: This study is suggestive of moderate diffuse encephalopathy, nonspecific etiology but likely related to sedation, post-ictal state. The excessive beta activity seen in the background is most likely due to the effect of benzodiazepine and is a benign EEG pattern. No seizures or definite epileptiform discharges were seen throughout the recording.     Priyanka O Yadav     VAS US CAROTID   Result Date: 07/23/2020 Carotid Arterial Duplex Study Indications:       CVA. Risk Factors:      Hypertension, hyperlipidemia. Limitations        Today's  exam was limited due to the patient's respiratory                    variation and patient positioning. Comparison Study:  no prior Performing Technologist: Abram Sander RVS  Examination Guidelines: A complete evaluation includes B-mode imaging, spectral Doppler, color Doppler, and power Doppler as needed of all accessible portions of each vessel. Bilateral testing is considered an integral part of a complete examination. Limited examinations for reoccurring indications may be performed as noted.  Right Carotid Findings: +----------+--------+--------+--------+------------------+--------------+           PSV cm/sEDV cm/sStenosisPlaque DescriptionComments       +----------+--------+--------+--------+------------------+--------------+ CCA Prox  34      7               heterogenous                     +----------+--------+--------+--------+------------------+--------------+ CCA Distal26      4               heterogenous                     +----------+--------+--------+--------+------------------+--------------+ ICA Prox  27      6       1-39%   heterogenous                     +----------+--------+--------+--------+------------------+--------------+ ICA Distal33      8                                                +----------+--------+--------+--------+------------------+--------------+ ECA                                                 Not visualized +----------+--------+--------+--------+------------------+--------------+ +----------+--------+-------+--------+-------------------+           PSV cm/sEDV cmsDescribeArm Pressure (mmHG) +----------+--------+-------+--------+-------------------+ [id number]                                         +----------+--------+-------+--------+-------------------+ +---------+--------+--------+--------------+ VertebralPSV cm/sEDV cm/sNot identified +---------+--------+--------+--------------+  Left Carotid Findings:  +----------+--------+--------+--------+------------------+--------+           PSV cm/sEDV cm/sStenosisPlaque DescriptionComments +----------+--------+--------+--------+------------------+--------+ CCA Prox  49      9  heterogenous               +----------+--------+--------+--------+------------------+--------+ CCA Distal40      11              heterogenous               +----------+--------+--------+--------+------------------+--------+ ICA Prox  35      9       1-39%   heterogenous               +----------+--------+--------+--------+------------------+--------+ ICA Distal54      14                                         +----------+--------+--------+--------+------------------+--------+ ECA       25                                                 +----------+--------+--------+--------+------------------+--------+ +----------+--------+--------+--------+-------------------+           PSV cm/sEDV cm/sDescribeArm Pressure (mmHG) +----------+--------+--------+--------+-------------------+ [id number]                                          +----------+--------+--------+--------+-------------------+ +---------+--------+--+--------+-+---------+ VertebralPSV cm/s30EDV cm/s8Antegrade +---------+--------+--+--------+-+---------+   Summary: Right Carotid: Velocities in the right ICA are consistent with a 1-39% stenosis. Left Carotid: Velocities in the left ICA are consistent with a 1-39% stenosis. Vertebrals: Left vertebral artery demonstrates antegrade flow. Right vertebral             artery was not visualized. *See table(s) above for measurements and observations.  Electronically signed by Antony Contras MD on 07/23/2020 at 12:48:50 PM.    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 Does the need for close, 24 hr/day medical supervision in concert with the patient's rehab needs make it  unreasonable for this patient to be served in a less intensive setting? Yes 2. Co-Morbidities requiring supervision/potential complications: bilateral occipital cortical dysfunction, cerebral hemorrhage, moderate diffuse encephalopathy 3. Due to bladder management, bowel management, safety, skin/wound care, disease management, medication administration, pain management and patient education, does the patient require 24 hr/day rehab nursing? Yes 4. Does the patient require coordinated care of a physician, rehab nurse, therapy disciplines of PT, OT, SLP to address physical and functional deficits in the context of the above medical diagnosis(es)? Yes Addressing deficits in the following areas: balance, endurance, locomotion, strength, transferring, bowel/bladder control, bathing, dressing, feeding, grooming, toileting, cognition, speech, language, swallowing and psychosocial support 5. Can the patient actively participate in an intensive therapy program of at least 3 hrs of therapy per day at least 5 days per week? Yes 6. The potential for patient to make measurable gains while on inpatient rehab is fair 7. Anticipated functional outcomes upon discharge from inpatient rehab are mod assist  with PT, mod assist with OT, mod assist with SLP. 8. Estimated rehab length of stay to reach the above functional goals is: 20-22 days 9. Anticipated discharge destination: Home 10. Overall Rehab/Functional Prognosis: fair   RECOMMENDATIONS: This patient's condition is appropriate for continued rehabilitative care in the following setting: CIR Patient has agreed to participate in recommended program. Yes  Note that insurance prior authorization may be required for reimbursement for recommended care.   Comment: Mrs. Haughton would be an appropriate CIR candidate if she has significant home assistance upon discharge. Can consider Baclofen 5mg  for increased tone on right side, but it may worsen cognitive deficits.      Lavon Paganini Angiulli, PA-C 07/24/2020    I have personally performed a face to face diagnostic evaluation, including, but not limited to relevant history and physical exam findings, of this patient and developed relevant assessment and plan.  Additionally, I have reviewed and concur with the physician assistant's documentation above.   Leeroy Cha, MD

## 2020-07-26 NOTE — PMR Pre-admission (Signed)
PMR Admission Coordinator Pre-Admission Assessment  Patient: Lori Bridges is an 86 y.o., female MRN: [id number] DOB: 10/04/1930 Height:   Weight: 69.4 kg              Insurance Information HMO:     PPO:      PCP:      IPA:      80/20:      OTHER:  PRIMARY: Medicare A and B      Policy#: [id number]      Subscriber: pt CM Name:       Phone#:      Fax#:  Pre-Cert#: verified Civil engineer, contracting:  Benefits:  Phone #:      Name:  Eff. Date: 06/28/97 A and B     Deduct: $1484      Out of Pocket Max: n/a      Life Max: n/a  CIR: 100%      SNF: 20 full days Outpatient: 80%     Co-Pay: 20% Home Health: 100%      Co-Pay:  DME: 80%     Co-Pay: 20% Providers: pt choice  SECONDARY: BCBS Supplement      Policy#: [id number]      Phone#:   Financial Counselor:       Phone#:   The Engineer, petroleum" for patients in Inpatient Rehabilitation Facilities with attached "Privacy Act [place] Records" was provided and verbally reviewed with: Patient and Family  Emergency Contact Information Contact Information    Name Relation Home Work Mobile   [place] Daughter [phone]       Current Medical History  Patient Admitting Diagnosis: ICH   History of Present Illness: Lori Hymen. Bridges is a 86 year old right-handed female history of atrial fibrillation maintained on Coumadin, hypertension, hyperlipidemia, chronic kidney disease.  Presented July 23, 2020 with increasing confusion as well as slurred speech and subtle hand and forearm twitching..  CT/MRI showed acute intraparenchymal hemorrhage in the left vertex 2.2 x 1.7 x 2.4 cm in size with surrounding vasogenic edema.  Additional intraparenchymal hemorrhage in the left temporal lobe 11 x 6 x 10 mm.  No midline shift or hydrocephalus.  CT angiogram of head no evidence of enhancing mass or lesion.  Admission chemistries with potassium 3.2, glucose 126, creatinine 1.22 alcohol negative SARS coronavirus negative, INR  1.5.  She was loaded with Keppra and EEG negative for seizure.  Patient did receive Kcentra/vitamin K to reverse Coumadin.  Cardene drip initially for blood pressure controlled.  She was cleared to begin subcutaneous heparin for DVT prophylaxis July 25, 2020.  She remains n.p.o. with alternative means of nutritional support.  Patient with findings of E. coli urinary tract infection completing course of Rocephin.  Therapy evaluations completed and patient was admitted for a comprehensive rehab program.  Complete NIHSS TOTAL: 16  Past Medical History  Past Medical History:  Diagnosis Date  . Adenomatous colon polyp 2005  . Anxiety age 44  . Carotid artery bruit 2001   left  . CKD (chronic kidney disease), stage III   . DDD (degenerative disc disease)   . DJD (degenerative joint disease)   . Gastroenteritis 2011  . GERD (gastroesophageal reflux disease) 2003  . History of pneumonia   . HTN (hypertension) 1983  . Hyperlipidemia 2000  . Hypothyroidism 2009  . Iron deficiency anemia 1983  . Osteoarthritis 2001  . Osteoporosis 2001  . Permanent atrial fibrillation ([place]) 07/23/2010       .  Persistent headaches 1987  . Pulmonary HTN ([place]) 04/03/2017   Moderate with PASP 47mmHg by echo 02/2017  . Tricuspid regurgitation    severe by echo 02/25/12  . Vertigo   . Vitamin D deficiency 2011    Family History  family history includes Diabetes in her mother; Heart disease (age of onset: 81) in her brother; Hypertension in her father; Liver cancer in her sister; Peripheral vascular disease in her father.  Prior Rehab/Hospitalizations:  Has the patient had prior rehab or hospitalizations prior to admission? No  Has the patient had major surgery during 100 days prior to admission? No  Current Medications   Current Facility-Administered Medications:  .   stroke: mapping our early stages of recovery book, , Does not apply, Once, Donnetta Simpers, MD .  0.9 %  sodium chloride infusion, ,  Intravenous, Continuous, Rosalin Hawking, MD, Last Rate: 10 mL/hr at 07/26/20 0500, Rate Verify at 07/26/20 0500 .  acetaminophen (TYLENOL) tablet 650 mg, 650 mg, Oral, Q4H PRN **OR** acetaminophen (TYLENOL) 160 MG/5ML solution 650 mg, 650 mg, Per Tube, Q4H PRN **OR** acetaminophen (TYLENOL) suppository 650 mg, 650 mg, Rectal, Q4H PRN, Donnetta Simpers, MD .  cefTRIAXone (ROCEPHIN) 1 g in sodium chloride 0.9 % 100 mL IVPB, 1 g, Intravenous, Q24H, Rosalin Hawking, MD, Stopped at 07/25/20 1554 .  Chlorhexidine Gluconate Cloth 2 % PADS 6 each, 6 each, Topical, Daily, Rosalin Hawking, MD, 6 each at 07/26/20 1303 .  feeding supplement (OSMOLITE 1.2 CAL) liquid 1,000 mL, 1,000 mL, Per Tube, Continuous, Rosalin Hawking, MD, Last Rate: 55 mL/hr at 07/26/20 1258, 1,000 mL at 07/26/20 1258 .  feeding supplement (PROSource TF) liquid 45 mL, 45 mL, Per Tube, BID, Rosalin Hawking, MD, 45 mL at 07/26/20 1021 .  free water 150 mL, 150 mL, Per Tube, Q4H, Rosalin Hawking, MD, 150 mL at 07/26/20 1347 .  heparin injection 5,000 Units, 5,000 Units, Subcutaneous, Q12H, Rosalin Hawking, MD, 5,000 Units at 07/26/20 1040 .  labetalol (NORMODYNE) injection 5-20 mg, 5-20 mg, Intravenous, Q2H PRN, Rosalin Hawking, MD .  levETIRAcetam (KEPPRA) 100 MG/ML solution 1,000 mg, 1,000 mg, Per Tube, BID, Amie Portland, MD, 1,000 mg at 07/26/20 1029 .  levothyroxine (SYNTHROID) tablet 50 mcg, 50 mcg, Per Tube, QAC breakfast, Rosalin Hawking, MD, 50 mcg at 07/26/20 0519 .  LORazepam (ATIVAN) tablet 0.5 mg, 0.5 mg, Per Tube, QHS, Rosalin Hawking, MD, 0.5 mg at 07/25/20 2123 .  MEDLINE mouth rinse, 15 mL, Mouth Rinse, BID, Rosalin Hawking, MD, 15 mL at 07/26/20 1000 .  metoprolol tartrate (LOPRESSOR) tablet 75 mg, 75 mg, Per Tube, BID, Rosalin Hawking, MD .  pantoprazole sodium (PROTONIX) 40 mg/20 mL oral suspension 40 mg, 40 mg, Per Tube, Daily, Rosalin Hawking, MD, 40 mg at 07/26/20 1020 .  senna-docusate (Senokot-S) tablet 1 tablet, 1 tablet, Per Tube, BID, Rosalin Hawking, MD, 1 tablet at  07/25/20 2132  Patients Current Diet:  Diet Order            Diet NPO time specified  Diet effective now                 Precautions / Restrictions Precautions Precautions: Fall Precaution Comments: Cortrak Restrictions Weight Bearing Restrictions: No   Has the patient had 2 or more falls or a fall with injury in the past year?Yes  Prior Activity Level Limited Community (1-2x/wk): ambulated with cane prior to admission, not driving, but daughter was taking her to appointments, getting her hair done, running errands, etc.  Prior Functional Level Prior Function Level of Independence: Independent with assistive device(s) Comments: per chart, was walking with RW, was able dress and bath self, per chart per daughter states her memory has started declining and she has started managing pt's finances  Self Care: Did the patient need help bathing, dressing, using the toilet or eating?  Independent  Indoor Mobility: Did the patient need assistance with walking from room to room (with or without device)? Independent  Stairs: Did the patient need assistance with internal or external stairs (with or without device)? Independent  Functional Cognition: Did the patient need help planning regular tasks such as shopping or remembering to take medications? Independent  Home Assistive Devices / Equipment    Prior Device Use: Indicate devices/aids used by the patient prior to current illness, exacerbation or injury? cane  Current Functional Level Cognition  Arousal/Alertness: Awake/alert Overall Cognitive Status: Impaired/Different from baseline Current Attention Level: Focused Orientation Level: Oriented to person Following Commands: Follows one step commands inconsistently Safety/Judgement: Decreased awareness of safety, Decreased awareness of deficits General Comments: Pt with minimal communication, occasionally states "uh-hu"; family was present Awareness: Impaired Awareness  Impairment: Emergent impairment Problem Solving: Impaired Problem Solving Impairment: Functional basic    Extremity Assessment (includes Sensation/Coordination)  Upper Extremity Assessment: Defer to OT evaluation  Lower Extremity Assessment: RLE deficits/detail, LLE deficits/detail RLE Deficits / Details: hold R LE in hip flexion and external rotation with foot next to L knee (figure 4 position) pt appears to have incresaed flexor tone, does move it actively however not as much as L LLE Deficits / Details: delayed processing and squencing    ADLs  Overall ADL's : Needs assistance/impaired Grooming: Maximal assistance Lower Body Dressing: Maximal assistance, Sitting/lateral leans Lower Body Dressing Details (indicate cue type and reason): pt using bil Ue to open sock but unable to loop foot into the sock.pt was able to figure 4 R LE but needs (A) to position L LE. pt was unable to continue to pull up L sock. Pt was able to continue sequence on R LE.  General ADL Comments: pt requires (A) for static sitting balance and then progressed to LB dressing task. PT continued to move R sid einto flexion patterns throughout session. pt was abl to move out of them but holding them in that position at rest    Mobility  Overal bed mobility: Needs Assistance Bed Mobility: Rolling, Sidelying to Sit, Sit to Supine Rolling: Total assist, +2 for physical assistance Sidelying to sit: Max assist, +2 for physical assistance Supine to sit: Total assist Sit to supine: Total assist, +2 for physical assistance General bed mobility comments: Max cues and positioned extremities in positions to assist (legs flexed to roll); required max to total assist    Transfers  Overall transfer level: Needs assistance Equipment used: 2 person hand held assist Transfers: Sit to/from Stand Sit to Stand: Total assist, From elevated surface, +2 physical assistance Stand pivot transfers: Max assist, +2 physical  assistance General transfer comment: Performed 3 parital stands with bil knees block, use of gait belt and bed pad to faciliate, and provided support at shoulders    Ambulation / Gait / Stairs / Wheelchair Mobility  Ambulation/Gait General Gait Details: unable at this time    Posture / Balance Dynamic Sitting Balance Sitting balance - Comments: Initially requiring mod A; able to position pt either on R elbow or hands on knees and could hold position with supervision for 30 sec to 1 minutes with multiple reps;  tending to lean R provided tactile facilitation, verbal, and visual cues to correct - pt did try to correct twice without assistance Balance Overall balance assessment: Needs assistance Sitting-balance support: Bilateral upper extremity supported, No upper extremity supported, Feet supported, Single extremity supported Sitting balance-Leahy Scale: Poor Sitting balance - Comments: Initially requiring mod A; able to position pt either on R elbow or hands on knees and could hold position with supervision for 30 sec to 1 minutes with multiple reps; tending to lean R provided tactile facilitation, verbal, and visual cues to correct - pt did try to correct twice without assistance Postural control: Posterior lean, Right lateral lean Standing balance support: Bilateral upper extremity supported Standing balance-Leahy Scale: Zero Standing balance comment: dependent on physical assist    Special needs/care consideration Oxygen 2L in hospital, not at home, Special service needs potential decrease BOC and Designated visitor daughters, Mauri [place] and [place] (from acute therapy documentation) Living Arrangements:  (grandson) Available Help at Discharge: Family, Available 24 hours/day Type of Home: [place]: No Additional Comments: pt poor historian, info provided by chart  Discharge Living Setting Plans for Discharge Living Setting: Lives  with (comment) (daughter) Type of Home at Discharge: House Discharge Home Layout: One level Discharge Home Access: Stairs to enter Entrance Stairs-Rails: Right, Left (cannot reach both) Entrance Stairs-Number of Steps: 4 Discharge Bathroom Shower/Tub: Tub/shower unit Discharge Bathroom Toilet: Standard Discharge Bathroom Accessibility: Yes How Accessible: Accessible via walker Does the patient have any problems obtaining your medications?: No  Social/Family/Support Systems Anticipated Caregiver: Mauri [place] (primary contact), seven children, grand children, and other family members Anticipated Caregiver's Contact Information: [name] [phone] Ability/Limitations of Caregiver: mod assist Caregiver Availability: 24/7 Discharge Plan Discussed with Primary Caregiver: Yes Is Caregiver In Agreement with Plan?: Yes Does Caregiver/Family have Issues with Lodging/Transportation while Pt is in Rehab?: No   Goals Patient/Family Goal for Rehab: PT/OT/SLP min to mod assist Expected length of stay: 28-32 days Additional Information: Pt's family not interested in SNF at this time; however, pt would be appropriate for decrease BOC to SNF if they would be open to it.  Pt/Family Agrees to Admission and willing to participate: Yes Program Orientation Provided & Reviewed with Pt/Caregiver Including Roles  & Responsibilities: Yes   Decrease burden of Care through IP rehab admission: Specialzed equipment needs, Diet advancement, Decrease number of caregivers and Patient/family education   Possible need for SNF placement upon discharge: Potentially    Patient Condition: This patient's condition remains as documented in the consult dated 07/24/20, in which the Rehabilitation Physician determined and documented that the patient's condition is appropriate for intensive rehabilitative care in an inpatient rehabilitation facility. Will admit to inpatient rehab today.  Preadmission Screen Completed By:   Michel Santee, PT, DPT 07/26/2020 1:56 PM ______________________________________________________________________   Discussed status with Dr. Ranell Patrick on 07/26/20 at 2:00 PM  and received approval for admission today.  Admission Coordinator:  Michel Santee, PT, DPT time 2:00 PM Sudie Grumbling 07/26/20

## 2020-07-26 NOTE — Progress Notes (Signed)
SLP Cancellation Note  Patient Details Name: Lori Bridges MRN: [id number] DOB: 11-22-1930   Cancelled treatment:       Reason Eval/Treat Not Completed: Patient declined, no reason specified Pt refused any PO trials both verbally and by keeping mouth closed. She eventually stated "nausea." RN notified. Cortrak remains in place for nutrition.  ST to continue efforts.  Lori Bridges, M.S., CCC-SLP Speech-Language Pathologist Acute Rehabilitation Services Pager: [place] 07/26/2020, 2:14 PM

## 2020-07-26 NOTE — Progress Notes (Signed)
Inpatient Rehabilitation Medication Review by a Pharmacist  A complete drug regimen review was completed for this patient to identify any potential clinically significant medication issues.  Clinically significant medication issues were identified:  yes   Type of Medication Issue Identified Description of Issue Urgent (address now) Non-Urgent (address on AM team rounds) Plan Plan Accepted by Provider? (Yes / No / Pending AM Rounds)  Drug Interaction(s) (clinically significant)       Duplicate Therapy       Allergy       No Medication Administration End Date       Incorrect Dose       Additional Drug Therapy Needed  - metoprolol tartrate not restarted at CIR admission (dose increased to 75mg  in d/c summary) Urgent (pt HR 90-110s today) - called PA on call and received verbal order for metoprolol tartrate 75mg  BID as per discharge summary yes  Other         Name of provider notified for urgent issues identified: Algis Liming, PA  Provider Method of Notification: telephone  Pharmacist comments: urgent medication issue identified. Resolved with phone call to PA and verbal order received  Time spent performing this drug regimen review (minutes):  20 mins   Brendolyn Patty, PharmD Clinical Pharmacist  07/26/2020   6:00 PM   Please check AMION for all [facility] phone numbers After 10:00 PM, call the [facility] [phone]

## 2020-07-26 NOTE — Progress Notes (Signed)
PMR Admission Coordinator Pre-Admission Assessment   Patient: Lori Bridges is an 86 y.o., female MRN: [id number] DOB: December 06, 1930 Height:   Weight: 69.4 kg                                                                                                                                                  Insurance Information HMO:     PPO:      PCP:      IPA:      80/20:      OTHER:  PRIMARY: Medicare A and B      Policy#: [id number]      Subscriber: pt CM Name:       Phone#:      Fax#:  Pre-Cert#: verified Civil engineer, contracting:  Benefits:  Phone #:      Name:  Eff. Date: 06/28/97 A and B     Deduct: $1484      Out of Pocket Max: n/a      Life Max: n/a  CIR: 100%      SNF: 20 full days Outpatient: 80%     Co-Pay: 20% Home Health: 100%      Co-Pay:  DME: 80%     Co-Pay: 20% Providers: pt choice  SECONDARY: BCBS Supplement      Policy#: [id number]      Phone#:    Financial Counselor:       Phone#:    The Engineer, petroleum" for patients in Inpatient Rehabilitation Facilities with attached "Privacy Act Abanda Records" was provided and verbally reviewed with: Patient and Family   Emergency Contact Information Contact Information     Name Relation Home Work Mobile    [place] Daughter [phone]           Current Medical History  Patient Admitting Diagnosis: ICH    History of Present Illness: Lori Bridges is a 86 year old right-handed female history of atrial fibrillation maintained on Coumadin, hypertension, hyperlipidemia, chronic kidney disease.  Presented July 23, 2020 with increasing confusion as well as slurred speech and subtle hand and forearm twitching..  CT/MRI showed acute intraparenchymal hemorrhage in the left vertex 2.2 x 1.7 x 2.4 cm in size with surrounding vasogenic edema.  Additional intraparenchymal hemorrhage in the left temporal lobe 11 x 6 x 10 mm.  No midline shift or hydrocephalus.  CT angiogram of head no evidence of  enhancing mass or lesion.  Admission chemistries with potassium 3.2, glucose 126, creatinine 1.22 alcohol negative SARS coronavirus negative, INR 1.5.  She was loaded with Keppra and EEG negative for seizure.  Patient did receive Kcentra/vitamin K to reverse Coumadin.  Cardene drip initially for blood pressure controlled.  She was cleared to begin subcutaneous heparin for DVT prophylaxis July 25, 2020.  She remains n.p.o. with alternative means of nutritional  support.  Patient with findings of E. coli urinary tract infection completing course of Rocephin.  Therapy evaluations completed and patient was admitted for a comprehensive rehab program.   Complete NIHSS TOTAL: 16   Past Medical History      Past Medical History:  Diagnosis Date  . Adenomatous colon polyp 2005  . Anxiety age 9  . Carotid artery bruit 2001    left  . CKD (chronic kidney disease), stage III    . DDD (degenerative disc disease)    . DJD (degenerative joint disease)    . Gastroenteritis 2011  . GERD (gastroesophageal reflux disease) 2003  . History of pneumonia    . HTN (hypertension) 1983  . Hyperlipidemia 2000  . Hypothyroidism 2009  . Iron deficiency anemia 1983  . Osteoarthritis 2001  . Osteoporosis 2001  . Permanent atrial fibrillation ([place]) 07/23/2010        . Persistent headaches 1987  . Pulmonary HTN ([place]) 04/03/2017    Moderate with PASP 60mmHg by echo 02/2017  . Tricuspid regurgitation      severe by echo 02/25/12  . Vertigo    . Vitamin D deficiency 2011      Family History  family history includes Diabetes in her mother; Heart disease (age of onset: 70) in her brother; Hypertension in her father; Liver cancer in her sister; Peripheral vascular disease in her father.   Prior Rehab/Hospitalizations:  Has the patient had prior rehab or hospitalizations prior to admission? No   Has the patient had major surgery during 100 days prior to admission? No   Current Medications    Current  Facility-Administered Medications:  .   stroke: mapping our early stages of recovery book, , Does not apply, Once, Donnetta Simpers, MD .  0.9 %  sodium chloride infusion, , Intravenous, Continuous, Rosalin Hawking, MD, Last Rate: 10 mL/hr at 07/26/20 0500, Rate Verify at 07/26/20 0500 .  acetaminophen (TYLENOL) tablet 650 mg, 650 mg, Oral, Q4H PRN **OR** acetaminophen (TYLENOL) 160 MG/5ML solution 650 mg, 650 mg, Per Tube, Q4H PRN **OR** acetaminophen (TYLENOL) suppository 650 mg, 650 mg, Rectal, Q4H PRN, Donnetta Simpers, MD .  cefTRIAXone (ROCEPHIN) 1 g in sodium chloride 0.9 % 100 mL IVPB, 1 g, Intravenous, Q24H, Rosalin Hawking, MD, Stopped at 07/25/20 1554 .  Chlorhexidine Gluconate Cloth 2 % PADS 6 each, 6 each, Topical, Daily, Rosalin Hawking, MD, 6 each at 07/26/20 1303 .  feeding supplement (OSMOLITE 1.2 CAL) liquid 1,000 mL, 1,000 mL, Per Tube, Continuous, Rosalin Hawking, MD, Last Rate: 55 mL/hr at 07/26/20 1258, 1,000 mL at 07/26/20 1258 .  feeding supplement (PROSource TF) liquid 45 mL, 45 mL, Per Tube, BID, Rosalin Hawking, MD, 45 mL at 07/26/20 1021 .  free water 150 mL, 150 mL, Per Tube, Q4H, Rosalin Hawking, MD, 150 mL at 07/26/20 1347 .  heparin injection 5,000 Units, 5,000 Units, Subcutaneous, Q12H, Rosalin Hawking, MD, 5,000 Units at 07/26/20 1040 .  labetalol (NORMODYNE) injection 5-20 mg, 5-20 mg, Intravenous, Q2H PRN, Rosalin Hawking, MD .  levETIRAcetam (KEPPRA) 100 MG/ML solution 1,000 mg, 1,000 mg, Per Tube, BID, Amie Portland, MD, 1,000 mg at 07/26/20 1029 .  levothyroxine (SYNTHROID) tablet 50 mcg, 50 mcg, Per Tube, QAC breakfast, Rosalin Hawking, MD, 50 mcg at 07/26/20 0519 .  LORazepam (ATIVAN) tablet 0.5 mg, 0.5 mg, Per Tube, QHS, Rosalin Hawking, MD, 0.5 mg at 07/25/20 2123 .  MEDLINE mouth rinse, 15 mL, Mouth Rinse, BID, Rosalin Hawking, MD, 15 mL at 07/26/20 1000 .  metoprolol tartrate (LOPRESSOR) tablet 75 mg, 75 mg, Per Tube, BID, Rosalin Hawking, MD .  pantoprazole sodium (PROTONIX) 40 mg/20 mL oral  suspension 40 mg, 40 mg, Per Tube, Daily, Rosalin Hawking, MD, 40 mg at 07/26/20 1020 .  senna-docusate (Senokot-S) tablet 1 tablet, 1 tablet, Per Tube, BID, Rosalin Hawking, MD, 1 tablet at 07/25/20 2132   Patients Current Diet:     Diet Order                      Diet NPO time specified  Diet effective now                      Precautions / Restrictions Precautions Precautions: Fall Precaution Comments: [place]rak Restrictions Weight Bearing Restrictions: No    Has the patient had 2 or more falls or a fall with injury in the past year?Yes   Prior Activity Level Limited Community (1-2x/wk): ambulated with cane prior to admission, not driving, but daughter was taking her to appointments, getting her hair done, running errands, etc.    Prior Functional Level Prior Function Level of Independence: Independent with assistive device(s) Comments: per chart, was walking with RW, was able dress and bath self, per chart per daughter states her memory has started declining and she has started managing pt's finances   Self Care: Did the patient need help bathing, dressing, using the toilet or eating?  Independent   Indoor Mobility: Did the patient need assistance with walking from room to room (with or without device)? Independent   Stairs: Did the patient need assistance with internal or external stairs (with or without device)? Independent   Functional Cognition: Did the patient need help planning regular tasks such as shopping or remembering to take medications? Independent   Home Assistive Devices / Equipment   Prior Device Use: Indicate devices/aids used by the patient prior to current illness, exacerbation or injury? cane   Current Functional Level Cognition   Arousal/Alertness: Awake/alert Overall Cognitive Status: Impaired/Different from baseline Current Attention Level: Focused Orientation Level: Oriented to person Following Commands: Follows one step commands  inconsistently Safety/Judgement: Decreased awareness of safety, Decreased awareness of deficits General Comments: Pt with minimal communication, occasionally states "uh-hu"; family was present Awareness: Impaired Awareness Impairment: Emergent impairment Problem Solving: Impaired Problem Solving Impairment: Functional basic    Extremity Assessment (includes Sensation/Coordination)   Upper Extremity Assessment: Defer to OT evaluation  Lower Extremity Assessment: RLE deficits/detail, LLE deficits/detail RLE Deficits / Details: hold R LE in hip flexion and external rotation with foot next to L knee (figure 4 position) pt appears to have incresaed flexor tone, does move it actively however not as much as L LLE Deficits / Details: delayed processing and squencing     ADLs   Overall ADL's : Needs assistance/impaired Grooming: Maximal assistance Lower Body Dressing: Maximal assistance, Sitting/lateral leans Lower Body Dressing Details (indicate cue type and reason): pt using bil Ue to open sock but unable to loop foot into the sock.pt was able to figure 4 R LE but needs (A) to position L LE. pt was unable to continue to pull up L sock. Pt was able to continue sequence on R LE.  General ADL Comments: pt requires (A) for static sitting balance and then progressed to LB dressing task. PT continued to move R sid einto flexion patterns throughout session. pt was abl to move out of them but holding them in that position at rest  Mobility   Overal bed mobility: Needs Assistance Bed Mobility: Rolling, Sidelying to Sit, Sit to Supine Rolling: Total assist, +2 for physical assistance Sidelying to sit: Max assist, +2 for physical assistance Supine to sit: Total assist Sit to supine: Total assist, +2 for physical assistance General bed mobility comments: Max cues and positioned extremities in positions to assist (legs flexed to roll); required max to total assist     Transfers   Overall transfer  level: Needs assistance Equipment used: 2 person hand held assist Transfers: Sit to/from Stand Sit to Stand: Total assist, From elevated surface, +2 physical assistance Stand pivot transfers: Max assist, +2 physical assistance General transfer comment: Performed 3 parital stands with bil knees block, use of gait belt and bed pad to faciliate, and provided support at shoulders     Ambulation / Gait / Stairs / Wheelchair Mobility   Ambulation/Gait General Gait Details: unable at this time     Posture / Balance Dynamic Sitting Balance Sitting balance - Comments: Initially requiring mod A; able to position pt either on R elbow or hands on knees and could hold position with supervision for 30 sec to 1 minutes with multiple reps; tending to lean R provided tactile facilitation, verbal, and visual cues to correct - pt did try to correct twice without assistance Balance Overall balance assessment: Needs assistance Sitting-balance support: Bilateral upper extremity supported, No upper extremity supported, Feet supported, Single extremity supported Sitting balance-Leahy Scale: Poor Sitting balance - Comments: Initially requiring mod A; able to position pt either on R elbow or hands on knees and could hold position with supervision for 30 sec to 1 minutes with multiple reps; tending to lean R provided tactile facilitation, verbal, and visual cues to correct - pt did try to correct twice without assistance Postural control: Posterior lean, Right lateral lean Standing balance support: Bilateral upper extremity supported Standing balance-Leahy Scale: Zero Standing balance comment: dependent on physical assist     Special needs/care consideration Oxygen 2L in hospital, not at home, Special service needs potential decrease BOC and Designated visitor daughters, Lori Bridges and Lori Bridges (from acute therapy documentation) Living Arrangements:  (grandson) Available Help  at Discharge: Family, Available 24 hours/day Type of Home: [place]: No Additional Comments: pt poor historian, info provided by chart   Discharge Living Setting Plans for Discharge Living Setting: Lives with (comment) (daughter) Type of Home at Discharge: House Discharge Home Layout: One level Discharge Home Access: Stairs to enter Entrance Stairs-Rails: Right, Left (cannot reach both) Entrance Stairs-Number of Steps: 4 Discharge Bathroom Shower/Tub: Tub/shower unit Discharge Bathroom Toilet: Standard Discharge Bathroom Accessibility: Yes How Accessible: Accessible via walker Does the patient have any problems obtaining your medications?: No   Social/Family/Support Systems Anticipated Caregiver: Lori Bridges (primary contact), seven children, grand children, and other family members Anticipated Caregiver's Contact Information: Lori Bridges [phone] Ability/Limitations of Caregiver: mod assist Caregiver Availability: 24/7 Discharge Plan Discussed with Primary Caregiver: Yes Is Caregiver In Agreement with Plan?: Yes Does Caregiver/Family have Issues with Lodging/Transportation while Pt is in Rehab?: No     Goals Patient/Family Goal for Rehab: PT/OT/SLP min to mod assist Expected length of stay: 28-32 days Additional Information: Pt's family not interested in SNF at this time; however, pt would be appropriate for decrease BOC to SNF if they would be open to it.  Pt/Family Agrees to Admission and willing to participate: Yes Program Orientation Provided & Reviewed with Pt/Caregiver  Including Roles  & Responsibilities: Yes     Decrease burden of Care through IP rehab admission: Specialzed equipment needs, Diet advancement, Decrease number of caregivers and Patient/family education     Possible need for SNF placement upon discharge: Potentially      Patient Condition: This patient's condition remains as documented in the consult dated 07/24/20, in which the  Rehabilitation Physician determined and documented that the patient's condition is appropriate for intensive rehabilitative care in an inpatient rehabilitation facility. Will admit to inpatient rehab today.   Preadmission Screen Completed By:  Michel Santee, PT, DPT 07/26/2020 1:56 PM ______________________________________________________________________   Discussed status with Dr. Ranell Patrick on 07/26/20 at 2:00 PM  and received approval for admission today.   Admission Coordinator:  Michel Santee, PT, DPT time 2:00 PM Sudie Grumbling 07/26/20              Cosigned by: Izora Ribas, MD at 07/26/2020  2:11 PM

## 2020-07-26 NOTE — Progress Notes (Signed)
Weighed pt and weight was 77.6 so, because previous weight from 07/22/20 said that pt was 69 kg, we took pt off bed and reset the bed and re-weighed pt and weight was accurate at 77.6 kg.

## 2020-07-26 NOTE — H&P (Signed)
Physical Medicine and Rehabilitation Admission H&P    CC: Left ICH  HPI: Lori Bridges is a 86 year old right-handed female history of atrial fibrillation maintained on Coumadin, hypertension, hyperlipidemia, chronic kidney disease.  Per chart review lives with grandson.  Independent with assistive device was able to dress and bathe herself although family notes some recent decrease in memory.  She has 7 children in the area with good support.  Presented July 23, 2020 with increasing confusion as well as slurred speech and subtle hand and forearm twitching..  CT/MRI showed acute intraparenchymal hemorrhage in the left vertex 2.2 x 1.7 x 2.4 cm in size with surrounding vasogenic edema.  Additional intraparenchymal hemorrhage in the left temporal lobe 11 x 6 x 10 mm.  No midline shift or hydrocephalus.  CT angiogram of head no evidence of enhancing mass or lesion.  Admission chemistries with potassium 3.2, glucose 126, creatinine 1.22 alcohol negative SARS coronavirus negative, INR 1.5.  She was loaded with Keppra and EEG negative for seizure.  Patient did receive Kcentra/vitamin K to reverse Coumadin.  Cardene drip initially for blood pressure controlled.  She was cleared to begin subcutaneous heparin for DVT prophylaxis July 25, 2020.  She remains n.p.o. with alternative means of nutritional support.  Patient with findings of E. coli urinary tract infection completing course of Rocephin.  Therapy evaluations completed and patient was admitted for a comprehensive rehab program.  Review of Systems  Constitutional: Negative for chills and fever.  HENT: Negative for hearing loss.   Eyes: Negative for blurred vision and double vision.  Respiratory: Negative for cough and shortness of breath.   Cardiovascular: Positive for palpitations. Negative for chest pain and leg swelling.  Gastrointestinal: Positive for constipation. Negative for heartburn, nausea and vomiting.       GERD  Genitourinary:  Negative for dysuria, flank pain and hematuria.  Musculoskeletal: Positive for myalgias.  Skin: Negative for rash.  Neurological: Positive for speech change.  Psychiatric/Behavioral: Positive for memory loss.       Anxiety  All other systems reviewed and are negative.  Past Medical History:  Diagnosis Date  . Adenomatous colon polyp 2005  . Anxiety age 6  . Carotid artery bruit 2001   left  . CKD (chronic kidney disease), stage III   . DDD (degenerative disc disease)   . DJD (degenerative joint disease)   . Gastroenteritis 2011  . GERD (gastroesophageal reflux disease) 2003  . History of pneumonia   . HTN (hypertension) 1983  . Hyperlipidemia 2000  . Hypothyroidism 2009  . Iron deficiency anemia 1983  . Osteoarthritis 2001  . Osteoporosis 2001  . Permanent atrial fibrillation ([place]) 07/23/2010       . Persistent headaches 1987  . Pulmonary HTN ([place]) 04/03/2017   Moderate with PASP 29mmHg by echo 02/2017  . Tricuspid regurgitation    severe by echo 02/25/12  . Vertigo   . Vitamin D deficiency 2011   Past Surgical History:  Procedure Laterality Date  . COLONOSCOPY W/ BIOPSIES AND POLYPECTOMY  09/16/2004   adenomatous polyp, diverticulosis, internal hemorrhoids  . s/p BLT    . TUBAL LIGATION     Family History  Problem Relation Age of Onset  . Hypertension Father   . Peripheral vascular disease Father   . Diabetes Mother   . Heart disease Brother 63  . Liver cancer Sister    Social History:  reports that she has never smoked. She has never used smokeless tobacco. She reports  that she does not drink alcohol and does not use drugs. Allergies:  Allergies  Allergen Reactions  . Doxycycline Other (See Comments)    UNKNOWN unknown  . Ezetimibe Other (See Comments)    Arthralgia, Myalgias  . Meclizine Other (See Comments) and Palpitations    Tachycardia  . Pravastatin Other (See Comments)    Arthralgia, Myalgias  . Colestipol Other (See Comments)  . Sulfamethoxazole  Other (See Comments)    unknown  . Ace Inhibitors Cough  . Codeine Nausea Only  . Colestipol Hcl Other (See Comments)    Malaise  . Lovastatin Other (See Comments)    UNKNOWN Unknown  . Sulfa Antibiotics Other (See Comments)    unknown   Medications Prior to Admission  Medication Sig Dispense Refill  . acetaminophen (TYLENOL) 160 MG/5ML solution Place 20.3 mLs (650 mg total) into feeding tube every 4 (four) hours as needed for mild pain (or temp > 37.5 C (99.5 F)). 120 mL 0  . cefTRIAXone 1 g in sodium chloride 0.9 % 100 mL Inject 1 g into the vein daily.    . heparin 5000 UNIT/ML injection Inject 1 mL (5,000 Units total) into the skin every 12 (twelve) hours. 1 mL   . levETIRAcetam (KEPPRA) 100 MG/ML solution Place 10 mLs (1,000 mg total) into feeding tube 2 (two) times daily. 473 mL 12  . [START ON 07/27/2020] levothyroxine (SYNTHROID) 50 MCG tablet Place 1 tablet (50 mcg total) into feeding tube daily before breakfast.    . LORazepam (ATIVAN) 0.5 MG tablet Place 1 tablet (0.5 mg total) into feeding tube at bedtime. 30 tablet 0  . Metoprolol Tartrate 75 MG TABS Place 75 mg into feeding tube 2 (two) times daily.    . Mouthwashes (MOUTH RINSE) LIQD solution 15 mLs by Mouth Rinse route 2 (two) times daily.  0  . Nutritional Supplements (FEEDING SUPPLEMENT, OSMOLITE 1.2 CAL,) LIQD Place 1,000 mLs into feeding tube continuous.  0  . Nutritional Supplements (FEEDING SUPPLEMENT, PROSOURCE TF,) liquid Place 45 mLs into feeding tube 2 (two) times daily.    Derrill Memo ON 07/27/2020] pantoprazole sodium (PROTONIX) 40 mg/20 mL PACK Place 20 mLs (40 mg total) into feeding tube daily. 30 mL   . senna-docusate (SENOKOT-S) 8.6-50 MG tablet Place 1 tablet into feeding tube 2 (two) times daily.    . Water For Irrigation, Sterile (FREE WATER) SOLN Place 150 mLs into feeding tube every 4 (four) hours.      Drug Regimen Review Drug regimen was reviewed and remains appropriate with no significant issues  identified  Home: Home Living Family/patient expects to be discharged to:: Private residence Living Arrangements:  (grandson) Available Help at Discharge: Family, Available 24 hours/day Type of Home: House Additional Comments: pt poor historian, info provided by chart   Functional History: Prior Function Level of Independence: Independent with assistive device(s) Comments: per chart, was walking with RW, was able dress and bath self, per chart per daughter states her memory has started declining and she has started managing pt's finances  Functional Status:  Mobility: Bed Mobility Overal bed mobility: Needs Assistance Bed Mobility: Rolling, Supine to Sit, Sit to Supine Rolling: Total assist Sidelying to sit: Mod assist, Max assist Supine to sit: Total assist Sit to supine: Total assist General bed mobility comments: max directional verbal cues, maxA for LE management off bed, modA for trunk elevation, pt assisted with transfer once initiated  Transfers Overall transfer level: Needs assistance Equipment used: 1 person hand held assist  Transfers: Sit to/from Stand Sit to Stand: Total assist, From elevated surface (unable to clear buttocks) Stand pivot transfers: Max assist, +2 physical assistance General transfer comment: R knee blocked, dependence for R LE advancement, pt unable to sequence stepping, maxAx2 to maintain upright posture Ambulation/Gait General Gait Details: unable at this time  ADL: ADL Overall ADL's : Needs assistance/impaired Grooming: Maximal assistance Lower Body Dressing: Maximal assistance, Sitting/lateral leans Lower Body Dressing Details (indicate cue type and reason): pt using bil Ue to open sock but unable to loop foot into the sock.pt was able to figure 4 R LE but needs (A) to position L LE. pt was unable to continue to pull up L sock. Pt was able to continue sequence on R LE.  General ADL Comments: pt requires (A) for static sitting balance and  then progressed to LB dressing task. PT continued to move R sid einto flexion patterns throughout session. pt was abl to move out of them but holding them in that position at rest  Cognition: Cognition Overall Cognitive Status: Impaired/Different from baseline Arousal/Alertness: Awake/alert Orientation Level: Oriented to person Awareness: Impaired Awareness Impairment: Emergent impairment Problem Solving: Impaired Problem Solving Impairment: Functional basic Cognition Arousal/Alertness: Lethargic, Awake/alert (initially lethargic, becomes more alert with stimulation) Behavior During Therapy: Flat affect Overall Cognitive Status: Impaired/Different from baseline Area of Impairment: Attention, Memory, Following commands, Safety/judgement, Awareness, Problem solving Orientation Level: Disoriented to, Place, Time, Situation Current Attention Level: Focused Memory: Decreased recall of precautions, Decreased short-term memory Following Commands: Follows one step commands with increased time Safety/Judgement: Decreased awareness of safety, Decreased awareness of deficits Awareness: Intellectual Problem Solving: Decreased initiation, Difficulty sequencing, Slow processing, Requires verbal cues, Requires tactile cues General Comments: pt with severe word finding difficulty, despite incresaed tone in R UE and LE pt more apt to follow commands with R UE versus L UE. With tactile cues pt will follow commands with L UE  Physical Exam: Blood pressure (!) 132/89, pulse 90, temperature 97.6 F (36.4 C), resp. rate 18, height 5\' 6"  (1.676 m), weight 79.2 kg.  General: Lethargic, No apparent distress HEENT: Head is normocephalic, atraumatic, PERRLA, EOMI, sclera anicteric, oral mucosa pink and moist,  Nasogastric tube in place Neck: Supple without JVD or lymphadenopathy Heart: Reg rate and rhythm. No murmurs rubs or gallops Chest: CTA bilaterally without wheezes, rales, or rhonchi; no  distress Abdomen: Soft, non-tender, non-distended, bowel sounds positive. Extremities: No clubbing, cyanosis, or edema. Pulses are 2+ Skin: Clean and intact without signs of breakdown Neuro: Patient is lethargic but arousable.  She does open her eyes but essentially nonverbal with some grunting sounds.  She would fall back asleep during exam.  Daughter at bedside.  Psych: Pt's affect is lethargic  Results for orders placed or performed during the hospital encounter of 07/22/20 (from the past 48 hour(s))  Glucose, capillary     Status: None   Collection Time: 07/24/20  7:47 PM  Result Value Ref Range   Glucose-Capillary 77 70 - 99 mg/dL    Comment: Glucose reference range applies only to samples taken after fasting for at least 8 hours.  Glucose, capillary     Status: None   Collection Time: 07/24/20 11:30 PM  Result Value Ref Range   Glucose-Capillary 82 70 - 99 mg/dL    Comment: Glucose reference range applies only to samples taken after fasting for at least 8 hours.  Glucose, capillary     Status: None   Collection Time: 07/25/20  3:34 AM  Result  Value Ref Range   Glucose-Capillary 75 70 - 99 mg/dL    Comment: Glucose reference range applies only to samples taken after fasting for at least 8 hours.  CBC     Status: Abnormal   Collection Time: 07/25/20  3:38 AM  Result Value Ref Range   WBC 5.5 4.0 - 10.5 K/uL   RBC 4.54 3.87 - 5.11 MIL/uL   Hemoglobin 13.2 12.0 - 15.0 g/dL   HCT 41.8 36 - 46 %   MCV 92.1 80.0 - 100.0 fL   MCH 29.1 26.0 - 34.0 pg   MCHC 31.6 30.0 - 36.0 g/dL   RDW 18.2 (H) 11.5 - 15.5 %   Platelets 122 (L) 150 - 400 K/uL    Comment: REPEATED TO VERIFY   nRBC 0.0 0.0 - 0.2 %    Comment: Performed at [facility], [place] [address] [place], [place] [address]  Basic metabolic panel     Status: Abnormal   Collection Time: 07/25/20  3:38 AM  Result Value Ref Range   Sodium 145 135 - 145 mmol/L   Potassium 3.1 (L) 3.5 - 5.1 mmol/L   Chloride 113 (H) 98 - 111  mmol/L   CO2 21 (L) 22 - 32 mmol/L   Glucose, Bld 80 70 - 99 mg/dL    Comment: Glucose reference range applies only to samples taken after fasting for at least 8 hours.   BUN 23 8 - 23 mg/dL   Creatinine, Ser 1.24 (H) 0.44 - 1.00 mg/dL   Calcium 9.9 8.9 - 10.3 mg/dL   GFR calc non Af Amer 39 (L) >60 mL/min   GFR calc Af Amer 45 (L) >60 mL/min   Anion gap 11 5 - 15    Comment: Performed at [facility] [address] [place], [place] [address]  Glucose, capillary     Status: None   Collection Time: 07/25/20  8:03 AM  Result Value Ref Range   Glucose-Capillary 72 70 - 99 mg/dL    Comment: Glucose reference range applies only to samples taken after fasting for at least 8 hours.  Glucose, capillary     Status: None   Collection Time: 07/25/20 11:46 AM  Result Value Ref Range   Glucose-Capillary 89 70 - 99 mg/dL    Comment: Glucose reference range applies only to samples taken after fasting for at least 8 hours.  Magnesium     Status: None   Collection Time: 07/25/20 12:35 PM  Result Value Ref Range   Magnesium 1.9 1.7 - 2.4 mg/dL    Comment: Performed at [facility], [place] [address] [place], [place] [address]  Phosphorus     Status: None   Collection Time: 07/25/20 12:35 PM  Result Value Ref Range   Phosphorus 3.5 2.5 - 4.6 mg/dL    Comment: Performed at [facility], [place] [address] [place], [place] [address]  Magnesium     Status: None   Collection Time: 07/25/20  6:23 PM  Result Value Ref Range   Magnesium 2.0 1.7 - 2.4 mg/dL    Comment: Performed at [facility], [place] [address] [place], [place] [address]  Phosphorus     Status: None   Collection Time: 07/25/20  6:23 PM  Result Value Ref Range   Phosphorus 3.3 2.5 - 4.6 mg/dL    Comment: Performed at [facility], [place] [address] [place], [place] [address]  Glucose, capillary  Status: Abnormal   Collection Time: 07/25/20  7:45 PM  Result Value Ref Range   Glucose-Capillary 123 (H) 70 -  99 mg/dL    Comment: Glucose reference range applies only to samples taken after fasting for at least 8 hours.  Glucose, capillary     Status: Abnormal   Collection Time: 07/26/20 12:28 AM  Result Value Ref Range   Glucose-Capillary 132 (H) 70 - 99 mg/dL    Comment: Glucose reference range applies only to samples taken after fasting for at least 8 hours.  CBC     Status: Abnormal   Collection Time: 07/26/20  2:38 AM  Result Value Ref Range   WBC 4.5 4.0 - 10.5 K/uL   RBC 4.66 3.87 - 5.11 MIL/uL   Hemoglobin 13.5 12.0 - 15.0 g/dL   HCT 42.9 36 - 46 %   MCV 92.1 80.0 - 100.0 fL   MCH 29.0 26.0 - 34.0 pg   MCHC 31.5 30.0 - 36.0 g/dL   RDW 18.5 (H) 11.5 - 15.5 %   Platelets 127 (L) 150 - 400 K/uL    Comment: REPEATED TO VERIFY   nRBC 0.4 (H) 0.0 - 0.2 %    Comment: Performed at [facility], [place] [address] [place], [place] [address]  Basic metabolic panel     Status: Abnormal   Collection Time: 07/26/20  2:38 AM  Result Value Ref Range   Sodium 147 (H) 135 - 145 mmol/L   Potassium 3.7 3.5 - 5.1 mmol/L   Chloride 117 (H) 98 - 111 mmol/L   CO2 20 (L) 22 - 32 mmol/L   Glucose, Bld 136 (H) 70 - 99 mg/dL    Comment: Glucose reference range applies only to samples taken after fasting for at least 8 hours.   BUN 28 (H) 8 - 23 mg/dL   Creatinine, Ser 1.32 (H) 0.44 - 1.00 mg/dL   Calcium 10.1 8.9 - 10.3 mg/dL   GFR calc non Af Amer 36 (L) >60 mL/min   GFR calc Af Amer 42 (L) >60 mL/min   Anion gap 10 5 - 15    Comment: Performed at [facility] [address] [place], [place] [address]  Magnesium     Status: None   Collection Time: 07/26/20  2:38 AM  Result Value Ref Range   Magnesium 1.9 1.7 - 2.4 mg/dL    Comment: Performed at [facility] [address] [place], [place] [address]  Phosphorus     Status: None   Collection Time: 07/26/20  2:38 AM  Result Value Ref Range   Phosphorus 2.8 2.5 - 4.6 mg/dL    Comment: Performed at [facility]  [address] [place], [place] [address]  Glucose, capillary     Status: Abnormal   Collection Time: 07/26/20  4:05 AM  Result Value Ref Range   Glucose-Capillary 131 (H) 70 - 99 mg/dL    Comment: Glucose reference range applies only to samples taken after fasting for at least 8 hours.  Glucose, capillary     Status: Abnormal   Collection Time: 07/26/20  8:21 AM  Result Value Ref Range   Glucose-Capillary 124 (H) 70 - 99 mg/dL    Comment: Glucose reference range applies only to samples taken after fasting for at least 8 hours.  Glucose, capillary     Status: Abnormal   Collection Time: 07/26/20 12:20 PM  Result Value Ref Range   Glucose-Capillary 120 (H)  70 - 99 mg/dL    Comment: Glucose reference range applies only to samples taken after fasting for at least 8 hours.   Overnight EEG with video  Result Date: 07/25/2020 Lora Havens, MD     07/25/2020  2:00 PM Patient Name:Anae Caffie Damme XLK:[id number] Epilepsy Attending:Priyanka [name] Referring Physician/Provider:Dr Rosalin Hawking Duration: 07/24/2020 1119 to 07/25/2020 1124  Patient history:86 y/o F on Warfarin p/w L frontomedial ICH and another smaller L temporooccipital ICH with associated vasogenic edema.has intermittent but frequent episodes of right facial twitching, right upper extremity tonic extension for by rhythmic jerking.  EEG to evaluate for seizure  Level of alertness:lethargic/ asleep  AEDs during EEG study:Keppra, ativan  Technical aspects: This EEG study was done with scalp electrodes positioned according to the 10-20 International system of electrode placement. Electrical activity was acquired at a sampling rate of 500Hz  and reviewed with a high frequency filter of 70Hz  and a low frequency filter of 1Hz . EEG data were recorded continuously and digitally stored.  Description:Noposterior dominant rhythm was seen.Sleep was characterized by vertex waves, sleep spindles (12 to 14 Hz), maximal frontocentral region. EEG showed  continuous generalized and maximal left parieto-occipital region 3 to 6 Hz theta-delta slowing.  When patient was awake or stimulated, EEG showed rhythmic 2 to 5 Hz theta-delta slowing in left hemisphere, maximal left frontocentral region without any evolution, Sharp waves were seen in left frontal region.  Hyperventilation and photic stimulation were not performed.  ABNORMALITY - Sharp waves, left frontal region -Continuousslow, generalized and maximal left parieto-occipital region  IMPRESSION: This study is suggestive of epileptogenicity in left frontal region as well as cortical dysfunction in left parieto- occipital region likely secondary to underlying hemorrhage. Additionally, there is evidence of moderate diffuse encephalopathy, nonspecific etiology but likely related to sedation,post-ictal state.No seizures ordefiniteepileptiform discharges were seen throughout the recording.  Lora Havens  Medical Problem List and Plan: 1.  Decreased functional mobility with altered mental status secondary to small left frontal medial ICH and left temporal occipital ICH in the setting of chronic Coumadin/likely hypertensive  -patient may shower  -ELOS/Goals: 27-30 days Min A in PT, OT, SLP 2.  Antithrombotics: -DVT/anticoagulation: Subcutaneous heparin for DVT prophylaxis initiated July 25, 2020  -antiplatelet therapy: N/A 3. Pain Management: Tylenol as needed 4. Mood: Ativan 0.5 mg nightly.   7/29: Change Ativan to PRN to minimize oversedation  -antipsychotic agents: N/A 5. Neuropsych: This patient is not capable of making decisions on her own behalf. 6. Skin/Wound Care: Routine skin checks 7. Fluids/Electrolytes/Nutrition: Routine in and outs with follow-up chemistries 8.  Seizure prophylaxis.  Keppra 1000 mg twice daily.  EEG negative 9.  Atrial fibrillation.  Chronic Coumadin reversed.  Cardiac rate controlled.  Continue Lopressor 50 mg twice daily 10.  Dysphagia.  Currently NPO with  nasogastric tube feeds.  Dietary follow-up 11.  Hypertension.  Lasix 20 mg daily, hydralazine 25 mg twice daily, Avapro 150 mg daily, Toprol XL 100 mg daily currently on hold.  Resume as needed  3/66: Diastolic slightly elevated. Continue to monitor.  12.  Hypothyroidism.  Synthroid 13.  Hyperlipidemia.  Statin on hold in the setting of ICH 14.  UTI/E. coli.  Completing course of Rocephin 15.  GERD.  Protonix 16. Disposition: She lives at home with her grandson. She has seven children nearby who can [rovide support upon discharge.   Helyn Numbers, PA-C  I have personally performed a face to face diagnostic evaluation, including, but not limited to relevant history  and physical exam findings, of this patient and developed relevant assessment and plan.  Additionally, I have reviewed and concur with the physician assistant's documentation above.  The patient's status has not changed. The original post admission physician evaluation remains appropriate, and any changes from the pre-admission screening or documentation from the acute chart are noted above.   Leeroy Cha, MD

## 2020-07-26 NOTE — Discharge Summary (Addendum)
Stroke Discharge Summary  Patient ID: Lori Bridges   MRN: [id number]      DOB: 05/16/1930  Date of Admission: 07/22/2020 Date of Discharge: 07/26/2020  Attending Physician:  Rosalin Hawking, MD, Stroke MD Consultant(s):   Leeroy Cha, MD (Physical Medicine & Rehabilitation)   Patient's PCP:  Derinda Late, MD  Discharge Diagnoses:  Principal Problem:   ICH (intracerebral hemorrhage) ([place]) - 2 small L brain ICH, likely d/t HTN on subtherapeutic warfarin Active Problems:   HYPERTENSION, MILD   Permanent atrial fibrillation ([place])   Long term (current) use of anticoagulants   Hyperlipidemia   Chronic diastolic CHF (congestive heart failure) ([place])   Seizures ([place])   Dysphagia due to recent stroke   UTI (urinary tract infection)   CKD (chronic kidney disease), stage IIIa   Hypothyroidism   Medications to be continued on Rehab Allergies as of 07/26/2020      Reactions   Doxycycline Other (See Comments)   UNKNOWN unknown   Ezetimibe Other (See Comments)   Arthralgia, Myalgias   Meclizine Other (See Comments), Palpitations   Tachycardia   Pravastatin Other (See Comments)   Arthralgia, Myalgias   Colestipol Other (See Comments)   Sulfamethoxazole Other (See Comments)   unknown   Ace Inhibitors Cough   Codeine Nausea Only   Colestipol Hcl Other (See Comments)   Malaise   Lovastatin Other (See Comments)   UNKNOWN Unknown   Sulfa Antibiotics Other (See Comments)   unknown      Medication List    STOP taking these medications   furosemide 20 MG tablet Commonly known as: LASIX   hydrALAZINE 25 MG tablet Commonly known as: APRESOLINE   irbesartan 150 MG tablet Commonly known as: AVAPRO   metoprolol succinate 100 MG 24 hr tablet Commonly known as: Toprol XL   metoprolol succinate 25 MG 24 hr tablet Commonly known as: Toprol XL   pantoprazole 40 MG tablet Commonly known as: PROTONIX Replaced by: pantoprazole sodium 40 mg/20 mL Pack   VITAMIN B-12 PO    Vitamin D3 50 MCG (2000 UT) Tabs   warfarin 2 MG tablet Commonly known as: COUMADIN     TAKE these medications   acetaminophen 160 MG/5ML solution Commonly known as: TYLENOL Place 20.3 mLs (650 mg total) into feeding tube every 4 (four) hours as needed for mild pain (or temp > 37.5 C (99.5 F)).   cefTRIAXone 1 g in sodium chloride 0.9 % 100 mL Inject 1 g into the vein daily.   feeding supplement (PROSource TF) liquid Place 45 mLs into feeding tube 2 (two) times daily.   feeding supplement (OSMOLITE 1.2 CAL) Liqd Place 1,000 mLs into feeding tube continuous.   free water Soln Place 150 mLs into feeding tube every 4 (four) hours.   heparin 5000 UNIT/ML injection Inject 1 mL (5,000 Units total) into the skin every 12 (twelve) hours.   levETIRAcetam 100 MG/ML solution Commonly known as: KEPPRA Place 10 mLs (1,000 mg total) into feeding tube 2 (two) times daily.   levothyroxine 50 MCG tablet Commonly known as: Synthroid Place 1 tablet (50 mcg total) into feeding tube daily before breakfast. Start taking on: July 27, 2020 What changed: how to take this   LORazepam 0.5 MG tablet Commonly known as: ATIVAN Place 1 tablet (0.5 mg total) into feeding tube at bedtime. What changed:   medication strength  how to take this   Metoprolol Tartrate 75 MG Tabs Place 75 mg into feeding  tube 2 (two) times daily.   mouth rinse Liqd solution 15 mLs by Mouth Rinse route 2 (two) times daily.   pantoprazole sodium 40 mg/20 mL Pack Commonly known as: PROTONIX Place 20 mLs (40 mg total) into feeding tube daily. Start taking on: July 27, 2020 Replaces: pantoprazole 40 MG tablet   senna-docusate 8.6-50 MG tablet Commonly known as: Senokot-S Place 1 tablet into feeding tube 2 (two) times daily.       LABORATORY STUDIES CBC    Component Value Date/Time   WBC 4.5 07/26/2020 0238   RBC 4.66 07/26/2020 0238   HGB 13.5 07/26/2020 0238   HGB 13.9 06/26/2020 0856   HCT 42.9  07/26/2020 0238   HCT 41.2 06/26/2020 0856   PLT 127 (L) 07/26/2020 0238   PLT 177 06/26/2020 0856   MCV 92.1 07/26/2020 0238   MCV 87 06/26/2020 0856   MCH 29.0 07/26/2020 0238   MCHC 31.5 07/26/2020 0238   RDW 18.5 (H) 07/26/2020 0238   RDW 14.9 06/26/2020 0856   LYMPHSABS 0.8 07/22/2020 1535   MONOABS 0.5 07/22/2020 1535   EOSABS 0.0 07/22/2020 1535   BASOSABS 0.0 07/22/2020 1535   CMP    Component Value Date/Time   NA 147 (H) 07/26/2020 0238   NA 144 01/17/2020 1458   K 3.7 07/26/2020 0238   CL 117 (H) 07/26/2020 0238   CO2 20 (L) 07/26/2020 0238   GLUCOSE 136 (H) 07/26/2020 0238   BUN 28 (H) 07/26/2020 0238   BUN 12 01/17/2020 1458   CREATININE 1.32 (H) 07/26/2020 0238   CALCIUM 10.1 07/26/2020 0238   PROT 7.2 11/26/2016 1328   ALBUMIN 4.0 11/26/2016 1328   AST 26 11/26/2016 1328   ALT 17 11/26/2016 1328   ALKPHOS 112 11/26/2016 1328   BILITOT 1.0 11/26/2016 1328   GFRNONAA 36 (L) 07/26/2020 0238   GFRAA 42 (L) 07/26/2020 0238   COAGS Lab Results  Component Value Date   INR 1.4 (H) 07/24/2020   INR 1.4 (H) 07/23/2020   INR 1.3 (H) 07/23/2020   Lipid Panel    Component Value Date/Time   CHOL 205 (H) 07/23/2020 0200   TRIG 52 07/23/2020 0200   HDL 55 07/23/2020 0200   CHOLHDL 3.7 07/23/2020 0200   VLDL 10 07/23/2020 0200   LDLCALC 140 (H) 07/23/2020 0200   HgbA1C  Lab Results  Component Value Date   HGBA1C 5.7 (H) 07/23/2020   Urinalysis    Component Value Date/Time   COLORURINE AMBER (A) 07/24/2020 1151   APPEARANCEUR HAZY (A) 07/24/2020 1151   LABSPEC 1.035 (H) 07/24/2020 1151   PHURINE 5.0 07/24/2020 1151   GLUCOSEU NEGATIVE 07/24/2020 1151   HGBUR LARGE (A) 07/24/2020 1151   BILIRUBINUR NEGATIVE 07/24/2020 1151   KETONESUR NEGATIVE 07/24/2020 1151   PROTEINUR 100 (A) 07/24/2020 1151   UROBILINOGEN 0.2 04/03/2012 2219   NITRITE NEGATIVE 07/24/2020 1151   LEUKOCYTESUR NEGATIVE 07/24/2020 1151   Alcohol Level    Component Value  Date/Time   ETH <10 07/22/2020 1535    SIGNIFICANT DIAGNOSTIC STUDIES CT ANGIO HEAD W OR WO CONTRAST  Result Date: 07/23/2020 CLINICAL DATA:  Follow-up hemorrhage EXAM: CT ANGIOGRAPHY HEAD TECHNIQUE: Multidetector CT imaging of the head was performed using the standard protocol during bolus administration of intravenous contrast. Multiplanar CT image reconstructions and MIPs were obtained to evaluate the vascular anatomy. CONTRAST:  32mL OMNIPAQUE IOHEXOL 350 MG/ML SOLN COMPARISON:  07/22/2020 FINDINGS: CT HEAD Brain: Slight interval contraction of the hematoma in the  medial left parietal lobe, measuring 23 x 24 x 18 mm compared with 25 x 23 x 19 mm yesterday. Amount of adjacent vasogenic edema appears similar. Small amount of adjacent subarachnoid blood is slightly reduced. No new brain abnormality. Atrophy and chronic small-vessel ischemic change of the white matter elsewhere. No hydrocephalus. Vascular: There is atherosclerotic calcification of the major vessels at the base of the brain. Skull: Negative Sinuses: Clear Orbits: Normal CTA HEAD Anterior circulation: Both internal carotid arteries are patent through the skull base and siphon regions. Ordinary siphon atherosclerotic calcification with maximal stenosis estimated at 30%. The anterior and middle cerebral vessels are patent. Both anterior cerebral is receive most of there supply from the left carotid circulation. Flow is present in the anterior and middle cerebral arteries. No sign of vascular malformation in the region of the left parietal hemorrhage. Additional intraparenchymal hemorrhage in the inferolateral left temporal lobe measuring maximally 11 mm. Minimal surrounding edema. No sign of enhancement or abnormal vessels in that region. Posterior circulation: Both vertebral arteries are patent to the basilar. No basilar stenosis. Posterior circulation branch vessels show flow. Venous sinuses: Early venous phase imaging does not allow accurate  venography. I do not see any finding suggesting venous thrombosis, but this study does not represent a true CT venogram. Anatomic variants: None significant. IMPRESSION: Slight contraction of the left parietal hematoma and adjacent subarachnoid blood. No increase in edema or mass effect. No evidence of enhancing mass or high flow vascular malformation. Diminishing density of a hemorrhage in the left inferolateral temporal lobe, maximal dimension 11 mm. Very small amount of adjacent subarachnoid blood. No enhancement in this region. No new brain finding. Study does not serve as a true CT venogram. No positive evidence of venous thrombosis. This pattern of disease suggests that these may be post traumatic hemorrhages. Electronically Signed   By: Nelson Chimes M.D.   On: 07/23/2020 14:11   CT HEAD WO CONTRAST  Result Date: 07/22/2020 CLINICAL DATA:  Found at 1340 hours by daughter leaning against wall between the bed and a dresser, aphasic, question stroke; history pulmonary hypertension, atrial fibrillation, hypertension, stage III chronic kidney disease, CHF EXAM: CT HEAD WITHOUT CONTRAST TECHNIQUE: Contiguous axial images were obtained from the base of the skull through the vertex without intravenous contrast. Sagittal and coronal MPR images reconstructed from axial data set. COMPARISON:  None FINDINGS: Brain: Generalized atrophy. Normal ventricular morphology. High attenuation acute intraparenchymal hemorrhage identified at LEFT vertex 2.2 x 1.7 x 2.4 cm in size with surrounding vasogenic edema. Extra-axial extension of hemorrhage along the falx and medial sulci to LEFT vertex. No midline shift. Additional intraparenchymal hemorrhage at LEFT temporal lobe 11 x 6 x 10 mm. These could be due to hemorrhagic metastases, less likely hemorrhagic infarct. No additional infarcts, mass, or hemorrhage identified. Vascular: No hyperdense vessels Skull: Intact Sinuses/Orbits: Clear Other: N/A IMPRESSION: Acute  intraparenchymal hemorrhage at LEFT vertex 2.2 x 1.7 x 2.4 cm in size with surrounding vasogenic edema. Extra-axial extension of hemorrhage along the falx and interhemispheric fissure. Additional intraparenchymal hemorrhage at LEFT temporal lobe 11 x 6 x 10 mm. Findings are concerning for hemorrhagic metastases and further assessment by MR imaging with and without contrast recommended. No midline shift or hydrocephalus. Critical Value/emergent results were called by telephone at the time of interpretation on 07/22/2020 at 5:28 pm to provider [facility] , who verbally acknowledged these results. Electronically Signed   By: Lavonia Dana M.D.   On: 07/22/2020 17:30   MR  BRAIN W WO CONTRAST  Result Date: 07/22/2020 CLINICAL DATA:  Multifocal intracranial hemorrhage EXAM: MRI HEAD WITHOUT AND WITH CONTRAST TECHNIQUE: Multiplanar, multiecho pulse sequences of the brain and surrounding structures were obtained without and with intravenous contrast. CONTRAST:  99mL GADAVIST GADOBUTROL 1 MMOL/ML IV SOLN COMPARISON:  Head CT 07/22/2020 Brain MRI 04/30/2012 FINDINGS: Brain: Unchanged intraparenchymal hemorrhage in the left parietal lobe and left temporal lobe. Mild edema at both locations, greater in the parietal lobe. No acute ischemia. No midline shift or other mass effect. Multifocal white matter hyperintensity, most commonly due to chronic ischemic microangiopathy. No hydrocephalus. There is a small amount of subarachnoid blood over the left temporal convexity. No chronic microhemorrhage. Normal midline structures. Postcontrast images are motion degraded there is no appreciable contrast enhancement at the sites of hemorrhage. Vascular: Normal flow voids. Skull and upper cervical spine: Normal marrow signal. Sinuses/Orbits: Negative. Other: None. IMPRESSION: 1. Unchanged intraparenchymal hemorrhage in the left parietal lobe and left temporal lobe with mild edema. 2. No abnormal contrast enhancement at either hemorrhage site  to indicate the presence of a mass. Electronically Signed   By: Ulyses Jarred M.D.   On: 07/22/2020 21:49   EEG adult  Result Date: 07/24/2020 Lora Havens, MD     07/25/2020  2:01 PM Patient Name:Lori Bridges WUG:[id number] Epilepsy Attending:Priyanka [name] Referring Physician/Provider:Dr Rosalin Hawking Date:07/24/2020 Duration: 23.40 mins  Patient history:86 y/o F on Warfarin p/w L frontomedial ICH and another smaller L temporooccipital ICH with associated vasogenic edema.On exam noted to haveR leg and arm twitching, Unable to abduct R eye and adduct Left eye. EEG to evaluate for seizure  Level of alertness:lethargic/ asleep  AEDs during EEG study:Keppra, ativan  Technical aspects: This EEG study was done with scalp electrodes positioned according to the 10-20 International system of electrode placement. Electrical activity was acquired at a sampling rate of 500Hz  and reviewed with a high frequency filter of 70Hz  and a low frequency filter of 1Hz . EEG data were recorded continuously and digitally stored.  Description:Noposterior dominant rhythm was seen.  Sleep was characterized by vertex waves, sleep spindles (12 to 14 Hz), maximal frontocentral region.  EEG showed continuous generalized and maximal left parieto-occipital region 3 to 6 Hz theta-delta slowing. Single sharp transient was seen in left posterior temporal region.  Hyperventilation and photic stimulation were not performed.  ABNORMALITY -Continuousslow, generalized and maximal left parieto-occipital region  IMPRESSION: This study is suggestive of cortical dysfunction in left parietal- occipital region likely secondary to underlying hemorrhage as well as moderate diffuse encephalopathy, nonspecific etiology but likely related to sedation,post-ictal state.No seizures ordefiniteepileptiform discharges were seen throughout the recording.   Lora Havens  EEG adult  Result Date: 07/22/2020 Lora Havens, MD      07/23/2020  9:06 AM Patient Name: Lori Bridges MRN: [id number] Epilepsy Attending: Lora Havens Referring Physician/Provider: Dr Donnetta Simpers Date: 07/22/2020 Duration: 24.02 mins Patient history: 86 y/o F on Warfarin p/w L frontomedial ICH and another smaller L temporooccipital ICH with associated vasogenic edema. On exam noted to have R leg and arm twitching, Unable to abduct R eye and adduct Left eye. EEG to evaluate for seizure Level of alertness: lethargic AEDs during EEG study: Keppra, ativan Technical aspects: This EEG study was done with scalp electrodes positioned according to the 10-20 International system of electrode placement. Electrical activity was acquired at a sampling rate of 500Hz  and reviewed with a high frequency filter of 70Hz  and a low frequency filter  of 1Hz . EEG data were recorded continuously and digitally stored. Description: No posterior dominant rhythm was seen. EEG showed continuous generalized 3 to 6 Hz theta-delta slowing admixed with15 to 18 Hz beta activity with irregular morphology distributed symmetrically and diffusely.  Hyperventilation and photic stimulation were not performed.   ABNORMALITY -Excessive beta, generalized -Continuous slow, generalized IMPRESSION: This study is suggestive of moderate diffuse encephalopathy, nonspecific etiology but likely related to sedation, post-ictal state. The excessive beta activity seen in the background is most likely due to the effect of benzodiazepine and is a benign EEG pattern. No seizures or definite epileptiform discharges were seen throughout the recording. Priyanka [name]   Overnight EEG with video  Result Date: 07/25/2020 Lora Havens, MD     07/25/2020  2:00 PM Patient Name:Lori Bridges AST:[id number] Epilepsy Attending:Priyanka [name] Referring Physician/Provider:Dr Rosalin Hawking Duration: 07/24/2020 1119 to 07/25/2020 1124  Patient history:86 y/o F on Warfarin p/w L frontomedial ICH and another smaller L  temporooccipital ICH with associated vasogenic edema.has intermittent but frequent episodes of right facial twitching, right upper extremity tonic extension for by rhythmic jerking.  EEG to evaluate for seizure  Level of alertness:lethargic/ asleep  AEDs during EEG study:Keppra, ativan  Technical aspects: This EEG study was done with scalp electrodes positioned according to the 10-20 International system of electrode placement. Electrical activity was acquired at a sampling rate of 500Hz  and reviewed with a high frequency filter of 70Hz  and a low frequency filter of 1Hz . EEG data were recorded continuously and digitally stored.  Description:Noposterior dominant rhythm was seen.Sleep was characterized by vertex waves, sleep spindles (12 to 14 Hz), maximal frontocentral region. EEG showed continuous generalized and maximal left parieto-occipital region 3 to 6 Hz theta-delta slowing.  When patient was awake or stimulated, EEG showed rhythmic 2 to 5 Hz theta-delta slowing in left hemisphere, maximal left frontocentral region without any evolution, Sharp waves were seen in left frontal region.  Hyperventilation and photic stimulation were not performed.  ABNORMALITY - Sharp waves, left frontal region -Continuousslow, generalized and maximal left parieto-occipital region  IMPRESSION: This study is suggestive of epileptogenicity in left frontal region as well as cortical dysfunction in left parieto- occipital region likely secondary to underlying hemorrhage. Additionally, there is evidence of moderate diffuse encephalopathy, nonspecific etiology but likely related to sedation,post-ictal state.No seizures ordefiniteepileptiform discharges were seen throughout the recording.  Priyanka [name]  Overnight EEG with video  Result Date: 07/23/2020 Lora Havens, MD     07/25/2020 11:02 AM Patient Name: ALENA BLANKENBECKLER MRN: [id number] Epilepsy Attending: Lora Havens Referring Physician/Provider: Dr  Donnetta Simpers Duration: 07/22/2020 2303 to 07/23/2020 1054  Patient history: 86 y/o F on Warfarin p/w L frontomedial ICH and another smaller L temporooccipital ICH with associated vasogenic edema.On exam noted to have R leg and arm twitching, Unable to abduct R eye and adduct Left eye. EEG to evaluate for seizure  Level of alertness: lethargic, asleep  AEDs during EEG study: Keppra, ativan  Technical aspects: This EEG study was done with scalp electrodes positioned according to the 10-20 International system of electrode placement. Electrical activity was acquired at a sampling rate of 500Hz  and reviewed with a high frequency filter of 70Hz  and a low frequency filter of 1Hz . EEG data were recorded continuously and digitally stored.  Description: No posterior dominant rhythm was seen.   Sleep was characterized by vertex waves, sleep spindles (12 to 14 Hz), maximal frontocentral region.  EEG showed continuous generalized 3  to 6 Hz theta-delta slowing admixed with15 to 18 Hz beta activity with irregular morphology distributed symmetrically and diffusely.  Hyperventilation and photic stimulation were not performed.  ABNORMALITY -Excessive beta, generalized -Continuous slow, generalized  IMPRESSION: This study is suggestive of moderate diffuse encephalopathy, nonspecific etiology but likely related to sedation, post-ictal state. The excessive beta activity seen in the background is most likely due to the effect of benzodiazepine and is a benign EEG pattern. No seizures or definite epileptiform discharges were seen throughout the recording.   Priyanka O Yadav   VAS US CAROTID  Result Date: 07/23/2020 Carotid Arterial Duplex Study Indications:       CVA. Risk Factors:      Hypertension, hyperlipidemia. Limitations        Today's exam was limited due to the patient's respiratory                    variation and patient positioning. Comparison Study:  no prior Performing Technologist: Abram Sander RVS   Examination Guidelines: A complete evaluation includes B-mode imaging, spectral Doppler, color Doppler, and power Doppler as needed of all accessible portions of each vessel. Bilateral testing is considered an integral part of a complete examination. Limited examinations for reoccurring indications may be performed as noted.  Right Carotid Findings: +----------+--------+--------+--------+------------------+--------------+           PSV cm/sEDV cm/sStenosisPlaque DescriptionComments       +----------+--------+--------+--------+------------------+--------------+ CCA Prox  34      7               heterogenous                     +----------+--------+--------+--------+------------------+--------------+ CCA Distal26      4               heterogenous                     +----------+--------+--------+--------+------------------+--------------+ ICA Prox  27      6       1-39%   heterogenous                     +----------+--------+--------+--------+------------------+--------------+ ICA Distal33      8                                                +----------+--------+--------+--------+------------------+--------------+ ECA                                                 Not visualized +----------+--------+--------+--------+------------------+--------------+ +----------+--------+-------+--------+-------------------+           PSV cm/sEDV cmsDescribeArm Pressure (mmHG) +----------+--------+-------+--------+-------------------+ [id number]                                         +----------+--------+-------+--------+-------------------+ +---------+--------+--------+--------------+ VertebralPSV cm/sEDV cm/sNot identified +---------+--------+--------+--------------+  Left Carotid Findings: +----------+--------+--------+--------+------------------+--------+           PSV cm/sEDV cm/sStenosisPlaque DescriptionComments  +----------+--------+--------+--------+------------------+--------+ CCA Prox  49      9               heterogenous               +----------+--------+--------+--------+------------------+--------+  CCA Distal40      11              heterogenous               +----------+--------+--------+--------+------------------+--------+ ICA Prox  35      9       1-39%   heterogenous               +----------+--------+--------+--------+------------------+--------+ ICA Distal54      14                                         +----------+--------+--------+--------+------------------+--------+ ECA       25                                                 +----------+--------+--------+--------+------------------+--------+ +----------+--------+--------+--------+-------------------+           PSV cm/sEDV cm/sDescribeArm Pressure (mmHG) +----------+--------+--------+--------+-------------------+ [id number]                                          +----------+--------+--------+--------+-------------------+ +---------+--------+--+--------+-+---------+ VertebralPSV cm/s30EDV cm/s8Antegrade +---------+--------+--+--------+-+---------+   Summary: Right Carotid: Velocities in the right ICA are consistent with a 1-39% stenosis. Left Carotid: Velocities in the left ICA are consistent with a 1-39% stenosis. Vertebrals: Left vertebral artery demonstrates antegrade flow. Right vertebral             artery was not visualized. *See table(s) above for measurements and observations.  Electronically signed by Antony Contras MD on 07/23/2020 at 12:48:50 PM.    Final        HISTORY OF PRESENT ILLNESS Lori Bridges is an 86 y.o. female with history of atrial fibrillation on coumadin, hypertension, hyperlipidemia, hypothyroidism and chronic kidney disease. Baseline she lives at home and her grandson lives with her. She has 7 children that live in the area and check on her regularly. She is able to  ambulate with a walker. She is able to dress and bathe herself and prepare meals. Her daughter reports that she has mild issues with memory and within the past couple of months her daughter has started to manage her finances. Modified Rankin: Rankin Score=3  She was last known well by another daughter around 8:50am today 07/22/2020. She then took a bath and was able to dress herself. When her daughter present arrived around 1PM she noticed that the patient was confused and having difficulty understanding speech. She did not notice any weakness or seizure like activity at that time. CT showed a LEFT vertex IPH w/  surrounding vasogenic edema and extra-axial extension AND L temporal lobe IPH. concerning for hemorrhagic metastases. Following image results Lori Bridges was ordered by the ED Provider. Last dose of coumadin was the night prior around 6:30PM.   Neurology was consulted and upon arrival to the room the patient was having twitching of her right leg and arm, and was felt to be having seizure activity secondary to the intraparenchymal hemorrhage. Keppra and ativan were ordered for seizure activity. Decadron was also ordered to help with edema due to concern the hemorrhage was secondary to metastasis. She remained alert with the seizure activity and was able to answer some simple  questions, but appeared to be having some difficulty following conversation and appropriately following all commands. She was asked to lift her leg and she lifted her arm and said that it was her leg. She was able to lift all extremities, however the right side was weaker. She also had difficulty fully tracking to the right side of her visual field. Intracerebral Hemorrhage (ICH) Score: 2   HOSPITAL COURSE Ms. Lori Bridges is a 86 y.o. female with history of atrial fibrillation on coumadin, hypertension, hyperlipidemia, hypothyroidism and chronic kidney disease presenting with speech difficulty, confusion.   Stroke:  Small L  frontomedial ICH and L temporooccipital ICH in pt w/ chronic AF on warfarin (subtherapeutic), likely hypertensive   CT head L vertex ICH w/ vasogenic edema. Extra axial extenion along falx and interhemispheric fissure, L temporal lobe hemorrhage. Concern for mets.  Warfarin reversal w/ Kcentra, Vit K  Treated w/ decadron d/t possible mets  MRI w/w/o  Unchanged L parietal and L temporal lobe hemorrhage w/ mild edema. No mass. No clear CAA  CTA head no AVM, stable hematoma  Carotid doppler unremarkable  Recent 2D Echo EF 60-65%. AF. LA severely dilated.  LDL 140  HgbA1c 5.7  VTE prophylaxis - SCDs   warfarin daily prior to admission, now on No antithrombotic given hemorrhage.   Therapy recommendations:  CIR  Disposition:  CIR  Seizure  7/25 R leg and arm twitching in ED -> Ativan 2 x 1 -> Loaded w/ Keppra 1500 -> 500 bid  EEG  Excessive beta, generalized slowing  LT EEG 7/26 no seizure  7/27 intermittent but frequent episodes of right facial twitching, right UE tonic extension, raising up into air, followed by rhythmic jerking for 1 min or so then evolving into subtle hand and forearm jerking -> ativan 2mg  x1 ->increase keppra to 1000 bid  Stat EEG Continuousslow, generalizedand maximal left parieto-occipital region  LT EEG 7/28 epileptogenicity in left frontal region, no seizure  PAF with RVR  Home anticoagulation:  warfarin daily   INR on arrival 1.6 ->1.5->1.4->1.4   S/p Kcentra, Vit K  Future antithrombotic options will be discussed at outpatient follow-up  HR 90s-100s, metoprolol 50mg ->75mg  bid  Hypertension  Home meds:  Lasix 20, hydralazine 25, irbesartan 150, metoprolol 100 am + 25 pm  BP 150s on admission   Increased metoprolol to 75 twice daily at d/c   BP stable  SBP goal < 160  Long-term BP goal normotensive  Hyperlipidemia  Home meds:  No statin  Intolerant to multiple statins and zetia  LDL 140  Hold statin in setting of  ICH and hx intolerance  Dysphagia hypernatremia  Secondary to stroke  NPO  Speech on board  On IVF @ 10  Cortrak placed, TF @ 94   Free water 150 q 4h   UTI  UA w/ many bacteria, 6-10 WBC    Rocephin 7/27>>  UCx E.Coli  Other Stroke Risk Factors  Advanced age  Other Active Problems  CKD stage IIIa, Cre 1.22->1.28->1.24->1.32 - add free water  Hypothyroid on synthroid PTA  Hypokalemia 3.4->3.1- Supplement -> 3.7    DISCHARGE EXAM Blood pressure (!) 118/92, pulse 104, temperature 97.6 F (36.4 C), temperature source Axillary, resp. rate 14, weight 69.4 kg, SpO2 97 %. General - Well nourished, well developed, lethargic and sleepy after working with PT/OT.  Ophthalmologic - fundi not visualized due to noncooperation.  Cardiovascular - irregularly irregular heart rate and rhythm  Neuro - sleepy drowsy, eyes closed but  able to open with repetitive stimulation, able to say her name then perseverated on her name, not orientated to age, time or people, mumbling words intangible. Follows very limited midline commands, but no peripheral commands. Not blinking to visual threat bilaterally but tracking bilaterally. No gaze palsy, facial symmetrical, tongue midline. LUE and LLE at least 3/5, RUE drift to bed before 10 seconds. RLE 2+/5 withdraw to pain. Sensation subjectively symmetrical. FTN not cooperative and gait not tested.   Discharge Diet  NPO, on tube feedings  DISCHARGE PLAN  Disposition:  Transfer to [place] for ongoing PT, OT and ST  Due to hemorrhage and risk of bleeding, do not take aspirin, aspirin-containing medications, or ibuprofen products at this time. Will consider antithrombotic at time of follow up with neurology.   Recommend ongoing stroke risk factor control by Primary Care Physician at time of discharge from inpatient rehabilitation.  Follow-up PCP Derinda Late, MD in 2 weeks following discharge from  rehab.  Follow-up in [facility] in 4 weeks following discharge from rehab, office to schedule an appointment.   35 minutes were spent preparing discharge.  Rosalin Hawking, MD PhD Stroke Neurology 07/26/2020 4:48 PM

## 2020-07-27 ENCOUNTER — Inpatient Hospital Stay (HOSPITAL_COMMUNITY): Payer: Medicare Other

## 2020-07-27 ENCOUNTER — Inpatient Hospital Stay (HOSPITAL_COMMUNITY): Payer: Medicare Other | Admitting: Physical Therapy

## 2020-07-27 ENCOUNTER — Inpatient Hospital Stay (HOSPITAL_COMMUNITY): Payer: Medicare Other | Admitting: Occupational Therapy

## 2020-07-27 ENCOUNTER — Inpatient Hospital Stay (HOSPITAL_COMMUNITY): Payer: Medicare Other | Admitting: Speech Pathology

## 2020-07-27 DIAGNOSIS — I611 Nontraumatic intracerebral hemorrhage in hemisphere, cortical: Secondary | ICD-10-CM | POA: Diagnosis not present

## 2020-07-27 LAB — CBC WITH DIFFERENTIAL/PLATELET
Abs Immature Granulocytes: 0.01 10*3/uL (ref 0.00–0.07)
Basophils Absolute: 0 10*3/uL (ref 0.0–0.1)
Basophils Relative: 0 %
Eosinophils Absolute: 0 10*3/uL (ref 0.0–0.5)
Eosinophils Relative: 1 %
HCT: 43.9 % (ref 36.0–46.0)
Hemoglobin: 13.6 g/dL (ref 12.0–15.0)
Immature Granulocytes: 0 %
Lymphocytes Relative: 11 %
Lymphs Abs: 0.4 10*3/uL — ABNORMAL LOW (ref 0.7–4.0)
MCH: 28.9 pg (ref 26.0–34.0)
MCHC: 31 g/dL (ref 30.0–36.0)
MCV: 93.4 fL (ref 80.0–100.0)
Monocytes Absolute: 0.5 10*3/uL (ref 0.1–1.0)
Monocytes Relative: 12 %
Neutro Abs: 3 10*3/uL (ref 1.7–7.7)
Neutrophils Relative %: 76 %
Platelets: 133 10*3/uL — ABNORMAL LOW (ref 150–400)
RBC: 4.7 MIL/uL (ref 3.87–5.11)
RDW: 19 % — ABNORMAL HIGH (ref 11.5–15.5)
WBC: 3.9 10*3/uL — ABNORMAL LOW (ref 4.0–10.5)
nRBC: 0.8 % — ABNORMAL HIGH (ref 0.0–0.2)

## 2020-07-27 LAB — COMPREHENSIVE METABOLIC PANEL
ALT: 16 U/L (ref 0–44)
AST: 26 U/L (ref 15–41)
Albumin: 3 g/dL — ABNORMAL LOW (ref 3.5–5.0)
Alkaline Phosphatase: 102 U/L (ref 38–126)
Anion gap: 8 (ref 5–15)
BUN: 31 mg/dL — ABNORMAL HIGH (ref 8–23)
CO2: 23 mmol/L (ref 22–32)
Calcium: 10.1 mg/dL (ref 8.9–10.3)
Chloride: 115 mmol/L — ABNORMAL HIGH (ref 98–111)
Creatinine, Ser: 1.21 mg/dL — ABNORMAL HIGH (ref 0.44–1.00)
GFR calc Af Amer: 46 mL/min — ABNORMAL LOW (ref >60)
GFR calc non Af Amer: 40 mL/min — ABNORMAL LOW (ref >60)
Glucose, Bld: 136 mg/dL — ABNORMAL HIGH (ref 70–99)
Potassium: 4.1 mmol/L (ref 3.5–5.1)
Sodium: 146 mmol/L — ABNORMAL HIGH (ref 135–145)
Total Bilirubin: 1.9 mg/dL — ABNORMAL HIGH (ref 0.3–1.2)
Total Protein: 5.6 g/dL — ABNORMAL LOW (ref 6.5–8.1)

## 2020-07-27 LAB — GLUCOSE, CAPILLARY: Glucose-Capillary: 86 mg/dL (ref 70–99)

## 2020-07-27 MED ORDER — PROSOURCE TF PO LIQD
45.0000 mL | Freq: Every day | ORAL | Status: DC
  Administered 2020-07-28 – 2020-08-06 (×10): 45 mL
  Filled 2020-07-27 (×11): qty 45

## 2020-07-27 MED ORDER — METOPROLOL TARTRATE 25 MG PO TABS
25.0000 mg | ORAL_TABLET | Freq: Once | ORAL | Status: DC
  Filled 2020-07-27: qty 1

## 2020-07-27 MED ORDER — OSMOLITE 1.2 CAL PO LIQD
1300.0000 mL | ORAL | Status: DC
  Administered 2020-07-27: 1300 mL
  Administered 2020-07-28 – 2020-07-29 (×2): 1000 mL
  Administered 2020-07-30 – 2020-07-31 (×2): 1300 mL
  Filled 2020-07-27 (×7): qty 2000

## 2020-07-27 MED ORDER — FREE WATER
150.0000 mL | Status: DC
  Administered 2020-07-27 – 2020-08-02 (×39): 150 mL

## 2020-07-27 NOTE — Progress Notes (Signed)
Mittens were applied to patient's hand to prevent patient from pulling out NGT. Upon entering room NGT was out of patients nose. Pt is calm, not showing any signs of distress but pt is confused. NP made aware via telephone. Orders received to replace NGT, ABD xray ordered for NGT verification.

## 2020-07-27 NOTE — Evaluation (Signed)
Occupational Therapy Assessment and Plan  Patient Details  Name: Lori Bridges MRN: [id number] Date of Birth: Jan 12, 1930  OT Diagnosis: abnormal posture, cognitive deficits, disturbance of vision, hemiplegia affecting dominant side, muscular wasting and disuse atrophy and muscle weakness (generalized) Rehab Potential: Rehab Potential (ACUTE ONLY): Fair ELOS: 28-30 days   Today's Date: 07/27/2020 OT Individual Time: 1400-1500 OT Individual Time Calculation (min): 60 min     Hospital Problem: Principal Problem:   ICH (intracerebral hemorrhage) ([place]) - 2 small L brain ICH, likely d/t HTN on subtherapeutic warfarin   Past Medical History:  Past Medical History:  Diagnosis Date  . Adenomatous colon polyp 2005  . Anxiety age 50  . Carotid artery bruit 2001   left  . CKD (chronic kidney disease), stage III   . DDD (degenerative disc disease)   . DJD (degenerative joint disease)   . Gastroenteritis 2011  . GERD (gastroesophageal reflux disease) 2003  . History of pneumonia   . HTN (hypertension) 1983  . Hyperlipidemia 2000  . Hypothyroidism 2009  . Iron deficiency anemia 1983  . Osteoarthritis 2001  . Osteoporosis 2001  . Permanent atrial fibrillation ([place]) 07/23/2010       . Persistent headaches 1987  . Pulmonary HTN ([place]) 04/03/2017   Moderate with PASP 29mHg by echo 02/2017  . Tricuspid regurgitation    severe by echo 02/25/12  . Vertigo   . Vitamin D deficiency 2011   Past Surgical History:  Past Surgical History:  Procedure Laterality Date  . COLONOSCOPY W/ BIOPSIES AND POLYPECTOMY  09/16/2004   adenomatous polyp, diverticulosis, internal hemorrhoids  . s/p BLT    . TUBAL LIGATION      Assessment & Plan Clinical Impression:  MShonica Bridges SReekis a 86year old right-handed female history of atrial fibrillation maintained on Coumadin, hypertension, hyperlipidemia, chronic kidney disease.  Per chart review lives with grandson.  Independent with assistive device was able to  dress and bathe herself although family notes some recent decrease in memory.  She has 7 children in the area with good support.  Presented July 23, 2020 with increasing confusion as well as slurred speech and subtle hand and forearm twitching..  CT/MRI showed acute intraparenchymal hemorrhage in the left vertex 2.2 x 1.7 x 2.4 cm in size with surrounding vasogenic edema.  Additional intraparenchymal hemorrhage in the left temporal lobe 11 x 6 x 10 mm.  No midline shift or hydrocephalus.  CT angiogram of head no evidence of enhancing mass or lesion.  Admission chemistries with potassium 3.2, glucose 126, creatinine 1.22 alcohol negative SARS coronavirus negative, INR 1.5.  She was loaded with Keppra and EEG negative for seizure.  Patient did receive Kcentra/vitamin K to reverse Coumadin.  Cardene drip initially for blood pressure controlled.  She was cleared to begin subcutaneous heparin for DVT prophylaxis July 25, 2020.  She remains n.p.o. with alternative means of nutritional support.  Patient with findings of E. coli urinary tract infection completing course of Rocephin.  Therapy evaluations completed and patient was admitted for a comprehensive rehab program.     Patient transferred to CIR on 07/26/2020 .    Patient currently requires total with basic self-care skills secondary to muscle weakness, decreased cardiorespiratoy endurance, abnormal tone, unbalanced muscle activation, motor apraxia, decreased coordination and decreased motor planning, decreased visual acuity, decreased visual perceptual skills and decreased visual motor skills, decreased attention to right, right side neglect and decreased motor planning, decreased initiation, decreased attention, decreased awareness, decreased problem solving, decreased  safety awareness, decreased memory and delayed processing and decreased sitting balance, decreased standing balance, decreased postural control, hemiplegia and decreased balance strategies.  Prior  to hospitalization, patient was independent with her self care and mobility skills.  Patient will benefit from skilled intervention to increase independence with basic self-care skills prior to discharge home with care partner.  Anticipate patient will require moderate physical assestance and follow up home health.  OT - End of Session Endurance Deficit: Yes Endurance Deficit Description: pt kept eyes closed during session, she opened answered July for when her birthday was and said "what!" when her daughter asked her a question OT Assessment Rehab Potential (ACUTE ONLY): Fair OT Barriers to Discharge: Incontinence OT Patient demonstrates impairments in the following area(s): Balance;Cognition;Endurance;Perception;Pain;Motor;Sensory;Vision OT Basic ADL's Functional Problem(s): Eating;Grooming;Bathing;Dressing;Toileting OT Transfers Functional Problem(s): Toilet OT Additional Impairment(s): Fuctional Use of Upper Extremity OT Plan OT Intensity: Minimum of 1-2 x/day, 45 to 90 minutes OT Frequency: 5 out of 7 days OT Duration/Estimated Length of Stay: 28-30 days OT Treatment/Interventions: Balance/vestibular training;Cognitive remediation/compensation;Discharge planning;DME/adaptive equipment instruction;Functional mobility training;Neuromuscular re-education;Psychosocial support;Pain management;Patient/family education;Self Care/advanced ADL retraining;Therapeutic Activities;Therapeutic Exercise;UE/LE Strength taining/ROM;UE/LE Coordination activities;Visual/perceptual remediation/compensation OT Self Feeding Anticipated Outcome(s): mod A OT Basic Self-Care Anticipated Outcome(s): mod A OT Toileting Anticipated Outcome(s): max A OT Bathroom Transfers Anticipated Outcome(s): mod A to toilet OT Recommendation Patient destination: Home Follow Up Recommendations: Home health OT Equipment Recommended: 3 in 1 bedside comode   OT Evaluation Precautions/Restrictions  Precautions Precautions:  Fall  Therapy Vitals Temp: 99.1 F (37.3 C) Temp Source: Oral Pulse Rate: 60 Resp: 17 BP: 127/79 Patient Position (if appropriate): Sitting Oxygen Therapy SpO2: 98 % O2 Device: Room Air Pain Pain Assessment Faces Pain Scale: No hurt (grimaces when passively turning her head to the R) Home Living/Prior Functioning Home Living Available Help at Discharge: Family, Available 24 hours/day Type of Home: House Home Access: Stairs to enter Technical brewer of Steps: 1 STE Entrance Stairs-Rails: None Home Layout: One level  Lives With: Family Prior Function Level of Independence: Independent with transfers, Independent with gait, Independent with basic ADLs, Independent with homemaking with ambulation, Other (comment) (Per daughter, pt ambulates household distances indep with no AD, ambulates short community distances mod I with RW vs no AD, indep with ADLs. Does not drive, family assists with transportation and medications) Driving: No Vocation: Retired Surveyor, mining Baseline Vision/History: Wears glasses Wears Glasses: At all times Vision Assessment?:  (not able to assess) Additional Comments: unable to assess due to lethargy Perception  Perception: Not tested Praxis Praxis: Not tested Cognition Overall Cognitive Status: Impaired/Different from baseline Arousal/Alertness: Lethargic Orientation Level: Nonverbal/unable to assess Memory: Impaired Memory Impairment: Storage deficit Immediate Memory Recall:  (unable to assess, pt lethargic and not communicating) Attention: Sustained Sustained Attention: Impaired Sustained Attention Impairment: Verbal basic;Functional basic Awareness: Impaired Awareness Impairment: Intellectual impairment Problem Solving: Impaired Problem Solving Impairment: Verbal basic;Functional basic Safety/Judgment: Impaired Sensation Sensation Hot/Cold: Not tested Proprioception: Not tested Stereognosis: Not tested Motor  Motor Motor:  Hemiplegia;Abnormal postural alignment and control Motor - Skilled Clinical Observations: unable to fully assess due to lethargy  Trunk/Postural Assessment  Postural Control Head Control: unable to fully assess due to lethargy Trunk Control: unable to fully assess due to lethargy Righting Reactions: unable to fully assess due to lethargy  Balance Dynamic Sitting Balance Sitting balance - Comments: unable to fully assess due to lethargy Extremity/Trunk Assessment RUE Assessment Active Range of Motion (AROM) Comments: unable to fully assess due to lethargy General Strength Comments: Pt mostly  nonparticipatory when prompted with cues. Able to perform weak grip. LUE Assessment Active Range of Motion (AROM) Comments: unable to fully assess due to lethargy General Strength Comments: Pt mostly nonparticipatory when prompted with cues. Able to perform weak grip.  Care Tool Care Tool Self Care Eating Eating activity did not occur: Safety/medical concerns (NPO)      Oral Care  Oral care, brush teeth, clean dentures activity did not occur: Safety/medical concerns (NPO)      Bathing Bathing activity did not occur:  (did not occur on eval, as pt was not able to be aroused awake)            Upper Body Dressing(including orthotics) Upper body dressing/undressing activity did not occur (including orthotics):  (did not occur on eval, as pt was not able to be aroused awake) What is the patient wearing?: Hospital gown only   Assist Level: Dependent - Patient 0%    Lower Body Dressing (excluding footwear)   What is the patient wearing?: Incontinence brief Assist for lower body dressing: Dependent - Patient 0%    Putting on/Taking off footwear   What is the patient wearing?: Non-skid slipper socks Assist for footwear: Dependent - Patient 0%       Care Tool Toileting Toileting activity   Assist for toileting: 2 Helpers (incontinent of bowel)     Care Tool Bed Mobility Roll left and  right activity   Roll left and right assist level: 2 Helpers    Sit to lying activity   Sit to lying assist level: 2 Helpers    Lying to sitting edge of bed activity   Lying to sitting edge of bed assist level: 2 Helpers     Care Tool Transfers Sit to stand transfer        Chair/bed transfer Chair/bed transfer activity did not occur: Safety/medical concerns       Toilet transfer Toilet transfer activity did not occur: Safety/medical concerns (pt not alert enough today)       Care Tool Cognition Expression of Ideas and Wants Expression of Ideas and Wants: Rarely/Never expressess or very difficult - rarely/never expresses self or speech is very difficult to understand   Understanding Verbal and Non-Verbal Content Understanding Verbal and Non-Verbal Content: Rarely/never understands   Memory/Recall Ability *first 3 days only  unable to verbalize    Refer to Care Plan for Long Term Goals  SHORT TERM GOAL WEEK 1 OT Short Term Goal 1 (Week 1): Pt will demonstrate alertness to engage in UB bathing with max A. OT Short Term Goal 2 (Week 1): Pt will follow 1 step commands with 50 % accuracy with donning UB clothing with max A. OT Short Term Goal 3 (Week 1): Pt will sit to EOB with max a. OT Short Term Goal 4 (Week 1): Pt will maintain static sitting for 5 min at EOB with mod A to prepare her for sitting on a BSC.  Recommendations for other services: None    Skilled Therapeutic Intervention Pt seen for initial evaluation which was very limited due to pt being lethargic, not opening her eyes. When pt was being moved (pt was incontinent of bowel) and being rolled side to side, she somewhat resisted the movements. Tried to engage pt in moving hands together by clasping hands but pt kept pulling back.  [name] present.  Further assessment will be ongoing as pt is able to participate more. Informed daughter what therapy team was thinking of for LOS and goals  based on what we observed  today.  Goals can be upgraded as pt progresses.  Pt resting in bed with RN in room with pt.      Discharge Criteria: Patient will be discharged from OT if patient refuses treatment 3 consecutive times without medical reason, if treatment goals not met, if there is a change in medical status, if patient makes no progress towards goals or if patient is discharged from hospital.  The above assessment, treatment plan, treatment alternatives and goals were discussed and mutually agreed upon: by family  Saray Capasso 07/27/2020, 4:51 PM

## 2020-07-27 NOTE — Progress Notes (Signed)
Physical Therapy Assessment and Plan  Patient Details  Name: Lori Bridges MRN: [id number] Date of Birth: 1930-11-22  PT Diagnosis: Abnormal posture, Cognitive deficits, Hemiparesis non-dominant, Impaired cognition, Impaired sensation and Muscle weakness Rehab Potential: Fair ELOS: 3 weeks   Today's Date: 07/27/2020 PT Individual Time: 1030-1130 PT Individual Time Calculation (min): 60 min    Hospital Problem: Principal Problem:   ICH (intracerebral hemorrhage) ([place]) - 2 small L brain ICH, likely d/t HTN on subtherapeutic warfarin   Past Medical History:  Past Medical History:  Diagnosis Date  . Adenomatous colon polyp 2005  . Anxiety age 43  . Carotid artery bruit 2001   left  . CKD (chronic kidney disease), stage III   . DDD (degenerative disc disease)   . DJD (degenerative joint disease)   . Gastroenteritis 2011  . GERD (gastroesophageal reflux disease) 2003  . History of pneumonia   . HTN (hypertension) 1983  . Hyperlipidemia 2000  . Hypothyroidism 2009  . Iron deficiency anemia 1983  . Osteoarthritis 2001  . Osteoporosis 2001  . Permanent atrial fibrillation ([place]) 07/23/2010       . Persistent headaches 1987  . Pulmonary HTN ([place]) 04/03/2017   Moderate with PASP 51mHg by echo 02/2017  . Tricuspid regurgitation    severe by echo 02/25/12  . Vertigo   . Vitamin D deficiency 2011   Past Surgical History:  Past Surgical History:  Procedure Laterality Date  . COLONOSCOPY W/ BIOPSIES AND POLYPECTOMY  09/16/2004   adenomatous polyp, diverticulosis, internal hemorrhoids  . s/p BLT    . TUBAL LIGATION      Assessment & Plan Clinical Impression: Patient is a 86y.o. right-handed female history of atrial fibrillation maintained on Coumadin, hypertension, hyperlipidemia, chronic kidney disease.  Per chart review lives with grandson.  Independent with assistive device was able to dress and bathe herself although family notes some recent decrease in memory.  She has 7  children in the area with good support.  Presented July 23, 2020 with increasing confusion as well as slurred speech and subtle hand and forearm twitching..  CT/MRI showed acute intraparenchymal hemorrhage in the left vertex 2.2 x 1.7 x 2.4 cm in size with surrounding vasogenic edema.  Additional intraparenchymal hemorrhage in the left temporal lobe 11 x 6 x 10 mm.  No midline shift or hydrocephalus.  CT angiogram of head no evidence of enhancing mass or lesion.  Admission chemistries with potassium 3.2, glucose 126, creatinine 1.22 alcohol negative SARS coronavirus negative, INR 1.5.  She was loaded with Keppra and EEG negative for seizure.  Patient did receive Kcentra/vitamin K to reverse Coumadin.  Cardene drip initially for blood pressure controlled.  She was cleared to begin subcutaneous heparin for DVT prophylaxis July 25, 2020.  She remains n.p.o. with alternative means of nutritional support.  Patient with findings of E. coli urinary tract infection completing course of Rocephin.  Therapy evaluations completed and patient was admitted for a comprehensive rehab program.  Patient currently requires totalA +2 for bed mobility, maxA +1 for sitting balance, maxA +2 for sit<>stand transfers with mobility secondary to muscle weakness, impaired timing and sequencing, unbalanced muscle activation, decreased coordination and decreased motor planning and decreased initiation, decreased attention, decreased awareness, decreased problem solving, decreased safety awareness, decreased memory and delayed processing.  Prior to hospitalization, patient was modified independent  with mobility and lived with her grown nephew in a 1 lvl home with 1 STE. Nephew can provide 24/7 S/A and there are  7 children who can assist PRN.   Patient will benefit from skilled PT intervention to maximize safe functional mobility, minimize fall risk and decrease caregiver burden for planned discharge SNF vs home with strict 24/7 S/A and HHPT.     PT - End of Session Activity Tolerance: Tolerates < 10 min activity, no significant change in vital signs Endurance Deficit: Yes Endurance Deficit Description: Pt with eyes closed for majority of session, alertness improves once seated EOB, PT Assessment Rehab Potential (ACUTE/IP ONLY): Fair PT Barriers to Discharge: [place] home environment;New oxygen;Home environment access/layout;Lack of/limited family support PT Patient demonstrates impairments in the following area(s): Balance;Behavior;Endurance;Motor;Safety;Sensory PT Transfers Functional Problem(s): Bed Mobility;Bed to Chair;Car PT Locomotion Functional Problem(s): Ambulation;Wheelchair Mobility;Stairs PT Plan PT Intensity: Minimum of 1-2 x/day ,45 to 90 minutes PT Frequency: 5 out of 7 days PT Duration Estimated Length of Stay: 3 weeks PT Treatment/Interventions: Ambulation/gait training;Balance/vestibular training;Cognitive remediation/compensation;DME/adaptive equipment instruction;Functional electrical stimulation;Patient/family education;Community reintegration;Pain management;Splinting/orthotics;UE/LE Strength taining/ROM;UE/LE Coordination activities;Therapeutic Exercise;Wheelchair propulsion/positioning;Visual/perceptual remediation/compensation;Therapeutic Activities;Psychosocial support;Skin care/wound management;Neuromuscular re-education;Disease management/prevention;Discharge planning;Functional mobility training;Stair training PT Transfers Anticipated Outcome(s): modA bed mobility, supervision sitting balance, modA transfers PT Locomotion Anticipated Outcome(s): modA w/c mobility, gait TBD PT Recommendation Follow Up Recommendations: Skilled nursing facility (may progress to home with 24/7 and HHPT ?) Patient destination: [place] (SNF) Equipment Recommended: To be determined Equipment Details: Pt has: RW and cane. Anticipate DME needs   PT  Evaluation Precautions/Restrictions Precautions Precautions: Fall Restrictions Weight Bearing Restrictions: No Other Position/Activity Restrictions: NPO with NGT. B soft rest restraints. General Chart Reviewed: Yes Family/Caregiver Present: Yes Vital Signs Pain Pain Assessment Pain Scale: Faces Faces Pain Scale: No hurt Home Living/Prior Functioning Home Living Available Help at Discharge: Family;Available 24 hours/day Type of Home: House Home Access: Stairs to enter [employer] of Steps: 1 STE Entrance Stairs-Rails: None Home Layout: One level  Lives With: Family Prior Function Level of Independence: Independent with transfers;Independent with gait;Independent with basic ADLs;Independent with homemaking with ambulation;Other (comment) (Per daughter, pt ambulates household distances indep with no AD, ambulates short community distances mod I with RW vs no AD, indep with ADLs. Does not drive, family assists with transportation and medications) Driving: No Vocation: Retired Radiographer, therapeutic - Assessment Additional Comments: Will continue to monitor and assess when pt's alertness improves  Cognition Overall Cognitive Status: Impaired/Different from baseline Arousal/Alertness: Lethargic Orientation Level: Oriented to place;Oriented to person;Disoriented to time;Disoriented to situation Attention: Sustained Sustained Attention: Impaired Sustained Attention Impairment: Verbal basic;Functional basic Memory: Impaired Memory Impairment: Storage deficit Awareness: Impaired Awareness Impairment: Intellectual impairment Problem Solving: Impaired Problem Solving Impairment: Verbal basic;Functional basic Safety/Judgment: Impaired Sensation Sensation Light Touch: Not tested (Pt unable to particiapte in assessment 2/2 decreased alertness. Presents with absent LLE pain withdrawal, delayed LUE pain withdrawal, and + pain withdrawal in RHB). Motor  Motor Motor:  Hemiplegia;Abnormal postural alignment and control Motor - Skilled Clinical Observations: pain withdrawal present in R HB   Trunk/Postural Assessment  Cervical Assessment Cervical Assessment: Exceptions to [facility] (pt with significant forward head posture and cervical flexion while seated EOB, unable to perform active cervical extension to reach midline) Thoracic Assessment Thoracic Assessment: Exceptions to WFL (Rounded shoulers, kyphotic spine) Lumbar Assessment Lumbar Assessment: Exceptions to [facility] (sacral sitting EOB, decreased pelvic mobility) Postural Control Postural Control: Deficits on evaluation  Balance Balance Balance Assessed: Yes Dynamic Sitting Balance Sitting balance - Comments: Pt requires maxA +1 for sitting EOB without BUE support. Demo's posterior and R lateral lean, required max multi-modal cueing for midline orientation.  Delayed to absent self righting reactions. Extremity Assessment  RUE Assessment RUE Assessment: Exceptions to [facility] General Strength Comments: Pt mostly nonparticipatory when prompted with cues. Able to perform weak grip. LUE Assessment LUE Assessment: Exceptions to [facility] General Strength Comments: Pt mostly nonparticipatory when prompted with cues. Able to perform weak grip. Observed functional reaching towards NGT RLE Assessment RLE Assessment: Exceptions to [facility] General Strength Comments: No active movements observed however with attempted sit<>stands facilitation of quads/gluts were present LLE Assessment LLE Assessment: Exceptions to [facility] General Strength Comments: No active movements observed however with attempted sit<>stands facilitation of quads/gluts were present  Care Tool Care Tool Bed Mobility Roll left and right activity   Roll left and right assist level: 2 Helpers    Sit to lying activity   Sit to lying assist level: 2 Helpers    Lying to sitting edge of bed activity   Lying to sitting edge of bed assist level: 2 Helpers     Care  Tool Transfers Sit to stand transfer   Sit to stand assist level: 2 Helpers    Chair/bed transfer Chair/bed transfer activity did not occur: Safety/medical concerns (lethargy, significant generalized weakness)       Toilet transfer Toilet transfer activity did not occur: Safety/medical concerns      Scientist, product/process development transfer activity did not occur: Safety/medical concerns (lethargy, significant generalized weakness)        Care Tool Locomotion Ambulation Ambulation activity did not occur: Safety/medical concerns        Walk 10 feet activity Walk 10 feet activity did not occur: Safety/medical concerns       Walk 50 feet with 2 turns activity Walk 50 feet with 2 turns activity did not occur: Safety/medical concerns      Walk 150 feet activity Walk 150 feet activity did not occur: Safety/medical concerns      Walk 10 feet on uneven surfaces activity Walk 10 feet on uneven surfaces activity did not occur: Safety/medical concerns      Stairs Stair activity did not occur: Safety/medical concerns        Walk up/down 1 step activity Walk up/down 1 step or curb (drop down) activity did not occur: Safety/medical concerns     Walk up/down 4 steps activity did not occuR: Safety/medical concerns  Walk up/down 4 steps activity      Walk up/down 12 steps activity Walk up/down 12 steps activity did not occur: Safety/medical concerns      Pick up small objects from floor Pick up small object from the floor (from standing position) activity did not occur: Safety/medical concerns      Wheelchair Will patient use wheelchair at discharge?: Yes Type of Wheelchair: Manual Wheelchair activity did not occur: Safety/medical concerns      Wheel 50 feet with 2 turns activity Wheelchair 50 feet with 2 turns activity did not occur: Safety/medical concerns    Wheel 150 feet activity Wheelchair 150 feet activity did not occur: Safety/medical concerns      Refer to Care Plan for Long Term  Goals  SHORT TERM GOAL WEEK 1 PT Short Term Goal 1 (Week 1): Pt will complete bed mobility with maxA +1 in order to improve functional independence. PT Short Term Goal 2 (Week 1): Pt will tolerate sitting in TIS w/c for >2 hrs in order to strengthen activity tolerance PT Short Term Goal 3 (Week 1): Pt will safely perform Stedy transfer with maxA +1 from EOB <> w/c. PT Short Term Goal  4 (Week 1): Pt will maintain sitting EOB for >5 minutes with modA +1 without BUE support.  Recommendations for other services: None   Skilled Therapeutic Intervention Mobility Bed Mobility Bed Mobility: Rolling Right;Right Sidelying to Sit;Sit to Sidelying Right;Scooting to [facility];Sitting - Scoot to Edge of Bed Rolling Right: 2 Helpers;Other (comment) (VC/TC for task initiation and sequencing. Required totalA for LLE management and log rolling technique. +2 assist required for safety) Right Sidelying to Sit: 2 Helpers;Other (comment) (Cues provided for initiation and sequencing, unable to perform and required +2 assist for trunk to upright and BLE mananagement. Provided facilitation at the hips to assist with transitoin) Sitting - Scoot to [place] of Bed: 2 Helpers;Other (comment) (Pt unable to initiate movement, required +2 assist for hip translation with bed pad to assist to feet flat) Sit to Sidelying Right: 2 Helpers;Other (comment) (Required +2 assist for trunk and BLE management via reverse log roll, cues for sequencing) Scooting to HOB: 2 Helpers;Other (comment) (HOB flat, transfer pad to assist) Transfers Transfers: Sit to [employer] via Lift Equipment;Stand to Sit Sit to Stand: 2 Helpers;Other/comment (B knee block from raised EOB height, facilitation of hips foward with cues provided for upright posture, unable to achieve full upright but able to clear buttocks from bed level) Stand to Sit: 2 Helpers Transfer via Lift Equipment: Stedy (x1 sit<>stand from raised EOB height to stedy, required hand-over-hand  assist for BUE placement and facilitation at the hips for forward weight shift and quad activiation) Locomotion  Gait Ambulation: No Gait Gait: No Stairs / Additional Locomotion Stairs: No Wheelchair Mobility Wheelchair Mobility: No  Pt required totalA +2 for all bed mobility despite max multi-modal cueing, requiring maxA +1 for static seated EOB with delayed righting responses noted. Provided VC/TC for midline orientation with efforts of improving cervical extension to limited forward head posture. Eyes closed for majority of session and pt mostly nonverbal other than a brief "what" and "I'm here." Performed x1 trial of sit<>stand transfers from raised EOB height with B knee block and no AD, required maxA +2. 2nd trial of transfer performed with Stedy, required totalA for BLE positioning and hand-over-hand assist for BUE's to initiate grasp towards Stedy. Pt was able to sustain a weak grasp on stedy. Required maxA+2 for stedy transfer, unsafe to attempt bed<>chair with stedy due to pt lethargy, poor sitting/standing balance, and significant weakness. Thus, pt was returned supine in bed, B wrist restraints donned, daughter at bedside, bed lowered, and bed alarm on.  Instructed pt and daughter in results of PT evaluation as detailed above, PT POC, rehab potential, rehab goals, and discharge recommendations. Additionally discussed [facility]'s policies regarding fall safety and use of chair alarm and/or quick release belt. Pt verbalized understanding and in agreement. Will update pt's family members as they become available.   Discharge Criteria: Patient will be discharged from PT if patient refuses treatment 3 consecutive times without medical reason, if treatment goals not met, if there is a change in medical status, if patient makes no progress towards goals or if patient is discharged from hospital.  The above assessment, treatment plan, treatment alternatives and goals were discussed and mutually agreed  upon: by family  Alger Simons, PT 07/27/2020, 12:46 PM

## 2020-07-27 NOTE — Progress Notes (Signed)
Alert and oriented to self. Lethargic all shift. Family at bedside all shift. Soft wrist restraints in place, tolerated well.  NG tube became dislodged during shift, attempted reinsertion but was not tolerated well. PA updated and Cortrak team contact for Cortrak insertion. Cortrak inserted and tolerated well. Enteral feed restarted and tolerated well. No s/s pain this shift.

## 2020-07-27 NOTE — Progress Notes (Signed)
[place] PHYSICAL MEDICINE & REHABILITATION PROGRESS NOTE   Subjective/Complaints: Lori Bridges has no complaints this morning. Her daughter is at bedside and is very happy she is here. She says she was pocketing and spitting out her fluids this morning and that she does do this at home sometimes.   ROS: Unable to obtain due to patient's mental status.   Objective:   DG Abd Portable 1V  Result Date: 07/27/2020 CLINICAL DATA:  Check gastric catheter placement EXAM: PORTABLE ABDOMEN - 1 VIEW COMPARISON:  None. FINDINGS: Gastric catheter is noted with the tip in the distal stomach. Scattered large and small bowel gas is noted. Degenerative changes of the lumbar spine are seen. IMPRESSION: Gastric catheter within the distal aspect of the stomach. Electronically Signed   By: Inez Catalina M.D.   On: 07/27/2020 03:41   DG Abd Portable 1V  Result Date: 07/27/2020 CLINICAL DATA:  NG tube placement EXAM: PORTABLE ABDOMEN - 1 VIEW COMPARISON:  None. FINDINGS: Tip the NG tube is seen within the distal stomach. Air seen in nondilated loops of bowel. IMPRESSION: Tip the NG tube within the distal stomach. Electronically Signed   By: Prudencio Pair M.D.   On: 07/27/2020 00:08   Recent Labs    07/26/20 0238 07/27/20 0648  WBC 4.5 3.9*  HGB 13.5 13.6  HCT 42.9 43.9  PLT 127* 133*   Recent Labs    07/26/20 0238 07/27/20 0648  NA 147* 146*  K 3.7 4.1  CL 117* 115*  CO2 20* 23  GLUCOSE 136* 136*  BUN 28* 31*  CREATININE 1.32* 1.21*  CALCIUM 10.1 10.1    Intake/Output Summary (Last 24 hours) at 07/27/2020 1234 Last data filed at 07/27/2020 0908 Gross per 24 hour  Intake 645 ml  Output 375 ml  Net 270 ml     Physical Exam: Vital Signs Blood pressure (!) 133/90, pulse 97, temperature 97.6 F (36.4 C), temperature source Oral, resp. rate 16, height 5\' 6"  (1.676 m), weight 79.8 kg, SpO2 99 %. General: Lethargic, No apparent distress HEENT: Head is normocephalic, atraumatic, PERRLA, EOMI,  sclera anicteric, oral mucosa pink and moist, Nasogastric tube in place Neck: Supple without JVD or lymphadenopathy Heart: Reg rate and rhythm. No murmurs rubs or gallops Chest: CTA bilaterally without wheezes, rales, or rhonchi; no distress Abdomen: Soft, non-tender, non-distended, bowel sounds positive. Extremities: No clubbing, cyanosis, or edema. Pulses are 2+ Skin: Clean and intact without signs of breakdown Neuro: Patient is lethargic but arousable.  She does open her eyes but essentially nonverbal with some grunting sounds.  She would fall back asleep during exam.  Daughter at bedside.  Psych: Pt's affect is lethargic  Assessment/Plan: 1. 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  Physiatrist is providing close team supervision and 24 hour management of active medical problems listed below.  Physiatrist and rehab team continue to assess barriers to discharge/monitor patient progress toward functional and medical goals  Care Tool:  Bathing              Bathing assist       Upper Body Dressing/Undressing Upper body dressing   What is the patient wearing?: Hospital gown only    Upper body assist Assist Level: Dependent - Patient 0%    Lower  Body Dressing/Undressing Lower body dressing      What is the patient wearing?: Incontinence brief     Lower body assist Assist for lower body dressing: Dependent - Patient 0%     Toileting Toileting    Toileting assist Assist for toileting: Dependent - Patient 0%     Transfers Chair/bed transfer  Transfers assist  Chair/bed transfer activity did not occur: Safety/medical concerns (lethargy, significant generalized weakness)         Locomotion Ambulation   Ambulation assist   Ambulation activity did not occur: Safety/medical concerns          Walk 10 feet activity   Assist  Walk 10 feet activity did not occur: Safety/medical concerns        Walk 50 feet activity   Assist Walk 50 feet with 2 turns activity did not occur: Safety/medical concerns         Walk 150 feet activity   Assist Walk 150 feet activity did not occur: Safety/medical concerns         Walk 10 feet on uneven surface  activity   Assist Walk 10 feet on uneven surfaces activity did not occur: Safety/medical concerns         Wheelchair     Assist Will patient use wheelchair at discharge?: Yes Type of Wheelchair: Manual Wheelchair activity did not occur: Safety/medical concerns         Wheelchair 50 feet with 2 turns activity    Assist    Wheelchair 50 feet with 2 turns activity did not occur: Safety/medical concerns       Wheelchair 150 feet activity     Assist  Wheelchair 150 feet activity did not occur: Safety/medical concerns       Blood pressure (!) 133/90, pulse 97, temperature 97.6 F (36.4 C), temperature source Oral, resp. rate 16, height 5\' 6"  (1.676 m), weight 79.8 kg, SpO2 99 %.  Medical Problem List and Plan: 1.  Decreased functional mobility with altered mental status secondary to small left frontal medial ICH and left temporal occipital ICH in the setting of chronic Coumadin/likely hypertensive             -patient may shower             -ELOS/Goals: 27-30 days Min A in PT, OT, SLP  Initial IR evaluations today 2.  Antithrombotics: -DVT/anticoagulation: Subcutaneous heparin for DVT prophylaxis initiated July 25, 2020             -antiplatelet therapy: N/A 3. Pain Management: Tylenol as needed 4. Mood: D/c Ativan due to lethargy.              -antipsychotic agents: N/A 5. Neuropsych: This patient is not capable of making decisions on her own behalf. 6. Skin/Wound Care:  Routine skin checks 7. Fluids/Electrolytes/Nutrition: Routine in and outs with follow-up chemistries 8.  Seizure prophylaxis.  Keppra 1000 mg twice daily.  EEG negative 9.  Atrial fibrillation.  Chronic Coumadin reversed.  Cardiac rate controlled. Continue Lopressor 50 mg twice daily 10.  Dysphagia.  Currently NPO with nasogastric tube feeds.  Dietary follow-up 11.  Hypertension.  Lasix 20 mg daily, hydralazine 25 mg twice daily, Avapro 150 mg daily, Toprol XL 100 mg daily currently on hold.  Resume as needed            1/24: Diastolic elevated, continue to monitor.  12.  Hypothyroidism.  Synthroid 13.  Hyperlipidemia.  Statin on hold in the setting of ICH 14.  UTI/E. coli.  Completing course of Rocephin 15.  GERD.  Protonix 16. Disposition: She lives at home with her grandson. She has seven children nearby who can [rovide support upon discharge.   LOS: 1 days A FACE TO FACE EVALUATION WAS PERFORMED  Lori Bridges 07/27/2020, 12:34 PM

## 2020-07-27 NOTE — Evaluation (Signed)
Speech Language Pathology Assessment and Plan  Patient Details  Name: Lori Bridges MRN: [id number] Date of Birth: 1930/08/28  SLP Diagnosis: Cognitive Impairments;Dysphagia  Rehab Potential: Good ELOS: 21- 28 days    Today's Date: 07/27/2020 SLP Individual Time: 0805-0900 SLP Individual Time Calculation (min): 27 min   Hospital Problem: Principal Problem:   ICH (intracerebral hemorrhage) ([facility]) - 2 small L brain ICH, likely d/t HTN on subtherapeutic warfarin  Past Medical History:  Past Medical History:  Diagnosis Date  . Adenomatous colon polyp 2005  . Anxiety age 70  . Carotid artery bruit 2001   left  . CKD (chronic kidney disease), stage III   . DDD (degenerative disc disease)   . DJD (degenerative joint disease)   . Gastroenteritis 2011  . GERD (gastroesophageal reflux disease) 2003  . History of pneumonia   . HTN (hypertension) 1983  . Hyperlipidemia 2000  . Hypothyroidism 2009  . Iron deficiency anemia 1983  . Osteoarthritis 2001  . Osteoporosis 2001  . Permanent atrial fibrillation ([place]) 07/23/2010       . Persistent headaches 1987  . Pulmonary HTN ([place]) 04/03/2017   Moderate with PASP 2mHg by echo 02/2017  . Tricuspid regurgitation    severe by echo 02/25/12  . Vertigo   . Vitamin D deficiency 2011   Past Surgical History:  Past Surgical History:  Procedure Laterality Date  . COLONOSCOPY W/ BIOPSIES AND POLYPECTOMY  09/16/2004   adenomatous polyp, diverticulosis, internal hemorrhoids  . s/p BLT    . TUBAL LIGATION      Assessment / Plan / Recommendation Clinical Impression   MLorie Bridges Lori Bridges history of atrial fibrillation maintained on Coumadin, hypertension, hyperlipidemia, chronic kidney disease.  Per chart review lives with grandson.  Independent with assistive device was able to dress and bathe herself although family notes some recent decrease in memory.  She has 7 children in the area with good support.   Presented July 23, 2020 with increasing confusion as well as slurred speech and subtle hand and forearm twitching..  CT/MRI showed acute intraparenchymal hemorrhage in the left vertex 2.2 x 1.7 x 2.4 cm in size with surrounding vasogenic edema.  Additional intraparenchymal hemorrhage in the left temporal lobe 11 x 6 x 10 mm.  No midline shift or hydrocephalus.  CT angiogram of head no evidence of enhancing mass or lesion.  Admission chemistries with potassium 3.2, glucose 126, creatinine 1.22 alcohol negative SARS coronavirus negative, INR 1.5.  She was loaded with Keppra and EEG negative for seizure.  Patient did receive Kcentra/vitamin K to reverse Coumadin.  Cardene drip initially for blood pressure controlled.  She was cleared to begin subcutaneous heparin for DVT prophylaxis July 25, 2020.  She remains n.p.o. with alternative means of nutritional support.  Patient with findings of E. coli urinary tract infection completing course of Rocephin.  Therapy evaluations completed and patient was admitted for a comprehensive rehab program.  Pt presents with oral holding of thin liquid boluses despite max multimodal cues to swallow.  Pt perseveratively swished and spit liquids from the oral cavity and did not swallow any boluses offered.  I would recommend that pt remain NPO with alternative means of nutrition.  Barriers to safe diet progression include lethargy and decreased willingness to allow care or participate in treatment.  Cognitive-linguistic evaluation was limited due to lethargy and minimal engagement in treatment.  Pt kept her eyes closed for the duration of today's session and resisted most  attempts at care.  Pt was able to identify that she was at a hospital via a combination of yes/ no responses and close ended/choice of two questions.  Speech was difficult to understand but it is unclear whether this is related to lethargy versus oral motor weakness.  Given the deficits mentioned above, pt currently  requires total assist to complete basic, familiar self care tasks.    Pt would benefit from skilled ST while inpatient in order to maximize functional independence and decrease burden of care at discharge.  Anticipate that pt will need 24/7 supervision and extensive care at discharge in addition to [place] follow up at next level of care.    Skilled Therapeutic Interventions          Cognitive-linguistic and bedside swallow evaluation completed with results and recommendations reviewed with family.     SLP Assessment  Patient will need skilled [facility] during CIR admission    Recommendations  SLP Diet Recommendations: NPO Medication Administration: Via alternative means Oral Care Recommendations: Oral care QID Patient destination: Home (versus SNF) Follow up Recommendations: Home Health SLP;Skilled Nursing facility;24 hour supervision/assistance Equipment Recommended: To be determined    SLP Frequency 3 to 5 out of 7 days   SLP Duration  SLP Intensity  SLP Treatment/Interventions 21- 28 days  Minumum of 1-2 x/day, 30 to 90 minutes  Cognitive remediation/compensation;Cueing hierarchy;Dysphagia/aspiration precaution training;Functional tasks;Internal/external aids;Patient/family education    Pain Pain Assessment Pain Scale: Faces Faces Pain Scale: No hurt  Prior Functioning Cognitive/Linguistic Baseline: Baseline deficits Baseline deficit details: sedentary, needed assistance managing medications, dtr cooked meals for her Type of Home: House  Lives With: Family Available Help at Discharge: Family;Available 24 hours/day Vocation: Retired  Programmer, systems Overall Cognitive Status: Impaired/Different from baseline Arousal/Alertness: Lethargic Orientation Level: Oriented to place;Oriented to person;Disoriented to time;Disoriented to situation Attention: Sustained Sustained Attention: Impaired Sustained Attention Impairment: Verbal  basic;Functional basic Memory: Impaired Memory Impairment: Storage deficit Awareness: Impaired Awareness Impairment: Intellectual impairment Problem Solving: Impaired Problem Solving Impairment: Verbal basic;Functional basic Safety/Judgment: Impaired  Comprehension Auditory Comprehension Overall Auditory Comprehension: Other (comment) (difficult to assess, pt was reluctant to accept care, had poor initiation and was minimally engaged in treatment) Expression Expression Primary Mode of Expression: Verbal Verbal Expression Overall Verbal Expression: Other (comment) (difficult to assess, minimally verbal during session but appropriate at times) Oral Motor Oral Motor/Sensory Function Overall Oral Motor/Sensory Function: Mild impairment Facial ROM: Within Functional Limits Facial Symmetry: Within Functional Limits Lingual Symmetry: Abnormal symmetry right;Suspected CN XII (hypoglossal) dysfunction Motor Speech Overall Motor Speech: Impaired (suspect impacted by cognition) Respiration: Within functional limits Phonation: Normal Articulation: Impaired Level of Impairment: Word  Care Tool Care Tool Cognition Expression of Ideas and Wants Expression of Ideas and Wants: Frequent difficulty - frequently exhibits difficulty with expressing needs and ideas   Understanding Verbal and Non-Verbal Content Understanding Verbal and Non-Verbal Content: Sometimes understands - understands only basic conversations or simple, direct phrases. Frequently requires cues to understand   Memory/Recall Ability *first 3 days only Memory/Recall Ability *first 3 days only: That he or she is in a hospital/hospital unit     Bedside Swallowing Assessment General Previous Swallow Assessment: BSE 07/24/20 Diet Prior to this Study: NPO;NG Tube Temperature Spikes Noted: No Respiratory Status: Supplemental O2 delivered via (comment) (nasal cannula) History of Recent Intubation: No Behavior/Cognition:  Uncooperative;Requires cueing Oral Cavity - Dentition: Dentures, top;Dentures, bottom Self-Feeding Abilities: Total assist Patient Positioning: Upright in bed Volitional Cough: Cognitively unable to elicit Volitional  Swallow: Unable to elicit  Oral Care Assessment   Ice Chips   Thin Liquid Thin Liquid: Impaired Presentation: Straw Oral Phase Impairments: Poor awareness of bolus Oral Phase Functional Implications: Oral holding Pharyngeal  Phase Impairments: Unable to trigger swallow Nectar Thick   Honey Thick   Puree   Solid   BSE Assessment Risk for Aspiration Impact on safety and function: Moderate aspiration risk Other Related Risk Factors: History of GERD;History of pneumonia;Cognitive impairment  Short Term Goals: Week 1: SLP Short Term Goal 1 (Week 1): Pt will remain awake (eyes opened) for 5-7 minute intervals with max assist multimodal cues. SLP Short Term Goal 2 (Week 1): Pt will sustain her attention to basic, familiar tasks for 1 minute intervals with max assist multimodal cues. SLP Short Term Goal 3 (Week 1): Pt will complete basic, familiar tasks with max assist for functional problem solving. SLP Short Term Goal 4 (Week 1): Pt will consume therapeutic trials of thin liquids and purees with max assist multimodal cues for swallow initiation and without respiratory decompensation over 3 consecutive sessions.  Refer to Care Plan for Long Term Goals  Recommendations for other services: None   Discharge Criteria: Patient will be discharged from SLP if patient refuses treatment 3 consecutive times without medical reason, if treatment goals not met, if there is a change in medical status, if patient makes no progress towards goals or if patient is discharged from hospital.  The above assessment, treatment plan, treatment alternatives and goals were discussed and mutually agreed upon: by family  Emilio Math 07/27/2020, 12:49 PM

## 2020-07-27 NOTE — Procedures (Signed)
Cortrak  Person Inserting Tube:  Colbey Wirtanen M, RD Tube Type:  Cortrak - 43 inches Tube Location:  Left nare Initial Placement:  Stomach Secured by: Bridle Technique Used to Measure Tube Placement:  Documented cm marking at nare/ corner of mouth Cortrak Secured At:  65 cm Procedure Comments:  Cortrak Tube Team Note:  Consult received to place a Cortrak feeding tube.   No x-ray is required. RN may begin using tube.   If the tube becomes dislodged please keep the tube and contact the Cortrak team at [email or web address] (password [email or web address]) for replacement.  If after hours and replacement cannot be delayed, place a NG tube and confirm placement with an abdominal x-ray.      Helmi Hechavarria, MS, RD, LDN, CNSC Inpatient Clinical Dietitian RD pager # available in AMION  After hours/weekend pager # available in AMION      

## 2020-07-27 NOTE — Progress Notes (Signed)
Verbal order given from Danella Sensing, NP for bilateral soft wrist restraints.

## 2020-07-27 NOTE — Progress Notes (Signed)
[facility] Individual Statement of Services  Patient Name:  Lori Bridges  Date:  07/27/2020  Welcome to the [facility].  Our goal is to provide you with an individualized program based on your diagnosis and situation, designed to meet your specific needs.  With this comprehensive rehabilitation program, you will be expected to participate in at least 3 hours of rehabilitation therapies Monday-Friday, with modified therapy programming on the weekends.  Your rehabilitation program will include the following services:  Physical Therapy (PT), Occupational Therapy (OT), Speech Therapy (ST), 24 hour per day rehabilitation nursing, Therapeutic Recreation (TR), Neuropsychology, Care Coordinator, Rehabilitation Medicine, Nutrition Services and Pharmacy Services  Weekly team conferences will be held on Wednesdays to discuss your progress.  Your Inpatient Rehabilitation Care Coordinator will talk with you frequently to get your input and to update you on team discussions.  Team conferences with you and your family in attendance may also be held.  Expected length of stay: 21-28 days Overall anticipated outcome: Moderate Assistance level  Depending on your progress and recovery, your program may change. Your Inpatient Rehabilitation Care Coordinator will coordinate services and will keep you informed of any changes. Your Inpatient Rehabilitation Care Coordinator's name and contact numbers are listed  below.  The following services may also be recommended but are not provided by the [facility]:   [place] will be made to provide these services after discharge if needed.  Arrangements include referral to agencies that provide these services.  Your insurance has been verified to be:  Medicare A+B Your primary doctor is:  Derinda Late, MD  Pertinent information  will be shared with your doctor and your insurance company.  Inpatient Rehabilitation Care Coordinator:  Erlene Quan, [place] or [phone]  Information discussed with and copy given to patient by: Margarito Liner, 07/27/2020, 4:43 PM

## 2020-07-27 NOTE — Progress Notes (Signed)
[name] notified that b/l soft wrist restraints were applied due to safety concerns(pt continues to try to remove NGT). Dan Angiulli-PA.C updated r/o to patient status.

## 2020-07-27 NOTE — Progress Notes (Signed)
Initial Nutrition Assessment  RD working remotely.  DOCUMENTATION CODES:   Not applicable  INTERVENTION:   Initiate Osmolite 1.2 @ 65 ml/hr via NGT over 20 hour period  45 ml Prosource TF daily.    150 ml free water flush every 4 hours  Tube feeding regimen provides 1600 kcal (100% of needs), 87 grams of protein, and 1066 ml of H2O. Total free water: 1955 ml daily  NUTRITION DIAGNOSIS:   Inadequate oral intake related to inability to eat as evidenced by NPO status.  GOAL:   Patient will meet greater than or equal to 90% of their needs  MONITOR:   Diet advancement, Weight trends, Labs, TF tolerance, Skin, I & O's  REASON FOR ASSESSMENT:   Consult Enteral/tube feeding initiation and management  ASSESSMENT:   Lori Bridges is an 86 y.o. female with history of atrial fibrillation on coumadin, hypertension, hyperlipidemia, hypothyroidism and chronic kidney disease. Admitted with [facility].  Pt admitted to CIR with decreased functional mobility with altered mental status secondary to small left frontal medial ICH and left temporal occipital ICH in the setting of chronic Coumadin/likely hypertensive.   7/29- cortrak tube removed 7/30- NGT placed at bedside- placement confirmed by KUB  Reviewed I/O's: +270 ml x 24 hours   UOP: 375 ml x 24 hours  Attempted to speak with pt via hospital room phone, however, no answer.   Per previous RD notes, pt had difficulty swallowing prior to hospital admission (coughing on soft textured foods). Pt remains NPO and will need MBSS when ready prior to starting a diet. She has been receiving TF via NGT for nutritional support.   Reviewed wt hx; no wt loss over the past year.   Labs reviewed: Na: 146, CBGS: 110-120 (inpatient orders for glycemic control are none).   Diet Order:   Diet Order            Diet NPO time specified  Diet effective now                 EDUCATION NEEDS:   No education needs have been identified at this  time  Skin:  Skin Assessment: Skin Integrity Issues: Skin Integrity Issues:: Other (Comment) Other: skin tear to lt thigh, open wound to perineum  Last BM:  07/27/20  Height:   Ht Readings from Last 1 Encounters:  07/26/20 5\' 6"  (1.676 m)    Weight:   Wt Readings from Last 1 Encounters:  07/27/20 79.8 kg    Ideal Body Weight:  59.1 kg  BMI:  Body mass index is 28.4 kg/m.  Estimated Nutritional Needs:   Kcal:  1550-1750  Protein:  80-95 grams  Fluid:  > 1.5 L    Loistine Chance, RD, LDN, [place] Registered Dietitian II Certified Diabetes Care and Education Specialist Please refer to [facility] for RD and/or RD on-call/weekend/after hours pager

## 2020-07-27 NOTE — Progress Notes (Signed)
Inpatient Rehabilitation  Patient information reviewed and entered into eRehab system by Rosanne Wohlfarth M. Riti Rollyson, M.A., CCC/SLP, PPS Coordinator.  Information including medical coding, functional ability and quality indicators will be reviewed and updated through discharge.    

## 2020-07-27 NOTE — Progress Notes (Signed)
Upon entering room patients Cortrak feeding tube was out of patient's nose in patient's left hand. Pt not showing any signs of discomfort or distress. VS are stable. Danella Sensing, NP made aware. Orders received to place nasogastric tube and abdominal xray for NGT placement verification. Patient educated about NGT and placement. NGT placed without difficulty. NGT per abd xray is in place. Tube feed restarted. NP made aware.

## 2020-07-27 NOTE — Progress Notes (Signed)
Patient information reviewed and entered into eRehab System by Becky Javanni Maring, PPS coordinator. Information including medical coding, function ability, and quality indicators will be reviewed and updated through discharge.   

## 2020-07-27 NOTE — Progress Notes (Signed)
Patient Details  Name: Lori Bridges MRN: [id number] Date of Birth: 1930-03-18  Today's Date: 07/27/2020  Hospital Problems: Principal Problem:   ICH (intracerebral hemorrhage) ([place]) - 2 small L brain ICH, likely d/t HTN on subtherapeutic warfarin  Past Medical History:  Past Medical History:  Diagnosis Date  . Adenomatous colon polyp 2005  . Anxiety age 86  . Carotid artery bruit 2001   left  . CKD (chronic kidney disease), stage III   . DDD (degenerative disc disease)   . DJD (degenerative joint disease)   . Gastroenteritis 2011  . GERD (gastroesophageal reflux disease) 2003  . History of pneumonia   . HTN (hypertension) 1983  . Hyperlipidemia 2000  . Hypothyroidism 2009  . Iron deficiency anemia 1983  . Osteoarthritis 2001  . Osteoporosis 2001  . Permanent atrial fibrillation ([place]) 07/23/2010       . Persistent headaches 1987  . Pulmonary HTN ([place]) 04/03/2017   Moderate with PASP 56mmHg by echo 02/2017  . Tricuspid regurgitation    severe by echo 02/25/12  . Vertigo   . Vitamin D deficiency 2011   Past Surgical History:  Past Surgical History:  Procedure Laterality Date  . COLONOSCOPY W/ BIOPSIES AND POLYPECTOMY  09/16/2004   adenomatous polyp, diverticulosis, internal hemorrhoids  . s/p BLT    . TUBAL LIGATION     Social History:  reports that she has never smoked. She has never used smokeless tobacco. She reports that she does not drink alcohol and does not use drugs.  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   Abuse/Neglect Abuse/Neglect Assessment Can Be  Completed: Unable to assess, patient is non-responsive or altered mental status  Emotional Status Pt's affect, behavior and adjustment status: Lethargic Recent Psychosocial Issues: Anxiety, restlessness  Patient / Family Perceptions, Expectations & Goals Pt/Family understanding of illness & functional limitations: Daughter has a fair understanding of current health status and functional limitations Premorbid pt/family roles/activities: Independent with ADLs, using AD (RW) PTA Anticipated changes in roles/activities/participation: Anticipate patient will need assistance with ADLs at discharge Pt/family expectations/goals: Would like for the patient to progress to a level she can be managed with one/two people assistance  [employer]: None Premorbid Home Care/DME Agencies: None Transportation available at discharge: Family members will provide transportation at discharge Resource referrals recommended: Neuropsychology  Discharge Planning Living Arrangements: Children, Other relatives Support Systems: Children Type of Residence: Private residence Insurance Resources: [employer] Screen Referred: No Living Expenses: Lives with family Money Management: Family (Daughter) Home Management: Family will manage the home Patient/Family Preliminary Plans: Discharge home with daughter and other family coming in to assist with care needs Care Coordinator Barriers to Discharge: Home environment access/layout Care Coordinator Barriers to Discharge Comments: 4 step entry to the home Care Coordinator Anticipated Follow Up Needs: [place] Additional Notes/Comments: Home with daughter and other family to assist as needed Expected length of stay: 21-28 days  Clinical Impression Patient is lethargic, restless. Daughter at bedside reports information and supportive family group for discharge. Reviewed role of CM and collaboration with SW Margreta Journey) to facilitate  discharge plans. Reviewed team conference and process for assuring follow up services [facility] and DME). Patient is currently NPO  awaiting placement of Cortrak for TF.  Margarito Liner 07/27/2020, 4:51 PM

## 2020-07-28 ENCOUNTER — Inpatient Hospital Stay (HOSPITAL_COMMUNITY): Payer: Medicare Other

## 2020-07-28 ENCOUNTER — Inpatient Hospital Stay (HOSPITAL_COMMUNITY): Payer: Medicare Other | Admitting: Speech Pathology

## 2020-07-28 ENCOUNTER — Inpatient Hospital Stay (HOSPITAL_COMMUNITY): Payer: Medicare Other | Admitting: Physical Therapy

## 2020-07-28 DIAGNOSIS — I611 Nontraumatic intracerebral hemorrhage in hemisphere, cortical: Secondary | ICD-10-CM | POA: Diagnosis not present

## 2020-07-28 NOTE — Progress Notes (Signed)
Physical Therapy Session Note  Patient Details  Name: Lori Bridges MRN: [id number] Date of Birth: 25-Mar-1930  Today's Date: 07/28/2020 PT Individual Time: 1355-1452 PT Individual Time Calculation (min): 57 min   Short Term Goals: Week 1:  PT Short Term Goal 1 (Week 1): Pt will complete bed mobility with maxA +1 in order to improve functional independence. PT Short Term Goal 2 (Week 1): Pt will tolerate sitting in TIS w/c for >2 hrs in order to strengthen activity tolerance PT Short Term Goal 3 (Week 1): Pt will safely perform [place] transfer with maxA +1 from EOB <> w/c. PT Short Term Goal 4 (Week 1): Pt will maintain sitting EOB for >5 minutes with modA +1 without BUE support.  Skilled Therapeutic Interventions/Progress Updates:  Pt received in bed with daughter [name]) present for session. Pt briefly opened eyes (<5 seconds) intermittent during session with max stimulation. Pt requires +2 assist for supine<>sit & rolling L<>R in bed. Pt assisted to sitting EOB in attempts to increase alertness but not effective so pt returned to bed. PT & tech positioned maxi move sling & pt assisted to TIS w/c via maxi move +2 assist. [name] assisted with ensuring pt did not pull on NG tube throughout session. Pt positioned in w/c & PT attempted to adjust headrest & leg rests but unable to (have left note for need for new w/c). Assisted pt with repositioning in w/c seat & positioning arms & head with use of pillows to correct R lateral lean. Pt does not arouse during activity. Encouraged [name] & sister [name]) to engage with pt in hopes of increasing alertness but they report pt will sleep in a chair with lights & TV on at home so those things may not help much. Educated [place] on how to tilt pt in w/c for repositioning & pressure relief & she voices understanding. Asked nursing staff to allow pt to sit up for ~30 minutes before returning to bed. Pt left in TIS w/c with seat belt alarm donned, BUE wrist  restraints & daughters in room.   Therapy Documentation Precautions:  Precautions Precautions: Fall Restrictions Weight Bearing Restrictions: No Other Position/Activity Restrictions: NPO with NGT. B soft rest restraints.  Pain: No behaviors demonstrating pain.    Therapy/Group: Individual Therapy  Waunita Schooner 07/28/2020, 3:00 PM

## 2020-07-28 NOTE — Progress Notes (Signed)
Occupational Therapy Session Note  Patient Details  Name: Lori Bridges MRN: [id number] Date of Birth: 01-17-1930  Today's Date: 07/28/2020 OT Individual Time: 1000-1100 OT Individual Time Calculation (min): 60 min    Short Term Goals: Week 1:  OT Short Term Goal 1 (Week 1): Pt will demonstrate alertness to engage in UB bathing with max A. OT Short Term Goal 2 (Week 1): Pt will follow 1 step commands with 50 % accuracy with donning UB clothing with max A. OT Short Term Goal 3 (Week 1): Pt will sit to EOB with max a. OT Short Term Goal 4 (Week 1): Pt will maintain static sitting for 5 min at EOB with mod A to prepare her for sitting on a BSC.  Skilled Therapeutic Interventions/Progress Updates:    1:1. Pt and daughter Lori Bridges present. Pt incontinent of bladder (small amount) requiring total A for rolling to don new brief and cleanse peri/buttocks area. Pt requries HOH A to wash face and multiple bouts of intervention to keep pt from pulling at NG tube. Pt completes often resistive to facilitation techniques, and states, "I dont want to" when OT and daughter cue pt to open her eyes. Pt sits EOB with initially MAX A +2, however after multiple bouts of weigth shiftinging/"swaying" with OT around BOS at EOB/WB into L elbow into bed, pt able to sit up at midline with S for about 10 sec prior to R lean. Daughter pleased with tx and pt retuned to supine, wrist restraints applied and exit alarm on  Therapy Documentation Precautions:  Precautions Precautions: Fall Restrictions Weight Bearing Restrictions: No Other Position/Activity Restrictions: NPO with NGT. B soft rest restraints. General:   Vital Signs:  Pain:   ADL:   Vision   Perception    Praxis   Exercises:   Other Treatments:     Therapy/Group: Individual Therapy  Tonny Branch 07/28/2020, 11:00 AM

## 2020-07-28 NOTE — Progress Notes (Addendum)
Speech Language Pathology Daily Session Note  Patient Details  Name: Lori Bridges MRN: [id number] Date of Birth: 1930/02/13  Today's Date: 07/28/2020 SLP Individual Time: 1120-1150 SLP Individual Time Calculation (min): 30 min  Short Term Goals: Week 1: SLP Short Term Goal 1 (Week 1): Pt will remain awake (eyes opened) for 5-7 minute intervals with max assist multimodal cues. SLP Short Term Goal 2 (Week 1): Pt will sustain her attention to basic, familiar tasks for 1 minute intervals with max assist multimodal cues. SLP Short Term Goal 3 (Week 1): Pt will complete basic, familiar tasks with max assist for functional problem solving. SLP Short Term Goal 4 (Week 1): Pt will consume therapeutic trials of thin liquids and purees with max assist multimodal cues for swallow initiation and without respiratory decompensation over 3 consecutive sessions.  Skilled Therapeutic Interventions:  Pt was seen for skilled ST targeting goals for dysphagia and cognition.  Pt was slightly less resistant to attempts at oral care but would not allow therapist to remove her dentures to clean them despite being malodorous due to not having been removed and cleaned recently.   SLP explained the importance of oral care as it pertains to minimizing risk for infection in the setting of NPO status and ongoing need for alternative means of nutrition to pt's daughter. Pt continues to present with perseverative swishing and spitting pattern which prevents her from being able to initiate a swallow during trials of thin liquids via straw.  Pt remained with her eyes closed throughout therapy session but would intermittently express frustration at therapist's attempts to awaken her by saying "what?!"  Pt did respond in a more timely manner when given the choice of whether or not she wanted to cease today's therapy session given her limited arousal and ability to participate and told the therapist "Bye" as she was departing.  Pt was  left in bed with bed alarm set, bilateral wrist restraints on, and daughters at bedside.  Continue per current plan of care.    Pain Pain Assessment Pain Scale: 0-10 Pain Score: 0-No pain  Therapy/Group: Individual Therapy  Yaresly Menzel, Selinda Orion 07/28/2020, 12:39 PM

## 2020-07-28 NOTE — Progress Notes (Signed)
[facility] PHYSICAL MEDICINE & REHABILITATION PROGRESS NOTE   Subjective/Complaints:  Pt reports she's sleepy and went back to sleep- daughter at bedside- pt's daughter concerned about TTP/discomofort of LUE/L hand- is mildly swollen.    ROS: unable to assess due to sedation/cognition   Objective:   DG Abd Portable 1V  Result Date: 07/27/2020 CLINICAL DATA:  Check gastric catheter placement EXAM: PORTABLE ABDOMEN - 1 VIEW COMPARISON:  None. FINDINGS: Gastric catheter is noted with the tip in the distal stomach. Scattered large and small bowel gas is noted. Degenerative changes of the lumbar spine are seen. IMPRESSION: Gastric catheter within the distal aspect of the stomach. Electronically Signed   By: Inez Catalina M.D.   On: 07/27/2020 03:41   DG Abd Portable 1V  Result Date: 07/27/2020 CLINICAL DATA:  NG tube placement EXAM: PORTABLE ABDOMEN - 1 VIEW COMPARISON:  None. FINDINGS: Tip the NG tube is seen within the distal stomach. Air seen in nondilated loops of bowel. IMPRESSION: Tip the NG tube within the distal stomach. Electronically Signed   By: Prudencio Pair M.D.   On: 07/27/2020 00:08   Recent Labs    07/26/20 0238 07/27/20 0648  WBC 4.5 3.9*  HGB 13.5 13.6  HCT 42.9 43.9  PLT 127* 133*   Recent Labs    07/26/20 0238 07/27/20 0648  NA 147* 146*  K 3.7 4.1  CL 117* 115*  CO2 20* 23  GLUCOSE 136* 136*  BUN 28* 31*  CREATININE 1.32* 1.21*  CALCIUM 10.1 10.1    Intake/Output Summary (Last 24 hours) at 07/28/2020 1432 Last data filed at 07/28/2020 0900 Gross per 24 hour  Intake 0 ml  Output 1100 ml  Net -1100 ml     Physical Exam: Vital Signs Blood pressure (!) 136/86, pulse 97, temperature 97.7 F (36.5 C), temperature source Axillary, resp. rate 16, height 5\' 6"  (1.676 m), weight 79.8 kg, SpO2 97 %. General: Lvery lethargic, kept falling asleep with head forward, NAD HEENT: cotrak in place Neck: Supple without JVD or lymphadenopathy Heart: regular  rate Chest: CTA B/L- no W/R/R- good air movement Abdomen: Soft, NT, ND, (+)BS . Extremities: No clubbing, cyanosis, or edema. Pulses are 2+ Skin: Clean and intact without signs of breakdown- L hand 1+ swelling- is propped up Neuro: Patient is lethargic but arousable still.  She does open her eyes but essentially nonverbal with some grunting sounds.  She would fall back asleep during exam.  Daughter at bedside.  Psych: Pt's affect is lethargic- no change Assessment/Plan: 1. 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  Physiatrist is providing close team supervision and 24 hour management of active medical problems listed below.  Physiatrist and rehab team continue to assess barriers to discharge/monitor patient progress toward functional and medical goals  Care Tool:  Bathing  Bathing activity did not occur:  (did not occur on eval, as pt was not able to be aroused awake)           Bathing assist       Upper Body Dressing/Undressing Upper body dressing Upper body dressing/undressing activity did not occur (including orthotics):  (did not occur on eval, as pt was not able to be aroused awake) What is the patient wearing?: Hospital gown only  Upper body assist Assist Level: Total Assistance - Patient < 25%    Lower Body Dressing/Undressing Lower body dressing      What is the patient wearing?: Incontinence brief     Lower body assist Assist for lower body dressing: Total Assistance - Patient < 25%     Toileting Toileting    Toileting assist Assist for toileting: 2 Helpers (incontinent of bowel)     Transfers Chair/bed transfer  Transfers assist  Chair/bed transfer activity did not occur:  Safety/medical concerns        Locomotion Ambulation   Ambulation assist   Ambulation activity did not occur: Safety/medical concerns          Walk 10 feet activity   Assist  Walk 10 feet activity did not occur: Safety/medical concerns        Walk 50 feet activity   Assist Walk 50 feet with 2 turns activity did not occur: Safety/medical concerns         Walk 150 feet activity   Assist Walk 150 feet activity did not occur: Safety/medical concerns         Walk 10 feet on uneven surface  activity   Assist Walk 10 feet on uneven surfaces activity did not occur: Safety/medical concerns         Wheelchair     Assist Will patient use wheelchair at discharge?: Yes Type of Wheelchair: Manual Wheelchair activity did not occur: Safety/medical concerns         Wheelchair 50 feet with 2 turns activity    Assist    Wheelchair 50 feet with 2 turns activity did not occur: Safety/medical concerns       Wheelchair 150 feet activity     Assist  Wheelchair 150 feet activity did not occur: Safety/medical concerns       Blood pressure (!) 136/86, pulse 97, temperature 97.7 F (36.5 C), temperature source Axillary, resp. rate 16, height 5\' 6"  (1.676 m), weight 79.8 kg, SpO2 97 %.  Medical Problem List and Plan: 1.  Decreased functional mobility with altered mental status secondary to small left frontal medial ICH and left temporal occipital ICH in the setting of chronic Coumadin/likely hypertensive             -patient may shower             -ELOS/Goals: 27-30 days Min A in PT, OT, SLP  Initial IR evaluations today  7/31- needs L hand propped for some swelling 2.  Antithrombotics: -DVT/anticoagulation: Subcutaneous heparin for DVT prophylaxis initiated July 25, 2020             -antiplatelet therapy: N/A 3. Pain Management: Tylenol as needed  7/31- pain controlled- con't tylenol prn 4. Mood: D/c Ativan due to lethargy.               -antipsychotic agents: N/A 5. Neuropsych: This patient is not capable of making decisions on her own behalf. 6. Skin/Wound Care: Routine skin checks 7. Fluids/Electrolytes/Nutrition: Routine in and outs with follow-up chemistries 8.  Seizure prophylaxis.  Keppra 1000 mg twice daily.  EEG negative 9.  Atrial fibrillation.  Chronic Coumadin reversed.  Cardiac rate controlled. Continue Lopressor 50 mg twice daily 10.  Dysphagia.  Currently NPO with nasogastric tube feeds.  Dietary follow-up  7/31- con't cortrak- is in correct place per xrays 11.  Hypertension.  Lasix 20 mg daily, hydralazine 25 mg twice daily, Avapro 150 mg daily, Toprol XL 100 mg daily currently on hold.  Resume as needed            9/44: Diastolic elevated, continue to monitor.   7/31- BP 136/86- improved- con't regimen 12.  Hypothyroidism.  Synthroid 13.  Hyperlipidemia.  Statin on hold in the setting of ICH 14.  UTI/E. coli.  Completing course of Rocephin 15.  GERD.  Protonix 16. Disposition: She lives at home with her grandson. She has seven children nearby who can provide support upon discharge.   LOS: 2 days A FACE TO FACE EVALUATION WAS PERFORMED  Yamilee Harmes 07/28/2020, 2:32 PM

## 2020-07-28 NOTE — Plan of Care (Signed)
  Problem: RH BOWEL ELIMINATION Goal: RH STG MANAGE BOWEL WITH ASSISTANCE Description: STG Manage Bowel with Assistance. Outcome: Not Progressing; incontinence   Problem: RH BLADDER ELIMINATION Goal: RH STG MANAGE BLADDER WITH ASSISTANCE Description: STG Manage Bladder With Assistance Outcome: Not Progressing; incontinence   

## 2020-07-28 NOTE — Progress Notes (Signed)
Occupational Therapy Session Note  Patient Details  Name: Lori Bridges MRN: [id number] Date of Birth: 1930-06-08  Today's Date: 07/28/2020 OT Individual Time: 1300-1340 OT Individual Time Calculation (min): 40 min    Short Term Goals: Week 1:  OT Short Term Goal 1 (Week 1): Pt will demonstrate alertness to engage in UB bathing with max A. OT Short Term Goal 2 (Week 1): Pt will follow 1 step commands with 50 % accuracy with donning UB clothing with max A. OT Short Term Goal 3 (Week 1): Pt will sit to EOB with max a. OT Short Term Goal 4 (Week 1): Pt will maintain static sitting for 5 min at EOB with mod A to prepare her for sitting on a BSC.  Skilled Therapeutic Interventions/Progress Updates:    1:1. Pt received in bed asleep with decreased arousal thoruhgout session. Different daighter present. Educated daughter on OT role/purpose and plan to address edema management this session since PT scheduled soon after OT. Edema massage performed for RUE with improvements in skin wrinkles after ~10 min. Educated daughter on how to perform for mother and also how to position RUE on pillows for elevation to manage fluid. Ot pplied K tape to RUE on dorsal side of hand for edema management as well as improved activation of wrist/digit extensors. Pt provided with teds/placed o B feet with edu re bag technique to daughter for application. Exited session with pt seated in bed, exit alram on and B wrist restraints applied  Therapy Documentation Precautions:  Precautions Precautions: Fall Restrictions Weight Bearing Restrictions: No Other Position/Activity Restrictions: NPO with NGT. B soft rest restraints. General:   Vital Signs:  Pain: Pain Assessment Pain Scale: 0-10 Pain Score: 0-No pain ADL:   Vision   Perception    Praxis   Exercises:   Other Treatments:     Therapy/Group: Individual Therapy  Tonny Branch 07/28/2020, 1:41 PM

## 2020-07-28 NOTE — Plan of Care (Signed)
  Problem: RH SAFETY Goal: RH STG ADHERE TO SAFETY PRECAUTIONS W/ASSISTANCE/DEVICE Description: STG Adhere to Safety Precautions With Assistance/Device. Outcome: Progressing

## 2020-07-29 DIAGNOSIS — I611 Nontraumatic intracerebral hemorrhage in hemisphere, cortical: Secondary | ICD-10-CM | POA: Diagnosis not present

## 2020-07-29 MED ORDER — HYDRALAZINE HCL 25 MG PO TABS
25.0000 mg | ORAL_TABLET | Freq: Two times a day (BID) | ORAL | Status: DC
  Administered 2020-07-29 – 2020-08-11 (×24): 25 mg
  Filled 2020-07-29 (×27): qty 1

## 2020-07-29 NOTE — IPOC Note (Signed)
Overall Plan of Care [facility]) Patient Details Name: Lori Bridges MRN: [id number] DOB: 05-02-1930  Admitting Diagnosis: ICH (intracerebral hemorrhage) [facility])  Hospital Problems: Principal Problem:   ICH (intracerebral hemorrhage) (HCC) - 2 small L brain ICH, likely d/t HTN on subtherapeutic warfarin     Functional Problem List: Nursing Behavior, Bladder, Bowel, Edema, Endurance, Medication Management, Nutrition, Pain, Safety, Skin Integrity  PT Balance, Behavior, Endurance, Motor, Safety, Sensory  OT Balance, Cognition, Endurance, Perception, Pain, Motor, Sensory, Vision  SLP Cognition, Nutrition  TR         Basic ADL's: OT Eating, Grooming, Bathing, Dressing, Toileting     Advanced  ADL's: OT       Transfers: PT Bed Mobility, Bed to Chair, Car  OT Toilet     Locomotion: PT Ambulation, Wheelchair Mobility, Stairs     Additional Impairments: OT Fuctional Use of Upper Extremity  SLP Swallowing, Social Cognition   Problem Solving, Memory, Attention, Awareness  TR      Anticipated Outcomes Item Anticipated Outcome  Self Feeding mod A  Swallowing  mod assist   Basic self-care  mod A  Toileting  max A   Bathroom Transfers mod A to toilet  Bowel/Bladder  patient will be continent of bowel and bladder with mod assist  Transfers  modA bed mobility, supervision sitting balance, modA transfers  Locomotion  modA w/c mobility, gait TBD  Communication     Cognition  mod assist  Pain  Pain will be less than or equal to 4/10 with min assist  Safety/Judgment  patient will be free from falls/injury and making appropriate safety decisions with mod assist   Therapy Plan: PT Intensity: Minimum of 1-2 x/day ,45 to 90 minutes PT Frequency: 5 out of 7 days PT Duration Estimated Length of Stay: 3 weeks OT Intensity: Minimum of 1-2 x/day, 45 to 90 minutes OT Frequency: 5 out of 7 days OT Duration/Estimated Length of Stay: 28-30 days SLP Intensity: Minumum of 1-2 x/day, 30  to 90 minutes SLP Frequency: 3 to 5 out of 7 days SLP Duration/Estimated Length of Stay: 21- 28 days   Due to the current state of emergency, patients may not be receiving their 3-hours of Medicare-mandated therapy.   Team Interventions: Nursing Interventions Patient/Family Education, Bladder Management, Bowel Management, Disease Management/Prevention, Pain Management, Medication Management, Skin Care/Wound Management, Dysphagia/Aspiration Precaution Training, Discharge Planning  PT interventions Ambulation/gait training, Balance/vestibular training, Cognitive remediation/compensation, DME/adaptive equipment instruction, Functional electrical stimulation, Patient/family education, Community reintegration, Pain management, Splinting/orthotics, UE/LE Strength taining/ROM, UE/LE Coordination activities, Therapeutic Exercise, Wheelchair propulsion/positioning, Visual/perceptual remediation/compensation, Therapeutic Activities, Psychosocial support, Skin care/wound management, Neuromuscular re-education, Disease management/prevention, Discharge planning, Functional mobility training, Stair training  OT Interventions Balance/vestibular training, Cognitive remediation/compensation, Discharge planning, DME/adaptive equipment instruction, Functional mobility training, Neuromuscular re-education, Psychosocial support, Pain management, Patient/family education, Self Care/advanced ADL retraining, Therapeutic Activities, Therapeutic Exercise, UE/LE Strength taining/ROM, UE/LE Coordination activities, Visual/perceptual remediation/compensation  SLP Interventions Cognitive remediation/compensation, Cueing hierarchy, Dysphagia/aspiration precaution training, Functional tasks, Internal/external aids, Patient/family education  TR Interventions    SW/CM Interventions Discharge Planning, Psychosocial Support, Patient/Family Education, Disease Management/Prevention   Barriers to Discharge MD  Medical stability, Home  enviroment access/loayout, Incontinence, Neurogenic bowel and bladder, Weight and Nutritional means  Nursing      PT Inaccessible home environment, New oxygen, Home environment access/layout, Lack of/limited family support    OT Incontinence    SLP Nutrition means NG tube  SW Home environment access/layout 4 step entry to the home   Team Discharge  Planning: Destination: PT-Skilled Nursing Facility (SNF) ,OT- Home , SLP-Home (versus SNF) Projected Follow-up: PT-Skilled nursing facility (may progress to home with 24/7 and HHPT ?), OT-  Home health OT, SLP-Home Health SLP, Skilled Nursing facility, 24 hour supervision/assistance Projected Equipment Needs: PT-To be determined, OT- 3 in 1 bedside comode, SLP-To be determined Equipment Details: PT-Pt has: RW and cane. Anticipate DME needs, OT-  Patient/family involved in discharge planning: PT- Family member/caregiver,  OT-Family member/caregiver, SLP-Patient, Family member/caregiver  MD ELOS: ~ 3weeks Medical Rehab Prognosis:  Fair Assessment: Pt is an 86 yr old female with hx of HTN and Afib admitted with L temporal and occipital and frontal ICH with dysphagia- is NPO with cortrak, also completing Rocephin for UTI IV, and restarting hydralazine due to elevated DBP>SBP.   Goals mod A. Has a lot of family support   See Team Conference Notes for weekly updates to the plan of care

## 2020-07-29 NOTE — Plan of Care (Signed)
  Problem: RH BLADDER ELIMINATION Goal: RH STG MANAGE BLADDER WITH ASSISTANCE Description: STG Manage Bladder With Assistance Outcome: Not Progressing; in and out cath

## 2020-07-29 NOTE — Progress Notes (Signed)
[name] PHYSICAL MEDICINE & REHABILITATION PROGRESS NOTE   Subjective/Complaints:  Pt is asleep- per daughter, pt's LBM was yesterday- was incontinent Also nursing reported pt's DBP was elevated still.      ROS: unable to assess due to cognition  Objective:   No results found. Recent Labs    07/27/20 0648  WBC 3.9*  HGB 13.6  HCT 43.9  PLT 133*   Recent Labs    07/27/20 0648  NA 146*  K 4.1  CL 115*  CO2 23  GLUCOSE 136*  BUN 31*  CREATININE 1.21*  CALCIUM 10.1    Intake/Output Summary (Last 24 hours) at 07/29/2020 1419 Last data filed at 07/29/2020 1030 Gross per 24 hour  Intake 0 ml  Output 1000 ml  Net -1000 ml     Physical Exam: Vital Signs Blood pressure (!) 149/111, pulse 96, temperature (!) 97.5 F (36.4 C), temperature source Axillary, resp. rate 18, height 5\' 6"  (1.676 m), weight 80.4 kg, SpO2 97 %. General: lethargic- sitting up but head lolling forward- asleep, NAD HEENT: cotrak in place- no change Neck: Supple without JVD or lymphadenopathy Heart: in afib, rate controlled Chest: CTA B/L- no W/R/R- good air movement Abdomen: Soft, NT, ND, (+)BS -normoactive Extremities: No clubbing, cyanosis, or edema. Pulses are 2+ Skin: Clean and intact without signs of breakdown- L hand 1+ swelling-is propped on pillow Neuro: pt asleep- woke very short periods, but fell right back to sleep- daughter at bedside Psych: sleepy/lethargic- no change Assessment/Plan: 1. 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  Physiatrist is providing close team supervision and 24 hour management of active medical problems listed below.  Physiatrist and rehab team continue to assess barriers to  discharge/monitor patient progress toward functional and medical goals  Care Tool:  Bathing  Bathing activity did not occur:  (did not occur on eval, as pt was not able to be aroused awake)           Bathing assist       Upper Body Dressing/Undressing Upper body dressing Upper body dressing/undressing activity did not occur (including orthotics):  (did not occur on eval, as pt was not able to be aroused awake) What is the patient wearing?: Hospital gown only    Upper body assist Assist Level: Total Assistance - Patient < 25%    Lower Body Dressing/Undressing Lower body dressing      What is the patient wearing?: Incontinence brief     Lower body assist Assist for lower body dressing: Total Assistance - Patient < 25%     Toileting Toileting    Toileting assist Assist for toileting: 2 Helpers     Transfers Chair/bed transfer  Transfers assist  Chair/bed transfer activity did not occur: Safety/medical concerns  Chair/bed transfer assist level: Dependent - mechanical lift     Locomotion Ambulation   Ambulation assist   Ambulation activity did not occur: Safety/medical concerns          Walk 10 feet activity   Assist  Walk 10 feet activity did not occur: Safety/medical concerns        Walk 50 feet activity   Assist Walk 50 feet with 2 turns activity did not occur: Safety/medical concerns         Walk 150  feet activity   Assist Walk 150 feet activity did not occur: Safety/medical concerns         Walk 10 feet on uneven surface  activity   Assist Walk 10 feet on uneven surfaces activity did not occur: Safety/medical concerns         Wheelchair     Assist Will patient use wheelchair at discharge?: Yes Type of Wheelchair: Manual Wheelchair activity did not occur: Safety/medical concerns         Wheelchair 50 feet with 2 turns activity    Assist    Wheelchair 50 feet with 2 turns activity did not occur:  Safety/medical concerns       Wheelchair 150 feet activity     Assist  Wheelchair 150 feet activity did not occur: Safety/medical concerns       Blood pressure (!) 149/111, pulse 96, temperature (!) 97.5 F (36.4 C), temperature source Axillary, resp. rate 18, height 5\' 6"  (1.676 m), weight 80.4 kg, SpO2 97 %.  Medical Problem List and Plan: 1.  Decreased functional mobility with altered mental status secondary to small left frontal medial ICH and left temporal occipital ICH in the setting of chronic Coumadin/likely hypertensive             -patient may shower             -ELOS/Goals: 27-30 days Min A in PT, OT, SLP  Initial IR evaluations today  7/31- needs L hand propped for some swelling 2.  Antithrombotics: -DVT/anticoagulation: Subcutaneous heparin for DVT prophylaxis initiated July 25, 2020             -antiplatelet therapy: N/A 3. Pain Management: Tylenol as needed  8/1- pain controlled- con't meds prn 4. Mood: D/c Ativan due to lethargy.              -antipsychotic agents: N/A 5. Neuropsych: This patient is not capable of making decisions on her own behalf. 6. Skin/Wound Care: Routine skin checks 7. Fluids/Electrolytes/Nutrition: Routine in and outs with follow-up chemistries 8.  Seizure prophylaxis.  Keppra 1000 mg twice daily.  EEG negative 9.  Atrial fibrillation.  Chronic Coumadin reversed.  Cardiac rate controlled. Continue Lopressor 50 mg twice daily 10.  Dysphagia.  Currently NPO with nasogastric tube feeds.  Dietary follow-up  7/31- con't cortrak- is in correct place per xrays  8/1- per SLP- will have her reassessed in next 1 week or so.  11.  Hypertension.  Lasix 20 mg daily, hydralazine 25 mg twice daily, Avapro 150 mg daily, Toprol XL 100 mg daily currently on hold.  Resume as needed            9/16: Diastolic elevated, continue to monitor.   7/31- BP 136/86- improved- con't regimen  8/1- restarting hydralazine 25 mg via tube BID- due to elevated SBP<DBP-  is 140s/111 this AM 12.  Hypothyroidism.  Synthroid 13.  Hyperlipidemia.  Statin on hold in the setting of ICH 14.  UTI/E. coli.  Completing course of Rocephin 15.  GERD.  Protonix 16. Disposition: She lives at home with her grandson. She has seven children nearby who can provide support upon discharge.   LOS: 3 days A FACE TO FACE EVALUATION WAS PERFORMED  Lori Bridges 07/29/2020, 2:19 PM

## 2020-07-30 ENCOUNTER — Inpatient Hospital Stay (HOSPITAL_COMMUNITY): Payer: Medicare Other

## 2020-07-30 ENCOUNTER — Inpatient Hospital Stay (HOSPITAL_COMMUNITY): Payer: Medicare Other | Admitting: Physical Therapy

## 2020-07-30 ENCOUNTER — Inpatient Hospital Stay (HOSPITAL_COMMUNITY): Payer: Medicare Other | Admitting: Occupational Therapy

## 2020-07-30 ENCOUNTER — Inpatient Hospital Stay (HOSPITAL_COMMUNITY): Payer: Medicare Other | Admitting: Speech Pathology

## 2020-07-30 DIAGNOSIS — I610 Nontraumatic intracerebral hemorrhage in hemisphere, subcortical: Secondary | ICD-10-CM | POA: Diagnosis not present

## 2020-07-30 NOTE — Progress Notes (Signed)
Physical Therapy Session Note  Patient Details  Name: Lori Bridges MRN: [id number] Date of Birth: 25-Oct-1930  Today's Date: 07/30/2020 PT Individual Time: 2947-6546 PT Individual Time Calculation (min): 36 min   Short Term Goals: Week 1:  PT Short Term Goal 1 (Week 1): Pt will complete bed mobility with maxA +1 in order to improve functional independence. PT Short Term Goal 2 (Week 1): Pt will tolerate sitting in TIS w/c for >2 hrs in order to strengthen activity tolerance PT Short Term Goal 3 (Week 1): Pt will safely perform Stedy transfer with maxA +1 from EOB <> w/c. PT Short Term Goal 4 (Week 1): Pt will maintain sitting EOB for >5 minutes with modA +1 without BUE support.  Skilled Therapeutic Interventions/Progress Updates:   Pt received supine in bed with eyes open and her daughter present with nursing staff having just finished assisting with peri-care. Pt remains nonverbal but appears agreeable to session. In supine, donned TED hose total assist. Noticed R LE increased tone into hip/knee flexion and hip external rotation with inability to perform full hip/knee extension PROM with flexor withdrawal. Rolling R/L with +2 total assist via bed pads with pt demoing no initiation to assist with mobility despite cuing. Total assist to don pants. Therapist focused on locating pt a wider TIS w/c to improve fit to allow increased, upright OOB activity tolerance - attempted to locate hybrid Roho cushion for increased pressure relief and improved positioning but due to no incontinence covers available had to use different cushion at this time - provided pt with full lap tray to increase safety and to position R UE while sitting. Due to time constraints unable to assist pt to wheelchair at this time. Pt left supine in bed with needs in reach, bed alarm on, family present, and RN present to assume care of patient.  Therapy Documentation Precautions:  Precautions Precautions: Fall Restrictions Weight  Bearing Restrictions: No Other Position/Activity Restrictions: NPO with NGT. B soft rest restraints.  Pain:   Possible indication of pain/discomfort/sensitivity in R LE while donning TED hose but difficult to determine as pt nonverbal.  Therapy/Group: Individual Therapy  Tawana Scale , PT, DPT, CSRS  07/30/2020, 7:48 AM

## 2020-07-30 NOTE — Progress Notes (Signed)
[place] PHYSICAL MEDICINE & REHABILITATION PROGRESS NOTE   Subjective/Complaints: Daughters at bedside  Pt without spontaneous speech but according to RN will at times say a chort phrase   ROS: unable to assess due to cognition  Objective:   No results found. No results for input(s): WBC, HGB, HCT, PLT in the last 72 hours. No results for input(s): NA, K, CL, CO2, GLUCOSE, BUN, CREATININE, CALCIUM in the last 72 hours.  Intake/Output Summary (Last 24 hours) at 07/30/2020 1056 Last data filed at 07/30/2020 0917 Gross per 24 hour  Intake 541.25 ml  Output 500 ml  Net 41.25 ml     Physical Exam: Vital Signs Blood pressure 122/76, pulse 86, temperature 98.9 F (37.2 C), temperature source Oral, resp. rate 14, height 5\' 6"  (1.676 m), weight 70.1 kg, SpO2 96 %.  General: No acute distress Mood and affect are appropriate Heart: Regular rate and rhythm no rubs murmurs or extra sounds Lungs: Clear to auscultation, breathing unlabored, no rales or wheezes Abdomen: Positive bowel sounds, soft nontender to palpation, nondistended Extremities: No clubbing, cyanosis, or edema Skin: No evidence of breakdown, no evidence of rash  General: No acute distress Mood and affect are appropriate Heart: Regular rate and rhythm no rubs murmurs or extra sounds Lungs: Clear to auscultation, breathing unlabored, no rales or wheezes Abdomen: Positive bowel sounds, soft nontender to palpation, nondistended Extremities: No clubbing, cyanosis, or edema Skin: No evidence of breakdown, no evidence of rash Neurologic: alert,  Essentially non verbal. Withdraws to pinch on RIght side in UE and LE, MMT difficult to perform due to cognition, trace grip o/w 0/5 RUE, 2- hip/knee ext synergy on RIght sdie   Assessment/Plan: 1. Functional deficits secondary to decreased functional mobility with altered mental status secondary to small left frontal medial ICH and left temporal occipital ICH in the setting of  chronic Coumadin/likely hypertensive, which requires 3+ hours per day of interdisciplinary therapy in a comprehensive inpatient rehab setting.  Physiatrist is providing close team supervision and 24 hour management of active medical problems listed below.  Physiatrist and rehab team continue to assess barriers to discharge/monitor patient progress toward functional and medical goals  Care Tool:  Bathing  Bathing activity did not occur: Safety/medical concerns (did not occur on eval, as pt was not able to be aroused awake) Body parts bathed by patient:  (did not occur on eval, as pt was not able to be aroused awak)         Bathing assist       Upper Body Dressing/Undressing Upper body dressing Upper body dressing/undressing activity did not occur (including orthotics):  (did not occur on eval, as pt was not able to be aroused awake) What is the patient wearing?: Hospital gown only    Upper body assist Assist Level: Total Assistance - Patient < 25%    Lower Body Dressing/Undressing Lower body dressing      What is the patient wearing?: Incontinence brief     Lower body assist Assist for lower body dressing: Total Assistance - Patient < 25%     Toileting Toileting    Toileting assist Assist for toileting: 2 Helpers     Transfers Chair/bed transfer  Transfers assist  Chair/bed transfer activity did not occur: Safety/medical concerns  Chair/bed transfer assist level: Dependent - mechanical lift     Locomotion Ambulation   Ambulation assist   Ambulation activity did not occur: Safety/medical concerns          Walk 10  feet activity   Assist  Walk 10 feet activity did not occur: Safety/medical concerns        Walk 50 feet activity   Assist Walk 50 feet with 2 turns activity did not occur: Safety/medical concerns         Walk 150 feet activity   Assist Walk 150 feet activity did not occur: Safety/medical concerns         Walk 10 feet on  uneven surface  activity   Assist Walk 10 feet on uneven surfaces activity did not occur: Safety/medical concerns         Wheelchair     Assist Will patient use wheelchair at discharge?: Yes Type of Wheelchair: Manual Wheelchair activity did not occur: Safety/medical concerns         Wheelchair 50 feet with 2 turns activity    Assist    Wheelchair 50 feet with 2 turns activity did not occur: Safety/medical concerns       Wheelchair 150 feet activity     Assist  Wheelchair 150 feet activity did not occur: Safety/medical concerns       Blood pressure 122/76, pulse 86, temperature 98.9 F (37.2 C), temperature source Oral, resp. rate 14, height 5\' 6"  (1.676 m), weight 70.1 kg, SpO2 96 %.  Medical Problem List and Plan: 1.  Decreased functional mobility with altered mental status secondary to small left frontal medial ICH and left temporal occipital ICH in the setting of chronic Coumadin/likely hypertensive             -patient may shower             -ELOS/Goals: 27-30 days Min A in PT, OT, SLP  Initial IR evaluations today  7/31- needs L hand propped for some swelling 2.  Antithrombotics: -DVT/anticoagulation: Subcutaneous heparin for DVT prophylaxis initiated July 25, 2020             -antiplatelet therapy: N/A 3. Pain Management: Tylenol as needed  8/1- pain controlled- con't meds prn 4. Mood: D/c Ativan due to lethargy.              -antipsychotic agents: N/A 5. Neuropsych: This patient is not capable of making decisions on her own behalf. 6. Skin/Wound Care: Routine skin checks 7. Fluids/Electrolytes/Nutrition: Routine in and outs with follow-up chemistries 8.  Seizure prophylaxis.  Keppra 1000 mg twice daily.  EEG negative 9.  Atrial fibrillation.  Chronic Coumadin reversed.  Cardiac rate controlled. Continue Lopressor 50 mg twice daily 10.  Dysphagia.  Currently NPO with nasogastric tube feeds.  Dietary follow-up  7/31- con't cortrak- is in  correct place per xrays  8/1- per SLP- will have her reassessed in next 1 week or so.  11.  Hypertension.  Lasix 20 mg daily, hydralazine 25 mg twice daily, Avapro 150 mg daily, Toprol XL 100 mg daily currently on hold.  Resume as needed Vitals:   07/29/20 2110 07/30/20 0922  BP: 117/76 122/76  Pulse: 88 86  Resp: 14   Temp: 98.9 F (37.2 C)   SpO2: 96%    12.  Hypothyroidism.  Synthroid 13.  Hyperlipidemia.  Statin on hold in the setting of ICH 14.  UTI/E. coli.  Completing course of Rocephin 15.  GERD.  Protonix 16. Disposition: She lives at home with her grandson. She has seven children nearby who can provide support upon discharge.   LOS: 4 days A FACE TO FACE EVALUATION WAS PERFORMED  Charlett Blake 07/30/2020, 10:56 AM

## 2020-07-30 NOTE — Progress Notes (Signed)
Speech Language Pathology Daily Session Note  Patient Details  Name: Lori Bridges MRN: [id number] Date of Birth: Dec 08, 1930  Today's Date: 07/30/2020 SLP Individual Time: 6160-7371 SLP Individual Time Calculation (min): 24 min  Short Term Goals: Week 1: SLP Short Term Goal 1 (Week 1): Pt will remain awake (eyes opened) for 5-7 minute intervals with max assist multimodal cues. SLP Short Term Goal 2 (Week 1): Pt will sustain her attention to basic, familiar tasks for 1 minute intervals with max assist multimodal cues. SLP Short Term Goal 3 (Week 1): Pt will complete basic, familiar tasks with max assist for functional problem solving. SLP Short Term Goal 4 (Week 1): Pt will consume therapeutic trials of thin liquids and purees with max assist multimodal cues for swallow initiation and without respiratory decompensation over 3 consecutive sessions.  Skilled Therapeutic Interventions: Pt was seen for skilled ST targeting goals for dysphagia. [name] was present for session and provided more information regarding baseline swallowing and cognitive-linguistic function. She reports pt's communication deficits and decreased verbal initiation is new in setting of acute CVA. She reports pt has always needed to use a water bottle with spout or straw for thin consumption but no oral holding noted prior to this hospitalization. Pt allowed SLP to perform brief oral care via suction, and accepted minimal trials of thin H2O via straw and pureed applesauce via straw. No overt s/sx aspiration noted during intake across textures. Oral holding noted X1 on initial sip of thin. Swallow initiation occurred after ~1-1.5 minutes with Max A verbal cues from clinician and daughter. No further oral holding noted with additional trials of thin or with puree. Pt's bite size of puree was very small, and intake very limited. Although she may be ready to initiate a PO diet in the near future, she will required full supervision  and do to very limited quantity of intake during trials, she may struggle to meet nutritional needs with PO diet. Discussed with daughter. Pt left laying in bed with alarm set and and needs within reach, daughter still present, and NT on the way to check her brief. Continue per current plan of care.          Pain Pain Assessment Pain Scale: Faces Faces Pain Scale: No hurt  Therapy/Group: Individual Therapy  [name] 07/30/2020, 12:05 PM

## 2020-07-30 NOTE — Progress Notes (Signed)
Physical Therapy Session Note  Patient Details  Name: Lori Bridges MRN: [id number] Date of Birth: [birth year]  Today's Date: 07/30/2020 PT Individual Time: 1300-1330 PT Individual Time Calculation (min): 30 min   Short Term Goals: Week 1:  PT Short Term Goal 1 (Week 1): Pt will complete bed mobility with maxA +1 in order to improve functional independence. PT Short Term Goal 2 (Week 1): Pt will tolerate sitting in TIS w/c for >2 hrs in order to strengthen activity tolerance PT Short Term Goal 3 (Week 1): Pt will safely perform Stedy transfer with maxA +1 from EOB <> w/c. PT Short Term Goal 4 (Week 1): Pt will maintain sitting EOB for >5 minutes with modA +1 without BUE support.  Skilled Therapeutic Interventions/Progress Updates:    Pt received supine in bed, [name] at bedside. On arrival, daughter reports that pt is soiled and needs changed/cleaned. Therapy tech present for +2 assist. Pt with large BM with mushy/liquid consistency, daughter endorses this is the 3rd BM today of this nature. Pt required totalA +2 for all bed mobility including multiple bouts of rolling L<>R and totalA +1 for maintaining sidelying position. TotalA +2-3 for pericare and linen change. Unfortunately, due to assist level for pericare and time constraints (48min) of session, unable to progress OOB or EOB mobility, will continue efforts. Pt nonverbal throughout session although eyes open throughout, a few incoherent grunts/moans during turns and cleaning. Without LUE wrist restraint, pt will multiple efforts of pulling DHT with LUE, requiring continuous HOH A to prevent. Pt therapeutically positioned at end of session, bed alarm on, rest restraint donned, bed rails up, daughter at bedside.   Therapy Documentation Precautions:  Precautions Precautions: Fall Restrictions Weight Bearing Restrictions: No Other Position/Activity Restrictions: NPO with NGT. B soft rest restraints. Vital Signs: Therapy  Vitals Temp: 98.4 F (36.9 C) Temp Source: Oral Pulse Rate: 99 Resp: 16 BP: 127/83 Patient Position (if appropriate): Lying Oxygen Therapy O2 Device: Room Air Pain: Pain Assessment Pain Scale: Faces Pain Score: 0-No pain Faces Pain Scale: No hurt  Therapy/Group: Individual Therapy  Alger Simons PT, DPT 07/30/2020, 3:31 PM

## 2020-07-30 NOTE — Progress Notes (Signed)
Occupational Therapy Session Note  Patient Details  Name: Lori Bridges MRN: [id number] Date of Birth: December 11, 1930  Today's Date: 07/30/2020 OT Individual Time: 1000-1045 OT Individual Time Calculation (min): 45 min    Short Term Goals: Week 1:  OT Short Term Goal 1 (Week 1): Pt will demonstrate alertness to engage in UB bathing with max A. OT Short Term Goal 2 (Week 1): Pt will follow 1 step commands with 50 % accuracy with donning UB clothing with max A. OT Short Term Goal 3 (Week 1): Pt will sit to EOB with max a. OT Short Term Goal 4 (Week 1): Pt will maintain static sitting for 5 min at EOB with mod A to prepare her for sitting on a BSC.  Skilled Therapeutic Interventions/Progress Updates:    Pt received in bed with daughter in the room. Pt more alert today keeping eyes open but would not respond to questions from therapist most of the time.  Pt did express that she did not like many of the therapy acitivites saying "just stop!".  In bed, worked on PROM of RUE and RLE.  Pt has developed mod hypertone with difficulty moving extremities through passive range.    Rolling in bed with total A as pt resisting movements and not able to sustain attention to do things such as pulling on rails and or holding R hand in L and.   Total A of 2 to sit to EOB with pt exhibiting severe posterior lean and lean to R.  Total A of 2 to have pt sit upright and lean forward. Placed L hand on end rail of bed.  Pt kept asking [name] to stop.  Positioned back in supine and tried to engage pt in visual tracking. Her eyes do not move past midline to the R and she exhibits severe R neglect.     Discussed with daughter OTs goals for self care but she needs to spend a lot of time developing postural control and perceptual skills to truly be able to engage.    Pt resting in bed with all needs met. Wrist restraints off as daughter monitoring her mother.     Therapy Documentation Precautions:   Precautions Precautions: Fall Restrictions Weight Bearing Restrictions: No Other Position/Activity Restrictions: NPO with NGT. B soft rest restraints.    Vital Signs: Therapy Vitals Pulse Rate: 86 BP: 122/76 Pain: Pain Assessment Pain Scale: Faces Pain Score: 0-No pain Faces Pain Scale: No hurt   Therapy/Group: Individual Therapy  Jovie Swanner 07/30/2020, 12:20 PM

## 2020-07-30 NOTE — Progress Notes (Signed)
Occupational Therapy Session Note  Patient Details  Name: Lori Bridges MRN: [id number] Date of Birth: 12-17-1930  Today's Date: 07/30/2020 OT Individual Time: 1410-1425 OT Individual Time Calculation (min): 15 min    Short Term Goals: Week 1:  OT Short Term Goal 1 (Week 1): Pt will demonstrate alertness to engage in UB bathing with max A. OT Short Term Goal 2 (Week 1): Pt will follow 1 step commands with 50 % accuracy with donning UB clothing with max A. OT Short Term Goal 3 (Week 1): Pt will sit to EOB with max a. OT Short Term Goal 4 (Week 1): Pt will maintain static sitting for 5 min at EOB with mod A to prepare her for sitting on a BSC.  Skilled Therapeutic Interventions/Progress Updates:    Pt asleep in bed with dtr at bedside upon OT arrival.  Per dtr, pt just received sponge bath and did a lot of rolling 30 minutes prior and probably feels tired now.  Provision of PROM to right shoulder, elbow, wrist, and hand to reduce tone and prevent joint contractures.  Provided max simple VCs and TCs in attempt to facilitate increased pt arousal and participation for AAROM of [place].  Pt opened eyes for a few seconds then closed again with no active movement noted in RUE.  Pt provided wet wash cloth with HOH right to wash face needing total assist.  Discontinued OT session due to pt with significant fatigue with inability to arouse.  Call bell in reach, bed alarm on, wrist restraint donned.  45 minutes missed treatment.  Therapy Documentation Precautions:  Precautions Precautions: Fall Restrictions Weight Bearing Restrictions: No Other Position/Activity Restrictions: NPO with NGT. B soft rest restraints.   Therapy/Group: Individual Therapy  Ezekiel Slocumb 07/30/2020, 10:25 AM

## 2020-07-31 ENCOUNTER — Inpatient Hospital Stay (HOSPITAL_COMMUNITY): Payer: Medicare Other | Admitting: Occupational Therapy

## 2020-07-31 ENCOUNTER — Inpatient Hospital Stay (HOSPITAL_COMMUNITY): Payer: Medicare Other

## 2020-07-31 ENCOUNTER — Inpatient Hospital Stay (HOSPITAL_COMMUNITY): Payer: Medicare Other | Admitting: Speech Pathology

## 2020-07-31 DIAGNOSIS — Z515 Encounter for palliative care: Secondary | ICD-10-CM

## 2020-07-31 DIAGNOSIS — I10 Essential (primary) hypertension: Secondary | ICD-10-CM

## 2020-07-31 DIAGNOSIS — Z298 Encounter for other specified prophylactic measures: Secondary | ICD-10-CM

## 2020-07-31 DIAGNOSIS — I480 Paroxysmal atrial fibrillation: Secondary | ICD-10-CM

## 2020-07-31 DIAGNOSIS — I69391 Dysphagia following cerebral infarction: Secondary | ICD-10-CM

## 2020-07-31 DIAGNOSIS — K219 Gastro-esophageal reflux disease without esophagitis: Secondary | ICD-10-CM

## 2020-07-31 NOTE — Progress Notes (Signed)
Speech Language Pathology Daily Session Note  Patient Details  Name: Lori Bridges MRN: [id number] Date of Birth: 27-Jul-1930  Today's Date: 07/31/2020 SLP Individual Time: 5597-4163 SLP Individual Time Calculation (min): 45 min  Short Term Goals: Week 1: SLP Short Term Goal 1 (Week 1): Pt will remain awake (eyes opened) for 5-7 minute intervals with max assist multimodal cues. SLP Short Term Goal 2 (Week 1): Pt will sustain her attention to basic, familiar tasks for 1 minute intervals with max assist multimodal cues. SLP Short Term Goal 3 (Week 1): Pt will complete basic, familiar tasks with max assist for functional problem solving. SLP Short Term Goal 4 (Week 1): Pt will consume therapeutic trials of thin liquids and purees with max assist multimodal cues for swallow initiation and without respiratory decompensation over 3 consecutive sessions.  Skilled Therapeutic Interventions:   Skilled therapeutic intervention focused on dysphagia and cognition. Daughter present during session. Pt was alert and participated well with po trials throughout session but did not respond verbally or with gestures to simple questions asked during session. Total assist by slp with oral care. Pt did not follow commands to open mouth but eventually allowed slp to press oral swab between lips to clean mouth prior to po trials. Pt held water in mouth with straw sip of thin liquid for first 3 trials and initiated swallow 20-35 seconds after taking a sip. Pt required cues from slp to swallow and was able to clear all liquid with multiple swallows when given additional time. Increased po acceptance as session continued. Pt opened lips very minimally and accepts 1/4 tsp from spoon with pureed consistency. Pt consumed 4 oz of pureed snack over 45 minute session. Swallow initiation appeared more timely as session continued. Pt remained awake for entire session. Pt required mod assistance to attend to po trials during session. No  overt signs or symptoms of aspiration or penetration noted during session. Pt left upright in bed with bed alarm on call bell and needs within reach. Will continue po trials next session to work towards diet initiation. Daughter in agreement with plan.   Pain Pain Assessment Pain Scale: 0-10 Pain Score: 0-No pain  Therapy/Group: Individual Therapy  Darrol Poke Lori Bridges 07/31/2020, 2:39 PM

## 2020-07-31 NOTE — Progress Notes (Signed)
Physical Therapy Session Note  Patient Details  Name: DENYA BUCKINGHAM MRN: [id number] Date of Birth: [birth year]  Today's Date: 07/31/2020 PT Individual Time: 0800-0900 PT Individual Time Calculation (min): 60 min   Short Term Goals: Week 1:  PT Short Term Goal 1 (Week 1): Pt will complete bed mobility with maxA +1 in order to improve functional independence. PT Short Term Goal 2 (Week 1): Pt will tolerate sitting in TIS w/c for >2 hrs in order to strengthen activity tolerance PT Short Term Goal 3 (Week 1): Pt will safely perform Stedy transfer with maxA +1 from EOB <> w/c. PT Short Term Goal 4 (Week 1): Pt will maintain sitting EOB for >5 minutes with modA +1 without BUE support.  Skilled Therapeutic Interventions/Progress Updates:    Pt received supine in bed, appears agreeable to PT session. Therapy tech present for +2 mobility. B wrist restrained doffed at start of session, pt without attempts to reach for DHT with LUE. Pt with eyes open on arrival, L gaze preference with R neglect, nonverbal throughout session. TED hose, socks, and pajama pants donned totalA. Required totalA +2 for rolling L<>R with HOB flat for lower body dressing, no initiation efforts noted despite cues. Pt performed supine<>sit with maxA +2, requiring totalA for seated scoot's for feet flat. While sitting EOB, pt initially requiring maxA +2 due to posterior and R lateral lean. Performed neuro-re ed lateral elbow leans 1x5 bilaterally. After repetitions, pt was able to initiate return from L elbow lean to midline, unable while on R elbow. Performed lateral reaching with LUE to footboard where pt was able to sustain grasp and maintain sitting with CGA for ~30 second bouts prior to L grasp fatigue. Performed x2 sit<>stand transfers from raised EOB with maxA +2 and B knee block, cues for trunk and cervical extension for upright posture which pt was able to briefly achieve. Performed x4 sit<>stands with Sera Stedy with maxA +2, pt  able to sustain grasp bilaterally and achieve full upright posture with cues. Pt transferred with stedy to TIS w/c where pt was positioned with lap tray and pillows for added stability, wrist restraints donned. Multiple efforts of R cervical rotation to find midline throughout session. Daughter entered at end of session and encouraged her to place herself to the R of the patient to encourage R attention. Pt ended session seated in TIS recliner, daughter present, needs in reach.   Therapy Documentation Precautions:  Precautions Precautions: Fall Restrictions Weight Bearing Restrictions: No Other Position/Activity Restrictions: NPO with NGT. B soft rest restraints. Vital Signs: Therapy Vitals Pulse Rate: (!) 103 BP: (!) 120/96 Patient Position (if appropriate): Lying Oxygen Therapy SpO2: 96 % O2 Device: Room Air  Therapy/Group: Individual Therapy  Hairo Garraway P Jaylyne Breese PT 07/31/2020, 7:48 AM

## 2020-07-31 NOTE — Progress Notes (Signed)
[facility] PHYSICAL MEDICINE & REHABILITATION PROGRESS NOTE  Subjective/Complaints: Patient seen laying in bed this am.  No reported issues overnight.    ROS: Limited due to cognitoin.   Objective: Vital Signs: Blood pressure (!) 120/96, pulse (!) 103, temperature 98.3 F (36.8 C), resp. rate 16, height 5\' 6"  (1.676 m), weight 81.3 kg, SpO2 96 %. No results found. No results for input(s): WBC, HGB, HCT, PLT in the last 72 hours. No results for input(s): NA, K, CL, CO2, GLUCOSE, BUN, CREATININE, CALCIUM in the last 72 hours.  Physical Exam: BP (!) 120/96 (BP Location: Left Arm)   Pulse (!) 103   Temp 98.3 F (36.8 C)   Resp 16   Ht 5\' 6"  (1.676 m)   Wt 81.3 kg   SpO2 96%   BMI 28.93 kg/m  Constitutional: No distress . Vital signs reviewed. HENT: Normocephalic.  Atraumatic. Eyes: EOMI. No discharge. Cardiovascular: No JVD. Respiratory: Normal effort.  No stridor. GI: Non-distended. Skin: Warm and dry.  Intact. Psych: Normal mood.  Normal behavior. Musc: Right sided edema. No tenderness noted. Neuro: Alert Motor: no spontaneous movement noted.  Assessment/Plan: 1. 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  Physiatrist is providing close team supervision and 24 hour management of active medical problems listed below.  Physiatrist and rehab team continue to assess barriers to discharge/monitor patient progress toward functional and medical goals  Care Tool:  Bathing  Bathing activity did not occur: Safety/medical concerns (did not occur on eval, as pt was not able to be aroused awake) Body parts bathed by patient:  (did not occur on eval, as pt was not able to be aroused awak)         Bathing assist       Upper Body Dressing/Undressing Upper body dressing  Upper body dressing/undressing activity did not occur (including orthotics):  (did not occur on eval, as pt was not able to be aroused awake) What is the patient wearing?: Hospital gown only    Upper body assist Assist Level: Total Assistance - Patient < 25%    Lower Body Dressing/Undressing Lower body dressing      What is the patient wearing?: Incontinence brief     Lower body assist Assist for lower body dressing: Total Assistance - Patient < 25%     Toileting Toileting    Toileting assist Assist for toileting: 2 Helpers     Transfers Chair/bed transfer  Transfers assist  Chair/bed transfer activity did not occur: Safety/medical concerns  Chair/bed transfer assist level: Dependent - mechanical lift     Locomotion Ambulation   Ambulation assist   Ambulation activity did not occur: Safety/medical concerns          Walk 10 feet activity   Assist  Walk 10 feet activity did not occur: Safety/medical concerns        Walk 50 feet activity   Assist Walk 50 feet with 2 turns activity did not occur: Safety/medical concerns         Walk 150 feet activity   Assist Walk 150 feet activity did not occur: Safety/medical concerns         Walk 10 feet on uneven surface  activity   Assist Walk 10 feet on uneven surfaces activity did not occur: Safety/medical concerns  Wheelchair     Assist Will patient use wheelchair at discharge?: Yes Type of Wheelchair: Manual Wheelchair activity did not occur: Safety/medical concerns         Wheelchair 50 feet with 2 turns activity    Assist    Wheelchair 50 feet with 2 turns activity did not occur: Safety/medical concerns       Wheelchair 150 feet activity     Assist Wheelchair 150 feet activity did not occur: Safety/medical concerns          Medical Problem List and Plan: 1.  Decreased functional mobility with altered mental status secondary to small left frontal medial  ICH and left temporal occipital ICH in the setting of chronic Coumadin/likely hypertensive  Continue CIR 2.  Antithrombotics: -DVT/anticoagulation: Subcutaneous heparin for DVT prophylaxis initiated July 25, 2020             -antiplatelet therapy: N/A 3. Pain Management: Tylenol as needed  Appears controlled on 8/3 4. Mood: D/c Ativan due to lethargy.              -antipsychotic agents: N/A 5. Neuropsych: This patient is not capable of making decisions on her own behalf.  Continue wrist restraints for safety 6. Skin/Wound Care: Routine skin checks 7. Fluids/Electrolytes/Nutrition: Routine in and outs. 8.  Seizure prophylaxis.  Keppra 1000 mg twice daily.  EEG negative  Will plan to wean 9.  Atrial fibrillation.  Chronic Coumadin reversed.  Cardiac rate controlled. Continue Lopressor 75 mg twice daily  Controlled on 8/3 10. Post stroke dysphagia.  Currently NPO with nasogastric tube feeds.  Dietary follow-up  Advance diet as tolerated 11.  Hypertension.  Lasix 20 mg daily, Avapro 150 mg daily, Toprol XL 100 mg daily currently on hold.    Continue hydralazine 25 mg twice daily,   Continue Lopressor 75 BID  Resume as needed 12.  Hypothyroidism.  Synthroid 13.  Hyperlipidemia.  Statin on hold in the setting of ICH 14.  UTI/E. coli.  Completed course of Rocephin 15.  GERD.  Continue Protonix   LOS: 5 days A FACE TO FACE EVALUATION WAS PERFORMED  Lori Bridges Lori Bridges 07/31/2020, 9:49 AM

## 2020-07-31 NOTE — Progress Notes (Signed)
Occupational Therapy Session Note  Patient Details  Name: Lori Bridges MRN: [id number] Date of Birth: 04/24/1930  Today's Date: 07/31/2020 OT Individual Time: 1003-1105 OT Individual Time Calculation (min): 62 min    Short Term Goals: Week 1:  OT Short Term Goal 1 (Week 1): Pt will demonstrate alertness to engage in UB bathing with max A. OT Short Term Goal 2 (Week 1): Pt will follow 1 step commands with 50 % accuracy with donning UB clothing with max A. OT Short Term Goal 3 (Week 1): Pt will sit to EOB with max a. OT Short Term Goal 4 (Week 1): Pt will maintain static sitting for 5 min at EOB with mod A to prepare her for sitting on a BSC.  Skilled Therapeutic Interventions/Progress Updates:    Pt sitting up in w/c with eyes open significant leftward gaze.  Pt relatively nonverbal throughout session except to say "What?!" in reaction to OT's VCs intermittently throughout.  Pts dtr reports pt used to love to read her Bible and complete word puzzles, OT encouraged dtr to bring items with meaning to utilize next session to facilitate increased orientation and attention.    Pt positioned at sinkside and max VCs and TCs provided to facilitate midline eye gaze with only poor follow through.  Pt easily distracted to left side of room including housekeeping entering.  OT pulled curtain on left side to reduce distraction.  Repetitive simple cues provided on pts left preferred side to determine if pt able to follow one step command to squeeze water out of washcloth.  Pt unable to follow commands to complete successfully therefore hand over hand provided to give tactile feedback and improve performance.  Total assist needed for UB bathing and dressing (with nurse present to disconnect NG tube temporarily) despite intermittent cues provided.    Pt transported to small gym and standing activity completed in standing frame.  Pt tolerated standing x 3 minutes and bright orange cone placed at midline with max  simple one step command and visual cues provided to pick up using LUE.  Pt occasionally attending to midline briefly then returning to left gaze and unable to follow one step command.  Pt returned to room, left wrist restraint donned, call bell within reach, lap tray placed, dtr in room.    Therapy Documentation Precautions:  Precautions Precautions: Fall Restrictions Weight Bearing Restrictions: No Other Position/Activity Restrictions: NPO with NGT. B soft rest restraints.   Therapy/Group: Individual Therapy  Ezekiel Slocumb 07/31/2020, 12:36 PM

## 2020-07-31 NOTE — Progress Notes (Signed)
Occupational Therapy Session Note  Patient Details  Name: Lori Bridges MRN: [id number] Date of Birth: 1930/05/28  Today's Date: 07/31/2020 OT Individual Time: 1500-1530 OT Individual Time Calculation (min): 30 min    Short Term Goals: Week 1:  OT Short Term Goal 1 (Week 1): Pt will demonstrate alertness to engage in UB bathing with max A. OT Short Term Goal 2 (Week 1): Pt will follow 1 step commands with 50 % accuracy with donning UB clothing with max A. OT Short Term Goal 3 (Week 1): Pt will sit to EOB with max a. OT Short Term Goal 4 (Week 1): Pt will maintain static sitting for 5 min at EOB with mod A to prepare her for sitting on a BSC.  Skilled Therapeutic Interventions/Progress Updates:  Pt supine in bed with dtr at left side; pts eyes open, observed rubbing stomach repetitively.  Pt response to dtr asking if she had any pain was "no", however questionnable comprehension.  Educated pts dtr on importance and benefits of sitting and engaging pt on right side to facilitate increased right sided attention.  Pt noted to have incontinence of bowel, loose runny stool.  Pt required total assist +2 for rolling left to right, clothing mgt, and pericare.  OT provided max simple VCs and TCs to increase RUE use of bed rails to help with bed mobility.  Pt resistive to all OTs efforts and pulling her RUE away multiple times.  Pt noted to have small red open area at border of rectum.  Nurse notified of skin presentation.  Pt missed 13 minutes of treatment secondary to fatigue with poor participation and nursing care.    Therapy Documentation Precautions:  Precautions Precautions: Fall Restrictions Weight Bearing Restrictions: No Other Position/Activity Restrictions: NPO with NGT. B soft rest restraints.      Therapy/Group: Individual Therapy  Ezekiel Slocumb 07/31/2020, 5:08 PM

## 2020-07-31 NOTE — Plan of Care (Signed)
Pt's plan of care adjusted to 15/7 after speaking with care team and discussed with MD in team conference as pt currently unable to tolerate current therapy schedule with OT, PT, and SLP.   

## 2020-08-01 ENCOUNTER — Inpatient Hospital Stay (HOSPITAL_COMMUNITY): Payer: Medicare Other

## 2020-08-01 ENCOUNTER — Inpatient Hospital Stay (HOSPITAL_COMMUNITY): Payer: Medicare Other | Admitting: Speech Pathology

## 2020-08-01 ENCOUNTER — Inpatient Hospital Stay (HOSPITAL_COMMUNITY): Payer: Medicare Other | Admitting: Occupational Therapy

## 2020-08-01 DIAGNOSIS — D696 Thrombocytopenia, unspecified: Secondary | ICD-10-CM

## 2020-08-01 DIAGNOSIS — E870 Hyperosmolality and hypernatremia: Secondary | ICD-10-CM

## 2020-08-01 DIAGNOSIS — I4891 Unspecified atrial fibrillation: Secondary | ICD-10-CM

## 2020-08-01 NOTE — Progress Notes (Signed)
Physical Therapy Session Note  Patient Details  Name: Lori Bridges MRN: [id number] Date of Birth: 1930/12/01  Today's Date: 08/01/2020 PT Individual Time: 4580-9983 PT Individual Time Calculation (min): 30 min   Short Term Goals: Week 1:  PT Short Term Goal 1 (Week 1): Pt will complete bed mobility with maxA +1 in order to improve functional independence. PT Short Term Goal 2 (Week 1): Pt will tolerate sitting in TIS w/c for >2 hrs in order to strengthen activity tolerance PT Short Term Goal 3 (Week 1): Pt will safely perform Stedy transfer with maxA +1 from EOB <> w/c. PT Short Term Goal 4 (Week 1): Pt will maintain sitting EOB for >5 minutes with modA +1 without BUE support.  Skilled Therapeutic Interventions/Progress Updates:     Pt received supine in bed. Nods agreement to therapy. No apparent pain. Pt performs bilateral rolling with maxA +1 to don pants. MaxA +1 for supine to sit. TotalA +2 for squat pivot/lateral transfer to TIS and then from TIS to mat table. Pt able to maintain sitting balance on mat table with CGA. Mirror placed for visual feedback and to encourage pt to attend to R side. Pt performs targeted reaching toward R side for horseshoes. Needs max verbal cuing and max manual facilitation to attend to R side and reach across midline for horseshoes. When reaching to L pt only requires min cuing and is able to reach without assistance.   TotalA +2 for lateral transfer from mat>TIS>bed. MaxA +2 for sit to supine. Pt left supine in bed with LUE in soft wrist restraint. Daughter present.  Therapy Documentation Precautions:  Precautions Precautions: Fall Restrictions Weight Bearing Restrictions: No Other Position/Activity Restrictions: NPO with NGT. B soft rest restraints.    Therapy/Group: Individual Therapy  Breck Coons, PT, DPT 08/01/2020, 3:50 PM

## 2020-08-01 NOTE — Progress Notes (Signed)
Patient ID: Lori Bridges, female   DOB: 05-12-1930, 86 y.o.   MRN: [id number]  SW met with pt and pt dtr who was in the room to provide updates from team conference,and d.c date 8/25. She reported the primary contact for this d/c will be her sister Lori Bridges [phone]). She reports pt will d/c to her home, and will have 24/7 care from various children who will assist with meeting her care needs. SW will follow-up with her sister to discuss further.   [name], MSW, [place] Office: [phone] Cell: [phone] Fax: [phone]

## 2020-08-01 NOTE — Progress Notes (Signed)
Patient pulled cortrak out this morning at 0600. Charge nurse was made aware. PA Reesa Chew was called and ordered new cortrak placement. Patient bed in lowest position and call bell at bedside.

## 2020-08-01 NOTE — Progress Notes (Signed)
Orthopedic Tech Progress Note Patient Details:  Lori Bridges [birth year] [id number] Called in brace. Patient ID: Lori Bridges, female   DOB: [birth year], 86 y.o.   MRN: [id number]   Lori Bridges 08/01/2020, 10:31 AM

## 2020-08-01 NOTE — Progress Notes (Signed)
Speech Language Pathology Daily Session Note  Patient Details  Name: Lori Bridges MRN: [id number] Date of Birth: 1930/12/27  Today's Date: 08/01/2020 SLP Individual Time: 1300-1356 SLP Individual Time Calculation (min): 56 min  Short Term Goals: Week 1: SLP Short Term Goal 1 (Week 1): Pt will remain awake (eyes opened) for 5-7 minute intervals with max assist multimodal cues. SLP Short Term Goal 2 (Week 1): Pt will sustain her attention to basic, familiar tasks for 1 minute intervals with max assist multimodal cues. SLP Short Term Goal 3 (Week 1): Pt will complete basic, familiar tasks with max assist for functional problem solving. SLP Short Term Goal 4 (Week 1): Pt will consume therapeutic trials of thin liquids and purees with max assist multimodal cues for swallow initiation and without respiratory decompensation over 3 consecutive sessions.  Skilled Therapeutic Interventions:   Skilled SLP intervention focused on swallowing and language. Daughter present during session. Trials completed with dys 1(puree) and  minced/moist consistency(dys 2), and thin liquids. Dysphagia 2 trials included pudding mixed with chopped graham cracker and chopped fruit cup.  Increase po acceptance with all trials this session. Pt took 1/2 tsp bites with pureed and opened mouth wider to accept po trials. Prolonged mastication with chopped fruit and pt eventually removed contents from mouth using tongue and orally expectorated into paper towel given by SLP. Extended mastication demonstrated with 1/2 tsp bites of pudding with graham cracker, however pt eventually swallowed all contents with trace oral residue on lingual surface. No overt s/sx of aspiration or penetration noted across textures. Recommend pt begin dys 1(puree texture diet) with thin liquids and meds crushed in pureed or via cortrak with full supervision. Swallow precaution sign posted with recommendations. RN notified of diet initiation. Increased head nods  and verbal yes/no responses to simple questions in context this session. Pt responded to 65% of questions with head nod or verbalizing yes/no. Pt completed automatic speech task counting 1-5 with mod-max A verbal cues. Pt stated name when given additional time. Pt perseverative on responding with her own name [name]) when asked the names of daughters. Pt completed single word repetition on 5/5 trials. SLP questions whether patients communication is impaired due to cognitive impairment vs language disorder. Will continue to assess.   Pt left seated upright in bed with bed alarm and daughter present at bedside.  Pain Pain Assessment Pain Scale: Faces Faces Pain Scale: No hurt  Therapy/Group: Individual Therapy  Darrol Poke Kasee Hantz 08/01/2020, 2:08 PM

## 2020-08-01 NOTE — Progress Notes (Signed)
Nutrition Follow-up  RD working remotely.  DOCUMENTATION CODES:   Not applicable  INTERVENTION:   Restart tube feeds via Cortrak: - Osmolite 1.2 @ 65 ml/hr x 20 hours (tube feeds can be held for up to 4 hours for therapies) - ProSource TF 45 ml daily. - Free water flushes of 150 ml q 4 hours  Tube feeding regimen provides 1600 kcal, 83 grams of protein, and 1066 ml of H2O.  Total free water: 1966 ml daily  NUTRITION DIAGNOSIS:   Inadequate oral intake related to inability to eat as evidenced by NPO status.  Ongoing, being addressed via TF  GOAL:   Patient will meet greater than or equal to 90% of their needs  Met via TF  MONITOR:   Diet advancement, Weight trends, Labs, TF tolerance, Skin, I & O's  REASON FOR ASSESSMENT:   Consult Enteral/tube feeding initiation and management  ASSESSMENT:   Lori Bridges is an 86 y.o. female with history of atrial fibrillation on coumadin, hypertension, hyperlipidemia, hypothyroidism and chronic kidney disease. Admitted with Lori Bridges.  Pt admitted to CIR with decreased functional mobility with altered mental status secondary to small left frontal medial ICH and left temporal occipital ICH in the setting of chronic Coumadin/likely hypertensive.   7/29 - Cortrak tube removed 7/30 - NGT placed at bedside, placement confirmed by KUB, later replaced with Cortrak 8/04 - pt pulled Cortrak, Cortrak replaced (tip gastric per x-ray)  Pt remains NPO. Unable to reach pt via phone call to room.  Weight trending up since admission. Per RN edema assessment, pt with mild pitting edema to RUE.  Admit weight: 79.2 kg Current weight: 84.4 kg  Will continue with current TF regimen and monitor for diet advancement. Pt continues to work with SLP.  Current TF orders: Osmolite 1.2 @ 65 ml/hr x 20 hours, ProSource TF 45 ml daily, free water flushes 150 ml q 4 hours  Medications reviewed and include: protonix, senna  Labs reviewed: sodium  146  NUTRITION - FOCUSED PHYSICAL EXAM:  Unable to complete at this time. RD working remotely.  Diet Order:   Diet Order            Diet NPO time specified  Diet effective now                 EDUCATION NEEDS:   No education needs have been identified at this time  Skin:  Skin Assessment: Skin Integrity Issues: Other: skin tear to left thigh, open wound to perineum  Last BM:  08/01/20 large type 7  Height:   Ht Readings from Last 1 Encounters:  07/26/20 _0  (1.676 m)    Weight:   Wt Readings from Last 1 Encounters:  08/01/20 84.4 kg    Ideal Body Weight:  59.1 kg  BMI:  Body mass index is 30.03 kg/m.  Estimated Nutritional Needs:   Kcal:  1550-1750  Protein:  80-95 grams  Fluid:  > 1.5 L    Lori Face, MS, RD, LDN Inpatient Clinical Dietitian Please see [facility] for contact information.

## 2020-08-01 NOTE — Patient Care Conference (Signed)
Inpatient RehabilitationTeam Conference and Plan of Care Update Date: 08/01/2020   Time: 11:25 AM    Patient Name: Lori Bridges      Medical Record Number: [id number]  Date of Birth: Jun 05, 1930 Sex: Female         Room/Bed: 4W06C/4W06C-01 Payor Info: Payor: MEDICARE / Plan: MEDICARE PART A AND B / Product Type: *No Product type* /    Admit Date/Time:  07/26/2020  4:25 PM  Primary Diagnosis:  ICH (intracerebral hemorrhage) [facility])  Hospital Problems: Principal Problem:   ICH (intracerebral hemorrhage) ([place]) - 2 small L brain ICH, likely d/t HTN on subtherapeutic warfarin Active Problems:   Benign essential HTN   Dysphagia, post-stroke   PAF (paroxysmal atrial fibrillation) (HCC)   Seizure prophylaxis   Gastroesophageal reflux disease   Atrial fibrillation (HCC)   Thrombocytopenia ([place])   Hypernatremia    Expected Discharge Date: Expected Discharge Date: 08/22/20  Team Members Present: Physician leading conference: Dr. Delice Lesch Care Coodinator Present: Dorien Chihuahua, RN, BSN, CRRN;Christina [place], [place] Nurse Present: Other (comment) Suezanne Cheshire, RN) PT Present: Other (comment) (Christain Manhardt, PT) OT Present: Meriel Pica, OT SLP Present: Jettie Booze, CF-SLP PPS Coordinator present : Ileana Ladd, Burna Mortimer, SLP     Current Status/Progress Goal Weekly Team Focus  Bowel/Bladder   incontinent b/b; LBM: 08/04  incontinent care to reduce MASD  assist with toileting needs prn   Swallow/Nutrition/ Hydration   NPO but progressing well with thin and puree trials  Mod  initiate PO diet, education   ADL's   total assist +2 bed mobility and sit<>stand transfers; total assist +2 for LB ADLs; total assist +1 UB ADLs  mod to max assist LB ADLs, supervision to min assist for UB ADLs, min to mod assist functional transfers  bed mobility, right attention and tracking, ROM RUE, hand over hand self care, standing frame   Mobility   totalA+2 bed mobility, maxA +1 sitting  EOB, maxA +2 sit<>stand transfers  modA bed mobility, transfers, w/c mobility  sitting balance, general strengthening, transfers   Communication   minimally verbal, suspect mostly due to cognition but still assessing         Safety/Cognition/ Behavioral Observations  Max problem solving and initiation, Mod attention  Mod  basic familiar problem solving, task initiaiton, attention to functional tasks   Pain   no c/o pain  remain pain free  assess pain level QS and prn   Skin   MASD to perineum, barium cream  remain free of new skin breakdown/infection  assess skin QS and prn     Discharge Planning:   Home with daughter and other family members to assist with care  Team Discussion: Right neglect, tone in upper and lower extremity, severe postural pushing, no midline tracking, limited following of commands limiting function.  Patient on target to meet rehab goals: no, goals set for Moderate assistance; anticipate will be w/c level at discharge  *See Care Plan and progress notes for long and short-term goals.   Revisions to Treatment Plan:  Use yes/no questions Cues for swallowing SLP assessment for diet upgrade/oral medications Teaching Needs: Medications, transfers, toileting, B+D, etc.  Current Barriers to Discharge: Home enviroment access/layout and Incontinence, Nutrition/Medications,   Possible Resolutions to Barriers: Recommend ramp installation for discharge SLP working on cues for swallowing with D1 thin liquid diet     Medical Summary Current Status: Left spastic hemiparesis, global aphasia secondary to small left frontal medial ICH and left temporal occipital ICH  in the setting of chronic Coumadin/likely hypertensive  Barriers to Discharge: Medical stability;Behavior;Nutrition means;Incontinence   Possible Resolutions to [employer] Focus: Therapies, follow labs, optimize BP meds, advance diet as tolerated   Continued Need for Acute Rehabilitation Level of Care:  The patient requires daily medical management by a physician with specialized training in physical medicine and rehabilitation for the following reasons: Direction of a multidisciplinary physical rehabilitation program to maximize functional independence : Yes Medical management of patient stability for increased activity during participation in an intensive rehabilitation regime.: Yes Analysis of laboratory values and/or radiology reports with any subsequent need for medication adjustment and/or medical intervention. : Yes   I attest that I was present, lead the team conference, and concur with the assessment and plan of the team.   Dorien Chihuahua B 08/01/2020, 2:11 PM

## 2020-08-01 NOTE — Progress Notes (Addendum)
Lori Bridges PHYSICAL MEDICINE & REHABILITATION PROGRESS NOTE  Subjective/Complaints: Patient seen laying in bed this morning.  Overnight, she pulled out a core track.  Daughter at bedside.  She has questions regarding feeding and discharge plans.  ROS: Limited due to cognition.  Objective: Vital Signs: Blood pressure 112/76, pulse 82, temperature (!) 97.4 F (36.3 C), resp. rate 18, height 5\' 6"  (1.676 m), weight 84.4 kg, SpO2 97 %. No results found. No results for input(s): WBC, HGB, HCT, PLT in the last 72 hours. No results for input(s): NA, K, CL, CO2, GLUCOSE, BUN, CREATININE, CALCIUM in the last 72 hours.  Physical Exam: BP 112/76 (BP Location: Right Arm)   Pulse 82   Temp (!) 97.4 F (36.3 C)   Resp 18   Ht 5\' 6"  (1.676 m)   Wt 84.4 kg   SpO2 97%   BMI 30.03 kg/m  Constitutional: No distress . Vital signs reviewed. HENT: Normocephalic.  Atraumatic.  + NG pulled out. Eyes: Limited right gaze. No discharge. Cardiovascular: No JVD.  Irregularly irregular. Respiratory: Normal effort.  No stridor.  Bilateral clear to auscultation. GI: Non-distended.  BS +. Skin: Warm and dry.  Intact. Psych: Unable to assess due to cognition Musc: Right-sided edema Neuro: Alert Global aphasia Motor: Moving left side spontaneously Emerging right lower extremity tone  Assessment/Plan: 1. 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  Physiatrist is providing close team supervision and 24 hour management of active medical problems listed below.  Physiatrist and rehab team continue to assess barriers to discharge/monitor patient progress toward functional and medical goals  Care Tool:  Bathing  Bathing activity did not occur: Safety/medical concerns (did not occur on eval, as pt was  not able to be aroused awake) Body parts bathed by patient:  (did not occur on eval, as pt was not able to be aroused awak)   Body parts bathed by helper: Right arm, Left arm, Chest, Abdomen, Face Body parts n/a: Front perineal area, Buttocks, Right upper leg, Left upper leg, Right lower leg, Left lower leg   Bathing assist Assist Level: Dependent - Patient 0%     Upper Body Dressing/Undressing Upper body dressing Upper body dressing/undressing activity did not occur (including orthotics):  (did not occur on eval, as pt was not able to be aroused awake) What is the patient wearing?: Pull over shirt    Upper body assist Assist Level: Dependent - Patient 0%    Lower Body Dressing/Undressing Lower body dressing      What is the patient wearing?: Incontinence brief     Lower body assist Assist for lower body dressing: Total Assistance - Patient < 25%     Toileting Toileting    Toileting assist Assist for toileting: 2 Helpers     Transfers Chair/bed transfer  Transfers assist  Chair/bed transfer activity did not occur: Safety/medical concerns  Chair/bed transfer assist level: Dependent - mechanical lift     Locomotion Ambulation   Ambulation assist   Ambulation activity did not occur: Safety/medical concerns          Walk 10 feet activity   Assist  Walk 10 feet activity did not occur: Safety/medical concerns        Walk 50 feet activity   Assist Walk 50 feet with 2 turns activity did not occur: Safety/medical concerns  Walk 150 feet activity   Assist Walk 150 feet activity did not occur: Safety/medical concerns         Walk 10 feet on uneven surface  activity   Assist Walk 10 feet on uneven surfaces activity did not occur: Safety/medical concerns         Wheelchair     Assist Will patient use wheelchair at discharge?: Yes Type of Wheelchair: Manual Wheelchair activity did not occur: Safety/medical concerns          Wheelchair 50 feet with 2 turns activity    Assist    Wheelchair 50 feet with 2 turns activity did not occur: Safety/medical concerns       Wheelchair 150 feet activity     Assist Wheelchair 150 feet activity did not occur: Safety/medical concerns          Medical Problem List and Plan: 1.    Left spastic hemiparesis, global aphasia secondary to small left frontal medial ICH and left temporal occipital ICH in the setting of chronic Coumadin/likely hypertensive  Continue CIR  WHO/PRAFO ordered  Team conference today to discuss current and goals and coordination of care, home and environmental barriers, and discharge planning with nursing, case manager, and therapies. Please see conference note from today as well.  2.  Antithrombotics: -DVT/anticoagulation: Subcutaneous heparin for DVT prophylaxis initiated July 25, 2020             -antiplatelet therapy: N/A 3. Pain Management: Tylenol as needed  Appears controlled on 8/3 4. Mood: D/c Ativan due to lethargy.              -antipsychotic agents: N/A 5. Neuropsych: This patient is not capable of making decisions on her own behalf.  Continue wrist restraints for safety 6. Skin/Wound Care: Routine skin checks 7. Fluids/Electrolytes/Nutrition: Routine in and outs. 8.  Seizure prophylaxis.    Keppra 1000 mg twice daily.  EEG negative 9.  Atrial fibrillation.  Chronic Coumadin reversed.  Cardiac rate controlled. Continue Lopressor 75 mg twice daily  Rate controlled on 8/3 10. Post stroke dysphagia.  Currently n.p.o. with nasogastric tube feeds.  Dietary follow-up  Advance diet as tolerated 11.  Hypertension.  Lasix 20 mg daily, Avapro 150 mg daily, Toprol XL 100 mg daily currently on hold.    Continue hydralazine 25 mg twice daily,   Continue Lopressor 75 BID  Resume as needed  Relatively controlled on 8/4 12.  Hypothyroidism.  Synthroid 13.  Hyperlipidemia.  Statin on hold in the setting of ICH 14.  UTI/E. coli.   Completed course of Rocephin 15.  GERD.  Continue Protonix  16.  Hypernatremia  Sodium 146 on 7/30  17.  Thrombocytopenia  Platelets 133 on 7/30  Continue to monitor  LOS: 6 days A FACE TO FACE EVALUATION WAS PERFORMED  Lori Bridges Lori Bridges 08/01/2020, 9:27 AM

## 2020-08-01 NOTE — Progress Notes (Signed)
Occupational Therapy Session Note  Patient Details  Name: Lori Bridges MRN: [id number] Date of Birth: January 09, 1930  Today's Date: 08/01/2020 OT Individual Time: 1010-1105 OT Individual Time Calculation (min): 55 min    Short Term Goals: Week 1:  OT Short Term Goal 1 (Week 1): Pt will demonstrate alertness to engage in UB bathing with max A. OT Short Term Goal 2 (Week 1): Pt will follow 1 step commands with 50 % accuracy with donning UB clothing with max A. OT Short Term Goal 3 (Week 1): Pt will sit to EOB with max a. OT Short Term Goal 4 (Week 1): Pt will maintain static sitting for 5 min at EOB with mod A to prepare her for sitting on a BSC.  Skilled Therapeutic Interventions/Progress Updates:    Pt sitting up in TIS w/c, no c/o or appearance of pain.  Pt presenting with improved arousal and responsiveness today.  Pt positioned in front of sink to complete UB bathing and dressing.  Pt required total assist to doff button down shirt, OT providing hand over hand LUE to facilitate increased participation.  Pt instructed to turn on water and pt able to complete after visual demo provided with repetitive simple VCs and increased time to process.  Pt squeezed washcloth with similar assist as turning water on.  Pt slightly resistive to participating in washing task initially, however after hand over hand provided pt able to participate in passive motion to bath UB with total assist.  Pt did follow one step command to thread LUE in shirt sleeve, max assist to donn rest of the way.    Pt transported to gym for table top right visual scanning and functional grasping task to transfer large red tokens from left side of table to just past midline on right side.  Pt needing repetitive multimodal cueing, and increased time in order to push one token across to midline.  Pt then rubbing stomach and grasping periarea, when asked if she needed to use bathroom, pt reported "yes".  Pt transported back to room for  toileting care.  Pt completed sit <>stand with +2 at stedy and transported from TIS w/c to EOB.  Sit to supine with total assist +2.  Pt needing total assist +2 for left and right rolling and pericare, clothing mgt.  Pt had incontinent episode of small amount of bowel and bladder.  Pt did grasp bed rail today, demonstrating increased participation during bed mobility.  Call bell in reach, wrist restraint donned, bed alarm on.  Therapy Documentation Precautions:  Precautions Precautions: Fall Restrictions Weight Bearing Restrictions: No Other Position/Activity Restrictions: NPO with NGT. B soft rest restraints.   Therapy/Group: Individual Therapy  Ezekiel Slocumb 08/01/2020, 2:00 PM

## 2020-08-01 NOTE — Progress Notes (Signed)
Physical Therapy Session Note  Patient Details  Name: Lori Bridges MRN: [id number] Date of Birth: 11-Nov-1930  Today's Date: 08/01/2020 PT Individual Time: 0900-1000 PT Individual Time Calculation (min): 60 min   Short Term Goals: Week 1:  PT Short Term Goal 1 (Week 1): Pt will complete bed mobility with maxA +1 in order to improve functional independence. PT Short Term Goal 2 (Week 1): Pt will tolerate sitting in TIS w/c for >2 hrs in order to strengthen activity tolerance PT Short Term Goal 3 (Week 1): Pt will safely perform Stedy transfer with maxA +1 from EOB <> w/c. PT Short Term Goal 4 (Week 1): Pt will maintain sitting EOB for >5 minutes with modA +1 without BUE support.  Skilled Therapeutic Interventions/Progress Updates:    Pt received supine in bed, daughter Lori Bridges at bedside, pt appears agreeable to PT session. Pt with eyes open throughout session, mostly nonverbal other than a few "what" when asked questions and states "here" when asked where she is. Pt answers questions better with yes/no vs open ended. Pt required totalA +2 for lower body dressing including pajama pants, TED hose, and shoes while supine in bed. TotalA+2 for upper body dressing while seated EOB. No initiation from pt when therapist provided cues for bed mobility. Required totalA +2 for all bed mobility including rolling and supine<>sit. Once EOB, pt required maxA +1 without BUE support, able to sit with CGA with LUE to footboard where she was able to sustain grasp. Performed x5 sit<>stand transfers from [facility] with maxA +2. Therapist providing cues for upright posture and improving forward gaze. Focused on standing tolerance and midline orientation as pt push's towards hemiparetic side with L gaze preference. Utilized mirror in room for neuro-re-ed to encourage midline orientation which appeared to assist. Educated daughter on w/c features such as tilting and brakes. Encouraged daughter, if she felt comfortable, to  perform tilting every ~30 minutes for pressure relief while she was sitting in chair. Pt ended session seated in TIS w/c, BUE arms supported on lap trap, needs in reach, L soft wrist restraint donned, daughter at bedside.  Therapy Documentation Precautions:  Precautions Precautions: Fall Restrictions Weight Bearing Restrictions: No Other Position/Activity Restrictions: NPO with NGT. B soft rest restraints.  Therapy/Group: Individual Therapy  Givanni Staron P Josceline Chenard PT 08/01/2020, 10:34 AM

## 2020-08-01 NOTE — Procedures (Signed)
Cortrak  Tube Type:  Cortrak - 43 inches Tube Location:  Left nare Initial Placement:  Stomach Secured by: Bridle Technique Used to Measure Tube Placement:  Documented cm marking at nare/ corner of mouth Cortrak Secured At:  60 cm    Cortrak Tube Team Note:  Consult received to place a Cortrak feeding tube.   X-ray is required, abdominal x-ray has been ordered by the Cortrak team. Please confirm tube placement before using the Cortrak tube.   If the tube becomes dislodged please keep the tube and contact the Cortrak team at [email or web address] (password [email or web address]) for replacement.  If after hours and replacement cannot be delayed, place a NG tube and confirm placement with an abdominal x-ray.    Ryett Hamman MS, RD, LDN Please refer to AMION for RD and/or RD on-call/weekend/after hours pager   

## 2020-08-02 ENCOUNTER — Inpatient Hospital Stay (HOSPITAL_COMMUNITY): Payer: Medicare Other

## 2020-08-02 ENCOUNTER — Inpatient Hospital Stay (HOSPITAL_COMMUNITY): Payer: Medicare Other | Admitting: Speech Pathology

## 2020-08-02 ENCOUNTER — Inpatient Hospital Stay (HOSPITAL_COMMUNITY): Payer: Medicare Other | Admitting: Occupational Therapy

## 2020-08-02 DIAGNOSIS — D72819 Decreased white blood cell count, unspecified: Secondary | ICD-10-CM

## 2020-08-02 MED ORDER — ENSURE ENLIVE PO LIQD
237.0000 mL | Freq: Two times a day (BID) | ORAL | Status: DC
  Administered 2020-08-02 – 2020-08-14 (×20): 237 mL via ORAL

## 2020-08-02 MED ORDER — OSMOLITE 1.2 CAL PO LIQD
960.0000 mL | ORAL | Status: DC
  Administered 2020-08-02 – 2020-08-03 (×2): 960 mL

## 2020-08-02 MED ORDER — FREE WATER
200.0000 mL | Freq: Four times a day (QID) | Status: DC
  Administered 2020-08-02 – 2020-08-06 (×16): 200 mL

## 2020-08-02 NOTE — Progress Notes (Signed)
Speech Language Pathology Daily Session Note  Patient Details  Name: Lori Bridges MRN: [id number] Date of Birth: Aug 04, 1930  Today's Date: 08/02/2020 SLP Individual Time: 1115-1200 SLP Individual Time Calculation (min): 45 min  Short Term Goals: Week 1: SLP Short Term Goal 1 (Week 1): Pt will remain awake (eyes opened) for 5-7 minute intervals with max assist multimodal cues. SLP Short Term Goal 2 (Week 1): Pt will sustain her attention to basic, familiar tasks for 1 minute intervals with max assist multimodal cues. SLP Short Term Goal 3 (Week 1): Pt will complete basic, familiar tasks with max assist for functional problem solving. SLP Short Term Goal 4 (Week 1): Pt will consume therapeutic trials of thin liquids and purees with max assist multimodal cues for swallow initiation and without respiratory decompensation over 3 consecutive sessions.  Skilled Therapeutic Interventions:   Skilled SLP intervention focused on expressive language and dysphagia. Pt was sleeping when SLP arrived but participated well throughout entire session in therapy office. Trials completed with dysphagia 2 snack. Pt is total A with feeding. Demonstrated extended mastication but oral clearance was adequate. No overt s/sx of aspiration or penetration noted during session. Pt named 10/10 everyday objects with Min A verbal cues. Pt stated object function for everyday objects with Moderate assistance verbal cues and increased time required.  Pt perseverated on previously stated items when naming objects but was able to correct with verbal cues. Pt returned to room with call bell within reach and family present. Recommend continue current diet for now but prognosis is good for advancement in the near future. Cont with therapy per plan of care.   Pain Pain Assessment Pain Scale: Faces Faces Pain Scale: No hurt  Therapy/Group: Individual Therapy  Darrol Poke Rithy Mandley 08/02/2020, 3:56 PM

## 2020-08-02 NOTE — Plan of Care (Signed)
  Problem: RH Balance Goal: LTG Patient will maintain dynamic standing balance (PT) Description: LTG:  Patient will maintain dynamic standing balance with assistance during mobility activities (PT) Flowsheets (Taken 08/02/2020 1218) LTG: Pt will maintain dynamic standing balance during mobility activities with:: (Goal added 2/2 improved functional mobility and alertness) Contact Guard/Touching assist   Problem: Sit to Stand Goal: LTG:  Patient will perform sit to stand with assistance level (PT) Description: LTG:  Patient will perform sit to stand with assistance level (PT) Flowsheets (Taken 08/02/2020 1218) LTG: PT will perform sit to stand in preparation for functional mobility with assistance level: (Goal added 2/2 improved functional mobility and alertness) Minimal Assistance - Patient > 75%   Problem: RH Bed Mobility Goal: LTG Patient will perform bed mobility with assist (PT) Description: LTG: Patient will perform bed mobility with assistance, with/without cues (PT). Flowsheets (Taken 08/02/2020 1218) LTG: Pt will perform bed mobility with assistance level of: (Goal added 2/2 improved functional mobility and alertness) Minimal Assistance - Patient > 75%   Problem: RH Bed to Chair Transfers Goal: LTG Patient will perform bed/chair transfers w/assist (PT) Description: LTG: Patient will perform bed to chair transfers with assistance (PT). Flowsheets (Taken 08/02/2020 1218) LTG: Pt will perform Bed to Chair Transfers with assistance level: (Goal added 2/2 improved functional mobility and alertness) Minimal Assistance - Patient > 75%   Problem: RH Ambulation Goal: LTG Patient will ambulate in controlled environment (PT) Description: LTG: Patient will ambulate in a controlled environment, # of feet with assistance (PT). 08/02/2020 1218 by Amelie Caracci P, PT Flowsheets (Taken 08/02/2020 1218) LTG: Pt will ambulate in controlled environ  assist needed:: (Goal added 2/2 improved functional  mobility and alertness) Minimal Assistance - Patient > 75% LTG: Ambulation distance in controlled environment: 75ft 08/02/2020 1212 by Sherilyn Dacosta, Maribelle Hopple P, PT Flowsheets (Taken 08/02/2020 1212) LTG: Pt will ambulate in controlled environ  assist needed:: (Goal added 2/2 improved function) Minimal Assistance - Patient > 75%

## 2020-08-02 NOTE — Progress Notes (Signed)
Occupational Therapy Session Note  Patient Details  Name: Lori Bridges MRN: [id number] Date of Birth: [birth year]  Today's Date: 08/02/2020 OT Individual Time: 7356-7014 OT Individual Time Calculation (min): 41 min    Short Term Goals: Week 1:  OT Short Term Goal 1 (Week 1): Pt will demonstrate alertness to engage in UB bathing with max A. OT Short Term Goal 2 (Week 1): Pt will follow 1 step commands with 50 % accuracy with donning UB clothing with max A. OT Short Term Goal 3 (Week 1): Pt will sit to EOB with max a. OT Short Term Goal 4 (Week 1): Pt will maintain static sitting for 5 min at EOB with mod A to prepare her for sitting on a BSC.  Skilled Therapeutic Interventions/Progress Updates:    Pts dtr reporting pt has been sitting up for a while and is probably tired.  When OT asked pt if she would like to return to bed, pt reported "yes".  Pt observed tracking to right side past midline upon OT arrival.  Pt completed sliding board transfer w/c to EOB with max assist +2.  Pt sat EOB x 3 minutes with CGA and able to initiate doffing shoes with kick method LLE however no active movement of RLE to assist therefore needing max assist to doff shoes.  Sit to supine and repositioned towards [facility] with total assist +2.  Pt participated in right visual tracking and attending using familiar ADL items including church pamphlet, familiar photo, and personal Bible.  Pt able to track significantly to right side and read short sentences aloud, however noted pt reading beginning first few words of sentence then starting over, indicating possible right sided visual field cut.  Pt intermittently able to read full sentence with max simple VCs and TCs.  Pt cue'd to reach for pictures with right hand and needing hand over hand to initiate.  Pt did reach with TCs and opened hand and pinched picture one time, however inconsistent with other attempts.  Call bell in reach, bed alarm on, bilateral wrist restraints donned.   Pt demonstrated significant improvement with right sided attention, following one step commands, and right eye gaze.    Therapy Documentation Precautions:  Precautions Precautions: Fall Restrictions Weight Bearing Restrictions: No Other Position/Activity Restrictions: NPO with NGT. B soft rest restraints.     Therapy/Group: Individual Therapy  Ezekiel Slocumb 08/02/2020, 1:01 PM

## 2020-08-02 NOTE — Progress Notes (Signed)
Nutrition Follow-up  DOCUMENTATION CODES:   Not applicable  INTERVENTION:   Transition to nocturnal tube feeds via Cortrak: - Osmolite 1.2 @ 80 ml/hr x 12 hours from 1800 to 0600 (total of 960 ml)  - Continue ProSource TF 45 ml daily - Free water flushes per MD/PA, currently 200 ml q 6 hours  Tube feeding regimen provides 1192 kcal, 64 grams of protein, and 787 ml of H2O (77% of kcal needs, 80% of protein needs).  Total free water with flushes: 1587 ml daily  - Ensure Enlive po BID, each supplement provides 350 kcal and 20 grams of protein  NUTRITION DIAGNOSIS:   Inadequate oral intake related to inability to eat as evidenced by NPO status.  Progressing, pt now on Dysphagia 1 diet  GOAL:   Patient will meet greater than or equal to 90% of their needs  Met via TF  MONITOR:   PO intake, Supplement acceptance, Labs, Weight trends, TF tolerance, Diet advancement, Skin  REASON FOR ASSESSMENT:   Consult Enteral/tube feeding initiation and management  ASSESSMENT:   SALIAH CRISP is an 86 y.o. female with history of atrial fibrillation on coumadin, hypertension, hyperlipidemia, hypothyroidism and chronic kidney disease. Admitted with [place].  Pt admitted to CIR with decreased functional mobility with altered mental status secondary to small left frontal medial ICH and left temporal occipital ICH in the setting of chronic Coumadin/likely hypertensive.   7/29 - Cortrak tube removed 7/30 - NGT placed at bedside, placement confirmed by KUB, later replaced with Cortrak 8/04 - pt pulled Cortrak, Cortrak replaced (tip gastric per x-ray), diet advanced to dysphagia 1 with thin liquids  Noted target d/c date of 8/25.  Spoke with pt and family member at bedside. Pt's family member feeding her Magic Cup and other items from lunch meal tray at time of RD visit. Pt had completed ~50% of lunch meal. RD will transition pt to nocturnal TF to promote appetite and PO intake during the  day.  RD will order Ensure Enlive oral nutrition supplements to aid pt in meeting kcal and protein needs via PO route during admission.  Current TF orders: Osmolite 1.2 @ 65 ml/hr x 20 hours, ProSource TF 45 ml daily, free water flushes 200 ml q 6 hours  Meal Completion: 50% x 2 meals  Medications reviewed and include: protonix, senna  Labs reviewed: sodium 146 on 7/30  Diet Order:   Diet Order            DIET - DYS 1 Room service appropriate? Yes; Fluid consistency: Thin  Diet effective now                 EDUCATION NEEDS:   No education needs have been identified at this time  Skin:  Skin Assessment: Skin Integrity Issues: Other: skin tear to left thigh, open wound to perineum  Last BM:  08/01/20 large type 7  Height:   Ht Readings from Last 1 Encounters:  07/26/20 '5\' 6"'  (1.676 m)    Weight:   Wt Readings from Last 1 Encounters:  08/02/20 84.4 kg    Ideal Body Weight:  59.1 kg  BMI:  Body mass index is 30.03 kg/m.  Estimated Nutritional Needs:   Kcal:  1550-1750  Protein:  80-95 grams  Fluid:  > 1.5 L    Gaynell Face, MS, RD, LDN Inpatient Clinical Dietitian Please see AMi[facility] for contact information.

## 2020-08-02 NOTE — Progress Notes (Signed)
Physical Therapy Session Note  Patient Details  Name: Lori Bridges MRN: [id number] Date of Birth: 03/31/1930  Today's Date: 08/02/2020 PT Individual Time: 0900-1000 PT Individual Time Calculation (min): 60 min   Short Term Goals: Week 1:  PT Short Term Goal 1 (Week 1): Pt will complete bed mobility with maxA +1 in order to improve functional independence. PT Short Term Goal 2 (Week 1): Pt will tolerate sitting in TIS w/c for >2 hrs in order to strengthen activity tolerance PT Short Term Goal 3 (Week 1): Pt will safely perform Stedy transfer with maxA +1 from EOB <> w/c. PT Short Term Goal 4 (Week 1): Pt will maintain sitting EOB for >5 minutes with modA +1 without BUE support.  Skilled Therapeutic Interventions/Progress Updates:    Pt received supine in bed, NA present for pericare and linen change as pt was soiled with BM. Daughter, Lori Bridges, at bedside. PT assisted with emphasis on bed mobility. Pt performed rolling L<>R with maxA +1, delayed initiation for LUE reaching/grasp. Pt able to maintain sidelying on L with SBA while maintaining LUE grasp to bedrail. Pt required totalA +2 for pericare and lower/upper body dressing for time management. Pt more verbal today and appears to be able to attend to midline more frequently when cued although L gaze preference strongly present. Pt required maxA +1 for supine<>sit via log rolling and initially required minA for seated EOB however progressed quickly to CGA without BUE support. Pt performed squat-pivot transfer with maxA +2 form EOB to TIS and was transported to therapy gym for time management. Pt performed x1 sit<>stand transfer with modA +1 while facing // bars with mirror for visual feedback for midline orientation. While standing, pt was able to maintain BUE grasp to // bars and also able to stand for >2 minutes with mostly minA and R knee block for safety, cues provided for upright posture and visual feedback from mirror. Pt required modA +1 for  stand<>sit transfer and was placed with TIS in // bars. Pt was able to ambulate the length of // bars x2 bouts with modA +1 and close w/c follow for safety. She required therapist facilitation for BUE advancement on bars as well as BLE advancement to initiate swing phase. Pt transported back to room and left in TIS w/c with wrist restraint donned and lap tray in place, daughter at bedside. When asked patient to tell daughter what she accomplished with therapy today, she stated "walked." Pt ended session with all needs met.   Therapy Documentation Precautions:  Precautions Precautions: Fall Restrictions Weight Bearing Restrictions: No Other Position/Activity Restrictions: NPO with NGT. B soft rest restraints. Vital Signs: Therapy Vitals Temp: 98.2 F (36.8 C) Pulse Rate: 92 Resp: 16 BP: 117/90 Patient Position (if appropriate): Lying Oxygen Therapy SpO2: 97 % O2 Device: Room Air  Therapy/Group: Individual Therapy  Mihran Lebarron P Elva Breaker PT 08/02/2020, 7:52 AM

## 2020-08-02 NOTE — Progress Notes (Signed)
[place] PHYSICAL MEDICINE & REHABILITATION PROGRESS NOTE  Subjective/Complaints: Patient seen laying in bed this AM.  No family at bedside.  No reported issues.   ROS: Limited due to cognition.   Objective: Vital Signs: Blood pressure 117/90, pulse 92, temperature 98.2 F (36.8 C), resp. rate 16, height 5\' 6"  (1.676 m), weight 84.4 kg, SpO2 97 %. DG Abd Portable 1V  Result Date: 08/01/2020 CLINICAL DATA:  Feeding tube placement EXAM: PORTABLE ABDOMEN - 1 VIEW COMPARISON:  July 27, 2020 FINDINGS: Nasogastric tube has been removed. Feeding tube tip is in the region of the distal stomach. There is no bowel dilatation or air-fluid level to suggest bowel obstruction. No free air. There is a focal calcification in the right mid abdomen measuring 5 x 5 mm. IMPRESSION: Feeding tube tip in distal stomach. No bowel obstruction or free air. 5 mm calcification in right pelvis, potentially within the right kidney. Electronically Signed   By: Lowella Grip III M.D.   On: 08/01/2020 12:44   No results for input(s): WBC, HGB, HCT, PLT in the last 72 hours. No results for input(s): NA, K, CL, CO2, GLUCOSE, BUN, CREATININE, CALCIUM in the last 72 hours.  Physical Exam: BP 117/90 (BP Location: Left Arm)   Pulse 92   Temp 98.2 F (36.8 C)   Resp 16   Ht 5\' 6"  (1.676 m)   Wt 84.4 kg   SpO2 97%   BMI 30.03 kg/m  Constitutional: No distress . Vital signs reviewed. HENT: Normocephalic.  Atraumatic.  + NG. Eyes: Limited right gaze. No discharge. Cardiovascular: No JVD.  Irregularly irregular.  Respiratory: Normal effort.  No stridor.  Bilaterally clear to auscultation. GI: Non-distended.  BS +. Skin: Warm and dry.  Intact. Psych: Unable to assess due to cognition Musc: Right-sided edema.  No tenderness. Neuro: Alert Global aphasia Motor: Moving left side spontaneously Right-sided tone  Assessment/Plan: 1. Functional deficits secondary to small left frontal medial ICH and left temporal  occipital ICH in the setting of chronic Coumadin/likely hypertensive which require 3+ hours per day of interdisciplinary therapy in a comprehensive inpatient rehab setting.  Physiatrist is providing close team supervision and 24 hour management of active medical problems listed below.  Physiatrist and rehab team continue to assess barriers to discharge/monitor patient progress toward functional and medical goals  Care Tool:  Bathing  Bathing activity did not occur: Safety/medical concerns (did not occur on eval, as pt was not able to be aroused awake) Body parts bathed by patient:  (did not occur on eval, as pt was not able to be aroused awak)   Body parts bathed by helper: Right arm, Left arm, Chest, Abdomen, Face Body parts n/a: Front perineal area, Buttocks, Right upper leg, Left upper leg, Right lower leg, Left lower leg   Bathing assist Assist Level: Dependent - Patient 0%     Upper Body Dressing/Undressing Upper body dressing Upper body dressing/undressing activity did not occur (including orthotics):  (did not occur on eval, as pt was not able to be aroused awake) What is the patient wearing?: Button up shirt    Upper body assist Assist Level: Total Assistance - Patient < 25%    Lower Body Dressing/Undressing Lower body dressing      What is the patient wearing?: Incontinence brief, Pants     Lower body assist Assist for lower body dressing: Dependent - Patient 0%     Toileting Toileting    Toileting assist Assist for toileting: 2 Helpers  Transfers Chair/bed transfer  Transfers assist  Chair/bed transfer activity did not occur: Safety/medical concerns  Chair/bed transfer assist level: 2 Helpers     Locomotion Ambulation   Ambulation assist   Ambulation activity did not occur: Safety/medical concerns          Walk 10 feet activity   Assist  Walk 10 feet activity did not occur: Safety/medical concerns        Walk 50 feet  activity   Assist Walk 50 feet with 2 turns activity did not occur: Safety/medical concerns         Walk 150 feet activity   Assist Walk 150 feet activity did not occur: Safety/medical concerns         Walk 10 feet on uneven surface  activity   Assist Walk 10 feet on uneven surfaces activity did not occur: Safety/medical concerns         Wheelchair     Assist Will patient use wheelchair at discharge?: Yes Type of Wheelchair: Manual Wheelchair activity did not occur: Safety/medical concerns         Wheelchair 50 feet with 2 turns activity    Assist    Wheelchair 50 feet with 2 turns activity did not occur: Safety/medical concerns       Wheelchair 150 feet activity     Assist Wheelchair 150 feet activity did not occur: Safety/medical concerns          Medical Problem List and Plan: 1.    Left spastic hemiparesis, global aphasia secondary to small left frontal medial ICH and left temporal occipital ICH in the setting of chronic Coumadin/likely hypertensive  Continue CIR  WHO/PRAFO nightly 2.  Antithrombotics: -DVT/anticoagulation: Subcutaneous heparin for DVT prophylaxis initiated July 25, 2020             -antiplatelet therapy: N/A 3. Pain Management: Tylenol as needed  Appears controlled on 8/5 4. Mood: D/c Ativan due to lethargy.              -antipsychotic agents: N/A 5. Neuropsych: This patient is not capable of making decisions on her own behalf.  Continue wrist restraints for safety 6. Skin/Wound Care: Routine skin checks 7. Fluids/Electrolytes/Nutrition: Routine in and outs. 8.  Seizure prophylaxis.    Keppra 1000 mg twice daily.  EEG negative 9.  Atrial fibrillation.  Chronic Coumadin reversed.  Cardiac rate controlled. Continue Lopressor 75 mg twice daily  Rate controlled on 8/5 10. Post stroke dysphagia.  D1 thins with nasogastric tube feeds.  Dietary follow-up  Advance diet as tolerated 11.  Hypertension.  Lasix 20 mg  daily, Avapro 150 mg daily, Toprol XL 100 mg daily currently on hold.    Continue hydralazine 25 mg twice daily,   Continue Lopressor 75 BID  Resume as needed  Relatively controlled on 8/5 12.  Hypothyroidism.  Synthroid 13.  Hyperlipidemia.  Statin on hold in the setting of ICH 14.  UTI/E. coli.  Completed course of Rocephin 15.  GERD.  Continue Protonix  16.  Hypernatremia  Sodium 146 on 7/30, labs ordered for tomorrow 17.  Thrombocytopenia  Platelets 133 on 7/30, labs ordered for tomorrow  Continue to monitor 18.  Leukopenia  WBCs 3.9 on 7/30, labs ordered for tomorrow  LOS: 7 days A FACE TO FACE EVALUATION WAS PERFORMED  Uri Turnbough [name] 08/02/2020, 9:12 AM

## 2020-08-03 ENCOUNTER — Inpatient Hospital Stay (HOSPITAL_COMMUNITY): Payer: Medicare Other | Admitting: Speech Pathology

## 2020-08-03 ENCOUNTER — Inpatient Hospital Stay (HOSPITAL_COMMUNITY): Payer: Medicare Other

## 2020-08-03 ENCOUNTER — Inpatient Hospital Stay (HOSPITAL_COMMUNITY): Payer: Medicare Other | Admitting: Occupational Therapy

## 2020-08-03 DIAGNOSIS — E875 Hyperkalemia: Secondary | ICD-10-CM

## 2020-08-03 DIAGNOSIS — E871 Hypo-osmolality and hyponatremia: Secondary | ICD-10-CM

## 2020-08-03 LAB — CBC WITH DIFFERENTIAL/PLATELET
Abs Immature Granulocytes: 0.06 10*3/uL (ref 0.00–0.07)
Basophils Absolute: 0 10*3/uL (ref 0.0–0.1)
Basophils Relative: 0 %
Eosinophils Absolute: 0.1 10*3/uL (ref 0.0–0.5)
Eosinophils Relative: 1 %
HCT: 44 % (ref 36.0–46.0)
Hemoglobin: 13.8 g/dL (ref 12.0–15.0)
Immature Granulocytes: 1 %
Lymphocytes Relative: 8 %
Lymphs Abs: 0.6 10*3/uL — ABNORMAL LOW (ref 0.7–4.0)
MCH: 29.6 pg (ref 26.0–34.0)
MCHC: 31.4 g/dL (ref 30.0–36.0)
MCV: 94.2 fL (ref 80.0–100.0)
Monocytes Absolute: 1.3 10*3/uL — ABNORMAL HIGH (ref 0.1–1.0)
Monocytes Relative: 19 %
Neutro Abs: 4.7 10*3/uL (ref 1.7–7.7)
Neutrophils Relative %: 71 %
Platelets: 158 10*3/uL (ref 150–400)
RBC: 4.67 MIL/uL (ref 3.87–5.11)
RDW: 19.8 % — ABNORMAL HIGH (ref 11.5–15.5)
WBC: 6.7 10*3/uL (ref 4.0–10.5)
nRBC: 0.3 % — ABNORMAL HIGH (ref 0.0–0.2)

## 2020-08-03 LAB — BASIC METABOLIC PANEL
Anion gap: 11 (ref 5–15)
BUN: 39 mg/dL — ABNORMAL HIGH (ref 8–23)
CO2: 19 mmol/L — ABNORMAL LOW (ref 22–32)
Calcium: 10.3 mg/dL (ref 8.9–10.3)
Chloride: 103 mmol/L (ref 98–111)
Creatinine, Ser: 1.06 mg/dL — ABNORMAL HIGH (ref 0.44–1.00)
GFR calc Af Amer: 54 mL/min — ABNORMAL LOW (ref >60)
GFR calc non Af Amer: 47 mL/min — ABNORMAL LOW (ref >60)
Glucose, Bld: 98 mg/dL (ref 70–99)
Potassium: 5.8 mmol/L — ABNORMAL HIGH (ref 3.5–5.1)
Sodium: 133 mmol/L — ABNORMAL LOW (ref 135–145)

## 2020-08-03 MED ORDER — SODIUM ZIRCONIUM CYCLOSILICATE 5 G PO PACK
5.0000 g | PACK | Freq: Once | ORAL | Status: AC
  Administered 2020-08-03: 5 g via ORAL
  Filled 2020-08-03: qty 1

## 2020-08-03 MED ORDER — NON FORMULARY: 1.5000 mg | Freq: Every day | Status: DC

## 2020-08-03 MED ORDER — MELATONIN 3 MG PO TABS
1.5000 mg | ORAL_TABLET | Freq: Every day | ORAL | Status: DC
  Administered 2020-08-03 – 2020-08-15 (×12): 1.5 mg via ORAL
  Filled 2020-08-03 (×13): qty 1

## 2020-08-03 NOTE — Progress Notes (Signed)
Physical Therapy Weekly Progress Note  Patient Details  Name: Lori Bridges MRN: [id number] Date of Birth: [birth year]  Beginning of progress report period: July 27, 2020 End of progress report period: August 03, 2020  Today's Date: 08/03/2020 PT Individual Time: 1400-1445 PT Individual Time Calculation (min): 45 min   Patient has met 4 of 4 short term goals.  Pt making slow but appropriate progress towards goals. LTG goals have been updated to include ambulation goal. Pt with improved alertness and participation with therapies since admission. She continues to be mostly nonverbal with a few verbalizations when prompted. She shows L gaze preference and R neglect however this is improving as she is able to attend to R with max cues and with cross midline with max cues where on evaluation she was unable to even keep eyes open. Pt now requires maxA +1 for bed mobility, can sit EOB with CGA while unsupported, performs squat-pivot transfers with maxA +2, and has begun initiating pre-gait and gait in // bars with maxA +1 with close w/c follow.  Patient continues to demonstrate the following deficits muscle weakness, impaired timing and sequencing, abnormal tone, unbalanced muscle activation, decreased coordination and decreased motor planning, L gaze preference with R neglect and decreased initiation, decreased attention, decreased awareness, decreased problem solving, decreased safety awareness, decreased memory and delayed processing and therefore will continue to benefit from skilled PT intervention to increase functional independence with mobility.  Patient progressing toward long term goals.  Continue plan of care.  PT Short Term Goals Week 1:  PT Short Term Goal 1 (Week 1): Pt will complete bed mobility with maxA +1 in order to improve functional independence. PT Short Term Goal 2 (Week 1): Pt will tolerate sitting in TIS w/c for >2 hrs in order to strengthen activity tolerance PT Short Term Goal 3  (Week 1): Pt will safely perform Stedy transfer with maxA +1 from EOB <> w/c. PT Short Term Goal 4 (Week 1): Pt will maintain sitting EOB for >5 minutes with modA +1 without BUE support. Week 2:  PT Short Term Goal 1 (Week 2): Pt will perform bed mobility with modA +1 PT Short Term Goal 2 (Week 2): Pt will maintain sitting EOB unsupported for >2 minute with supervision PT Short Term Goal 3 (Week 2): Pt will perform sit<>stand transfer with modA and LRAD PT Short Term Goal 4 (Week 2): Pt will perform bed<>chair transfer with modA and LRAD PT Short Term Goal 5 (Week 2): Pt will initiate pre-gait and gait training with LRAD and maxA.  Skilled Therapeutic Interventions/Progress Updates:    Pt received sitting in TIS, nods head yes to PT session. Daughter at bedside and reports pt has been sitting in TIS w/c since after OT session which was at 0830. Pt denies pain on arrival. Pt transported to therapy gym with [place] for time management. Placed in // bars where pt required maxA +1 for sit<>stand transfer and R knee block. Once standing, pt required modA for static standing and demo's R push/lean. Able to initiate gait of ~47f with maxA for BLE facilitation and BUE advancement on // bars. Pt unable to ambulate length of // bars 2/2 decreased initiation and standing tolerance. Performed another sit<>stand transfer in // bars with maxA +1 and focused on standing tolerance and upright posture where pt was able to maintain for ~2 minutes. Noted pt to be soiled while standing, thus pt was transported back to room and performed squat-pivot transfer with maxA +2, returned  sit<>supine with maxA +1. Anticipate session was difficult for patient due to being at the end of the day and pt sitting in her w/c all day, impacting her activity tolerance. Pt performed multiple bouts of rolling L<>R with maxA +1 and was able to maintain sidelying with CGA and HR support. Moderate BM noted requiring totalA for pericare. Linen changed  and pt cleaned, ended session supine in bed with L wrist restraint donned, daughter at bedside, bed alarm on and all rails up.   Therapy Documentation Precautions:  Precautions Precautions: Fall Restrictions Weight Bearing Restrictions: No Other Position/Activity Restrictions: NPO with NGT. B soft rest restraints. Pain: Pain Assessment Pain Scale: Faces Pain Score: 0-No pain Faces Pain Scale: No hurt   Therapy/Group: Individual Therapy  Akya Fiorello P Maniya Donovan PT 08/03/2020, 12:58 PM

## 2020-08-03 NOTE — Progress Notes (Signed)
PHYSICAL MEDICINE & REHABILITATION PROGRESS NOTE  Subjective/Complaints: Patient seen laying in bed this morning.  She did not sleep well overnight per daughter, however she was not present.  Per nursing, patient slept well overnight.  No reported issues overnight.  Daughter at bedside, who calls another daughter via phone, with questions regarding NG tube as well as Ativan.  ROS: Limited due to cognition  Objective: Vital Signs: Blood pressure 115/82, pulse 85, temperature 97.9 F (36.6 C), resp. rate 18, height 5\' 6"  (1.676 m), weight 84.4 kg, SpO2 95 %. DG Abd Portable 1V  Result Date: 08/01/2020 CLINICAL DATA:  Feeding tube placement EXAM: PORTABLE ABDOMEN - 1 VIEW COMPARISON:  July 27, 2020 FINDINGS: Nasogastric tube has been removed. Feeding tube tip is in the region of the distal stomach. There is no bowel dilatation or air-fluid level to suggest bowel obstruction. No free air. There is a focal calcification in the right mid abdomen measuring 5 x 5 mm. IMPRESSION: Feeding tube tip in distal stomach. No bowel obstruction or free air. 5 mm calcification in right pelvis, potentially within the right kidney. Electronically Signed   By: Lowella Grip III M.D.   On: 08/01/2020 12:44   Recent Labs    08/03/20 0541  WBC 6.7  HGB 13.8  HCT 44.0  PLT 158   Recent Labs    08/03/20 0541  NA 133*  K 5.8*  CL 103  CO2 19*  GLUCOSE 98  BUN 39*  CREATININE 1.06*  CALCIUM 10.3    Physical Exam: BP 115/82 (BP Location: Left Arm)   Pulse 85   Temp 97.9 F (36.6 C)   Resp 18   Ht 5\' 6"  (1.676 m)   Wt 84.4 kg   SpO2 95%   BMI 30.03 kg/m  Constitutional: No distress . Vital signs reviewed. HENT: Normocephalic.  Atraumatic. +NG. Eyes: Limited right gaze.  No discharge. Cardiovascular: No JVD.  Irregularly irregular. Respiratory: Normal effort.  No stridor.  Bilateral clear to auscultation GI: Non-distended.  BS +.  Skin: Warm and dry.  Intact. Psych: Unable to  assess due to cognition. Musc: Right-sided edema.  No tenderness in extremities. Neuro: Alert Global aphasia Motor: Moving left side spontaneously Right-sided tone Right ankle clonus  Assessment/Plan: 1. 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  Physiatrist is providing close team supervision and 24 hour management of active medical problems listed below.  Physiatrist and rehab team continue to assess barriers to discharge/monitor patient progress toward functional and medical goals  Care Tool:  Bathing  Bathing activity did not occur: Safety/medical concerns (did not occur on eval, as pt was not able to be aroused awake) Body parts bathed by patient:  (did not occur on eval, as pt was not able to be aroused awak)   Body parts bathed by helper: Right arm, Left arm, Chest, Abdomen, Face Body parts n/a: Front perineal area, Buttocks, Right upper leg, Left upper leg, Right lower leg, Left lower leg   Bathing assist Assist Level: Dependent - Patient 0%     Upper Body Dressing/Undressing Upper body dressing Upper body dressing/undressing activity did not occur (including orthotics):  (did not occur on eval, as pt was not able to be aroused awake) What is the patient wearing?: Button up shirt    Upper body assist Assist  Level: Total Assistance - Patient < 25%    Lower Body Dressing/Undressing Lower body dressing      What is the patient wearing?: Incontinence brief, Pants     Lower body assist Assist for lower body dressing: Dependent - Patient 0%     Toileting Toileting    Toileting assist Assist for toileting: 2 Helpers     Transfers Chair/bed transfer  Transfers assist  Chair/bed transfer activity did not occur: Safety/medical concerns  Chair/bed transfer  assist level: 2 Helpers     Locomotion Ambulation   Ambulation assist   Ambulation activity did not occur: Safety/medical concerns          Walk 10 feet activity   Assist  Walk 10 feet activity did not occur: Safety/medical concerns        Walk 50 feet activity   Assist Walk 50 feet with 2 turns activity did not occur: Safety/medical concerns         Walk 150 feet activity   Assist Walk 150 feet activity did not occur: Safety/medical concerns         Walk 10 feet on uneven surface  activity   Assist Walk 10 feet on uneven surfaces activity did not occur: Safety/medical concerns         Wheelchair     Assist Will patient use wheelchair at discharge?: Yes Type of Wheelchair: Manual Wheelchair activity did not occur: Safety/medical concerns         Wheelchair 50 feet with 2 turns activity    Assist    Wheelchair 50 feet with 2 turns activity did not occur: Safety/medical concerns       Wheelchair 150 feet activity     Assist Wheelchair 150 feet activity did not occur: Safety/medical concerns          Medical Problem List and Plan: 1.    Left spastic hemiparesis, global aphasia secondary to small left frontal medial ICH and left temporal occipital ICH in the setting of chronic Coumadin/likely hypertensive  Continue CIR  WHO/PRAFO nightly 2.  Antithrombotics: -DVT/anticoagulation: Subcutaneous heparin for DVT prophylaxis initiated July 25, 2020             -antiplatelet therapy: N/A 3. Pain Management: Tylenol as needed  Appears controlled on 8/6 4. Mood: D/c Ativan due to lethargy.              -antipsychotic agents: N/A 5. Neuropsych: This patient is not capable of making decisions on her own behalf.  Continue wrist restraints for safety 6. Skin/Wound Care: Routine skin checks 7. Fluids/Electrolytes/Nutrition: Routine in and outs. 8.  Seizure prophylaxis.    Keppra 1000 mg twice daily.  EEG negative  Plan to wean  next week 9.  Atrial fibrillation.  Chronic Coumadin reversed.  Cardiac rate controlled. 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  D1 thins with nasogastric tube feeds.  Dietary follow-up  Plan to DC NG if patient able to maintain oral nutrition today  Advance diet as tolerated 11.  Hypertension.  Lasix 20 mg daily, Avapro 150 mg daily, Toprol XL 100 mg daily currently on hold.    Continue hydralazine 25 mg twice daily,   Continue Lopressor 75 BID  Resume as needed  Relatively controlled on 8/6 12.  Hypothyroidism.  Synthroid 13.  Hyperlipidemia.  Statin on hold in the setting of ICH 14.  UTI/E. coli.  Completed course of Rocephin 15.  GERD.  Continue Protonix  16.  Sodium  Sodium 133 on 8/6, labs ordered for tomorrow 17.  Thrombocytopenia  Platelets 158 on 8/6  Continue to monitor 18.  Leukopenia  WBCs 6.7 on 8/6 19.  Hyperkalemia  Potassium 5.8 on 8/6, labs ordered for tomorrow  [facility] ordered on 8/6 20.  CKD III  Creatinine 1.06 on 8/6, labs ordered for Monday 21.?  Sleep disturbance-per daughter  Melatonin ordered  LOS: 8 days A FACE TO FACE EVALUATION WAS PERFORMED  Heena Woodbury [name] 08/03/2020, 9:56 AM

## 2020-08-03 NOTE — Progress Notes (Signed)
Speech Language Pathology Weekly Progress and Session Note  Patient Details  Name: Lori Bridges MRN: [id number] Date of Birth: Apr 07, 1930  Beginning of progress report period: July 27, 2020 End of progress report period: August 03, 2020  Today's Date: 08/03/2020 SLP Individual Time: 1100-1155 SLP Individual Time Calculation (min): 55 min  Short Term Goals: Week 1: SLP Short Term Goal 1 (Week 1): Pt will remain awake (eyes opened) for 5-7 minute intervals with max assist multimodal cues. SLP Short Term Goal 1 - Progress (Week 1): Met SLP Short Term Goal 2 (Week 1): Pt will sustain her attention to basic, familiar tasks for 1 minute intervals with max assist multimodal cues. SLP Short Term Goal 2 - Progress (Week 1): Met SLP Short Term Goal 3 (Week 1): Pt will complete basic, familiar tasks with max assist for functional problem solving. SLP Short Term Goal 3 - Progress (Week 1): Progressing toward goal SLP Short Term Goal 4 (Week 1): Pt will consume therapeutic trials of thin liquids and purees with max assist multimodal cues for swallow initiation and without respiratory decompensation over 3 consecutive sessions. SLP Short Term Goal 4 - Progress (Week 1): Met    New Short Term Goals: Week 2: SLP Short Term Goal 1 (Week 2): Pt will demonstrate efficient mastication and oral clearance with dysphagia 2 texture trials across 2 sessions prior to advancement. SLP Short Term Goal 2 (Week 2): Pt will sustain her attention to basic, familiar tasks for 5 minute intervals with mod assist multimodal cues. SLP Short Term Goal 3 (Week 2): Pt will complete basic, familiar tasks with max assist for functional problem solving. SLP Short Term Goal 4 (Week 2): Pt will recall functional information relating to therapy sessions with Max A SLP Short Term Goal 5 (Week 2): Pt will initiate during tasks with max A multimodal cueing.  Weekly Progress Updates: Pt has made excellent gains and has met 3/4 short  term goals this reporting period due to improvement in alertness, sustained attention and tolerance of dysphagia 1 diet and thin liquids. Dys 1 diet with thin liquids initiated 07/31/2020. Pt demonstrates increased attention and po acceptance during sessions. Pt is able to name simple objects with min A verbal cues. Pt responds to questions in context with max A and occasionally produces phrases to respond to simple questions. Pt continues with decreased verbal output however in diagnostic treatment communication deficit does not appear to be language based; appears more due to cognitive deficits. Currently, patient continues to require total A with feeding, Max A for following simple commands and  verbal responses to y/n questions in context. Pt and family education is ongoing Pt would benefit from continued skilled SLP intervention to maximize cognition and swallow function.      Intensity: Minumum of 1-2 x/day, 30 to 90 minutes Frequency: 3 to 5 out of 7 days Duration/Length of Stay: 8/25 Treatment/Interventions: Cognitive remediation/compensation;Cueing hierarchy;Dysphagia/aspiration precaution training;Functional tasks;Internal/external aids;Patient/family education   Daily Session  Skilled Therapeutic Interventions: Skilled SLP intervention focused on cognition and dysphagia. Pt seen with trials of dys 2 peaches in fruit cup. Attempted self feeding with snack however pt did not follow commands to hold spoon and pulled hand away with hand over hand. Prolonged mastication demonstrated with peaches and pt eventually orally expectorated tiny fruit pieces from mouth. No overt s/sx of aspiration or penetration noted with trial. Decreased attention demonstrated this session due to lethargy. Max A multimodal stimulation and cueing was provided to redirect, initiate and engage  patient during task. Pt stated "this is stupid" during task for pt to place cards in pile by color. Pt was able to verbalize color of  cards with min a verbal cues but pulled hand away when attempting hand over hand for pt to place each card in to separate piles by color. Pt left sitting in chair with alarm on and wrist restraints reapplied and daughter present. Cont with therapy per plan of care.      General    Pain Pain Assessment Pain Scale: Faces Pain Score: 0-No pain Faces Pain Scale: No hurt  Therapy/Group: Individual Therapy  Darrol Poke Krisandra Bueno 08/03/2020, 12:56 PM

## 2020-08-03 NOTE — Progress Notes (Signed)
Occupational Therapy Weekly Progress Note  Patient Details  Name: Lori Bridges MRN: [id number] Date of Birth: 10/26/1930  Beginning of progress report period: July 28, 2020 End of progress report period: August 03, 2020  Today's Date: 08/03/2020 OT Individual Time: 7124-5809 OT Individual Time Calculation (min): 60 min    Patient has met 3 of 4 short term goals.  Pt has made some gradual progress this week.  She is now sitting at EOB for longer and with improved postural control, visually tracking to the R more spontaneously and turning her head to the R.  She has become more alert these last 2 days and has been able to participate more.  She only answers questions on occasion and if so, one word at a time.  Improving bed mobility.   Her family is present frequently and involved in her care.   Patient continues to demonstrate the following deficits: muscle weakness and muscle joint tightness, decreased cardiorespiratoy endurance, abnormal tone, unbalanced muscle activation, motor apraxia and decreased coordination, decreased visual perceptual skills and decreased visual motor skills, right side neglect, decreased initiation, decreased attention, decreased awareness, decreased problem solving, decreased safety awareness, decreased memory and delayed processing and decreased sitting balance, decreased standing balance, decreased postural control and hemiplegia and therefore will continue to benefit from skilled OT intervention to enhance overall performance with BADL and Reduce care partner burden.  Patient progressing toward long term goals..  Continue plan of care.  OT Short Term Goals Week 1:  OT Short Term Goal 1 (Week 1): Pt will demonstrate alertness to engage in UB bathing with max A. OT Short Term Goal 1 - Progress (Week 1): Met OT Short Term Goal 2 (Week 1): Pt will follow 1 step commands with 50 % accuracy with donning UB clothing with max A. OT Short Term Goal 2 - Progress (Week 1):  Progressing toward goal OT Short Term Goal 3 (Week 1): Pt will sit to EOB with max a. OT Short Term Goal 3 - Progress (Week 1): Met OT Short Term Goal 4 (Week 1): Pt will maintain static sitting for 5 min at EOB with mod A to prepare her for sitting on a BSC. OT Short Term Goal 4 - Progress (Week 1): Met Week 2:  OT Short Term Goal 1 (Week 2): Pt will demonstrate improved attention and initiation to bathe UB with mod A. OT Short Term Goal 2 (Week 2): Pt will demonstrate improved attention to R side to doff/ don shirt over R arm with mod A. OT Short Term Goal 3 (Week 2): when in stedy lift, pt will be able to achieve upright stand with mod A of 1 during LB cleansing/dressing. OT Short Term Goal 4 (Week 2): Pt will demonstrate improved attention to R arm with performing self ROM with mod cues and  min A.  Skilled Therapeutic Interventions/Progress Updates:    Pt received in bed with daughter in the room. Pt with eyes open and not resistive when I said the plan was to sit up and get bathed and dressed.  Mod A to roll to side and max to move to EOB.  Once at EOB pt held balance, with mod A and intermittent CGA.  Worked on UB b/d with max to total A as pt highly distracted and not attending to R side or to task.  Using stedy lift with +2 A, pt rose to stand with max A of 2 and needed max A to stand in stedy (with  support of knee pads) as soiled brief removed, pt cleansed, new brief donned and pants pulled up.    Pt does have improved R grasp and was able to keep hand on stedy bar.    Transferred to [place] wc via stedy. Pt able to sit upright in chair at sink for her daughter to fix her hair.  Attempted to have pt do self ROM of RUE, but pt not able to attend to task.  Pt positioned with lap tray and educated dtr on how to facilitate pt working on A/arom of RUE.     Pt resting in chair with belt alarm, lap tray and dtr with pt.  Applied L wrist restraint to avoid her pulling on NG tube.   Therapy  Documentation Precautions:  Precautions Precautions: Fall Restrictions Weight Bearing Restrictions: No Other Position/Activity Restrictions: NPO with NGT. B soft rest restraints.      Pain: Pain Assessment Pain Score: 0-No pain    Therapy/Group: Individual Therapy  [place] 08/03/2020, 12:12 PM

## 2020-08-04 ENCOUNTER — Inpatient Hospital Stay (HOSPITAL_COMMUNITY): Payer: Medicare Other | Admitting: Physical Therapy

## 2020-08-04 ENCOUNTER — Inpatient Hospital Stay (HOSPITAL_COMMUNITY): Payer: Medicare Other | Admitting: Speech Pathology

## 2020-08-04 ENCOUNTER — Inpatient Hospital Stay (HOSPITAL_COMMUNITY): Payer: Medicare Other

## 2020-08-04 LAB — BASIC METABOLIC PANEL
Anion gap: 10 (ref 5–15)
BUN: 42 mg/dL — ABNORMAL HIGH (ref 8–23)
CO2: 21 mmol/L — ABNORMAL LOW (ref 22–32)
Calcium: 10.3 mg/dL (ref 8.9–10.3)
Chloride: 100 mmol/L (ref 98–111)
Creatinine, Ser: 1.08 mg/dL — ABNORMAL HIGH (ref 0.44–1.00)
GFR calc Af Amer: 53 mL/min — ABNORMAL LOW (ref >60)
GFR calc non Af Amer: 46 mL/min — ABNORMAL LOW (ref >60)
Glucose, Bld: 107 mg/dL — ABNORMAL HIGH (ref 70–99)
Potassium: 5.8 mmol/L — ABNORMAL HIGH (ref 3.5–5.1)
Sodium: 131 mmol/L — ABNORMAL LOW (ref 135–145)

## 2020-08-04 MED ORDER — SODIUM ZIRCONIUM CYCLOSILICATE 5 G PO PACK
5.0000 g | PACK | Freq: Once | ORAL | Status: AC
  Administered 2020-08-04: 5 g via ORAL
  Filled 2020-08-04: qty 1

## 2020-08-04 MED ORDER — OSMOLITE 1.2 CAL PO LIQD
480.0000 mL | ORAL | Status: DC
  Administered 2020-08-04: 480 mL
  Filled 2020-08-04: qty 711

## 2020-08-04 NOTE — Progress Notes (Signed)
Occupational Therapy Session Note  Patient Details  Name: Lori Bridges MRN: [id number] Date of Birth: October 06, 1930  Today's Date: 08/04/2020 OT Individual Time: 0900-1000 OT Individual Time Calculation (min): 60 min    Short Term Goals: Week 1:  OT Short Term Goal 1 (Week 1): Pt will demonstrate alertness to engage in UB bathing with max A. OT Short Term Goal 1 - Progress (Week 1): Met OT Short Term Goal 2 (Week 1): Pt will follow 1 step commands with 50 % accuracy with donning UB clothing with max A. OT Short Term Goal 2 - Progress (Week 1): Progressing toward goal OT Short Term Goal 3 (Week 1): Pt will sit to EOB with max a. OT Short Term Goal 3 - Progress (Week 1): Met OT Short Term Goal 4 (Week 1): Pt will maintain static sitting for 5 min at EOB with mod A to prepare her for sitting on a BSC. OT Short Term Goal 4 - Progress (Week 1): Met  Skilled Therapeutic Interventions/Progress Updates:    1;1. Pt received in bed agreeable to OT with daughter present. Pt much more alert than last week smirking when daughter remarking how pt surprised her when she attempted to pull NG tube out with RUE. Pt requries MAX A+2 to roll in B directions for LB dressing, peri care and LB bathing with increased time for initiation and multimodal cuing. Pt completes supine>sitting with MAX A of 1 to sit EOB with anywhere fro S-MOD A for sitting balance. MAX A +2 SBT to TIS with pt moving hand placement with VC. Pt requires tactile cues for UB Bathing initiation and total A for dressing. Pt requires total A for oral care and visual demonstration to open mouth for OT to remove dentures. Exited session with pt seated in TIS, belt exit alarm on and call light in reach  Therapy Documentation Precautions:  Precautions Precautions: Fall Restrictions Weight Bearing Restrictions: No Other Position/Activity Restrictions: NPO with NGT. B soft rest restraints. General:   Vital Signs:   Pain:   ADL:   Vision    Perception    Praxis   Exercises:   Other Treatments:     Therapy/Group: Individual Therapy  Tonny [place] 08/04/2020, 10:01 AM

## 2020-08-04 NOTE — Progress Notes (Signed)
Physical Therapy Session Note  Patient Details  Name: Lori Bridges MRN: [id number] Date of Birth: [birth year]  Today's Date: 08/04/2020 PT Individual Time: 0712-1975 PT Individual Time Calculation (min): 54 min   Short Term Goals: Week 1:  PT Short Term Goal 1 (Week 1): Pt will complete bed mobility with maxA +1 in order to improve functional independence. PT Short Term Goal 2 (Week 1): Pt will tolerate sitting in TIS w/c for >2 hrs in order to strengthen activity tolerance PT Short Term Goal 3 (Week 1): Pt will safely perform Stedy transfer with maxA +1 from EOB <> w/c. PT Short Term Goal 4 (Week 1): Pt will maintain sitting EOB for >5 minutes with modA +1 without BUE support.  Skilled Therapeutic Interventions/Progress Updates: Pt presents semi-reclined in bed w/ daughter present.  Pt agreeable o therapy.  Pt required max A for rolling to right w/ verbal and visual cues for use of right side rail.  Max A required for side-lying to sit although once initiated, pt required decreased assist and then scooting to EOB.  Pt sat EOB w/o PT support to begin donning shoes.  Pt able to attempt bringing L foot over right knee in Figure-4 position to don L shoe, but retropulsing and assisted by PT.  Pt performed sit to stand w/ max A and blocking of R knee.  Pt able to step to w/c w/ max A and weight shifting by PT as well as manual cues from RT.  Pt taken to gym for use of // bars.  Pt stood x 2 w/ max A and placement of hands on // bars.  Pt w/ increasing fatigue and able to amb 3' w/ wt shifting, moving UEs and max encouragement w/ stepping, blocking of R knee.  Pt encouraged to tend to right side as holds head to left.  Pot returned to room and performed max A SPT w/c to bed and max A for sit to right side-lying.  Bed alarm on and all needs in reach.  Dtr present at conclusion of therapy.     Therapy Documentation Precautions:  Precautions Precautions: Fall Restrictions Weight Bearing Restrictions:  No Other Position/Activity Restrictions: NPO with NGT. B soft rest restraints. General:   Vital Signs: Therapy Vitals Temp: 98.1 F (36.7 C) Pulse Rate: 97 Resp: 16 BP: (!) 104/56 Patient Position (if appropriate): Lying Oxygen Therapy SpO2: 99 % O2 Device: Room Air Pain: does not appear to have pain. Pain Assessment Pain Scale: Faces Faces Pain Scale: No hurt Mobility:      Therapy/Group: Individual Therapy  Lori Bridges 08/04/2020, 3:57 PM

## 2020-08-04 NOTE — Progress Notes (Signed)
Speech Language Pathology Daily Session Note  Patient Details  Name: Lori Bridges MRN: [id number] Date of Birth: 01/15/1930  Today's Date: 08/04/2020 SLP Individual Time: 1305-1330 SLP Individual Time Calculation (min): 25 min  Short Term Goals: Week 2: SLP Short Term Goal 1 (Week 2): Pt will demonstrate efficient mastication and oral clearance with dysphagia 2 texture trials across 2 sessions prior to advancement. SLP Short Term Goal 2 (Week 2): Pt will sustain her attention to basic, familiar tasks for 5 minute intervals with mod assist multimodal cues. SLP Short Term Goal 3 (Week 2): Pt will complete basic, familiar tasks with max assist for functional problem solving. SLP Short Term Goal 4 (Week 2): Pt will recall functional information relating to therapy sessions with Max A SLP Short Term Goal 5 (Week 2): Pt will initiate during tasks with max A multimodal cueing.  Skilled Therapeutic Interventions:  Pt was seen for skilled ST targeting goals for dysphagia and cognition.  Pt consumed dys 1 textures and thin liquids via straw without overt s/s of aspiration across either consistency.  Oral phase was timely and efficient for clearing boluses without leaving residue post swallow.  Pt required hand over hand to initiate scooping food from containers but then brought food to mouth with min assist.  Pt could convey basic needs and wants functionally in response to questions that were either yes/no format or close ended/choice of two with mod assist and extra time.  Pt remained alert and attentive to tasks for the entirety of today's therapy session.  Pt was left in bed with restraints reapplied and daughter at bedside.  Continue per current plan of care.   Pain Pain Assessment Pain Scale: 0-10 Pain Score: 0-No pain Faces Pain Scale: No hurt  Therapy/Group: Individual Therapy  Lori Bridges, Lori Bridges 08/04/2020, 3:58 PM

## 2020-08-04 NOTE — Progress Notes (Signed)
[facility] PHYSICAL MEDICINE & REHABILITATION PROGRESS NOTE  Subjective/Complaints: Daughter at bedside, discussed intake.  Patient is eating better.  Averaging around 50% meal intake, taking pills by mouth  ROS: Limited due to cognition  Objective: Vital Signs: Blood pressure 106/62, pulse 73, temperature 98.2 F (36.8 C), resp. rate 16, height 5\' 6"  (1.676 m), weight 84.4 kg, SpO2 98 %. No results found. Recent Labs    08/03/20 0541  WBC 6.7  HGB 13.8  HCT 44.0  PLT 158   Recent Labs    08/03/20 0541  NA 133*  K 5.8*  CL 103  CO2 19*  GLUCOSE 98  BUN 39*  CREATININE 1.06*  CALCIUM 10.3    Physical Exam: BP 106/62   Pulse 73   Temp 98.2 F (36.8 C)   Resp 16   Ht 5\' 6"  (1.676 m)   Wt 84.4 kg   SpO2 98%   BMI 30.03 kg/m  Constitutional: No distress . Vital signs reviewed. HENT: Normocephalic.  Atraumatic. +NG. Eyes: Limited right gaze.  No discharge. Cardiovascular: No JVD.  Irregularly irregular. Respiratory: Normal effort.  No stridor.  Bilateral clear to auscultation GI: Non-distended.  BS +.  Skin: Warm and dry.  Intact. Psych: Unable to assess due to cognition. Musc: Right-sided edema.  No tenderness in extremities. Neuro: Alert Global aphasia Motor: Moving left side spontaneously Right-sided tone Right ankle clonus  Assessment/Plan: 1. 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  Physiatrist is providing close team supervision and 24 hour management of active medical problems listed below.  Physiatrist and rehab team continue to assess barriers to discharge/monitor patient progress toward functional and medical goals  Care Tool:  Bathing  Bathing activity did not occur: Safety/medical concerns (did not occur on eval, as pt was not able  to be aroused awake) Body parts bathed by patient: Chest   Body parts bathed by helper: Right arm, Left arm, Abdomen, Front perineal area, Buttocks, Right upper leg, Left upper leg, Right lower leg, Left lower leg, Face Body parts n/a: Front perineal area, Buttocks, Right upper leg, Left upper leg, Right lower leg, Left lower leg   Bathing assist Assist Level: Maximal Assistance - Patient 24 - 49%     Upper Body Dressing/Undressing Upper body dressing Upper body dressing/undressing activity did not occur (including orthotics):  (did not occur on eval, as pt was not able to be aroused awake) What is the patient wearing?: Button up shirt    Upper body assist Assist Level: Total Assistance - Patient < 25%    Lower Body Dressing/Undressing Lower body dressing      What is the patient wearing?: Incontinence brief, Pants     Lower body assist Assist for lower body dressing: Dependent - Patient 0%     Toileting Toileting    Toileting assist Assist for toileting: 2 Helpers     Transfers Chair/bed transfer  Transfers assist  Chair/bed transfer activity did not occur: Safety/medical concerns  Chair/bed transfer assist level: 2 Helpers     Locomotion Ambulation   Ambulation assist   Ambulation activity did not occur: Safety/medical concerns          Walk 10 feet activity   Assist  Walk 10 feet activity did not occur: Safety/medical concerns        Walk 50 feet  activity   Assist Walk 50 feet with 2 turns activity did not occur: Safety/medical concerns         Walk 150 feet activity   Assist Walk 150 feet activity did not occur: Safety/medical concerns         Walk 10 feet on uneven surface  activity   Assist Walk 10 feet on uneven surfaces activity did not occur: Safety/medical concerns         Wheelchair     Assist Will patient use wheelchair at discharge?: Yes Type of Wheelchair: Manual Wheelchair activity did not occur:  Safety/medical concerns         Wheelchair 50 feet with 2 turns activity    Assist    Wheelchair 50 feet with 2 turns activity did not occur: Safety/medical concerns       Wheelchair 150 feet activity     Assist Wheelchair 150 feet activity did not occur: Safety/medical concerns          Medical Problem List and Plan: 1.    Left spastic hemiparesis, global aphasia secondary to small left frontal medial ICH and left temporal occipital ICH in the setting of chronic Coumadin/likely hypertensive  Continue CIR  WHO/PRAFO nightly 2.  Antithrombotics: -DVT/anticoagulation: Subcutaneous heparin for DVT prophylaxis initiated July 25, 2020             -antiplatelet therapy: N/A 3. Pain Management: Tylenol as needed  Appears controlled on 8/6 4. Mood: D/c Ativan due to lethargy.              -antipsychotic agents: N/A 5. Neuropsych: This patient is not capable of making decisions on her own behalf.  Continue wrist restraints for safety 6. Skin/Wound Care: Routine skin checks 7. Fluids/Electrolytes/Nutrition: Routine in and outs. 8.  Seizure prophylaxis.    Keppra 1000 mg twice daily.  EEG negative  Plan to wean next week 9.  Atrial fibrillation.  Chronic Coumadin reversed.  Cardiac rate controlled. 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  D1 thins with nasogastric tube feeds.  Dietary follow-up  Plan to DC NG if patient able to maintain oral nutrition today, will reduce tube feeds to supply half of calculated needs.  Make sure patient can consistently take greater than 50% meals  Advance diet as tolerated 11.  Hypertension.  Lasix 20 mg daily, Avapro 150 mg daily, Toprol XL 100 mg daily currently on hold.    Continue hydralazine 25 mg twice daily,   Continue Lopressor 75 BID  Resume as needed  Relatively controlled on 8/6 12.  Hypothyroidism.  Synthroid 13.  Hyperlipidemia.  Statin on hold in the setting of ICH 14.   UTI/E. coli.  Completed course of Rocephin 15.  GERD.  Continue Protonix  16.  Sodium  Sodium 133 on 8/6, labs ordered for tomorrow 17.  Thrombocytopenia  Platelets 158 on 8/6  Continue to monitor 18.  Leukopenia  WBCs 6.7 on 8/6 19.  Hyperkalemia  Potassium 5.8 on 8/6, labs ordered for tomorrow  [facility] ordered on 8/6 20.  CKD III  Creatinine 1.06 on 8/6, labs ordered for Monday 21.?  Sleep disturbance-per daughter  Melatonin ordered  LOS: 9 days A FACE TO [place] E Christen [place]rop 08/04/2020, 12:13 PM

## 2020-08-04 NOTE — Plan of Care (Signed)
°  Problem: RH BOWEL ELIMINATION Goal: RH STG MANAGE BOWEL WITH ASSISTANCE Description: STG Manage Bowel with max Assistance. Outcome: Not Progressing Goal: RH STG MANAGE BOWEL W/MEDICATION W/ASSISTANCE Description: STG Manage Bowel with Medication with max Assistance. Outcome: Not Progressing   Problem: RH BLADDER ELIMINATION Goal: RH STG MANAGE BLADDER WITH ASSISTANCE Description: STG Manage Bladder With max Assistance Outcome: Not Progressing Goal: RH STG MANAGE BLADDER WITH MEDICATION WITH ASSISTANCE Description: STG Manage Bladder With Medication With max Assistance. Outcome: Not Progressing Goal: RH STG MANAGE BLADDER WITH EQUIPMENT WITH ASSISTANCE Description: STG Manage Bladder With Equipment With max Assistance Outcome: Not Progressing   Problem: RH SKIN INTEGRITY Goal: RH STG SKIN FREE OF INFECTION/BREAKDOWN Outcome: Not Progressing Goal: RH STG MAINTAIN SKIN INTEGRITY WITH ASSISTANCE Description: STG Maintain Skin Integrity With max Assistance. Outcome: Not Progressing Goal: RH STG ABLE TO PERFORM INCISION/WOUND CARE W/ASSISTANCE Description: STG Able To Perform Incision/Wound Care With max Assistance. Outcome: Not Progressing   Problem: RH SAFETY Goal: RH STG ADHERE TO SAFETY PRECAUTIONS W/ASSISTANCE/DEVICE Description: STG Adhere to Safety Precautions With mod Assistance/Device. Outcome: Not Progressing Goal: RH STG DECREASED RISK OF FALL WITH ASSISTANCE Description: STG Decreased Risk of Fall With mod Assistance. Outcome: Not Progressing   Problem: RH COGNITION-NURSING Goal: RH STG USES MEMORY AIDS/STRATEGIES W/ASSIST TO PROBLEM SOLVE Description: STG Uses Memory Aids/Strategies With mod Assistance to Problem Solve. Outcome: Not Progressing Goal: RH STG ANTICIPATES NEEDS/CALLS FOR ASSIST W/ASSIST/CUES Description: STG Anticipates Needs/Calls for Assist With mod Assistance/Cues. Outcome: Not Progressing   Problem: RH KNOWLEDGE DEFICIT Goal: RH STG INCREASE  KNOWLEDGE OF DIABETES Description: Patient will be able to manage Diabetes and identify signs of hypo/hperglycemia while utilizing educational material provided with mod assist  Outcome: Not Progressing Goal: RH STG INCREASE KNOWLEDGE OF HYPERTENSION Description: Patient will be able to manage high blood pressure while utilizing educational material provided with min assist Outcome: Not Progressing Goal: RH STG INCREASE KNOWLEDGE OF DYSPHAGIA/FLUID INTAKE Description: Patient will be able to manage fluid management and swallowing techniques while utilizing educational material provided with min assist  Outcome: Not Progressing Goal: RH STG INCREASE KNOWLEGDE OF HYPERLIPIDEMIA Description: Patient will be able to manage high cholesterol while utilizing educational instructions provided with min assist  Outcome: Not Progressing Goal: RH STG INCREASE KNOWLEDGE OF STROKE PROPHYLAXIS Description: Patient will be able identify medications for stroke prophylaxis while utilizing educational material provided with min assist  Outcome: Not Progressing

## 2020-08-05 ENCOUNTER — Inpatient Hospital Stay (HOSPITAL_COMMUNITY): Payer: Medicare Other

## 2020-08-05 ENCOUNTER — Inpatient Hospital Stay (HOSPITAL_COMMUNITY): Payer: Medicare Other | Admitting: Occupational Therapy

## 2020-08-05 LAB — BASIC METABOLIC PANEL
Anion gap: 8 (ref 5–15)
BUN: 47 mg/dL — ABNORMAL HIGH (ref 8–23)
CO2: 24 mmol/L (ref 22–32)
Calcium: 10.1 mg/dL (ref 8.9–10.3)
Chloride: 100 mmol/L (ref 98–111)
Creatinine, Ser: 1.05 mg/dL — ABNORMAL HIGH (ref 0.44–1.00)
GFR calc Af Amer: 55 mL/min — ABNORMAL LOW (ref >60)
GFR calc non Af Amer: 47 mL/min — ABNORMAL LOW (ref >60)
Glucose, Bld: 105 mg/dL — ABNORMAL HIGH (ref 70–99)
Potassium: 5.5 mmol/L — ABNORMAL HIGH (ref 3.5–5.1)
Sodium: 132 mmol/L — ABNORMAL LOW (ref 135–145)

## 2020-08-05 MED ORDER — OSMOLITE 1.2 CAL PO LIQD
480.0000 mL | ORAL | Status: DC
  Administered 2020-08-05: 480 mL

## 2020-08-05 MED ORDER — SODIUM ZIRCONIUM CYCLOSILICATE 10 G PO PACK
10.0000 g | PACK | Freq: Once | ORAL | Status: AC
  Administered 2020-08-05: 10 g via ORAL
  Filled 2020-08-05: qty 1

## 2020-08-05 NOTE — Progress Notes (Signed)
[facility]ICAL MEDICINE & REHABILITATION PROGRESS NOTE  Subjective/Complaints:  No issues overnight, reviewed blood work ROS: Limited due to cognition  Objective: Vital Signs: Blood pressure 101/74, pulse 86, temperature 98.5 F (36.9 C), resp. rate 16, height 5\' 6"  (1.676 m), weight 86.9 kg, SpO2 97 %. No results found. Recent Labs    08/03/20 0541  WBC 6.7  HGB 13.8  HCT 44.0  PLT 158   Recent Labs    08/04/20 1109 08/05/20 0345  NA 131* 132*  K 5.8* 5.5*  CL 100 100  CO2 21* 24  GLUCOSE 107* 105*  BUN 42* 47*  CREATININE 1.08* 1.05*  CALCIUM 10.3 10.1    Physical Exam: BP 101/74   Pulse 86   Temp 98.5 F (36.9 C)   Resp 16   Ht 5\' 6"  (1.676 m)   Wt 86.9 kg   SpO2 97%   BMI 30.92 kg/m   General: No acute distress Mood and affect are appropriate Heart: Regular rate and rhythm no rubs murmurs or extra sounds Lungs: Clear to auscultation, breathing unlabored, no rales or wheezes Abdomen: Positive bowel sounds, soft nontender to palpation, nondistended Extremities: No clubbing, cyanosis, or edema Skin: No evidence of breakdown, no evidence of rash  Neuro: Alert Global aphasia Motor: Moving left side spontaneously Right-sided tone Right ankle clonus Motor strength is 4/5 in the left deltoid bicep tricep grip hip flexor knee extensor ankle dorsiflexion 4 -/5 in the right hip flexor knee extensor ankle dorsiflexor deltoid bicep tricep grip Assessment/Plan: 1. 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  Physiatrist is providing close team supervision and 24 hour management of active medical problems listed below.  Physiatrist and rehab team continue to assess barriers to discharge/monitor patient progress toward functional and medical  goals  Care Tool:  Bathing  Bathing activity did not occur: Safety/medical concerns (did not occur on eval, as pt was not able to be aroused awake) Body parts bathed by patient: Right arm, Left arm, Chest, Abdomen, Face   Body parts bathed by helper: Front perineal area, Buttocks, Right upper leg, Left upper leg, Right lower leg, Left lower leg Body parts n/a: Front perineal area, Buttocks, Right upper leg, Left upper leg, Right lower leg, Left lower leg   Bathing assist Assist Level: Maximal Assistance - Patient 24 - 49%     Upper Body Dressing/Undressing Upper body dressing Upper body dressing/undressing activity did not occur (including orthotics):  (did not occur on eval, as pt was not able to be aroused awake) What is the patient wearing?: Button up shirt    Upper body assist Assist Level: Maximal Assistance - Patient 25 - 49%    Lower Body Dressing/Undressing Lower body dressing      What is the patient wearing?: Incontinence brief, Pants     Lower body assist Assist for lower body dressing: Total Assistance - Patient < 25%     Toileting Toileting    Toileting assist Assist for toileting: 2 Helpers     Transfers Chair/bed transfer  Transfers assist  Chair/bed transfer activity did not occur: Safety/medical concerns  Chair/bed transfer assist level: Maximal Assistance - Patient 25 - 49% (assist of 2nd person standby.)     Locomotion Ambulation   Ambulation assist   Ambulation activity did not occur: Safety/medical concerns  Assist level:  Total Assistance - Patient < 25% Assistive device: Parallel bars Max distance: 3   Walk 10 feet activity   Assist  Walk 10 feet activity did not occur: Safety/medical concerns        Walk 50 feet activity   Assist Walk 50 feet with 2 turns activity did not occur: Safety/medical concerns         Walk 150 feet activity   Assist Walk 150 feet activity did not occur: Safety/medical concerns          Walk 10 feet on uneven surface  activity   Assist Walk 10 feet on uneven surfaces activity did not occur: Safety/medical concerns         Wheelchair     Assist Will patient use wheelchair at discharge?: Yes Type of Wheelchair: Manual Wheelchair activity did not occur: Safety/medical concerns         Wheelchair 50 feet with 2 turns activity    Assist    Wheelchair 50 feet with 2 turns activity did not occur: Safety/medical concerns       Wheelchair 150 feet activity     Assist Wheelchair 150 feet activity did not occur: Safety/medical concerns          Medical Problem List and Plan: 1.    Left spastic hemiparesis, global aphasia secondary to small left frontal medial ICH and left temporal occipital ICH in the setting of chronic Coumadin/likely hypertensive  Continue CIR  WHO/PRAFO nightly 2.  Antithrombotics: -DVT/anticoagulation: Subcutaneous heparin for DVT prophylaxis initiated July 25, 2020             -antiplatelet therapy: N/A 3. Pain Management: Tylenol as needed  Appears controlled on 8/8 4. Mood: D/c Ativan due to lethargy.              -antipsychotic agents: N/A 5. Neuropsych: This patient is not capable of making decisions on her own behalf.  Continue wrist restraints for safety 6. Skin/Wound Care: Routine skin checks 7. Fluids/Electrolytes/Nutrition: Routine in and outs. 8.  Seizure prophylaxis.    Keppra 1000 mg twice daily.  EEG negative  Plan to wean next week 9.  Atrial fibrillation.  Chronic Coumadin reversed.  Cardiac rate controlled. 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  D1 thins with nasogastric tube feeds.  Dietary follow-up  Plan to DC NG if patient able to maintain oral nutrition today, will reduce tube feeds to supply half of calculated needs.  Make sure patient can consistently take greater than 50% meals, intake still inconsistent may be related to cognition  Advance  diet as tolerated 11.  Hypertension.  Lasix 20 mg daily, Avapro 150 mg daily, Toprol XL 100 mg daily currently on hold.    Continue hydralazine 25 mg twice daily,   Continue Lopressor 75 BID  Resume as needed  Relatively controlled on 8/6 12.  Hypothyroidism.  Synthroid 13.  Hyperlipidemia.  Statin on hold in the setting of ICH 14.  UTI/E. coli.  Completed course of Rocephin 15.  GERD.  Continue Protonix  16.  Sodium  Sodium 132 on 8/8, labs ordered for tomorrow 17.  Thrombocytopenia  Platelets 158 on 8/6  Repeat in a.m. 18.  Leukopenia  Resolved 19.  Hyperkalemia  Potassium 5.8 on 8/6, labs ordered for tomorrow  [facility] ordered on 8/6, repeated on 8/7 5 mg, potassium 5.5 on 8/8.  Increase Lokelma to 10 mg repeat blood work in a.m. 20.  CKD III  Creatinine 1.06 on 8/6, labs ordered for Monday 21.?  Sleep disturbance-per daughter  Melatonin ordered  LOS: 10 days A FACE TO FACE EVALUATION WAS PERFORMED  Charlett Blake 08/05/2020, 10:49 AM

## 2020-08-05 NOTE — Plan of Care (Signed)
  Problem: RH BOWEL ELIMINATION Goal: RH STG MANAGE BOWEL WITH ASSISTANCE Description: STG Manage Bowel with max Assistance. Outcome: Progressing Goal: RH STG MANAGE BOWEL W/MEDICATION W/ASSISTANCE Description: STG Manage Bowel with Medication with max Assistance. Outcome: Progressing   Problem: RH BLADDER ELIMINATION Goal: RH STG MANAGE BLADDER WITH ASSISTANCE Description: STG Manage Bladder With max Assistance Outcome: Progressing Goal: RH STG MANAGE BLADDER WITH MEDICATION WITH ASSISTANCE Description: STG Manage Bladder With Medication With max Assistance. Outcome: Progressing Goal: RH STG MANAGE BLADDER WITH EQUIPMENT WITH ASSISTANCE Description: STG Manage Bladder With Equipment With max Assistance Outcome: Progressing   Problem: RH SKIN INTEGRITY Goal: RH STG SKIN FREE OF INFECTION/BREAKDOWN Outcome: Progressing Goal: RH STG MAINTAIN SKIN INTEGRITY WITH ASSISTANCE Description: STG Maintain Skin Integrity With max Assistance. Outcome: Progressing Goal: RH STG ABLE TO PERFORM INCISION/WOUND CARE W/ASSISTANCE Description: STG Able To Perform Incision/Wound Care With max Assistance. Outcome: Progressing   Problem: RH SAFETY Goal: RH STG ADHERE TO SAFETY PRECAUTIONS W/ASSISTANCE/DEVICE Description: STG Adhere to Safety Precautions With mod Assistance/Device. Outcome: Progressing Goal: RH STG DECREASED RISK OF FALL WITH ASSISTANCE Description: STG Decreased Risk of Fall With mod Assistance. Outcome: Progressing   Problem: RH COGNITION-NURSING Goal: RH STG USES MEMORY AIDS/STRATEGIES W/ASSIST TO PROBLEM SOLVE Description: STG Uses Memory Aids/Strategies With mod Assistance to Problem Solve. Outcome: Progressing Goal: RH STG ANTICIPATES NEEDS/CALLS FOR ASSIST W/ASSIST/CUES Description: STG Anticipates Needs/Calls for Assist With mod Assistance/Cues. Outcome: Progressing   Problem: RH KNOWLEDGE DEFICIT Goal: RH STG INCREASE KNOWLEDGE OF DIABETES Description: Patient will  be able to manage Diabetes and identify signs of hypo/hperglycemia while utilizing educational material provided with mod assist  Outcome: Progressing Goal: RH STG INCREASE KNOWLEDGE OF HYPERTENSION Description: Patient will be able to manage high blood pressure while utilizing educational material provided with min assist Outcome: Progressing Goal: RH STG INCREASE KNOWLEDGE OF DYSPHAGIA/FLUID INTAKE Description: Patient will be able to manage fluid management and swallowing techniques while utilizing educational material provided with min assist  Outcome: Progressing Goal: RH STG INCREASE KNOWLEGDE OF HYPERLIPIDEMIA Description: Patient will be able to manage high cholesterol while utilizing educational instructions provided with min assist  Outcome: Progressing Goal: RH STG INCREASE KNOWLEDGE OF STROKE PROPHYLAXIS Description: Patient will be able identify medications for stroke prophylaxis while utilizing educational material provided with min assist  Outcome: Progressing   

## 2020-08-05 NOTE — Progress Notes (Signed)
Physical Therapy Session Note  Patient Details  Name: Lori Bridges MRN: [id number] Date of Birth: 1930-03-11  Today's Date: 08/05/2020 PT Individual Time: 1302-1400 PT Individual Time Calculation (min): 58 min   Short Term Goals: Week 2:  PT Short Term Goal 1 (Week 2): Pt will perform bed mobility with modA +1 PT Short Term Goal 2 (Week 2): Pt will maintain sitting EOB unsupported for >2 minute with supervision PT Short Term Goal 3 (Week 2): Pt will perform sit<>stand transfer with modA and LRAD PT Short Term Goal 4 (Week 2): Pt will perform bed<>chair transfer with modA and LRAD PT Short Term Goal 5 (Week 2): Pt will initiate pre-gait and gait training with LRAD and maxA.  Skilled Therapeutic Interventions/Progress Updates:     Patient in bed with her daughter at bedside upon PT arrival. Patient alert and agreeable to PT session. Patient denied pain during session. Noted R UE kinesiotape coming off her R hand. Her daughter reports that this tape has been on for several days. Removed tape with warm wet washcloth, required increased time due to adhesive and to maintain skin integrity. Washed patient's R arm and hand with soap and water to remove adhesive after. Noted mild redness of her skin after that resolved during session.   Patient with increased R UE activation today per family report, noted volitional movement of elbow flexion/extension, pronation, supination, shoulder flexion through minimal range against graving, and fait grip strength. Patient presents with flat affect and minimal verbalizations throughout session and strong L bias. Educated patient's daughter on encouraging vocalization and R side attention outside of therapy sessions.    Therapeutic Activity: Bed Mobility: Patient performed supine to/from sit with mod A +1/2. Provided verbal cues for rolling R using L foot and arm to assist with rolling, pushing up to her R elbow then hand to come to sitting with use of L hand on  bed rail, and reverse of coming to her R elbow from sitting for trunk control to come to lying, and brining knees to chest to lift LEs onto the bed. Transfers: Patient performed lateral scoot transfers bed<>TIS w/c with mod-min A +2. Provided cues for hand placement and head-hips relationship for proper technique and decreased assist with transfers. Patient performed sit to/from stand x2 in the // bars and x1 with RW with min-mod A of 1 person and a second SBA for safety. Provided verbal cues and manual facilitation for foot placement, hand placement requiring HOH assist, and forward weight shift.  Gait Training:  Patient ambulated 9-10 feet x1 in // bars and x1 with RW with min-mod A +1/2 with close w/c follow for safety. Ambulated with step-to gait pattern leading with R, significantly decreased gait speed and initiation of R LE advancement, increased R knee flexion in stance without buckling today, and posterior bias with L trunk rotation. Provided a mirror in front of her for visual feedback and multimodal cues for sequencing, looking ahead, R LE advancement, trunk alignment/midline orientation, and manual facilitation for stabilizing and advancing RW.  Wheelchair Mobility:  Patient was transported in the [place] w/c with total A throughout session for energy conservation and time management.  Neuromuscular Re-ed: Patient performed the following sitting balance and standing balance activities for improved midline orientation and trunk control: -sitting balance EOB ~4 min with close supervision, focused on midline orientation with gaze and trunk at midline mirror therapist, then performed head turns to the R x4, donned shoes with total A without increased retropulsion with  LE extension with cues for forward weight shift -standing balance, initially with posterior bias and significant L trunk and head rotation, performed head turn to the R and aligning shoulder with visual target on her R to find midline  for both forward and R weight shift, blocked R knee due to increased flexion in standing, once in midline progressed to attention to R quad extension in standing x3 before patient self-initiated sitting  Patient in bed with her daughter at bedside at end of session with breaks locked, bed alarm set, and all needs within reach.    Therapy Documentation Precautions:  Precautions Precautions: Fall Restrictions Weight Bearing Restrictions: No Other Position/Activity Restrictions: NPO with NGT. B soft rest restraints.    Therapy/Group: Individual Therapy  Keiden Deskin L Jolita Haefner PT, DPT  08/05/2020, 4:32 PM

## 2020-08-05 NOTE — Progress Notes (Signed)
Occupational Therapy Session Note  Patient Details  Name: Lori Bridges MRN: [id number] Date of Birth: [birth year]  Today's Date: 08/05/2020 OT Individual Time: 0900-1014 OT Individual Time Calculation (min): 74 min   Skilled Therapeutic Interventions/Progress Updates:    Pt greeted in bed with dtr Mariann Laster at bedside. Pt receiving medicine from RN, denying pain and agreeable to engage in bathing/dressing tasks during session. When asked if her brief was clean, pt responded "I don't know." Started with pericare bedlevel, Mariann Laster having hands on caregiver training with hygiene and brief change given instruction and assistance from OT. Pt was incontinent of bowel. Applied barrier cream before brief was donned for skin integrity. Pt able to lift each LE against gravity with vcs for washing/dressing LB today. Supine<sit completed with +2 assist. While EOB, worked on static and dynamic sitting balance during UB self care completion. Pt able to turn head towards the Rt side to find wash cloth/grooming items with vcs. Note active movement of Rt UE, washing her Lt side with supervision assist, pushing herself up with Rt hand to achieve neutral midline when she leaned laterally towards the Rt. Close supervision-CGA for balance 50% of the time, Mod A when fatigued with pt exhibiting posterior-Rt lean. She was able to correct sitting balance with vcs, and even initiated balance corrections on her own during session! Pt actively washing UB with vcs and assisting with donning shirt. +2 for slideboard<TIS with pt following instruction in regards to forward weight shifting and positioning of hands. She engaged in handwashing while sitting at the sink, TIS placed in neutral position. Discussed pressure relief with daughter Mariann Laster, instructing her to change tilt of TIS every 30 minutes. Mariann Laster had hands on practice changing tilt of TIS during session. Discussed importance of having safety belt secured and locking w/c before  adjusting tilt. She verbalized understanding and exhibited carryover of education. At end of session pt remained reclined in TIS for pressure relief and safety, B wrist restraints donned. Daughter was very pleased regarding pts therapeutic progress at this date.   Therapy Documentation Precautions:  Precautions Precautions: Fall Restrictions Weight Bearing Restrictions: No Other Position/Activity Restrictions: NPO with NGT. B soft rest restraints. ADL:     Therapy/Group: Individual Therapy  Shaurya Rawdon A Dianna Deshler 08/05/2020, 12:18 PM

## 2020-08-06 ENCOUNTER — Inpatient Hospital Stay (HOSPITAL_COMMUNITY): Payer: Medicare Other

## 2020-08-06 ENCOUNTER — Inpatient Hospital Stay (HOSPITAL_COMMUNITY): Payer: Medicare Other | Admitting: Physical Therapy

## 2020-08-06 ENCOUNTER — Inpatient Hospital Stay (HOSPITAL_COMMUNITY): Payer: Medicare Other | Admitting: Speech Pathology

## 2020-08-06 DIAGNOSIS — G479 Sleep disorder, unspecified: Secondary | ICD-10-CM

## 2020-08-06 LAB — BASIC METABOLIC PANEL
Anion gap: 7 (ref 5–15)
BUN: 46 mg/dL — ABNORMAL HIGH (ref 8–23)
CO2: 25 mmol/L (ref 22–32)
Calcium: 10.2 mg/dL (ref 8.9–10.3)
Chloride: 101 mmol/L (ref 98–111)
Creatinine, Ser: 1.04 mg/dL — ABNORMAL HIGH (ref 0.44–1.00)
GFR calc Af Amer: 56 mL/min — ABNORMAL LOW (ref >60)
GFR calc non Af Amer: 48 mL/min — ABNORMAL LOW (ref >60)
Glucose, Bld: 104 mg/dL — ABNORMAL HIGH (ref 70–99)
Potassium: 4.9 mmol/L (ref 3.5–5.1)
Sodium: 133 mmol/L — ABNORMAL LOW (ref 135–145)

## 2020-08-06 NOTE — Progress Notes (Signed)
[place] PHYSICAL MEDICINE & REHABILITATION PROGRESS NOTE  Subjective/Complaints: Patient seen sitting up in bed this morning, working with therapies.  Daughter at bedside.  Patient states she slept well overnight.  She is more interactive and awake this morning.  ROS: Patient denies CP, shortness of breath, nausea, vomiting, diarrhea.  Objective: Vital Signs: Blood pressure 101/65, pulse 82, temperature 98.6 F (37 C), resp. rate 18, height 5\' 6"  (1.676 m), weight 91 kg, SpO2 96 %. No results found. No results for input(s): WBC, HGB, HCT, PLT in the last 72 hours. Recent Labs    08/05/20 0345 08/06/20 0504  NA 132* 133*  K 5.5* 4.9  CL 100 101  CO2 24 25  GLUCOSE 105* 104*  BUN 47* 46*  CREATININE 1.05* 1.04*  CALCIUM 10.1 10.2    Physical Exam: BP 101/65   Pulse 82   Temp 98.6 F (37 C)   Resp 18   Ht 5\' 6"  (1.676 m)   Wt 91 kg   SpO2 96%   BMI 32.38 kg/m  Constitutional: No distress . Vital signs reviewed. HENT: Normocephalic.  Atraumatic. +NG Eyes: EOMI. No discharge. Cardiovascular: No JVD.  Irregularly irregular. Respiratory: Normal effort.  No stridor.  Bilateral clear to auscultation.   GI: Non-distended.  BS +. Skin: Warm and dry.  Intact. Psych: Slowed.  Delayed.  Flat. Musc: No edema in extremities.  No tenderness in extremities. Neuro: Alert and oriented x2 Motor: Somewhat limited due to participation, but appears to be >4/5 throughout  Assessment/Plan: 1. 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  Physiatrist is providing close team supervision and 24 hour management of active medical problems listed below.  Physiatrist and rehab team continue to assess barriers to discharge/monitor patient progress toward functional and medical  goals  Care Tool:  Bathing  Bathing activity did not occur: Safety/medical concerns (did not occur on eval, as pt was not able to be aroused awake) Body parts bathed by patient: Right arm, Left arm, Chest, Abdomen, Face   Body parts bathed by helper: Front perineal area, Buttocks, Right upper leg, Left upper leg, Right lower leg, Left lower leg Body parts n/a: Front perineal area, Buttocks, Right upper leg, Left upper leg, Right lower leg, Left lower leg   Bathing assist Assist Level: Maximal Assistance - Patient 24 - 49%     Upper Body Dressing/Undressing Upper body dressing Upper body dressing/undressing activity did not occur (including orthotics):  (did not occur on eval, as pt was not able to be aroused awake) What is the patient wearing?: Button up shirt    Upper body assist Assist Level: Maximal Assistance - Patient 25 - 49%    Lower Body Dressing/Undressing Lower body dressing      What is the patient wearing?: Incontinence brief, Pants     Lower body assist Assist for lower body dressing: Total Assistance - Patient < 25%     Toileting Toileting    Toileting assist Assist for toileting: 2 Helpers     Transfers Chair/bed transfer  Transfers assist  Chair/bed transfer activity did not occur: Safety/medical concerns  Chair/bed transfer assist level: Maximal Assistance - Patient 25 - 49% (assist of 2nd person standby.)     Locomotion Ambulation   Ambulation assist   Ambulation activity did not occur: Safety/medical concerns  Assist level:  Total Assistance - Patient < 25% Assistive device: Parallel bars Max distance: 3   Walk 10 feet activity   Assist  Walk 10 feet activity did not occur: Safety/medical concerns        Walk 50 feet activity   Assist Walk 50 feet with 2 turns activity did not occur: Safety/medical concerns         Walk 150 feet activity   Assist Walk 150 feet activity did not occur: Safety/medical concerns          Walk 10 feet on uneven surface  activity   Assist Walk 10 feet on uneven surfaces activity did not occur: Safety/medical concerns         Wheelchair     Assist Will patient use wheelchair at discharge?: Yes Type of Wheelchair: Manual Wheelchair activity did not occur: Safety/medical concerns         Wheelchair 50 feet with 2 turns activity    Assist    Wheelchair 50 feet with 2 turns activity did not occur: Safety/medical concerns       Wheelchair 150 feet activity     Assist Wheelchair 150 feet activity did not occur: Safety/medical concerns          Medical Problem List and Plan: 1.    Left spastic hemiparesis, global aphasia secondary to small left frontal medial ICH and left temporal occipital ICH in the setting of chronic Coumadin/likely hypertensive  Continue CIR  WHO/PRAFO nightly 2.  Antithrombotics: -DVT/anticoagulation: Subcutaneous heparin for DVT prophylaxis initiated July 25, 2020             -antiplatelet therapy: N/A 3. Pain Management: Tylenol as needed  Controlled on 8/9 4. Mood: D/c Ativan due to lethargy.              -antipsychotic agents: N/A 5. Neuropsych: This patient is not capable of making decisions on her own behalf.  Continue wrist restraints for safety 6. Skin/Wound Care: Routine skin checks 7. Fluids/Electrolytes/Nutrition: Routine in and outs. 8.  Seizure prophylaxis.    Keppra 1000 mg twice daily.  EEG negative  Plan to wean this week 9.  Atrial fibrillation.  Chronic Coumadin reversed.  Cardiac rate controlled. Continue Lopressor 75 mg twice daily  Rate controlled on 8/9 10. Post stroke dysphagia.  D1 thins   D/c nasogastric tube feeds.    Dietary follow-up  Advance diet as tolerated 11.  Hypertension.  Lasix 20 mg daily, Avapro 150 mg daily, Toprol XL 100 mg daily currently on hold.    Continue hydralazine 25 mg twice daily,   Continue Lopressor 75 BID  Resume as needed  Controlled 12.  Hypothyroidism.   Synthroid 13.  Hyperlipidemia.  Statin on hold in the setting of ICH 14.  UTI/E. coli.  Completed course of Rocephin 15.  GERD.  Continue Protonix  16.  Hyponatremia  Sodium 133 on 8/9 17.  Thrombocytopenia: Resolved  Platelets 158 on 8/6 18.  Leukopenia  Resolved 19.  Hyperkalemia  Potassium 4.9 on 8/9, labs ordered for tomorrow  [facility] ordered x3 20.  CKD III  Creatinine 1.04 on 8/9 21.?  Sleep disturbance-per daughter  Melatonin ordered  Improving  LOS: 11 days A FACE TO FACE EVALUATION WAS PERFORMED  Saralynn Langhorst [name] 08/06/2020, 12:51 PM

## 2020-08-06 NOTE — Progress Notes (Signed)
Speech Language Pathology Daily Session Note  Patient Details  Name: Lori Bridges MRN: [id number] Date of Birth: [birth year]  Today's Date: 08/06/2020 SLP Individual Time: 0730-0800 SLP Individual Time Calculation (min): 30 min  Short Term Goals: Week 2: SLP Short Term Goal 1 (Week 2): Pt will demonstrate efficient mastication and oral clearance with dysphagia 2 texture trials across 2 sessions prior to advancement. SLP Short Term Goal 2 (Week 2): Pt will sustain her attention to basic, familiar tasks for 5 minute intervals with mod assist multimodal cues. SLP Short Term Goal 3 (Week 2): Pt will complete basic, familiar tasks with max assist for functional problem solving. SLP Short Term Goal 4 (Week 2): Pt will recall functional information relating to therapy sessions with Max A SLP Short Term Goal 5 (Week 2): Pt will initiate during tasks with max A multimodal cueing.  Skilled Therapeutic Interventions:Skilled SLP intervention focused on dysphagia. Pt seen with dysphagia 2 trial tray and thin liquids. Pt was total assist with feeding at beginning of meal but eventually fed self with Min A tactile cues with SLP assisting pt place food on spoon. Prolonged mastication demonstrated with dysphagia 2 sausage and pureed pancake however adequate with min to no residue present in oral cavity. Family reports prolonged mastication is patients baseline. Pt tolerated dys 2 trials and thin liquids via straw with no overt s/sx of aspiration or penetration. Recommend pt continue with dys 2 diet and thin liquids. Daughter informed of swallow precautions and diet recommendation. RN informed of plan to upgrade diet to dys 2 and to continue with thin liquids. Pt consumed 50% of meal with SLP present. Daughter given menu to fill out to ensure pt receives food preferences. Pt left upright in bed with call bell within reach,bed alarm set and family present. Cont with therapy per plan of care.      Pain Pain  Assessment Pain Scale: Faces Faces Pain Scale: No hurt  Therapy/Group: Individual Therapy  Darrol Poke Alexxander Kurt 08/06/2020, 11:54 AM

## 2020-08-06 NOTE — Progress Notes (Signed)
Occupational Therapy Session Note  Patient Details  Name: Lori Bridges MRN: [id number] Date of Birth: [birth year]  Today's Date: 08/06/2020 OT Individual Time: 3643-8377 OT Individual Time Calculation (min): 50 min    Short Term Goals: Week 2:  OT Short Term Goal 1 (Week 2): Pt will demonstrate improved attention and initiation to bathe UB with mod A. OT Short Term Goal 2 (Week 2): Pt will demonstrate improved attention to R side to doff/ don shirt over R arm with mod A. OT Short Term Goal 3 (Week 2): when in stedy lift, pt will be able to achieve upright stand with mod A of 1 during LB cleansing/dressing. OT Short Term Goal 4 (Week 2): Pt will demonstrate improved attention to R arm with performing self ROM with mod cues and  min A.  Skilled Therapeutic Interventions/Progress Updates:    Pt received supine with daughter present, no c/o pain. Pt agreeable to completing ADLs this session. Pt completed anterior peri hygiene supine with verbal and tactile cues required to initiate. Pt rolled to the R with min A and to the L with mod A throughout session. Mod A provided for posterior peri hygiene. Pt required max A to don new brief at bed level. Total A to don pants supine, pt attempting to bridge and able to lift her L hip slightly off of bed. Pt transitioned supine to sitting EOB with mod facilitation overall and max A. Once EOB pt was able to maintain sitting balance statically with CGA. Extra time required throughout session for initiation. Pt completed sit > stand from EOB with mod +2 assist. She required increased time and max +2 assist to stand pivot to the bsc. Total A for clothing management in standing. Pt completed toileting, voiding urine on BSC. Pt stood and required max A for clothing management in standing, removing RUE from RW to assist with pulling up pants. Pt held onto RW with BUE and required mod A to remain standing, BSC was removed and TIS placed behind her. Pt sat in TIS and  completed oral care at the sink with min cueing and set up assist. Discussed with pt's daughter and nursing staff need for pt to not sit up for more than an hour. Pt was left sitting up with all needs met, chair alarm set.   Therapy Documentation Precautions:  Precautions Precautions: Fall Restrictions Weight Bearing Restrictions: No Other Position/Activity Restrictions: NPO with NGT. B soft rest restraints.   Therapy/Group: Individual Therapy  Curtis Sites 08/06/2020, 6:32 AM

## 2020-08-06 NOTE — Progress Notes (Signed)
Physical Therapy Session Note  Patient Details  Name: Lori Bridges MRN: [id number] Date of Birth: [birth year]  Today's Date: 08/06/2020 PT Individual Time: 5170-0174 PT Individual Time Calculation (min): 54 min   Short Term Goals: Week 2:  PT Short Term Goal 1 (Week 2): Pt will perform bed mobility with modA +1 PT Short Term Goal 2 (Week 2): Pt will maintain sitting EOB unsupported for >2 minute with supervision PT Short Term Goal 3 (Week 2): Pt will perform sit<>stand transfer with modA and LRAD PT Short Term Goal 4 (Week 2): Pt will perform bed<>chair transfer with modA and LRAD PT Short Term Goal 5 (Week 2): Pt will initiate pre-gait and gait training with LRAD and maxA.  Skilled Therapeutic Interventions/Progress Updates:    Pt sitting on toilet with +2 nursing staff, daughter at bedside. Nursing staff performed [employer] transfer from toilet to [place] w/c and therapist facilitated w/c positioning at the hips and shoulders. Spoke with daughter regarding progress of building a ramp at the home pt will be [place] to and daughter reports that plans are being made. Pt transported to therapy gym with totalA for time and energy conservation. Pt performed sit<>stand transfer with mod/maxA +1 from TIS to RW, requiring verbal/visual/tactile cues for RUE placement to grip. Once standing, pt required multi-modal cueing for upright posture and forward lean. Mirror placed in front of pt for visual aid for midline orientation. Pt ambulated 21ft + 59ft (seated rest) with mod/maxA+1 and RW with close w/c follow for safety. Required continuous multi-modal cueing for forward gaze, increasing B step length, assist for RW management as pt veers R, and cues for improving proximity to AD. After 2nd ambulation, pt reports fatigue and requests to return to her room. With effort, pt was redirected and performed squat-pivot transfer from TIS to mat table with modA. Once short-sitting at end of mat table, mirror placed again in pt  to assist with upright/midline orientation which appeared to improve however pt becoming frustrated with cues and efforts. Attempted to redirect pt to perform seated horse-shoe toss focusing on target reaching with RUE to grab horseshoes towards her R side where pt was able to complete x4 throws before adamantly requesting to return to her room. Pt performed stand-pivot transfer with mod/maxA back to TIS, returned to room and again performed stand-pivot with mod/maxA to her bed, sit<>supine with maxA. Educated pt and daughter the importance of continued participation throughout sessions to maximize mobility gains during her rehabilitation, daughter encouraged pt and pt verbalized understanding. Bed lowered, bed alarm on, needs in reach.  Therapy Documentation Precautions:  Precautions Precautions: Fall Restrictions Weight Bearing Restrictions: No Other Position/Activity Restrictions: NPO with NGT. B soft rest restraints. Vital Signs: Therapy Vitals Temp: 98.1 F (36.7 C) Pulse Rate: (!) 102 Resp: 15 BP: 129/81 Patient Position (if appropriate): Sitting Oxygen Therapy SpO2: 96 % O2 Device: Room Air Pain: Pain Assessment Pain Scale: Faces Faces Pain Scale: No hurt   Therapy/Group: Individual Therapy  Yoon Barca P Derrico Zhong PT 08/06/2020, 3:31 PM

## 2020-08-06 NOTE — Progress Notes (Signed)
Team Conference Report to Patient/Family  Team Conference discussion was reviewed with the patient and daughter [name]) caregiver, including goals, any changes in plan of care and target discharge date.  Patient's caregiver expressed understanding and is in agreement.  The patient has a target discharge date of 08/22/20. Family education can be completed any day with the daughters as they are here in the morning and rotate in the afternoon shifts. Also provided family with information about ramp installation and ramp rentals for the daughter's home.   Dorien Chihuahua B 08/06/2020, 9:18 AM

## 2020-08-07 ENCOUNTER — Inpatient Hospital Stay (HOSPITAL_COMMUNITY): Payer: Medicare Other | Admitting: Occupational Therapy

## 2020-08-07 ENCOUNTER — Inpatient Hospital Stay (HOSPITAL_COMMUNITY): Payer: Medicare Other

## 2020-08-07 ENCOUNTER — Inpatient Hospital Stay (HOSPITAL_COMMUNITY): Payer: Medicare Other | Admitting: Speech Pathology

## 2020-08-07 DIAGNOSIS — N1831 Chronic kidney disease, stage 3a: Secondary | ICD-10-CM

## 2020-08-07 LAB — BASIC METABOLIC PANEL
Anion gap: 10 (ref 5–15)
BUN: 41 mg/dL — ABNORMAL HIGH (ref 8–23)
CO2: 24 mmol/L (ref 22–32)
Calcium: 10.4 mg/dL — ABNORMAL HIGH (ref 8.9–10.3)
Chloride: 102 mmol/L (ref 98–111)
Creatinine, Ser: 1.01 mg/dL — ABNORMAL HIGH (ref 0.44–1.00)
GFR calc Af Amer: 58 mL/min — ABNORMAL LOW (ref >60)
GFR calc non Af Amer: 50 mL/min — ABNORMAL LOW (ref >60)
Glucose, Bld: 86 mg/dL (ref 70–99)
Potassium: 4.4 mmol/L (ref 3.5–5.1)
Sodium: 136 mmol/L (ref 135–145)

## 2020-08-07 MED ORDER — LEVETIRACETAM 100 MG/ML PO SOLN
500.0000 mg | Freq: Two times a day (BID) | ORAL | Status: DC
  Administered 2020-08-07 – 2020-08-14 (×14): 500 mg via ORAL
  Filled 2020-08-07 (×14): qty 5

## 2020-08-07 NOTE — Progress Notes (Signed)
Physical Therapy Session Note  Patient Details  Name: Lori Bridges MRN: [id number] Date of Birth: Dec 22, 1930  Today's Date: 08/07/2020 PT Individual Time: 0915-1010 PT Individual Time Calculation (min): 55 min   Short Term Goals: Week 2:  PT Short Term Goal 1 (Week 2): Pt will perform bed mobility with modA +1 PT Short Term Goal 2 (Week 2): Pt will maintain sitting EOB unsupported for >2 minute with supervision PT Short Term Goal 3 (Week 2): Pt will perform sit<>stand transfer with modA and LRAD PT Short Term Goal 4 (Week 2): Pt will perform bed<>chair transfer with modA and LRAD PT Short Term Goal 5 (Week 2): Pt will initiate pre-gait and gait training with LRAD and maxA.  Skilled Therapeutic Interventions/Progress Updates:    Pt received supine in bed, daughter at bedside, agreeable to PT session. Pt denies pain. Focus of session to continue gait training and strengthening activity tolerance. TED Hose and shoes donned with totalA while pt supine for time management purposes. Pt performed rolling R with HOB flat and HR use, requiring minA hip facilitation. Once sidelying, pt able to manage BLE's off EOB with TC/VC and required min/modA for trunk to upright. Pt then able to perform stand-pivot transfer with modA from EOB to TIS, requiring cues for sequencing and technique. Pt transported to hallway with [place] for time management and energy conservation purposes. Daughter joined in hallways to assist with motivating and encouraging patient to participate which helped greatly. Pt performed sit<>stand transfers from TIS to [place] with modA, initially demo's strong posterior lean which can be corrected with multi-modal cueing. Mirror placed in front of mirror to assist with visual feedback. Pt ambulated a total of 65ft + 36ft + 65ft (seated rest breaks) with RW and modA with w/c follow for safety. Pt requiring modA 2/2 posterior and R lateral lean as well as for RW management as pt veers R. Daughter  present and she was very excited to see pt's progress. Pt then taken to therapy gym where she performed a peg board puzzle while standing with RW support and modA due to R lateral/posterior lean. Focusing on using RUE to reach for peg's and placing them on the R side of the board in order to facilitate R gaze and attention. Pt able to complete ~10pegs prior to fatigue. Pt returned to room in TIS, performed stand-pivot with modA to bed, and sit<>supine with modA for RLE and trunk management via reverse log roll. Pt ended session with daughter at bedside, needs in reach, bed alarm on, bed rails up.  Therapy Documentation Precautions:  Precautions Precautions: Fall Restrictions Weight Bearing Restrictions: No Other Position/Activity Restrictions: NPO with NGT. B soft rest restraints.  Therapy/Group: Individual Therapy  Sharica Roedel P Christina Waldrop PT 08/07/2020, 10:25 AM

## 2020-08-07 NOTE — Progress Notes (Signed)
Occupational Therapy Session Note  Patient Details  Name: Lori Bridges MRN: [id number] Date of Birth: 01/26/1930  Today's Date: 08/07/2020 OT Individual Time: 1417-1500 OT Individual Time Calculation (min): 43 min    Short Term Goals: Week 2:  OT Short Term Goal 1 (Week 2): Pt will demonstrate improved attention and initiation to bathe UB with mod A. OT Short Term Goal 2 (Week 2): Pt will demonstrate improved attention to R side to doff/ don shirt over R arm with mod A. OT Short Term Goal 3 (Week 2): when in stedy lift, pt will be able to achieve upright stand with mod A of 1 during LB cleansing/dressing. OT Short Term Goal 4 (Week 2): Pt will demonstrate improved attention to R arm with performing self ROM with mod cues and  min A.  Skilled Therapeutic Interventions/Progress Updates:    Patient in bed, daughter present for session.   Reports that patient had an episode of difficulty breathing prior to this session.  Patient currently resting comfortably, denies SOB or difficulty breathing at this time.  O2 sat 96%.  Supine to sitting edge of bed with min A.  She is able to maintain unsupported sitting with CS and occ cues for posture for 20 minutes with trunk mobility, LB/UB AROM activities, visual scanning.  Sit to stand at edge of bed and ambulation with RW 15 feet x 2 to TIS w/c min A for walker mgmt and posture, stand by of 2nd person.  Sitting to supine with min A.  She remained in bed at close of session, no SOB, states that she is tired, bed alarm set and call bell in reach.    Therapy Documentation Precautions:  Precautions Precautions: Fall Restrictions Weight Bearing Restrictions: No Other Position/Activity Restrictions: NPO with NGT. B soft rest restraints.  Therapy/Group: Individual Therapy  Carlos Levering 08/07/2020, 7:42 AM

## 2020-08-07 NOTE — Plan of Care (Signed)
  Problem: RH BOWEL ELIMINATION Goal: RH STG MANAGE BOWEL WITH ASSISTANCE Description: STG Manage Bowel with max Assistance. Outcome: Progressing Goal: RH STG MANAGE BOWEL W/MEDICATION W/ASSISTANCE Description: STG Manage Bowel with Medication with max Assistance. Outcome: Progressing   Problem: RH BLADDER ELIMINATION Goal: RH STG MANAGE BLADDER WITH ASSISTANCE Description: STG Manage Bladder With max Assistance Outcome: Progressing Goal: RH STG MANAGE BLADDER WITH MEDICATION WITH ASSISTANCE Description: STG Manage Bladder With Medication With max Assistance. Outcome: Progressing Goal: RH STG MANAGE BLADDER WITH EQUIPMENT WITH ASSISTANCE Description: STG Manage Bladder With Equipment With max Assistance Outcome: Progressing   Problem: RH SKIN INTEGRITY Goal: RH STG SKIN FREE OF INFECTION/BREAKDOWN Outcome: Progressing Goal: RH STG MAINTAIN SKIN INTEGRITY WITH ASSISTANCE Description: STG Maintain Skin Integrity With max Assistance. Outcome: Progressing Goal: RH STG ABLE TO PERFORM INCISION/WOUND CARE W/ASSISTANCE Description: STG Able To Perform Incision/Wound Care With max Assistance. Outcome: Progressing   Problem: RH SAFETY Goal: RH STG ADHERE TO SAFETY PRECAUTIONS W/ASSISTANCE/DEVICE Description: STG Adhere to Safety Precautions With mod Assistance/Device. Outcome: Progressing Goal: RH STG DECREASED RISK OF FALL WITH ASSISTANCE Description: STG Decreased Risk of Fall With mod Assistance. Outcome: Progressing   Problem: RH COGNITION-NURSING Goal: RH STG USES MEMORY AIDS/STRATEGIES W/ASSIST TO PROBLEM SOLVE Description: STG Uses Memory Aids/Strategies With mod Assistance to Problem Solve. Outcome: Progressing Goal: RH STG ANTICIPATES NEEDS/CALLS FOR ASSIST W/ASSIST/CUES Description: STG Anticipates Needs/Calls for Assist With mod Assistance/Cues. Outcome: Progressing   Problem: RH KNOWLEDGE DEFICIT Goal: RH STG INCREASE KNOWLEDGE OF DIABETES Description: Patient will  be able to manage Diabetes and identify signs of hypo/hperglycemia while utilizing educational material provided with mod assist  Outcome: Progressing Goal: RH STG INCREASE KNOWLEDGE OF HYPERTENSION Description: Patient will be able to manage high blood pressure while utilizing educational material provided with min assist Outcome: Progressing Goal: RH STG INCREASE KNOWLEDGE OF DYSPHAGIA/FLUID INTAKE Description: Patient will be able to manage fluid management and swallowing techniques while utilizing educational material provided with min assist  Outcome: Progressing Goal: RH STG INCREASE KNOWLEGDE OF HYPERLIPIDEMIA Description: Patient will be able to manage high cholesterol while utilizing educational instructions provided with min assist  Outcome: Progressing Goal: RH STG INCREASE KNOWLEDGE OF STROKE PROPHYLAXIS Description: Patient will be able identify medications for stroke prophylaxis while utilizing educational material provided with min assist  Outcome: Progressing   

## 2020-08-07 NOTE — Progress Notes (Signed)
Speech Language Pathology Daily Session Note  Patient Details  Name: Lori Bridges MRN: [id number] Date of Birth: [birth year]  Today's Date: 08/07/2020 SLP Individual Time: 1030-1100 SLP Individual Time Calculation (min): 30 min  Short Term Goals: Week 2: SLP Short Term Goal 1 (Week 2): Pt will demonstrate efficient mastication and oral clearance with dysphagia 2 texture trials across 2 sessions prior to advancement. SLP Short Term Goal 2 (Week 2): Pt will sustain her attention to basic, familiar tasks for 5 minute intervals with mod assist multimodal cues. SLP Short Term Goal 3 (Week 2): Pt will complete basic, familiar tasks with max assist for functional problem solving. SLP Short Term Goal 4 (Week 2): Pt will recall functional information relating to therapy sessions with Max A SLP Short Term Goal 5 (Week 2): Pt will initiate during tasks with max A multimodal cueing.  Skilled Therapeutic Interventions: Skilled SLP treatment focused on cognition. Pt recalled tasks completed in PT session prior to this session with max A verbal cues. Increased responses to questions in context noted this session however pt continues to require cues with initiation in conversation. Pt sustained attention during conversational task using family photo and was able to name family members with 90% accuracy and Min A for verbal initiation and additional time.  Pt sustained attention with min A verbal cues for 6-7 minute calendar task. Mod A with visual cues needed with scanning to far right visual field. Pt left in bed with call bell within reach, chair alarm set and family present at bedside. Cont with therapy per plan of care.      Pain Pain Assessment Pain Scale: Faces Faces Pain Scale: No hurt  Therapy/Group: Individual Therapy  Gregary Signs A Lori Bridges 08/07/2020, 11:19 AM

## 2020-08-07 NOTE — Progress Notes (Signed)
Nutrition Follow-up  DOCUMENTATION CODES:   Not applicable  INTERVENTION:   - Continue Ensure Enlive po BID, each supplement provides 350 kcal and 20 grams of protein  - Magic cup TID with meals, each supplement provides 290 kcal and 9 grams of protein  - Changed pt to "with assist" for meal ordering  NUTRITION DIAGNOSIS:   Inadequate oral intake related to inability to eat as evidenced by NPO status.  Progressing, pt now on Dysphagia 2 diet  GOAL:   Patient will meet greater than or equal to 90% of their needs  Progressing  MONITOR:   PO intake, Supplement acceptance, Diet advancement, Labs, Weight trends, Skin  REASON FOR ASSESSMENT:   Consult Enteral/tube feeding initiation and management  ASSESSMENT:   Lori Bridges is an 86 y.o. female with history of atrial fibrillation on coumadin, hypertension, hyperlipidemia, hypothyroidism and chronic kidney disease. Admitted with [facility].  7/29 -Cortrak tube removed 7/30 - NGT placed at bedside,placement confirmed by KUB, later replaced with Cortrak 8/04 - pt pulled Cortrak,Cortrak replaced (tip gastricper x-ray), diet advanced to dysphagia 1 with thin liquids 8/05 - transitioned to nocturnal tube feeds 8/07 - nocturnal tube feeding rate reduced 8/09 - diet advanced to dysphagia 2 with thin liquids, nocturnal TF d/c and Cortrak removed  Noted target d/c date of 8/25.  Admit weight: 79.2 kg Current weight: 89.6 kg  Spoke with pt's daughter at bedside. [name] reports pt is consuming 1-2 Ensure Enlive supplements daily. Pt really enjoys the orange [employer]. RD will order TID to come with meals. [name] also reports pt is occasionally being sent food that she does not eat. Will transition pt to "With Assist" so that she will be able to order accepted foods.  Meal Completion: 15-100% (variable)  Medications reviewed and include: Ensure Enlive BID, protonix, senna  Labs reviewed.   NUTRITION - FOCUSED  PHYSICAL EXAM:  Unable to complete at this time. Pt using bedpan.  Diet Order:   Diet Order            DIET DYS 2 Room service appropriate? Yes; Fluid consistency: Thin  Diet effective 0500                 EDUCATION NEEDS:   No education needs have been identified at this time  Skin:  Skin Assessment: Skin Integrity Issues: Other: skin tear to left thigh, open wound to perineum  Last BM:  08/06/20 medium type 5  Height:   Ht Readings from Last 1 Encounters:  07/26/20 5\' 6"  (1.676 m)    Weight:   Wt Readings from Last 1 Encounters:  08/07/20 89.6 kg    Ideal Body Weight:  59.1 kg  BMI:  Body mass index is 31.88 kg/m.  Estimated Nutritional Needs:   Kcal:  1550-1750  Protein:  80-95 grams  Fluid:  > 1.5 L    Gaynell Face, MS, RD, LDN Inpatient Clinical Dietitian Please see AMiON for contact information.

## 2020-08-07 NOTE — Progress Notes (Signed)
[facility] PHYSICAL MEDICINE & REHABILITATION PROGRESS NOTE  Subjective/Complaints: Patient seen sitting up in bed this morning eating breakfast.  Daughter at bedside.  Patient states she slept well overnight.  Discussed avoiding distractions during eating.  Patient states she feels better without NG tube.  ROS: Appears to deny CP, shortness of breath, nausea, vomiting, diarrhea.  Objective: Vital Signs: Blood pressure 111/75, pulse 60, temperature 97.8 F (36.6 C), temperature source Oral, resp. rate 17, height 5\' 6"  (1.676 m), weight 89.6 kg, SpO2 95 %. No results found. No results for input(s): WBC, HGB, HCT, PLT in the last 72 hours. Recent Labs    08/06/20 0504 08/07/20 0643  NA 133* 136  K 4.9 4.4  CL 101 102  CO2 25 24  GLUCOSE 104* 86  BUN 46* 41*  CREATININE 1.04* 1.01*  CALCIUM 10.2 10.4*    Physical Exam: BP 111/75   Pulse 60   Temp 97.8 F (36.6 C) (Oral)   Resp 17   Ht 5\' 6"  (1.676 m)   Wt 89.6 kg   SpO2 95%   BMI 31.88 kg/m  Constitutional: No distress . Vital signs reviewed. HENT: Normocephalic.  Atraumatic. Eyes: EOMI. No discharge. Cardiovascular: No JVD.  Irregularly irregular. Respiratory: Normal effort.  No stridor.  Bilateral clear to auscultation. GI: Non-distended.  BS +. Skin: Warm and dry.  Intact. Psych: Slowed.  Delayed. Musc: Right-sided edema.  No tenderness in extremities. Neuro: Alert and oriented x2 Motor: Somewhat limited due to participation, but appears to be >4/5 throughout  Assessment/Plan: 1. 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  Physiatrist is providing close team supervision and 24 hour management of active medical problems listed below.  Physiatrist and rehab team continue to assess barriers to  discharge/monitor patient progress toward functional and medical goals  Care Tool:  Bathing  Bathing activity did not occur: Safety/medical concerns (did not occur on eval, as pt was not able to be aroused awake) Body parts bathed by patient: Right arm, Left arm, Chest, Abdomen, Face   Body parts bathed by helper: Front perineal area, Buttocks, Right upper leg, Left upper leg, Right lower leg, Left lower leg Body parts n/a: Front perineal area, Buttocks, Right upper leg, Left upper leg, Right lower leg, Left lower leg   Bathing assist Assist Level: Maximal Assistance - Patient 24 - 49%     Upper Body Dressing/Undressing Upper body dressing Upper body dressing/undressing activity did not occur (including orthotics):  (did not occur on eval, as pt was not able to be aroused awake) What is the patient wearing?: Button up shirt    Upper body assist Assist Level: Maximal Assistance - Patient 25 - 49%    Lower Body Dressing/Undressing Lower body dressing      What is the patient wearing?: Incontinence brief, Pants     Lower body assist Assist for lower body dressing: Total Assistance - Patient < 25%     Toileting Toileting    Toileting assist Assist for toileting: 2 Helpers     Transfers Chair/bed transfer  Transfers assist  Chair/bed transfer activity did not occur: Safety/medical concerns  Chair/bed transfer assist level: Maximal Assistance - Patient 25 - 49%     Locomotion Ambulation   Ambulation assist   Ambulation activity did not occur: Safety/medical concerns  Assist level: 2 helpers (  maxA of 1 with w/c follow) Assistive device: Walker-rolling Max distance: 3   Walk 10 feet activity   Assist  Walk 10 feet activity did not occur: Safety/medical concerns  Assist level: 2 helpers (maxA of one with w/c follow) Assistive device: Walker-rolling   Walk 50 feet activity   Assist Walk 50 feet with 2 turns activity did not occur: Safety/medical concerns          Walk 150 feet activity   Assist Walk 150 feet activity did not occur: Safety/medical concerns         Walk 10 feet on uneven surface  activity   Assist Walk 10 feet on uneven surfaces activity did not occur: Safety/medical concerns         Wheelchair     Assist Will patient use wheelchair at discharge?: Yes Type of Wheelchair: Manual Wheelchair activity did not occur: Safety/medical concerns         Wheelchair 50 feet with 2 turns activity    Assist    Wheelchair 50 feet with 2 turns activity did not occur: Safety/medical concerns       Wheelchair 150 feet activity     Assist Wheelchair 150 feet activity did not occur: Safety/medical concerns          Medical Problem List and Plan: 1.    Left spastic hemiparesis, global aphasia secondary to small left frontal medial ICH and left temporal occipital ICH in the setting of chronic Coumadin/likely hypertensive  Continue CIR  WHO/PRAFO nightly 2.  Antithrombotics: -DVT/anticoagulation: Subcutaneous heparin for DVT prophylaxis initiated July 25, 2020             -antiplatelet therapy: N/A 3. Pain Management: Tylenol as needed  Controlled on 8/10 4. Mood: D/c Ativan due to lethargy.              -antipsychotic agents: N/A 5. Neuropsych: This patient is not capable of making decisions on her own behalf.  Continue wrist restraints for safety 6. Fluids/Electrolytes/Nutrition: Routine in and outs. 8.  Seizure prophylaxis.    Keppra 1000 mg twice daily, decreased to 500 twice daily on 8/10.  EEG negative 9.  Atrial fibrillation.  Chronic Coumadin reversed.  Cardiac rate controlled. Continue Lopressor 75 mg twice daily  Rate controlled on 8/10 10. Post stroke dysphagia.  D2 thins  D/ced nasogastric tube feeds.    Dietary follow-up  Advance diet as tolerated 11.  Hypertension.  Lasix 20 mg daily, Avapro 150 mg daily, Toprol XL 100 mg daily currently on hold.    Continue hydralazine 25 mg twice  daily,   Continue Lopressor 75 BID  Resume as needed  Controlled on 8/10 12.  Hypothyroidism.  Synthroid 13.  Hyperlipidemia.  Statin on hold in the setting of ICH 14.  UTI/E. coli.  Completed course of Rocephin 15.  GERD.  Continue Protonix  16.  Hyponatremia  Sodium 136 on 8/10 17.  Thrombocytopenia: Resolved  Platelets 158 on 8/6 18.  Leukopenia  Resolved 19.  Hyperkalemia  Potassium 4.4 on 8/10  Lokelma ordered x3 previously 20.  CKD III  Creatinine 1.01 on 8/10 21.?  Sleep disturbance-per daughter  Melatonin ordered  Improving  LOS: 12 days A FACE TO FACE EVALUATION WAS PERFORMED  Derec Mozingo [name] 08/07/2020, 9:00 AM

## 2020-08-08 ENCOUNTER — Inpatient Hospital Stay (HOSPITAL_COMMUNITY): Payer: Medicare Other | Admitting: Occupational Therapy

## 2020-08-08 ENCOUNTER — Inpatient Hospital Stay (HOSPITAL_COMMUNITY): Payer: Medicare Other | Admitting: Speech Pathology

## 2020-08-08 ENCOUNTER — Inpatient Hospital Stay (HOSPITAL_COMMUNITY): Payer: Medicare Other

## 2020-08-08 NOTE — Patient Care Conference (Signed)
Inpatient RehabilitationTeam Conference and Plan of Care Update Date: 08/08/2020   Time: 11:10 AM    Patient Name: Lori Bridges      Medical Record Number: [id number]  Date of Birth: 16-Sep-1930 Sex: Female         Room/Bed: 4W06C/4W06C-01 Payor Info: Payor: MEDICARE / Plan: MEDICARE PART A AND B / Product Type: *No Product type* /    Admit Date/Time:  07/26/2020  4:25 PM  Primary Diagnosis:  ICH (intracerebral hemorrhage) [name])  Hospital Problems: Principal Problem:   ICH (intracerebral hemorrhage) (HCC) - 2 small L brain ICH, likely d/t HTN on subtherapeutic warfarin Active Problems:   Benign essential HTN   Dysphagia, post-stroke   PAF (paroxysmal atrial fibrillation) (HCC)   Seizure prophylaxis   Gastroesophageal reflux disease   Atrial fibrillation (HCC)   Thrombocytopenia (HCC)   Hypernatremia   Leukopenia   Hyperkalemia   Hyponatremia   Sleep disturbance    Expected Discharge Date: Expected Discharge Date: 08/16/20  Team Members Present: Physician leading conference: Dr. Delice Lesch Care Coodinator Present: Dorien Chihuahua, RN, BSN, CRRN;Christina Sampson Goon, [place] Nurse Present: Other (comment) Dina Rich, RN) PT Present: Other (comment) (Christain Manhard, PT) OT Present: Leretha Pol, OT SLP Present: Jettie Booze, CF-SLP PPS Coordinator present : Ileana Ladd, Burna Mortimer, SLP     Current Status/Progress Goal Weekly Team Focus  Bowel/Bladder   cont/incontinent of b&b; LBM 8/9  train b&b  assess q shift/prn   Swallow/Nutrition/ Hydration   Dysphagia 2 diet with thin liquids. NG tube removed.  Mod A  Diet advancement, D3 trials, independence with feeding and use of swallow strategies.   ADL's   mod assist +2 functional transfers; total assist to max assist for BADLs  mod to max assist LB ADLs, supervision to min assist for UB ADLs, min to mod assist functional transfers  bed mobility, sitting balance, functional transfers, ADL training, right  attention and tracking   Mobility   mod/maxA +1 bed mobility, CGA sitting EOB, mod/maxA +1 sit<>stand, gait 34ft modA +1 and w/c follow  minA bed mobility, minA transfers, gait 25ft minA, w/c mobility modA  standing balance, gait, transfers, improving activity tolerance   Communication   more verbal output,needs cues for initiation.         Safety/Cognition/ Behavioral Observations  Mod-Max with problem solving tasks.  Mod A  basic familar problem solving, task initiation, attention to functional tasks.   Pain   no c/o pain  remain pain free  assess q shift/prn   Skin   MASD to perineum  remain free of new skin breakdown/infection  assess q shift/prn     Discharge Planning:  Goal to discharge home with DTr to provide care [name]) other family to assist (4 steps to enter home)   Team Discussion: Good progression noted. Improved initiative, sequencing and almost back to baseline cognitive status. Patient on target to meet rehab goals: yes  *See Care Plan and progress notes for long and short-term goals.   Revisions to Treatment Plan:    Teaching Needs: Family education ongoing with daughters Toileting, transfers, ADL assistance, medications, nutrition  Current Barriers to Discharge: Home enviroment access/layout  Possible Resolutions to Barriers: Daughter given information on ramp installation/rental for entry to home     Medical Summary Current Status: Left spastic hemiparesis, global aphasia secondary to small left frontal medial ICH and left temporal occipital ICH in the setting of chronic Coumadin/likely hypertensive  Barriers to Discharge: Medical stability;Behavior;Nutrition means;Home enviroment access/layout  Possible Resolutions to [employer] Focus: Therapies, follow labs, optimize BP meds, advance diet as tolerated   Continued Need for Acute Rehabilitation Level of Care: The patient requires daily medical management by a physician with specialized training  in physical medicine and rehabilitation for the following reasons: Direction of a multidisciplinary physical rehabilitation program to maximize functional independence : Yes Medical management of patient stability for increased activity during participation in an intensive rehabilitation regime.: Yes Analysis of laboratory values and/or radiology reports with any subsequent need for medication adjustment and/or medical intervention. : Yes   I attest that I was present, lead the team conference, and concur with the assessment and plan of the team.   Dorien Chihuahua B 08/08/2020, 3:14 PM

## 2020-08-08 NOTE — Progress Notes (Signed)
Speech Language Pathology Daily Session Note  Patient Details  Name: Lori Bridges MRN: [id number] Date of Birth: 20-Mar-1930  Today's Date: 08/08/2020 SLP Individual Time: 0301-3143 SLP Individual Time Calculation (min): 27 min  Short Term Goals: Week 2: SLP Short Term Goal 1 (Week 2): Pt will demonstrate efficient mastication and oral clearance with dysphagia 2 texture trials across 2 sessions prior to advancement. SLP Short Term Goal 2 (Week 2): Pt will sustain her attention to basic, familiar tasks for 5 minute intervals with mod assist multimodal cues. SLP Short Term Goal 3 (Week 2): Pt will complete basic, familiar tasks with max assist for functional problem solving. SLP Short Term Goal 4 (Week 2): Pt will recall functional information relating to therapy sessions with Max A SLP Short Term Goal 5 (Week 2): Pt will initiate during tasks with max A multimodal cueing.  Skilled Therapeutic Interventions: Pt was seen for skilled ST targeting cognition. SLP facilitated session with Min A faded to Supervision A verbal cues for sequencing and functional problem solving during a basic 3-step action card sequencing task. Pt required Min A question cues to recall earlier PT session and "walking" in the gym, but Max A for recall of therapists' names. [name] present for session and confirms that pt is very near cognitive baseline now. Min A mostly verbal cues still required for task initiation in ST sessions, however vastly improved since admission and she still continues to benefit from skilled ST interventions. Pt also independently oriented to date, self, and place, but Total A needed for orientation to situation. Pt left laying in bed with alarm set and needs within reach. Continue per current plan of care.        Pain Pain Assessment Pain Scale: 0-10 Pain Score: 0-No pain  Therapy/Group: Individual Therapy  [name] 08/08/2020, 3:15 PM

## 2020-08-08 NOTE — Progress Notes (Signed)
[place] PHYSICAL MEDICINE & REHABILITATION PROGRESS NOTE  Subjective/Complaints: Patient seen sitting up in bed this AM.  Daughter at bedside.  She states he slept well overnight.  Daughter with questions about IV and discharge date.   ROS: Appears to deny CP, shortness of breath, nausea, vomiting, diarrhea.  Objective: Vital Signs: Blood pressure 113/70, pulse 94, temperature 98 F (36.7 C), resp. rate 16, height 5\' 6"  (1.676 m), weight 89.3 kg, SpO2 94 %. No results found. No results for input(s): WBC, HGB, HCT, PLT in the last 72 hours. Recent Labs    08/06/20 0504 08/07/20 0643  NA 133* 136  K 4.9 4.4  CL 101 102  CO2 25 24  GLUCOSE 104* 86  BUN 46* 41*  CREATININE 1.04* 1.01*  CALCIUM 10.2 10.4*    Physical Exam: BP 113/70 (BP Location: Left Arm)   Pulse 94   Temp 98 F (36.7 C)   Resp 16   Ht 5\' 6"  (1.676 m)   Wt 89.3 kg   SpO2 94%   BMI 31.78 kg/m  Constitutional: No distress . Vital signs reviewed. HENT: Normocephalic.  Atraumatic. Eyes: EOMI. No discharge. Cardiovascular: No JVD. Irregularly irregular. Respiratory: Normal effort.  No stridor. Bilaterally clear to auscultation.  GI: Non-distended. BS+.  Skin: Slowed. Delayed. Psych: Normal mood.  Normal behavior. Musc: Right sided edema. No tenderness in extremities.  Neuro: Alert and oriented x2 Motor: Somewhat limited due to participation, but appears to be >4/5 throughout, ?weaker in RLE  Assessment/Plan: 1. 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  Physiatrist is providing close team supervision and 24 hour management of active medical problems listed below.  Physiatrist and rehab team continue to assess barriers to discharge/monitor patient progress toward functional and medical  goals  Care Tool:  Bathing  Bathing activity did not occur: Safety/medical concerns (did not occur on eval, as pt was not able to be aroused awake) Body parts bathed by patient: Right arm, Left arm, Chest, Abdomen, Face   Body parts bathed by helper: Front perineal area, Buttocks, Right upper leg, Left upper leg, Right lower leg, Left lower leg Body parts n/a: Front perineal area, Buttocks, Right upper leg, Left upper leg, Right lower leg, Left lower leg   Bathing assist Assist Level: Maximal Assistance - Patient 24 - 49%     Upper Body Dressing/Undressing Upper body dressing Upper body dressing/undressing activity did not occur (including orthotics):  (did not occur on eval, as pt was not able to be aroused awake) What is the patient wearing?: Button up shirt    Upper body assist Assist Level: Maximal Assistance - Patient 25 - 49%    Lower Body Dressing/Undressing Lower body dressing      What is the patient wearing?: Incontinence brief, Pants     Lower body assist Assist for lower body dressing: Total Assistance - Patient < 25%     Toileting Toileting    Toileting assist Assist for toileting: 2 Helpers     Transfers Chair/bed transfer  Transfers assist  Chair/bed transfer activity did not occur: Safety/medical concerns  Chair/bed transfer assist level: Moderate Assistance - Patient 50 - 74%     Locomotion Ambulation   Ambulation assist   Ambulation activity did not occur: Safety/medical concerns  Assist level: 2 helpers (modA +1 with w/c follow) Assistive  device: Walker-rolling Max distance: 3   Walk 10 feet activity   Assist  Walk 10 feet activity did not occur: Safety/medical concerns  Assist level: 2 helpers (modA +1 with w/c follow) Assistive device: Walker-rolling   Walk 50 feet activity   Assist Walk 50 feet with 2 turns activity did not occur: Safety/medical concerns  Assist level: Moderate Assistance - Patient - 50 - 74% (modA +1 with  w/c follow)      Walk 150 feet activity   Assist Walk 150 feet activity did not occur: Safety/medical concerns         Walk 10 feet on uneven surface  activity   Assist Walk 10 feet on uneven surfaces activity did not occur: Safety/medical concerns         Wheelchair     Assist Will patient use wheelchair at discharge?: Yes Type of Wheelchair: Manual Wheelchair activity did not occur: Safety/medical concerns         Wheelchair 50 feet with 2 turns activity    Assist    Wheelchair 50 feet with 2 turns activity did not occur: Safety/medical concerns       Wheelchair 150 feet activity     Assist Wheelchair 150 feet activity did not occur: Safety/medical concerns          Medical Problem List and Plan: 1.    Left spastic hemiparesis, global aphasia secondary to small left frontal medial ICH and left temporal occipital ICH in the setting of chronic Coumadin/likely hypertensive  Continue CIR  WHO/PRAFO nightly  Team conference today to discuss current and goals and coordination of care, home and environmental barriers, and discharge planning with nursing, case manager, and therapies. Please see conference note from today as well.  2.  Antithrombotics: -DVT/anticoagulation: Subcutaneous heparin for DVT prophylaxis initiated July 25, 2020             -antiplatelet therapy: N/A 3. Pain Management: Tylenol as needed  Controlled on 8/11 4. Mood: D/c Ativan due to lethargy.              -antipsychotic agents: N/A 5. Neuropsych: This patient is not capable of making decisions on her own behalf. 6. Fluids/Electrolytes/Nutrition: Routine in and outs. 8.  Seizure prophylaxis.    Keppra 1000 mg twice daily, decreased to 500 twice daily on 8/10, will continue to wean.   EEG negative 9.  Atrial fibrillation.  Chronic Coumadin reversed.  Cardiac rate controlled. Continue Lopressor 75 mg twice daily  Rate controlled on 8/11 10. Post stroke dysphagia.  D2  thins  D/ced nasogastric tube feeds.    Dietary follow-up  Advance diet as tolerated 11.  Hypertension.  Lasix 20 mg daily, Avapro 150 mg daily, Toprol XL 100 mg daily currently on hold.    Continue hydralazine 25 mg twice daily,   Continue Lopressor 75 BID  Resume as needed  Controlled on 8/11 12.  Hypothyroidism.  Synthroid 13.  Hyperlipidemia.  Statin on hold in the setting of ICH 14.  UTI/E. coli.  Completed course of Rocephin 15.  GERD.  Continue Protonix  16.  Hyponatremia  Sodium 136 on 8/10 17.  Thrombocytopenia: Resolved  Platelets 158 on 8/6 18.  Leukopenia  Resolved 19.  Hyperkalemia  Potassium 4.4 on 8/10, labs ordered for tomorrow  [facility] ordered x3 previously 20.  CKD III  Creatinine 1.01 on 8/10, labs ordered for tomorrow 21.?  Sleep disturbance-per daughter  Melatonin ordered  Improving  LOS: 13 days A FACE TO FACE  EVALUATION WAS PERFORMED  Lori Bridges Lori Bridges 08/08/2020, 10:07 AM

## 2020-08-08 NOTE — Progress Notes (Signed)
Patient ID: Lori Bridges, female   DOB: May 23, 1930, 86 y.o.   MRN: [id number]  Team Conference Report to Patient/Family  Team Conference discussion was reviewed with the patient and caregiver, including goals, any changes in plan of care and target discharge date.  Patient and caregiver express understanding and are in agreement.  The patient has a target discharge date of 08/16/20.  [name] 08/08/2020, 2:31 PM

## 2020-08-08 NOTE — Progress Notes (Signed)
Patient ID: Lori Bridges, female   DOB: 01-Mar-1930, 86 y.o.   MRN: [id number]   Family education set for Friday, August 13th 9AM-11AM

## 2020-08-08 NOTE — Progress Notes (Signed)
Physical Therapy Session Note  Patient Details  Name: Lori Bridges MRN: [id number] Date of Birth: 09-20-1930  Today's Date: 08/08/2020 PT Individual Time: 0900-0957 PT Individual Time Calculation (min): 57 min   Short Term Goals: Week 2:  PT Short Term Goal 1 (Week 2): Pt will perform bed mobility with modA +1 PT Short Term Goal 2 (Week 2): Pt will maintain sitting EOB unsupported for >2 minute with supervision PT Short Term Goal 3 (Week 2): Pt will perform sit<>stand transfer with modA and LRAD PT Short Term Goal 4 (Week 2): Pt will perform bed<>chair transfer with modA and LRAD PT Short Term Goal 5 (Week 2): Pt will initiate pre-gait and gait training with LRAD and maxA.  Skilled Therapeutic Interventions/Progress Updates:    Pt received supine in bed, daughter at bedside, agreeable to PT session. Pt denies pain. B shoes donned with totalA while pt supine. Pt performed rolling R requiring minA for hip facilitation and HOHA for RUE to bedrail, pt requiring minA to manage BLE off EOB and minA for trunk to upright with cues for reaching LUE to footbard to promote midline. Pt performed stand-pivot transfer from EOB to TIS with minA, cues for sequencing and stepping pattern. Pt transported with totalA in [place] throughout session for time management. Pt ambulated 3x63ft with minA and RW with chair follow for safety; mirror placed in front of pt to assist with visual feedback. Pt requires minA for RW management as pt veers R and frequent cues for increasing step length and improving proximity to AD. Further gait distances limited 2/2 pt reporting "I'm tired." Daughter present during gait training to assist with motivation which helps with participation and progressions. Pt performed standing toe taps 1x5 on 4inch platform with RW support and minA for trunk stability; cues for sequencing and widening BOS. After completion of toe taps, pt reporting she's too tired to participate in further functional  mobility efforts (daughter not present after gait training). Reinforced with patient the importance of continued participation with therapy services to maximize mobility, pt continued to decline further efforts. Pt transported back to her room in TIS w/c, performed stand-pivot to bed with minA and sit<>supine with min/modA. Daughter present, bed alarm on, bed rails up, needs in reach.  Therapy Documentation Precautions:  Precautions Precautions: Fall Restrictions Weight Bearing Restrictions: No Other Position/Activity Restrictions: NPO with NGT. B soft rest restraints. General:   Vital Signs: Therapy Vitals Temp: 98 F (36.7 C) Pulse Rate: 94 Resp: 16 BP: 113/70 Patient Position (if appropriate): Lying Oxygen Therapy SpO2: 94 % O2 Device: Room Air  Therapy/Group: Individual Therapy   Elfie Costanza P Blade Scheff PT 08/08/2020, 7:54 AM

## 2020-08-08 NOTE — Plan of Care (Signed)
  Problem: RH BOWEL ELIMINATION Goal: RH STG MANAGE BOWEL WITH ASSISTANCE Description: STG Manage Bowel with max Assistance. Outcome: Progressing Goal: RH STG MANAGE BOWEL W/MEDICATION W/ASSISTANCE Description: STG Manage Bowel with Medication with max Assistance. Outcome: Progressing   Problem: RH BLADDER ELIMINATION Goal: RH STG MANAGE BLADDER WITH ASSISTANCE Description: STG Manage Bladder With max Assistance Outcome: Progressing Goal: RH STG MANAGE BLADDER WITH MEDICATION WITH ASSISTANCE Description: STG Manage Bladder With Medication With max Assistance. Outcome: Progressing Goal: RH STG MANAGE BLADDER WITH EQUIPMENT WITH ASSISTANCE Description: STG Manage Bladder With Equipment With max Assistance Outcome: Progressing   Problem: RH SKIN INTEGRITY Goal: RH STG SKIN FREE OF INFECTION/BREAKDOWN Outcome: Progressing Goal: RH STG MAINTAIN SKIN INTEGRITY WITH ASSISTANCE Description: STG Maintain Skin Integrity With max Assistance. Outcome: Progressing Goal: RH STG ABLE TO PERFORM INCISION/WOUND CARE W/ASSISTANCE Description: STG Able To Perform Incision/Wound Care With max Assistance. Outcome: Progressing   Problem: RH SAFETY Goal: RH STG ADHERE TO SAFETY PRECAUTIONS W/ASSISTANCE/DEVICE Description: STG Adhere to Safety Precautions With mod Assistance/Device. Outcome: Progressing Goal: RH STG DECREASED RISK OF FALL WITH ASSISTANCE Description: STG Decreased Risk of Fall With mod Assistance. Outcome: Progressing   Problem: RH COGNITION-NURSING Goal: RH STG USES MEMORY AIDS/STRATEGIES W/ASSIST TO PROBLEM SOLVE Description: STG Uses Memory Aids/Strategies With mod Assistance to Problem Solve. Outcome: Progressing Goal: RH STG ANTICIPATES NEEDS/CALLS FOR ASSIST W/ASSIST/CUES Description: STG Anticipates Needs/Calls for Assist With mod Assistance/Cues. Outcome: Progressing   Problem: RH KNOWLEDGE DEFICIT Goal: RH STG INCREASE KNOWLEDGE OF DIABETES Description: Patient will  be able to manage Diabetes and identify signs of hypo/hperglycemia while utilizing educational material provided with mod assist  Outcome: Progressing Goal: RH STG INCREASE KNOWLEDGE OF HYPERTENSION Description: Patient will be able to manage high blood pressure while utilizing educational material provided with min assist Outcome: Progressing Goal: RH STG INCREASE KNOWLEDGE OF DYSPHAGIA/FLUID INTAKE Description: Patient will be able to manage fluid management and swallowing techniques while utilizing educational material provided with min assist  Outcome: Progressing Goal: RH STG INCREASE KNOWLEGDE OF HYPERLIPIDEMIA Description: Patient will be able to manage high cholesterol while utilizing educational instructions provided with min assist  Outcome: Progressing Goal: RH STG INCREASE KNOWLEDGE OF STROKE PROPHYLAXIS Description: Patient will be able identify medications for stroke prophylaxis while utilizing educational material provided with min assist  Outcome: Progressing

## 2020-08-08 NOTE — Progress Notes (Addendum)
Occupational Therapy Session Note  Patient Details  Name: Lori Bridges MRN: [id number] Date of Birth: 08/30/1930  Today's Date: 08/08/2020 OT Individual Time: 8768-1157   OT Individual Time Calculation: 59 min   Short Term Goals: Week 2:  OT Short Term Goal 1 (Week 2): Pt will demonstrate improved attention and initiation to bathe UB with mod A. OT Short Term Goal 2 (Week 2): Pt will demonstrate improved attention to R side to doff/ don shirt over R arm with mod A. OT Short Term Goal 3 (Week 2): when in stedy lift, pt will be able to achieve upright stand with mod A of 1 during LB cleansing/dressing. OT Short Term Goal 4 (Week 2): Pt will demonstrate improved attention to R arm with performing self ROM with mod cues and  min A.  Skilled Therapeutic Interventions/Progress Updates:    Pt supine in bed, no signs or complaints of pain, agreeable to OT session.  Pt completed supine to sit EOB with supervision with HOB elevated.  Pt ambulated from EOB to toilet with min assist plus CGA from rehab tech.  Stand to sit with grab bar use needing min assist at standard toilet.  Pt had continent episode of urine.  Pt completed pericare with supervision.  Pt pulled pants up and down with min assist.  Sit to stand with min assist needing rocking x 3 method for momentum due to lower surface.  Pt ambulated to EOB with min assist for RW mgt and max VCs for sequencing of gait and increased step length.  Pt completed SPT from EOB to w/c with slighlty premature descent to w/c needing min assist.  Pt educated on safe mechanics with proper hand placement during stand to sit transfers.    Pt transported to gym and participated in standing and sitting dynamic balance activities including card match with right visual scanning and reaching component using RUE, and seated ball catch/toss/bounce.  Pt needing max simple VCs to initiate each task and to sequence through, however significantly improved participation due to  improved attention, problem solving, and memory recall noted today.  Pt completed SPT EOM to w/c with min assist for w/c mgt and balance.  Transported back to room.  SPT w/c to EOB with min assist and needing step by step cueing, however improved follow through of safe descent this time.  Sit to supine with min assist for RLE.  Call bell in reach, bed alarm on.  Therapy Documentation Precautions:  Precautions Precautions: Fall Restrictions Weight Bearing Restrictions: No Other Position/Activity Restrictions: NPO with NGT. B soft rest restraints.   Therapy/Group: Individual Therapy  Ezekiel Slocumb 08/08/2020, 12:25 PM

## 2020-08-09 ENCOUNTER — Inpatient Hospital Stay (HOSPITAL_COMMUNITY): Payer: Medicare Other

## 2020-08-09 ENCOUNTER — Inpatient Hospital Stay (HOSPITAL_COMMUNITY): Payer: Medicare Other | Admitting: Physical Therapy

## 2020-08-09 ENCOUNTER — Inpatient Hospital Stay (HOSPITAL_COMMUNITY): Payer: Medicare Other | Admitting: Occupational Therapy

## 2020-08-09 ENCOUNTER — Inpatient Hospital Stay (HOSPITAL_COMMUNITY): Payer: Medicare Other | Admitting: Speech Pathology

## 2020-08-09 DIAGNOSIS — R609 Edema, unspecified: Secondary | ICD-10-CM

## 2020-08-09 DIAGNOSIS — I613 Nontraumatic intracerebral hemorrhage in brain stem: Secondary | ICD-10-CM

## 2020-08-09 LAB — BASIC METABOLIC PANEL
Anion gap: 8 (ref 5–15)
BUN: 35 mg/dL — ABNORMAL HIGH (ref 8–23)
CO2: 25 mmol/L (ref 22–32)
Calcium: 10.1 mg/dL (ref 8.9–10.3)
Chloride: 104 mmol/L (ref 98–111)
Creatinine, Ser: 1.03 mg/dL — ABNORMAL HIGH (ref 0.44–1.00)
GFR calc Af Amer: 56 mL/min — ABNORMAL LOW (ref >60)
GFR calc non Af Amer: 48 mL/min — ABNORMAL LOW (ref >60)
Glucose, Bld: 84 mg/dL (ref 70–99)
Potassium: 4.4 mmol/L (ref 3.5–5.1)
Sodium: 137 mmol/L (ref 135–145)

## 2020-08-09 MED ORDER — FUROSEMIDE 20 MG PO TABS
20.0000 mg | ORAL_TABLET | Freq: Every day | ORAL | Status: DC
  Administered 2020-08-09 – 2020-08-10 (×2): 20 mg via ORAL
  Filled 2020-08-09 (×2): qty 1

## 2020-08-09 NOTE — Progress Notes (Signed)
Occupational Therapy Session Note  Patient Details  Name: Lori Bridges MRN: [id number] Date of Birth: 1930/06/15  Today's Date: 08/09/2020 OT Individual Time: 1001-1045 OT Individual Time Calculation (min): 44 min    Short Term Goals: Week 2:  OT Short Term Goal 1 (Week 2): Pt will demonstrate improved attention and initiation to bathe UB with mod A. OT Short Term Goal 2 (Week 2): Pt will demonstrate improved attention to R side to doff/ don shirt over R arm with mod A. OT Short Term Goal 3 (Week 2): when in stedy lift, pt will be able to achieve upright stand with mod A of 1 during LB cleansing/dressing. OT Short Term Goal 4 (Week 2): Pt will demonstrate improved attention to R arm with performing self ROM with mod cues and  min A.  Skilled Therapeutic Interventions/Progress Updates:    Pt sitting up in TIS w/c, agreeable to OT session with no c/o pain. Pts dtr reports she helped pt with LB bathing at bed level. Discussed DME needs, per dtr pt already has BSC and shower bench, therefore no OT DME needs.  Pt setup at sinkside for UB bathing.  Pt required mod simple VCs to initiate and sequence through task.  Pt required min assist to wash back.  Pt doffed and donned shirt with supervision, noting pt unbuttoning shirt using only right hand with success.  Pt doffed pants with CGA and mod VCs and donned pants with min to thread RLE. Pt ambulated to bathroom with min assist for RW mgt and toilet transfer with min assist using grab bars.  Pt completed 3/3 toileting tasks with supervision.  Pt ambulated to sink with RW needing min assist and frequent VCs for sequencing of RW and gait.  Pt washed hands with supervision.  Pt ambulated to EOB with mod assist for RW mgt to negotiate tight turn and small space negotiation.  Pt completed sit to supine with min assist for RLE support and step by step VCs to reposition in center of bed.  Call bell in reach, seat belt alarm on.    Therapy  Documentation Precautions:  Precautions Precautions: Fall Restrictions Weight Bearing Restrictions: No Other Position/Activity Restrictions: NPO with NGT. B soft rest restraints.   Therapy/Group: Individual Therapy  Ezekiel Slocumb 08/09/2020, 12:11 PM

## 2020-08-09 NOTE — Progress Notes (Signed)
Lori Bridges PHYSICAL MEDICINE & REHABILITATION PROGRESS NOTE  Subjective/Complaints: Patient seen sitting up in bed this morning, eating breakfast with therapies.  She states she slept well overnight.  Daughter states she slept well overnight.  Daughter is excited about changing discharge date.  ROS: Appears to deny CP, shortness of breath, nausea, vomiting, diarrhea.  Objective: Vital Signs: Blood pressure 104/76, pulse 80, temperature 98.4 F (36.9 C), resp. rate 17, height 5\' 6"  (1.676 m), weight 89.3 kg, SpO2 97 %. No results found. No results for input(s): WBC, HGB, HCT, PLT in the last 72 hours. Recent Labs    08/07/20 0643 08/09/20 0636  NA 136 137  K 4.4 4.4  CL 102 104  CO2 24 25  GLUCOSE 86 84  BUN 41* 35*  CREATININE 1.01* 1.03*  CALCIUM 10.4* 10.1    Physical Exam: BP 104/76    Pulse 80    Temp 98.4 F (36.9 C)    Resp 17    Ht 5\' 6"  (1.676 m)    Wt 89.3 kg    SpO2 97%    BMI 31.78 kg/m  Constitutional: No distress . Vital signs reviewed. HENT: Normocephalic.  Atraumatic. Eyes: EOMI. No discharge. Cardiovascular: No JVD.  Irregularly irregular. Respiratory: Normal effort.  No stridor.  Bilateral clear to auscultation. GI: Non-distended.  BS +. Skin: Warm and dry.  Intact. Psych: Slowed.  Delayed. Musc: Right lower extremity edema, right upper extremity edema improved. No tenderness in extremities. Neuro: Alert and oriented x2 Motor: Somewhat limited due to participation, but appears to be >4/5 throughout, ?weaker in RLE  Assessment/Plan: 1. 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  Physiatrist is providing close team supervision and 24 hour management of active medical problems listed below.  Physiatrist and rehab team continue to assess barriers  to discharge/monitor patient progress toward functional and medical goals  Care Tool:  Bathing  Bathing activity did not occur: Safety/medical concerns (did not occur on eval, as pt was not able to be aroused awake) Body parts bathed by patient: Right arm, Left arm, Chest, Abdomen, Face   Body parts bathed by helper: Front perineal area, Buttocks, Right upper leg, Left upper leg, Right lower leg, Left lower leg Body parts n/a: Front perineal area, Buttocks, Right upper leg, Left upper leg, Right lower leg, Left lower leg   Bathing assist Assist Level: Maximal Assistance - Patient 24 - 49%     Upper Body Dressing/Undressing Upper body dressing Upper body dressing/undressing activity did not occur (including orthotics):  (did not occur on eval, as pt was not able to be aroused awake) What is the patient wearing?: Button up shirt    Upper body assist Assist Level: Maximal Assistance - Patient 25 - 49%    Lower Body Dressing/Undressing Lower body dressing      What is the patient wearing?: Incontinence brief, Pants     Lower body assist Assist for lower body dressing: Total Assistance - Patient < 25%     Toileting Toileting    Toileting assist Assist for toileting: Minimal Assistance - Patient > 75%     Transfers Chair/bed transfer  Transfers assist  Chair/bed transfer activity did not occur: Safety/medical concerns  Chair/bed transfer assist level: Moderate Assistance - Patient 50 - 74%     Locomotion Ambulation   Ambulation assist  Ambulation activity did not occur: Safety/medical concerns  Assist level: 2 helpers (modA +1 with w/c follow) Assistive device: Walker-rolling Max distance: 3   Walk 10 feet activity   Assist  Walk 10 feet activity did not occur: Safety/medical concerns  Assist level: 2 helpers (modA +1 with w/c follow) Assistive device: Walker-rolling   Walk 50 feet activity   Assist Walk 50 feet with 2 turns activity did not occur:  Safety/medical concerns  Assist level: Moderate Assistance - Patient - 50 - 74% (modA +1 with w/c follow)      Walk 150 feet activity   Assist Walk 150 feet activity did not occur: Safety/medical concerns         Walk 10 feet on uneven surface  activity   Assist Walk 10 feet on uneven surfaces activity did not occur: Safety/medical concerns         Wheelchair     Assist Will patient use wheelchair at discharge?: Yes Type of Wheelchair: Manual Wheelchair activity did not occur: Safety/medical concerns         Wheelchair 50 feet with 2 turns activity    Assist    Wheelchair 50 feet with 2 turns activity did not occur: Safety/medical concerns       Wheelchair 150 feet activity     Assist Wheelchair 150 feet activity did not occur: Safety/medical concerns          Medical Problem List and Plan: 1.    Left spastic hemiparesis, global aphasia secondary to small left frontal medial ICH and left temporal occipital ICH in the setting of chronic Coumadin/likely hypertensive  Continue CIR  WHO/PRAFO nightly 2.  Antithrombotics: -DVT/anticoagulation: Subcutaneous heparin for DVT prophylaxis initiated July 25, 2020             -antiplatelet therapy: N/A 3. Pain Management: Tylenol as needed  Controlled on 8/11 4. Mood: D/c Ativan due to lethargy.              -antipsychotic agents: N/A 5. Neuropsych: This patient is not capable of making decisions on her own behalf. 6. Fluids/Electrolytes/Nutrition: Routine in and outs. 8.  Seizure prophylaxis.    Keppra 1000 mg twice daily, decreased to 500 twice daily on 8/10, plan to wean further tomorrow.   EEG negative 9.  Atrial fibrillation.  Chronic Coumadin reversed.  Cardiac rate controlled. Continue Lopressor 75 mg twice daily  Rate controlled on 8/12 10. Post stroke dysphagia.  Advanced to D3 thins  D/ced nasogastric tube feeds.    Dietary follow-up  Advance diet as tolerated 11.  Hypertension.  Lasix  20 mg daily, Avapro 150 mg daily, Toprol XL 100 mg daily currently on hold.    Continue hydralazine 25 mg twice daily,   Continue Lopressor 75 BID  Resume as needed  Controlled on 8/12 12.  Hypothyroidism.  Synthroid 13.  Hyperlipidemia.  Statin on hold in the setting of ICH 14.  UTI/E. coli.  Completed course of Rocephin 15.  GERD.  Continue Protonix  16.  Hyponatremia  Sodium 137 on 8/12 17.  Thrombocytopenia: Resolved  Platelets 158 on 8/6 18.  Leukopenia  Resolved 19.  Hyperkalemia  Potassium 4.4 on 8/12  Lokelma ordered x3 previously 20.  CKD III  Creatinine 1.03 on 8/12 21.?  Sleep disturbance-per daughter  Melatonin ordered  Improving 22.  Peripheral edema  Lasix 20 daily started on 8/12  LOS: 14 days A FACE TO FACE EVALUATION WAS PERFORMED  Lori Bridges [name] 08/09/2020, 8:26 AM

## 2020-08-09 NOTE — Progress Notes (Signed)
Speech Language Pathology Daily Session Note  Patient Details  Name: Lori Bridges MRN: [id number] Date of Birth: [birth year]  Today's Date: 08/09/2020 SLP Individual Time: 0730-0800 SLP Individual Time Calculation (min): 30 min  Short Term Goals: Week 2: SLP Short Term Goal 1 (Week 2): Pt will demonstrate efficient mastication and oral clearance with dysphagia 2 texture trials across 2 sessions prior to advancement. SLP Short Term Goal 2 (Week 2): Pt will sustain her attention to basic, familiar tasks for 5 minute intervals with mod assist multimodal cues. SLP Short Term Goal 3 (Week 2): Pt will complete basic, familiar tasks with max assist for functional problem solving. SLP Short Term Goal 4 (Week 2): Pt will recall functional information relating to therapy sessions with Max A SLP Short Term Goal 5 (Week 2): Pt will initiate during tasks with max A multimodal cueing.  Skilled Therapeutic Interventions: Pt was seen for skilled ST targeting dysphagia and cognitive goals. Pt initially required Moderate verbal cueing for initiation and sustained attention to functional tasks, however faded out to Suprvision once tasks begun. SLP also provided skilled observation of pt consuming Dys 2 breakfast with thin liquids (current diet) and upgraded trial of dysphagia 3 solids. Pt demonstrated fully efficient mastication and oral clearance without any need for compensatory strategies and only Min A verbal cues for slower rate with intake. No overt s/sx aspiration observed throughout intake of solids or liquids. Recommend pt upgrade to dysphagia 3 (mechanical soft) texture diet, continue thin liquids, may give pills whole in applesauce. Pt left in bed with alarm set and needs within reach. Continue per current plan of care.          Pain Pain Assessment Pain Scale: 0-10 Pain Score: 0-No pain  Therapy/Group: Individual Therapy  [name] 08/09/2020, 12:23 PM

## 2020-08-09 NOTE — Progress Notes (Signed)
Physical Therapy Session Note  Patient Details  Name: Lori Bridges MRN: [id number] Date of Birth: [birth year]  Today's Date: 08/09/2020 PT Individual Time: 0800-0900 & 1405-1445 PT Individual Time Calculation (min): 60 min + 26min  Short Term Goals: Week 2:  PT Short Term Goal 1 (Week 2): Pt will perform bed mobility with modA +1 PT Short Term Goal 2 (Week 2): Pt will maintain sitting EOB unsupported for >2 minute with supervision PT Short Term Goal 3 (Week 2): Pt will perform sit<>stand transfer with modA and LRAD PT Short Term Goal 4 (Week 2): Pt will perform bed<>chair transfer with modA and LRAD PT Short Term Goal 5 (Week 2): Pt will initiate pre-gait and gait training with LRAD and maxA.  Skilled Therapeutic Interventions/Progress Updates:     AM: Pt received supine in bed, feeding herself breakfast, [name] at bedside, pt agreeable to PT session. Pt denies pain. Spoke with daughter regarding DC planning; daughter reports DME access at home: rollator, [facility], shower chair. She reports family members have x2 manual w/c's and will take pictures for therapist to assess appropriateness. Spoke with daughter on importance of using a RW rather than a rollator for safety concerns, daughter verbalized understanding of all. Pt performed bridging in bed for donning pajama pants, totalA for donning B TED hose and shoes for time management. Pt performed supine<>sit with minA, cues for sequencing and technique for log rolling. Pt then performed stand-pivot transfer with minA and no AD to TIS w/c. Pt transported from room to therapy gym with totalA for time management where pt navigated up/down x4 steps with min/modA and B HR support, demo's step-to pattern and required cues for sequencing, technique (up with L foot leading, down with R foot leading). Rest break provided. Pt then transported to car where she required min/modA for stand-pivot car transfer, cues for sequencing and technique. Pt then  ambulated ~2x85ft from hallway into ADL room to recliner. She required multi-modal cueing for managing RW during turns and maintaining proximity to AD, as well as attending to environmental barriers to her R side. Pt required mod/maxA for sit<>stand from recliner to RW due to the rocking nature of the recliner, low height of recliner, and soft cushions. Pt continues to show moderate posterior lean on initial standing however corrects with cues and time. Pt transported back to her room and remained seated in her TIS w/c with daughter at bedside, needs in reach.   PM: Pt received supine in bed, [name] at bedside, pt agreeable to PT session. Pt denies pain. Pt performed supine<>sit with minA and HOB elevated, stand-pivot transfer with min/modA and no AD from EOB to TIS. Therapist cueing or sequencing and stepping pattern. Pt transported in [place] to therapy gym for energy conservation where pt navigated up/down 2x4steps (seated rest) with min/modA and B HR support. Pt with difficulty motor sequencing BLE's via step-to pattern (kept trying to perform reciprocal). Pt also with difficulty advancing BUE"s on HR's and would often leave her hands too far behind her resulting in an anterior trunk lean. Pt required extended seated rest breaks after stair completions 2/2 fatigue. Pt then performed lateral side stepping at while facing // bars with minA and mirror in front for visual feedback. Cues for increasing B step height/length which were corrected by limited carryover. Pt returned to room in TIS, SPT to EOB with minA, and sit<>supine with minA for BLE management with HOB elevated. Needs in reach, bed alarm on, daughter at bedside.  Therapy  Documentation Precautions:  Precautions Precautions: Fall Restrictions Weight Bearing Restrictions: No  Therapy/Group: Individual Therapy  Chelsea Nusz P Ethin Drummond PT 08/09/2020, 7:50 AM

## 2020-08-09 NOTE — Progress Notes (Signed)
°   08/09/20 2103  Assess: MEWS Score  Temp (!) 97.5 F (36.4 C)  BP 108/60  Pulse Rate (!) 115 (retaken)  Resp 18  SpO2 97 %  O2 Device Room Air  Assess: MEWS Score  MEWS Temp 0  MEWS Systolic 0  MEWS Pulse 2  MEWS RR 0  MEWS LOC 0  MEWS Score 2  MEWS Score Color Yellow  Assess: if the MEWS score is Yellow or Red  Were vital signs taken at a resting state? Yes  Focused Assessment No change from prior assessment  Early Detection of Sepsis Score *See Row Information* Medium  MEWS guidelines implemented *See Row Information* Yes  Treat  MEWS Interventions Administered scheduled meds/treatments  Pain Scale 0-10  Pain Score 0  Breathing 0  Negative Vocalization 0  Facial Expression 0  Body Language 0  Consolability 0  PAINAD Score 0  Take Vital Signs  Increase Vital Sign Frequency  Yellow: Q 2hr X 2 then Q 4hr X 2, if remains yellow, continue Q 4hrs  Escalate  MEWS: Escalate Yellow: discuss with charge nurse/RN and consider discussing with provider and RRT  Notify: Charge Nurse/RN  Name of Charge Nurse/RN Notified Jeanie Cooks  Date Charge Nurse/RN Notified 08/09/20  Time Charge Nurse/RN Notified 2245  Document  Patient Outcome Stabilized after interventions  Progress note created (see row info) Yes

## 2020-08-10 ENCOUNTER — Inpatient Hospital Stay (HOSPITAL_COMMUNITY): Payer: Medicare Other

## 2020-08-10 ENCOUNTER — Inpatient Hospital Stay (HOSPITAL_COMMUNITY): Payer: Medicare Other | Admitting: Speech Pathology

## 2020-08-10 ENCOUNTER — Ambulatory Visit (HOSPITAL_COMMUNITY): Payer: Medicare Other | Admitting: Physical Therapy

## 2020-08-10 ENCOUNTER — Encounter (HOSPITAL_COMMUNITY): Payer: Medicare Other | Admitting: Occupational Therapy

## 2020-08-10 MED ORDER — FUROSEMIDE 20 MG PO TABS
20.0000 mg | ORAL_TABLET | Freq: Two times a day (BID) | ORAL | Status: DC
  Administered 2020-08-10 – 2020-08-15 (×10): 20 mg via ORAL
  Filled 2020-08-10 (×10): qty 1

## 2020-08-10 NOTE — Progress Notes (Signed)
[facility] PHYSICAL MEDICINE & REHABILITATION PROGRESS NOTE  Subjective/Complaints: Patient seen sitting up in her chair this morning eating breakfast.  She states she slept well overnight.  Daughter at bedside, notes patient eating well.  ROS: Denies CP, shortness of breath, nausea, vomiting, diarrhea.  Objective: Vital Signs: Blood pressure 98/62, pulse 79, temperature 97.9 F (36.6 C), resp. rate 16, height 5\' 6"  (1.676 m), weight 86.9 kg, SpO2 98 %. No results found. No results for input(s): WBC, HGB, HCT, PLT in the last 72 hours. Recent Labs    08/09/20 0636  NA 137  K 4.4  CL 104  CO2 25  GLUCOSE 84  BUN 35*  CREATININE 1.03*  CALCIUM 10.1    Physical Exam: BP 98/62 (BP Location: Right Arm)   Pulse 79   Temp 97.9 F (36.6 C)   Resp 16   Ht 5\' 6"  (1.676 m)   Wt 86.9 kg   SpO2 98%   BMI 30.92 kg/m  Constitutional: No distress . Vital signs reviewed. HENT: Normocephalic.  Atraumatic. Eyes: EOMI. No discharge. Cardiovascular: No JVD. Respiratory: Normal effort.  No stridor.  Irregularly irregular. GI: Non-distended.  Bilateral clear to auscultation. Skin: Warm and dry.  Intact.  BS +. Psych: Slowed, delayed, improving Musc: Bilateral lower extremity edema.  No tenderness. Neuro: Alert and oriented x2. Motor: Somewhat limited due to participation, but appears to be >4/5 throughout, ?weaker in RLE, unchanged  Assessment/Plan: 1. 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  Physiatrist is providing close team supervision and 24 hour management of active medical problems listed below.  Physiatrist and rehab team continue to assess barriers to discharge/monitor patient progress toward functional and medical goals  Care Tool:  Bathing  Bathing activity  did not occur: Safety/medical concerns (did not occur on eval, as pt was not able to be aroused awake) Body parts bathed by patient: Right arm, Left arm, Chest, Abdomen, Face   Body parts bathed by helper: Front perineal area, Buttocks, Right upper leg, Left upper leg, Right lower leg, Left lower leg Body parts n/a: Front perineal area, Buttocks, Right upper leg, Left upper leg, Right lower leg, Left lower leg   Bathing assist Assist Level: Supervision/Verbal cueing     Upper Body Dressing/Undressing Upper body dressing Upper body dressing/undressing activity did not occur (including orthotics):  (did not occur on eval, as pt was not able to be aroused awake) What is the patient wearing?: Button up shirt    Upper body assist Assist Level: Supervision/Verbal cueing    Lower Body Dressing/Undressing Lower body dressing      What is the patient wearing?: Incontinence brief, Pants     Lower body assist Assist for lower body dressing: Minimal Assistance - Patient > 75%     Toileting Toileting    Toileting assist Assist for toileting: Contact Guard/Touching assist     Transfers Chair/bed transfer  Transfers assist  Chair/bed transfer activity did not occur: Safety/medical concerns  Chair/bed transfer assist level: Moderate Assistance - Patient 50 - 74%     Locomotion Ambulation   Ambulation assist   Ambulation activity did not occur: Safety/medical concerns  Assist level: 2 helpers (modA +1 with w/c follow) Assistive device: Walker-rolling Max distance: 3   Walk 10 feet activity   Assist  Walk 10 feet activity did not  occur: Safety/medical concerns  Assist level: 2 helpers (modA +1 with w/c follow) Assistive device: Walker-rolling   Walk 50 feet activity   Assist Walk 50 feet with 2 turns activity did not occur: Safety/medical concerns  Assist level: Moderate Assistance - Patient - 50 - 74% (modA +1 with w/c follow)      Walk 150 feet  activity   Assist Walk 150 feet activity did not occur: Safety/medical concerns         Walk 10 feet on uneven surface  activity   Assist Walk 10 feet on uneven surfaces activity did not occur: Safety/medical concerns         Wheelchair     Assist Will patient use wheelchair at discharge?: Yes Type of Wheelchair: Manual Wheelchair activity did not occur: Safety/medical concerns         Wheelchair 50 feet with 2 turns activity    Assist    Wheelchair 50 feet with 2 turns activity did not occur: Safety/medical concerns       Wheelchair 150 feet activity     Assist Wheelchair 150 feet activity did not occur: Safety/medical concerns          Medical Problem List and Plan: 1.    Left spastic hemiparesis, global aphasia secondary to small left frontal medial ICH and left temporal occipital ICH in the setting of chronic Coumadin/likely hypertensive  Continue CIR  WHO/PRAFO nightly 2.  Antithrombotics: -DVT/anticoagulation: Subcutaneous heparin for DVT prophylaxis initiated July 25, 2020             -antiplatelet therapy: N/A 3. Pain Management: Tylenol as needed  Controlled on 8/13 4. Mood: D/c Ativan due to lethargy.              -antipsychotic agents: N/A 5. Neuropsych: This patient is not capable of making decisions on her own behalf. 6. Fluids/Electrolytes/Nutrition: Routine in and outs. 8.  Seizure prophylaxis.    Keppra 1000 mg twice daily, decreased to 500 twice daily on 8/10, plan to wean further next week.   EEG negative 9.  Atrial fibrillation.  Chronic Coumadin reversed.  Cardiac rate controlled. Continue Lopressor 75 mg twice daily  Rate controlled on 8/13 10. Post stroke dysphagia.  Advanced to D3 thins  D/ced nasogastric tube feeds.    Dietary follow-up  Advance diet as tolerated 11.  Hypertension.  Lasix 20 mg daily, Avapro 150 mg daily, Toprol XL 100 mg daily currently on hold.    Continue hydralazine 25 mg twice daily,    Continue Lopressor 75 BID  Resume as needed  Controlled on 8/13 12.  Hypothyroidism.  Synthroid 13.  Hyperlipidemia.  Statin on hold in the setting of ICH 14.  UTI/E. coli.  Completed course of Rocephin 15.  GERD.  Continue Protonix  16.  Hyponatremia  Sodium 137 on 8/12 17.  Thrombocytopenia: Resolved  Platelets 158 on 8/6 18.  Leukopenia  Resolved 19.  Hyperkalemia  Potassium 4.4 on 8/12, labs ordered for Monday  [facility] ordered x3 previously 20.  CKD III  Creatinine 1.03 on 8/12, labs ordered for Monday 21.?  Sleep disturbance-per daughter  Melatonin ordered  Improving 22.  Peripheral edema  Lasix 20 daily started on 8/12, increased to twice daily on 8/13  LOS: 15 days A FACE TO FACE EVALUATION WAS PERFORMED  Lori Bridges Lori Bridges 08/10/2020, 9:18 AM

## 2020-08-10 NOTE — Progress Notes (Signed)
Occupational Therapy Session Note  Patient Details  Name: Lori Bridges MRN: [id number] Date of Birth: May 25, 1930  Today's Date: 08/10/2020 OT Individual Time: 6773-7366 OT Individual Time Calculation (min): 45 min    Short Term Goals: Week 2:  OT Short Term Goal 1 (Week 2): Pt will demonstrate improved attention and initiation to bathe UB with mod A. OT Short Term Goal 2 (Week 2): Pt will demonstrate improved attention to R side to doff/ don shirt over R arm with mod A. OT Short Term Goal 3 (Week 2): when in stedy lift, pt will be able to achieve upright stand with mod A of 1 during LB cleansing/dressing. OT Short Term Goal 4 (Week 2): Pt will demonstrate improved attention to R arm with performing self ROM with mod cues and  min A.  Skilled Therapeutic Interventions/Progress Updates:    Pt seen for family education with her daughter.  [name] was able to complete toilet transfers as well as tub shower transfers providing min physical assist to the pt and mod instructional cueing for staying inside the walker and squaring up to surfaces, while sitting softly.  The pt was able to donn her slip on shoes with min assist using a combination of crossing her LE over the opposite knee as well as utilizing a foot stool.  She was also able to donn her gripper sock over the left foot with use of a foot stool and increased time.  Discussed with daughter pt's current bathing and dressing level and encouraged her to continue working on bathing sit to stand at the sink or the shower as appropriate.  Educated on the need to not use direct commands but to instead ask the pt what she needed to do next during tasks to see how she sequences and then if needed, be more direct with cueing.  She voiced understanding.  Pt was left sitting up in the wheelchair at the end of the session in preparation for next session with SLP.  [name] was very efficient with assisting her mother throughout this session.      Therapy Documentation Precautions:  Precautions Precautions: Fall Restrictions Weight Bearing Restrictions: No Other Position/Activity Restrictions: NPO with NGT. B soft rest restraints.  Pain: Pain Assessment Pain Scale: Faces Pain Score: 0-No pain ADL: See Care Tool Section for some details of mobility and selfcare  Therapy/Group: Individual Therapy  Laurine Kuyper OTR/L 08/10/2020, 12:41 PM

## 2020-08-10 NOTE — Progress Notes (Signed)
Physical Therapy Weekly Progress Note  Patient Details  Name: Lori Bridges MRN: [id number] Date of Birth: 22-Aug-1930  Beginning of progress report period: August 03, 2020 End of progress report period: August 10, 2020  Today's Date: 08/10/2020 PT Individual Time: 4818-5631 PT Individual Time Calculation (min): 44 min   Patient has met 5 of 5 short term goals.  Pt making great progress during her rehabilitation stay. She has progressed to requiring min/modA for bed mobility, able to sit EOB with supervision without BUE support, can perform sit<>stand transfers with minA from EOB height and modA from a lower surface height, has performed stand-pivot transfers with minA and no AD, has ambulated variable distances up to 9f with min/modA and RW, and has navigated up/down x4 steps with CGA/minA and B HR support.   Patient continues to demonstrate the following deficits muscle weakness, impaired timing and sequencing, unbalanced muscle activation, motor apraxia, decreased coordination and decreased motor planning, decreased motor planning and ideational apraxia and decreased attention, decreased problem solving, decreased safety awareness and decreased memory and therefore will continue to benefit from skilled PT intervention to increase functional independence with mobility.  Patient progressing toward long term goals..  Continue plan of care.  PT Short Term Goals Week 3:  PT Short Term Goal 1 (Week 3): STG = LTG due to ELOS  Skilled Therapeutic Interventions/Progress Updates:     AM: Pt received sitting in TIS w/c, daughter ([name] at bedside, pt agreeable to PT session. Pt denies pain. Daughter reports that she assisted NT with SPT transfer from EOB to w/c and also assisted pt with LB dressing. Focus of session to be family education with hands on training and daughter was present throughout. Pt transported in [place] [facility] [place] to perform stairs. Informed and educated daughter on proper  technique, guarding positioning, and sequencing (step-to pattern with L foot leading ascent and R foot leading descent). Pt navigated up/down x4 steps with CGA/minA and B HR support with therapist for demonstration and after seated rest break pt again went up/down x4 steps with his daughter assisting. Daughter providing great cues for safety and sequencing and was well positioned while guarding. Educated daughter on possibility of positioning a chair at top of steps and strategically throughout house for pt to sit on incase of fatigue. Pt transported to car where height was adjusted as low as possible to mimic her [employer] Daughter performed transfer from w/c to car with safe guarding and appropriate cues for patient's sequencing. However daughter unable to perform transfer from car to w/c due to low seat height and required therapist intervention to complete. Educated daughter and patient on importance of pt's forward weight shift and foot/arm placement to assist with transfer. Pt transported to hallway where daughter performed assisted pt ambulating ~315fwith RW, therapist providing close supervision for safety. Daughter performing appropriate technique, pt with a x1 minor LOB to the L that daughter was able to safely correct. Educated daughter on general home safety such as removing throw rugs, clearing hallways, removing unnecessary clutter, and having well lit hallways. Provided pt with 20x18 w/c and removed TIS as pt's sitting balance/trunk control has much improved and pt will be DCing with a manual w/c. Pt ended session in her room, daughter at bedside, all questions/concerns addressed, needs met.  PM: Pt received sitting in recliner, daughter (WMariann Lasterat bedside, pt agreeable to PT session. Pt reports moderate fatigue from busy AM (back to back to back therapy family ed sessions). Pt  performed sit<>stand from recliner with modA and RW, ambulated a few feet towards her w/c with minA and RW. Pt  transported in w/c from room to ADL room where pt ambulated ~50f with minA for RW management and truncal stability, continuous cues for increasing proximity to AD and assist RW with turns (R>L). Pt told 3 items (spoon, jello, measuring cup) to find and locate in kitchen, pt able to recall 2/3 words and was able to find the spoon with minA cues but required modA cues for jello/measuring cup. Pt reading labeled stickers on cabinets for strategy but would not physically open cabinet prior to deciding item isn't located. After seated rest break, pt ambulated ~639fwith minA and RW in hallway and performed stepping over threshold x4 bouts. Required verbal cues for correct sequencing and safety approach as pt would attempt to step over threshold prior to managing RW. Pt transported back to her room with totalA for time management and remained seated in her w/c with needs in reach and daughter at bedside.   Therapy Documentation Precautions:  Precautions Precautions: Fall Restrictions Weight Bearing Restrictions: No Other Position/Activity Restrictions: NPO with NGT. B soft rest restraints. Vital Signs: Therapy Vitals Temp: 97.9 F (36.6 C) Pulse Rate: 79 Resp: 16 BP: 98/62 Patient Position (if appropriate): Sitting Oxygen Therapy SpO2: 98 % O2 Device: Room Air   Therapy/Group: Individual Therapy  Evart Mcdonnell P Camdyn Laden PT 08/10/2020, 12:19 PM

## 2020-08-10 NOTE — Plan of Care (Signed)
  Problem: RH BOWEL ELIMINATION Goal: RH STG MANAGE BOWEL WITH ASSISTANCE Description: STG Manage Bowel with max Assistance. Outcome: Progressing Goal: RH STG MANAGE BOWEL W/MEDICATION W/ASSISTANCE Description: STG Manage Bowel with Medication with max Assistance. Outcome: Progressing   Problem: RH BLADDER ELIMINATION Goal: RH STG MANAGE BLADDER WITH ASSISTANCE Description: STG Manage Bladder With max Assistance Outcome: Progressing Goal: RH STG MANAGE BLADDER WITH MEDICATION WITH ASSISTANCE Description: STG Manage Bladder With Medication With max Assistance. Outcome: Progressing Goal: RH STG MANAGE BLADDER WITH EQUIPMENT WITH ASSISTANCE Description: STG Manage Bladder With Equipment With max Assistance Outcome: Progressing   Problem: RH SKIN INTEGRITY Goal: RH STG SKIN FREE OF INFECTION/BREAKDOWN Outcome: Progressing Goal: RH STG MAINTAIN SKIN INTEGRITY WITH ASSISTANCE Description: STG Maintain Skin Integrity With max Assistance. Outcome: Progressing Goal: RH STG ABLE TO PERFORM INCISION/WOUND CARE W/ASSISTANCE Description: STG Able To Perform Incision/Wound Care With max Assistance. Outcome: Progressing   Problem: RH SAFETY Goal: RH STG ADHERE TO SAFETY PRECAUTIONS W/ASSISTANCE/DEVICE Description: STG Adhere to Safety Precautions With mod Assistance/Device. Outcome: Progressing Goal: RH STG DECREASED RISK OF FALL WITH ASSISTANCE Description: STG Decreased Risk of Fall With mod Assistance. Outcome: Progressing   Problem: RH COGNITION-NURSING Goal: RH STG USES MEMORY AIDS/STRATEGIES W/ASSIST TO PROBLEM SOLVE Description: STG Uses Memory Aids/Strategies With mod Assistance to Problem Solve. Outcome: Progressing Goal: RH STG ANTICIPATES NEEDS/CALLS FOR ASSIST W/ASSIST/CUES Description: STG Anticipates Needs/Calls for Assist With mod Assistance/Cues. Outcome: Progressing   Problem: RH KNOWLEDGE DEFICIT Goal: RH STG INCREASE KNOWLEDGE OF DIABETES Description: Patient will  be able to manage Diabetes and identify signs of hypo/hperglycemia while utilizing educational material provided with mod assist  Outcome: Progressing Goal: RH STG INCREASE KNOWLEDGE OF HYPERTENSION Description: Patient will be able to manage high blood pressure while utilizing educational material provided with min assist Outcome: Progressing Goal: RH STG INCREASE KNOWLEDGE OF DYSPHAGIA/FLUID INTAKE Description: Patient will be able to manage fluid management and swallowing techniques while utilizing educational material provided with min assist  Outcome: Progressing Goal: RH STG INCREASE KNOWLEGDE OF HYPERLIPIDEMIA Description: Patient will be able to manage high cholesterol while utilizing educational instructions provided with min assist  Outcome: Progressing Goal: RH STG INCREASE KNOWLEDGE OF STROKE PROPHYLAXIS Description: Patient will be able identify medications for stroke prophylaxis while utilizing educational material provided with min assist  Outcome: Progressing

## 2020-08-10 NOTE — Progress Notes (Signed)
Speech Language Pathology Weekly Progress and Session Note  Patient Details  Name: Lori Bridges MRN: [id number] Date of Birth: 08-Feb-1930  Beginning of progress report period: August 03, 2020 End of progress report period: August 10, 2020  Today's Date: 08/10/2020 SLP Individual Time: 1035-1100 SLP Individual Time Calculation (min): 25 min  Short Term Goals: Week 2: SLP Short Term Goal 1 (Week 2): Pt will demonstrate efficient mastication and oral clearance with dysphagia 2 texture trials across 2 sessions prior to advancement. SLP Short Term Goal 1 - Progress (Week 2): Met SLP Short Term Goal 2 (Week 2): Pt will sustain her attention to basic, familiar tasks for 5 minute intervals with mod assist multimodal cues. SLP Short Term Goal 2 - Progress (Week 2): Met SLP Short Term Goal 3 (Week 2): Pt will complete basic, familiar tasks with max assist for functional problem solving. SLP Short Term Goal 3 - Progress (Week 2): Met SLP Short Term Goal 4 (Week 2): Pt will recall functional information relating to therapy sessions with Max A SLP Short Term Goal 4 - Progress (Week 2): Progressing toward goal SLP Short Term Goal 5 (Week 2): Pt will initiate during tasks with max A multimodal cueing. SLP Short Term Goal 5 - Progress (Week 2): Met    New Short Term Goals: Week 3: SLP Short Term Goal 1 (Week 3): STG=LTG due to remaining length of stay  Weekly Progress Updates:   Pt has made excellent gains this reporting period and has met 4 out of 5 short term goals.  Pt is currently mod-max assist for tasks due to cognitive deficits.  Pt has demonstrated improved task initiation and participation in treatment sessions.  Pt is consuming a dys 3, thin liquids diet with min cues for use of swallowing precautions.  Pt and family education is ongoing.  Pt would continue to benefit from skilled ST while inpatient in order to maximize functional independence and reduce burden of care prior to discharge.   Anticipate that pt will need 24/7 supervision at discharge in addition to [place] follow up at next level of care.    Intensity: Minumum of 1-2 x/day, 30 to 90 minutes Frequency: 3 to 5 out of 7 days Duration/Length of Stay: 8/19 Treatment/Interventions: Cognitive remediation/compensation;Cueing hierarchy;Dysphagia/aspiration precaution training;Functional tasks;Internal/external aids;Patient/family education   Daily Session  Skilled Therapeutic Interventions: Pt was seen for skilled ST targeting family education prior to discharge next week.  [name] was present for today's therapy session and remained actively engaged in all training tasks.  [name] again reports that pt appears to be near her baseline for cognition, communication, and swallowing.  SLP provided skilled education regarding dys 3 textures and gave pt's daughter a handout to facilitate safe swallowing in the home environment.  SLP also discussed attention and memory compensatory strategies to maximize cognitive independence at home.  A handout was provided to facilitate carryover in between therapy sessions.  All questions were answered to pt's daughter's satisfaction at this time.  Pt was left in wheelchair with daughter at bedside.  Goals updated on this date to reflect current progress and plan of care.     General    Pain Pain Assessment Pain Scale: 0-10 Pain Score: 0-No pain  Therapy/Group: Individual Therapy  Burhanuddin Kohlmann, Selinda Orion 08/10/2020, 2:50 PM

## 2020-08-11 ENCOUNTER — Inpatient Hospital Stay (HOSPITAL_COMMUNITY): Payer: Medicare Other | Admitting: Physical Therapy

## 2020-08-11 ENCOUNTER — Inpatient Hospital Stay (HOSPITAL_COMMUNITY): Payer: Medicare Other | Admitting: Occupational Therapy

## 2020-08-11 MED ORDER — HYDRALAZINE HCL 25 MG PO TABS
25.0000 mg | ORAL_TABLET | Freq: Every day | ORAL | Status: DC
  Administered 2020-08-12: 25 mg
  Filled 2020-08-11: qty 1

## 2020-08-11 NOTE — Progress Notes (Signed)
Occupational Therapy Session Note  Patient Details  Name: Lori Bridges MRN: [id number] Date of Birth: 29-Feb-1930  Today's Date: 08/11/2020 OT Individual Time: 3244-0102 and 1415-1446 OT Individual Time Calculation (min): 56 min and 31 min  Skilled Therapeutic Interventions/Progress Updates:    Pt greeted in bed with no c/o pain, requesting to engage in bathing/dressing tasks during session. Pt opting for EOB vs sinkside ADLs today. Supine<sit completed unassisted using the bedrail with HOB elevated. Pt required intermittent vcs for improving upright alignment due to Rt lean, per daughter, pt leaned laterally Rt when sitting at baseline. Pt initiated using the Rt hand at dominant level, used affected limb when reaching for wash cloths, washing feet, and when using grooming items. Questioning cues utilized for increasing pts independence with sequencing today. Min A for sit<stand and for dynamic standing balance during perihygiene and pulling brief/pants over hips. OT set up brief like pull ups beforehand. She required Min A to bring her Rt LE into figure 4 position as needed, Total A for Teds and educated daughter on adaptive method for donning Teds at home. Pt completed an ambulatory transfer to the toilet using RW with Min A afterwards, able to lower her pants with Min A before voiding bladder. Assistance required for pulling up brief but pt was then able to fully elevate pants herself. Once she ambulated back into the room pt transferred to the w/c where OT set her up with hand sanitizer. Left her in the w/c with all needs within reach, safety belt, and daughter present. Tx focus placed on Rt NMR, ADL retraining, sitting/standing balance, and activity tolerance.   2nd Session 1:1 tx (31 min) Pt greeted in bed, no c/o pain, daughter Lori Bridges at bedside. We talked about the bed that pt will sleep on at home, which is flat without bedrails. Supine<sit from flat bed without rails completed with  supervision/cuing today. Lori Bridges showed OT a photo of the bed at home, which was very high due to boxspring placed under thick mattress. Recommended for Lori Bridges to remove the boxspring to increase pts safety. Sit<stand completed from lower surface with Min A, Min A for ambulation into the bathroom to void bladder using RW. Pt able to complete clothing mgt x2 and hygiene with Min balance assistance. Noted increased difficultly with sit<stand from standard toilet so OT retrieved a BSC to place over toilet to increase ease of toileting. Per daughter, pt will have a higher toilet at home. We discussed purchasing pull ups and timed toileting. After handwashing at the sink, pt practiced transfer in and out of bed with supervision/cuing, then transferred to the recliner. Left her with daughter and all needs within reach. Tx focus placed on Rt NMR, dynamic standing balance, and pt/family education.    Therapy Documentation Precautions:  Precautions Precautions: Fall Restrictions Weight Bearing Restrictions: No Other Position/Activity Restrictions: NPO with NGT. B soft rest restraints. Vital Signs: Therapy Vitals Pulse Rate: 98 BP: 105/71 Patient Position (if appropriate): Lying Pain: Pain Assessment Pain Scale: 0-10 Pain Score: 0-No pain ADL:  :     Therapy/Group: Individual Therapy  Mercades Bajaj A Hadleigh Felber 08/11/2020, 12:20 PM

## 2020-08-11 NOTE — Progress Notes (Signed)
[employer] PHYSICAL MEDICINE & REHABILITATION PROGRESS NOTE  Subjective/Complaints: No complaints. Denies pain, constipation, insomnia. Has a great appetite  ROS: Denies CP, shortness of breath, nausea, vomiting, diarrhea.  Objective: Vital Signs: Blood pressure 109/77, pulse 86, temperature 97.8 F (36.6 C), resp. rate 16, height 5\' 6"  (1.676 m), weight 89.4 kg, SpO2 99 %. No results found. No results for input(s): WBC, HGB, HCT, PLT in the last 72 hours. Recent Labs    08/09/20 0636  NA 137  K 4.4  CL 104  CO2 25  GLUCOSE 84  BUN 35*  CREATININE 1.03*  CALCIUM 10.1    Physical Exam: BP 109/77 (BP Location: Right Arm)   Pulse 86   Temp 97.8 F (36.6 C)   Resp 16   Ht 5\' 6"  (1.676 m)   Wt 89.4 kg   SpO2 99%   BMI 31.81 kg/m  General: Alert and oriented x 3, No apparent distress HEENT: Head is normocephalic, atraumatic, PERRLA, EOMI, sclera anicteric, oral mucosa pink and moist, dentition intact, ext ear canals clear,  Neck: Supple without JVD or lymphadenopathy Respiratory: Normal effort.  No stridor.  Irregularly irregular. GI: Non-distended.  Bilateral clear to auscultation. Skin: Warm and dry.  Intact.  BS +. Psych: Slowed, delayed, improving Musc: Bilateral lower extremity edema.  No tenderness. Neuro: Alert and oriented x2. Motor: Somewhat limited due to participation, but appears to be >4/5 throughout, ?weaker in RLE, unchanged   Assessment/Plan: 1. 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  Physiatrist is providing close team supervision and 24 hour management of active medical problems listed below.  Physiatrist and rehab team continue to assess barriers to discharge/monitor patient progress toward functional and medical goals  Care  Tool:  Bathing  Bathing activity did not occur: Safety/medical concerns (did not occur on eval, as pt was not able to be aroused awake) Body parts bathed by patient: Right arm, Left arm, Chest, Abdomen, Face, Front perineal area, Buttocks, Right upper leg, Left upper leg, Left lower leg   Body parts bathed by helper: Right lower leg Body parts n/a: Front perineal area, Buttocks, Right upper leg, Left upper leg, Right lower leg, Left lower leg   Bathing assist Assist Level: Minimal Assistance - Patient > 75%     Upper Body Dressing/Undressing Upper body dressing Upper body dressing/undressing activity did not occur (including orthotics):  (did not occur on eval, as pt was not able to be aroused awake) What is the patient wearing?: Button up shirt    Upper body assist Assist Level: Supervision/Verbal cueing    Lower Body Dressing/Undressing Lower body dressing      What is the patient wearing?: Incontinence brief, Pants     Lower body assist Assist for lower body dressing: Moderate Assistance - Patient 50 - 74%     Toileting Toileting    Toileting assist Assist for toileting: Minimal Assistance - Patient > 75%     Transfers Chair/bed transfer  Transfers assist  Chair/bed transfer activity did not occur: Safety/medical concerns  Chair/bed transfer assist level: Minimal Assistance - Patient > 75%     Locomotion Ambulation   Ambulation assist   Ambulation activity did not occur: Safety/medical concerns  Assist level: Minimal Assistance - Patient > 75% Assistive device: Walker-rolling Max distance: 64ft   Walk 10 feet activity  Assist  Walk 10 feet activity did not occur: Safety/medical concerns  Assist level: Minimal Assistance - Patient > 75% Assistive device: Walker-rolling   Walk 50 feet activity   Assist Walk 50 feet with 2 turns activity did not occur: Safety/medical concerns  Assist level: Minimal Assistance - Patient > 75% Assistive device:  Walker-rolling    Walk 150 feet activity   Assist Walk 150 feet activity did not occur: Safety/medical concerns         Walk 10 feet on uneven surface  activity   Assist Walk 10 feet on uneven surfaces activity did not occur: Safety/medical concerns   Assist level: Minimal Assistance - Patient > 75% Assistive device: Aeronautical engineer Will patient use wheelchair at discharge?: Yes Type of Wheelchair: Manual Wheelchair activity did not occur: Refused (fatigued at end of session)         Wheelchair 50 feet with 2 turns activity    Assist    Wheelchair 50 feet with 2 turns activity did not occur: Refused       Wheelchair 150 feet activity     Assist Wheelchair 150 feet activity did not occur: Refused          Medical Problem List and Plan: 1.    Left spastic hemiparesis, global aphasia secondary to small left frontal medial ICH and left temporal occipital ICH in the setting of chronic Coumadin/likely hypertensive  Continue CIR  WHO/PRAFO nightly 2.  Antithrombotics: -DVT/anticoagulation: Subcutaneous heparin for DVT prophylaxis initiated July 25, 2020             -antiplatelet therapy: N/A 3. Pain Management: Tylenol as needed  Controlled 8/14 4. Mood: D/c Ativan due to lethargy.              -antipsychotic agents: N/A 5. Neuropsych: This patient is not capable of making decisions on her own behalf. 6. Fluids/Electrolytes/Nutrition: Routine in and outs. 8.  Seizure prophylaxis.    Keppra 1000 mg twice daily, decreased to 500 twice daily on 8/10, plan to wean further next week.   EEG negative 9.  Atrial fibrillation.  Chronic Coumadin reversed.  Cardiac rate controlled. Continue Lopressor 75 mg twice daily  Controlled 8/14 10. Post stroke dysphagia.  Advanced to D3 thins  D/ced nasogastric tube feeds.    Dietary follow-up  Advance diet as tolerated 11.  Hypertension.  Lasix 20 mg daily, Avapro 150 mg daily, Toprol XL  100 mg daily currently on hold.    Continue hydralazine 25 mg twice daily,   Continue Lopressor 75 BID  Resume as needed  8/14: Low- decrease hydralazine to 25mg  daily 12.  Hypothyroidism.  Synthroid 13.  Hyperlipidemia.  Statin on hold in the setting of ICH 14.  UTI/E. coli.  Completed course of Rocephin 15.  GERD.  Continue Protonix  16.  Hyponatremia  Sodium 137 on 8/12 17.  Thrombocytopenia: Resolved  Platelets 158 on 8/6 18.  Leukopenia  Resolved 19.  Hyperkalemia  Potassium 4.4 on 8/12, labs ordered for Monday  [facility] ordered x3 previously 20.  CKD III  Creatinine 1.03 on 8/12, labs ordered for Monday 21.?  Sleep disturbance-per daughter  Melatonin ordered  Improving 22.  Peripheral edema  Lasix 20 daily started on 8/12, increased to twice daily on 8/13  LOS: 16 days A FACE TO FACE EVALUATION WAS PERFORMED  Martha Clan P Airanna Partin 08/11/2020, 1:37 PM

## 2020-08-11 NOTE — Progress Notes (Signed)
Physical Therapy Session Note  Patient Details  Name: Lori Bridges MRN: [id number] Date of Birth: [birth year]  Today's Date: 08/11/2020 PT Individual Time: 1104-1200 PT Individual Time Calculation (min): 56 min   Short Term Goals: Week 3:  PT Short Term Goal 1 (Week 3): STG = LTG due to ELOS  Skilled Therapeutic Interventions/Progress Updates:   Pt received sitting in WC and agreeable to PT. Pt transported to entrance of [facility] in [place]. Pt performed 5xSTS: 47 sec (>15 sec indicates increased fall risk) 42 and 52 sec. Cues for proper UE placement and improved anterior weight shifting. Gait training with RW 2 x 35f with CGA overall for safety. Mod assist x 1 on the first bout due to one LOB in turn resulting in near fall. Cues from PT to improve AD management. Reciprocal marches 2 x 10 BLE with BUE support on RW. WC mobility x 1034fwith min assist overall cement sidewalk with cues for increased stroke length. Pt returned to room and performed stand pivot transfer to bed with supervision assist and RW. Sit>supine completed with min assist and BLE management, and left supine in bed with call bell in reach and all needs met.           Therapy Documentation Precautions:  Precautions Precautions: Fall Restrictions Weight Bearing Restrictions: No Other Position/Activity Restrictions: NPO with NGT. B soft rest restraints.    Vital Signs: Therapy Vitals Pulse Rate: 98 BP: 105/71 Patient Position (if appropriate): Lying Pain: Pain Assessment Pain Scale: 0-10 Pain Score: 0-No pain    Therapy/Group: Individual Therapy  AuLorie Phenix/14/2021, 11:59 AM

## 2020-08-12 ENCOUNTER — Inpatient Hospital Stay (HOSPITAL_COMMUNITY): Payer: Medicare Other

## 2020-08-12 NOTE — Progress Notes (Signed)
Occupational Therapy Session Note  Patient Details  Name: Lori Bridges MRN: [id number] Date of Birth: 03/09/1930  Today's Date: 08/12/2020 OT Individual Time: 4536-4680 OT Individual Time Calculation (min): 54 min    Short Term Goals: Week 1:  OT Short Term Goal 1 (Week 1): Pt will demonstrate alertness to engage in UB bathing with max A. OT Short Term Goal 1 - Progress (Week 1): Met OT Short Term Goal 2 (Week 1): Pt will follow 1 step commands with 50 % accuracy with donning UB clothing with max A. OT Short Term Goal 2 - Progress (Week 1): Progressing toward goal OT Short Term Goal 3 (Week 1): Pt will sit to EOB with max a. OT Short Term Goal 3 - Progress (Week 1): Met OT Short Term Goal 4 (Week 1): Pt will maintain static sitting for 5 min at EOB with mod A to prepare her for sitting on a BSC. OT Short Term Goal 4 - Progress (Week 1): Met  Skilled Therapeutic Interventions/Progress Updates:    1:1. Pt received in bed with no pain reported. Pt agreeable to shower. Pt completes ambulatory transfer with RW in to shower with VC for RW management and hand placement. Pt bahes with CGA to wash buttocks in standing and MIN A to wash feet thoroughly. Pt dresses with set up for UB and CGA for pants sit to stand level. Pt requires total A to don teds and MOD A to don B socks d/t edema in BLE pt with difficulty reaching forward to toes in figure 4. Pt requires supervision and increased time to brush teeth. Exited sesion with pt in bed, exi talarm on and call light tin each  Therapy Documentation Precautions:  Precautions Precautions: Fall Restrictions Weight Bearing Restrictions: No Other Position/Activity Restrictions: NPO with NGT. B soft rest restraints. General:   Vital Signs:  Pain: Pain Assessment Pain Score: 0-No pain ADL:   Vision   Perception    Praxis   Exercises:   Other Treatments:     Therapy/Group: Individual Therapy  Tonny Branch 08/12/2020, 1:03  PM

## 2020-08-12 NOTE — Plan of Care (Signed)
  Problem: RH BOWEL ELIMINATION Goal: RH STG MANAGE BOWEL WITH ASSISTANCE Description: STG Manage Bowel with max Assistance. Outcome: Progressing Goal: RH STG MANAGE BOWEL W/MEDICATION W/ASSISTANCE Description: STG Manage Bowel with Medication with max Assistance. Outcome: Progressing   Problem: RH BLADDER ELIMINATION Goal: RH STG MANAGE BLADDER WITH ASSISTANCE Description: STG Manage Bladder With max Assistance Outcome: Progressing Goal: RH STG MANAGE BLADDER WITH MEDICATION WITH ASSISTANCE Description: STG Manage Bladder With Medication With max Assistance. Outcome: Progressing Goal: RH STG MANAGE BLADDER WITH EQUIPMENT WITH ASSISTANCE Description: STG Manage Bladder With Equipment With max Assistance Outcome: Progressing   Problem: RH SKIN INTEGRITY Goal: RH STG SKIN FREE OF INFECTION/BREAKDOWN Outcome: Progressing Goal: RH STG MAINTAIN SKIN INTEGRITY WITH ASSISTANCE Description: STG Maintain Skin Integrity With max Assistance. Outcome: Progressing Goal: RH STG ABLE TO PERFORM INCISION/WOUND CARE W/ASSISTANCE Description: STG Able To Perform Incision/Wound Care With max Assistance. Outcome: Progressing   Problem: RH SAFETY Goal: RH STG ADHERE TO SAFETY PRECAUTIONS W/ASSISTANCE/DEVICE Description: STG Adhere to Safety Precautions With mod Assistance/Device. Outcome: Progressing Goal: RH STG DECREASED RISK OF FALL WITH ASSISTANCE Description: STG Decreased Risk of Fall With mod Assistance. Outcome: Progressing   Problem: RH COGNITION-NURSING Goal: RH STG USES MEMORY AIDS/STRATEGIES W/ASSIST TO PROBLEM SOLVE Description: STG Uses Memory Aids/Strategies With mod Assistance to Problem Solve. Outcome: Progressing Goal: RH STG ANTICIPATES NEEDS/CALLS FOR ASSIST W/ASSIST/CUES Description: STG Anticipates Needs/Calls for Assist With mod Assistance/Cues. Outcome: Progressing   Problem: RH KNOWLEDGE DEFICIT Goal: RH STG INCREASE KNOWLEDGE OF DIABETES Description: Patient will  be able to manage Diabetes and identify signs of hypo/hperglycemia while utilizing educational material provided with mod assist  Outcome: Progressing Goal: RH STG INCREASE KNOWLEDGE OF HYPERTENSION Description: Patient will be able to manage high blood pressure while utilizing educational material provided with min assist Outcome: Progressing Goal: RH STG INCREASE KNOWLEDGE OF DYSPHAGIA/FLUID INTAKE Description: Patient will be able to manage fluid management and swallowing techniques while utilizing educational material provided with min assist  Outcome: Progressing Goal: RH STG INCREASE KNOWLEGDE OF HYPERLIPIDEMIA Description: Patient will be able to manage high cholesterol while utilizing educational instructions provided with min assist  Outcome: Progressing Goal: RH STG INCREASE KNOWLEDGE OF STROKE PROPHYLAXIS Description: Patient will be able identify medications for stroke prophylaxis while utilizing educational material provided with min assist  Outcome: Progressing

## 2020-08-12 NOTE — Progress Notes (Signed)
[place] PHYSICAL MEDICINE & REHABILITATION PROGRESS NOTE  Subjective/Complaints: No complaints Ambulated 100 feet with Cherie today! Pleasant, smiling  ROS: Denies CP, shortness of breath, nausea, vomiting, diarrhea.  Objective: Vital Signs: Blood pressure 107/66, pulse 88, temperature (!) 97.5 F (36.4 C), temperature source Oral, resp. rate 18, height 5\' 6"  (1.676 m), weight 89.4 kg, SpO2 99 %. No results found. No results for input(s): WBC, HGB, HCT, PLT in the last 72 hours. No results for input(s): NA, K, CL, CO2, GLUCOSE, BUN, CREATININE, CALCIUM in the last 72 hours.  Physical Exam: BP 107/66 (BP Location: Right Arm)   Pulse 88   Temp (!) 97.5 F (36.4 C) (Oral)   Resp 18   Ht 5\' 6"  (1.676 m)   Wt 89.4 kg   SpO2 99%   BMI 31.81 kg/m  General: Alert and oriented x 3, No apparent distress HEENT: Head is normocephalic, atraumatic, PERRLA, EOMI, sclera anicteric, oral mucosa pink and moist, dentition intact, ext ear canals clear,  Neck: Supple without JVD or lymphadenopathy Heart: Reg rate and rhythm. No murmurs rubs or gallops Respiratory: Normal effort.  No stridor.  Irregularly irregular. GI: Non-distended.  Bilateral clear to auscultation. Skin: Warm and dry.  Intact.  BS +. Psych: Slowed, delayed, improving Musc: Bilateral lower extremity edema.  No tenderness. Neuro: Alert and oriented x2. Motor: Somewhat limited due to participation, but appears to be >4/5 throughout, ?weaker in RLE, unchanged    Assessment/Plan: 1. 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  Physiatrist is providing close team supervision and 24 hour management of active medical problems listed below.  Physiatrist and rehab team continue to assess barriers to discharge/monitor  patient progress toward functional and medical goals  Care Tool:  Bathing  Bathing activity did not occur: Safety/medical concerns (did not occur on eval, as pt was not able to be aroused awake) Body parts bathed by patient: Right arm, Left arm, Chest, Abdomen, Face, Front perineal area, Buttocks, Right upper leg, Left upper leg, Left lower leg   Body parts bathed by helper: Right lower leg Body parts n/a: Front perineal area, Buttocks, Right upper leg, Left upper leg, Right lower leg, Left lower leg   Bathing assist Assist Level: Minimal Assistance - Patient > 75%     Upper Body Dressing/Undressing Upper body dressing Upper body dressing/undressing activity did not occur (including orthotics):  (did not occur on eval, as pt was not able to be aroused awake) What is the patient wearing?: Button up shirt    Upper body assist Assist Level: Supervision/Verbal cueing    Lower Body Dressing/Undressing Lower body dressing      What is the patient wearing?: Incontinence brief, Pants     Lower body assist Assist for lower body dressing: Moderate Assistance - Patient 50 - 74%     Toileting Toileting    Toileting assist Assist for toileting: Minimal Assistance - Patient > 75%     Transfers Chair/bed transfer  Transfers assist  Chair/bed transfer activity did not occur: Safety/medical concerns  Chair/bed transfer assist level: Minimal Assistance - Patient > 75%     Locomotion Ambulation   Ambulation assist   Ambulation activity did not occur: Safety/medical concerns  Assist level: Minimal Assistance - Patient > 75% Assistive device: Walker-rolling Max distance: 26ft   Walk 10 feet activity  Assist  Walk 10 feet activity did not occur: Safety/medical concerns  Assist level: Minimal Assistance - Patient > 75% Assistive device: Walker-rolling   Walk 50 feet activity   Assist Walk 50 feet with 2 turns activity did not occur: Safety/medical concerns  Assist  level: Minimal Assistance - Patient > 75% Assistive device: Walker-rolling    Walk 150 feet activity   Assist Walk 150 feet activity did not occur: Safety/medical concerns         Walk 10 feet on uneven surface  activity   Assist Walk 10 feet on uneven surfaces activity did not occur: Safety/medical concerns   Assist level: Minimal Assistance - Patient > 75% Assistive device: Aeronautical engineer Will patient use wheelchair at discharge?: Yes Type of Wheelchair: Manual Wheelchair activity did not occur: Refused (fatigued at end of session)         Wheelchair 50 feet with 2 turns activity    Assist    Wheelchair 50 feet with 2 turns activity did not occur: Refused       Wheelchair 150 feet activity     Assist Wheelchair 150 feet activity did not occur: Refused          Medical Problem List and Plan: 1.    Left spastic hemiparesis, global aphasia secondary to small left frontal medial ICH and left temporal occipital ICH in the setting of chronic Coumadin/likely hypertensive  Continue CIR  WHO/PRAFO nightly 2.  Antithrombotics: -DVT/anticoagulation: Subcutaneous heparin for DVT prophylaxis initiated July 25, 2020             -antiplatelet therapy: N/A 3. Pain Management: Tylenol as needed  Controlled 8/15 4. Mood: D/c Ativan due to lethargy.              -antipsychotic agents: N/A 5. Neuropsych: This patient is not capable of making decisions on her own behalf. 6. Fluids/Electrolytes/Nutrition: Routine in and outs. 8.  Seizure prophylaxis.    Keppra 1000 mg twice daily, decreased to 500 twice daily on 8/10, plan to wean further next week.   EEG negative 9.  Atrial fibrillation.  Chronic Coumadin reversed.  Cardiac rate controlled. 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  Advanced to D3 thins  D/ced nasogastric tube feeds.    Dietary follow-up  Advance diet as tolerated 11.   Hypertension.  Lasix 20 mg daily, Avapro 150 mg daily, Toprol XL 100 mg daily currently on hold.    Continue hydralazine 25 mg twice daily,   Continue Lopressor 75 BID  Resume as needed  8/14: Low- decrease hydralazine to 25mg  daily  8/15: Soft- stop hydralazine.  12.  Hypothyroidism.  Synthroid 13.  Hyperlipidemia.  Statin on hold in the setting of ICH 14.  UTI/E. coli.  Completed course of Rocephin 15.  GERD.  Continue Protonix  16.  Hyponatremia  Sodium 137 on 8/12 17.  Thrombocytopenia: Resolved  Platelets 158 on 8/6 18.  Leukopenia  Resolved 19.  Hyperkalemia  Potassium 4.4 on 8/12, labs ordered for Monday  [facility] ordered x3 previously 20.  CKD III  Creatinine 1.03 on 8/12, labs ordered for Monday 21.?  Sleep disturbance-per daughter  Melatonin ordered  Improving 22.  Peripheral edema  Lasix 20 daily started on 8/12, increased to twice daily on 8/13  LOS: 17 days A FACE TO [place] Brach [place] 08/12/2020, 4:04 PM

## 2020-08-12 NOTE — Progress Notes (Signed)
Physical Therapy Session Note  Patient Details  Name: Lori Bridges MRN: [id number] Date of Birth: April 14, 1930  Today's Date: 08/12/2020 PT Individual Time: 1005-1105 and 1510-1527 PT Individual Time Calculation (min): 60 min and 17 min  Short Term Goals: Week 3:  PT Short Term Goal 1 (Week 3): STG = LTG due to ELOS  Skilled Therapeutic Interventions/Progress Updates:     Session 1: Patient in w/c with her daughter in the room upon PT arrival. Patient alert and agreeable to PT session. Patient denied pain during session. Donned B TED hose and socks with total A for energy conservation and time management during session. Educated patient and family on elevating LEs in sitting to reduce LE edema. Recliner in room unable to elevate LEs, RN made aware and locating a different recliner for patient.  Therapeutic Activity: Bed Mobility: Patient performed sit to supine with min A-close supervision for LE management and safety due to patient remaining close to the EOB. Performed scooting L in the bed with supervision. Provided verbal cues for using bridging technique for scooting and scooting back before bringing LEs onto the bed for improved positioning in the bed. Transfers: Patient performed sit to/from stand x2 and stand pivot x3 with min A-CGA, assist for forward weight shift x2. Provided verbal cues for hand placement, forward weight shift, and reaching back to control descent while sitting.  Gait Training:  Placed a chair 50 ft away and had patient walk laps between her chair and w/c for improved endurance during gait training. Patient ambulated 100 feet then 50 feet using RW with CGA throughout. Required a seated rest break between trials due to fatigue. Ambulated with decreased gait speed, decreased step length and height R>L (worse with fatigue), mild forward trunk lean, and downward head gaze. Provided verbal cues for looking ahead, shoulder depression, erect posture, and increased R step  height for safety.   Wheelchair Mobility:  Patient was transported in the w/c with total A throughout session for energy conservation and time management.  Neuromuscular Re-ed: Patient performed the following LE motor control and sequencing activities for improved functional mobility: -sit to/from stand without AD and min use of UEs from mat table 2x5, focused on forward weight shift and hip extension to stand and controlled descent while sitting; progressed from min A-close supervision -sit to/from stand with RW 1x5 focused on hand placement for safety with RW and continued cues from previous trials without AD; continues to have poor recall of reaching back to sit when using RW  Patient in bed with her daughter in the room at end of session with breaks locked, bed alarm set, and all needs within reach.   Session 2: Patient in bed with her daughter in the room upon PT arrival. Patient denied pain during session. Reported significant fatigue following taking a shower with OT this afternoon. Declined any OOB mobility or bed level exercises. Patient was agreeable to reviewing stroke education and home safety with her daughter present for family education.   Educated and provided handouts on signs and symptoms of stroke using "BE FAST" acronym, BP assessment and management with use of daily BP log, including parameters for elevated BP, and educated on fall risk/prevention, home modifications to prevent falls, and activation of emergency services in the event of a fall during session.   Patient and her daughter were attentive and appreciative of education and handouts. Patient declined any further PT interventions due to fatigue at this time. Patient missed 13 min of skilled  PT due to fatigue, RN made aware. Will attempt to make-up missed time as able.    Patient in bed with her daughter at bedside at end of session with breaks locked, bed alarm set, and all needs within reach.    Therapy  Documentation Precautions:  Precautions Precautions: Fall Restrictions Weight Bearing Restrictions: No Other Position/Activity Restrictions: NPO with NGT. B soft rest restraints. General: PT Amount of Missed Time (min): 13 Minutes PT Missed Treatment Reason: Patient fatigue   Therapy/Group: Individual Therapy  Kimarion Chery L Francee Setzer PT, DPT  08/12/2020, 4:28 PM

## 2020-08-13 ENCOUNTER — Inpatient Hospital Stay (HOSPITAL_COMMUNITY): Payer: Medicare Other | Admitting: Occupational Therapy

## 2020-08-13 ENCOUNTER — Inpatient Hospital Stay (HOSPITAL_COMMUNITY): Payer: Medicare Other | Admitting: Speech Pathology

## 2020-08-13 ENCOUNTER — Inpatient Hospital Stay (HOSPITAL_COMMUNITY): Payer: Medicare Other

## 2020-08-13 LAB — BASIC METABOLIC PANEL
Anion gap: 8 (ref 5–15)
BUN: 27 mg/dL — ABNORMAL HIGH (ref 8–23)
CO2: 26 mmol/L (ref 22–32)
Calcium: 10 mg/dL (ref 8.9–10.3)
Chloride: 105 mmol/L (ref 98–111)
Creatinine, Ser: 1.21 mg/dL — ABNORMAL HIGH (ref 0.44–1.00)
GFR calc Af Amer: 46 mL/min — ABNORMAL LOW (ref >60)
GFR calc non Af Amer: 40 mL/min — ABNORMAL LOW (ref >60)
Glucose, Bld: 79 mg/dL (ref 70–99)
Potassium: 4.1 mmol/L (ref 3.5–5.1)
Sodium: 139 mmol/L (ref 135–145)

## 2020-08-13 NOTE — Progress Notes (Signed)
Physical Therapy Session Note  Patient Details  Name: Lori Bridges MRN: [id number] Date of Birth: September 20, 1930  Today's Date: 08/13/2020 PT Individual Time: 1916-6060 PT Individual Time Calculation (min): 57 min   Short Term Goals: Week 3:  PT Short Term Goal 1 (Week 3): STG = LTG due to ELOS  Skilled Therapeutic Interventions/Progress Updates:    Pt received supine in bed, daughter at bedside, pt agreeable to PT session. Pt denies pain. Daughter reports she has been assisting pt with bathroom needs as well as LB dressing, feels confident with her ability and she is pleased with pt progress. Pt performed supine<>sit with supervision with HOB flat, stand-pivot from EOB to w/c with minA and no AD, cues for anterior lean and safety approach. Pt transported in w/c with totalA for time management to main hallway where she ambulated 12f with CGA and RW, cues for maintaining proximity to AD, upright posture, and increasing B step length/height. After seated rest, pt then performed cone weaving 2x359fwith x7 cones. Pt with increased difficulty clearing cones and especially has a hard time with keeping B feet inside RW while turning and keeping proximity to AD while weaving, occasionally requiring minA for RW management. Limited carryover despite max cues and demonstration. Pt then performed standing catch with blue rubber ball, therapist guarding patient while therapy tech tossing ball. Pt with x1 LOB posterior requiring minA for correction. BaDiona Foleyoss strategically placed anteriorly, then on pt's R, and then on pt's L. Focusing on BUE reaction timing/coordination, standing balance, righting reactions, and core facilitation. Pt reports moderate fatigue and request to return to her room to rest. Pt transported with totalA to room and performed SPT with CGA and no AD, sit<>supine with minA for RLE management. Daughter at bedside. All needs met.   Therapy Documentation Precautions:  Precautions Precautions:  Fall Restrictions Weight Bearing Restrictions: No Other Position/Activity Restrictions: NPO with NGT. B soft rest restraints.  Therapy/Group: Individual Therapy  Lori Bridges PT 08/13/2020, 9:54 AM

## 2020-08-13 NOTE — Progress Notes (Signed)
[facility] PHYSICAL MEDICINE & REHABILITATION PROGRESS NOTE  Subjective/Complaints: No complaints. Daughter asks about warfarin Creatinine elevated- advised daughter to encourage hydration.   ROS: Denies CP, shortness of breath, nausea, vomiting, diarrhea.  Objective: Vital Signs: Blood pressure 112/77, pulse 84, temperature 98.7 F (37.1 C), temperature source Oral, resp. rate 17, height 5\' 6"  (1.676 m), weight 91.4 kg, SpO2 93 %. No results found. No results for input(s): WBC, HGB, HCT, PLT in the last 72 hours. Recent Labs    08/13/20 0505  NA 139  K 4.1  CL 105  CO2 26  GLUCOSE 79  BUN 27*  CREATININE 1.21*  CALCIUM 10.0    Physical Exam: BP 112/77   Pulse 84   Temp 98.7 F (37.1 C) (Oral)   Resp 17   Ht 5\' 6"  (1.676 m)   Wt 91.4 kg   SpO2 93%   BMI 32.52 kg/m  General: Alert and oriented x 3, No apparent distress HEENT: Head is normocephalic, atraumatic, PERRLA, EOMI, sclera anicteric, oral mucosa pink and moist, dentition intact, ext ear canals clear,  Neck: Supple without JVD or lymphadenopathy Heart: Reg rate and rhythm. No murmurs rubs or gallops Respiratory: Normal effort.  No stridor.  Irregularly irregular. GI: Non-distended.  Bilateral clear to auscultation. Skin: Warm and dry.  Intact.  BS +. Psych: Slowed, delayed, improving Musc: Bilateral lower extremity edema.  No tenderness. Neuro: Alert and oriented x2. Motor: Somewhat limited due to participation, but appears to be >4/5 throughout, ?weaker in RLE, unchanged  Assessment/Plan: 1. 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  Physiatrist is providing close team supervision and 24 hour management of active medical problems listed below.  Physiatrist and rehab team continue to assess  barriers to discharge/monitor patient progress toward functional and medical goals  Care Tool:  Bathing  Bathing activity did not occur: Safety/medical concerns (did not occur on eval, as pt was not able to be aroused awake) Body parts bathed by patient: Right arm, Left arm, Chest, Abdomen, Face, Front perineal area, Buttocks, Right upper leg, Left upper leg, Left lower leg   Body parts bathed by helper: Right lower leg Body parts n/a: Front perineal area, Buttocks, Right upper leg, Left upper leg, Right lower leg, Left lower leg   Bathing assist Assist Level: Minimal Assistance - Patient > 75%     Upper Body Dressing/Undressing Upper body dressing Upper body dressing/undressing activity did not occur (including orthotics):  (did not occur on eval, as pt was not able to be aroused awake) What is the patient wearing?: Button up shirt    Upper body assist Assist Level: Supervision/Verbal cueing    Lower Body Dressing/Undressing Lower body dressing      What is the patient wearing?: Incontinence brief, Pants     Lower body assist Assist for lower body dressing: Moderate Assistance - Patient 50 - 74%     Toileting Toileting    Toileting assist Assist for toileting: Minimal Assistance - Patient > 75%     Transfers Chair/bed transfer  Transfers assist  Chair/bed transfer activity did not occur: Safety/medical concerns  Chair/bed transfer assist level: Contact Guard/Touching assist     Locomotion Ambulation   Ambulation assist   Ambulation activity did not occur: Safety/medical concerns  Assist level: Contact Guard/Touching assist Assistive device: Walker-rolling Max distance: 100  ft   Walk 10 feet activity   Assist  Walk 10 feet activity did not occur: Safety/medical concerns  Assist level: Contact Guard/Touching assist Assistive device: Walker-rolling   Walk 50 feet activity   Assist Walk 50 feet with 2 turns activity did not occur: Safety/medical  concerns  Assist level: Contact Guard/Touching assist Assistive device: Walker-rolling    Walk 150 feet activity   Assist Walk 150 feet activity did not occur: Safety/medical concerns         Walk 10 feet on uneven surface  activity   Assist Walk 10 feet on uneven surfaces activity did not occur: Safety/medical concerns   Assist level: Minimal Assistance - Patient > 75% Assistive device: Aeronautical engineer Will patient use wheelchair at discharge?: Yes Type of Wheelchair: Manual Wheelchair activity did not occur: Refused (fatigued at end of session)         Wheelchair 50 feet with 2 turns activity    Assist    Wheelchair 50 feet with 2 turns activity did not occur: Refused       Wheelchair 150 feet activity     Assist Wheelchair 150 feet activity did not occur: Refused          Medical Problem List and Plan: 1.    Left spastic hemiparesis, global aphasia secondary to small left frontal medial ICH and left temporal occipital ICH in the setting of chronic Coumadin/likely hypertensive  Continue CIR  WHO/PRAFO nightly 2.  Antithrombotics: -DVT/anticoagulation: Subcutaneous heparin for DVT prophylaxis initiated July 25, 2020             -antiplatelet therapy: N/A 3. Pain Management: Tylenol as needed  Controlled 8/16 4. Mood: D/c Ativan due to lethargy.              -antipsychotic agents: N/A 5. Neuropsych: This patient is not capable of making decisions on her own behalf. 6. Fluids/Electrolytes/Nutrition: Routine in and outs. 8.  Seizure prophylaxis.    Keppra 1000 mg twice daily, decreased to 500 twice daily on 8/10, plan to wean further next week.   EEG negative 9.  Atrial fibrillation.  Chronic Coumadin reversed.  Cardiac rate controlled. Continue Lopressor 75 mg twice daily  Controlled 8/16 10. Post stroke dysphagia.  Advanced to D3 thins  D/ced nasogastric tube feeds.    Dietary follow-up  Advance diet as  tolerated 11.  Hypertension.  Lasix 20 mg daily, Avapro 150 mg daily, Toprol XL 100 mg daily currently on hold.    Continue hydralazine 25 mg twice daily,   Continue Lopressor 75 BID  Resume as needed  8/14: Low- decrease hydralazine to 25mg  daily  8/15: Soft- stop hydralazine.  8/16: still low- maintain Lasix and Lopressor 12.  Hypothyroidism.  Synthroid 13.  Hyperlipidemia.  Statin on hold in the setting of ICH 14.  UTI/E. coli.  Completed course of Rocephin 15.  GERD.  Continue Protonix  16.  Hyponatremia  Sodium 137 on 8/12 17.  Thrombocytopenia: Resolved  Platelets 158 on 8/6 18.  Leukopenia  Resolved 19.  Hyperkalemia  Potassium 4.4 on 8/12, labs ordered for Monday  [facility] ordered x3 previously 20.  CKD III  Creatinine 1.03 on 8/12, 1.21 on 8/16- encourage hydration 21.?  Sleep disturbance-per daughter  Melatonin ordered  Improving 22.  Peripheral edema  Lasix 20 daily started on 8/12, increased to twice daily on 8/13  LOS: 18 days A FACE TO [place] Edyn Qazi  08/13/2020, 10:29 AM

## 2020-08-13 NOTE — Progress Notes (Signed)
Pt being transported off of the unit by transport to CT/ Xray.

## 2020-08-13 NOTE — Plan of Care (Signed)
  Problem: RH Wheelchair Mobility Goal: LTG Patient will propel w/c in controlled environment (PT) Description: LTG: Patient will propel wheelchair in controlled environment, # of feet with assist (PT) 08/13/2020 1003 by Sherilyn Dacosta, [place] Hankin P, PT Outcome: Not Applicable Note: Anticipate pt will be functional ambulator at time of DC 08/13/2020 1003 by Jericca Russett P, PT Reactivated 08/13/2020 1000 by Rochel Privett P, PT Outcome: Not Applicable Note: Anticipate pt will be functional ambulator at time of DC. DC w/c propulsion goal. Goal: LTG Patient will propel w/c in home environment (PT) Description: LTG: Patient will propel wheelchair in home environment, # of feet with assistance (PT). 08/13/2020 1003 by Ameriah Lint P, PT Outcome: Not Applicable Note: Anticipate pt will be functional ambulator at time of DC 08/13/2020 1003 by Nema Oatley P, PT Reactivated 08/13/2020 1000 by Ginnie Smart P, PT Outcome: Not Applicable Flowsheets (Taken 08/13/2020 1000) LTG: Propel w/c distance in home environment: Anticipate pt will be functional ambulator at time of DC. DC w/c propulsion goal.

## 2020-08-13 NOTE — Progress Notes (Signed)
Patient ID: Lori Bridges, female   DOB: 1930/09/22, 86 y.o.   MRN: [id number]   Patient WC ordered through [employer]. Family will provide RW.

## 2020-08-13 NOTE — Plan of Care (Deleted)
Wheelchair for community mobility only where family will provide assist. Anticipate pt will be functional household ambulator by time of DC.

## 2020-08-13 NOTE — Progress Notes (Signed)
Occupational Therapy Session Note  Patient Details  Name: Lori Bridges MRN: [id number] Date of Birth: 12/04/1930  Today's Date: 08/13/2020 OT Individual Time: 1300-1345 OT Individual Time Calculation (min): 45 min    Short Term Goals: Week 2:  OT Short Term Goal 1 (Week 2): Pt will demonstrate improved attention and initiation to bathe UB with mod A. OT Short Term Goal 2 (Week 2): Pt will demonstrate improved attention to R side to doff/ don shirt over R arm with mod A. OT Short Term Goal 3 (Week 2): when in stedy lift, pt will be able to achieve upright stand with mod A of 1 during LB cleansing/dressing. OT Short Term Goal 4 (Week 2): Pt will demonstrate improved attention to R arm with performing self ROM with mod cues and  min A.  Skilled Therapeutic Interventions/Progress Updates:    Pt returning to EOB from toileting with nurse tech upon OT arrival.  Pts dtr present throughout session. Pt agreeable to OT session with no c/o pain. Pt ambulated to sink and stand to sit with CGA.  Pt doffed button down shirt with mod I and bathed UB with setup.  Pt doffed pants and brief and bathed LB with supervision.  Pt required increased time to initiate tasks however able to initiate without assist.  Pt donned shirt with setup and donned brief and pants with min assist to thread over bilateral feet.  Pt completed oral mouthwash with setup.  Pt requesting back to bed to rest after sinkside self care.  Educated pt on safe stand to sit technique to slow descent and increase safety using visual demo.  Pt ambulated from sink to EOB and stand to sit with CGA with improved body mechanics and control.  Sit to supine with supervision for VCs to position towards [facility] and center of bed.  Call bell in reach, bed alarm on.  Improved initiation of tasks noted today and increased independence with LB dressing.  Therapy Documentation Precautions:  Precautions Precautions: Fall Restrictions Weight Bearing Restrictions:  No Other Position/Activity Restrictions: NPO with NGT. B soft rest restraints.   Therapy/Group: Individual Therapy  Ezekiel Slocumb 08/13/2020, 12:55 PM

## 2020-08-13 NOTE — Progress Notes (Signed)
Speech Language Pathology Daily Session Note  Patient Details  Name: Lori Bridges MRN: [id number] Date of Birth: 12/11/1930  Today's Date: 08/13/2020 SLP Individual Time: 1117-3567 SLP Individual Time Calculation (min): 27 min  Short Term Goals: Week 3: SLP Short Term Goal 1 (Week 3): STG=LTG due to remaining length of stay  Skilled Therapeutic Interventions: Pt was seen for skilled ST targeting cognitive goals. SLP facilitated session with overall Min A verbal and visual cues for problem solving during a basic grocery ad search. Pt created and use list for recall during task with only Supervision A verbal cues. Min A provided for task organization and to alternate attention between the list and grocery ad. Pt left laying in bed with alarm set and needs within reach, daughter present. Continue per current plan of care.        Pain Pain Assessment Pain Scale: 0-10 Pain Score: 0-No pain  Therapy/Group: Individual Therapy  [name] 08/13/2020, 12:09 PM

## 2020-08-14 ENCOUNTER — Inpatient Hospital Stay (HOSPITAL_COMMUNITY): Payer: Medicare Other

## 2020-08-14 ENCOUNTER — Inpatient Hospital Stay (HOSPITAL_COMMUNITY): Payer: Medicare Other | Admitting: Occupational Therapy

## 2020-08-14 DIAGNOSIS — W19XXXA Unspecified fall, initial encounter: Secondary | ICD-10-CM

## 2020-08-14 LAB — BASIC METABOLIC PANEL
Anion gap: 8 (ref 5–15)
BUN: 26 mg/dL — ABNORMAL HIGH (ref 8–23)
CO2: 26 mmol/L (ref 22–32)
Calcium: 9.8 mg/dL (ref 8.9–10.3)
Chloride: 104 mmol/L (ref 98–111)
Creatinine, Ser: 1.13 mg/dL — ABNORMAL HIGH (ref 0.44–1.00)
GFR calc Af Amer: 50 mL/min — ABNORMAL LOW (ref >60)
GFR calc non Af Amer: 43 mL/min — ABNORMAL LOW (ref >60)
Glucose, Bld: 88 mg/dL (ref 70–99)
Potassium: 4 mmol/L (ref 3.5–5.1)
Sodium: 138 mmol/L (ref 135–145)

## 2020-08-14 MED ORDER — LEVETIRACETAM 100 MG/ML PO SOLN
250.0000 mg | Freq: Two times a day (BID) | ORAL | Status: DC
  Administered 2020-08-14: 250 mg via ORAL
  Filled 2020-08-14: qty 5

## 2020-08-14 NOTE — Plan of Care (Signed)
  Problem: RH BOWEL ELIMINATION Goal: RH STG MANAGE BOWEL WITH ASSISTANCE Description: STG Manage Bowel with max Assistance. Outcome: Progressing Goal: RH STG MANAGE BOWEL W/MEDICATION W/ASSISTANCE Description: STG Manage Bowel with Medication with max Assistance. Outcome: Progressing   Problem: RH BLADDER ELIMINATION Goal: RH STG MANAGE BLADDER WITH ASSISTANCE Description: STG Manage Bladder With max Assistance Outcome: Progressing Goal: RH STG MANAGE BLADDER WITH MEDICATION WITH ASSISTANCE Description: STG Manage Bladder With Medication With max Assistance. Outcome: Progressing Goal: RH STG MANAGE BLADDER WITH EQUIPMENT WITH ASSISTANCE Description: STG Manage Bladder With Equipment With max Assistance Outcome: Progressing   Problem: RH SKIN INTEGRITY Goal: RH STG SKIN FREE OF INFECTION/BREAKDOWN Outcome: Progressing Goal: RH STG MAINTAIN SKIN INTEGRITY WITH ASSISTANCE Description: STG Maintain Skin Integrity With max Assistance. Outcome: Progressing- new abrasion R shoulder, approx size of quarter, will monitor Goal: RH STG ABLE TO PERFORM INCISION/WOUND CARE W/ASSISTANCE Description: STG Able To Perform Incision/Wound Care With max Assistance. Outcome: Progressing   Problem: RH SAFETY Goal: RH STG ADHERE TO SAFETY PRECAUTIONS W/ASSISTANCE/DEVICE Description: STG Adhere to Safety Precautions With mod Assistance/Device. Outcome: Progressing Goal: RH STG DECREASED RISK OF FALL WITH ASSISTANCE Description: STG Decreased Risk of Fall With mod Assistance. Outcome: Progressing   Problem: RH COGNITION-NURSING Goal: RH STG USES MEMORY AIDS/STRATEGIES W/ASSIST TO PROBLEM SOLVE Description: STG Uses Memory Aids/Strategies With mod Assistance to Problem Solve. Outcome: Progressing Goal: RH STG ANTICIPATES NEEDS/CALLS FOR ASSIST W/ASSIST/CUES Description: STG Anticipates Needs/Calls for Assist With mod Assistance/Cues. Outcome: Progressing   Problem: RH KNOWLEDGE DEFICIT Goal: RH  STG INCREASE KNOWLEDGE OF DIABETES Description: Patient will be able to manage Diabetes and identify signs of hypo/hperglycemia while utilizing educational material provided with mod assist  Outcome: Progressing Goal: RH STG INCREASE KNOWLEDGE OF HYPERTENSION Description: Patient will be able to manage high blood pressure while utilizing educational material provided with min assist Outcome: Progressing Goal: RH STG INCREASE KNOWLEDGE OF DYSPHAGIA/FLUID INTAKE Description: Patient will be able to manage fluid management and swallowing techniques while utilizing educational material provided with min assist  Outcome: Progressing Goal: RH STG INCREASE KNOWLEGDE OF HYPERLIPIDEMIA Description: Patient will be able to manage high cholesterol while utilizing educational instructions provided with min assist  Outcome: Progressing Goal: RH STG INCREASE KNOWLEDGE OF STROKE PROPHYLAXIS Description: Patient will be able identify medications for stroke prophylaxis while utilizing educational material provided with min assist  Outcome: Progressing

## 2020-08-14 NOTE — Progress Notes (Signed)
Speech Language Pathology Daily Session Note  Patient Details  Name: POSIE LILLIBRIDGE MRN: [id number] Date of Birth: Jun 25, 1930  Today's Date: 08/14/2020 SLP Individual Time: 0900-1000 SLP Individual Time Calculation (min): 60 min  Short Term Goals: Week 3: SLP Short Term Goal 1 (Week 3): STG=LTG due to remaining length of stay  Skilled Therapeutic Interventions:   Skilled SLP intervention focused on cognition and dysphagia. Trials completed with regular solids (graham cracker). Slightly increased time needed with mastication but oral clearance was adequate and swallow reflex was timely. No overt s/sx of aspiration or penetration noted.  Pt alert and participated well throughout session. Mod A with written cues and increased time needed with simple time and  money calculations. Pt sustained attention to sort holidays into chronological order with min A verbal cues and visual cues to increase scanning to right with written information. Improvement noted in processing speed and responses with verbal information relating to rehab situation and hospital environment. Pt left upright in bed with bed alarm on call button within reach and family present at bedside. Cont with dysphagia 3 diet with thin liquids. Cont with therapy per plan of care.   Pain Pain Assessment Pain Scale: Faces Faces Pain Scale: No hurt  Therapy/Group: Individual Therapy  Gregary Signs A Leala Bryand 08/14/2020, 10:08 AM

## 2020-08-14 NOTE — Progress Notes (Signed)
Speech Language Pathology Discharge Summary  Patient Details  Name: URIJAH RAYNOR MRN: [id number] Date of Birth: [birth year]  Today's Date: 08/15/2020 SLP Individual Time: 9233-0076 SLP Individual Time Calculation (min): 55 min   Skilled Therapeutic Interventions:  Pt was seen for skilled ST targeting cognitive goals. [name] present for session. Pt was very bright and OX4 this morning. SLP administered Cognistat cognitive evaluation as post-test of cognitive-linguistic skills to assess progress since admission. Pt's scores that fell outside of normal limits included the following subtests: attention (4-mod), naming (5-mild), visual construction (2-mod-sev), memory (5-mod-sev), calculations (1-mod), and judgement (3-mild). Pt arrived at appropriate answers for judgement and calculations with Min A verbal and visual cues for problem solving strategies. Multiple choice or category cues required for recall of 4/4 words in delayed recall task, and Mod A verbal and visual cues for complex problem solving during visual construction task. Min A question cues/prompts were provided for elaboration during verbal description tasks. In functional conversation, pt required Mod A verbal cues to anticipate how mobility and ADLs will differ due to current functional status in comparison to prior to admission. Pt left laying in bed with alarm set and needs within reach, daughter still present. Continue per current plan of care.    Patient has met 6 of 6 long term goals.  Patient to discharge at overall Supervision;Min level.  Reasons goals not met: n/a   Clinical Impression/Discharge Summary:   Pt made functional gains and met and in fact exceeded 6 out of 6 long term goals this admission. Pt currently requires ~Min assist for basic familiar tasks due to cognitive impairments impacting her basic problem solving, attention, initiation, emergent awareness, and short term memory. Pt has made vast improvements in  all areas since admission, most notably related to her initiation (both verbal and task driven), basic problem solving, and attention. However, pt's processing and initiation is still decreased, and still requires cues for problem solving at times and therefore will require 24/7 supervision at discharge for greatest safety. Pt is consuming an upgraded dysphagia 3 (mechanical soft) diet with thin liquids with no overt s/sx aspiration or evidence of oral holding or residue. Her mastication is mildly prolonged at times, but functional, and family reports this is baseline. She uses swallow precautions with only Supervision and no longer needs assistance self-feeding. Given continued cognitive deficits still present, recommend pt continue to receive skilled ST services upon discharge. Pt and family education has been ongoing and is complete at this time.    Care Partner:  Caregiver Able to Provide Assistance: Yes  Type of Caregiver Assistance: Cognitive  Recommendation:  Home Health SLP;24 hour supervision/assistance  Rationale for SLP Follow Up: Maximize cognitive function and independence;Reduce caregiver burden;Maximize swallowing safety   Equipment: none   Reasons for discharge: Discharged from hospital   Patient/Family Agrees with Progress Made and Goals Achieved: Yes    [name] 08/15/2020, 10:11 AM

## 2020-08-14 NOTE — Progress Notes (Addendum)
Occupational Therapy Weekly Progress Note  Patient Details  Name: CERIA SUMINSKI MRN: [id number] Date of Birth: 07-28-1930  Beginning of progress report period: August 03, 2020 End of progress report period: August 14, 2020  Today's Date: 08/14/2020 OT Individual Time: 1415-1500 OT Individual Time Calculation (min): 45 min    Patient has met 4 of 4 short term goals.  Pt is making excellent functional gains with skilled OT.  Pt has improved from needing mod to max assist with UB/LB dressing and bathing and toileting to now only needing supervision for UB dressing and bathing and CGA to min assist for LB dressing/bathing and toileting.  Pt working on improvement of initiating and problem solving during self care, needing min VCs intermittently now.  Pt is also demonstrating improved functional mobility independence and safety needing CGA and intermittent min assist for RW mgt.  Pts family have been actively participating and are very supportive during sessions and will be able to provide necessary assist to pt when discharged to home setting.  Pt requires skilled OT to ensure safe transfers using TTB achieved.    Patient continues to demonstrate the following deficits: muscle weakness, decreased cardiorespiratoy endurance, decreased problem solving, decreased safety awareness and delayed processing and decreased standing balance and decreased balance strategies and therefore will continue to benefit from skilled OT intervention to enhance overall performance with BADL.  Patient progressing toward long term goals..  Continue plan of care.  OT Short Term Goals Week 2: OT Short Term Goal 1 (Week 2): Pt will demonstrate improved attention and initiation to bathe UB with mod A. OT Short Term Goal 1 - Progress (Week 2): Met OT Short Term Goal 2 (Week 2): Pt will demonstrate improved attention to R side to doff/ don shirt over R arm with mod A. OT Short Term Goal 2 - Progress (Week 2): Met OT Short  Term Goal 3 (Week 2): when in stedy lift, pt will be able to achieve upright stand with mod A of 1 during LB cleansing/dressing. OT Short Term Goal 3 - Progress (Week 2): Met OT Short Term Goal 4 (Week 2): Pt will demonstrate improved attention to R arm with performing self ROM with mod cues and  min A. OT Short Term Goal 4 - Progress (Week 2): Met Week 3: OT Short Term Goal 1 (Week 3): STGs=LTGs due to ELOS  Skilled Therapeutic Interventions/Progress Updates:    Pt supine in bed, no c/o pain, agreeable to OT session.  Pts dtr present during session, she reports pt fell with nursing last night.  OT discussed with dtr the use of BSC at bedside at night to prevent possible falls. Pts dtr reports good understanding.  Pt completed supine to sit with supervision.  Pt donned shoes with min assist due to swelling BLE.  Pt ambulated to toilet transfer using grab bars with CGA.  Pt had continence of urine episode.  Pt completed toileting with CGA.  Pt ambulated to sink to complete UB/LB bathing and dressing per pt request.  Pt initiated turning water on, doffing shirt, and washing UB without VCs. Pt did need min VCs for problem solving when bottle of soap fell into sink, in order to retrieve and place at sink top.  Pt needing max assist to doff TED hose.  Pt able to doff pants and brief with CGA.  LB bathing completed with min assist for bilateral feet.  Pt donned shirt with setup and donned brief and pants with min assist.  Pt completed SPT w/c to EOB and sit to supine with CGA.  Call bell in reach, bed alarm on.    Therapy Documentation Precautions:  Precautions Precautions: Fall Restrictions Weight Bearing Restrictions: No Other Position/Activity Restrictions: NPO with NGT. B soft rest restraints.   Therapy/Group: Individual Therapy  Ezekiel Slocumb 08/14/2020, 12:56 PM

## 2020-08-14 NOTE — Progress Notes (Signed)
Nutrition Follow-up  DOCUMENTATION CODES:   Not applicable  INTERVENTION:   - Continue Ensure Enlive po BID, each supplement provides 350 kcal and 20 grams of protein  - Magic cup TID with meals, each supplement provides 290 kcal and 9 grams of protein (change to vanilla flavor)  - Continue "with assist" for meal ordering  NUTRITION DIAGNOSIS:   Inadequate oral intake related to inability to eat as evidenced by NPO status.  Progressing, pt now on dysphagia 3 diet with thin liquids  GOAL:   Patient will meet greater than or equal to 90% of their needs  Progressing  MONITOR:   PO intake, Supplement acceptance, Diet advancement, Labs, Weight trends, Skin  REASON FOR ASSESSMENT:   Consult Enteral/tube feeding initiation and management  ASSESSMENT:   Lori Bridges is an 86 y.o. female with history of atrial fibrillation on coumadin, hypertension, hyperlipidemia, hypothyroidism and chronic kidney disease. Admitted with [facility].  7/29 -Cortrak tube removed 7/30 - NGT placed at bedside,placement confirmed by KUB, later replaced with Cortrak 8/04 - pt pulled Cortrak,Cortrak replaced (tip gastricper x-ray), diet advanced to dysphagia 1 with thin liquids 8/05 - transitioned to nocturnal tube feeds 8/07 - nocturnal tube feeding rate reduced 8/09 - diet advanced to dysphagia 2 with thin liquids, nocturnal TF d/c and Cortrak removed 8/12 - diet advanced to dysphagia 3 with thin liquids  Noted target d/c date of 08/16/20.  Spoke with pt and daughter at bedside. Pt looking forward to earlier d/c date.  Pt reports appetite remains good. Pt completed ~50% of lunch meal and states that she did not like the grilled cheese because it was hard. Pt no longer wants the orange cream Magic Cups and would like to try the vanilla flavor. Pt drinking Ensure Enlive on and off.  Admit weight: 79.2 kg Current weight: 92.1 kg  Meal Completion: 40-100% x last 8 meals (averaging  68%)  Medications reviewed and include: Ensure Enlive BID, Lasix, protonix, senna  Labs reviewed: BUN 26, creatinine 1.13  Diet Order:   Diet Order            DIET DYS 3 Room service appropriate? Yes; Fluid consistency: Thin  Diet effective now                 EDUCATION NEEDS:   No education needs have been identified at this time  Skin:  Skin Assessment: Skin Integrity Issues: Other: skin tear to left thigh, open wound to perineum  Last BM:  08/13/20 medium type 5  Height:   Ht Readings from Last 1 Encounters:  07/26/20 5\' 6"  (1.676 m)    Weight:   Wt Readings from Last 1 Encounters:  08/14/20 92.1 kg    Ideal Body Weight:  59.1 kg  BMI:  Body mass index is 32.77 kg/m.  Estimated Nutritional Needs:   Kcal:  1550-1750  Protein:  80-95 grams  Fluid:  > 1.5 L    Gaynell Face, MS, RD, LDN Inpatient Clinical Dietitian Please see [facility] for contact information.

## 2020-08-14 NOTE — Progress Notes (Signed)
Physical Therapy Session Note  Patient Details  Name: Lori Bridges MRN: [id number] Date of Birth: March 11, 1930  Today's Date: 08/14/2020 PT Individual Time: 7517-0017 PT Individual Time Calculation (min): 43 min   Short Term Goals: Week 3:  PT Short Term Goal 1 (Week 3): STG = LTG due to ELOS  Skilled Therapeutic Interventions/Progress Updates:    Pt received supine in bed, daughter at bedside, pt agreeable to PT session. Of note, pt experienced an unwitnessed fall last night with NT, imaging negative for acute fx's or changes. Pt reports her sock was halfway off and caused her to slip, encouraged pt and daughter for her to wear her shoes when OOB to assist with stability and balance. Donned shoes and TED hose with totalA while pt supine for time management. Pt performed supine<>sit with supervision with HOB elevated and SPT from EOB to w/c with minA and no AD, cues for sequencing and technique. Pt performed sit<>stand from w/c to bedroom sink with CGA and no AD, maintained standing with CGA at sink while she brushed her dentures. She was able to manage toothbrush with RUE while maintaining LUE grasp with dentures. Pt able to don/doff toothpaste cap without issue using BUE's. Pt transported to hallway in w/c with totalA for time management and energy conservation. Pt ambulated ~168ft with CGA/minA and RW, demo's decreased B step length/height and grossly reduced gait speed. Cues for increasing step length, improving forward gaze, and cues for increasing proximity to AD. After seated rest, pt transported to stairs where she negotiated up/down x4 steps with minA and B HR support, cues for sequencing and technique with increased difficulty during ascent > descent. Attempted to encourage pt to attempt 2nd effort for an additional 4 steps however pt reports moderate fatigue and requests to return to room to sleep. Pt ended session, bed in fowler's position, BLE elevated for edema, daughter at bedside, rails  up, bed alarm on.   Therapy Documentation Precautions:  Precautions Precautions: Fall Restrictions Weight Bearing Restrictions: No Other Position/Activity Restrictions: NPO with NGT. B soft rest restraints. Vital Signs: Therapy Vitals Temp: 98.8 F (37.1 C) Temp Source: Oral Pulse Rate: 90 Resp: 18 BP: 109/62 Patient Position (if appropriate): Sitting Oxygen Therapy SpO2: 97 % O2 Device: Room Air Pain: Pain Assessment Pain Scale: Faces Faces Pain Scale: No hurt  Therapy/Group: Individual Therapy   Lori Bridges PT 08/14/2020, 12:05 PM

## 2020-08-14 NOTE — Progress Notes (Signed)
[facility] PHYSICAL MEDICINE & REHABILITATION PROGRESS NOTE  Subjective/Complaints: Patient seen sitting up in bed this AM.  She did not sleep well overnight due to having a fall.  She states she fell on her bottom.  Daughter at bedside, concerned/upset regarding staffing issues.   ROS: Denies CP, shortness of breath, nausea, vomiting, diarrhea.  Objective: Vital Signs: Blood pressure 108/71, pulse 77, temperature 98.5 F (36.9 C), temperature source Oral, resp. rate 16, height 5\' 6"  (1.676 m), weight 92.1 kg, SpO2 97 %. DG Shoulder 1V Left  Result Date: 08/14/2020 CLINICAL DATA:  Fall today. EXAM: LEFT SHOULDER COMPARISON:  None. FINDINGS: Single frontal view of the left shoulder obtained. No evidence of fracture or dislocation. The acromioclavicular joint is congruent. Glenohumeral joint is grossly normal on the single view. Soft tissues are unremarkable. IMPRESSION: Single frontal view of the left shoulder without evidence of acute fracture or dislocation. Electronically Signed   By: Keith Rake M.D.   On: 08/14/2020 00:36   CT HEAD WO CONTRAST  Result Date: 08/14/2020 CLINICAL DATA:  Fall.  Left spastic hemiparesis and global aphasia. EXAM: CT HEAD WITHOUT CONTRAST TECHNIQUE: Contiguous axial images were obtained from the base of the skull through the vertex without intravenous contrast. COMPARISON:  07/23/2020 FINDINGS: Brain: There is hypoattenuation within the superior left frontal lobe at the site of recent hemorrhage. No residual blood products identified. No midline shift or other mass effect. There is periventricular hypoattenuation compatible with chronic microvascular disease. Vascular: No hyperdense vessel or unexpected calcification. Skull: Normal. Negative for fracture or focal lesion. Sinuses/Orbits: No acute finding. Other: None. IMPRESSION: 1. Hypoattenuation within the superior left frontal lobe at the site of recent hemorrhage. No acute findings. 2. Chronic microvascular  ischemia. Electronically Signed   By: Ulyses Jarred M.D.   On: 08/14/2020 00:36   No results for input(s): WBC, HGB, HCT, PLT in the last 72 hours. Recent Labs    08/13/20 0505 08/14/20 0645  NA 139 138  K 4.1 4.0  CL 105 104  CO2 26 26  GLUCOSE 79 88  BUN 27* 26*  CREATININE 1.21* 1.13*  CALCIUM 10.0 9.8    Physical Exam: BP 108/71 (BP Location: Right Arm)   Pulse 77   Temp 98.5 F (36.9 C) (Oral)   Resp 16   Ht 5\' 6"  (1.676 m)   Wt 92.1 kg   SpO2 97%   BMI 32.77 kg/m  Constitutional: No distress . Vital signs reviewed. HENT: Normocephalic.  Atraumatic. Eyes: EOMI. No discharge. Cardiovascular: No JVD.  Irregularly irregular. Respiratory: Normal effort.  No stridor.  Bilaterally clear to auscultation. GI: Non-distended.  BS +. Skin: Warm and dry.  Intact. Psych: Delayed, improving Musc: Bilateral lower extremity edema. Neuro: Alert and oriented x2 Motor: Somewhat limited due to participation, but appears to be >4/5 throughout, ?weaker in RLE, stable  Assessment/Plan: 1. 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  Physiatrist is providing close team supervision and 24 hour management of active medical problems listed below.  Physiatrist and rehab team continue to assess barriers to discharge/monitor patient progress toward functional and medical goals  Care Tool:  Bathing  Bathing activity did not occur: Safety/medical concerns (did not occur on eval, as pt was not able to be aroused awake) Body parts bathed by patient: Right arm, Left arm, Chest,  Abdomen, Face, Front perineal area, Buttocks, Right upper leg, Left upper leg, Left lower leg, Right lower leg   Body parts bathed by helper: Right lower leg Body parts n/a: Front perineal area, Buttocks, Right  upper leg, Left upper leg, Right lower leg, Left lower leg   Bathing assist Assist Level: Contact Guard/Touching assist     Upper Body Dressing/Undressing Upper body dressing Upper body dressing/undressing activity did not occur (including orthotics):  (did not occur on eval, as pt was not able to be aroused awake) What is the patient wearing?: Button up shirt    Upper body assist Assist Level: Set up assist    Lower Body Dressing/Undressing Lower body dressing      What is the patient wearing?: Incontinence brief, Pants     Lower body assist Assist for lower body dressing: Minimal Assistance - Patient > 75%     Toileting Toileting    Toileting assist Assist for toileting: Minimal Assistance - Patient > 75%     Transfers Chair/bed transfer  Transfers assist  Chair/bed transfer activity did not occur: Safety/medical concerns  Chair/bed transfer assist level: Contact Guard/Touching assist     Locomotion Ambulation   Ambulation assist   Ambulation activity did not occur: Safety/medical concerns  Assist level: Contact Guard/Touching assist Assistive device: Walker-rolling Max distance: 100 ft   Walk 10 feet activity   Assist  Walk 10 feet activity did not occur: Safety/medical concerns  Assist level: Contact Guard/Touching assist Assistive device: Walker-rolling   Walk 50 feet activity   Assist Walk 50 feet with 2 turns activity did not occur: Safety/medical concerns  Assist level: Contact Guard/Touching assist Assistive device: Walker-rolling    Walk 150 feet activity   Assist Walk 150 feet activity did not occur: Safety/medical concerns         Walk 10 feet on uneven surface  activity   Assist Walk 10 feet on uneven surfaces activity did not occur: Safety/medical concerns   Assist level: Minimal Assistance - Patient > 75% Assistive device: Aeronautical engineer Will patient use wheelchair at discharge?:  Yes Type of Wheelchair: Manual Wheelchair activity did not occur: Refused (fatigued at end of session)         Wheelchair 50 feet with 2 turns activity    Assist    Wheelchair 50 feet with 2 turns activity did not occur: Refused       Wheelchair 150 feet activity     Assist Wheelchair 150 feet activity did not occur: Refused          Medical Problem List and Plan: 1.    Left spastic hemiparesis, global aphasia secondary to small left frontal medial ICH and left temporal occipital ICH in the setting of chronic Coumadin/likely hypertensive  Continue CIR  WHO/PRAFO nightly 2.  Antithrombotics: -DVT/anticoagulation: Subcutaneous heparin for DVT prophylaxis initiated July 25, 2020  CBC ordered for tomorrow             -antiplatelet therapy: N/A 3. Pain Management: Tylenol as needed  Controlled 8/17 4. Mood: D/c Ativan due to lethargy.              -antipsychotic agents: N/A 5. Neuropsych: This patient is not capable of making decisions on her own behalf. 6. Fluids/Electrolytes/Nutrition: Routine in and outs. 8.  Seizure prophylaxis.    Keppra 1000 mg twice daily, decreased to 500 twice daily on 8/10, decreased to 250 twice daily on 8/17  EEG negative 9.  Atrial fibrillation.  Chronic Coumadin reversed.  Cardiac rate controlled. Continue Lopressor 75 mg twice daily  Rate controlled on 8/17 10. Post stroke dysphagia.  Advanced to D3 thins  D/ced nasogastric tube feeds.    Dietary follow-up  Advance diet as tolerated 11.  Hypertension.  Lasix 20 mg daily, Avapro 150 mg daily, Toprol XL 100 mg daily currently on hold.    Continue hydralazine 25 mg twice daily, decreased to daily on 8/14, DC'd on 8/15  Continue Lopressor 75 BID  Resume as needed  Remain soft on 8/17 12.  Hypothyroidism.  Synthroid 13.  Hyperlipidemia.  Statin on hold in the setting of ICH 14.  UTI/E. coli.  Completed course of Rocephin 15.  GERD.  Continue Protonix  16.  Hyponatremia  Sodium 138  on 8/17 17.  Thrombocytopenia: Resolved  Platelets 158 on 8/6 18.  Leukopenia  Resolved 19.  Hyperkalemia  Potassium 4.09/17  Lokelma ordered x3 previously 20.  CKD III  Creatinine 1.13 on 8/17  Encourage fluids 21.?  Sleep disturbance-per daughter  Melatonin ordered  Improving 22.  Peripheral edema  Lasix 20 daily started on 8/12, increased to twice daily on 8/13  LOS: 19 days A FACE TO FACE EVALUATION WAS PERFORMED  Naleigha Raimondi [name] 08/14/2020, 9:20 AM

## 2020-08-14 NOTE — Progress Notes (Signed)
Pt arrived back onto unit

## 2020-08-14 NOTE — Discharge Summary (Addendum)
Physician Discharge Summary  Patient ID: Lori Bridges MRN: [id number] DOB/AGE: 84/05/1930 84 y.o.  Admit date: 07/26/2020 Discharge date: 08/16/2020  Discharge Diagnoses:  Principal Problem:   ICH (intracerebral hemorrhage) ([place]) - 2 small L brain ICH, likely d/t HTN on subtherapeutic warfarin Active Problems:   Benign essential HTN   Dysphagia, post-stroke   PAF (paroxysmal atrial fibrillation) (HCC)   Seizure prophylaxis   Gastroesophageal reflux disease   Atrial fibrillation (HCC)   Thrombocytopenia (HCC)   Hypernatremia   Leukopenia   Hyperkalemia   Hyponatremia   Sleep disturbance   Peripheral edema   Fall DVT prophylaxis Hypothyroidism  Discharged Condition: Stable  Significant Diagnostic Studies: CT ANGIO HEAD W OR WO CONTRAST  Result Date: 07/23/2020 CLINICAL DATA:  Follow-up hemorrhage EXAM: CT ANGIOGRAPHY HEAD TECHNIQUE: Multidetector CT imaging of the head was performed using the standard protocol during bolus administration of intravenous contrast. Multiplanar CT image reconstructions and MIPs were obtained to evaluate the vascular anatomy. CONTRAST:  56mL OMNIPAQUE IOHEXOL 350 MG/ML SOLN COMPARISON:  07/22/2020 FINDINGS: CT HEAD Brain: Slight interval contraction of the hematoma in the medial left parietal lobe, measuring 23 x 24 x 18 mm compared with 25 x 23 x 19 mm yesterday. Amount of adjacent vasogenic edema appears similar. Small amount of adjacent subarachnoid blood is slightly reduced. No new brain abnormality. Atrophy and chronic small-vessel ischemic change of the white matter elsewhere. No hydrocephalus. Vascular: There is atherosclerotic calcification of the major vessels at the base of the brain. Skull: Negative Sinuses: Clear Orbits: Normal CTA HEAD Anterior circulation: Both internal carotid arteries are patent through the skull base and siphon regions. Ordinary siphon atherosclerotic calcification with maximal stenosis estimated at 30%. The anterior and  middle cerebral vessels are patent. Both anterior cerebral is receive most of there supply from the left carotid circulation. Flow is present in the anterior and middle cerebral arteries. No sign of vascular malformation in the region of the left parietal hemorrhage. Additional intraparenchymal hemorrhage in the inferolateral left temporal lobe measuring maximally 11 mm. Minimal surrounding edema. No sign of enhancement or abnormal vessels in that region. Posterior circulation: Both vertebral arteries are patent to the basilar. No basilar stenosis. Posterior circulation branch vessels show flow. Venous sinuses: Early venous phase imaging does not allow accurate venography. I do not see any finding suggesting venous thrombosis, but this study does not represent a true CT venogram. Anatomic variants: None significant. IMPRESSION: Slight contraction of the left parietal hematoma and adjacent subarachnoid blood. No increase in edema or mass effect. No evidence of enhancing mass or high flow vascular malformation. Diminishing density of a hemorrhage in the left inferolateral temporal lobe, maximal dimension 11 mm. Very small amount of adjacent subarachnoid blood. No enhancement in this region. No new brain finding. Study does not serve as a true CT venogram. No positive evidence of venous thrombosis. This pattern of disease suggests that these may be post traumatic hemorrhages. Electronically Signed   By: Nelson Chimes M.D.   On: 07/23/2020 14:11   DG Shoulder 1V Left  Result Date: 08/14/2020 CLINICAL DATA:  Fall today. EXAM: LEFT SHOULDER COMPARISON:  None. FINDINGS: Single frontal view of the left shoulder obtained. No evidence of fracture or dislocation. The acromioclavicular joint is congruent. Glenohumeral joint is grossly normal on the single view. Soft tissues are unremarkable. IMPRESSION: Single frontal view of the left shoulder without evidence of acute fracture or dislocation. Electronically Signed   By:  Keith Rake M.D.   On:  08/14/2020 00:36   CT HEAD WO CONTRAST  Result Date: 08/14/2020 CLINICAL DATA:  Fall.  Left spastic hemiparesis and global aphasia. EXAM: CT HEAD WITHOUT CONTRAST TECHNIQUE: Contiguous axial images were obtained from the base of the skull through the vertex without intravenous contrast. COMPARISON:  07/23/2020 FINDINGS: Brain: There is hypoattenuation within the superior left frontal lobe at the site of recent hemorrhage. No residual blood products identified. No midline shift or other mass effect. There is periventricular hypoattenuation compatible with chronic microvascular disease. Vascular: No hyperdense vessel or unexpected calcification. Skull: Normal. Negative for fracture or focal lesion. Sinuses/Orbits: No acute finding. Other: None. IMPRESSION: 1. Hypoattenuation within the superior left frontal lobe at the site of recent hemorrhage. No acute findings. 2. Chronic microvascular ischemia. Electronically Signed   By: Ulyses Jarred M.D.   On: 08/14/2020 00:36   CT HEAD WO CONTRAST  Result Date: 07/22/2020 CLINICAL DATA:  Found at 1340 hours by daughter leaning against wall between the bed and a dresser, aphasic, question stroke; history pulmonary hypertension, atrial fibrillation, hypertension, stage III chronic kidney disease, CHF EXAM: CT HEAD WITHOUT CONTRAST TECHNIQUE: Contiguous axial images were obtained from the base of the skull through the vertex without intravenous contrast. Sagittal and coronal MPR images reconstructed from axial data set. COMPARISON:  None FINDINGS: Brain: Generalized atrophy. Normal ventricular morphology. High attenuation acute intraparenchymal hemorrhage identified at LEFT vertex 2.2 x 1.7 x 2.4 cm in size with surrounding vasogenic edema. Extra-axial extension of hemorrhage along the falx and medial sulci to LEFT vertex. No midline shift. Additional intraparenchymal hemorrhage at LEFT temporal lobe 11 x 6 x 10 mm. These could be due to  hemorrhagic metastases, less likely hemorrhagic infarct. No additional infarcts, mass, or hemorrhage identified. Vascular: No hyperdense vessels Skull: Intact Sinuses/Orbits: Clear Other: N/A IMPRESSION: Acute intraparenchymal hemorrhage at LEFT vertex 2.2 x 1.7 x 2.4 cm in size with surrounding vasogenic edema. Extra-axial extension of hemorrhage along the falx and interhemispheric fissure. Additional intraparenchymal hemorrhage at LEFT temporal lobe 11 x 6 x 10 mm. Findings are concerning for hemorrhagic metastases and further assessment by MR imaging with and without contrast recommended. No midline shift or hydrocephalus. Critical Value/emergent results were called by telephone at the time of interpretation on 07/22/2020 at 5:28 pm to provider [facility] , who verbally acknowledged these results. Electronically Signed   By: Lavonia Dana M.D.   On: 07/22/2020 17:30   MR BRAIN W WO CONTRAST  Result Date: 07/22/2020 CLINICAL DATA:  Multifocal intracranial hemorrhage EXAM: MRI HEAD WITHOUT AND WITH CONTRAST TECHNIQUE: Multiplanar, multiecho pulse sequences of the brain and surrounding structures were obtained without and with intravenous contrast. CONTRAST:  22mL GADAVIST GADOBUTROL 1 MMOL/ML IV SOLN COMPARISON:  Head CT 07/22/2020 Brain MRI 04/30/2012 FINDINGS: Brain: Unchanged intraparenchymal hemorrhage in the left parietal lobe and left temporal lobe. Mild edema at both locations, greater in the parietal lobe. No acute ischemia. No midline shift or other mass effect. Multifocal white matter hyperintensity, most commonly due to chronic ischemic microangiopathy. No hydrocephalus. There is a small amount of subarachnoid blood over the left temporal convexity. No chronic microhemorrhage. Normal midline structures. Postcontrast images are motion degraded there is no appreciable contrast enhancement at the sites of hemorrhage. Vascular: Normal flow voids. Skull and upper cervical spine: Normal marrow signal.  Sinuses/Orbits: Negative. Other: None. IMPRESSION: 1. Unchanged intraparenchymal hemorrhage in the left parietal lobe and left temporal lobe with mild edema. 2. No abnormal contrast enhancement at either hemorrhage site to indicate  the presence of a mass. Electronically Signed   By: Ulyses Jarred M.D.   On: 07/22/2020 21:49   DG Abd Portable 1V  Result Date: 08/01/2020 CLINICAL DATA:  Feeding tube placement EXAM: PORTABLE ABDOMEN - 1 VIEW COMPARISON:  July 27, 2020 FINDINGS: Nasogastric tube has been removed. Feeding tube tip is in the region of the distal stomach. There is no bowel dilatation or air-fluid level to suggest bowel obstruction. No free air. There is a focal calcification in the right mid abdomen measuring 5 x 5 mm. IMPRESSION: Feeding tube tip in distal stomach. No bowel obstruction or free air. 5 mm calcification in right pelvis, potentially within the right kidney. Electronically Signed   By: Lowella Grip III M.D.   On: 08/01/2020 12:44   DG Abd Portable 1V  Result Date: 07/27/2020 CLINICAL DATA:  Check gastric catheter placement EXAM: PORTABLE ABDOMEN - 1 VIEW COMPARISON:  None. FINDINGS: Gastric catheter is noted with the tip in the distal stomach. Scattered large and small bowel gas is noted. Degenerative changes of the lumbar spine are seen. IMPRESSION: Gastric catheter within the distal aspect of the stomach. Electronically Signed   By: Inez Catalina M.D.   On: 07/27/2020 03:41   DG Abd Portable 1V  Result Date: 07/27/2020 CLINICAL DATA:  NG tube placement EXAM: PORTABLE ABDOMEN - 1 VIEW COMPARISON:  None. FINDINGS: Tip the NG tube is seen within the distal stomach. Air seen in nondilated loops of bowel. IMPRESSION: Tip the NG tube within the distal stomach. Electronically Signed   By: Prudencio Pair M.D.   On: 07/27/2020 00:08   EEG adult  Result Date: 07/24/2020 Lora Havens, MD     07/25/2020  2:01 PM Patient Name: Lori Bridges  MRN: [id number]  Epilepsy Attending:  Lora Havens  Referring Physician/Provider: Dr Rosalin Hawking Date: 07/24/2020 Duration: 23.40 mins   Patient history: 86 y/o F on Warfarin p/w L frontomedial ICH and another smaller L temporooccipital ICH with associated vasogenic edema.On exam noted to have R leg and arm twitching, Unable to abduct R eye and adduct Left eye. EEG to evaluate for seizure   Level of alertness: lethargic/ asleep   AEDs during EEG study: Keppra, ativan   Technical aspects: This EEG study was done with scalp electrodes positioned according to the 10-20 International system of electrode placement. Electrical activity was acquired at a sampling rate of 500Hz  and reviewed with a high frequency filter of 70Hz  and a low frequency filter of 1Hz . EEG data were recorded continuously and digitally stored.   Description: No posterior dominant rhythm was seen.   Sleep was characterized by vertex waves, sleep spindles (12 to 14 Hz), maximal frontocentral region.  EEG showed continuous generalized and maximal left parieto-occipital region 3 to 6 Hz theta-delta slowing.  Single sharp transient was seen in left posterior temporal region.  Hyperventilation and photic stimulation were not performed.   ABNORMALITY -Continuous slow, generalized and maximal left parieto-occipital region   IMPRESSION: This study is suggestive of cortical dysfunction in left parietal- occipital region likely secondary to underlying hemorrhage as well as moderate diffuse encephalopathy, nonspecific etiology but likely related to sedation, post-ictal state. No seizures or definite epileptiform discharges were seen throughout the recording.     Lora Havens   EEG adult  Result Date: 07/22/2020 Lora Havens, MD     07/23/2020  9:06 AM Patient Name: Lori Bridges MRN: [id number] Epilepsy Attending: Lora Havens Referring Physician/Provider: Dr Alferd Patee  Lorrin Goodell Date: 07/22/2020 Duration: 24.02 mins Patient history: 86 y/o F on Warfarin p/w L frontomedial ICH and  another smaller L temporooccipital ICH with associated vasogenic edema. On exam noted to have R leg and arm twitching, Unable to abduct R eye and adduct Left eye. EEG to evaluate for seizure Level of alertness: lethargic AEDs during EEG study: Keppra, ativan Technical aspects: This EEG study was done with scalp electrodes positioned according to the 10-20 International system of electrode placement. Electrical activity was acquired at a sampling rate of 500Hz  and reviewed with a high frequency filter of 70Hz  and a low frequency filter of 1Hz . EEG data were recorded continuously and digitally stored. Description: No posterior dominant rhythm was seen. EEG showed continuous generalized 3 to 6 Hz theta-delta slowing admixed with15 to 18 Hz beta activity with irregular morphology distributed symmetrically and diffusely.  Hyperventilation and photic stimulation were not performed.   ABNORMALITY -Excessive beta, generalized -Continuous slow, generalized IMPRESSION: This study is suggestive of moderate diffuse encephalopathy, nonspecific etiology but likely related to sedation, post-ictal state. The excessive beta activity seen in the background is most likely due to the effect of benzodiazepine and is a benign EEG pattern. No seizures or definite epileptiform discharges were seen throughout the recording. Lori [name]   Overnight EEG with video  Result Date: 07/25/2020 Lora Havens, MD     07/25/2020  2:00 PM Patient Name: Lori Bridges  MRN: [id number]  Epilepsy Attending: Lora Havens  Referring Physician/Provider: Dr Rosalin Hawking Duration: 07/24/2020 1119 to 07/25/2020 1124   Patient history: 86 y/o F on Warfarin p/w L frontomedial ICH and another smaller L temporooccipital ICH with associated vasogenic edema.has intermittent but frequent episodes of right facial twitching, right upper extremity tonic extension for by rhythmic jerking.  EEG to evaluate for seizure   Level of alertness: lethargic/ asleep    AEDs during EEG study: Keppra, ativan   Technical aspects: This EEG study was done with scalp electrodes positioned according to the 10-20 International system of electrode placement. Electrical activity was acquired at a sampling rate of 500Hz  and reviewed with a high frequency filter of 70Hz  and a low frequency filter of 1Hz . EEG data were recorded continuously and digitally stored.   Description: No posterior dominant rhythm was seen. Sleep was characterized by vertex waves, sleep spindles (12 to 14 Hz), maximal frontocentral region.  EEG showed continuous generalized and maximal left parieto-occipital region 3 to 6 Hz theta-delta slowing.  When patient was awake or stimulated, EEG showed rhythmic 2 to 5 Hz theta-delta slowing in left hemisphere, maximal left frontocentral region without any evolution, Sharp waves were seen in left frontal region.  Hyperventilation and photic stimulation were not performed.   ABNORMALITY - Sharp waves, left frontal region -Continuous slow, generalized and maximal left parieto-occipital region   IMPRESSION: This study is suggestive of epileptogenicity in left frontal region as well as cortical dysfunction in left parieto- occipital region likely secondary to underlying hemorrhage. Additionally, there is evidence of moderate diffuse encephalopathy, nonspecific etiology but likely related to sedation, post-ictal state. No seizures or definite epileptiform discharges were seen throughout the recording.    Lori [name]   Overnight EEG with video  Result Date: 07/23/2020 Lora Havens, MD     07/25/2020 11:02 AM Patient Name: Lori Bridges MRN: [id number] Epilepsy Attending: Lora Havens Referring Physician/Provider: Dr Donnetta Simpers Duration: 07/22/2020 2303 to 07/23/2020 1054   Patient history: 86 y/o F on Warfarin p/w  L frontomedial ICH and another smaller L temporooccipital ICH with associated vasogenic edema.On exam noted to have R leg and arm twitching, Unable to  abduct R eye and adduct Left eye. EEG to evaluate for seizure   Level of alertness: lethargic, asleep   AEDs during EEG study: Keppra, ativan   Technical aspects: This EEG study was done with scalp electrodes positioned according to the 10-20 International system of electrode placement. Electrical activity was acquired at a sampling rate of 500Hz  and reviewed with a high frequency filter of 70Hz  and a low frequency filter of 1Hz . EEG data were recorded continuously and digitally stored.   Description: No posterior dominant rhythm was seen.   Sleep was characterized by vertex waves, sleep spindles (12 to 14 Hz), maximal frontocentral region.  EEG showed continuous generalized 3 to 6 Hz theta-delta slowing admixed with15 to 18 Hz beta activity with irregular morphology distributed symmetrically and diffusely.  Hyperventilation and photic stimulation were not performed.   ABNORMALITY -Excessive beta, generalized -Continuous slow, generalized   IMPRESSION: This study is suggestive of moderate diffuse encephalopathy, nonspecific etiology but likely related to sedation, post-ictal state. The excessive beta activity seen in the background is most likely due to the effect of benzodiazepine and is a benign EEG pattern. No seizures or definite epileptiform discharges were seen throughout the recording.     Lori Bridges    VAS US CAROTID  Result Date: 07/23/2020 Carotid Arterial Duplex Study Indications:       CVA. Risk Factors:      Hypertension, hyperlipidemia. Limitations        Today's exam was limited due to the patient's respiratory                    variation and patient positioning. Comparison Study:  no prior Performing Technologist: Abram Sander RVS  Examination Guidelines: A complete evaluation includes B-mode imaging, spectral Doppler, color Doppler, and power Doppler as needed of all accessible portions of each vessel. Bilateral testing is considered an integral part of a complete examination. Limited  examinations for reoccurring indications may be performed as noted.  Right Carotid Findings: +----------+--------+--------+--------+------------------+--------------+           PSV cm/sEDV cm/sStenosisPlaque DescriptionComments       +----------+--------+--------+--------+------------------+--------------+ CCA Prox  34      7               heterogenous                     +----------+--------+--------+--------+------------------+--------------+ CCA Distal26      4               heterogenous                     +----------+--------+--------+--------+------------------+--------------+ ICA Prox  27      6       1-39%   heterogenous                     +----------+--------+--------+--------+------------------+--------------+ ICA Distal33      8                                                +----------+--------+--------+--------+------------------+--------------+ ECA  Not visualized +----------+--------+--------+--------+------------------+--------------+ +----------+--------+-------+--------+-------------------+           PSV cm/sEDV cmsDescribeArm Pressure (mmHG) +----------+--------+-------+--------+-------------------+ [id number]                                         +----------+--------+-------+--------+-------------------+ +---------+--------+--------+--------------+ VertebralPSV cm/sEDV cm/sNot identified +---------+--------+--------+--------------+  Left Carotid Findings: +----------+--------+--------+--------+------------------+--------+           PSV cm/sEDV cm/sStenosisPlaque DescriptionComments +----------+--------+--------+--------+------------------+--------+ CCA Prox  49      9               heterogenous               +----------+--------+--------+--------+------------------+--------+ CCA Distal40      11              heterogenous                +----------+--------+--------+--------+------------------+--------+ ICA Prox  35      9       1-39%   heterogenous               +----------+--------+--------+--------+------------------+--------+ ICA Distal54      14                                         +----------+--------+--------+--------+------------------+--------+ ECA       25                                                 +----------+--------+--------+--------+------------------+--------+ +----------+--------+--------+--------+-------------------+           PSV cm/sEDV cm/sDescribeArm Pressure (mmHG) +----------+--------+--------+--------+-------------------+ [id number]                                          +----------+--------+--------+--------+-------------------+ +---------+--------+--+--------+-+---------+ VertebralPSV cm/s30EDV cm/s8Antegrade +---------+--------+--+--------+-+---------+   Summary: Right Carotid: Velocities in the right ICA are consistent with a 1-39% stenosis. Left Carotid: Velocities in the left ICA are consistent with a 1-39% stenosis. Vertebrals: Left vertebral artery demonstrates antegrade flow. Right vertebral             artery was not visualized. *See table(s) above for measurements and observations.  Electronically signed by Antony Contras MD on 07/23/2020 at 12:48:50 PM.    Final     Labs:  Basic Metabolic Panel: Recent Labs  Lab 08/09/20 0636 08/13/20 0505 08/14/20 0645  NA 137 139 138  K 4.4 4.1 4.0  CL 104 105 104  CO2 25 26 26   GLUCOSE 84 79 88  BUN 35* 27* 26*  CREATININE 1.03* 1.21* 1.13*  CALCIUM 10.1 10.0 9.8    CBC: Recent Labs  Lab 08/15/20 0620  WBC 3.5*  NEUTROABS 1.9  HGB 11.6*  HCT 36.3  MCV 96.3  PLT 148*    CBG: No results for input(s): GLUCAP in the last 168 hours.  Family history.  Father with hypertension and peripheral vascular disease mother with diabetes mellitus Brother with CAD.  Denies any colon cancer esophageal cancer or  rectal cancer  Brief HPI:   Lori Bridges is a 86 y.o. right-handed female with history of atrial fibrillation maintained on  Coumadin, hypertension, hyperlipidemia, chronic kidney disease.  Per chart review independent prior to admission with assistive device was able to dress and bathe herself she does have family assist as needed due to some recent decrease in memory.  Presented July 26 of 2021 with increasing confusion as well as slurred speech and subtle hand and forearm twitching.  CT and MRI showed acute intraparenchymal hemorrhage in the left vertex 2 x 2 x 1 0.7 x 2.4 cm in size with surrounding vasogenic edema.  Additional intraparenchymal hemorrhage in the left temporal lobe 11 x 6 x 10 mm.  No midline shift or hydrocephalus.  CT angiogram of the head no evidence of enhancing mass or lesion.  Admission chemistries potassium 3.2 glucose 126 creatinine 1.22 alcohol negative SARS coronavirus negative INR 1.5.  She was loaded with Keppra EEG negative for seizure.  Patient did receive Kcentra vitamin K to reverse Coumadin.  Cardene drip initiated for blood pressure control.  She was cleared to begin subcutaneous heparin for DVT prophylaxis July 25, 2020.  She remained n.p.o. with alternative means of nutritional support.  She was completing a course of Rocephin for E. coli UTI.  Therapy evaluations completed and patient was admitted for a comprehensive rehab program   Hospital Course: Lori Bridges was admitted to rehab 07/26/2020 for inpatient therapies to consist of PT, ST and OT at least three hours five days a week. Past admission physiatrist, therapy team and rehab RN have worked together to provide customized collaborative inpatient rehab.  Pertaining to patient's left frontal medial ICH and left temporal occipital ICH in the setting of chronic Coumadin likely hypertensive she remained off chronic Coumadin for now until follow-up neurology services.  She had been cleared to begin subcutaneous  heparin for DVT prophylaxis July 28 of 2021 no bleeding episodes.  Seizure prophylaxis with Keppra slowly weaned EEG negative.  She did have a history of atrial fibrillation cardiac rate controlled Lopressor as advised no chest pain or shortness of breath.  Her diet was slowly advanced to mechanical soft nasogastric tube feeds discontinued.  Her blood pressure remains well controlled she did remain on Lasix as noted as well as beta-blocker.  GERD with Protonix.  Hypothyroidism Synthroid ongoing.  She was receiving melatonin to help aid in sleep.  Patient did have an assisted fall on the evening of 08/13/2020 cranial CT scan negative for acute findings as well as x-ray of right shoulder showing no fracture or dislocation.  No other injury sustained and family was notified.   Blood pressures were monitored on TID basis and soft and controlled     Rehab course: During patient's stay in rehab weekly team conferences were held to monitor patient's progress, set goals and discuss barriers to discharge. At admission, patient required max assist stand pivot transfers total assist supine to sit and sit to supine.  Max assist lower body dressing max assist grooming  Physical exam.  Blood pressure 132/89 pulse 90 temperature 97.6 respirations 18 oxygen saturation 98% room air General.  Some lethargic arousable HEENT. Head.  Normocephalic and atraumatic Eyes.  Pupils round and reactive to light no discharge without nystagmus Neck.  Supple nontender no JVD without thyromegaly Cardiac regular rate rhythm without any extra sounds or murmur heard Abdomen.  Soft nontender positive bowel sounds without rebound Respiratory effort normal no respiratory distress without wheeze Extremities.  No clubbing cyanosis or edema Skin.  Clean and intact Neuro.  Somewhat lethargic but arousable opens her eyes she would provide some  simple phrases follows simple commands  She  has had improvement in activity tolerance,  balance, postural control as well as ability to compensate for deficits. She has had improvement in functional use RUE/LUE  and RLE/LLE as well as improvement in awareness.  Patient perform supine to sit supervision with head of bed flat stand pivot with edge of bed to wheelchair minimal assist no assistive device ambulates 133 feet contact-guard rolling walker she does have some limited carryover despite cues and demonstration again advising family need for supervision for safety.  Daughter had been assisting patient with bathroom needs as well as lower body dressing.  Patient ambulates to the sink and stand to sit with contact-guard assist doffed button-down shirt with modified independence in bathing upper body with set up.  Doffed pants and briefs bathe lower body with supervision.  Patient created and used a list for recall during task with only supervision.  Full family teaching completed and plan discharge to home       Disposition: Discharge to home    Diet: Mechanical soft  Special Instructions: No driving smoking or alcohol  Continue to hold chronic Coumadin until further notice follow-up neurology services  Medications at discharge. 1.  Tylenol as needed 2.  Lasix 20 mg p.o. twice daily 3.  Keppra 500 mg p.o. twice daily 4.  Synthroid 50 mcg p.o. daily 5.  Melatonin 1.5 mg p.o. nightly 6.  Lopressor 75 mg p.o. twice daily 7.  Protonix 40 mg p.o. daily  30-35 minutes were spent completing discharge summary and discharge planning  Discharge Instructions     Ambulatory referral to Neurology   Complete by: As directed    An appointment is requested in approximately 4 weeks left temporal occipital [place]   Ambulatory referral to Physical Medicine Rehab   Complete by: As directed    Moderate complexity follow-up 1 to 2 weeks left ICH        Follow-up Information     Jamse Arn, MD Follow up.   Specialty: Physical Medicine and Rehabilitation Why: Office to call for  appointment Contact information: [address] [place] [address] [phone]         Sueanne Margarita, MD Follow up.   Specialty: Cardiology Why: Call for appointment Contact information: [address] [place] [address] [phone]                 Signed: Cathlyn Bridges 08/16/2020, 4:42 AM Patient was seen, face-face, and physical exam performed by me on day of discharge, greater than 30 minutes of total time spent.. Please see progress note from day of discharge as well.  Delice Lesch, MD, ABPMR

## 2020-08-14 NOTE — Progress Notes (Signed)
08/13/20 2333  What Happened  Was fall witnessed? Yes  Who witnessed fall? Emai, NT  Patients activity before fall ambulating-assisted  Point of contact arm/shoulder;head  Was patient injured? Yes  Follow Up  MD notified Linna Hoff, [place]  Time MD notified [phone]  Family notified Yes - comment  Time family notified [phone]  Additional tests Yes-comment (CT and X-ray)  Simple treatment Other (comment)  Progress note created (see row info) Yes  Adult Fall Risk Assessment  Risk Factor Category (scoring not indicated) Fall has occurred during this admission (document High fall risk)  Patient Fall Risk Level High fall risk  Adult Fall Risk Interventions  Required Bundle Interventions *See Row Information* High fall risk - low, moderate, and high requirements implemented  Additional Interventions Assess orthostatic BP;Lap belt while in chair/wheelchair;Pharmacy review of medications;PT/OT need assessed if change in mobility from baseline;Use of appropriate toileting equipment (bedpan, BSC, etc.)  Screening for Fall Injury Risk (To be completed on HIGH fall risk patients) - Assessing Need for Low Bed  Risk For Fall Injury- Low Bed Criteria 85 years or older  Will Implement Low Bed and Floor Mats Low bed contraindicated, floor mats in place  Specialty Low Bed Contraindicated Hemodynamically unstable  Screening for Fall Injury Risk (To be completed on HIGH fall risk patients who do not meet crieteria for Low Bed) - Assessing Need for Floor Mats Only  Risk For Fall Injury- Criteria for Floor Mats None identified - No additional interventions needed  Will Implement Floor Mats Yes  Vitals  Temp 98.8 F (37.1 C)  Temp Source Oral  BP 126/83  MAP (mmHg) 97  BP Location Left Arm  BP Method Automatic  Patient Position (if appropriate) Sitting  Pulse Rate 63  Pulse Rate Source Monitor  Resp (!) 24  Oxygen Therapy  SpO2 97 %  O2 Device Room Air  Pain Assessment  Pain Scale 0-10  Pain Score 2  Pain  Location Shoulder  Pain Orientation Right  Pain Descriptors / Indicators Discomfort  Pain Frequency Other (Comment)  Pain Onset Gradual  Patients Stated Pain Goal 0  Pain Intervention(s) Refused  PCA/Epidural/Spinal Assessment  Respiratory Pattern Regular;Unlabored  Neurological  Neuro (WDL) X  Level of Consciousness Alert  Orientation Level Oriented X4  Cognition Follows commands  Speech Clear  R Hand Grip Present  L Hand Grip Present  RUE Motor Response Purposeful movement  RUE Motor Strength 4  LUE Motor Response Purposeful movement  LUE Motor Strength 4  RLE Motor Response Purposeful movement  RLE Motor Strength 4  LLE Motor Response Purposeful movement  LLE Motor Strength 4  Seizure Activity  Psychomotor Symptoms None  Glasgow Coma Scale  Eye Opening 4  Best Verbal Response (NON-intubated) 5  Best Motor Response 6  Glasgow Coma Scale Score 15  Musculoskeletal  Musculoskeletal (WDL) X  Assistive Device Front wheel walker  Generalized Weakness Yes  Weight Bearing Restrictions No  Musculoskeletal Details  RUE Weakness  LUE Full movement  RLE Weakness  LLE Full movement  Integumentary  Integumentary (WDL) X  Skin Color Appropriate for ethnicity  Skin Condition Dry  Skin Integrity Abrasion  Abrasion Location Shoulder  Abrasion Location Orientation Right  Abrasion Intervention Other (Comment) (assessed)  Ecchymosis Location Abdomen  Ecchymosis Location Orientation Bilateral  Ecchymosis Intervention Other (Comment) (assessed)  Skin Turgor Non-tenting  Pain Assessment  Date Pain First Started 08/13/20  Pain Assessment  Work-Related Injury No    Front Left Wheel on the walker, resulting  in pt fall.

## 2020-08-15 ENCOUNTER — Inpatient Hospital Stay (HOSPITAL_COMMUNITY): Payer: Medicare Other | Admitting: Speech Pathology

## 2020-08-15 ENCOUNTER — Inpatient Hospital Stay (HOSPITAL_COMMUNITY): Payer: Medicare Other | Admitting: Occupational Therapy

## 2020-08-15 ENCOUNTER — Inpatient Hospital Stay (HOSPITAL_COMMUNITY): Payer: Medicare Other

## 2020-08-15 LAB — CBC WITH DIFFERENTIAL/PLATELET
Abs Immature Granulocytes: 0.01 10*3/uL (ref 0.00–0.07)
Basophils Absolute: 0 10*3/uL (ref 0.0–0.1)
Basophils Relative: 0 %
Eosinophils Absolute: 0.1 10*3/uL (ref 0.0–0.5)
Eosinophils Relative: 3 %
HCT: 36.3 % (ref 36.0–46.0)
Hemoglobin: 11.6 g/dL — ABNORMAL LOW (ref 12.0–15.0)
Immature Granulocytes: 0 %
Lymphocytes Relative: 27 %
Lymphs Abs: 1 10*3/uL (ref 0.7–4.0)
MCH: 30.8 pg (ref 26.0–34.0)
MCHC: 32 g/dL (ref 30.0–36.0)
MCV: 96.3 fL (ref 80.0–100.0)
Monocytes Absolute: 0.5 10*3/uL (ref 0.1–1.0)
Monocytes Relative: 15 %
Neutro Abs: 1.9 10*3/uL (ref 1.7–7.7)
Neutrophils Relative %: 55 %
Platelets: 148 10*3/uL — ABNORMAL LOW (ref 150–400)
RBC: 3.77 MIL/uL — ABNORMAL LOW (ref 3.87–5.11)
RDW: 18.6 % — ABNORMAL HIGH (ref 11.5–15.5)
WBC: 3.5 10*3/uL — ABNORMAL LOW (ref 4.0–10.5)
nRBC: 0 % (ref 0.0–0.2)

## 2020-08-15 MED ORDER — LEVOTHYROXINE SODIUM 50 MCG PO TABS: 50.0000 ug | ORAL_TABLET | Freq: Every day | ORAL | 0 refills | Status: DC

## 2020-08-15 MED ORDER — LEVETIRACETAM 250 MG PO TABS
250.0000 mg | ORAL_TABLET | Freq: Two times a day (BID) | ORAL | Status: DC
  Administered 2020-08-15 – 2020-08-16 (×3): 250 mg via ORAL
  Filled 2020-08-15 (×3): qty 1

## 2020-08-15 MED ORDER — ACETAMINOPHEN 325 MG PO TABS: 650.0000 mg | ORAL_TABLET | ORAL | Status: DC | PRN

## 2020-08-15 MED ORDER — PANTOPRAZOLE SODIUM 40 MG PO TBEC
40.0000 mg | DELAYED_RELEASE_TABLET | Freq: Every day | ORAL | Status: DC
  Administered 2020-08-16: 40 mg via ORAL
  Filled 2020-08-15: qty 1

## 2020-08-15 MED ORDER — METOPROLOL TARTRATE 75 MG PO TABS: 75.0000 mg | ORAL_TABLET | Freq: Two times a day (BID) | ORAL | 0 refills | Status: DC

## 2020-08-15 MED ORDER — MELATONIN 300 MCG PO TABS: 1.5000 mg | ORAL_TABLET | Freq: Every day | ORAL | 0 refills | Status: DC

## 2020-08-15 MED ORDER — FUROSEMIDE 40 MG PO TABS
40.0000 mg | ORAL_TABLET | Freq: Two times a day (BID) | ORAL | Status: DC
  Administered 2020-08-15 – 2020-08-16 (×2): 40 mg via ORAL
  Filled 2020-08-15 (×2): qty 1

## 2020-08-15 MED ORDER — LEVETIRACETAM 250 MG PO TABS: 250.0000 mg | ORAL_TABLET | Freq: Two times a day (BID) | ORAL | 0 refills | Status: DC

## 2020-08-15 MED ORDER — PANTOPRAZOLE SODIUM 40 MG PO TBEC
40.0000 mg | DELAYED_RELEASE_TABLET | Freq: Every day | ORAL | 1 refills | Status: DC
Start: 2020-08-15 — End: 2020-10-17

## 2020-08-15 MED ORDER — FUROSEMIDE 20 MG PO TABS: 20.0000 mg | ORAL_TABLET | Freq: Two times a day (BID) | ORAL | 0 refills | Status: DC

## 2020-08-15 NOTE — Progress Notes (Signed)
Occupational Therapy Discharge Summary  Patient Details  Name: Lori Bridges MRN: [id number] Date of Birth: 1930/10/21  Today's Date: 08/15/2020 OT Individual Time: 1455-1535 OT Individual Time Calculation (min): 40 min    Patient has met 18 of 18 long term goals due to improved activity tolerance, improved balance, postural control, ability to compensate for deficits, functional use of  RIGHT upper extremity, improved attention, improved awareness and improved coordination.  Patient to discharge at overall supervision to [place] level.  Patient's care partner is independent to provide the necessary physical and cognitive assistance at discharge.    Reasons goals not met: all goals met  Recommendation:  Patient will benefit from ongoing skilled OT services in home health setting to continue to advance functional skills in the area of BADL.  Equipment: No equipment provided  Reasons for discharge: treatment goals met and discharge from hospital  Patient/family agrees with progress made and goals achieved: Yes  OT Discharge Precautions/Restrictions  Precautions Precautions: Fall Restrictions Weight Bearing Restrictions: No Pain Pain Assessment Pain Scale: 0-10 Pain Score: 0-No pain ADL ADL Eating: Supervision/safety Where Assessed-Eating: Wheelchair Grooming: Setup Where Assessed-Grooming: Sitting at sink Upper Body Bathing: Supervision/safety Where Assessed-Upper Body Bathing: Sitting at sink Lower Body Bathing: Contact guard Where Assessed-Lower Body Bathing: Sitting at sink, Standing at sink Upper Body Dressing: Setup Where Assessed-Upper Body Dressing: Sitting at sink Lower Body Dressing: Minimal assistance Where Assessed-Lower Body Dressing: Standing at sink, Sitting at sink Toileting: Contact guard Where Assessed-Toileting: Bedside Commode Toilet Transfer: Contact guard Toilet Transfer Method: Ambulating Tub/Shower Transfer: Contact guard Tub/Shower  Transfer Method: Ambulating Tub/Shower Equipment: Radio broadcast assistant Vision Baseline Vision/History: Wears glasses Wears Glasses: At all times Vision Assessment?: No apparent visual deficits Perception  Perception: Within Functional Limits Praxis Praxis: Not tested Cognition Overall Cognitive Status: Within Functional Limits for tasks assessed Arousal/Alertness: Awake/alert Orientation Level: Oriented X4 Attention: Sustained Sustained Attention: Appears intact Sustained Attention Impairment: Verbal basic;Functional basic Memory: Impaired Memory Impairment: Decreased recall of new information Awareness: Appears intact Problem Solving: Impaired Problem Solving Impairment: Verbal basic;Functional basic Safety/Judgment: Impaired Sensation Sensation Light Touch: Appears Intact Hot/Cold: Appears Intact Proprioception: Appears Intact Stereognosis: Appears Intact Coordination Gross Motor Movements are Fluid and Coordinated: No Fine Motor Movements are Fluid and Coordinated: Yes Motor  Motor Motor: Within Functional Limits Mobility  Bed Mobility Bed Mobility: Rolling Right;Rolling Left;Supine to Sit;Sit to Supine Rolling Right: Minimal Assistance - Patient > 75% Rolling Left: Minimal Assistance - Patient > 75% Supine to Sit: Supervision/Verbal cueing Sit to Supine: Minimal Assistance - Patient > 75% Transfers Sit to Stand: Contact Guard/Touching assist Stand to Sit: Contact Guard/Touching assist  Trunk/Postural Assessment  Cervical Assessment Cervical Assessment: Within Functional Limits Thoracic Assessment Thoracic Assessment: Exceptions to [facility] (rounded shoulders) Lumbar Assessment Lumbar Assessment: Exceptions to [facility] (posterior pelvic tilt)  Balance Balance Balance Assessed: Yes Dynamic Sitting Balance Sitting balance - Comments: Can maintain sitting EOB or on mat table without BUE support with supervision Extremity/Trunk Assessment RUE Assessment RUE Assessment:  Within Functional Limits LUE Assessment LUE Assessment: Within Functional Limits   Caryl Asp Kristjan Derner 08/15/2020, 4:56 PM

## 2020-08-15 NOTE — Progress Notes (Signed)
[facility] PHYSICAL MEDICINE & REHABILITATION PROGRESS NOTE  Subjective/Complaints: Patient seen sitting up in bed this AM.  She states she slept well overnight. Daughter at bedside.  Both are eager for discharge tomorrow.   ROS: Denies CP, shortness of breath, nausea, vomiting, diarrhea.  Objective: Vital Signs: Blood pressure 111/74, pulse 86, temperature 98 F (36.7 C), resp. rate 14, height 5\' 6"  (1.676 m), weight 92.1 kg, SpO2 96 %. DG Shoulder 1V Left  Result Date: 08/14/2020 CLINICAL DATA:  Fall today. EXAM: LEFT SHOULDER COMPARISON:  None. FINDINGS: Single frontal view of the left shoulder obtained. No evidence of fracture or dislocation. The acromioclavicular joint is congruent. Glenohumeral joint is grossly normal on the single view. Soft tissues are unremarkable. IMPRESSION: Single frontal view of the left shoulder without evidence of acute fracture or dislocation. Electronically Signed   By: Keith Rake M.D.   On: 08/14/2020 00:36   CT HEAD WO CONTRAST  Result Date: 08/14/2020 CLINICAL DATA:  Fall.  Left spastic hemiparesis and global aphasia. EXAM: CT HEAD WITHOUT CONTRAST TECHNIQUE: Contiguous axial images were obtained from the base of the skull through the vertex without intravenous contrast. COMPARISON:  07/23/2020 FINDINGS: Brain: There is hypoattenuation within the superior left frontal lobe at the site of recent hemorrhage. No residual blood products identified. No midline shift or other mass effect. There is periventricular hypoattenuation compatible with chronic microvascular disease. Vascular: No hyperdense vessel or unexpected calcification. Skull: Normal. Negative for fracture or focal lesion. Sinuses/Orbits: No acute finding. Other: None. IMPRESSION: 1. Hypoattenuation within the superior left frontal lobe at the site of recent hemorrhage. No acute findings. 2. Chronic microvascular ischemia. Electronically Signed   By: Ulyses Jarred M.D.   On: 08/14/2020 00:36    Recent Labs    08/15/20 0620  WBC 3.5*  HGB 11.6*  HCT 36.3  PLT 148*   Recent Labs    08/13/20 0505 08/14/20 0645  NA 139 138  K 4.1 4.0  CL 105 104  CO2 26 26  GLUCOSE 79 88  BUN 27* 26*  CREATININE 1.21* 1.13*  CALCIUM 10.0 9.8    Physical Exam: BP 111/74 (BP Location: Left Arm)   Pulse 86   Temp 98 F (36.7 C)   Resp 14   Ht 5\' 6"  (1.676 m)   Wt 92.1 kg   SpO2 96%   BMI 32.77 kg/m  Constitutional: No distress . Vital signs reviewed. HENT: Normocephalic.  Atraumatic. Eyes: EOMI. No discharge. Cardiovascular: No JVD. Irregularly irregular.  Respiratory: Normal effort.  No stridor. Bilaterally clear to ausculation.  GI: Non-distended. BS+ Skin: Warm and dry.  Intact. Psych: Mood and behavior improving.  Musc: B/l LE edema Neuro: Alert and oriented x2 Motor: Somewhat limited due to participation, but appears to be >4/5 throughout, ?weaker in RLE, improving  Assessment/Plan: 1. 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  Physiatrist is providing close team supervision and 24 hour management of active medical problems listed below.  Physiatrist and rehab team continue to assess barriers to discharge/monitor patient progress toward functional and medical goals  Care Tool:  Bathing  Bathing activity did not occur: Safety/medical concerns (did not occur on eval, as pt was not able to be aroused awake) Body parts bathed by patient: Right arm, Left arm, Chest, Abdomen, Face, Front perineal area, Buttocks, Right  upper leg, Left upper leg, Left lower leg, Right lower leg   Body parts bathed by helper: Right lower leg Body parts n/a: Front perineal area, Buttocks, Right upper leg, Left upper leg, Right lower leg, Left lower leg   Bathing assist Assist  Level: Contact Guard/Touching assist     Upper Body Dressing/Undressing Upper body dressing Upper body dressing/undressing activity did not occur (including orthotics):  (did not occur on eval, as pt was not able to be aroused awake) What is the patient wearing?: Button up shirt    Upper body assist Assist Level: Set up assist    Lower Body Dressing/Undressing Lower body dressing      What is the patient wearing?: Incontinence brief, Pants     Lower body assist Assist for lower body dressing: Minimal Assistance - Patient > 75%     Toileting Toileting    Toileting assist Assist for toileting: Contact Guard/Touching assist     Transfers Chair/bed transfer  Transfers assist  Chair/bed transfer activity did not occur: Safety/medical concerns  Chair/bed transfer assist level: Contact Guard/Touching assist     Locomotion Ambulation   Ambulation assist   Ambulation activity did not occur: Safety/medical concerns  Assist level: Contact Guard/Touching assist Assistive device: Walker-rolling Max distance: 100 ft   Walk 10 feet activity   Assist  Walk 10 feet activity did not occur: Safety/medical concerns  Assist level: Contact Guard/Touching assist Assistive device: Walker-rolling   Walk 50 feet activity   Assist Walk 50 feet with 2 turns activity did not occur: Safety/medical concerns  Assist level: Contact Guard/Touching assist Assistive device: Walker-rolling    Walk 150 feet activity   Assist Walk 150 feet activity did not occur: Safety/medical concerns         Walk 10 feet on uneven surface  activity   Assist Walk 10 feet on uneven surfaces activity did not occur: Safety/medical concerns   Assist level: Minimal Assistance - Patient > 75% Assistive device: Aeronautical engineer Will patient use wheelchair at discharge?: Yes Type of Wheelchair: Manual Wheelchair activity did not occur: Refused (fatigued at end of  session)         Wheelchair 50 feet with 2 turns activity    Assist    Wheelchair 50 feet with 2 turns activity did not occur: Refused       Wheelchair 150 feet activity     Assist Wheelchair 150 feet activity did not occur: Refused          Medical Problem List and Plan: 1.    Left spastic hemiparesis, global aphasia secondary to small left frontal medial ICH and left temporal occipital ICH in the setting of chronic Coumadin/likely hypertensive  Continue CIR  WHO/PRAFO nightly  Repeat head CT with no acute changes on 8/17  Team conference today to discuss current and goals and coordination of care, home and environmental barriers, and discharge planning with nursing, case manager, and therapies. Please see conference note from today as well.  2.  Antithrombotics: -DVT/anticoagulation: Subcutaneous heparin for DVT prophylaxis initiated July 25, 2020             -antiplatelet therapy: N/A 3. Pain Management: Tylenol as needed  Controlled 8/18 4. Mood: D/c Ativan due to lethargy.              -antipsychotic agents: N/A 5. Neuropsych: This patient is not capable of making decisions on her own behalf. 6. Fluids/Electrolytes/Nutrition: Routine in and outs.  8.  Seizure prophylaxis.    Keppra 1000 mg twice daily, decreased to 500 twice daily on 8/10, decreased to 250 twice daily on 8/17  EEG negative 9.  Atrial fibrillation.  Chronic Coumadin reversed.  Cardiac rate controlled. Continue Lopressor 75 mg twice daily  Rate controlled on 8/18 10. Post stroke dysphagia.  Advanced to D3 thins  D/ced nasogastric tube feeds.    Dietary follow-up  Advance diet as tolerated 11.  Hypertension.  Lasix 20 mg daily, Avapro 150 mg daily, Toprol XL 100 mg daily currently on hold.    Continue hydralazine 25 mg twice daily, decreased to daily on 8/14, DC'd on 8/15  Continue Lopressor 75 BID  Resume as needed  Relatively controlled on 8/18 12.  Hypothyroidism.  Synthroid 13.   Hyperlipidemia.  Statin on hold in the setting of ICH 14.  UTI/E. coli.  Completed course of Rocephin 15.  GERD.  Continue Protonix  16.  Hyponatremia  Sodium 138 on 8/17 17.  Thrombocytopenia: Resolved  Platelets 158 on 8/6 18.  Leukopenia  Resolved 19.  Hyperkalemia  Potassium 4.0 on 8/17  Lokelma ordered x3 previously 20.  CKD III  Creatinine 1.13 on 8/17  Encourage fluids 21.?  Sleep disturbance-per daughter  Melatonin ordered  Improving 22.  Peripheral edema  Lasix 20 daily started on 8/12, increased to twice daily on 8/13, increased to 40 BID on 8/18  LOS: 20 days A FACE TO FACE EVALUATION WAS PERFORMED  Nalla Purdy [name] 08/15/2020, 10:15 AM

## 2020-08-15 NOTE — Discharge Summary (Signed)
Physical Therapy Discharge Summary  Patient Details  Name: Lori Bridges MRN: [id number] Date of Birth: 1930/01/09  Today's Date: 08/15/2020 PT Individual Time: 0915-1015 PT Individual Time Calculation (min): 60 min    Patient has met 10 of 10 long term goals due to improved activity tolerance, improved balance, improved postural control, increased strength, ability to compensate for deficits, functional use of  right upper extremity and right lower extremity, improved attention, improved awareness and improved coordination.  Patient to discharge at an ambulatory level [place].  Patient's daughter  is independent to provide the necessary physical and cognitive assistance at discharge.  Recommendation:  Patient will benefit from ongoing skilled PT services in home health setting to continue to advance safe functional mobility, address ongoing impairments in general weakness, balance and gait deficits, and decreased balance in order to minimize fall risk.  Equipment: RW and 20x18 w/c with extended brake levers (w/c for community mobility)  Reasons for discharge: treatment goals met  Patient/family agrees with progress made and goals achieved: Yes  PT Discharge Precautions/Restrictions Precautions Precautions: Fall Restrictions Weight Bearing Restrictions: No Pain Pain Assessment Pain Scale: 0-10 Pain Score: 0-No pain Vision/Perception  Perception Perception: Not tested Praxis Praxis: Not tested  Cognition Overall Cognitive Status: Within Functional Limits for tasks assessed Arousal/Alertness: Awake/alert Orientation Level: Oriented X4 Attention: Sustained Sustained Attention: Appears intact Memory: Impaired Memory Impairment: Decreased recall of new information Awareness: Appears intact Safety/Judgment: Impaired Sensation Sensation Light Touch: Not tested Hot/Cold: Not tested Proprioception: Not tested Stereognosis: Not tested Coordination Gross Motor Movements  are Fluid and Coordinated: No Fine Motor Movements are Fluid and Coordinated: No Coordination and Movement Description: impacted by general deconditioning and mild R HB weakness, slowed and delayed Motor  Motor Motor: Hemiplegia;Within Functional Limits  Mobility Bed Mobility Bed Mobility: Rolling Right;Rolling Left;Supine to Sit;Sit to Supine Rolling Right: Minimal Assistance - Patient > 75% Rolling Left: Minimal Assistance - Patient > 75% Supine to Sit: Supervision/Verbal cueing Sitting - Scoot to Edge of Bed: Supervision/Verbal cueing Sit to Supine: Minimal Assistance - Patient > 75% Transfers Transfers: Sit to Stand;Stand to Sit;Stand Pivot Transfers Sit to Stand: Minimal Assistance - Patient > 75% Stand to Sit: Minimal Assistance - Patient > 75% Stand Pivot Transfers: Minimal Assistance - Patient > 75% Stand Pivot Transfer Details: Tactile cues for initiation;Tactile cues for sequencing;Verbal cues for technique;Verbal cues for sequencing;Verbal cues for precautions/safety;Visual cues/gestures for sequencing Transfer (Assistive device): 1 person hand held assist Locomotion  Gait Ambulation: Yes Gait Assistance: Minimal Assistance - Patient > 75% Gait Distance (Feet): 175 Feet (ft) Assistive device: Rolling walker Gait Assistance Details: Verbal cues for technique;Verbal cues for gait pattern;Verbal cues for precautions/safety;Verbal cues for sequencing;Verbal cues for safe use of DME/AE;Other (comment) Gait Assistance Details: With fatigue, required assist for RW management to maintain forward and straight gait. Gait Gait: Yes Gait Pattern: Impaired Gait Pattern: Step-to pattern;Decreased step length - right;Decreased step length - left;Decreased stance time - right;Decreased stride length;Decreased hip/knee flexion - right;Decreased hip/knee flexion - left;Decreased weight shift to right;Poor foot clearance - left;Poor foot clearance - right Gait velocity: Grossly  decreased Stairs / Additional Locomotion Stairs: Yes Stairs Assistance: Minimal Assistance - Patient > 75% Stair Management Technique: Two rails Number of Stairs: 4 Height of Stairs: 6 (inches) Wheelchair Mobility Wheelchair Mobility: No  Trunk/Postural Assessment  Cervical Assessment Cervical Assessment: Within Functional Limits Thoracic Assessment Thoracic Assessment: Exceptions to [facility] (rounded shoudlers) Lumbar Assessment Lumbar Assessment: Exceptions to [facility] (sacral sitting, posterior pelvic tilt) Postural  Control Postural Control: Within Functional Limits  Balance Balance Balance Assessed: Yes Dynamic Sitting Balance Sitting balance - Comments: Can maintain sitting EOB or on mat table without BUE support with supervision Extremity Assessment  RUE Assessment RUE Assessment: Within Functional Limits LUE Assessment LUE Assessment: Within Functional Limits RLE Assessment RLE Assessment: Within Functional Limits General Strength Comments: Grossly 4/5 LLE Assessment LLE Assessment: Within Functional Limits General Strength Comments: Grossly 4+/5  Skilled intervention: Pt received supine in bed, daughter at bedside, pt agreeable to PT session. Pt performed rolling L<>R with HOB flat and no use of HR's, requiring minA for hip translation. Supine<>sit with supervision without HR's and HOB flat. SPT from EOB to w/c with CGA/minA and no AD, cues for sequencing and technique. Pt transported in w/c to therapy gym for time management where she navigated up/down x4 steps with minA and B HR support, step-to pattern, cues for L foot leading ascent and R foot leading descent. Pt reporting being too fatigued to attempt another set of 4 steps, thus deferred. Pt transported to hallway where she ambulated 146f with CGA/minA and RW, required assist for RW management with fatigue and cues for maintaining proximity to AD as well as increasing B step length/height. Pt transported to car simulator  where she performed car transfer with minA and no AD, cues for sequencing and correct technique. Pt was able to pick up cone from floor (while standing) with minA and no AD. Pt refused to attempt negotiating up/down ramp 2/2 fatigue, even when offered extended rest break. Pt transported back to her room where she performed SPT back to bed with minA, and sit>supine with minA for RLE management. Daughter at bedside, all questions/concerns addressed, family feels ready for DC. Pt ended session with bed alarm on, needs in reach.   Guilianna Mckoy P Tulsi Crossett PT 08/15/2020, 11:38 AM

## 2020-08-15 NOTE — Patient Care Conference (Signed)
Inpatient RehabilitationTeam Conference and Plan of Care Update Date: 08/15/2020   Time: 10:59 AM    Patient Name: Lori Bridges      Medical Record Number: [id number]  Date of Birth: [birth year] Sex: Female         Room/Bed: 4W06C/4W06C-01 Payor Info: Payor: MEDICARE / Plan: MEDICARE PART A AND B / Product Type: *No Product type* /    Admit Date/Time:  07/26/2020  4:25 PM  Primary Diagnosis:  ICH (intracerebral hemorrhage) [name])  Hospital Problems: Principal Problem:   ICH (intracerebral hemorrhage) (HCC) - 2 small L brain ICH, likely d/t HTN on subtherapeutic warfarin Active Problems:   Benign essential HTN   Dysphagia, post-stroke   PAF (paroxysmal atrial fibrillation) (HCC)   Seizure prophylaxis   Gastroesophageal reflux disease   Atrial fibrillation (HCC)   Thrombocytopenia (HCC)   Hypernatremia   Leukopenia   Hyperkalemia   Hyponatremia   Sleep disturbance   Peripheral edema   Fall    Expected Discharge Date: Expected Discharge Date: 08/16/20  Team Members Present: Physician leading conference: Dr. Delice Lesch Care Coodinator Present: Erlene Quan, BSW;Buna Cuppett Hervey Ard, RN, BSN, [place] Nurse Present: Glenis Smoker, RN PT Present: Other (comment) (Christain Manhard, PT) OT Present: Meriel Pica, OT SLP Present: Jettie Booze, CF-SLP PPS Coordinator present : Ileana Ladd, Burna Mortimer, SLP     Current Status/Progress Goal Weekly Team Focus  Bowel/Bladder   Pt is continent of b/b. LBM 08/13/20  pt will remain continent of b/b  q2h toileting.   Swallow/Nutrition/ Hydration   Dysphaiga 3, thin liquids, Supervision  Mod  tolerance current diet, regular trials, reducing supervision   ADL's   CGA functional transfers; supervision to min assist for BADLs  mod to max assist LB ADLs, supervision to min assist for UB ADLs, min to mod assist functional transfers  functional transfers, ADL training, problem solving, initiating   Mobility   minA bed mobility,  minA transfers, gait 146f minA RW, 4 steps minA  minA bed mobility, minA transfers, gait 528fminA  standing balance, gait, stairs, DC planning   Communication   Improved verbal initiation and output; sup/min  Mod  goals met, sometimes needs cues to elaborate responses   Safety/Cognition/ Behavioral Observations  Min-Mod  Mod A  basic problem solving, task initiation, attention, recall   Pain   No c/o pain at this time.  remain pain free  assess pain qshift/PRN   Skin   MASD to peri area. Sm abrasion to R shoulder  remain free of new skin breakdown/infection  Assess skin qshit/prn     Discharge Planning:  [facility]   Team Discussion: Patient has made great progress/ good improvement with function during rehab.  Patient on target to meet rehab goals: Yes; exceeded goals for SLP except for memory however is close to baseline level.  *See Care Plan and progress notes for long and short-term goals.   Revisions to Treatment Plan:    Teaching Needs: Family education with daughters  Current Barriers to Discharge: Home enviroment access/layout  Possible Resolutions to Barriers: Daughter has information on ramp installation and ramp rentals [facility] recommended     Medical Summary Current Status: Left spastic hemiparesis, global aphasia secondary to small left frontal medial ICH and left temporal occipital ICH in the setting of chronic Coumadin/likely hypertensive  Barriers to Discharge: Medical stability;Behavior;Home enviroment access/layout   Possible Resolutions to [facility]rapies, follow labs, cont edu,  making progress   Continued Need for Acute Rehabilitation Level of Care: The patient requires daily medical management by a physician with specialized training in physical medicine and rehabilitation for the following reasons: Direction of a multidisciplinary physical rehabilitation program to maximize functional  independence : Yes Medical management of patient stability for increased activity during participation in an intensive rehabilitation regime.: Yes Analysis of laboratory values and/or radiology reports with any subsequent need for medication adjustment and/or medical intervention. : Yes   I attest that I was present, lead the team conference, and concur with the assessment and plan of the team.   Dorien Chihuahua B 08/15/2020, 4:10 PM

## 2020-08-15 NOTE — Progress Notes (Signed)
Patient ID: Lori Bridges, female   DOB: 04/23/1930, 86 y.o.   MRN: [id number]  Team Conference Report to Patient/Family  Team Conference discussion was reviewed with the patient and caregiver, including goals, any changes in plan of care and target discharge date.  Patient and caregiver express understanding and are in agreement.  The patient has a target discharge date of 08/16/20.  [name] 08/15/2020, 2:53 PM

## 2020-08-16 NOTE — Discharge Instructions (Addendum)
Inpatient Rehab Discharge Instructions  Lori Bridges Discharge date and time: No discharge date for patient encounter.   Activities/Precautions/ Functional Status: Activity: activity as tolerated Diet: Mechanical soft Wound Care: none needed Functional status:  ___ No restrictions     ___ Walk up steps independently ___ 24/7 supervision/assistance   ___ Walk up steps with assistance ___ Intermittent supervision/assistance  ___ Bathe/dress independently ___ Walk with walker     _x__ Bathe/dress with assistance ___ Walk Independently    ___ Shower independently ___ Walk with assistance    ___ Shower with assistance ___ No alcohol     ___ Return to work/school ________ COMMUNITY REFERRALS UPON DISCHARGE:    Home Health:   PT     OT     ST                   Agency: [facility]  Phone: [phone]    Medical Equipment/Items Ordered: Wheelchair                                                 Agency/Supplier: [employer]   Special Instructions: No smoking or alcohol  Continue to hold Coumadin until further notice   My questions have been answered and I understand these instructions. I will adhere to these goals and the provided educational materials after my discharge from the hospital.  Patient/Caregiver Signature _______________________________ Date __________  Clinician Signature _______________________________________ Date __________  Please bring this form and your medication list with you to all your follow-up doctor's appointments.   =================================================  MEDICATIONS TO STOP TAKING  Irbesartan (not needed) Lorazepam (Replaced with melatonin)  Warfarin ( high risk for bleeding in the brain) Metoprolol succinate ( replaced with metoprolol tartrate)

## 2020-08-16 NOTE — Progress Notes (Signed)
[place] PHYSICAL MEDICINE & REHABILITATION PROGRESS NOTE  Subjective/Complaints: Patient seen ambulation with RW and daughter.   She slept well overnight per daughter and is eager for discharge.   ROS: Denies CP, SOB, N/V/D  Objective: Vital Signs: Blood pressure 118/72, pulse 82, temperature 98.5 F (36.9 C), temperature source Oral, resp. rate 17, height 5\' 6"  (1.676 m), weight 89.8 kg, SpO2 97 %. No results found. Recent Labs    08/15/20 0620  WBC 3.5*  HGB 11.6*  HCT 36.3  PLT 148*   Recent Labs    08/14/20 0645  NA 138  K 4.0  CL 104  CO2 26  GLUCOSE 88  BUN 26*  CREATININE 1.13*  CALCIUM 9.8    Physical Exam: BP 118/72 (BP Location: Right Arm)    Pulse 82    Temp 98.5 F (36.9 C) (Oral)    Resp 17    Ht 5\' 6"  (1.829 m)    Wt 89.8 kg Comment: retake   SpO2 97%    BMI 31.95 kg/m   Constitutional: No distress . Vital signs reviewed. HENT: Normocephalic.  Atraumatic. Eyes: EOMI. No discharge. Cardiovascular: No JVD. Respiratory: Normal effort.  No stridor. GI: Non-distended. Skin: Warm and dry.  Intact. Psych: Normal mood.  Normal behavior. Musc: B/l LE edema Neuro: Alert and oriented x2 Motor: Somewhat limited due to participation, but appears to be >4/5 throughout, ?weaker in RLE, improving  Assessment/Plan: 1. 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  Physiatrist is providing close team supervision and 24 hour management of active medical problems listed below.  Physiatrist and rehab team continue to assess barriers to discharge/monitor patient progress toward functional and medical goals  Care Tool:  Bathing  Bathing activity did not occur: Safety/medical concerns (did not occur on eval, as pt was not able to be aroused awake) Body parts  bathed by patient: Right arm, Left arm, Chest, Abdomen, Face, Front perineal area, Buttocks, Right upper leg, Left upper leg, Left lower leg, Right lower leg   Body parts bathed by helper: Right lower leg Body parts n/a: Front perineal area, Buttocks, Right upper leg, Left upper leg, Right lower leg, Left lower leg   Bathing assist Assist Level: Contact Guard/Touching assist     Upper Body Dressing/Undressing Upper body dressing Upper body dressing/undressing activity did not occur (including orthotics):  (did not occur on eval, as pt was not able to be aroused awake) What is the patient wearing?: Button up shirt    Upper body assist Assist Level: Set up assist    Lower Body Dressing/Undressing Lower body dressing      What is the patient wearing?: Incontinence brief, Pants     Lower body assist Assist for lower body dressing: Minimal Assistance - Patient > 75%     Toileting Toileting    Toileting assist Assist for toileting: Contact Guard/Touching assist     Transfers Chair/bed transfer  Transfers assist  Chair/bed transfer activity did not occur: Safety/medical concerns  Chair/bed transfer assist level: Minimal Assistance - Patient > 75%     Locomotion Ambulation   Ambulation assist   Ambulation activity did not occur: Safety/medical concerns  Assist level: Minimal Assistance - Patient > 75% Assistive device: Walker-rolling Max distance: 157ft   Walk 10 feet activity   Assist  Walk 10 feet activity  did not occur: Safety/medical concerns  Assist level: Minimal Assistance - Patient > 75% Assistive device: Walker-rolling   Walk 50 feet activity   Assist Walk 50 feet with 2 turns activity did not occur: Safety/medical concerns  Assist level: Minimal Assistance - Patient > 75% Assistive device: Walker-rolling    Walk 150 feet activity   Assist Walk 150 feet activity did not occur: Safety/medical concerns  Assist level: Minimal Assistance -  Patient > 75% Assistive device: Walker-rolling    Walk 10 feet on uneven surface  activity   Assist Walk 10 feet on uneven surfaces activity did not occur: Safety/medical concerns   Assist level: Minimal Assistance - Patient > 75% Assistive device: Aeronautical engineer Will patient use wheelchair at discharge?: No Type of Wheelchair: Manual Wheelchair activity did not occur: Refused (fatigued at end of session)         Wheelchair 50 feet with 2 turns activity    Assist    Wheelchair 50 feet with 2 turns activity did not occur: Refused       Wheelchair 150 feet activity     Assist Wheelchair 150 feet activity did not occur: Refused          Medical Problem List and Plan: 1.    Left spastic hemiparesis, global aphasia secondary to small left frontal medial ICH and left temporal occipital ICH in the setting of chronic Coumadin/likely hypertensive  DC today  Will see patient for transitional care management in 1-2 weeks post-discharge  WHO/PRAFO nightly  Repeat head CT with no acute changes on 8/17 2.  Antithrombotics: -DVT/anticoagulation: Subcutaneous heparin for DVT prophylaxis initiated July 25, 2020             -antiplatelet therapy: N/A 3. Pain Management: Tylenol as needed  Controlled 8/19 4. Mood: D/c Ativan due to lethargy.              -antipsychotic agents: N/A 5. Neuropsych: This patient is not capable of making decisions on her own behalf. 6. Fluids/Electrolytes/Nutrition: Routine in and outs. 8.  Seizure prophylaxis.    Keppra 1000 mg twice daily, decreased to 500 twice daily on 8/10, decreased to 250 twice daily on 8/17, plan to d/c as outpatient  EEG negative 9.  Atrial fibrillation.  Chronic Coumadin reversed.  Cardiac rate controlled. Continue Lopressor 75 mg twice daily  Rate controlled on 8/18 10. Post stroke dysphagia.  Advanced to D3 thins  D/ced nasogastric tube feeds.    Dietary follow-up  Advance diet as  tolerated 11.  Hypertension.  Lasix 20 mg daily, Avapro 150 mg daily, Toprol XL 100 mg daily currently on hold.    Continue hydralazine 25 mg twice daily, decreased to daily on 8/14, DC'd on 8/15  Continue Lopressor 75 BID  Resume as needed  Controlled on 8/19  12.  Hypothyroidism.  Synthroid 13.  Hyperlipidemia.  Statin on hold in the setting of ICH 14.  UTI/E. coli.  Completed course of Rocephin 15.  GERD.  Continue Protonix  16.  Hyponatremia  Sodium 138 on 8/17 17.  Thrombocytopenia: Resolved  Platelets 158 on 8/6 18.  Leukopenia  Resolved 19.  Hyperkalemia  Potassium 4.0 on 8/17  Lokelma ordered x3 previously 20.  CKD III  Creatinine 1.13 on 8/17  Encourage fluids 21.?  Sleep disturbance-per daughter  Melatonin ordered  Improving 22.  Peripheral edema  Lasix 20 daily started on 8/12, increased to twice daily on 8/13, increased to 40  BID on 8/18  > 30 minutes spent in total in discharge planning between myself and PA regarding aforementioned, as well discussion regarding DME equipment, follow-up appointments, follow-up therapies, discharge medications, discharge recommendations  LOS: 21 days A FACE TO FACE EVALUATION WAS PERFORMED  Lori Bridges 08/16/2020, 1:28 PM

## 2020-08-16 NOTE — Progress Notes (Signed)
Inpatient Rehabilitation Care Coordinator  Discharge Note  The overall goal for the admission was met for:   Discharge location: Yes, Home  Length of Stay: Yes  Discharge activity level: Yes  Home/community participation: Yes  Services provided included: MD, RD, PT, OT, SLP, RN, CM, TR, Pharmacy and [place]: Medicare  Follow-up services arranged: Home Health: [facility]   Comments (or additional information):  Patient/Family verbalized understanding of follow-up arrangements: Yes  Individual responsible for coordination of the follow-up planMarcia Brash [phone]  Confirmed correct DME delivered: [name] 08/16/2020    [name]

## 2020-08-16 NOTE — Progress Notes (Signed)
Pt discharged home, instructions given per Linna Hoff, [place], Daughter verb understanding, pt stable and no further questions per pt/family D/C per wc w all belongings sent w pt Orene Desanctis, RN 08/16/2020 [phone]

## 2020-08-18 ENCOUNTER — Other Ambulatory Visit: Payer: Self-pay

## 2020-08-18 ENCOUNTER — Encounter (HOSPITAL_COMMUNITY): Payer: Self-pay | Admitting: Emergency Medicine

## 2020-08-18 ENCOUNTER — Inpatient Hospital Stay (HOSPITAL_COMMUNITY)
Admission: EM | Admit: 2020-08-18 | Discharge: 2020-09-13 | DRG: 100 | Disposition: A | Discharge status: Skilled Nursing Facility | Payer: Medicare Other | Attending: Internal Medicine | Admitting: Internal Medicine

## 2020-08-18 DIAGNOSIS — Z6826 Body mass index (BMI) 26.0-26.9, adult: Secondary | ICD-10-CM

## 2020-08-18 DIAGNOSIS — R569 Unspecified convulsions: Secondary | ICD-10-CM

## 2020-08-18 DIAGNOSIS — I130 Hypertensive heart and chronic kidney disease with heart failure and stage 1 through stage 4 chronic kidney disease, or unspecified chronic kidney disease: Secondary | ICD-10-CM | POA: Diagnosis present

## 2020-08-18 DIAGNOSIS — Z515 Encounter for palliative care: Secondary | ICD-10-CM | POA: Diagnosis not present

## 2020-08-18 DIAGNOSIS — I619 Nontraumatic intracerebral hemorrhage, unspecified: Secondary | ICD-10-CM | POA: Diagnosis present

## 2020-08-18 DIAGNOSIS — R0602 Shortness of breath: Secondary | ICD-10-CM

## 2020-08-18 DIAGNOSIS — J189 Pneumonia, unspecified organism: Secondary | ICD-10-CM

## 2020-08-18 DIAGNOSIS — G92 Toxic encephalopathy: Secondary | ICD-10-CM | POA: Diagnosis present

## 2020-08-18 DIAGNOSIS — Z881 Allergy status to other antibiotic agents status: Secondary | ICD-10-CM

## 2020-08-18 DIAGNOSIS — L89321 Pressure ulcer of left buttock, stage 1: Secondary | ICD-10-CM | POA: Diagnosis present

## 2020-08-18 DIAGNOSIS — F419 Anxiety disorder, unspecified: Secondary | ICD-10-CM | POA: Diagnosis present

## 2020-08-18 DIAGNOSIS — Z79899 Other long term (current) drug therapy: Secondary | ICD-10-CM

## 2020-08-18 DIAGNOSIS — N179 Acute kidney failure, unspecified: Secondary | ICD-10-CM | POA: Diagnosis present

## 2020-08-18 DIAGNOSIS — G40201 Localization-related (focal) (partial) symptomatic epilepsy and epileptic syndromes with complex partial seizures, not intractable, with status epilepticus: Principal | ICD-10-CM | POA: Diagnosis present

## 2020-08-18 DIAGNOSIS — R4182 Altered mental status, unspecified: Secondary | ICD-10-CM

## 2020-08-18 DIAGNOSIS — M7989 Other specified soft tissue disorders: Secondary | ICD-10-CM | POA: Diagnosis present

## 2020-08-18 DIAGNOSIS — I5033 Acute on chronic diastolic (congestive) heart failure: Secondary | ICD-10-CM | POA: Diagnosis present

## 2020-08-18 DIAGNOSIS — E46 Unspecified protein-calorie malnutrition: Secondary | ICD-10-CM | POA: Diagnosis present

## 2020-08-18 DIAGNOSIS — Z66 Do not resuscitate: Secondary | ICD-10-CM | POA: Diagnosis present

## 2020-08-18 DIAGNOSIS — R54 Age-related physical debility: Secondary | ICD-10-CM | POA: Diagnosis present

## 2020-08-18 DIAGNOSIS — T426X5A Adverse effect of other antiepileptic and sedative-hypnotic drugs, initial encounter: Secondary | ICD-10-CM | POA: Diagnosis present

## 2020-08-18 DIAGNOSIS — G40909 Epilepsy, unspecified, not intractable, without status epilepticus: Secondary | ICD-10-CM

## 2020-08-18 DIAGNOSIS — E039 Hypothyroidism, unspecified: Secondary | ICD-10-CM | POA: Diagnosis present

## 2020-08-18 DIAGNOSIS — Z8249 Family history of ischemic heart disease and other diseases of the circulatory system: Secondary | ICD-10-CM

## 2020-08-18 DIAGNOSIS — Z888 Allergy status to other drugs, medicaments and biological substances status: Secondary | ICD-10-CM

## 2020-08-18 DIAGNOSIS — Z882 Allergy status to sulfonamides status: Secondary | ICD-10-CM

## 2020-08-18 DIAGNOSIS — E778 Other disorders of glycoprotein metabolism: Secondary | ICD-10-CM | POA: Diagnosis present

## 2020-08-18 DIAGNOSIS — I509 Heart failure, unspecified: Secondary | ICD-10-CM

## 2020-08-18 DIAGNOSIS — R17 Unspecified jaundice: Secondary | ICD-10-CM | POA: Diagnosis present

## 2020-08-18 DIAGNOSIS — D696 Thrombocytopenia, unspecified: Secondary | ICD-10-CM | POA: Diagnosis not present

## 2020-08-18 DIAGNOSIS — Z79890 Hormone replacement therapy (postmenopausal): Secondary | ICD-10-CM

## 2020-08-18 DIAGNOSIS — I5032 Chronic diastolic (congestive) heart failure: Secondary | ICD-10-CM | POA: Diagnosis present

## 2020-08-18 DIAGNOSIS — I4821 Permanent atrial fibrillation: Secondary | ICD-10-CM | POA: Diagnosis present

## 2020-08-18 DIAGNOSIS — K219 Gastro-esophageal reflux disease without esophagitis: Secondary | ICD-10-CM | POA: Diagnosis present

## 2020-08-18 DIAGNOSIS — Z20822 Contact with and (suspected) exposure to covid-19: Secondary | ICD-10-CM | POA: Diagnosis present

## 2020-08-18 DIAGNOSIS — E785 Hyperlipidemia, unspecified: Secondary | ICD-10-CM | POA: Diagnosis present

## 2020-08-18 DIAGNOSIS — J9690 Respiratory failure, unspecified, unspecified whether with hypoxia or hypercapnia: Secondary | ICD-10-CM

## 2020-08-18 DIAGNOSIS — I639 Cerebral infarction, unspecified: Secondary | ICD-10-CM | POA: Diagnosis present

## 2020-08-18 DIAGNOSIS — Z9851 Tubal ligation status: Secondary | ICD-10-CM

## 2020-08-18 DIAGNOSIS — I5082 Biventricular heart failure: Secondary | ICD-10-CM | POA: Diagnosis present

## 2020-08-18 DIAGNOSIS — M810 Age-related osteoporosis without current pathological fracture: Secondary | ICD-10-CM | POA: Diagnosis present

## 2020-08-18 DIAGNOSIS — R069 Unspecified abnormalities of breathing: Secondary | ICD-10-CM

## 2020-08-18 DIAGNOSIS — Z86010 Personal history of colonic polyps: Secondary | ICD-10-CM

## 2020-08-18 DIAGNOSIS — R627 Adult failure to thrive: Secondary | ICD-10-CM

## 2020-08-18 DIAGNOSIS — I480 Paroxysmal atrial fibrillation: Secondary | ICD-10-CM | POA: Diagnosis present

## 2020-08-18 DIAGNOSIS — R739 Hyperglycemia, unspecified: Secondary | ICD-10-CM | POA: Diagnosis not present

## 2020-08-18 DIAGNOSIS — E669 Obesity, unspecified: Secondary | ICD-10-CM | POA: Diagnosis present

## 2020-08-18 DIAGNOSIS — E162 Hypoglycemia, unspecified: Secondary | ICD-10-CM | POA: Diagnosis not present

## 2020-08-18 DIAGNOSIS — N183 Chronic kidney disease, stage 3 unspecified: Secondary | ICD-10-CM | POA: Diagnosis present

## 2020-08-18 DIAGNOSIS — L8990 Pressure ulcer of unspecified site, unspecified stage: Secondary | ICD-10-CM | POA: Insufficient documentation

## 2020-08-18 DIAGNOSIS — R339 Retention of urine, unspecified: Secondary | ICD-10-CM | POA: Diagnosis not present

## 2020-08-18 DIAGNOSIS — N1831 Chronic kidney disease, stage 3a: Secondary | ICD-10-CM | POA: Diagnosis present

## 2020-08-18 DIAGNOSIS — R638 Other symptoms and signs concerning food and fluid intake: Secondary | ICD-10-CM

## 2020-08-18 DIAGNOSIS — L89311 Pressure ulcer of right buttock, stage 1: Secondary | ICD-10-CM | POA: Diagnosis present

## 2020-08-18 LAB — COMPREHENSIVE METABOLIC PANEL
ALT: 30 U/L (ref 0–44)
AST: 33 U/L (ref 15–41)
Albumin: 3.2 g/dL — ABNORMAL LOW (ref 3.5–5.0)
Alkaline Phosphatase: 191 U/L — ABNORMAL HIGH (ref 38–126)
Anion gap: 12 (ref 5–15)
BUN: 19 mg/dL (ref 8–23)
CO2: 24 mmol/L (ref 22–32)
Calcium: 10 mg/dL (ref 8.9–10.3)
Chloride: 105 mmol/L (ref 98–111)
Creatinine, Ser: 1.07 mg/dL — ABNORMAL HIGH (ref 0.44–1.00)
GFR calc Af Amer: 54 mL/min — ABNORMAL LOW (ref >60)
GFR calc non Af Amer: 46 mL/min — ABNORMAL LOW (ref >60)
Glucose, Bld: 103 mg/dL — ABNORMAL HIGH (ref 70–99)
Potassium: 4.1 mmol/L (ref 3.5–5.1)
Sodium: 141 mmol/L (ref 135–145)
Total Bilirubin: 1.5 mg/dL — ABNORMAL HIGH (ref 0.3–1.2)
Total Protein: 6.5 g/dL (ref 6.5–8.1)

## 2020-08-18 LAB — CBC
HCT: 41.3 % (ref 36.0–46.0)
Hemoglobin: 12.7 g/dL (ref 12.0–15.0)
MCH: 30 pg (ref 26.0–34.0)
MCHC: 30.8 g/dL (ref 30.0–36.0)
MCV: 97.4 fL (ref 80.0–100.0)
Platelets: 189 10*3/uL (ref 150–400)
RBC: 4.24 MIL/uL (ref 3.87–5.11)
RDW: 18.3 % — ABNORMAL HIGH (ref 11.5–15.5)
WBC: 4.1 10*3/uL (ref 4.0–10.5)
nRBC: 0 % (ref 0.0–0.2)

## 2020-08-18 LAB — LIPASE, BLOOD: Lipase: 91 U/L — ABNORMAL HIGH (ref 11–51)

## 2020-08-18 NOTE — ED Triage Notes (Signed)
Pt to triage via [facility] from home.  History of Stroke August 1st with R sided deficits.  Pt taking Keppra.  EMS called out for "seizure like activity".  Pt with involuntary R sided muscle movements.  Alert and oriented.  CBG 128.   Also reports R sided abd pain.

## 2020-08-19 ENCOUNTER — Emergency Department (HOSPITAL_COMMUNITY): Payer: Medicare Other

## 2020-08-19 ENCOUNTER — Inpatient Hospital Stay (HOSPITAL_COMMUNITY): Payer: Medicare Other

## 2020-08-19 DIAGNOSIS — G40901 Epilepsy, unspecified, not intractable, with status epilepticus: Secondary | ICD-10-CM | POA: Diagnosis not present

## 2020-08-19 DIAGNOSIS — R339 Retention of urine, unspecified: Secondary | ICD-10-CM | POA: Diagnosis not present

## 2020-08-19 DIAGNOSIS — E778 Other disorders of glycoprotein metabolism: Secondary | ICD-10-CM | POA: Diagnosis present

## 2020-08-19 DIAGNOSIS — R638 Other symptoms and signs concerning food and fluid intake: Secondary | ICD-10-CM | POA: Diagnosis not present

## 2020-08-19 DIAGNOSIS — K219 Gastro-esophageal reflux disease without esophagitis: Secondary | ICD-10-CM | POA: Diagnosis present

## 2020-08-19 DIAGNOSIS — N179 Acute kidney failure, unspecified: Secondary | ICD-10-CM | POA: Diagnosis present

## 2020-08-19 DIAGNOSIS — I4821 Permanent atrial fibrillation: Secondary | ICD-10-CM | POA: Diagnosis present

## 2020-08-19 DIAGNOSIS — G40201 Localization-related (focal) (partial) symptomatic epilepsy and epileptic syndromes with complex partial seizures, not intractable, with status epilepticus: Secondary | ICD-10-CM | POA: Diagnosis present

## 2020-08-19 DIAGNOSIS — G92 Toxic encephalopathy: Secondary | ICD-10-CM | POA: Diagnosis present

## 2020-08-19 DIAGNOSIS — Z66 Do not resuscitate: Secondary | ICD-10-CM | POA: Diagnosis present

## 2020-08-19 DIAGNOSIS — R17 Unspecified jaundice: Secondary | ICD-10-CM | POA: Diagnosis present

## 2020-08-19 DIAGNOSIS — G9340 Encephalopathy, unspecified: Secondary | ICD-10-CM | POA: Diagnosis not present

## 2020-08-19 DIAGNOSIS — R627 Adult failure to thrive: Secondary | ICD-10-CM | POA: Diagnosis not present

## 2020-08-19 DIAGNOSIS — R4182 Altered mental status, unspecified: Secondary | ICD-10-CM | POA: Diagnosis not present

## 2020-08-19 DIAGNOSIS — D696 Thrombocytopenia, unspecified: Secondary | ICD-10-CM | POA: Diagnosis not present

## 2020-08-19 DIAGNOSIS — I5033 Acute on chronic diastolic (congestive) heart failure: Secondary | ICD-10-CM | POA: Diagnosis present

## 2020-08-19 DIAGNOSIS — I639 Cerebral infarction, unspecified: Secondary | ICD-10-CM | POA: Diagnosis present

## 2020-08-19 DIAGNOSIS — G40909 Epilepsy, unspecified, not intractable, without status epilepticus: Secondary | ICD-10-CM | POA: Diagnosis not present

## 2020-08-19 DIAGNOSIS — Z7189 Other specified counseling: Secondary | ICD-10-CM | POA: Diagnosis not present

## 2020-08-19 DIAGNOSIS — E162 Hypoglycemia, unspecified: Secondary | ICD-10-CM | POA: Diagnosis not present

## 2020-08-19 DIAGNOSIS — M7989 Other specified soft tissue disorders: Secondary | ICD-10-CM | POA: Diagnosis present

## 2020-08-19 DIAGNOSIS — R569 Unspecified convulsions: Secondary | ICD-10-CM | POA: Diagnosis present

## 2020-08-19 DIAGNOSIS — E46 Unspecified protein-calorie malnutrition: Secondary | ICD-10-CM | POA: Diagnosis present

## 2020-08-19 DIAGNOSIS — I130 Hypertensive heart and chronic kidney disease with heart failure and stage 1 through stage 4 chronic kidney disease, or unspecified chronic kidney disease: Secondary | ICD-10-CM | POA: Diagnosis present

## 2020-08-19 DIAGNOSIS — Z515 Encounter for palliative care: Secondary | ICD-10-CM | POA: Diagnosis not present

## 2020-08-19 DIAGNOSIS — F419 Anxiety disorder, unspecified: Secondary | ICD-10-CM | POA: Diagnosis present

## 2020-08-19 DIAGNOSIS — N1831 Chronic kidney disease, stage 3a: Secondary | ICD-10-CM

## 2020-08-19 DIAGNOSIS — I5032 Chronic diastolic (congestive) heart failure: Secondary | ICD-10-CM | POA: Diagnosis not present

## 2020-08-19 DIAGNOSIS — E785 Hyperlipidemia, unspecified: Secondary | ICD-10-CM | POA: Diagnosis present

## 2020-08-19 DIAGNOSIS — R739 Hyperglycemia, unspecified: Secondary | ICD-10-CM | POA: Diagnosis not present

## 2020-08-19 DIAGNOSIS — L989 Disorder of the skin and subcutaneous tissue, unspecified: Secondary | ICD-10-CM | POA: Diagnosis not present

## 2020-08-19 DIAGNOSIS — I480 Paroxysmal atrial fibrillation: Secondary | ICD-10-CM | POA: Diagnosis not present

## 2020-08-19 DIAGNOSIS — I4891 Unspecified atrial fibrillation: Secondary | ICD-10-CM | POA: Diagnosis not present

## 2020-08-19 DIAGNOSIS — Z20822 Contact with and (suspected) exposure to covid-19: Secondary | ICD-10-CM | POA: Diagnosis present

## 2020-08-19 DIAGNOSIS — E039 Hypothyroidism, unspecified: Secondary | ICD-10-CM | POA: Diagnosis present

## 2020-08-19 DIAGNOSIS — E8770 Fluid overload, unspecified: Secondary | ICD-10-CM | POA: Diagnosis not present

## 2020-08-19 DIAGNOSIS — R54 Age-related physical debility: Secondary | ICD-10-CM | POA: Diagnosis present

## 2020-08-19 DIAGNOSIS — I619 Nontraumatic intracerebral hemorrhage, unspecified: Secondary | ICD-10-CM | POA: Diagnosis present

## 2020-08-19 DIAGNOSIS — I610 Nontraumatic intracerebral hemorrhage in hemisphere, subcortical: Secondary | ICD-10-CM | POA: Diagnosis not present

## 2020-08-19 DIAGNOSIS — M810 Age-related osteoporosis without current pathological fracture: Secondary | ICD-10-CM | POA: Diagnosis present

## 2020-08-19 LAB — PROTIME-INR
INR: 1.2 (ref 0.8–1.2)
Prothrombin Time: 14.5 seconds (ref 11.4–15.2)

## 2020-08-19 LAB — CBC WITH DIFFERENTIAL/PLATELET
Abs Immature Granulocytes: 0.02 10*3/uL (ref 0.00–0.07)
Basophils Absolute: 0 10*3/uL (ref 0.0–0.1)
Basophils Relative: 0 %
Eosinophils Absolute: 0.1 10*3/uL (ref 0.0–0.5)
Eosinophils Relative: 1 %
HCT: 44.5 % (ref 36.0–46.0)
Hemoglobin: 13.6 g/dL (ref 12.0–15.0)
Immature Granulocytes: 0 %
Lymphocytes Relative: 19 %
Lymphs Abs: 0.9 10*3/uL (ref 0.7–4.0)
MCH: 30.4 pg (ref 26.0–34.0)
MCHC: 30.6 g/dL (ref 30.0–36.0)
MCV: 99.6 fL (ref 80.0–100.0)
Monocytes Absolute: 0.8 10*3/uL (ref 0.1–1.0)
Monocytes Relative: 17 %
Neutro Abs: 3 10*3/uL (ref 1.7–7.7)
Neutrophils Relative %: 63 %
Platelets: 157 10*3/uL (ref 150–400)
RBC: 4.47 MIL/uL (ref 3.87–5.11)
RDW: 18.2 % — ABNORMAL HIGH (ref 11.5–15.5)
WBC: 4.8 10*3/uL (ref 4.0–10.5)
nRBC: 0 % (ref 0.0–0.2)

## 2020-08-19 LAB — PHOSPHORUS: Phosphorus: 3.5 mg/dL (ref 2.5–4.6)

## 2020-08-19 LAB — COMPREHENSIVE METABOLIC PANEL
ALT: 27 U/L (ref 0–44)
AST: 30 U/L (ref 15–41)
Albumin: 3.2 g/dL — ABNORMAL LOW (ref 3.5–5.0)
Alkaline Phosphatase: 175 U/L — ABNORMAL HIGH (ref 38–126)
Anion gap: 12 (ref 5–15)
BUN: 17 mg/dL (ref 8–23)
CO2: 23 mmol/L (ref 22–32)
Calcium: 10.2 mg/dL (ref 8.9–10.3)
Chloride: 106 mmol/L (ref 98–111)
Creatinine, Ser: 1.1 mg/dL — ABNORMAL HIGH (ref 0.44–1.00)
GFR calc Af Amer: 52 mL/min — ABNORMAL LOW (ref >60)
GFR calc non Af Amer: 45 mL/min — ABNORMAL LOW (ref >60)
Glucose, Bld: 76 mg/dL (ref 70–99)
Potassium: 4 mmol/L (ref 3.5–5.1)
Sodium: 141 mmol/L (ref 135–145)
Total Bilirubin: 2 mg/dL — ABNORMAL HIGH (ref 0.3–1.2)
Total Protein: 6.3 g/dL — ABNORMAL LOW (ref 6.5–8.1)

## 2020-08-19 LAB — AMMONIA: Ammonia: 25 umol/L (ref 9–35)

## 2020-08-19 LAB — TSH: TSH: 4.884 u[IU]/mL — ABNORMAL HIGH (ref 0.350–4.500)

## 2020-08-19 LAB — MAGNESIUM: Magnesium: 2.2 mg/dL (ref 1.7–2.4)

## 2020-08-19 LAB — SARS CORONAVIRUS 2 BY RT PCR (HOSPITAL ORDER, PERFORMED IN ~~LOC~~ HOSPITAL LAB): SARS Coronavirus 2: NEGATIVE

## 2020-08-19 LAB — APTT: aPTT: 30 seconds (ref 24–36)

## 2020-08-19 MED ORDER — HYDRALAZINE HCL 20 MG/ML IJ SOLN: 10.0000 mg | Freq: Four times a day (QID) | INTRAMUSCULAR | Status: DC | PRN

## 2020-08-19 MED ORDER — ONDANSETRON HCL 4 MG/2ML IJ SOLN: 4.0000 mg | Freq: Four times a day (QID) | INTRAMUSCULAR | Status: DC | PRN

## 2020-08-19 MED ORDER — LEVETIRACETAM IN NACL 500 MG/100ML IV SOLN
500.0000 mg | Freq: Two times a day (BID) | INTRAVENOUS | Status: DC
  Administered 2020-08-19 – 2020-08-20 (×3): 500 mg via INTRAVENOUS
  Filled 2020-08-19 (×4): qty 100

## 2020-08-19 MED ORDER — FUROSEMIDE 10 MG/ML IJ SOLN
40.0000 mg | Freq: Once | INTRAMUSCULAR | Status: AC
  Administered 2020-08-19: 40 mg via INTRAVENOUS
  Filled 2020-08-19: qty 4

## 2020-08-19 MED ORDER — ACETAMINOPHEN 650 MG RE SUPP
650.0000 mg | Freq: Four times a day (QID) | RECTAL | Status: DC | PRN
  Administered 2020-09-03: 650 mg via RECTAL
  Filled 2020-08-19 (×2): qty 1

## 2020-08-19 MED ORDER — LABETALOL HCL 5 MG/ML IV SOLN
10.0000 mg | Freq: Four times a day (QID) | INTRAVENOUS | Status: DC | PRN
  Administered 2020-08-24: 10 mg via INTRAVENOUS
  Filled 2020-08-19: qty 4

## 2020-08-19 MED ORDER — LEVETIRACETAM IN NACL 1000 MG/100ML IV SOLN
1000.0000 mg | Freq: Once | INTRAVENOUS | Status: AC
  Administered 2020-08-19: 1000 mg via INTRAVENOUS
  Filled 2020-08-19: qty 100

## 2020-08-19 MED ORDER — LEVETIRACETAM 500 MG PO TABS: 500.0000 mg | ORAL_TABLET | Freq: Two times a day (BID) | ORAL | Status: DC

## 2020-08-19 MED ORDER — LORAZEPAM 2 MG/ML IJ SOLN
1.0000 mg | Freq: Once | INTRAMUSCULAR | Status: AC
  Administered 2020-08-19: 1 mg via INTRAVENOUS
  Filled 2020-08-19: qty 1

## 2020-08-19 MED ORDER — ONDANSETRON HCL 4 MG PO TABS: 4.0000 mg | ORAL_TABLET | Freq: Four times a day (QID) | ORAL | Status: DC | PRN

## 2020-08-19 MED ORDER — ACETAMINOPHEN 325 MG PO TABS
650.0000 mg | ORAL_TABLET | Freq: Four times a day (QID) | ORAL | Status: DC | PRN
  Administered 2020-09-04 – 2020-09-09 (×2): 650 mg via ORAL
  Filled 2020-08-19: qty 2

## 2020-08-19 NOTE — ED Notes (Signed)
Noted pt had not provided urine sample, bladder scan 80cc.  During neuro exam, noted increased edema in arms and legs from previous RN assessment. H&P note mentions to monitor for fluid overload since PO lasix held for NPO. Paged night MD, orders obtained for 40mg  IV lasix.

## 2020-08-19 NOTE — ED Notes (Signed)
Patient transported to CT 

## 2020-08-19 NOTE — Consult Note (Signed)
NEUROLOGY CONSULT  Reason for Consult: seizure Referring Physician: Dr.   CC: none per pt  HPI: Lori Bridges is an 86 y.o. female  right-handed female history of atrial fibrillation maintained who was previously on Coumadin (now off d/t recent [place]), hypertension, hyperlipidemia, chronic kidney disease, mild memory loss who is cared for at home by family. The pt first presented July 23, 2020 with increasing confusion as well as slurred speech and subtle hand and forearm twitching. CT/MRI showed acute intraparenchymal hemorrhage in the left vertex 2.2 x 1.7 x 2.4 cm in size with surrounding vasogenic edema. Additional intraparenchymal hemorrhage in the left temporal lobe 11 x 6 x 10 mm. CT angiogram of head no evidence of enhancing mass or lesion. She went to inpt rehab post stroke, where she did well and had just returned home on 8/18. She was d/c on 250mg  Keppra po bid. Family says she has not missed a dose. On 8/21 she developed the same right hand/arm twitching that she had with previous seizures. Family called EMS and she was brought to triage where she waited for a very extended period before being seen. Family states she has had seizures now for nearly 20hrs on/off while sitting in waiting room. Finally, she was seen in ER on morning of 8/22. She was loaded with 1000mg  keppra IV and seizures have now stopped. Pt is encephalopathic thus info is given by pts two daughters. They deny any other s/s of illness such as UTI or URI. She only had some c/o dizziness since being home, no other concerns.   Past Medical History Past Medical History:  Diagnosis Date  . Adenomatous colon polyp 2005  . Anxiety age 10  . Carotid artery bruit 2001   left  . CKD (chronic kidney disease), stage III   . DDD (degenerative disc disease)   . DJD (degenerative joint disease)   . Gastroenteritis 2011  . GERD (gastroesophageal reflux disease) 2003  . History of pneumonia   . HTN (hypertension) 1983  .  Hyperlipidemia 2000  . Hypothyroidism 2009  . Iron deficiency anemia 1983  . Osteoarthritis 2001  . Osteoporosis 2001  . Permanent atrial fibrillation ([place]) 07/23/2010       . Persistent headaches 1987  . Pulmonary HTN ([place]) 04/03/2017   Moderate with PASP 80mmHg by echo 02/2017  . Tricuspid regurgitation    severe by echo 02/25/12  . Vertigo   . Vitamin D deficiency 2011    Past Surgical History Past Surgical History:  Procedure Laterality Date  . COLONOSCOPY W/ BIOPSIES AND POLYPECTOMY  09/16/2004   adenomatous polyp, diverticulosis, internal hemorrhoids  . s/p BLT    . TUBAL LIGATION      Family History Family History  Problem Relation Age of Onset  . Hypertension Father   . Peripheral vascular disease Father   . Diabetes Mother   . Heart disease Brother 52  . Liver cancer Sister     Social History    reports that she has never smoked. She has never used smokeless tobacco. She reports that she does not drink alcohol and does not use drugs.  Allergies Allergies  Allergen Reactions  . Doxycycline Other (See Comments)    UNKNOWN unknown  . Ezetimibe Other (See Comments)    Arthralgia, Myalgias  . Meclizine Other (See Comments) and Palpitations    Tachycardia  . Pravastatin Other (See Comments)    Arthralgia, Myalgias  . Colestipol Other (See Comments)  . Sulfamethoxazole Other (See Comments)  unknown  . Ace Inhibitors Cough  . Codeine Nausea Only  . Colestipol Hcl Other (See Comments)    Malaise  . Lovastatin Other (See Comments)    UNKNOWN Unknown  . Sulfa Antibiotics Other (See Comments)    unknown    Home Medications (Not in a hospital admission)   Hospital Medications . levETIRAcetam  500 mg Oral BID     ROS: History obtained from family d/t pt being encephalopathic  General ROS: negative for - chills, fatigue, fever, night sweats, weight gain or weight loss Psychological ROS: negative for - behavioral disorder, hallucinations, memory  difficulties, mood swings or suicidal ideation Ophthalmic ROS: negative for - blurry vision, double vision, eye pain or loss of vision ENT ROS: negative for - epistaxis, nasal discharge, oral lesions, sore throat, tinnitus or vertigo Allergy and Immunology ROS: negative for - hives or itchy/watery eyes Hematological and Lymphatic ROS: negative for - bleeding problems, bruising or swollen lymph nodes Endocrine ROS: negative for - galactorrhea, hair pattern changes, polydipsia/polyuria or temperature intolerance Respiratory ROS: negative for - cough, hemoptysis, shortness of breath or wheezing Cardiovascular ROS: negative for - chest pain, dyspnea on exertion, edema or irregular heartbeat Gastrointestinal ROS: negative for - abdominal pain, diarrhea, hematemesis, nausea/vomiting or stool incontinence Genito-Urinary ROS: negative for - dysuria, hematuria, incontinence or urinary frequency/urgency Musculoskeletal ROS: negative for - joint swelling or muscular weakness Neurological ROS: as noted in HPI Dermatological ROS: negative for rash and skin lesion changes   Physical Examination:  Vitals:   08/19/20 0730 08/19/20 1230 08/19/20 1310 08/19/20 1313  BP: (!) 138/98 (!) 163/117 (!) 141/92   Pulse: (!) 111 (!) 125 (!) 125   Resp: 14 16 16    Temp: 97.6 F (36.4 C) 98.1 F (36.7 C)    TempSrc: Oral Oral    SpO2: 99% 96% 97%   Weight:    81.6 kg  Height:    5\' 7"  (1.702 m)    General - frail, elderly Heart - Regular rate and rhythm - no murmer Lungs - Clear to auscultation Abdomen - Soft - non tender Extremities - Distal pulses intact - 2+ edema in LEs and right arm Skin - Warm and dry  Neurologic Examination:  Mental Status:  Lethargic, but alerts briefly to voice. Poor attention. Able to [facility] name only.  Speech is sparse. Able to follow simple commands only Cranial Nerves:  Right facial droop. PERRL, briefly tracks, but poorly interactiove Motor: Right arm did not have  movement, trace movement seen in right leg. Diffusely weak, moving left more redily. No further tremor or seizure seen on my exam after keppra infused.  Sensory: Intact to light touch in all extremities. Plantars: mute Cerebellar: unable Gait: not tested   LABORATORY STUDIES:  Basic Metabolic Panel: Recent Labs  Lab 08/13/20 0505 08/14/20 0645 08/18/20 1746  NA 139 138 141  K 4.1 4.0 4.1  CL 105 104 105  CO2 26 26 24   GLUCOSE 79 88 103*  BUN 27* 26* 19  CREATININE 1.21* 1.13* 1.07*  CALCIUM 10.0 9.8 10.0    Liver Function Tests: Recent Labs  Lab 08/18/20 1746  AST 33  ALT 30  ALKPHOS 191*  BILITOT 1.5*  PROT 6.5  ALBUMIN 3.2*   Recent Labs  Lab 08/18/20 1746  LIPASE 91*   No results for input(s): AMMONIA in the last 168 hours.  CBC: Recent Labs  Lab 08/15/20 0620 08/18/20 1746  WBC 3.5* 4.1  NEUTROABS 1.9  --  HGB 11.6* 12.7  HCT 36.3 41.3  MCV 96.3 97.4  PLT 148* 189    Cardiac Enzymes: No results for input(s): CKTOTAL, CKMB, CKMBINDEX, TROPONINI in the last 168 hours.  BNP: Invalid input(s): POCBNP  CBG: No results for input(s): GLUCAP in the last 168 hours.  Microbiology:   Coagulation Studies: No results for input(s): LABPROT, INR in the last 72 hours.  Urinalysis: No results for input(s): COLORURINE, LABSPEC, PHURINE, GLUCOSEU, HGBUR, BILIRUBINUR, KETONESUR, PROTEINUR, UROBILINOGEN, NITRITE, LEUKOCYTESUR in the last 168 hours.  Invalid input(s): APPERANCEUR  Lipid Panel:     Component Value Date/Time   CHOL 205 (H) 07/23/2020 0200   TRIG 52 07/23/2020 0200   HDL 55 07/23/2020 0200   CHOLHDL 3.7 07/23/2020 0200   VLDL 10 07/23/2020 0200   LDLCALC 140 (H) 07/23/2020 0200    HgbA1C:  Lab Results  Component Value Date   HGBA1C 5.7 (H) 07/23/2020    Urine Drug Screen:  No results found for: LABOPIA, COCAINSCRNUR, LABBENZ, AMPHETMU, THCU, LABBARB   Alcohol Level:  No results for input(s): ETH in the last 168  hours.  Miscellaneous labs:  EKG  EKG  IMAGING: CT Head Wo Contrast  Result Date: 08/19/2020 CLINICAL DATA:  86 year old female with seizure. History of prior intracranial hemorrhage. EXAM: CT HEAD WITHOUT CONTRAST TECHNIQUE: Contiguous axial images were obtained from the base of the skull through the vertex without intravenous contrast. COMPARISON:  08/14/2020 CT and prior studies FINDINGS: Brain: Resolution of LEFT parietal and temporal intracranial hemorrhage noted. High LEFT frontoparietal hypodensity is again noted probably related to edema from recent intracranial hemorrhage. No new or persistent hemorrhage is identified intracranially. No acute infarct, midline shift or hydrocephalus. Mild atrophy and chronic small-vessel white matter ischemic changes again noted. Vascular: Carotid and vertebral atherosclerotic calcifications are noted. Skull: Normal. Negative for fracture or focal lesion. Sinuses/Orbits: No acute finding. Other: None. IMPRESSION: 1. Resolution of LEFT parietal and LEFT temporal hemorrhage. No evidence of new or acute intracranial abnormality. 2. High LEFT frontoparietal hypodensity likely related to edema from recent intracranial hemorrhage. 3. Mild atrophy and chronic small-vessel white matter ischemic changes. Electronically Signed   By: Margarette Canada M.D.   On: 08/19/2020 12:26     Assessment/Plan: 86yr old with recent left temporal ICH (7/25) and associated seizures since initial presentation. Pt just completed inpt rehab and was d/c home on 8/18. She has not missed any doses of keppra 250mg  BID po. She re-presents on 8/21 with breakthrough seizures.   # h/o recent [place]- f/u scan 8/22 shows improvement and no new acute change. 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 Will increase BID keppra to 500mg . UA and labs pending for infection wk up given this could likely lower seizure threshold # Debility with residual  right side weakness. Dependant on home full time care at this time which is done by family # Afib- Prev on Coumadin, this has been on hold since [place] # post stroke aphasia/dysphagia- will need SLP to reassess in setting of seizure # Encephalopathy- post ictal, medication effect, toxic-metabolic # HTN- normotensive goals   PLAN: No utility in EEG at this time IV keppra 500mg  BID, may switch back to po once cleared by SLP NPO given encephalopathy Seizure precautions Supportive care UA/labs for underlying causes of breakthrough seizures I have updated one dtr at bedside and d/w other dtr by phone who lives with pt for above info since pt is unable to  collaborate.   Attending neurologist's note to follow  Reneshia Zuccaro Metzger-Cihelka, ARNP-C, ANVP-BC Pager: [phone]

## 2020-08-19 NOTE — H&P (Signed)
History and Physical    Lori Bridges GUR:[id number] DOB: 06-Jun-1930 DOA: 08/18/2020  PCP: Lori Late, MD  Patient coming from: home  I have personally briefly reviewed patient's old medical records in [place]  Chief Complaint: seizures  HPI: Lori Bridges is a 86 y.o. female with medical history significant of paroxysmal A. fib, chronic diastolic CHF, hypertension, hyperlipidemia, GERD, recent ICH, degenerative disc disease, CKD stage IIIa presents to emergency department with right-sided seizure-like activity.  Patient's daughter at the bedside is the historian.  She tells me that patient recently hospitalized due to [place] due to uncontrolled hypertension and right-sided  seizure-like activity on 7/29 and discharged home on 8/19 from rehab in stable condition on Keppra 250 mg twice daily.  EEG shows epileptogenicity in the left frontal lobe but no seizure activity.  Her Coumadin was discontinued. patient was doing fine after the discharge however yesterday she started feeling dizzy and complaining of involuntary movements of right arm and leg and unable to bear weight on right leg.  Her symptoms started getting worse therefore family brought patient to the emergency department for further evaluation and management.  Patient was able to ambulate after the discharge from the hospital with the help of walker however since yesterday she was not able to walk due to dizziness, some confusion and right-sided seizure-like activity.  Her last dose of Keppra was yesterday morning.  Has chronic bilateral lower leg swelling however no history of orthopnea, PND no history of headache, blurry vision, head trauma, chest pain, shortness of breath, fever, chills, palpitation, nausea, vomiting, diarrhea, urinary changes.  Upon my evaluation: Patient sleeping comfortably on the bed.  On room air, not in acute distress.  No involuntary body movements noted on exam.  No history of smoking, alcohol,  illicit drug use.  ED Course: Upon arrival to ED: Patient tachycardic, afebrile, initial labs from yesterday including CBC: WNL, COVID-19 pending, CMP shows stable CKD stage IIIa, lipase: 91.  CT head negative for acute findings.  EDP consulted neurology.  She received Keppra 1000 IV once and Ativan 1 mg and started on 500 mg IV. twice daily.  [facility] hospitalist consulted for admission for seizure disorder.  Review of Systems: As per HPI otherwise negative.    Past Medical History:  Diagnosis Date  . Adenomatous colon polyp 2005  . Anxiety age 74  . Carotid artery bruit 2001   left  . CKD (chronic kidney disease), stage III   . DDD (degenerative disc disease)   . DJD (degenerative joint disease)   . Gastroenteritis 2011  . GERD (gastroesophageal reflux disease) 2003  . History of pneumonia   . HTN (hypertension) 1983  . Hyperlipidemia 2000  . Hypothyroidism 2009  . Iron deficiency anemia 1983  . Osteoarthritis 2001  . Osteoporosis 2001  . Permanent atrial fibrillation ([place]) 07/23/2010       . Persistent headaches 1987  . Pulmonary HTN ([place]) 04/03/2017   Moderate with PASP 53mmHg by echo 02/2017  . Tricuspid regurgitation    severe by echo 02/25/12  . Vertigo   . Vitamin D deficiency 2011    Past Surgical History:  Procedure Laterality Date  . COLONOSCOPY W/ BIOPSIES AND POLYPECTOMY  09/16/2004   adenomatous polyp, diverticulosis, internal hemorrhoids  . s/p BLT    . TUBAL LIGATION       reports that she has never smoked. She has never used smokeless tobacco. She reports that she does not drink alcohol and does not  use drugs.  Allergies  Allergen Reactions  . Doxycycline Other (See Comments)    UNKNOWN unknown  . Ezetimibe Other (See Comments)    Arthralgia, Myalgias  . Meclizine Other (See Comments) and Palpitations    Tachycardia  . Pravastatin Other (See Comments)    Arthralgia, Myalgias  . Colestipol Other (See Comments)  . Sulfamethoxazole Other (See Comments)      unknown  . Ace Inhibitors Cough  . Codeine Nausea Only  . Colestipol Hcl Other (See Comments)    Malaise  . Lovastatin Other (See Comments)    UNKNOWN Unknown  . Sulfa Antibiotics Other (See Comments)    unknown    Family History  Problem Relation Age of Onset  . Hypertension Father   . Peripheral vascular disease Father   . Diabetes Mother   . Heart disease Brother 24  . Liver cancer Sister     Prior to Admission medications   Medication Sig Start Date End Date Taking? Authorizing Provider  acetaminophen (TYLENOL) 325 MG tablet Take 2 tablets (650 mg total) by mouth every 4 (four) hours as needed for mild pain (or temp > 37.5 C (99.5 F)). 08/15/20   Angiulli, Lavon Paganini, PA-C  furosemide (LASIX) 20 MG tablet Take 1 tablet (20 mg total) by mouth 2 (two) times daily. 08/15/20   Angiulli, Lavon Paganini, PA-C  levETIRAcetam (KEPPRA) 250 MG tablet Take 1 tablet (250 mg total) by mouth 2 (two) times daily. 08/15/20   Angiulli, Lavon Paganini, PA-C  levothyroxine (SYNTHROID) 50 MCG tablet Take 1 tablet (50 mcg total) by mouth daily before breakfast. 08/15/20   Angiulli, Lavon Paganini, PA-C  melatonin 300 MCG TABS Take 5 tablets (1,500 mcg total) by mouth at bedtime. 08/15/20   Angiulli, Lavon Paganini, PA-C  metoprolol tartrate 75 MG TABS Take 75 mg by mouth 2 (two) times daily. 08/15/20   Angiulli, Lavon Paganini, PA-C  pantoprazole (PROTONIX) 40 MG tablet Take 1 tablet (40 mg total) by mouth daily. 08/15/20 08/15/21  Cathlyn Parsons, PA-C    Physical Exam: Vitals:   08/19/20 0730 08/19/20 1230 08/19/20 1310 08/19/20 1313  BP: (!) 138/98 (!) 163/117 (!) 141/92   Pulse: (!) 111 (!) 125 (!) 125   Resp: 14 16 16    Temp: 97.6 F (36.4 C) 98.1 F (36.7 C)    TempSrc: Oral Oral    SpO2: 99% 96% 97%   Weight:    81.6 kg  Height:    5\' 7"  (1.702 m)    Constitutional: NAD, calm, comfortable, room air, sleepy Eyes: PERRL, lids and conjunctivae normal ENMT: Mucous membranes are moist. Posterior pharynx clear of  any exudate or lesions.Normal dentition.  Neck: normal, supple, no masses, no thyromegaly Respiratory: clear to auscultation bilaterally, no wheezing, no crackles. Normal respiratory effort. No accessory muscle use.  Cardiovascular: Tachycardic, no murmurs / rubs / gallops.  2+ bilateral pitting edema positive. 2+ pedal pulses. No carotid bruits.  Abdomen: no tenderness, no masses palpated. No hepatosplenomegaly. Bowel sounds positive.  Musculoskeletal: no clubbing / cyanosis. No joint deformity upper and lower extremities. Good ROM, no contractures. Normal muscle tone.  Skin: no rashes, lesions, ulcers. No induration Neurologic: Unable to perform neurological exam as patient sleepy Labs on Admission: I have personally reviewed following labs and imaging studies  CBC: Recent Labs  Lab 08/15/20 0620 08/18/20 1746  WBC 3.5* 4.1  NEUTROABS 1.9  --   HGB 11.6* 12.7  HCT 36.3 41.3  MCV 96.3 97.4  PLT 148*  353   Basic Metabolic Panel: Recent Labs  Lab 08/13/20 0505 08/14/20 0645 08/18/20 1746  NA 139 138 141  K 4.1 4.0 4.1  CL 105 104 105  CO2 26 26 24   GLUCOSE 79 88 103*  BUN 27* 26* 19  CREATININE 1.21* 1.13* 1.07*  CALCIUM 10.0 9.8 10.0   GFR: Estimated Creatinine Clearance: 39.9 mL/min (A) (by C-G formula based on SCr of 1.07 mg/dL (H)). Liver Function Tests: Recent Labs  Lab 08/18/20 1746  AST 33  ALT 30  ALKPHOS 191*  BILITOT 1.5*  PROT 6.5  ALBUMIN 3.2*   Recent Labs  Lab 08/18/20 1746  LIPASE 91*   No results for input(s): AMMONIA in the last 168 hours. Coagulation Profile: No results for input(s): INR, PROTIME in the last 168 hours. Cardiac Enzymes: No results for input(s): CKTOTAL, CKMB, CKMBINDEX, TROPONINI in the last 168 hours. BNP (last 3 results) No results for input(s): PROBNP in the last 8760 hours. HbA1C: No results for input(s): HGBA1C in the last 72 hours. CBG: No results for input(s): GLUCAP in the last 168 hours. Lipid Profile: No  results for input(s): CHOL, HDL, LDLCALC, TRIG, CHOLHDL, LDLDIRECT in the last 72 hours. Thyroid Function Tests: No results for input(s): TSH, T4TOTAL, FREET4, T3FREE, THYROIDAB in the last 72 hours. Anemia Panel: No results for input(s): VITAMINB12, FOLATE, FERRITIN, TIBC, IRON, RETICCTPCT in the last 72 hours. Urine analysis:    Component Value Date/Time   COLORURINE AMBER (A) 07/24/2020 1151   APPEARANCEUR HAZY (A) 07/24/2020 1151   LABSPEC 1.035 (H) 07/24/2020 1151   PHURINE 5.0 07/24/2020 1151   GLUCOSEU NEGATIVE 07/24/2020 1151   HGBUR LARGE (A) 07/24/2020 1151   BILIRUBINUR NEGATIVE 07/24/2020 1151   KETONESUR NEGATIVE 07/24/2020 1151   PROTEINUR 100 (A) 07/24/2020 1151   UROBILINOGEN 0.2 04/03/2012 2219   NITRITE NEGATIVE 07/24/2020 1151   LEUKOCYTESUR NEGATIVE 07/24/2020 1151    Radiological Exams on Admission: CT Head Wo Contrast  Result Date: 08/19/2020 CLINICAL DATA:  86 year old female with seizure. History of prior intracranial hemorrhage. EXAM: CT HEAD WITHOUT CONTRAST TECHNIQUE: Contiguous axial images were obtained from the base of the skull through the vertex without intravenous contrast. COMPARISON:  08/14/2020 CT and prior studies FINDINGS: Brain: Resolution of LEFT parietal and temporal intracranial hemorrhage noted. High LEFT frontoparietal hypodensity is again noted probably related to edema from recent intracranial hemorrhage. No new or persistent hemorrhage is identified intracranially. No acute infarct, midline shift or hydrocephalus. Mild atrophy and chronic small-vessel white matter ischemic changes again noted. Vascular: Carotid and vertebral atherosclerotic calcifications are noted. Skull: Normal. Negative for fracture or focal lesion. Sinuses/Orbits: No acute finding. Other: None. IMPRESSION: 1. Resolution of LEFT parietal and LEFT temporal hemorrhage. No evidence of new or acute intracranial abnormality. 2. High LEFT frontoparietal hypodensity likely related  to edema from recent intracranial hemorrhage. 3. Mild atrophy and chronic small-vessel white matter ischemic changes. Electronically Signed   By: Margarette Canada M.D.   On: 08/19/2020 12:26    Assessment/Plan Principal Problem:   Seizures ([place]) Active Problems:   Hyperlipidemia   Chronic diastolic CHF (congestive heart failure) (HCC)   ICH (intracerebral hemorrhage) (HCC) - 2 small L brain ICH, likely d/t HTN on subtherapeutic warfarin   CKD (chronic kidney disease), stage IIIa   Hypothyroidism   PAF (paroxysmal atrial fibrillation) (HCC)   Gastroesophageal reflux disease   Seizure disorder (HCC)    Seizure disorder: -History of recent ICH due to uncontrolled hypertension & post stroke seizure  disorder.  Recently discharged from rehab on 8/19 on Keppra 250 mg twice daily -CT head negative for acute findings.  Patient received Keppra 1000 mg IV once and Ativan 1 mg in ED.  Patient is afebrile with no leukocytosis.  COVID-19 pending -Admit patient at stepdown unit for close monitoring.  On telemetry.  On continuous pulse ox. -Continue Keppra 500 twice daily -EDP consulted neurology -We will keep her n.p.o. -Consult PT/OT/SLP -On fall/aspiration/seizure precautions -Monitor vitals closely.  Check electrolytes, TSH, ammonia level, UDS.  Check UA for infection.  Check checks x-ray for aspiration pneumonia  Paroxysmal A. Fib: -Hold p.o. metoprolol as patient is n.p.o.  Coumadin was discontinued on previous hospitalization due to [place].  On telemetry.  Ordered EKG. -Start on labetalol as needed  History of ICH: -Reviewed CT head-shows improvement. -Off of Coumadin  Hypothyroidism: Check TSH -Hold p.o. levothyroxine  CKD stage IIIa: At baseline. -Continue to monitor  Elevated total bilirubin and alkaline phosphatase: -AST, ALT: WNL. -Check PT/INR, ammonia level.  GERD: Hold PPI  Chronic diastolic CHF: Patient appears euvolemic.  Has chronic bilateral lower extremity edema.  On room  air. -Reviewed echo from 06/04/2020 which showed ejection fraction of 60 to 65%, severe TR. -Hold p.o. Lasix as patient is NPO.  Strict INO's and daily weight. -Monitor electrolytes.  Monitor signs of fluid overload. -Resume p.o. Lasix once patient is able to take p.o. meds.  DVT prophylaxis: SCD Code Status: Full code-confirmed with patient's daughter Family Communication: Patient's daughter present at bedside.  Plan of care discussed with patient daughter in length and she verbalized understanding and agreed with it. Disposition Plan: Home/SNF in 1 to 2 days Consults called: Neurology by EDP Admission status: Inpatient   Mckinley Jewel MD [facility]  If 7PM-7AM, please contact night-coverage [email or web address] Password [email or web address]  08/19/2020, 1:20 PM

## 2020-08-19 NOTE — ED Provider Notes (Signed)
[facility]ERGENCY DEPARTMENT Provider Note   CSN: [id number] Arrival date & time: 08/18/20  1715     History Chief Complaint  Patient presents with  . involuntary R sided movements  . Abdominal Pain    Lori Bridges is a 86 y.o. female.  Patient is a 86 year old female who presents with seizure activity.  She has a history of hypertension, CHF, chronic kidney disease.  She was recently admitted last month for an intracranial hemorrhage which was felt to be hypertension related.  At that time she presented with some confusion and right-sided weakness in her arm and leg.  She also started having some seizure activity in her right arm and right leg during the hospitalization.  She was started on Keppra.  Her seizures were controlled.  She had an EEG which showed epileptogenicity in the left frontal lobe but no seizure activity.  Her Coumadin was reversed during hospitalization and she has not been back on Coumadin since that time.  She was discharged to rehab and went home from rehab on August 19.  She has been at home with her daughters taking care of her.  Initially it said in the notes that they were planning on weaning off the Keppra but she was discharged on 500 mg twice daily.  She last took her dose yesterday morning.  Her daughter states that she noted yesterday in the midday she started having weakness in her right arm and right leg and was unable to bear weight on the right leg.  These are similar symptoms to what happened when she came in with her initial stroke in July.  She also started having some twitching in her right arm and right leg and the right side of her face.  The twitching worsened today and she continued to have the right side weakness.  The daughter states that she had been able to walk with a walker but has not been able to ambulate since yesterday when this started.  She does not report any other illnesses.  The daughter says otherwise she does not  have any other issues.  She has had no fevers.  No cough or cold symptoms.  No nausea vomiting or change in her stools.  She says that her stools in her urine are normal.        Past Medical History:  Diagnosis Date  . Adenomatous colon polyp 2005  . Anxiety age 10  . Carotid artery bruit 2001   left  . CKD (chronic kidney disease), stage III   . DDD (degenerative disc disease)   . DJD (degenerative joint disease)   . Gastroenteritis 2011  . GERD (gastroesophageal reflux disease) 2003  . History of pneumonia   . HTN (hypertension) 1983  . Hyperlipidemia 2000  . Hypothyroidism 2009  . Iron deficiency anemia 1983  . Osteoarthritis 2001  . Osteoporosis 2001  . Permanent atrial fibrillation ([place]) 07/23/2010       . Persistent headaches 1987  . Pulmonary HTN ([place]) 04/03/2017   Moderate with PASP 30mmHg by echo 02/2017  . Tricuspid regurgitation    severe by echo 02/25/12  . Vertigo   . Vitamin D deficiency 2011    Patient Active Problem List   Diagnosis Date Noted  . Seizure disorder ([place]) 08/19/2020  . Fall   . Peripheral edema   . Sleep disturbance   . Hyperkalemia   . Hyponatremia   . Leukopenia   . Atrial fibrillation ([place])   .  Thrombocytopenia ([place])   . Hypernatremia   . Benign essential HTN   . Dysphagia, post-stroke   . PAF (paroxysmal atrial fibrillation) ([place])   . Seizure prophylaxis   . Gastroesophageal reflux disease   . Seizures ([place]) 07/26/2020  . Dysphagia due to recent stroke 07/26/2020  . UTI (urinary tract infection) 07/26/2020  . CKD (chronic kidney disease), stage IIIa 07/26/2020  . Hypothyroidism 07/26/2020  . ICH (intracerebral hemorrhage) (H[facility]) - 2 small L brain ICH, likely d/t HTN on subtherapeutic warfarin 07/22/2020  . Chronic diastolic CHF (congestive heart failure) ([place]) 04/03/2017  . Pulmonary HTN ([place]) 04/03/2017  . Chest pain 04/03/2017  . Dizziness 06/30/2016  . Encounter for therapeutic drug monitoring 01/24/2014  . Hyperlipidemia  08/24/2013  . Long term (current) use of anticoagulants 05/17/2013  . Tricuspid regurgitation 03/04/2012  . Nausea 02/16/2012  . Personal history of colonic polyps 10/09/2011  . HYPERTENSION, MILD 09/11/2010  . Permanent atrial fibrillation ([place]) 07/23/2010    Past Surgical History:  Procedure Laterality Date  . COLONOSCOPY W/ BIOPSIES AND POLYPECTOMY  09/16/2004   adenomatous polyp, diverticulosis, internal hemorrhoids  . s/p BLT    . TUBAL LIGATION       OB History   No obstetric history on file.     Family History  Problem Relation Age of Onset  . Hypertension Father   . Peripheral vascular disease Father   . Diabetes Mother   . Heart disease Brother 26  . Liver cancer Sister     Social History   Tobacco Use  . Smoking status: Never Smoker  . Smokeless tobacco: Never Used  Vaping Use  . Vaping Use: Never used  Substance Use Topics  . Alcohol use: No  . Drug use: No    Home Medications Prior to Admission medications   Medication Sig Start Date End Date Taking? Authorizing Provider  furosemide (LASIX) 20 MG tablet Take 1 tablet (20 mg total) by mouth 2 (two) times daily. 08/15/20  Yes Angiulli, Lavon Paganini, PA-C  levETIRAcetam (KEPPRA) 250 MG tablet Take 1 tablet (250 mg total) by mouth 2 (two) times daily. 08/15/20  Yes Angiulli, Lavon Paganini, PA-C  metoprolol tartrate 75 MG TABS Take 75 mg by mouth 2 (two) times daily. 08/15/20  Yes Angiulli, Lavon Paganini, PA-C  pantoprazole (PROTONIX) 40 MG tablet Take 1 tablet (40 mg total) by mouth daily. Patient taking differently: Take 40 mg by mouth daily as needed (for reflux).  08/15/20 08/15/21 Yes Angiulli, Lavon Paganini, PA-C  SYNTHROID 50 MCG tablet Take 50 mcg by mouth daily before breakfast.   Yes [provider]  acetaminophen (TYLENOL) 325 MG tablet Take 2 tablets (650 mg total) by mouth every 4 (four) hours as needed for mild pain (or temp > 37.5 C (99.5 F)). 08/15/20   Angiulli, Lavon Paganini, PA-C  levothyroxine (SYNTHROID) 50  MCG tablet Take 1 tablet (50 mcg total) by mouth daily before breakfast. Patient not taking: Reported on 08/19/2020 08/15/20   Angiulli, Lavon Paganini, PA-C  melatonin 300 MCG TABS Take 5 tablets (1,500 mcg total) by mouth at bedtime. 08/15/20   Angiulli, Lavon Paganini, PA-C    Allergies    Doxycycline, Tape, Ezetimibe, Meclizine, Pravastatin, Colestipol, Sulfamethoxazole, Ace inhibitors, Codeine, Colestipol hcl, Lovastatin, and Sulfa antibiotics  Review of Systems   Review of Systems  Constitutional: Negative for chills, diaphoresis, fatigue and fever.  HENT: Negative for congestion, rhinorrhea and sneezing.   Eyes: Negative.   Respiratory: Negative for cough, chest tightness and  shortness of breath.   Cardiovascular: Negative for chest pain and leg swelling.  Gastrointestinal: Negative for abdominal pain, blood in stool, diarrhea, nausea and vomiting.  Genitourinary: Negative for difficulty urinating, flank pain, frequency and hematuria.  Musculoskeletal: Negative for arthralgias and back pain.  Skin: Negative for rash.  Neurological: Positive for seizures and weakness. Negative for dizziness, speech difficulty, numbness and headaches.    Physical Exam Updated Vital Signs BP 121/81   Pulse (!) 131   Temp 98.1 F (36.7 C) (Oral)   Resp 15   Ht 5\' 7"  (1.702 m)   Wt 81.6 kg   SpO2 97%   BMI 28.19 kg/m   Physical Exam Constitutional:      Appearance: She is well-developed.  HENT:     Head: Normocephalic and atraumatic.  Eyes:     Pupils: Pupils are equal, round, and reactive to light.  Cardiovascular:     Rate and Rhythm: Normal rate. Rhythm irregular.     Heart sounds: Murmur heard.   Pulmonary:     Effort: Pulmonary effort is normal. No respiratory distress.     Breath sounds: Normal breath sounds. No wheezing or rales.  Chest:     Chest wall: No tenderness.  Abdominal:     General: Bowel sounds are normal.     Palpations: Abdomen is soft.     Tenderness: There is no  abdominal tenderness. There is no guarding or rebound.  Musculoskeletal:        General: Normal range of motion.     Cervical back: Normal range of motion and neck supple.  Lymphadenopathy:     Cervical: No cervical adenopathy.  Skin:    General: Skin is warm and dry.     Findings: No rash.  Neurological:     Mental Status: She is alert.     Comments: Patient is oriented to person and place only.  She was not able to tell me the month or year.  She does have some regular twitching noted in her right arm and right leg.  She has 4 out of 5 strength in the right arm and 3/5 in the right leg as compared to the left side which is 5 out of 5.     ED Results / Procedures / Treatments   Labs (all labs ordered are listed, but only abnormal results are displayed) Labs Reviewed  LIPASE, BLOOD - Abnormal; Notable for the following components:      Result Value   Lipase 91 (*)    All other components within normal limits  COMPREHENSIVE METABOLIC PANEL - Abnormal; Notable for the following components:   Glucose, Bld 103 (*)    Creatinine, Ser 1.07 (*)    Albumin 3.2 (*)    Alkaline Phosphatase 191 (*)    Total Bilirubin 1.5 (*)    GFR calc non Af Amer 46 (*)    GFR calc Af Amer 54 (*)    All other components within normal limits  CBC - Abnormal; Notable for the following components:   RDW 18.3 (*)    All other components within normal limits  COMPREHENSIVE METABOLIC PANEL - Abnormal; Notable for the following components:   Creatinine, Ser 1.10 (*)    Total Protein 6.3 (*)    Albumin 3.2 (*)    Alkaline Phosphatase 175 (*)    Total Bilirubin 2.0 (*)    GFR calc non Af Amer 45 (*)    GFR calc Af Amer 52 (*)  All other components within normal limits  CBC WITH DIFFERENTIAL/PLATELET - Abnormal; Notable for the following components:   RDW 18.2 (*)    All other components within normal limits  TSH - Abnormal; Notable for the following components:   TSH 4.884 (*)    All other components  within normal limits  SARS CORONAVIRUS 2 BY RT PCR (HOSPITAL ORDER, [facility])  MAGNESIUM  PHOSPHORUS  APTT  PROTIME-INR  AMMONIA  URINALYSIS, ROUTINE W REFLEX MICROSCOPIC  URINALYSIS, ROUTINE W REFLEX MICROSCOPIC  RAPID URINE DRUG SCREEN, HOSP PERFORMED  COMPREHENSIVE METABOLIC PANEL  CBC    EKG None  Radiology CT Head Wo Contrast  Result Date: 08/19/2020 CLINICAL DATA:  86 year old female with seizure. History of prior intracranial hemorrhage. EXAM: CT HEAD WITHOUT CONTRAST TECHNIQUE: Contiguous axial images were obtained from the base of the skull through the vertex without intravenous contrast. COMPARISON:  08/14/2020 CT and prior studies FINDINGS: Brain: Resolution of LEFT parietal and temporal intracranial hemorrhage noted. High LEFT frontoparietal hypodensity is again noted probably related to edema from recent intracranial hemorrhage. No new or persistent hemorrhage is identified intracranially. No acute infarct, midline shift or hydrocephalus. Mild atrophy and chronic small-vessel white matter ischemic changes again noted. Vascular: Carotid and vertebral atherosclerotic calcifications are noted. Skull: Normal. Negative for fracture or focal lesion. Sinuses/Orbits: No acute finding. Other: None. IMPRESSION: 1. Resolution of LEFT parietal and LEFT temporal hemorrhage. No evidence of new or acute intracranial abnormality. 2. High LEFT frontoparietal hypodensity likely related to edema from recent intracranial hemorrhage. 3. Mild atrophy and chronic small-vessel white matter ischemic changes. Electronically Signed   By: Margarette Canada M.D.   On: 08/19/2020 12:26   DG CHEST PORT 1 VIEW  Result Date: 08/19/2020 CLINICAL DATA:  Pneumonia EXAM: PORTABLE CHEST 1 VIEW COMPARISON:  Prior chest x-ray 09/17/2018 FINDINGS: Marked interval enlargement of the cardiopericardial silhouette which is ill-defined and partially obscured by opacities in left greater than right lung  base. The mediastinal contours appear similar. Atherosclerotic calcifications are present in the transverse aorta. Increased pulmonary vascular congestion with diffuse mild interstitial prominence suggesting interstitial edema. Bibasilar airspace opacities are present obscuring the hemidiaphragms. Findings are favored to reflect a combination of pleural fluid and atelectasis. No pneumothorax. No acute osseous abnormality. IMPRESSION: 1. Interval enlargement of the cardiopericardial silhouette which may represent worsening cardiomegaly versus pericardial effusion. 2. Pulmonary vascular congestion and mild edema concerning for CHF. 3. Symmetric bibasilar airspace opacities obscuring the diaphragms are favored to reflect a combination of layering pleural effusions with atelectasis. Recommend dedicated PA and lateral chest x-ray to confirm. Electronically Signed   By: Jacqulynn Cadet M.D.   On: 08/19/2020 14:21    Procedures Procedures (including critical care time)  Medications Ordered in ED Medications  acetaminophen (TYLENOL) tablet 650 mg (has no administration in time range)    Or  acetaminophen (TYLENOL) suppository 650 mg (has no administration in time range)  ondansetron (ZOFRAN) tablet 4 mg (has no administration in time range)    Or  ondansetron (ZOFRAN) injection 4 mg (has no administration in time range)  labetalol (NORMODYNE) injection 10 mg (has no administration in time range)  levETIRAcetam (KEPPRA) IVPB 500 mg/100 mL premix (has no administration in time range)  LORazepam (ATIVAN) injection 1 mg (1 mg Intravenous Given 08/19/20 1304)  levETIRAcetam (KEPPRA) IVPB 1000 mg/100 mL premix (0 mg Intravenous Stopped 08/19/20 1415)    ED Course  I have reviewed the triage vital signs and the nursing  notes.  Pertinent labs & imaging results that were available during my care of the patient were reviewed by me and considered in my medical decision making (see chart for details).    MDM  Rules/Calculators/A&P                          Patient is a 86 year old female who presents with some increased weakness on the right side associated with seizure activity.  She is having twitching of the right arm and right leg.  This was visualized on my arrival to see the patient.  She had been in the waiting room for an extended amount of time, apparently with this ongoing twitching.  She was given a dose of Ativan and loaded with Keppra.  Her seizures resolved following this.  She had a head CT which shows no acute abnormality.  She has a mild elevation in her lipase but no abdominal tenderness on my exam.  Her other labs are nonconcerning.  I spoke with Dr. Leonel Ramsay with neurology who will see the patient.  I spoke with Dr. Doristine Bosworth who will admit the pt.  CRITICAL CARE Performed by: Malvin Johns Total critical care time: 60 minutes Critical care time was exclusive of separately billable procedures and treating other patients. Critical care was necessary to treat or prevent imminent or life-threatening deterioration. Critical care was time spent personally by me on the following activities: development of treatment plan with patient and/or surrogate as well as nursing, discussions with consultants, evaluation of patient's response to treatment, examination of patient, obtaining history from patient or surrogate, ordering and performing treatments and interventions, ordering and review of laboratory studies, ordering and review of radiographic studies, pulse oximetry and re-evaluation of patient's condition.    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

## 2020-08-20 ENCOUNTER — Inpatient Hospital Stay (HOSPITAL_COMMUNITY): Payer: Medicare Other

## 2020-08-20 LAB — CBC
HCT: 38.8 % (ref 36.0–46.0)
Hemoglobin: 12.1 g/dL (ref 12.0–15.0)
MCH: 30.3 pg (ref 26.0–34.0)
MCHC: 31.2 g/dL (ref 30.0–36.0)
MCV: 97.2 fL (ref 80.0–100.0)
Platelets: 160 10*3/uL (ref 150–400)
RBC: 3.99 MIL/uL (ref 3.87–5.11)
RDW: 18 % — ABNORMAL HIGH (ref 11.5–15.5)
WBC: 3.3 10*3/uL — ABNORMAL LOW (ref 4.0–10.5)
nRBC: 0 % (ref 0.0–0.2)

## 2020-08-20 LAB — RAPID URINE DRUG SCREEN, HOSP PERFORMED
Amphetamines: NOT DETECTED
Barbiturates: NOT DETECTED
Benzodiazepines: NOT DETECTED
Cocaine: NOT DETECTED
Opiates: NOT DETECTED
Tetrahydrocannabinol: NOT DETECTED

## 2020-08-20 LAB — COMPREHENSIVE METABOLIC PANEL
ALT: 23 U/L (ref 0–44)
AST: 27 U/L (ref 15–41)
Albumin: 2.8 g/dL — ABNORMAL LOW (ref 3.5–5.0)
Alkaline Phosphatase: 153 U/L — ABNORMAL HIGH (ref 38–126)
Anion gap: 12 (ref 5–15)
BUN: 17 mg/dL (ref 8–23)
CO2: 25 mmol/L (ref 22–32)
Calcium: 10.1 mg/dL (ref 8.9–10.3)
Chloride: 106 mmol/L (ref 98–111)
Creatinine, Ser: 1.12 mg/dL — ABNORMAL HIGH (ref 0.44–1.00)
GFR calc Af Amer: 51 mL/min — ABNORMAL LOW (ref >60)
GFR calc non Af Amer: 44 mL/min — ABNORMAL LOW (ref >60)
Glucose, Bld: 66 mg/dL — ABNORMAL LOW (ref 70–99)
Potassium: 4 mmol/L (ref 3.5–5.1)
Sodium: 143 mmol/L (ref 135–145)
Total Bilirubin: 2 mg/dL — ABNORMAL HIGH (ref 0.3–1.2)
Total Protein: 5.8 g/dL — ABNORMAL LOW (ref 6.5–8.1)

## 2020-08-20 LAB — URINALYSIS, ROUTINE W REFLEX MICROSCOPIC
Bilirubin Urine: NEGATIVE
Glucose, UA: NEGATIVE mg/dL
Hgb urine dipstick: NEGATIVE
Ketones, ur: NEGATIVE mg/dL
Leukocytes,Ua: NEGATIVE
Nitrite: NEGATIVE
Protein, ur: NEGATIVE mg/dL
Specific Gravity, Urine: 1.01 (ref 1.005–1.030)
pH: 6 (ref 5.0–8.0)

## 2020-08-20 MED ORDER — PANTOPRAZOLE SODIUM 40 MG PO TBEC: 40.0000 mg | DELAYED_RELEASE_TABLET | Freq: Every day | ORAL | Status: DC | PRN

## 2020-08-20 MED ORDER — FUROSEMIDE 20 MG PO TABS
20.0000 mg | ORAL_TABLET | Freq: Two times a day (BID) | ORAL | Status: DC
  Administered 2020-08-21 – 2020-08-22 (×2): 20 mg via ORAL
  Filled 2020-08-20 (×4): qty 1

## 2020-08-20 MED ORDER — LEVOTHYROXINE SODIUM 50 MCG PO TABS
50.0000 ug | ORAL_TABLET | Freq: Every day | ORAL | Status: DC
  Administered 2020-08-21 – 2020-08-22 (×2): 50 ug via ORAL
  Filled 2020-08-20 (×2): qty 1

## 2020-08-20 MED ORDER — METOPROLOL TARTRATE 25 MG PO TABS
75.0000 mg | ORAL_TABLET | Freq: Two times a day (BID) | ORAL | Status: DC
  Administered 2020-08-20 – 2020-08-22 (×5): 75 mg via ORAL
  Filled 2020-08-20 (×6): qty 3

## 2020-08-20 MED ORDER — LORAZEPAM 2 MG/ML IJ SOLN
2.0000 mg | INTRAMUSCULAR | Status: DC | PRN
  Administered 2020-08-21 (×2): 1 mg via INTRAVENOUS
  Administered 2020-08-21 – 2020-08-23 (×2): 2 mg via INTRAVENOUS
  Filled 2020-08-20 (×3): qty 1

## 2020-08-20 MED ORDER — FUROSEMIDE 10 MG/ML IJ SOLN
40.0000 mg | Freq: Once | INTRAMUSCULAR | Status: AC
  Administered 2020-08-20: 40 mg via INTRAVENOUS
  Filled 2020-08-20: qty 4

## 2020-08-20 NOTE — ED Notes (Signed)
Attempted report. Could not reach RN

## 2020-08-20 NOTE — Evaluation (Signed)
Clinical/Bedside Swallow Evaluation Patient Details  Name: CARIS CERVENY MRN: [id number] Date of Birth: March 13, 1930  Today's Date: 08/20/2020 Time: SLP Start Time (ACUTE ONLY): 0930 SLP Stop Time (ACUTE ONLY): 0938 SLP Time Calculation (min) (ACUTE ONLY): 8 min  Past Medical History:  Past Medical History:  Diagnosis Date  . Adenomatous colon polyp 2005  . Anxiety age 86  . Carotid artery bruit 2001   left  . CKD (chronic kidney disease), stage III   . DDD (degenerative disc disease)   . DJD (degenerative joint disease)   . Gastroenteritis 2011  . GERD (gastroesophageal reflux disease) 2003  . History of pneumonia   . HTN (hypertension) 1983  . Hyperlipidemia 2000  . Hypothyroidism 2009  . Iron deficiency anemia 1983  . Osteoarthritis 2001  . Osteoporosis 2001  . Permanent atrial fibrillation ([place]) 07/23/2010       . Persistent headaches 1987  . Pulmonary HTN ([place]) 04/03/2017   Moderate with PASP 60mmHg by echo 02/2017  . Tricuspid regurgitation    severe by echo 02/25/12  . Vertigo   . Vitamin D deficiency 2011   Past Surgical History:  Past Surgical History:  Procedure Laterality Date  . COLONOSCOPY W/ BIOPSIES AND POLYPECTOMY  09/16/2004   adenomatous polyp, diverticulosis, internal hemorrhoids  . s/p BLT    . TUBAL LIGATION     HPI:  KABAO LEITE is a 86 y.o. female admitted with right-sided seizure-like activity. Medical history significant of paroxysmal A. fib, chronic diastolic CHF, hypertension, hyperlipidemia, GERD, recent ICH, degenerative disc disease, CKD stage IIIa. CT from prior admission 07/23/20 showed CT/MRI showed acute intraparenchymal hemorrhage in the left vertex 2.2 x 1.7 x 2.4 cm in size with surrounding vasogenic edema. Additional intraparenchymal hemorrhage in the left temporal lobe 11 x 6 x 10 mm. Surrent CT shows Resolution of LEFT parietal and LEFT temporal hemorrhage. No evidence of new or acute intracranial abnormality. Pt has a history of  dysphagia from prior admission progressing to dys 2/thin after mental status improved., family reported soft diet due to difficulty masticating.    Assessment / Plan / Recommendation Clinical Impression  Pt in ED with daughter at bedside. Pt demonstrates no immediate signs of aspiration. Pt and daughter do endorse a mild baseline oral dysphagia with solids. They manage this by avoiding difficult to masticate meats. Pt is very attentive to dentures and clearing any residue, which can lead to prolonged mastication at times with solids. Pt needs assist for self feeding due to RUE partial seizure activity. Will inaiite a dys 3 (mech soft) diet. Pt has a noticible word finding impairment; daughter reports that this had resolved at home, but is worse now. Recommend f/u at next level of care, preferably home health for SLP f/u. Will sign of acutely.  SLP Visit Diagnosis: Dysphagia, unspecified (R13.10)    Aspiration Risk  Mild aspiration risk    Diet Recommendation Thin liquid;Dysphagia 3 (Mech soft)   Liquid Administration via: Cup;Straw Medication Administration: Whole meds with liquid Postural Changes: Seated upright at 90 degrees    Other  Recommendations Oral Care Recommendations: Patient independent with oral care   Follow up Recommendations Home health SLP      Frequency and Duration            Prognosis        Swallow Study   General HPI: AINE STRYCHARZ is a 86 y.o. female admitted with right-sided seizure-like activity. Medical history significant of paroxysmal A. fib, chronic  diastolic CHF, hypertension, hyperlipidemia, GERD, recent ICH, degenerative disc disease, CKD stage IIIa. CT from prior admission 07/23/20 showed CT/MRI showed acute intraparenchymal hemorrhage in the left vertex 2.2 x 1.7 x 2.4 cm in size with surrounding vasogenic edema. Additional intraparenchymal hemorrhage in the left temporal lobe 11 x 6 x 10 mm. Surrent CT shows Resolution of LEFT parietal and LEFT  temporal hemorrhage. No evidence of new or acute intracranial abnormality. Pt has a history of dysphagia from prior admission progressing to dys 2/thin after mental status improved., family reported soft diet due to difficulty masticating.  Type of Study: Bedside Swallow Evaluation Previous Swallow Assessment: BSE 07/24/20 Diet Prior to this Study: NPO Temperature Spikes Noted: No Respiratory Status: Room air History of Recent Intubation: No Behavior/Cognition: Alert;Cooperative;Pleasant mood;Requires cueing Oral Cavity Assessment: Within Functional Limits Oral Care Completed by SLP: No Oral Cavity - Dentition: Dentures, top;Dentures, bottom Vision: Functional for self-feeding Self-Feeding Abilities: Needs assist Patient Positioning: Upright in bed Baseline Vocal Quality: Normal Volitional Cough: Strong    Oral/Motor/Sensory Function Overall Oral Motor/Sensory Function: Within functional limits   Ice Chips Ice chips: Within functional limits   Thin Liquid Thin Liquid: Within functional limits    Nectar Thick Nectar Thick Liquid: Not tested   Honey Thick Honey Thick Liquid: Not tested   Puree Puree: Within functional limits   Solid     Solid: Within functional limits     Herbie Baltimore, MA [place] Pager [phone] Office [phone]  Lynann Beaver 08/20/2020,9:44 AM

## 2020-08-20 NOTE — Progress Notes (Addendum)
Subjective: No further seizure-like activity.  States she was having right leg jerking yesterday and was feeling dizzy.  Denies any concerns at this point.   ROS: negative except above  Examination  Vital signs in last 24 hours: Pulse Rate:  [33-138] 110 (08/23 1130) Resp:  [0-28] 17 (08/23 1130) BP: (96-134)/(56-113) 130/96 (08/23 1130) SpO2:  [94 %-100 %] 99 % (08/23 1130) Weight:  [81.6 kg] 81.6 kg (08/22 1313)  General: lying in bed, not in apparent distress CVS: pulse-normal rate and rhythm RS: breathing comfortably, CTAB Extremities: Bilateral lower extremity edema, warm  Neuro: MS: Alert, oriented to place and person, not to time, follows commands CN: pupils equal and reactive,  EOMI, face symmetric, tongue midline, normal sensation over face, Motor: 4/5 strength in all 4 extremities  Basic Metabolic Panel: Recent Labs  Lab 08/14/20 0645 08/14/20 0645 08/18/20 1746 08/19/20 1444 08/20/20 0301  NA 138  --  141 141 143  K 4.0  --  4.1 4.0 4.0  CL 104  --  105 106 106  CO2 26  --  24 23 25   GLUCOSE 88  --  103* 76 66*  BUN 26*  --  19 17 17   CREATININE 1.13*  --  1.07* 1.10* 1.12*  CALCIUM 9.8   < > 10.0 10.2 10.1  MG  --   --   --  2.2  --   PHOS  --   --   --  3.5  --    < > = values in this interval not displayed.    CBC: Recent Labs  Lab 08/15/20 0620 08/18/20 1746 08/19/20 1444 08/20/20 0301  WBC 3.5* 4.1 4.8 3.3*  NEUTROABS 1.9  --  3.0  --   HGB 11.6* 12.7 13.6 12.1  HCT 36.3 41.3 44.5 38.8  MCV 96.3 97.4 99.6 97.2  PLT 148* 189 157 160     Coagulation Studies: Recent Labs    08/19/20 1444  LABPROT 14.5  INR 1.2    Imaging CT head without contrast 08/19/2020: Resolution of LEFT parietal and LEFT temporal hemorrhage. No evidence of new or acute intracranial abnormality. High LEFT frontoparietal hypodensity likely related to edema from recent intracranial hemorrhage. Mild atrophy and chronic small-vessel white matter ischemic  changes.  ASSESSMENT AND PLAN: 86 year old female with recent left temporal hemorrhage (07/22/2020) and associated seizures who presented with breakthrough seizures.  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  Chest x-ray concerning for CHF as well as pleural effusions. -Routine EEG showed periodic lateralized epileptiform discharges in left hemisphere likely secondary to underlying chronic ICH.  No definite seizures were seen.  Recommendations -Increased Keppra to 500 mg twice daily.  If patient has any further seizures, Keppra can be increased to 750 mg daily. -If seizures persist, will add oxcarbazepine -Continue seizure precautions -Called and spoke with patient's daughter.  Discussed increasing antiepileptic dosing, potential side effects and discharge plan.  Patient's daughter states she would prefer if patient is discharged tomorrow after she remains seizure-free for 24 hours on increased dose of Keppra.  Plan was communicated with Dr. Doristine Bosworth -Follow-up with neurology in 8 to 12 weeks -Management of rest of comorbidities per primary team  I have spent a total of  35  minutes with the patient reviewing hospital notes,  test results, labs and examining the  patient as well as establishing an assessment and plan that was discussed personally with the patient and Dr Doristine Bosworth. > 50% of time was spent in direct patient care.   Zeb Comfort Epilepsy [facility] For questions after 5pm please refer to AMION to reach the Neurologist on call

## 2020-08-20 NOTE — Progress Notes (Signed)
EEG Completed; Results Pending  

## 2020-08-20 NOTE — Procedures (Signed)
Patient Name: DESHONNA TRNKA  MRN: [id number]  Epilepsy Attending: Lora Havens  Referring Physician/Provider: Dr. Zeb Comfort Date: 08/20/1020 Duration: 23.30 minutes  Patient history: 87 year old female with history of seizures presented with breakthrough seizures and status epilepticus.  EEG to assess for seizures.  Level of alertness: Awake  AEDs during EEG study: Keppra  Technical aspects: This EEG study was done with scalp electrodes positioned according to the 10-20 International system of electrode placement. Electrical activity was acquired at a sampling rate of 500Hz  and reviewed with a high frequency filter of 70Hz  and a low frequency filter of 1Hz . EEG data were recorded continuously and digitally stored.   Description: No definite posterior dominant rhythm was seen.  EEG showed continuous generalized and maximal left parieto-occipital region 3 to 5 Hz theta-delta slowing.  Periodic lateralized epileptiform discharges at 1 Hz, at times rhythmic were also noted in left hemisphere, maximal left parieto-occipital region.  Hyperventilation and photic stimulation were not performed.     ABNORMALITY -Periodic lateralized epileptiform discharges, left hemisphere -Continuous slow, generalized and maximal left parieto-occipital region  IMPRESSION: This study evidence of epileptogenicity as well as cortical dysfunction in left hemisphere, maximal left parieto-occipital region secondary to underlying structural abnormality. No seizures were seen throughout the recording.  Pattye Meda [name]

## 2020-08-20 NOTE — Progress Notes (Addendum)
PROGRESS NOTE    Lori Bridges  YIF:[id number] DOB: 11-Feb-1930 DOA: 08/18/2020 PCP: Derinda Late, MD   Brief Narrative:  HPI: Lori Bridges is a 86 y.o. female with medical history significant of paroxysmal A. fib, chronic diastolic CHF, hypertension, hyperlipidemia, GERD, recent ICH, degenerative disc disease, CKD stage IIIa presents to emergency department with right-sided seizure-like activity.  Patient's daughter at the bedside is the historian.  She tells me that patient recently hospitalized due to [place] due to uncontrolled hypertension and right-sided  seizure-like activity on 7/29 and discharged home on 8/19 from rehab in stable condition on Keppra 250 mg twice daily.  EEG shows epileptogenicity in the left frontal lobe but no seizure activity.  Her Coumadin was discontinued. patient was doing fine after the discharge however yesterday she started feeling dizzy and complaining of involuntary movements of right arm and leg and unable to bear weight on right leg.  Her symptoms started getting worse therefore family brought patient to the emergency department for further evaluation and management.  Patient was able to ambulate after the discharge from the hospital with the help of walker however since yesterday she was not able to walk due to dizziness, some confusion and right-sided seizure-like activity.  Her last dose of Keppra was yesterday morning.  Has chronic bilateral lower leg swelling however no history of orthopnea, PND no history of headache, blurry vision, head trauma, chest pain, shortness of breath, fever, chills, palpitation, nausea, vomiting, diarrhea, urinary changes.  Upon my evaluation: Patient sleeping comfortably on the bed.  On room air, not in acute distress.  No involuntary body movements noted on exam.  No history of smoking, alcohol, illicit drug use.  ED Course: Upon arrival to ED: Patient tachycardic, afebrile, initial labs from yesterday including CBC:  WNL, COVID-19 pending, CMP shows stable CKD stage IIIa, lipase: 91.  CT head negative for acute findings.  EDP consulted neurology.  She received Keppra 1000 IV once and Ativan 1 mg and started on 500 mg IV. twice daily.  [facility] consulted for admission for seizure disorder.  Assessment & Plan:   Principal Problem:   Seizures ([place]) Active Problems:   Hyperlipidemia   Chronic diastolic CHF (congestive heart failure) (HCC)   ICH (intracerebral hemorrhage) (HCC) - 2 small L brain ICH, likely d/t HTN on subtherapeutic warfarin   CKD (chronic kidney disease), stage IIIa   Hypothyroidism   PAF (paroxysmal atrial fibrillation) (HCC)   Gastroesophageal reflux disease   Seizure disorder (HCC)   Seizure disorder/partial status epilepticus: -History of recent ICH due to uncontrolled hypertension & post stroke seizure disorder.  Recently discharged from rehab on 8/19 on Keppra 250 mg twice daily -CT head negative for acute findings.  Patient received Keppra 1000 mg IV once and Ativan 1 mg in ED a.m. started on Keppra 500 mg twice daily by neurology.Per daughter, patient had several other episodes of abnormal movements in the right side of the body overnight.  Currently during my encounter, patient alert and oriented and I do not see any movements.  Will defer to neurology for definitive treatment and adjustment of medications.  Paroxysmal A. Fib: Currently in A. fib and rates elevated.  Now that she is taking p.o., will resume her metoprolol and monitor. Coumadin was discontinued on previous hospitalization due to [place].  On telemetry.    History of ICH: -Reviewed CT head-shows improvement. -Off of Coumadin  Hypothyroidism: Continue Synthroid.  CKD stage IIIa: At baseline. -Continue to monitor  Elevated  total bilirubin and alkaline phosphatase: -AST, ALT: WNL. -Check PT/INR, ammonia level.  GERD: Hold PPI  Chronic diastolic CHF: Patient appears euvolemic.  Has chronic  bilateral lower extremity edema.  On room air.  No dyspnea or tachypnea.  She received 1 dose of IV Lasix yesterday.  I gave her another IV Lasix 40 mg this morning.  We will resume her oral Lasix starting tomorrow. -Reviewed echo from 06/04/2020 which showed ejection fraction of 60 to 65%, severe TR. -Hold p.o. Lasix as patient is NPO.  Strict INO's and daily weight.  DVT prophylaxis: SCDs Start: 08/19/20 1317   Code Status: Full Code  Family Communication: Daughter present at bedside.  Plan of care discussed with patient in length and she verbalized understanding and agreed with it.  Status is: Inpatient  Remains inpatient appropriate because:Inpatient level of care appropriate due to severity of illness   Dispo: The patient is from: SNF              Anticipated d/c is to: SNF              Anticipated d/c date is: 1 day              Patient currently is not medically stable to d/c.        Estimated body mass index is 28.19 kg/m as calculated from the following:   Height as of this encounter: 5\' 7"  (1.702 m).   Weight as of this encounter: 81.6 kg.      Nutritional status:               Consultants:   Neurology  Procedures:   None  Antimicrobials:  Anti-infectives (From admission, onward)   None         Subjective: Seen and examined in the ED.  Daughter at the bedside.  Patient has no complaints.  Per daughter, she has had several episodes of right-sided seizure-like movements overnight.  Objective: Vitals:   08/20/20 0930 08/20/20 0945 08/20/20 1000 08/20/20 1015  BP: 134/90 128/85 (!) 129/96 122/80  Pulse: (!) 138 (!) 119 (!) 125 (!) 119  Resp: 19 18 18 19   Temp:      TempSrc:      SpO2: 95% 97% 96% 97%  Weight:      Height:        Intake/Output Summary (Last 24 hours) at 08/20/2020 1035 Last data filed at 08/20/2020 0438 Gross per 24 hour  Intake 100 ml  Output 1000 ml  Net -900 ml   Filed Weights   08/19/20 1313  Weight: 81.6 kg     Examination:  General exam: Appears calm and comfortable  Respiratory system: Clear to auscultation. Respiratory effort normal. Cardiovascular system: S1 & S2 heard, irregularly irregular rate and rhythm. No JVD, murmurs, rubs, gallops or clicks.  +3 pitting edema bilateral lower extremity. Gastrointestinal system: Abdomen is nondistended, soft and nontender. No organomegaly or masses felt. Normal bowel sounds heard. Central nervous system: Alert and oriented. No focal neurological deficits but seems to have right-sided neglect. Extremities: Symmetric 5 x 5 power. Skin: No rashes, lesions or ulcers Psychiatry: Judgement and insight appear poor. Mood & affect flat  Data Reviewed: I have personally reviewed following labs and imaging studies  CBC: Recent Labs  Lab 08/15/20 0620 08/18/20 1746 08/19/20 1444 08/20/20 0301  WBC 3.5* 4.1 4.8 3.3*  NEUTROABS 1.9  --  3.0  --   HGB 11.6* 12.7 13.6 12.1  HCT 36.3 41.3 44.5 38.8  MCV 96.3 97.4 99.6 97.2  PLT 148* 189 157 893   Basic Metabolic Panel: Recent Labs  Lab 08/14/20 0645 08/18/20 1746 08/19/20 1444 08/20/20 0301  NA 138 141 141 143  K 4.0 4.1 4.0 4.0  CL 104 105 106 106  CO2 26 24 23 25   GLUCOSE 88 103* 76 66*  BUN 26* 19 17 17   CREATININE 1.13* 1.07* 1.10* 1.12*  CALCIUM 9.8 10.0 10.2 10.1  MG  --   --  2.2  --   PHOS  --   --  3.5  --    GFR: Estimated Creatinine Clearance: 38.1 mL/min (A) (by C-G formula based on SCr of 1.12 mg/dL (H)). Liver Function Tests: Recent Labs  Lab 08/18/20 1746 08/19/20 1444 08/20/20 0301  AST 33 30 27  ALT 30 27 23   ALKPHOS 191* 175* 153*  BILITOT 1.5* 2.0* 2.0*  PROT 6.5 6.3* 5.8*  ALBUMIN 3.2* 3.2* 2.8*   Recent Labs  Lab 08/18/20 1746  LIPASE 91*   Recent Labs  Lab 08/19/20 1434  AMMONIA 25   Coagulation Profile: Recent Labs  Lab 08/19/20 1444  INR 1.2   Cardiac Enzymes: No results for input(s): CKTOTAL, CKMB, CKMBINDEX, TROPONINI in the last 168  hours. BNP (last 3 results) No results for input(s): PROBNP in the last 8760 hours. HbA1C: No results for input(s): HGBA1C in the last 72 hours. CBG: No results for input(s): GLUCAP in the last 168 hours. Lipid Profile: No results for input(s): CHOL, HDL, LDLCALC, TRIG, CHOLHDL, LDLDIRECT in the last 72 hours. Thyroid Function Tests: Recent Labs    08/19/20 1444  TSH 4.884*   Anemia Panel: No results for input(s): VITAMINB12, FOLATE, FERRITIN, TIBC, IRON, RETICCTPCT in the last 72 hours. Sepsis Labs: No results for input(s): PROCALCITON, LATICACIDVEN in the last 168 hours.  Recent Results (from the past 240 hour(s))  SARS Coronavirus 2 by RT PCR (hospital order, performed in [facility] hospital lab) Nasopharyngeal Nasopharyngeal Swab     Status: None   Collection Time: 08/19/20 12:54 PM   Specimen: Nasopharyngeal Swab  Result Value Ref Range Status   SARS Coronavirus 2 NEGATIVE NEGATIVE Final    Comment: (NOTE) SARS-CoV-2 target nucleic acids are NOT DETECTED.  The SARS-CoV-2 RNA is generally detectable in upper and lower respiratory specimens during the acute phase of infection. The lowest concentration of SARS-CoV-2 viral copies this assay can detect is 250 copies / mL. A negative result does not preclude SARS-CoV-2 infection and should not be used as the sole basis for treatment or other patient management decisions.  A negative result may occur with improper specimen collection / handling, submission of specimen other than nasopharyngeal swab, presence of viral mutation(s) within the areas targeted by this assay, and inadequate number of viral copies (<250 copies / mL). A negative result must be combined with clinical observations, patient history, and epidemiological information.  Fact Sheet for Patients:   StrictlyIdeas.no  Fact Sheet for Healthcare Providers: BankingDealers.co.za  This test is not yet approved or   cleared by the [place] FDA and has been authorized for detection and/or diagnosis of SARS-CoV-2 by FDA under an Emergency Use Authorization (EUA).  This EUA will remain in effect (meaning this test can be used) for the duration of the COVID-19 declaration under Section 564(b)(1) of the Act, 21 U.S.C. section 360bbb-3(b)(1), unless the authorization is terminated or revoked sooner.  Performed at [facility], [place] [address], [place], [place] [address]  Radiology Studies: CT Head Wo Contrast  Result Date: 08/19/2020 CLINICAL DATA:  86 year old female with seizure. History of prior intracranial hemorrhage. EXAM: CT HEAD WITHOUT CONTRAST TECHNIQUE: Contiguous axial images were obtained from the base of the skull through the vertex without intravenous contrast. COMPARISON:  08/14/2020 CT and prior studies FINDINGS: Brain: Resolution of LEFT parietal and temporal intracranial hemorrhage noted. High LEFT frontoparietal hypodensity is again noted probably related to edema from recent intracranial hemorrhage. No new or persistent hemorrhage is identified intracranially. No acute infarct, midline shift or hydrocephalus. Mild atrophy and chronic small-vessel white matter ischemic changes again noted. Vascular: Carotid and vertebral atherosclerotic calcifications are noted. Skull: Normal. Negative for fracture or focal lesion. Sinuses/Orbits: No acute finding. Other: None. IMPRESSION: 1. Resolution of LEFT parietal and LEFT temporal hemorrhage. No evidence of new or acute intracranial abnormality. 2. High LEFT frontoparietal hypodensity likely related to edema from recent intracranial hemorrhage. 3. Mild atrophy and chronic small-vessel white matter ischemic changes. Electronically Signed   By: Margarette Canada M.D.   On: 08/19/2020 12:26   DG CHEST PORT 1 VIEW  Result Date: 08/19/2020 CLINICAL DATA:  Pneumonia EXAM: PORTABLE CHEST 1 VIEW COMPARISON:  Prior chest x-ray 09/17/2018 FINDINGS:  Marked interval enlargement of the cardiopericardial silhouette which is ill-defined and partially obscured by opacities in left greater than right lung base. The mediastinal contours appear similar. Atherosclerotic calcifications are present in the transverse aorta. Increased pulmonary vascular congestion with diffuse mild interstitial prominence suggesting interstitial edema. Bibasilar airspace opacities are present obscuring the hemidiaphragms. Findings are favored to reflect a combination of pleural fluid and atelectasis. No pneumothorax. No acute osseous abnormality. IMPRESSION: 1. Interval enlargement of the cardiopericardial silhouette which may represent worsening cardiomegaly versus pericardial effusion. 2. Pulmonary vascular congestion and mild edema concerning for CHF. 3. Symmetric bibasilar airspace opacities obscuring the diaphragms are favored to reflect a combination of layering pleural effusions with atelectasis. Recommend dedicated PA and lateral chest x-ray to confirm. Electronically Signed   By: Jacqulynn Cadet M.D.   On: 08/19/2020 14:21    Scheduled Meds: Continuous Infusions: . levETIRAcetam 500 mg (08/20/20 1015)     LOS: 1 day   Time spent: 35 minutes   Darliss Cheney, MD [facility]  08/20/2020, 10:35 AM   To contact the attending provider between 7A-7P or the covering provider during after hours 7P-7A, please log into the web site [email or web address].

## 2020-08-21 ENCOUNTER — Inpatient Hospital Stay (HOSPITAL_COMMUNITY): Payer: Medicare Other

## 2020-08-21 LAB — PHENYTOIN LEVEL, TOTAL: Phenytoin Lvl: 12 ug/mL (ref 10.0–20.0)

## 2020-08-21 LAB — BASIC METABOLIC PANEL
Anion gap: 10 (ref 5–15)
BUN: 17 mg/dL (ref 8–23)
CO2: 27 mmol/L (ref 22–32)
Calcium: 10 mg/dL (ref 8.9–10.3)
Chloride: 104 mmol/L (ref 98–111)
Creatinine, Ser: 1.13 mg/dL — ABNORMAL HIGH (ref 0.44–1.00)
GFR calc Af Amer: 50 mL/min — ABNORMAL LOW (ref >60)
GFR calc non Af Amer: 43 mL/min — ABNORMAL LOW (ref >60)
Glucose, Bld: 84 mg/dL (ref 70–99)
Potassium: 3.5 mmol/L (ref 3.5–5.1)
Sodium: 141 mmol/L (ref 135–145)

## 2020-08-21 LAB — GLUCOSE, CAPILLARY: Glucose-Capillary: 100 mg/dL — ABNORMAL HIGH (ref 70–99)

## 2020-08-21 LAB — MAGNESIUM: Magnesium: 2 mg/dL (ref 1.7–2.4)

## 2020-08-21 MED ORDER — PHENYTOIN 50 MG PO CHEW
100.0000 mg | CHEWABLE_TABLET | Freq: Three times a day (TID) | ORAL | Status: DC
  Administered 2020-08-21 – 2020-08-22 (×2): 100 mg via ORAL
  Filled 2020-08-21 (×5): qty 2

## 2020-08-21 MED ORDER — LEVETIRACETAM IN NACL 1000 MG/100ML IV SOLN
1000.0000 mg | Freq: Two times a day (BID) | INTRAVENOUS | Status: DC
  Administered 2020-08-21 – 2020-08-25 (×8): 1000 mg via INTRAVENOUS
  Filled 2020-08-21 (×8): qty 100

## 2020-08-21 MED ORDER — SODIUM CHLORIDE 0.9 % IV SOLN
750.0000 mg | Freq: Two times a day (BID) | INTRAVENOUS | Status: DC
  Administered 2020-08-21: 750 mg via INTRAVENOUS
  Filled 2020-08-21 (×2): qty 7.5

## 2020-08-21 MED ORDER — SODIUM CHLORIDE 0.9 % IV SOLN
1000.0000 mg | INTRAVENOUS | Status: AC
  Administered 2020-08-21: 1000 mg via INTRAVENOUS
  Filled 2020-08-21: qty 20

## 2020-08-21 NOTE — Progress Notes (Addendum)
Subjective: Patient had another episode of right arm and head jerking around 5:30 PM yesterday during which she was alert and able to speak clearly per RN.  Patient daughter at bedside states patient was also having right arm jerking around 8 AM this morning when daughter arrived.   ROS: Unable to obtain due to poor mental status  Examination  Vital signs in last 24 hours: Temp:  [97.7 F (36.5 C)-98.7 F (37.1 C)] 97.7 F (36.5 C) (08/24 1200) Pulse Rate:  [75-109] 88 (08/24 1200) Resp:  [16-20] 18 (08/24 1200) BP: (96-130)/(51-92) 96/77 (08/24 1200) SpO2:  [94 %-100 %] 95 % (08/24 1200) Weight:  [90.5 kg] 90.5 kg (08/24 0316)  General: lying in bed, not in apparent distress CVS: pulse-normal rate and rhythm RS: breathing comfortably, CTAB Extremities: normal, warm  Neuro: MS: Drowsy, wakes up to repeated verbal stimuli, able to follow simple commands like stick out her tongue and show me 2 fingers CN: pupils equal and reactive,  EOMI, face symmetric, tongue midline, normal sensation over face, Motor: Antigravity strength in all 4 extremities, L>R  Basic Metabolic Panel: Recent Labs  Lab 08/18/20 1746 08/18/20 1746 08/19/20 1444 08/20/20 0301 08/21/20 0749  NA 141  --  141 143 141  K 4.1  --  4.0 4.0 3.5  CL 105  --  106 106 104  CO2 24  --  23 25 27   GLUCOSE 103*  --  76 66* 84  BUN 19  --  17 17 17   CREATININE 1.07*  --  1.10* 1.12* 1.13*  CALCIUM 10.0   < > 10.2 10.1 10.0  MG  --   --  2.2  --  2.0  PHOS  --   --  3.5  --   --    < > = values in this interval not displayed.    CBC: Recent Labs  Lab 08/15/20 0620 08/18/20 1746 08/19/20 1444 08/20/20 0301  WBC 3.5* 4.1 4.8 3.3*  NEUTROABS 1.9  --  3.0  --   HGB 11.6* 12.7 13.6 12.1  HCT 36.3 41.3 44.5 38.8  MCV 96.3 97.4 99.6 97.2  PLT 148* 189 157 160     Coagulation Studies: Recent Labs    08/19/20 1444  LABPROT 14.5  INR 1.2    Imaging No new brain imaging overnight  ASSESSMENT AND  PLAN: 86 year old female with recent left temporal hemorrhage (07/22/2020) and associated seizures who presented with breakthrough seizures.  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  Chest x-ray concerning for CHF as well as pleural effusions. -Routine EEG showed periodic lateralized epileptiform discharges in left hemisphere likely secondary to underlying chronic ICH.  No definite seizures were seen. -As patient continues to be awake and following commands, will slowly try to adjust AEDs without causing excessive sedation  Recommendations -Keppra was increased to 750 mg overnight however patient had further seizures this morning.  Therefore will increase Keppra to 1000 mg twice daily. -If patient has frequent seizures, will consider adding IV Dilantin.  If patient has brief seizures, can add oxcarbazepine 150 mg twice daily -Continue seizure precautions -Discussed the plan with patient's daughter at bedside and Dr. Doristine Bosworth -As needed IV Ativan 2 mg for clinical seizure-like activity.  Please be cautious when administering Ativan to minimize excessive sedation -Management of rest of comorbidities per primary team   ADDENDUM -Received call  from patient's nurse that patient was noted to have a right arm twitching again.  On evaluation patient was noted to have rhythmic right upper extremity twitching consistent with focal motor seizure.  IV Ativan 2 mg was administered and ordered IV Dilantin 1000 mg loading dose followed by 100 mg 3 times daily for maintenance.  Also ordered Dilantin level to be collected 2 hours after loading dose is complete. -Video EEG also ordered to evaluate for seizures  I have spent a total of 35  minuteswith the patient reviewing hospitalnotes,  test results, labs and examining the  patient as well as establishing an assessment and plan that was discussed personally with the patient and Dr Pahwani.>50% of time was spent in direct patient care.  Zeb Comfort Epilepsy [facility] For questions after 5pm please refer to AMION to reach the Neurologist on call

## 2020-08-21 NOTE — Progress Notes (Signed)
Dr. Rory Percy informed of pt receiving 2 mg Ativan prn for right side jerking.

## 2020-08-21 NOTE — Progress Notes (Signed)
OT Cancellation Note  Patient Details Name: Lori Bridges MRN: [id number] DOB: Jun 23, 1930   Cancelled Treatment:    Reason Eval/Treat Not Completed: Patient not medically ready;Fatigue/lethargy limiting ability to participate- upon entering room, patient daughter reports R UE twitching had just started and she was about to call the nurse.  Observed twitching in R upper arm and forearm; patient soundly asleep.  RN notified and going to assess patient.  Will follow and see as medically appropriate and able.   [name], OT Acute Rehabilitation Services Pager [phone] Office [phone]    Lori Bridges 08/21/2020, 2:33 PM

## 2020-08-21 NOTE — Plan of Care (Signed)

## 2020-08-21 NOTE — Plan of Care (Signed)
Overnight cross coverage note  Reported by RN that patient having right sided jerking that lasted about 7 minutes.  Received 2 mg of Ativan IV.  Per Dr. Karolee Stamps note, if seizures persisted, recommended increasing Keppra.  Will increase Keppra to 750 mg twice daily. I will give the first dose now.  Dr. Hortense Ramal will continue to follow in the morning.  -- Amie Portland, MD [facility]

## 2020-08-21 NOTE — Progress Notes (Signed)
Pt daughter states that pt right arm and hand twitching and that she noticed it @ around 1406. Symptoms continued when this writer walked into pt room. Nurse notified Hortense Ramal, MD of pt seizure like activity.

## 2020-08-21 NOTE — Progress Notes (Signed)
Lori Ramal, MD at pt bedside. Gave verbal order for 1 mg Ativan IV and then gave the order for an additional  mg of Ativan @ 1439. Pt symptoms resolved after Ativan administered. MD ordered loading dose of  Dilantin.

## 2020-08-21 NOTE — Progress Notes (Signed)
Patient having focal seizure involving RUE, maintaining awareness and with no other complaints. Onset was ~10 minutes ago. She was given IV Ativan, is sedated and RUE movements nearly resolved.

## 2020-08-21 NOTE — Progress Notes (Addendum)
Pt's right arm and head twitching/jerking at this time, pt AOx1. Pt alert and speaking clearly. Dr. Myna Hidalgo informed.

## 2020-08-21 NOTE — Progress Notes (Signed)
vLTM EEG started. Notified Neuro

## 2020-08-21 NOTE — Progress Notes (Signed)
PROGRESS NOTE    Lori Bridges  MKL:[id number] DOB: May 15, 1930 DOA: 08/18/2020 PCP: Derinda Late, MD   Brief Narrative:   Lori Bridges is a 86 y.o. female with medical history significant of paroxysmal A. fib, chronic diastolic CHF, hypertension, hyperlipidemia, GERD, recent ICH, degenerative disc disease, CKD stage IIIa presents to emergency department with right-sided seizure-like activity.  patient recently hospitalized due to [place] due to uncontrolled hypertension and right-sided  seizure-like activity on 7/29 and discharged home on 8/19 from rehab in stable condition on Keppra 250 mg twice daily.  EEG showed epileptogenicity in the left frontal lobe but no seizure activity.  Her Coumadin was discontinued. patient was doing fine after the discharge however on 08/18/2020 she started feeling dizzy and complaining of involuntary movements of right arm and leg and unable to bear weight on right leg.  Her symptoms started getting worse therefore family brought patient to the emergency department for further evaluation and management. Has chronic bilateral lower leg swelling however no history of orthopnea.  Upon arrival to ED: Patient tachycardic, afebrile, initial labs unremarkable.  CT head negative for acute findings.  EDP consulted neurology.  She received Keppra 1000 IV once and Ativan 1 mg and started on 500 mg IV. twice daily.  Admitted under hospitalist service.  Assessment & Plan:   Principal Problem:   Seizures ([place]) Active Problems:   Hyperlipidemia   Chronic diastolic CHF (congestive heart failure) (HCC)   ICH (intracerebral hemorrhage) (HCC) - 2 small L brain ICH, likely d/t HTN on subtherapeutic warfarin   CKD (chronic kidney disease), stage IIIa   Hypothyroidism   PAF (paroxysmal atrial fibrillation) (HCC)   Gastroesophageal reflux disease   Seizure disorder (HCC)   Seizure disorder/partial status epilepticus: -History of recent ICH due to uncontrolled hypertension &  post stroke seizure disorder.  Recently discharged from rehab on 8/19 on Keppra 250 mg twice daily -CT head negative for acute findings.  Patient received Keppra 1000 mg IV once and Ativan 1 mg in ED a.m. started on Keppra 500 mg twice daily by neurology.patient again had several episodes of right arm seizure activities overnight received Ativan and neurology was notified.  Her dose of Keppra has been increased to 750 mg IV twice daily.  She still had some amount of tremors in right upper extremities this morning when I saw her.  Her daughter was at the bedside.  Patient remained alert and oriented.  Paroxysmal A. Fib: Currently in sinus rhythm.  Continue metoprolol.  Coumadin was discontinued on previous hospitalization due to [place].  On telemetry.    History of ICH: -Reviewed CT head-shows improvement. -Off of Coumadin  Hypothyroidism: Continue Synthroid.  CKD stage IIIa: At baseline. -Continue to monitor  GERD: Hold PPI  Chronic diastolic CHF: Patient appears euvolemic.  Has chronic bilateral lower extremity edema.  On room air.  No dyspnea or tachypnea.  She received 1 dose of IV Lasix yesterday and day before yesterday.  Continue home dose of oral Lasix now. -Reviewed echo from 06/04/2020 which showed ejection fraction of 60 to 65%, severe TR.  DVT prophylaxis: SCDs Start: 08/19/20 1317   Code Status: Full Code  Family Communication: Daughter present at bedside.  Plan of care discussed with patient in length and she verbalized understanding and agreed with it.  Status is: Inpatient  Remains inpatient appropriate because:Inpatient level of care appropriate due to severity of illness   Dispo: The patient is from: SNF  Anticipated d/c is to: SNF              Anticipated d/c date is: 1 day              Patient currently is not medically stable to d/c.        Estimated body mass index is 31.25 kg/m as calculated from the following:   Height as of this encounter:  5\' 7"  (1.702 m).   Weight as of this encounter: 90.5 kg.      Nutritional status:               Consultants:   Neurology  Procedures:   None  Antimicrobials:  Anti-infectives (From admission, onward)   None         Subjective: Seen and examined this morning.  Daughter at the bedside.  Patient was alert but was still having some right upper extremity tremors in my presence.  She had more episodes last night where she required Ativan.  Objective: Vitals:   08/20/20 2209 08/20/20 2315 08/21/20 0316 08/21/20 0756  BP:  116/74 118/77 113/80  Pulse: (!) 108 92 88 88  Resp:  18 18 18   Temp:  98 F (36.7 C) 98.3 F (36.8 C) 98.2 F (36.8 C)  TempSrc:  Oral Oral Oral  SpO2:  98% 99% 94%  Weight:   90.5 kg   Height:        Intake/Output Summary (Last 24 hours) at 08/21/2020 1104 Last data filed at 08/21/2020 0900 Gross per 24 hour  Intake 340 ml  Output 1600 ml  Net -1260 ml   Filed Weights   08/19/20 1313 08/21/20 0316  Weight: 81.6 kg 90.5 kg    Examination:  General exam: Appears calm and comfortable  Respiratory system: Clear to auscultation. Respiratory effort normal. Cardiovascular system: S1 & S2 heard, RRR. No JVD, murmurs, rubs, gallops or clicks.  +3 pitting edema bilateral lower extremity Gastrointestinal system: Abdomen is nondistended, soft and nontender. No organomegaly or masses felt. Normal bowel sounds heard. Central nervous system: Alert and oriented. No focal neurological deficits. Extremities: Symmetric 5 x 5 power.  Tremors in right upper extremity Skin: No rashes, lesions or ulcers.  Psychiatry: Judgement and insight appear normal. Mood & affect appropriate.    Data Reviewed: I have personally reviewed following labs and imaging studies  CBC: Recent Labs  Lab 08/15/20 0620 08/18/20 1746 08/19/20 1444 08/20/20 0301  WBC 3.5* 4.1 4.8 3.3*  NEUTROABS 1.9  --  3.0  --   HGB 11.6* 12.7 13.6 12.1  HCT 36.3 41.3 44.5 38.8    MCV 96.3 97.4 99.6 97.2  PLT 148* 189 157 219   Basic Metabolic Panel: Recent Labs  Lab 08/18/20 1746 08/19/20 1444 08/20/20 0301 08/21/20 0749  NA 141 141 143 141  K 4.1 4.0 4.0 3.5  CL 105 106 106 104  CO2 24 23 25 27   GLUCOSE 103* 76 66* 84  BUN 19 17 17 17   CREATININE 1.07* 1.10* 1.12* 1.13*  CALCIUM 10.0 10.2 10.1 10.0  MG  --  2.2  --  2.0  PHOS  --  3.5  --   --    GFR: Estimated Creatinine Clearance: 39.8 mL/min (A) (by C-G formula based on SCr of 1.13 mg/dL (H)). Liver Function Tests: Recent Labs  Lab 08/18/20 1746 08/19/20 1444 08/20/20 0301  AST 33 30 27  ALT 30 27 23   ALKPHOS 191* 175* 153*  BILITOT 1.5* 2.0* 2.0*  PROT  6.5 6.3* 5.8*  ALBUMIN 3.2* 3.2* 2.8*   Recent Labs  Lab 08/18/20 1746  LIPASE 91*   Recent Labs  Lab 08/19/20 1434  AMMONIA 25   Coagulation Profile: Recent Labs  Lab 08/19/20 1444  INR 1.2   Cardiac Enzymes: No results for input(s): CKTOTAL, CKMB, CKMBINDEX, TROPONINI in the last 168 hours. BNP (last 3 results) No results for input(s): PROBNP in the last 8760 hours. HbA1C: No results for input(s): HGBA1C in the last 72 hours. CBG: No results for input(s): GLUCAP in the last 168 hours. Lipid Profile: No results for input(s): CHOL, HDL, LDLCALC, TRIG, CHOLHDL, LDLDIRECT in the last 72 hours. Thyroid Function Tests: Recent Labs    08/19/20 1444  TSH 4.884*   Anemia Panel: No results for input(s): VITAMINB12, FOLATE, FERRITIN, TIBC, IRON, RETICCTPCT in the last 72 hours. Sepsis Labs: No results for input(s): PROCALCITON, LATICACIDVEN in the last 168 hours.  Recent Results (from the past 240 hour(s))  SARS Coronavirus 2 by RT PCR (hospital order, performed in [facility] hospital lab) Nasopharyngeal Nasopharyngeal Swab     Status: None   Collection Time: 08/19/20 12:54 PM   Specimen: Nasopharyngeal Swab  Result Value Ref Range Status   SARS Coronavirus 2 NEGATIVE NEGATIVE Final    Comment: (NOTE) SARS-CoV-2  target nucleic acids are NOT DETECTED.  The SARS-CoV-2 RNA is generally detectable in upper and lower respiratory specimens during the acute phase of infection. The lowest concentration of SARS-CoV-2 viral copies this assay can detect is 250 copies / mL. A negative result does not preclude SARS-CoV-2 infection and should not be used as the sole basis for treatment or other patient management decisions.  A negative result may occur with improper specimen collection / handling, submission of specimen other than nasopharyngeal swab, presence of viral mutation(s) within the areas targeted by this assay, and inadequate number of viral copies (<250 copies / mL). A negative result must be combined with clinical observations, patient history, and epidemiological information.  Fact Sheet for Patients:   StrictlyIdeas.no  Fact Sheet for Healthcare Providers: BankingDealers.co.za  This test is not yet approved or  cleared by the [place] FDA and has been authorized for detection and/or diagnosis of SARS-CoV-2 by FDA under an Emergency Use Authorization (EUA).  This EUA will remain in effect (meaning this test can be used) for the duration of the COVID-19 declaration under Section 564(b)(1) of the Act, 21 U.S.C. section 360bbb-3(b)(1), unless the authorization is terminated or revoked sooner.  Performed at [facility], [place] [address] [place], [place] [address]       Radiology Studies: EEG  Result Date: 08/20/2020 Lora Havens, MD     08/20/2020 12:19 PM Patient Name: ELAIJAH MUNOZ MRN: [id number] Epilepsy Attending: Lora Havens Referring Physician/Provider: Dr. Zeb Comfort Date: 08/20/1020 Duration: 23.30 minutes Patient history: 86 year old female with history of seizures presented with breakthrough seizures and status epilepticus.  EEG to assess for seizures. Level of alertness: Awake AEDs during EEG study: Keppra  Technical aspects: This EEG study was done with scalp electrodes positioned according to the 10-20 International system of electrode placement. Electrical activity was acquired at a sampling rate of 500Hz  and reviewed with a high frequency filter of 70Hz  and a low frequency filter of 1Hz . EEG data were recorded continuously and digitally stored. Description: No definite posterior dominant rhythm was seen.  EEG showed continuous generalized and maximal left parieto-occipital region 3 to 5 Hz theta-delta slowing.  Periodic lateralized  epileptiform discharges at 1 Hz, at times rhythmic were also noted in left hemisphere, maximal left parieto-occipital region.  Hyperventilation and photic stimulation were not performed.   ABNORMALITY -Periodic lateralized epileptiform discharges, left hemisphere -Continuous slow, generalized and maximal left parieto-occipital region IMPRESSION: This study evidence of epileptogenicity as well as cortical dysfunction in left hemisphere, maximal left parieto-occipital region secondary to underlying structural abnormality. No seizures were seen throughout the recording. Lora Havens   CT Head Wo Contrast  Result Date: 08/19/2020 CLINICAL DATA:  86 year old female with seizure. History of prior intracranial hemorrhage. EXAM: CT HEAD WITHOUT CONTRAST TECHNIQUE: Contiguous axial images were obtained from the base of the skull through the vertex without intravenous contrast. COMPARISON:  08/14/2020 CT and prior studies FINDINGS: Brain: Resolution of LEFT parietal and temporal intracranial hemorrhage noted. High LEFT frontoparietal hypodensity is again noted probably related to edema from recent intracranial hemorrhage. No new or persistent hemorrhage is identified intracranially. No acute infarct, midline shift or hydrocephalus. Mild atrophy and chronic small-vessel white matter ischemic changes again noted. Vascular: Carotid and vertebral atherosclerotic calcifications are noted.  Skull: Normal. Negative for fracture or focal lesion. Sinuses/Orbits: No acute finding. Other: None. IMPRESSION: 1. Resolution of LEFT parietal and LEFT temporal hemorrhage. No evidence of new or acute intracranial abnormality. 2. High LEFT frontoparietal hypodensity likely related to edema from recent intracranial hemorrhage. 3. Mild atrophy and chronic small-vessel white matter ischemic changes. Electronically Signed   By: Margarette Canada M.D.   On: 08/19/2020 12:26   DG CHEST PORT 1 VIEW  Result Date: 08/19/2020 CLINICAL DATA:  Pneumonia EXAM: PORTABLE CHEST 1 VIEW COMPARISON:  Prior chest x-ray 09/17/2018 FINDINGS: Marked interval enlargement of the cardiopericardial silhouette which is ill-defined and partially obscured by opacities in left greater than right lung base. The mediastinal contours appear similar. Atherosclerotic calcifications are present in the transverse aorta. Increased pulmonary vascular congestion with diffuse mild interstitial prominence suggesting interstitial edema. Bibasilar airspace opacities are present obscuring the hemidiaphragms. Findings are favored to reflect a combination of pleural fluid and atelectasis. No pneumothorax. No acute osseous abnormality. IMPRESSION: 1. Interval enlargement of the cardiopericardial silhouette which may represent worsening cardiomegaly versus pericardial effusion. 2. Pulmonary vascular congestion and mild edema concerning for CHF. 3. Symmetric bibasilar airspace opacities obscuring the diaphragms are favored to reflect a combination of layering pleural effusions with atelectasis. Recommend dedicated PA and lateral chest x-ray to confirm. Electronically Signed   By: Jacqulynn Cadet M.D.   On: 08/19/2020 14:21    Scheduled Meds: . furosemide  20 mg Oral BID  . levothyroxine  50 mcg Oral QAC breakfast  . metoprolol tartrate  75 mg Oral BID   Continuous Infusions: . levETIRAcetam 750 mg (08/21/20 0648)     LOS: 2 days   Time spent: 30  minutes   Darliss Cheney, MD [facility]  08/21/2020, 11:04 AM   To contact the attending provider between 7A-7P or the covering provider during after hours 7P-7A, please log into the web site [email or web address].

## 2020-08-21 NOTE — Progress Notes (Signed)
Loading dose of Dilantin administered and pt appears sleepy. 2L O2 administered via nasal cannula and pt  O2 sats @ 96.

## 2020-08-21 NOTE — Progress Notes (Signed)
PT Cancellation Note  Patient Details Name: Lori Bridges MRN: [id number] DOB: October 10, 1930   Cancelled Treatment:    Reason Eval/Treat Not Completed: Patient not medically ready;Fatigue/lethargy limiting ability to participate  On entering room pt's daughter present and stated her mother's arm had just started twitching again and she was about to call the nurse. Observed twitching in upper arm and forearm and none in her right leg. Spoke to RN and she was going to assess pt.   Of note, pt was soundly asleep (had been given Ativan earlier for seizure-like activity).    Arby Barrette, PT Pager [phone]   Rexanne Mano 08/21/2020, 2:19 PM

## 2020-08-22 ENCOUNTER — Inpatient Hospital Stay (HOSPITAL_COMMUNITY): Payer: Medicare Other

## 2020-08-22 LAB — BASIC METABOLIC PANEL
Anion gap: 10 (ref 5–15)
BUN: 18 mg/dL (ref 8–23)
CO2: 24 mmol/L (ref 22–32)
Calcium: 9.6 mg/dL (ref 8.9–10.3)
Chloride: 105 mmol/L (ref 98–111)
Creatinine, Ser: 1 mg/dL (ref 0.44–1.00)
GFR calc Af Amer: 58 mL/min — ABNORMAL LOW (ref >60)
GFR calc non Af Amer: 50 mL/min — ABNORMAL LOW (ref >60)
Glucose, Bld: 104 mg/dL — ABNORMAL HIGH (ref 70–99)
Potassium: 4.1 mmol/L (ref 3.5–5.1)
Sodium: 139 mmol/L (ref 135–145)

## 2020-08-22 LAB — GLUCOSE, CAPILLARY: Glucose-Capillary: 126 mg/dL — ABNORMAL HIGH (ref 70–99)

## 2020-08-22 MED ORDER — SODIUM CHLORIDE 0.9 % IV SOLN
200.0000 mg | Freq: Once | INTRAVENOUS | Status: AC
  Administered 2020-08-22: 200 mg via INTRAVENOUS
  Filled 2020-08-22: qty 20

## 2020-08-22 MED ORDER — SODIUM CHLORIDE 0.9 % IV SOLN
1000.0000 mg | Freq: Once | INTRAVENOUS | Status: AC
  Administered 2020-08-22: 1000 mg via INTRAVENOUS
  Filled 2020-08-22: qty 7.69

## 2020-08-22 MED ORDER — PHENOBARBITAL SODIUM 65 MG/ML IJ SOLN: 1000.0000 mg | Freq: Once | INTRAMUSCULAR | Status: DC

## 2020-08-22 MED ORDER — PHENYTOIN 50 MG PO CHEW
125.0000 mg | CHEWABLE_TABLET | Freq: Three times a day (TID) | ORAL | Status: DC
  Filled 2020-08-22 (×2): qty 2.5

## 2020-08-22 MED ORDER — LORAZEPAM 2 MG/ML IJ SOLN
1.0000 mg | Freq: Once | INTRAMUSCULAR | Status: AC
  Administered 2020-08-22: 1 mg via INTRAVENOUS
  Filled 2020-08-22: qty 1

## 2020-08-22 MED ORDER — LORAZEPAM 2 MG/ML IJ SOLN: 1.0000 mg | Freq: Once | INTRAMUSCULAR | Status: DC

## 2020-08-22 MED ORDER — PHENYTOIN 50 MG PO CHEW
125.0000 mg | CHEWABLE_TABLET | Freq: Three times a day (TID) | ORAL | Status: DC
  Filled 2020-08-22: qty 2.5

## 2020-08-22 MED ORDER — FUROSEMIDE 10 MG/ML IJ SOLN
20.0000 mg | Freq: Once | INTRAMUSCULAR | Status: AC
  Administered 2020-08-22: 20 mg via INTRAVENOUS
  Filled 2020-08-22: qty 4

## 2020-08-22 MED ORDER — PERAMPANEL 8 MG PO TABS
8.0000 mg | ORAL_TABLET | Freq: Once | ORAL | Status: AC
  Administered 2020-08-22: 8 mg via ORAL
  Filled 2020-08-22: qty 1

## 2020-08-22 MED ORDER — FUROSEMIDE 20 MG PO TABS
20.0000 mg | ORAL_TABLET | Freq: Two times a day (BID) | ORAL | Status: DC
  Filled 2020-08-22 (×2): qty 1

## 2020-08-22 MED ORDER — SODIUM CHLORIDE 0.9 % IV SOLN
125.0000 mg | Freq: Once | INTRAVENOUS | Status: AC
  Administered 2020-08-22: 125 mg via INTRAVENOUS
  Filled 2020-08-22: qty 2.5

## 2020-08-22 NOTE — Progress Notes (Signed)
RN tried to reach hospitalist and neuro MD concerning change in status still awaiting neurology consult.

## 2020-08-22 NOTE — Progress Notes (Signed)
PROGRESS NOTE    Lori Bridges  EHM:[id number] DOB: 11-11-1930 DOA: 08/18/2020 PCP: Derinda Late, MD   Brief Narrative:   Lori Bridges is a 86 y.o. female with medical history significant of paroxysmal A. fib, chronic diastolic CHF, hypertension, hyperlipidemia, GERD, recent ICH, degenerative disc disease, CKD stage IIIa presents to emergency department with right-sided seizure-like activity.  Lori Bridges recently hospitalized due to [place] due to uncontrolled hypertension and right-sided  seizure-like activity on 7/29 and discharged home on 8/19 from rehab in stable condition on Keppra 250 mg twice daily.  EEG showed epileptogenicity in the left frontal lobe but no seizure activity.  Her Coumadin was discontinued. Lori Bridges was doing fine after the discharge however on 08/18/2020 she started feeling dizzy and complaining of involuntary movements of right arm and leg and unable to bear weight on right leg.  Her symptoms started getting worse therefore family brought Lori Bridges to the emergency department for further evaluation and management. Has chronic bilateral lower leg swelling however no history of orthopnea.  Upon arrival to ED: Lori Bridges tachycardic, afebrile, initial labs unremarkable.  CT head negative for acute findings.  EDP consulted neurology.  She received Keppra 1000 IV once and Ativan 1 mg and started on 500 mg IV. twice daily.  Admitted under hospitalist service.  Assessment & Plan:   Principal Problem:   Seizures ([place]) Active Problems:   Hyperlipidemia   Chronic diastolic CHF (congestive heart failure) (HCC)   ICH (intracerebral hemorrhage) (HCC) - 2 small L brain ICH, likely d/t HTN on subtherapeutic warfarin   CKD (chronic kidney disease), stage IIIa   Hypothyroidism   PAF (paroxysmal atrial fibrillation) (HCC)   Gastroesophageal reflux disease   Seizure disorder (HCC)   Seizure disorder/partial status epilepticus: -History of recent ICH due to uncontrolled hypertension &  post stroke seizure disorder.  Recently discharged from rehab on 8/19 on Keppra 250 mg twice daily -CT head negative for acute findings.  Lori Bridges received Keppra 1000 mg IV once and Ativan 1 mg in ED a.m. started on Keppra 500 mg twice daily by neurology.Lori Bridges again had several episodes of right arm seizure activities overnight received Ativan and neurology was notified.  Her dose of Keppra was increased to 750 mg IV twice daily on 08/21/2020 and despite of that, she continued to have more episodes of seizure so she was loaded with Dilantin 1 g IV and started on Dilantin 100 mg p.o. 3 times daily.  Lori Bridges continued to have more right upper extremity twitching in last 24 hours.  She was seen by neurology again today.  Her Dilantin has been increased to 125 mg 3 times daily and she has received a loading dose of perampanel.  Paroxysmal A. Fib: Currently in sinus rhythm.  Continue metoprolol.  Coumadin was discontinued on previous hospitalization due to [place].  On telemetry.    History of ICH: -Reviewed CT head-shows improvement. -Off of Coumadin  Hypothyroidism: Continue Synthroid.  CKD stage IIIa: At baseline. -Continue to monitor  GERD: Hold PPI  Chronic diastolic CHF: Lori Bridges appears euvolemic.  Has chronic bilateral lower extremity edema.  On room air.  No dyspnea or tachypnea.  She received IV Lasix for first 2 days of hospitalization. -Reviewed echo from 06/04/2020 which showed ejection fraction of 60 to 65%, severe TR. continue home dose of Lasix 20 mg p.o. twice daily.  DVT prophylaxis: SCDs Start: 08/19/20 1317   Code Status: Full Code  Family Communication: Lori Bridges.  Plan of care discussed with  Lori Bridges in length and she verbalized understanding and agreed with it.  Status is: Inpatient  Remains inpatient appropriate because:Inpatient level of care appropriate due to severity of illness   Dispo: The Lori Bridges is from: SNF              Anticipated d/c is to:  SNF              Anticipated d/c date is: 1 day              Lori Bridges currently is not medically stable to d/c.        Estimated body mass index is 31.15 kg/m as calculated from the following:   Height as of this encounter: 5\' 7"  (1.702 m).   Weight as of this encounter: 90.2 kg.      Nutritional status:               Consultants:   Neurology  Procedures:   None  Antimicrobials:  Anti-infectives (From admission, onward)   None         Subjective: Seen and examined.  Lori at the [facility]ges.  She tells me that Lori Bridges had several episodes of right upper extremity tremors/twitching yesterday.  At this point in time, during my evaluation, Lori Bridges was slightly lethargic but oriented.  She did not have any twitching in front of me.  Objective: Vitals:   08/22/20 0316 08/22/20 0321 08/22/20 0829 08/22/20 1132  BP: (!) 119/95  108/79 107/75  Pulse: 98  96 99  Resp: 19  18 18   Temp: 98 F (36.7 C)   97.6 F (36.4 C)  TempSrc: Axillary   Oral  SpO2: 98%  100% 100%  Weight:  90.2 kg    Height:        Intake/Output Summary (Last 24 hours) at 08/22/2020 1400 Last data filed at 08/22/2020 1312 Gross per 24 hour  Intake 960 ml  Output 490 ml  Net 470 ml   Filed Weights   08/19/20 1313 08/21/20 0316 08/22/20 0321  Weight: 81.6 kg 90.5 kg 90.2 kg    Examination:  General exam: Appears calm and comfortable  Respiratory system: Clear to auscultation. Respiratory effort normal. Cardiovascular system: S1 & S2 heard, RRR. No JVD, murmurs, rubs, gallops or clicks. No pedal edema. Gastrointestinal system: Abdomen is nondistended, soft and nontender. No organomegaly or masses felt. Normal bowel sounds heard. Central nervous system: Alert and oriented. No focal neurological deficits. Extremities: Symmetric 5 x 5 power. Skin: No rashes, lesions or ulcers.    Data Reviewed: I have personally reviewed following labs and imaging studies  CBC: Recent Labs    Lab 19-Aug-2020 1746 08/19/20 1444 08/20/20 0301  WBC 4.1 4.8 3.3*  NEUTROABS  --  3.0  --   HGB 12.7 13.6 12.1  HCT 41.3 44.5 38.8  MCV 97.4 99.6 97.2  PLT 189 157 970   Basic Metabolic Panel: Recent Labs  Lab August 19, 2020 1746 08/19/20 1444 08/20/20 0301 08/21/20 0749  NA 141 141 143 141  K 4.1 4.0 4.0 3.5  CL 105 106 106 104  CO2 24 23 25 27   GLUCOSE 103* 76 66* 84  BUN 19 17 17 17   CREATININE 1.07* 1.10* 1.12* 1.13*  CALCIUM 10.0 10.2 10.1 10.0  MG  --  2.2  --  2.0  PHOS  --  3.5  --   --    GFR: Estimated Creatinine Clearance: 39.7 mL/min (A) (by C-G formula based on SCr of 1.13 mg/dL (H)). Liver  Function Tests: Recent Labs  Lab 08/18/20 1746 08/19/20 1444 08/20/20 0301  AST 33 30 27  ALT 30 27 23   ALKPHOS 191* 175* 153*  BILITOT 1.5* 2.0* 2.0*  PROT 6.5 6.3* 5.8*  ALBUMIN 3.2* 3.2* 2.8*   Recent Labs  Lab 08/18/20 1746  LIPASE 91*   Recent Labs  Lab 08/19/20 1434  AMMONIA 25   Coagulation Profile: Recent Labs  Lab 08/19/20 1444  INR 1.2   Cardiac Enzymes: No results for input(s): CKTOTAL, CKMB, CKMBINDEX, TROPONINI in the last 168 hours. BNP (last 3 results) No results for input(s): PROBNP in the last 8760 hours. HbA1C: No results for input(s): HGBA1C in the last 72 hours. CBG: Recent Labs  Lab 08/21/20 2020  GLUCAP 100*   Lipid Profile: No results for input(s): CHOL, HDL, LDLCALC, TRIG, CHOLHDL, LDLDIRECT in the last 72 hours. Thyroid Function Tests: Recent Labs    08/19/20 1444  TSH 4.884*   Anemia Panel: No results for input(s): VITAMINB12, FOLATE, FERRITIN, TIBC, IRON, RETICCTPCT in the last 72 hours. Sepsis Labs: No results for input(s): PROCALCITON, LATICACIDVEN in the last 168 hours.  Recent Results (from the past 240 hour(s))  SARS Coronavirus 2 by RT PCR (hospital order, performed in [facility] hospital lab) Nasopharyngeal Nasopharyngeal Swab     Status: None   Collection Time: 08/19/20 12:54 PM   Specimen:  Nasopharyngeal Swab  Result Value Ref Range Status   SARS Coronavirus 2 NEGATIVE NEGATIVE Final    Comment: (NOTE) SARS-CoV-2 target nucleic acids are NOT DETECTED.  The SARS-CoV-2 RNA is generally detectable in upper and lower respiratory specimens during the acute phase of infection. The lowest concentration of SARS-CoV-2 viral copies this assay can detect is 250 copies / mL. A negative result does not preclude SARS-CoV-2 infection and should not be used as the sole basis for treatment or other Lori Bridges management decisions.  A negative result may occur with improper specimen collection / handling, submission of specimen other than nasopharyngeal swab, presence of viral mutation(s) within the areas targeted by this assay, and inadequate number of viral copies (<250 copies / mL). A negative result must be combined with clinical observations, Lori Bridges history, and epidemiological information.  Fact Sheet for Patients:   StrictlyIdeas.no  Fact Sheet for Healthcare Providers: BankingDealers.co.za  This test is not yet approved or  cleared by the [place] FDA and has been authorized for detection and/or diagnosis of SARS-CoV-2 by FDA under an Emergency Use Authorization (EUA).  This EUA will remain in effect (meaning this test can be used) for the duration of the COVID-19 declaration under Section 564(b)(1) of the Act, 21 U.S.C. section 360bbb-3(b)(1), unless the authorization is terminated or revoked sooner.  Performed at [facility], [place] [address] [place], [place] [address]       Radiology Studies: Overnight EEG with video  Result Date: 08/22/2020 Lora Havens, MD     08/22/2020  1:47 PM Lori Bridges MRN: [id number] Epilepsy Attending: Lora Havens Referring Physician/Provider: Dr. Zeb Comfort Duration: 08/21/2020 1525 to 08/22/2020 1300  Lori Bridges history: 86 year old female with history of  seizures presented with breakthrough seizures and status epilepticus.  EEG to assess for seizures.  Level of alertness: Awake, asleep  AEDs during EEG study: Keppra  Technical aspects: This EEG study was done with scalp electrodes positioned according to the 10-20 International system of electrode placement. Electrical activity was acquired at a sampling rate of 500Hz  and reviewed with a high frequency  filter of 70Hz  and a low frequency filter of 1Hz . EEG data were recorded continuously and digitally stored.  Description: No definite posterior dominant rhythm was seen.  EEG showed continuous generalized and maximal left parieto-occipital region 3 to 5 Hz theta-delta slowing.  Periodic lateralized epileptiform discharges (PLEDs) with overriding fast activity at 1 Hz, at times rhythmic were also noted in left hemisphere, maximal left parieto-occipital region. Hyperventilation and photic stimulation were not performed.   Event button was pressed on 8/25/121 at 0511 for right upper extremity shaking. Concomitant EEG showed rhythmic PLEds in left hemisphere, maximal left parieto-occipital region consistent with focal motor seizure. ABNORMALITY -Focal motor seizure, left hemisphere -PLEds with overriding fast activity, left hemisphere -Continuous slow, generalized and maximal left parieto-occipital region  IMPRESSION: This study showed focal motor seizure arising from left hemisphere. PLEDs with overriding fast activity were also noted which is on the ictal-interictal continuum.  Additionally, there is evidence of cortical dysfunction in left hemisphere, maximal left parieto-occipital region secondary to underlying structural abnormality.  Priyanka [name]    Scheduled Meds:  furosemide  20 mg Oral BID   levothyroxine  50 mcg Oral QAC breakfast   metoprolol tartrate  75 mg Oral BID   phenytoin  125 mg Oral TID   Continuous Infusions:  levETIRAcetam 1,000 mg (08/22/20 0508)     LOS: 3 days   Time  spent: 28 minutes   Darliss Cheney, MD [facility]  08/22/2020, 2:00 PM   To contact the attending provider between 7A-7P or the covering provider during after hours 7P-7A, please log into the web site [email or web address].

## 2020-08-22 NOTE — Progress Notes (Addendum)
Subjective: Continues to have intermittent clinical right arm and leg twitching.   ROS:  Unable to obtain due to poor mental status  Examination  Vital signs in last 24 hours: Temp:  [97.3 F (36.3 C)-98.2 F (36.8 C)] 97.6 F (36.4 C) (08/25 1132) Pulse Rate:  [83-102] 99 (08/25 1132) Resp:  [16-19] 18 (08/25 1132) BP: (93-119)/(65-95) 107/75 (08/25 1132) SpO2:  [92 %-100 %] 100 % (08/25 1132) Weight:  [90.2 kg] 90.2 kg (08/25 0321)  General: lying in bed, not in apparent distress CVS: pulse-normal rate and rhythm RS: breathing comfortably, CTAB Extremities: normal, warm  Neuro: MS: Drowsy, wakes up to verbal stimuli, able to follow simple commands like stick out her tongue and show me 2 fingers CN: pupils equal and reactive,  EOMI, face symmetric, tongue midline, normal sensation over face, Motor: Antigravity strength in all 4 extremities, L>R  Basic Metabolic Panel: Recent Labs  Lab 08/18/20 1746 08/18/20 1746 08/19/20 1444 08/20/20 0301 08/21/20 0749  NA 141  --  141 143 141  K 4.1  --  4.0 4.0 3.5  CL 105  --  106 106 104  CO2 24  --  23 25 27   GLUCOSE 103*  --  76 66* 84  BUN 19  --  17 17 17   CREATININE 1.07*  --  1.10* 1.12* 1.13*  CALCIUM 10.0   < > 10.2 10.1 10.0  MG  --   --  2.2  --  2.0  PHOS  --   --  3.5  --   --    < > = values in this interval not displayed.    CBC: Recent Labs  Lab 08/18/20 1746 08/19/20 1444 08/20/20 0301  WBC 4.1 4.8 3.3*  NEUTROABS  --  3.0  --   HGB 12.7 13.6 12.1  HCT 41.3 44.5 38.8  MCV 97.4 99.6 97.2  PLT 189 157 160     Coagulation Studies: Recent Labs    08/19/20 1444  LABPROT 14.5  INR 1.2    Imaging No new brain imaging overnight  ASSESSMENT AND PLAN:86 year old female with recent left temporal hemorrhage (07/22/2020) and associated seizures who presented with breakthrough seizures.  Focal motor status epilepticus  Epilepsy with breakthrough seizures Chronic left temporal ICH (present on  admission) -Etiology of breakthrough seizures: No leukocytosis, normal electrolytes, no evidence of UTI. Chest x-ray concerning for CHF as well as pleural effusions. - Patient continues to have seizures    Recommendations -We will give 8 mg perampanel loading dose as patient continues to have seizures, increase Dilantin to 125 mg 3 times daily and continue Keppra 1000 mg twice daily -As patient continues to be awake and following commands, will slowly try to adjust AEDs without causing excessive sedation.  However did discuss with patient's daughter that if patient becomes excessively drowsy and is unable to protect her airways, she may require intubation.  Patient's daughter states she will talk to other daughters (patient has 6 daughters daughters) to confirm CODE STATUS. -If seizures persist, next up will be to add phenobarb -As needed IV Ativan 2 mg for clinical seizure-like activity.  Please be cautious when administering Ativan to minimize excessive sedation -Management of rest of comorbidities per primary team  ADDENDUM - Noted to be seizing again in evening. Will load with IV phenobarb. -  If seizures persist, will add vimpat. - After vimpat, might need to consider intubation and burst suppression.  I have spent a total of16minuteswith the patient reviewing hospitalnotes,  test results, labs and examining the patient as well as establishing an assessment and plan that was discussed personally with the patient, daughter a bedside and Dr Pahwani.>50% of time was spent in direct patient care.  Zeb Comfort Epilepsy [facility] For questions after 5pm please refer to AMION to reach the Neurologist on call

## 2020-08-22 NOTE — Progress Notes (Signed)
Right arm jerking for 1 min at 0511. Dr. Myna Hidalgo informed.

## 2020-08-22 NOTE — Progress Notes (Signed)
LTM maint complete - no skin breakdown under:  Fp1, F7, F3, M1

## 2020-08-22 NOTE — Progress Notes (Addendum)
S: RN has noted progressively worsening cognition today. BUE twitching also recently noted. Has had electrographic seizure activity originating from the left cerebral hemisphere based on LTM EEG findings. Additions to anticonvulsant regimen may be contributing to sedation/AMS.   O: BP 102/67 (BP Location: Right Arm)   Pulse 80   Temp 98.3 F (36.8 C) (Oral)   Resp 18   Ht 5\' 7"  (1.702 m)   Wt 90.2 kg   SpO2 96%   BMI 31.15 kg/m   Neurological exam (7:30 PM): Ment: Obtunded. Will not awaken to noxious stimuli but will move RUE semipurposefully and partially localizes source of sternal rub. Nonverbal. Not responding to commands.  CN: Pupils equally reactive. Does not gaze towards or away from visual stimuli. Eyes conjugately near the midline. Head rotated to the right with increased right sided tone. Drooling noted. No facial twitching appreciated.  Motor/Sensory: Moves RUE antigravity semipurposefully to sternal rub. LUE flaccid with no movement to any stimuli. Withdraws RLE > LLE to noxious plantar stimulation. No jerking or twitching noted.   A/R: -- Discussed above findings with Dr. Hortense Ramal -- STAT CT head ordered -- Dr. Hortense Ramal will perform follow up review of most recent EEG tracings.  -- If it is determined that her worsening mentation is more likely due to spread of electrographic seizure activity to her right hemisphere, or if left sided subclinical seizure activity is suspected to be ongoing, she may need to be intubated, transferred to the ICU and started on burst suppression protocol. -- I have discussed this case in sign-out with Dr. Rory Percy.   25 minutes of critical care time, including coordination of care and EEG review. The patient is at high risk of acute neurological deterioration.   Electronically signed: Dr. Kerney Elbe

## 2020-08-22 NOTE — Progress Notes (Signed)
PT Cancellation Note  Patient Details Name: Lori Bridges MRN: [id number] DOB: Jan 16, 1930   Cancelled Treatment:    Reason Eval/Treat Not Completed: Patient not medically ready.  Pt given meds earlier for seizure that have sedated pt.  She is unable to participate at this time.  Will try back tomorrow and see as pt's status allows. 08/22/2020  Lori Carne., PT Acute Rehabilitation Services [phone]  (pager) [phone]  (office)   Lori Bridges 08/22/2020, 2:26 PM

## 2020-08-22 NOTE — Progress Notes (Signed)
Called by RN regarding right upper extremity shaking.  Also had some left upper extremity rhythmic shaking lasting about 3 to 4 minutes. Evaluated patient at bedside. Extremely drowsy, does not open eyes to voice but to noxious stimulation localizes very strongly with the left and also moves right arm some.  Withdraws both lower extremity to noxious immolation.  No gaze deviation or preference. EEG continues to show PLEDs and rhythmic slowing on the left.  Recommendations: -Ativan 1 mg IV x1 -Load with Vimpat 200 mg x 1. -Based on clinical picture and EEG reading in the morning, will decide if Vimpat needs to be continued. -Continue all other antiepileptics-Keppra, phenytoin and phenobarbital.  Maintain seizure precautions  Neurology will continue to follow  -- Amie Portland, MD [facility] Pager: [phone] If 7pm to 7am, please call on call as listed on AMION.

## 2020-08-22 NOTE — Progress Notes (Signed)
Right side jerking for 3 mins. Started back along with left side jerking ongoing. Dr. Rory Percy at bedside. V.O. "1 mg Ativan".

## 2020-08-22 NOTE — Progress Notes (Signed)
Neuro MD notified of the change in status concerning with need to take a PO seizure medication. MD advised to hold. Med has been held.

## 2020-08-22 NOTE — Progress Notes (Signed)
OT Cancellation Note  Patient Details Name: Lori Bridges MRN: [id number] DOB: 12-07-1930   Cancelled Treatment:    Reason Eval/Treat Not Completed: Fatigue/lethargy limiting ability to participate/ patient not medically ready.  Patient given seizure meds that have sedated patient.  Unable to participate.  Will follow and see as able.   [name], OT Acute Rehabilitation Services Pager [phone] Office [phone]    [name] 08/22/2020, 2:58 PM

## 2020-08-22 NOTE — Progress Notes (Addendum)
Patient disoriented from previous assessment. Unable to follow commands. Did have a twitch at the right and left hands. Now resolved. Rapid response notified of change in status from previous assessment.

## 2020-08-22 NOTE — Plan of Care (Signed)

## 2020-08-22 NOTE — Procedures (Addendum)
Patient Name: Lori Bridges  MRN: [id number]  Epilepsy Attending: Lora Havens  Referring Physician/Provider: Dr. Zeb Comfort Duration: 08/21/2020 1525 to 08/22/2020 1525  Patient history: 86 year old female with history of seizures presented with breakthrough seizures and status epilepticus.  EEG to assess for seizures.  Level of alertness: Awake, asleep  AEDs during EEG study: Keppra  Technical aspects: This EEG study was done with scalp electrodes positioned according to the 10-20 International system of electrode placement. Electrical activity was acquired at a sampling rate of 500Hz  and reviewed with a high frequency filter of 70Hz  and a low frequency filter of 1Hz . EEG data were recorded continuously and digitally stored.   Description: No definite posterior dominant rhythm was seen.  EEG showed continuous generalized and maximal left parieto-occipital region 3 to 5 Hz theta-delta slowing.  Periodic lateralized epileptiform discharges (PLEDs) with overriding fast activity at 1 Hz, at times rhythmic were also noted in left hemisphere, maximal left parieto-occipital region. Hyperventilation and photic stimulation were not performed.     Event button was pressed on 8/25/121 at 0511 and at 1433 for right upper extremity shaking. Concomitant EEG showed rhythmic PLEds in left hemisphere, maximal left parieto-occipital region consistent with focal motor seizure.  ABNORMALITY -Focal motor seizure, left hemisphere -PLEds with overriding fast activity, left hemisphere -Continuous slow, generalized and maximal left parieto-occipital region  IMPRESSION: This study showed focal motor seizure arising from left hemisphere. PLEDs with overriding fast activity were also noted which is on the ictal-interictal continuum.  Additionally, there is evidence of cortical dysfunction in left hemisphere, maximal left parieto-occipital region secondary to underlying structural abnormality.   Lus Kriegel  [name]

## 2020-08-23 DIAGNOSIS — R4182 Altered mental status, unspecified: Secondary | ICD-10-CM

## 2020-08-23 LAB — PHENOBARBITAL LEVEL: Phenobarbital: 14.2 ug/mL — ABNORMAL LOW (ref 15.0–30.0)

## 2020-08-23 LAB — PHENYTOIN LEVEL, TOTAL: Phenytoin Lvl: 11.4 ug/mL (ref 10.0–20.0)

## 2020-08-23 MED ORDER — METOPROLOL TARTRATE 5 MG/5ML IV SOLN
INTRAVENOUS | Status: AC
  Filled 2020-08-23: qty 5

## 2020-08-23 MED ORDER — PHENOBARBITAL SODIUM 65 MG/ML IJ SOLN
65.0000 mg | Freq: Every morning | INTRAMUSCULAR | Status: DC
  Administered 2020-08-24 – 2020-08-25 (×2): 65 mg via INTRAVENOUS
  Filled 2020-08-23 (×2): qty 1

## 2020-08-23 MED ORDER — CHLORHEXIDINE GLUCONATE CLOTH 2 % EX PADS
6.0000 | MEDICATED_PAD | Freq: Every day | CUTANEOUS | Status: DC
  Administered 2020-08-23 – 2020-09-13 (×22): 6 via TOPICAL

## 2020-08-23 MED ORDER — SODIUM CHLORIDE 0.9 % IV SOLN
125.0000 mg | Freq: Three times a day (TID) | INTRAVENOUS | Status: DC
  Administered 2020-08-23 – 2020-08-25 (×6): 125 mg via INTRAVENOUS
  Filled 2020-08-23 (×12): qty 2.5

## 2020-08-23 MED ORDER — METOPROLOL TARTRATE 5 MG/5ML IV SOLN
5.0000 mg | Freq: Four times a day (QID) | INTRAVENOUS | Status: DC
  Administered 2020-08-23 – 2020-08-25 (×6): 5 mg via INTRAVENOUS
  Filled 2020-08-23 (×6): qty 5

## 2020-08-23 MED ORDER — ORAL CARE MOUTH RINSE
15.0000 mL | Freq: Two times a day (BID) | OROMUCOSAL | Status: DC
  Administered 2020-08-23 – 2020-09-12 (×35): 15 mL via OROMUCOSAL

## 2020-08-23 MED ORDER — CHLORHEXIDINE GLUCONATE 0.12 % MT SOLN
15.0000 mL | Freq: Two times a day (BID) | OROMUCOSAL | Status: DC
  Administered 2020-08-23 – 2020-09-13 (×43): 15 mL via OROMUCOSAL
  Filled 2020-08-23 (×35): qty 15

## 2020-08-23 MED ORDER — LEVOTHYROXINE SODIUM 100 MCG/5ML IV SOLN: 37.5000 ug | Freq: Every day | INTRAVENOUS | Status: DC

## 2020-08-23 MED ORDER — LEVOTHYROXINE SODIUM 100 MCG/5ML IV SOLN
25.0000 ug | Freq: Once | INTRAVENOUS | Status: AC
  Administered 2020-08-23: 25 ug via INTRAVENOUS
  Filled 2020-08-23: qty 5

## 2020-08-23 MED ORDER — PERAMPANEL 2 MG PO TABS
4.0000 mg | ORAL_TABLET | Freq: Every day | ORAL | Status: DC
  Administered 2020-08-24 – 2020-08-27 (×4): 4 mg
  Filled 2020-08-23 (×5): qty 2

## 2020-08-23 MED ORDER — SODIUM CHLORIDE 0.9 % IV SOLN
100.0000 mg | Freq: Two times a day (BID) | INTRAVENOUS | Status: DC
  Administered 2020-08-23 – 2020-08-24 (×4): 100 mg via INTRAVENOUS
  Filled 2020-08-23 (×7): qty 10

## 2020-08-23 MED ORDER — PHENOBARBITAL SODIUM 65 MG/ML IJ SOLN: 65.0000 mg | Freq: Every day | INTRAMUSCULAR | Status: DC

## 2020-08-23 MED ORDER — LEVOTHYROXINE SODIUM 50 MCG PO TABS: 50.0000 ug | ORAL_TABLET | Freq: Every day | ORAL | Status: DC

## 2020-08-23 NOTE — Progress Notes (Signed)
OT Cancellation Note  Patient Details Name: [name] MRN: [id number] DOB: Feb 27, 1930   Cancelled Treatment:    Reason Eval/Treat Not Completed: Medical issues which prohibited therapy.  Pt transferring to ICU for closer monitoring due to decreased arousal and seizures.[name]., OTR/L Acute Rehabilitation Services Pager [phone] Office [phone]   [name] 08/23/2020, 12:51 PM

## 2020-08-23 NOTE — Progress Notes (Signed)
LTM maint complete - no skin breakdown under: o1,a1

## 2020-08-23 NOTE — Progress Notes (Addendum)
PROGRESS NOTE    Lori Bridges  TGY:[id number] DOB: 04-18-1930 DOA: 08/18/2020 PCP: Derinda Late, MD   Brief Narrative:   Lori Bridges is a 86 y.o. female with medical history significant of paroxysmal A. fib, chronic diastolic CHF, hypertension, hyperlipidemia, GERD, recent ICH, degenerative disc disease, CKD stage IIIa presents to emergency department with right-sided seizure-like activity.  patient recently hospitalized due to [place] due to uncontrolled hypertension and right-sided  seizure-like activity on 7/29 and discharged home on 8/19 from rehab in stable condition on Keppra 250 mg twice daily.  EEG showed epileptogenicity in the left frontal lobe but no seizure activity.  Her Coumadin was discontinued. patient was doing fine after the discharge however on 08/18/2020 she started feeling dizzy and complaining of involuntary movements of right arm and leg and unable to bear weight on right leg.  Her symptoms started getting worse therefore family brought patient to the emergency department for further evaluation and management. Has chronic bilateral lower leg swelling however no history of orthopnea.  Upon arrival to ED: Patient tachycardic, afebrile, initial labs unremarkable.  CT head negative for acute findings.  EDP consulted neurology.  She received Keppra 1000 IV once and Ativan 1 mg and started on 500 mg IV. twice daily.  Admitted under hospitalist service.  Assessment & Plan:   Principal Problem:   Seizures ([place]) Active Problems:   Hyperlipidemia   Chronic diastolic CHF (congestive heart failure) (HCC)   ICH (intracerebral hemorrhage) (HCC) - 2 small L brain ICH, likely d/t HTN on subtherapeutic warfarin   CKD (chronic kidney disease), stage IIIa   Hypothyroidism   PAF (paroxysmal atrial fibrillation) (HCC)   Gastroesophageal reflux disease   Seizure disorder (HCC)   Seizure disorder/partial status epilepticus: -History of recent ICH due to uncontrolled hypertension &  post stroke seizure disorder.  Recently discharged from rehab on 8/19 on Keppra 250 mg twice daily -CT head negative for acute findings.  Patient received Keppra 1000 mg IV once and Ativan 1 mg in ED a.m. started on Keppra 500 mg twice daily by neurology.patient again had several episodes of right arm seizure activities overnight received Ativan and neurology was notified.  Her dose of Keppra was increased to 750 mg IV twice daily on 08/21/2020 and despite of that, she continued to have more episodes of seizure so she was loaded with Dilantin 1 g IV and started on Dilantin 100 mg p.o. 3 times daily.  Patient continued to have more right upper extremity twitching all day of 08/21/2020 and morning of 08/22/2020.  She was seen by neurology again today.  Her Dilantin was increased to 125 mg 3 times daily and she has received a loading dose of perampanel but despite of that, she continued to have more episodes of focal seizure.  She also became more lethargic.  Due to more seizures, she was given a loading dose of Vimpat last night.  And couple of doses of Ativan to terminate focal seizures.  She was seen by Dr. Hortense Ramal of neurology today.  Patient remains very lethargic.  Her GCS upon my evaluation was close to 8-9.  Dr. Hortense Ramal had discussed with the family yesterday about possible need of elective intubation to start suppression burst therapy.  Family was going to discuss among themselves (patient has 6 daughters) and decide with final CODE STATUS.  I was able to talk face-to-face with patient's daughter named Lori Bridges.  She told me that the family have not discussed CODE STATUS further as patient's  1 daughter who happens to be power of attorney as well, Had recently lost her husband and she is preparing the funeral for him and family does not want to bother that sister.  I called Dr. Hortense Ramal stat due to worsening situation.  Dr. Hortense Ramal saw patient at the bedside and call PCCM to consider for elective intubation and start burst  suppression therapy.  Patient will be transferred to ICU.  Acute encephalopathy: Secondary to seizure and AEDs.  Paroxysmal A. Fib: Currently in sinus rhythm.  Continue metoprolol.  Coumadin was discontinued on previous hospitalization due to [place].  On telemetry.    History of ICH: -Reviewed CT head-shows improvement. -Off of Coumadin  Hypothyroidism: Continue Synthroid.  CKD stage IIIa: At baseline. -Continue to monitor  GERD: Hold PPI  Chronic diastolic CHF: Patient appears euvolemic.  Has chronic bilateral lower extremity edema.  On room air.  No dyspnea or tachypnea.  She received IV Lasix for first 2 days of hospitalization. -Reviewed echo from 06/04/2020 which showed ejection fraction of 60 to 65%, severe TR. continue home dose of Lasix 20 mg p.o. twice daily.  DVT prophylaxis: SCDs Start: 08/19/20 1317   Code Status: Full Code  Family Communication: Daughter present at bedside.  Plan of care discussed with patient in length and she verbalized understanding and agreed with it.  Status is: Inpatient  Remains inpatient appropriate because:Inpatient level of care appropriate due to severity of illness   Dispo: The patient is from: SNF              Anticipated d/c is to: SNF              Anticipated d/c date is: >3 days              Patient currently is not medically stable to d/c.        Estimated body mass index is 32.22 kg/m as calculated from the following:   Height as of this encounter: 5\' 7"  (1.702 m).   Weight as of this encounter: 93.3 kg.      Nutritional status:               Consultants:   Neurology  Procedures:   None  Antimicrobials:  Anti-infectives (From admission, onward)   None         Subjective: Seen and examined.  Daughter at the bedside.  Patient very lethargic.  She arouses to painful stimuli.  Unable to have any conversation.  Her GCS is close to 8-9.  Objective: Vitals:   08/22/20 2233 08/22/20 2334 08/23/20  0320 08/23/20 0746  BP: 101/64 103/86 115/84 115/60  Pulse: (!) 109 90 96 (!) 111  Resp: 16 18 18 18   Temp: 98.3 F (36.8 C) 97.7 F (36.5 C) 97.8 F (36.6 C) 98.1 F (36.7 C)  TempSrc: Oral Axillary Axillary Axillary  SpO2: 97% 98% 98% 100%  Weight:   93.3 kg   Height:        Intake/Output Summary (Last 24 hours) at 08/23/2020 1052 Last data filed at 08/23/2020 0300 Gross per 24 hour  Intake 790.36 ml  Output 650 ml  Net 140.36 ml   Filed Weights   08/21/20 0316 08/22/20 0321 08/23/20 0320  Weight: 90.5 kg 90.2 kg 93.3 kg    Examination:  General exam: Very lethargic Respiratory system: Clear to auscultation. Respiratory effort normal. Cardiovascular system: S1 & S2 heard, RRR. No JVD, murmurs, rubs, gallops or clicks. No pedal edema. Gastrointestinal system: Abdomen is  nondistended, soft and nontender. No organomegaly or masses felt. Normal bowel sounds heard. Central nervous system: Very lethargic.  Moans and flexes to painful stimuli  Data Reviewed: I have personally reviewed following labs and imaging studies  CBC: Recent Labs  Lab 08/18/20 1746 08/19/20 1444 08/20/20 0301  WBC 4.1 4.8 3.3*  NEUTROABS  --  3.0  --   HGB 12.7 13.6 12.1  HCT 41.3 44.5 38.8  MCV 97.4 99.6 97.2  PLT 189 157 950   Basic Metabolic Panel: Recent Labs  Lab 08/18/20 1746 08/19/20 1444 08/20/20 0301 08/21/20 0749 08/22/20 1550  NA 141 141 143 141 139  K 4.1 4.0 4.0 3.5 4.1  CL 105 106 106 104 105  CO2 24 23 25 27 24   GLUCOSE 103* 76 66* 84 104*  BUN 19 17 17 17 18   CREATININE 1.07* 1.10* 1.12* 1.13* 1.00  CALCIUM 10.0 10.2 10.1 10.0 9.6  MG  --  2.2  --  2.0  --   PHOS  --  3.5  --   --   --    GFR: Estimated Creatinine Clearance: 45.6 mL/min (by C-G formula based on SCr of 1 mg/dL). Liver Function Tests: Recent Labs  Lab 08/18/20 1746 08/19/20 1444 08/20/20 0301  AST 33 30 27  ALT 30 27 23   ALKPHOS 191* 175* 153*  BILITOT 1.5* 2.0* 2.0*  PROT 6.5 6.3* 5.8*    ALBUMIN 3.2* 3.2* 2.8*   Recent Labs  Lab 08/18/20 1746  LIPASE 91*   Recent Labs  Lab 08/19/20 1434  AMMONIA 25   Coagulation Profile: Recent Labs  Lab 08/19/20 1444  INR 1.2   Cardiac Enzymes: No results for input(s): CKTOTAL, CKMB, CKMBINDEX, TROPONINI in the last 168 hours. BNP (last 3 results) No results for input(s): PROBNP in the last 8760 hours. HbA1C: No results for input(s): HGBA1C in the last 72 hours. CBG: Recent Labs  Lab 08/21/20 2020 08/22/20 1807  GLUCAP 100* 126*   Lipid Profile: No results for input(s): CHOL, HDL, LDLCALC, TRIG, CHOLHDL, LDLDIRECT in the last 72 hours. Thyroid Function Tests: No results for input(s): TSH, T4TOTAL, FREET4, T3FREE, THYROIDAB in the last 72 hours. Anemia Panel: No results for input(s): VITAMINB12, FOLATE, FERRITIN, TIBC, IRON, RETICCTPCT in the last 72 hours. Sepsis Labs: No results for input(s): PROCALCITON, LATICACIDVEN in the last 168 hours.  Recent Results (from the past 240 hour(s))  SARS Coronavirus 2 by RT PCR (hospital order, performed in [facility] hospital lab) Nasopharyngeal Nasopharyngeal Swab     Status: None   Collection Time: 08/19/20 12:54 PM   Specimen: Nasopharyngeal Swab  Result Value Ref Range Status   SARS Coronavirus 2 NEGATIVE NEGATIVE Final    Comment: (NOTE) SARS-CoV-2 target nucleic acids are NOT DETECTED.  The SARS-CoV-2 RNA is generally detectable in upper and lower respiratory specimens during the acute phase of infection. The lowest concentration of SARS-CoV-2 viral copies this assay can detect is 250 copies / mL. A negative result does not preclude SARS-CoV-2 infection and should not be used as the sole basis for treatment or other patient management decisions.  A negative result may occur with improper specimen collection / handling, submission of specimen other than nasopharyngeal swab, presence of viral mutation(s) within the areas targeted by this assay, and inadequate  number of viral copies (<250 copies / mL). A negative result must be combined with clinical observations, patient history, and epidemiological information.  Fact Sheet for Patients:   StrictlyIdeas.no  Fact Sheet for  Healthcare Providers: BankingDealers.co.za  This test is not yet approved or  cleared by the [place] and has been authorized for detection and/or diagnosis of SARS-CoV-2 by FDA under an Emergency Use Authorization (EUA).  This EUA will remain in effect (meaning this test can be used) for the duration of the COVID-19 declaration under Section 564(b)(1) of the Act, 21 U.S.C. section 360bbb-3(b)(1), unless the authorization is terminated or revoked sooner.  Performed at [facility], [place] [address] [place], [place] [address]       Radiology Studies: CT HEAD WO CONTRAST  Result Date: 08/22/2020 CLINICAL DATA:  Neuro deficit, acute, stroke suspected Involuntary right-sided movement. EXAM: CT HEAD WITHOUT CONTRAST TECHNIQUE: Contiguous axial images were obtained from the base of the skull through the vertex without intravenous contrast. COMPARISON:  Multiple prior exams. Most recent head CT 3 days ago. Brain MRI 07/22/2020 FINDINGS: Brain: Mild motion artifact limitations. Similar-appearing low-density in the left parietal and occipital lobes at site of prior hemorrhage. No interval change from recent prior. No residual blood products. No new or acute hemorrhage. No evidence of acute ischemia. Stable degree of chronic small vessel ischemia. No hydrocephalus. No midline shift. No extra-axial or subdural collection. Vascular: No hyperdense vessel. Skull base atherosclerosis is partially obscured by motion. Skull: No fracture or focal lesion. Sinuses/Orbits: No acute findings. Other: EEG leads overlie the scalp. IMPRESSION: 1. Stable head CT over the last 3 days. 2. Unchanged low-density in the left parietal and occipital  lobes at site of prior hemorrhage. No residual blood products. 3. Stable degree of chronic small vessel ischemia. Electronically Signed   By: Keith Rake M.D.   On: 08/22/2020 20:57   Overnight EEG with video  Result Date: 08/22/2020 Lora Havens, MD     08/22/2020  1:47 PM Patient Name: Lori Bridges MRN: [id number] Epilepsy Attending: Lora Havens Referring Physician/Provider: Dr. Zeb Comfort Duration: 08/21/2020 1525 to 08/22/2020 1300  Patient history: 86 year old female with history of seizures presented with breakthrough seizures and status epilepticus.  EEG to assess for seizures.  Level of alertness: Awake, asleep  AEDs during EEG study: Keppra  Technical aspects: This EEG study was done with scalp electrodes positioned according to the 10-20 International system of electrode placement. Electrical activity was acquired at a sampling rate of 500Hz  and reviewed with a high frequency filter of 70Hz  and a low frequency filter of 1Hz . EEG data were recorded continuously and digitally stored.  Description: No definite posterior dominant rhythm was seen.  EEG showed continuous generalized and maximal left parieto-occipital region 3 to 5 Hz theta-delta slowing.  Periodic lateralized epileptiform discharges (PLEDs) with overriding fast activity at 1 Hz, at times rhythmic were also noted in left hemisphere, maximal left parieto-occipital region. Hyperventilation and photic stimulation were not performed.   Event button was pressed on 8/25/121 at 0511 for right upper extremity shaking. Concomitant EEG showed rhythmic PLEds in left hemisphere, maximal left parieto-occipital region consistent with focal motor seizure. ABNORMALITY -Focal motor seizure, left hemisphere -PLEds with overriding fast activity, left hemisphere -Continuous slow, generalized and maximal left parieto-occipital region  IMPRESSION: This study showed focal motor seizure arising from left hemisphere. PLEDs with overriding fast  activity were also noted which is on the ictal-interictal continuum.  Additionally, there is evidence of cortical dysfunction in left hemisphere, maximal left parieto-occipital region secondary to underlying structural abnormality.  Priyanka [name]    Scheduled Meds: . furosemide  20 mg Oral BID  . [  START ON 08/24/2020] levothyroxine  50 mcg Oral QAC breakfast  . metoprolol tartrate  75 mg Oral BID   Continuous Infusions: . levETIRAcetam 1,000 mg (08/23/20 0906)  . phenytoin (DILANTIN) IV       LOS: 4 days   Time spent: 33 minutes   Darliss Cheney, MD [facility]  08/23/2020, 10:52 AM   To contact the attending provider between 7A-7P or the covering provider during after hours 7P-7A, please log into the web site [email or web address].

## 2020-08-23 NOTE — Progress Notes (Signed)
PT Cancellation Note  Patient Details Name: [name] MRN: [id number] DOB: 11-Jan-1930   Cancelled Treatment:    Reason Eval/Treat Not Completed: Patient not medically ready.  Pt transferring to ICU for closer monitoring due to decreased arousal and seizures.  Will hold today and see 8/27 as able. 08/23/2020  Ginger Carne., PT Acute Rehabilitation Services [phone]  (pager) [phone]  (office)   Tessie Fass Taryll Reichenberger 08/23/2020, 1:30 PM

## 2020-08-23 NOTE — Plan of Care (Signed)

## 2020-08-23 NOTE — Consult Note (Addendum)
NAME:  Lori Bridges, MRN:  [id number], DOB:  20-Dec-1930, LOS: 4 ADMISSION DATE:  08/18/2020, CONSULTATION DATE: 08/23/2020 REFERRING MD: Neurology, CHIEF COMPLAINT: Decreased level of consciousness  Brief History   86 year old with uncontrolled seizures  History of present illness   86 year old female with a history of uncontrolled hypertension who several months ago had an intracerebral hemorrhage requiring hospitalization and subsequent rehabilitation.  She was discharged home utilizing a walker but I was able to get around.  She is brought back to the hospital for decreased level consciousness and shaking movement of the right arm.  Neurology has been consulted in the morning giving increasing doses of antiseizure medication which is now causing the lethargy.  There is a concern for airway compromise therefore she will be transferred to intensive care unit for closer monitoring.  There has been discussion of intubation if needed although there are ongoing discussions with family about for T of this minute 86 year old with multiple health issues.  Past Medical History   Past Medical History:  Diagnosis Date  . Adenomatous colon polyp 2005  . Anxiety age 36  . Carotid artery bruit 2001   left  . CKD (chronic kidney disease), stage III   . DDD (degenerative disc disease)   . DJD (degenerative joint disease)   . Gastroenteritis 2011  . GERD (gastroesophageal reflux disease) 2003  . History of pneumonia   . HTN (hypertension) 1983  . Hyperlipidemia 2000  . Hypothyroidism 2009  . Iron deficiency anemia 1983  . Osteoarthritis 2001  . Osteoporosis 2001  . Permanent atrial fibrillation ([place]) 07/23/2010       . Persistent headaches 1987  . Pulmonary HTN ([place]) 04/03/2017   Moderate with PASP 53mmHg by echo 02/2017  . Tricuspid regurgitation    severe by echo 02/25/12  . Vertigo   . Vitamin D deficiency 2011     Significant Hospital Events   Transferred to the intensive care unit  08/23/2020  Consults:  Neurology Pulmonary critical care  Procedures:  Continuous EEG  Significant Diagnostic Tests:  LTM shows continual epileptiform discharges   Micro Data:  SARS CoV2 - negative.  Antimicrobials:  none  Interim history/subjective:  86 year old female with refractory seizures been transferred to intensive care unit for further evaluation and treatment.     Objective   Blood pressure 115/60, pulse (!) 111, temperature 98.1 F (36.7 C), temperature source Axillary, resp. rate 18, height 5\' 7"  (1.702 m), weight 93.3 kg, SpO2 100 %.        Intake/Output Summary (Last 24 hours) at 08/23/2020 1207 Last data filed at 08/23/2020 0300 Gross per 24 hour  Intake 790.36 ml  Output 650 ml  Net 140.36 ml   Filed Weights   08/21/20 0316 08/22/20 0321 08/23/20 0320  Weight: 90.5 kg 90.2 kg 93.3 kg    Examination: General:  Small, frail woman appearing stated age  HENT: no icterus Lungs: chest clear.  Cardiovascular: JVP not elevate, HS normal, extremities warm. Abdomen: soft and non-tender Extremities: edematous Neuro: somnolent but will respond occasionally to voice and moan to pain. GU: Purewick in place.   Resolved Hospital Problem list     Assessment & Plan:  Decreased level of consciousness secondary to analeptics attempting to control seizure activity.  PCCM asked to transfer to ICU for possibility of intubation for burst suppression of seizures. Transfer to intensive care unit Hold off on intubation for now Continue treatment for seizures hopefully will not require intubation Ongoing session with family  concerning goals of care currently full co  History of intracerebral hemorrhage Status post rehab Now with new onset of uncontrolled seizures  Hypertension When point with high-dose antiseizure medication Monitor intensive care unit  Chronic kidney disease Lab Results  Component Value Date   CREATININE 1.00 08/22/2020   CREATININE 1.13 (H)  08/21/2020   CREATININE 1.12 (H) 08/20/2020   Avoid nephrotoxins  Best practice:  Diet: NPO - will place feeding tube as at high nutritional risk Pain/Anxiety/Delirium protocol (if indicated): Not indicated VAP protocol (if indicated): Not indicated DVT prophylaxis: Sequential compression hose GI prophylaxis: PPI Glucose control: Sliding scale Mobility: Bedrest Code Status: Full Family Communication: Family updated at bedside by physician 08/23/2020 Disposition: Transfer to intensive care unit for closer  Labs   CBC: Recent Labs  Lab 08/18/20 1746 08/19/20 1444 08/20/20 0301  WBC 4.1 4.8 3.3*  NEUTROABS  --  3.0  --   HGB 12.7 13.6 12.1  HCT 41.3 44.5 38.8  MCV 97.4 99.6 97.2  PLT 189 157 749    Basic Metabolic Panel: Recent Labs  Lab 08/18/20 1746 08/19/20 1444 08/20/20 0301 08/21/20 0749 08/22/20 1550  NA 141 141 143 141 139  K 4.1 4.0 4.0 3.5 4.1  CL 105 106 106 104 105  CO2 24 23 25 27 24   GLUCOSE 103* 76 66* 84 104*  BUN 19 17 17 17 18   CREATININE 1.07* 1.10* 1.12* 1.13* 1.00  CALCIUM 10.0 10.2 10.1 10.0 9.6  MG  --  2.2  --  2.0  --   PHOS  --  3.5  --   --   --    GFR: Estimated Creatinine Clearance: 45.6 mL/min (by C-G formula based on SCr of 1 mg/dL). Recent Labs  Lab 08/18/20 1746 08/19/20 1444 08/20/20 0301  WBC 4.1 4.8 3.3*    Liver Function Tests: Recent Labs  Lab 08/18/20 1746 08/19/20 1444 08/20/20 0301  AST 33 30 27  ALT 30 27 23   ALKPHOS 191* 175* 153*  BILITOT 1.5* 2.0* 2.0*  PROT 6.5 6.3* 5.8*  ALBUMIN 3.2* 3.2* 2.8*   Recent Labs  Lab 08/18/20 1746  LIPASE 91*   Recent Labs  Lab 08/19/20 1434  AMMONIA 25    ABG    Component Value Date/Time   TCO2 28 04/03/2012 2232     Coagulation Profile: Recent Labs  Lab 08/19/20 1444  INR 1.2    Cardiac Enzymes: No results for input(s): CKTOTAL, CKMB, CKMBINDEX, TROPONINI in the last 168 hours.  HbA1C: Hgb A1c MFr Bld  Date/Time Value Ref Range Status    07/23/2020 02:43 AM 5.7 (H) 4.8 - 5.6 % Final    Comment:    (NOTE) Pre diabetes:          5.7%-6.4%  Diabetes:              >6.4%  Glycemic control for   <7.0% adults with diabetes     CBG: Recent Labs  Lab 08/21/20 2020 08/22/20 1807  GLUCAP 100* 126*    Review of Systems:   NA  Past Medical History  She,  has a past medical history of Adenomatous colon polyp (2005), Anxiety (age 44), Carotid artery bruit (2001), CKD (chronic kidney disease), stage III, DDD (degenerative disc disease), DJD (degenerative joint disease), Gastroenteritis (2011), GERD (gastroesophageal reflux disease) (2003), History of pneumonia, HTN (hypertension) (1983), Hyperlipidemia (2000), Hypothyroidism (2009), Iron deficiency anemia (1983), Osteoarthritis (2001), Osteoporosis (2001), Permanent atrial fibrillation ([place]) (07/23/2010), Persistent headaches (1987), Pulmonary HTN (  [place]) (04/03/2017), Tricuspid regurgitation, Vertigo, and Vitamin D deficiency (2011).   Surgical History    Past Surgical History:  Procedure Laterality Date  . COLONOSCOPY W/ BIOPSIES AND POLYPECTOMY  09/16/2004   adenomatous polyp, diverticulosis, internal hemorrhoids  . s/p BLT    . TUBAL LIGATION       Social History   reports that she has never smoked. She has never used smokeless tobacco. She reports that she does not drink alcohol and does not use drugs.   Family History   Her family history includes Diabetes in her mother; Heart disease (age of onset: 65) in her brother; Hypertension in her father; Liver cancer in her sister; Peripheral vascular disease in her father.   Allergies Allergies  Allergen Reactions  . Doxycycline Other (See Comments)    Reaction not recalled, but not tolerated   . Tape Other (See Comments)    SKIN IS VERY THIN- WILL TEAR EASILY!!  . Ezetimibe Other (See Comments)    Arthralgia, Myalgias  . Meclizine Other (See Comments) and Palpitations    Tachycardia  . Pravastatin Other (See  Comments)    Arthralgia, Myalgias  . Colestipol Other (See Comments)    Malaise  . Sulfamethoxazole Other (See Comments)    Reaction not recalled, but not tolerated  . Ace Inhibitors Cough  . Codeine Nausea Only  . Colestipol Hcl Other (See Comments)    Malaise  . Lovastatin Other (See Comments)    Reaction not recalled- possibly muscle pain  . Sulfa Antibiotics Other (See Comments)    Reaction not recalled, but not tolerated     Home Medications  Prior to Admission medications   Medication Sig Start Date End Date Taking? Authorizing Provider  furosemide (LASIX) 20 MG tablet Take 1 tablet (20 mg total) by mouth 2 (two) times daily. 08/15/20  Yes Angiulli, Lavon Paganini, PA-C  levETIRAcetam (KEPPRA) 250 MG tablet Take 1 tablet (250 mg total) by mouth 2 (two) times daily. 08/15/20  Yes Angiulli, Lavon Paganini, PA-C  metoprolol tartrate 75 MG TABS Take 75 mg by mouth 2 (two) times daily. 08/15/20  Yes Angiulli, Lavon Paganini, PA-C  pantoprazole (PROTONIX) 40 MG tablet Take 1 tablet (40 mg total) by mouth daily. Patient taking differently: Take 40 mg by mouth daily as needed (for reflux).  08/15/20 08/15/21 Yes Angiulli, Lavon Paganini, PA-C  SYNTHROID 50 MCG tablet Take 50 mcg by mouth daily before breakfast.   Yes [provider]  acetaminophen (TYLENOL) 325 MG tablet Take 2 tablets (650 mg total) by mouth every 4 (four) hours as needed for mild pain (or temp > 37.5 C (99.5 F)). 08/15/20   Angiulli, Lavon Paganini, PA-C  levothyroxine (SYNTHROID) 50 MCG tablet Take 1 tablet (50 mcg total) by mouth daily before breakfast. Patient not taking: Reported on 08/19/2020 08/15/20   Angiulli, Lavon Paganini, PA-C  melatonin 300 MCG TABS Take 5 tablets (1,500 mcg total) by mouth at bedtime. 08/15/20   [place], Lavon Paganini, PA-C      Kipp Brood, MD [facility] ICU Physician [place]  Pager: [phone] Mobile: [phone] After hours: [phone].

## 2020-08-23 NOTE — Procedures (Signed)
Patient Name:Lori Bridges EHO:[id number] Epilepsy Attending:Melenie Minniear [name] Referring Physician/Provider:Dr. Rozell Searing Duration:08/22/2020 1525 to 08/23/2020 1525  Patient history:86 year old female with history of seizures presented with breakthrough seizures and status epilepticus. EEG to assess for seizures.  Level of alertness:Awake, asleep  AEDs during EEG study:Keppra, Dilantin, perampanel  Technical aspects: This EEG study was done with scalp electrodes positioned according to the 10-20 International system of electrode placement. Electrical activity was acquired at a sampling rate of 500Hz  and reviewed with a high frequency filter of 70Hz  and a low frequency filter of 1Hz . EEG data were recorded continuously and digitally stored.   Description:No definite posterior dominant rhythm was seen. EEG showed continuous generalized and maximal left parieto-occipital region 3 to 5 Hz theta-delta slowing. Periodic lateralized epileptiform discharges (PLEDs) with overriding fast activity at 1 Hz, at times rhythmic were also noted in left hemisphere, maximal left parieto-occipital region. Hyperventilation and photic stimulation were not performed.   Event button was pressed on 8/25/121 at 1540 for right and left upper extremity shaking. Concomitant EEG showed rhythmic PLEds in left hemisphere, maximal left parieto-occipital region consistent with focal motor seizure.  ABNORMALITY -Focal motor seizure, left hemisphere -PLEds with overriding fast activity, left hemisphere -Continuous slow, generalized and maximal left parieto-occipital region  IMPRESSION: This study showed focal motor seizure arising from left hemisphere. PLEDs with overriding fast activity were also noted which is on the ictal-interictal continuum.  Additionally, there is evidence of cortical dysfunctioninleft hemisphere, maximal left parieto-occipital region secondary to underlying structural  abnormality.   Maansi Wike [name]

## 2020-08-23 NOTE — Progress Notes (Signed)
Pt is being discharged via Unit to ICU. Report has been given to oncoming nurse on unit. Pt has no pain (FACES). All belongings are being sent with patient. Daughter at bedside. All IV's are intact. All equipment sent with pt.

## 2020-08-23 NOTE — Progress Notes (Signed)
Pt not able to take PO meds. Dr. Myna Hidalgo informed and alternative route requested.

## 2020-08-23 NOTE — Progress Notes (Signed)
Subjective: Patient continued to have intermittent seizures overnight even after loading with phenobarb and Vimpat.  ROS: Unable to obtain due to poor mental status  Examination  Vital signs in last 24 hours: Temp:  [97.6 F (36.4 C)-98.3 F (36.8 C)] 98.1 F (36.7 C) (08/26 0746) Pulse Rate:  [80-111] 111 (08/26 0746) Resp:  [16-18] 18 (08/26 0746) BP: (101-123)/(60-86) 115/60 (08/26 0746) SpO2:  [96 %-100 %] 100 % (08/26 0746) Weight:  [93.3 kg] 93.3 kg (08/26 0320)  General: lying in bed,not in apparent distress CVS: pulse-normal rate and rhythm RS: breathing comfortably,CTAB Extremities: normal,warm  Neuro: WG:[id number], only moans to noxious stimuli, does not follow commands CN: pupils equal and reactive, no gaze deviation, rest of the cranial nerves difficult to assess secondary to encephalopathy  motor:Withdraws to noxious stimuli in all 4 extremities  Basic Metabolic Panel: Recent Labs  Lab 08/18/20 1746 08/18/20 1746 08/19/20 1444 08/19/20 1444 08/20/20 0301 08/21/20 0749 08/22/20 1550  NA 141  --  141  --  143 141 139  K 4.1  --  4.0  --  4.0 3.5 4.1  CL 105  --  106  --  106 104 105  CO2 24  --  23  --  25 27 24   GLUCOSE 103*  --  76  --  66* 84 104*  BUN 19  --  17  --  17 17 18   CREATININE 1.07*  --  1.10*  --  1.12* 1.13* 1.00  CALCIUM 10.0   < > 10.2   < > 10.1 10.0 9.6  MG  --   --  2.2  --   --  2.0  --   PHOS  --   --  3.5  --   --   --   --    < > = values in this interval not displayed.    CBC: Recent Labs  Lab 08/18/20 1746 08/19/20 1444 08/20/20 0301  WBC 4.1 4.8 3.3*  NEUTROABS  --  3.0  --   HGB 12.7 13.6 12.1  HCT 41.3 44.5 38.8  MCV 97.4 99.6 97.2  PLT 189 157 160     Coagulation Studies: No results for input(s): LABPROT, INR in the last 72 hours.  Imaging CT head without contrast 08/22/2020: Stable head CT over the last 3 days. Unchanged low-density in the left parietal and occipital lobes at site of prior  hemorrhage. No residual blood products. Stable degree of chronic small vessel ischemia.  ASSESSMENT AND PLAN: 86 year old female with recent left temporal hemorrhage (07/22/2020) and associated seizures who presented with status epilepticus  Focal motor status epilepticus  Epilepsy with breakthrough seizures Chronic left temporal ICH (present on admission) Acute encephalopathy, due to seizures and medications -Etiology of breakthrough seizures: No leukocytosis, normal electrolytes, no evidence of UTI. Chest x-ray concerning for CHF as well as pleural effusions. - Patient presented on 250 mg twice daily Keppra. Gradually we increase Keppra to 1000 mg twice daily (renally dosed), added Dilantin, perampanel, phenobarb and Vimpat. However patient continues to have intermittent clinical seizures which are now spreading to left upper extremity as well. Also patient's mentation has worsened and she is now significantly lethargic and only moans to noxious stimuli, does not follow commands.   Recommendations -Discussed CODE STATUS again with patient's daughter [name] at bedside who states as patient is full code they would like to proceed with intubation. -Called ICU physician to discuss elective intubation with plan to proceed with burst suppression. -Continue  AEDs as follows 1. Keppra 1000 mg twice daily 2. Phenytoin 125 mg 3 times daily 3. Vimpat 100 mg twice daily 4. Phenobarb 65 mg nightly -If seizures persist, can increase phenobarb to 65 mg twice daily -As needed IV Ativan 2 mg for clinical seizure-like activity. Please be cautious when administering Ativan to minimize excessive sedation -Management of rest of comorbidities per primary team   CRITICAL CARE Performed by: Lora Havens   Total critical care time: 35 minutes  Critical care time was exclusive of separately billable procedures and treating other patients.  Critical care was necessary to treat or prevent imminent or  life-threatening deterioration.  Critical care was time spent personally by me on the following activities: development of treatment plan with patient and/or surrogate as well as nursing, discussions with consultants, evaluation of patient's response to treatment, examination of patient, obtaining history from patient or surrogate, ordering and performing treatments and interventions, ordering and review of laboratory studies, ordering and review of radiographic studies, pulse oximetry and re-evaluation of patient's condition.   Zeb Comfort Epilepsy [facility] For questions after 5pm please refer to AMION to reach the Neurologist on call

## 2020-08-23 NOTE — Progress Notes (Signed)
She has been on Synthroid 47mcg qday PTA for hypothyroidism. Her TSH was elevated at 4.884. D/w Dr. Doristine Bosworth to increase the dose to 76mcg. She is too lethargic to take PO so we will change to 37.63mcg IV qday.  Onnie Boer, PharmD, BCIDP, AAHIVP, CPP Infectious Disease Pharmacist 08/23/2020 11:05 AM

## 2020-08-24 LAB — PHOSPHORUS
Phosphorus: 3.1 mg/dL (ref 2.5–4.6)
Phosphorus: 2.9 mg/dL (ref 2.5–4.6)

## 2020-08-24 LAB — GLUCOSE, CAPILLARY
Glucose-Capillary: 159 mg/dL — ABNORMAL HIGH (ref 70–99)
Glucose-Capillary: 134 mg/dL — ABNORMAL HIGH (ref 70–99)

## 2020-08-24 LAB — MAGNESIUM
Magnesium: 2.1 mg/dL (ref 1.7–2.4)
Magnesium: 2 mg/dL (ref 1.7–2.4)

## 2020-08-24 MED ORDER — PROSOURCE TF PO LIQD
45.0000 mL | Freq: Two times a day (BID) | ORAL | Status: DC
  Administered 2020-08-24 – 2020-09-10 (×36): 45 mL
  Filled 2020-08-24 (×37): qty 45

## 2020-08-24 MED ORDER — FUROSEMIDE 20 MG PO TABS
20.0000 mg | ORAL_TABLET | Freq: Two times a day (BID) | ORAL | Status: DC
  Administered 2020-08-24 – 2020-08-31 (×14): 20 mg
  Filled 2020-08-24 (×14): qty 1

## 2020-08-24 MED ORDER — OSMOLITE 1.2 CAL PO LIQD
1000.0000 mL | ORAL | Status: DC
  Administered 2020-08-24 – 2020-09-10 (×16): 1000 mL
  Filled 2020-08-24 (×20): qty 1000

## 2020-08-24 NOTE — Progress Notes (Signed)
PT Cancellation Note  Patient Details Name: LYRIK DOCKSTADER MRN: [id number] DOB: Dec 28, 1930   Cancelled Treatment:    Reason Eval/Treat Not Completed: Patient's level of consciousness. Pt remains obtunded on long term EEG. PT will hold until the pt is more alert and better able to participate in skilled PT intervention.   Zenaida Niece 08/24/2020, 3:52 PM

## 2020-08-24 NOTE — Progress Notes (Signed)
OT Cancellation Note  Patient Details Name: Lori Bridges MRN: [id number] DOB: 26-May-1930   Cancelled Treatment:    Reason Eval/Treat Not Completed: Medical issues which prohibited therapy .  Will reattempt.  [name]., OTR/L Acute Rehabilitation Services Pager [phone] Office [phone]  [name] 08/24/2020, 3:17 PM

## 2020-08-24 NOTE — Progress Notes (Signed)
Initial Nutrition Assessment  DOCUMENTATION CODES:   Not applicable  INTERVENTION:   Initiate tube feeding via Cortrak: Osmolite 1.2 at 55 ml/h (1320 ml per day) Prosource TF 45 ml BID  Provides 1664 kcal, 95 gm protein, 1070 ml free water daily   NUTRITION DIAGNOSIS:   Inadequate oral intake related to inability to eat as evidenced by NPO status.  GOAL:   Patient will meet greater than or equal to 90% of their needs  MONITOR:   TF tolerance, Diet advancement  REASON FOR ASSESSMENT:   Consult Enteral/tube feeding initiation and management  ASSESSMENT:   Pt with PMH of uncontrolled HTN who recently was hospitalized with a ICH. Pt was discharged home after inpatient rehab stay. Now admitted with seizures.   Pt obtunded in the ICU. Per CCM plan for cortrak placement today. Spoke with CCM PA, ok to start TF after tube placement.  RD familiar with pt from previous admission.  Pt had 3 cortrak tubes placed during last admission. Two pulled out by pt, the last removed as intake had improved.  Per RN exam pt with deep pitting edema in BLE and moderate generalized edema throughout.   Medications reviewed and include: lasix Labs reviewed   Diet Order:   Diet Order            Diet NPO time specified  Diet effective now                 EDUCATION NEEDS:   No education needs have been identified at this time  Skin:  Skin Assessment: Reviewed RN Assessment  Last BM:  unknown  Height:   Ht Readings from Last 1 Encounters:  08/19/20 5\' 7"  (1.702 m)    Weight:   Wt Readings from Last 1 Encounters:  08/23/20 93.3 kg    Ideal Body Weight:  59.1 kg  BMI:  Body mass index is 32.22 kg/m.  Estimated Nutritional Needs:   Kcal:  1550-1750  Protein:  80-95 grams  Fluid:  >1.5 L/day  Lockie Pares., RD, LDN, CNSC See AMiON for contact information

## 2020-08-24 NOTE — Progress Notes (Signed)
Subjective: No further clinical seizures overnight per nurse and daughter at bedside.  TML:YYTKPT to obtain due to poor mental status  Examination  Vital signs in last 24 hours: Temp:  [97.6 F (36.4 C)-99.6 F (37.6 C)] 97.6 F (36.4 C) (08/27 1200) Pulse Rate:  [33-147] 98 (08/27 1200) Resp:  [10-24] 17 (08/27 1200) BP: (80-128)/(54-93) 80/56 (08/27 1200) SpO2:  [93 %-98 %] 95 % (08/27 1200)  General: lying in bed,not in apparent distress CVS: pulse-normal rate and rhythm RS: breathing comfortably,CTAB Extremities: normal,warm  Neuro: WS:FKCLEXNTZ, only moans to noxious stimuli, does not follow commands however per nurse patient was following simple commands like sticking out her tongue and wiggling her toes earlier CN: pupils equal and reactive, no gaze deviation, rest of the cranial nerves difficult to assess secondary to encephalopathy  motor:Withdraws to noxious stimuli in all 4 extremities  Basic Metabolic Panel: Recent Labs  Lab 08/18/20 1746 08/18/20 1746 08/19/20 1444 08/19/20 1444 08/20/20 0301 08/21/20 0749 08/22/20 1550 08/24/20 1224  NA 141  --  141  --  143 141 139  --   K 4.1  --  4.0  --  4.0 3.5 4.1  --   CL 105  --  106  --  106 104 105  --   CO2 24  --  23  --  25 27 24   --   GLUCOSE 103*  --  76  --  66* 84 104*  --   BUN 19  --  17  --  17 17 18   --   CREATININE 1.07*  --  1.10*  --  1.12* 1.13* 1.00  --   CALCIUM 10.0   < > 10.2   < > 10.1 10.0 9.6  --   MG  --   --  2.2  --   --  2.0  --  2.1  PHOS  --   --  3.5  --   --   --   --  3.1   < > = values in this interval not displayed.    CBC: Recent Labs  Lab 08/18/20 1746 08/19/20 1444 08/20/20 0301  WBC 4.1 4.8 3.3*  NEUTROABS  --  3.0  --   HGB 12.7 13.6 12.1  HCT 41.3 44.5 38.8  MCV 97.4 99.6 97.2  PLT 189 157 160     Coagulation Studies: No results for input(s): LABPROT, INR in the last 72 hours.  Imaging No new brain imaging overnight.  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 Chest x-ray concerning for CHF as well as pleural effusions. -Patient presented on 250 mg twice daily Keppra. Gradually we increase Keppra to 1000 mg twice daily (renally dosed), added Dilantin, perampanel, phenobarb and Vimpat.  No further seizures overnight.  Recommendations -Continue AEDs as follows 1. Keppra 1000 mg twice daily 2. Phenytoin 125 mg 3 times daily 3. Vimpat 100 mg twice daily 4. Phenobarb 65 mg nightly 5.  Perampanel 4 mg nightly -If seizures recur, can increase phenobarb to 65 mg twice daily -As needed IV Ativan 2 mg for clinical seizure-like activity. Please be cautious when administering Ativan to minimize excessive sedation -If patient remains seizure-free tomorrow, can be moved  to progressive care -We will consider reducing AEDs after patient has been seizure-free for few days. -Plan discussed in detail with patient's daughter at bedside and RN -Management of rest of comorbidities per primary team   CRITICAL CARE Performed by: Lora Havens   Total critical care time: 35 minutes  Critical care time was exclusive of separately billable procedures and treating other patients.  Critical care was necessary to treat or prevent imminent or life-threatening deterioration.  Critical care was time spent personally by me on the following activities: development of treatment plan with patient and/or surrogate as well as nursing, discussions with consultants, evaluation of patient's response to treatment, examination of patient, obtaining history from  patient or surrogate, ordering and performing treatments and interventions, ordering and review of laboratory studies, ordering and review of radiographic studies, pulse oximetry and re-evaluation of patient's condition.    Zeb Comfort Epilepsy [facility] For questions after 5pm please refer to AMION to reach the Neurologist on call

## 2020-08-24 NOTE — Procedures (Addendum)
Patient Name:Lori Bridges QQP:[id number] Epilepsy Attending:Kahle Mcqueen [name] Referring Physician/Provider:Dr. Rozell Searing Duration:08/23/2020 1525 to 8/27/20211525  Patient history:86 year old female with history of seizures presented with breakthrough seizures and status epilepticus. EEG to assess for seizures.  Level of alertness:Awake, asleep  AEDs during EEG study:Keppra, Dilantin, perampanel, Vimpat, phenobarbital  Technical aspects: This EEG study was done with scalp electrodes positioned according to the 10-20 International system of electrode placement. Electrical activity was acquired at a sampling rate of 500Hz  and reviewed with a high frequency filter of 70Hz  and a low frequency filter of 1Hz . EEG data were recorded continuously and digitally stored.   Description:No definite posterior dominant rhythm was seen. EEG showed continuous generalized and maximal left parieto-occipital region 3 to 5 Hz theta-delta slowing. Periodic lateralized epileptiform discharges(PLEDs)with overriding fast activityat 1 Hz, at times rhythmicwere also noted in left hemisphere, maximal left parieto-occipital region.Hyperventilation and photic stimulation were not performed.  ABNORMALITY -PLEds with overriding fast activity, left hemisphere -Continuous slow, generalized and maximal left parieto-occipital region  IMPRESSION: This studyshowed PLEDswith overriding fast activity  which is on the ictal-interictal continuum. Additionally, there is evidence of cortical dysfunctioninleft hemisphere, maximal left parieto-occipital region secondary to underlying structural abnormality.   Merrisa Skorupski [name]

## 2020-08-24 NOTE — Progress Notes (Signed)
LTM maint complete - skin check at A2 mild skin irritation. F4, F8 no skin breakdown

## 2020-08-24 NOTE — Progress Notes (Signed)
NAME:  Lori Bridges, MRN:  [id number], DOB:  May 01, 1930, LOS: 5 ADMISSION DATE:  08/18/2020, CONSULTATION DATE: 08/23/2020 REFERRING MD: Neurology, CHIEF COMPLAINT: Decreased level of consciousness  Brief History   86 year old with uncontrolled seizures  History of present illness   86 year old female with a history of uncontrolled hypertension who several months ago had an intracerebral hemorrhage requiring hospitalization and subsequent rehabilitation.  She was discharged home utilizing a walker but I was able to get around.  She is brought back to the hospital for decreased level consciousness and shaking movement of the right arm.  Neurology has been consulted in the morning giving increasing doses of antiseizure medication which is now causing the lethargy.  There is a concern for airway compromise therefore she will be transferred to intensive care unit for closer monitoring.  There has been discussion of intubation if needed although there are ongoing discussions with family about for T of this minute 86 year old with multiple health issues.  Past Medical History   Past Medical History:  Diagnosis Date   Adenomatous colon polyp 2005   Anxiety age 33   Carotid artery bruit 2001   left   CKD (chronic kidney disease), stage III    DDD (degenerative disc disease)    DJD (degenerative joint disease)    Gastroenteritis 2011   GERD (gastroesophageal reflux disease) 2003   History of pneumonia    HTN (hypertension) 1983   Hyperlipidemia 2000   Hypothyroidism 2009   Iron deficiency anemia 1983   Osteoarthritis 2001   Osteoporosis 2001   Permanent atrial fibrillation ([place]) 07/23/2010        Persistent headaches 1987   Pulmonary HTN ([place]) 04/03/2017   Moderate with PASP 54mmHg by echo 02/2017   Tricuspid regurgitation    severe by echo 02/25/12   Vertigo    Vitamin D deficiency 2011     Significant Hospital Events   Transferred to the intensive care unit  08/23/2020  Consults:  Neurology Pulmonary critical care  Procedures:  Continuous EEG  Significant Diagnostic Tests:  LTM shows continual epileptiform discharges   Micro Data:  SARS CoV2 - negative.  Antimicrobials:  none  Interim history/subjective:  She has not required intubation.  No further seizure activity is evident on examination at this time   Objective   Blood pressure 108/79, pulse (!) 113, temperature 97.6 F (36.4 C), temperature source Oral, resp. rate 10, height 5\' 7"  (1.702 m), weight 93.3 kg, SpO2 95 %.        Intake/Output Summary (Last 24 hours) at 08/24/2020 0840 Last data filed at 08/24/2020 0700 Gross per 24 hour  Intake 577.5 ml  Output 760 ml  Net -182.5 ml   Filed Weights   08/21/20 0316 08/22/20 0321 08/23/20 0320  Weight: 90.5 kg 90.2 kg 93.3 kg    Examination: General: 86 year old female who is minimally responsive HEENT: No JVD or lymphadenopathy is appreciated Neuro: Grunts in acknowledgment of questions CV: Heart sounds are distant blood pressure 108/79 currently in atrial fibrillation with a rate of 107 PULM: 2 L nasal cannula generating O2 sats of 95% shallow respirations but she does have excursion GI: soft, bsx4 active  Extremities: warm/dry, 1+ edema  Skin: no rashes or lesions   Resolved Hospital Problem list     Assessment & Plan:  Decreased level of consciousness secondary to analeptics attempting to control seizure activity.  PCCM asked to transfer to ICU for possibility of intubation for burst suppression of seizures. Currently in  intensive care unit Not having seizures Can follow some simple commands but is look very lethargic Questionable need for intensive care unit if she does not need intubation  History of intracerebral hemorrhage Neurology is following EEG monitoring ongoing Not currently seizing  Hypertension Moot point in the setting of high-dose antiseizure medications Monitor in intensive care  unit  Chronic kidney disease Lab Results  Component Value Date   CREATININE 1.00 08/22/2020   CREATININE 1.13 (H) 08/21/2020   CREATININE 1.12 (H) 08/20/2020   Avoid nephrotoxins  Best practice:  Diet: NPO - will place feeding tube as at high nutritional risk Pain/Anxiety/Delirium protocol (if indicated): Not indicated VAP protocol (if indicated): Not indicated DVT prophylaxis: Sequential compression hose GI prophylaxis: PPI Glucose control: Sliding scale Mobility: Bedrest Code Status: Full Family Communication: Daughter updated at bedside 08/24/2020 Disposition: Transfer to intensive care unit for closer  Labs   CBC: Recent Labs  Lab 08/18/20 1746 08/19/20 1444 08/20/20 0301  WBC 4.1 4.8 3.3*  NEUTROABS  --  3.0  --   HGB 12.7 13.6 12.1  HCT 41.3 44.5 38.8  MCV 97.4 99.6 97.2  PLT 189 157 557    Basic Metabolic Panel: Recent Labs  Lab 08/18/20 1746 08/19/20 1444 08/20/20 0301 08/21/20 0749 08/22/20 1550  NA 141 141 143 141 139  K 4.1 4.0 4.0 3.5 4.1  CL 105 106 106 104 105  CO2 24 23 25 27 24   GLUCOSE 103* 76 66* 84 104*  BUN 19 17 17 17 18   CREATININE 1.07* 1.10* 1.12* 1.13* 1.00  CALCIUM 10.0 10.2 10.1 10.0 9.6  MG  --  2.2  --  2.0  --   PHOS  --  3.5  --   --   --    GFR: Estimated Creatinine Clearance: 45.6 mL/min (by C-G formula based on SCr of 1 mg/dL). Recent Labs  Lab 08/18/20 1746 08/19/20 1444 08/20/20 0301  WBC 4.1 4.8 3.3*    Liver Function Tests: Recent Labs  Lab 08/18/20 1746 08/19/20 1444 08/20/20 0301  AST 33 30 27  ALT 30 27 23   ALKPHOS 191* 175* 153*  BILITOT 1.5* 2.0* 2.0*  PROT 6.5 6.3* 5.8*  ALBUMIN 3.2* 3.2* 2.8*   Recent Labs  Lab 08/18/20 1746  LIPASE 91*   Recent Labs  Lab 08/19/20 1434  AMMONIA 25    ABG    Component Value Date/Time   TCO2 28 04/03/2012 2232     Coagulation Profile: Recent Labs  Lab 08/19/20 1444  INR 1.2    Cardiac Enzymes: No results for input(s): CKTOTAL, CKMB,  CKMBINDEX, TROPONINI in the last 168 hours.  HbA1C: Hgb A1c MFr Bld  Date/Time Value Ref Range Status  07/23/2020 02:43 AM 5.7 (H) 4.8 - 5.6 % Final    Comment:    (NOTE) Pre diabetes:          5.7%-6.4%  Diabetes:              >6.4%  Glycemic control for   <7.0% adults with diabetes     CBG: Recent Labs  Lab 08/21/20 2020 08/22/20 1807  GLUCAP 100* 126*     App cct 30 min  Richardson Landry Sayward Horvath ACNP Acute Care Nurse Practitioner [place] Please consult [place] 08/24/2020, 8:40 AM

## 2020-08-24 NOTE — Procedures (Signed)
Cortrak  Person Inserting Tube:  Bridges, Lori Bridges, Lori Bridges Tube Type:  Cortrak - 43 inches Tube Location:  Right nare Initial Placement:  Stomach Secured by: Bridle Technique Used to Measure Tube Placement:  Documented cm marking at nare/ corner of mouth Cortrak Secured At:  70 cm    Cortrak Tube Team Note:  Consult received to place a Cortrak feeding tube.   No x-ray is required. RN may begin using tube.   If the tube becomes dislodged please keep the tube and contact the Cortrak team at [email or web address] ([email or web address]) for replacement.  If after hours and replacement cannot be delayed, place a NG tube and confirm placement with an abdominal x-ray.   Lori Diebel King, MS, Lori Bridges, LDN Pager number available on Amion 

## 2020-08-24 NOTE — Progress Notes (Signed)
PROGRESS NOTE  Lori Bridges:[id number] DOB: January 16, 1930 DOA: 08/18/2020 PCP: Derinda Late, MD  Brief History   Lori Hymen Stewartis a 86 y.o.femalewith medical history significant ofparoxysmal A. fib, chronic diastolic CHF, hypertension, hyperlipidemia, GERD, recent ICH, degenerative disc disease, CKD stage IIIa presents to emergency department with right-sided seizure-like activity.  patient recently hospitalized due to [place] due to uncontrolled hypertension and right-sided seizure-like activity on 7/29 and discharged home on 8/19 from rehab in stable condition on Keppra 250 mg twice daily. EEG showed epileptogenicity in the left frontal lobe but no seizure activity. Her Coumadin was discontinued.patient was doing fine after the discharge however on 08/18/2020 she started feeling dizzy and complaining of involuntary movements of right arm and leg and unable to bear weight on right leg. Her symptoms started getting worse therefore family brought patient to the emergency department for further evaluation and management. Has chronic bilateral lower leg swelling however no history of orthopnea.  Upon arrival to ED: Patient tachycardic, afebrile, initial labs unremarkable.  CT head negative for acute findings. EDP consulted neurology. She received Keppra 1000 IV once and Ativan 1 mg and started on 500 mg IV. twice daily.  Admitted under hospitalist service.  The patient was transferred to the ICU shortly after presentation due to her decreased level of consciousness and concern for her ability to protect her airway. Although the patient remains a full code, the family is still unsettled as to whether or not they wish for her to be intubated should the need arise.   Neurology was consulted and the patient has been found to be in a post ictal state and to have a seizure disorder due to abnormailities caused by her history of intracranial hemorrhage.  Palliative care has been consulted to  assist with determining the goals of care.  Consultants  . Neurology . PCCM  Procedures  . EEG  Antibiotics   Anti-infectives (From admission, onward)   None    .  Subjective  The patient is resting quietly while undergoing her EEG. Dtr [name] is at bedside.  Objective   Vitals:  Vitals:   08/24/20 1400 08/24/20 1500  BP: (!) 132/95 (!) 108/91  Pulse: (!) 125 (!) 122  Resp: (!) 21 (!) 21  Temp:    SpO2: 100% 92%   Exam:  Constitutional:  . The patient is asleep during her EEG. No acute distress. Respiratory:  . No increased work of breathing. . No wheezes, rales, or rhonchi . No tactile fremitus Cardiovascular:  . Regular rate and rhythm . No murmurs, ectopy, or gallups. . No lateral PMI. No thrills. Abdomen:  . Abdomen is soft, non-tender, non-distended . No hernias, masses, or organomegaly . Normoactive bowel sounds.  Musculoskeletal:  . No cyanosis, clubbing, or edema Skin:  . No rashes, lesions, ulcers . palpation of skin: no induration or nodules Neurologic:  Unable to evaluate as the patient is unable to cooperate with exam. Psychiatric:  . Unable to evaluate as the patient is unable to cooperate with exam.  I have personally reviewed the following:   Today's Data  . Vitals, phosphorus, magnesium, phenobarbital, Pheytoin  Scheduled Meds: . chlorhexidine  15 mL Mouth Rinse BID  . Chlorhexidine Gluconate Cloth  6 each Topical Daily  . feeding supplement (PROSource TF)  45 mL Per Tube BID  . furosemide  20 mg Oral BID  . [START ON 08/26/2020] levothyroxine  37.5 mcg Intravenous Daily  . mouth rinse  15 mL Mouth Rinse q12n4p  .  metoprolol tartrate  5 mg Intravenous Q6H  . perampanel  4 mg Per Tube QHS  . PHENObarbital  65 mg Intravenous q AM   Continuous Infusions: . feeding supplement (OSMOLITE 1.2 CAL) 1,000 mL (08/24/20 1430)  . lacosamide (VIMPAT) IV Stopped (08/24/20 1052)  . levETIRAcetam Stopped (08/24/20 0857)  . phenytoin  (DILANTIN) IV 206 mL/hr at 08/24/20 1500    Principal Problem:   Seizures ([place]) Active Problems:   Hyperlipidemia   Chronic diastolic CHF (congestive heart failure) (HCC)   ICH (intracerebral hemorrhage) (HCC) - 2 small L brain ICH, likely d/t HTN on subtherapeutic warfarin   CKD (chronic kidney disease), stage IIIa   Hypothyroidism   PAF (paroxysmal atrial fibrillation) (HCC)   Gastroesophageal reflux disease   Seizure disorder (HCC)   LOS: 5 days   A & P  Seizure disorder/partial status epilepticus: -History of recent ICH due to uncontrolled hypertension & post stroke seizure disorder.  Recently discharged from rehab on 8/19 on Keppra 250 mg twice daily -CT head negative for acute findings.  Patient received Keppra 1000 mg IV once and Ativan 1 mg in ED a.m. started on Keppra 500 mg twice daily by neurology.patient again had several episodes of right arm seizure activities overnight received Ativan and neurology was notified.  Her dose of Keppra was increased to 750 mg IV twice daily on 08/21/2020 and despite of that, she continued to have more episodes of seizure so she was loaded with Dilantin 1 g IV and started on Dilantin 100 mg p.o. 3 times daily.  Patient continued to have more right upper extremity twitching all day of 08/21/2020 and morning of 08/22/2020.  She was seen by neurology again today.  Her Dilantin was increased to 125 mg 3 times daily and she has received a loading dose of perampanel but despite of that, she continued to have more episodes of focal seizure.  She also became more lethargic.  Due to more seizures, she was given a loading dose of Vimpat last night.  And couple of doses of Ativan to terminate focal seizures.  She was seen by Dr. Hortense Ramal of neurology today.  Patient remains very lethargic.  Her GCS upon my evaluation was close to 8-9.  Dr. Hortense Ramal had discussed with the family yesterday about possible need of elective intubation to start suppression burst therapy.  Family  was going to discuss among themselves (patient has 6 daughters) and decide with final CODE STATUS.  I was able to talk face-to-face with patient's daughter named [name].  She told me that the family have not discussed CODE STATUS further as patient's 1 daughter who happens to be power of attorney as well, Had recently lost her husband and she is preparing the funeral for him and family does not want to bother that sister.  I called Dr. Hortense Ramal stat due to worsening situation.  Dr. Hortense Ramal saw patient at the bedside and call PCCM to consider for elective intubation and start burst suppression therapy.  Patient will be transferred to ICU, although it is not clear that the family would want intubation. Palliative care has been consulted to clarify the situation.  Acute encephalopathy: Secondary to seizure and AEDs.  Seizure disorder: Evident on EEG. Continue AED's as per neurology.  Paroxysmal A. Fib: Currently in sinus rhythm.  Continue metoprolol.  Coumadin was discontinued on previous hospitalization due to [place].  On telemetry.    History of ICH: Reviewed CT head-shows improvement. No anticoagulation.  Hypothyroidism: Continue Synthroid.  CKD  stage IIIa: At baseline. Continue to monitor  GERD: Hold PPI  Chronic diastolic CHF: Patient appears euvolemic.  Has chronic bilateral lower extremity edema.  On room air.  No dyspnea or tachypnea.  She received IV Lasix for first 2 days of hospitalization. Reviewed echo from 06/04/2020 which showed ejection fraction of 60 to 65%, severe TR. Continue home dose of Lasix 20 mg p.o. twice daily.  DVT prophylaxis: SCDs Start: 08/19/20 1317 Code Status: Full Code  Family Communication: Daughter present at bedside.  Disposition:   Status is: Inpatient  Remains inpatient appropriate because:Inpatient level of care appropriate due to severity of illness  Dispo: The patient is from: SNF  Anticipated d/c is to: SNF  Anticipated  d/c date is: >3 days  Patient currently is not medically stable to d/c.  Tennessee Hanlon, DO [facility] Direct contact: see [email or web address]  7PM-7AM contact night coverage as above 08/24/2020, 5:04 PM  LOS: 5 days

## 2020-08-25 ENCOUNTER — Inpatient Hospital Stay (HOSPITAL_COMMUNITY): Payer: Medicare Other

## 2020-08-25 DIAGNOSIS — G40909 Epilepsy, unspecified, not intractable, without status epilepticus: Secondary | ICD-10-CM

## 2020-08-25 DIAGNOSIS — I5032 Chronic diastolic (congestive) heart failure: Secondary | ICD-10-CM

## 2020-08-25 DIAGNOSIS — G40201 Localization-related (focal) (partial) symptomatic epilepsy and epileptic syndromes with complex partial seizures, not intractable, with status epilepticus: Principal | ICD-10-CM

## 2020-08-25 LAB — BASIC METABOLIC PANEL
Anion gap: 7 (ref 5–15)
BUN: 23 mg/dL (ref 8–23)
CO2: 26 mmol/L (ref 22–32)
Calcium: 9.6 mg/dL (ref 8.9–10.3)
Chloride: 107 mmol/L (ref 98–111)
Creatinine, Ser: 0.85 mg/dL (ref 0.44–1.00)
GFR calc Af Amer: >60 mL/min (ref >60)
GFR calc non Af Amer: >60 mL/min (ref >60)
Glucose, Bld: 140 mg/dL — ABNORMAL HIGH (ref 70–99)
Potassium: 3.8 mmol/L (ref 3.5–5.1)
Sodium: 140 mmol/L (ref 135–145)

## 2020-08-25 LAB — CBC WITH DIFFERENTIAL/PLATELET
Abs Immature Granulocytes: 0.03 10*3/uL (ref 0.00–0.07)
Basophils Absolute: 0 10*3/uL (ref 0.0–0.1)
Basophils Relative: 0 %
Eosinophils Absolute: 0 10*3/uL (ref 0.0–0.5)
Eosinophils Relative: 0 %
HCT: 41.5 % (ref 36.0–46.0)
Hemoglobin: 13.1 g/dL (ref 12.0–15.0)
Immature Granulocytes: 0 %
Lymphocytes Relative: 7 %
Lymphs Abs: 0.5 10*3/uL — ABNORMAL LOW (ref 0.7–4.0)
MCH: 30.4 pg (ref 26.0–34.0)
MCHC: 31.6 g/dL (ref 30.0–36.0)
MCV: 96.3 fL (ref 80.0–100.0)
Monocytes Absolute: 0.8 10*3/uL (ref 0.1–1.0)
Monocytes Relative: 12 %
Neutro Abs: 5.4 10*3/uL (ref 1.7–7.7)
Neutrophils Relative %: 81 %
Platelets: 166 10*3/uL (ref 150–400)
RBC: 4.31 MIL/uL (ref 3.87–5.11)
RDW: 16.8 % — ABNORMAL HIGH (ref 11.5–15.5)
WBC: 6.8 10*3/uL (ref 4.0–10.5)
nRBC: 0 % (ref 0.0–0.2)

## 2020-08-25 LAB — MAGNESIUM
Magnesium: 2.1 mg/dL (ref 1.7–2.4)
Magnesium: 2.1 mg/dL (ref 1.7–2.4)

## 2020-08-25 LAB — GLUCOSE, CAPILLARY
Glucose-Capillary: 141 mg/dL — ABNORMAL HIGH (ref 70–99)
Glucose-Capillary: 139 mg/dL — ABNORMAL HIGH (ref 70–99)
Glucose-Capillary: 118 mg/dL — ABNORMAL HIGH (ref 70–99)
Glucose-Capillary: 95 mg/dL (ref 70–99)
Glucose-Capillary: 125 mg/dL — ABNORMAL HIGH (ref 70–99)
Glucose-Capillary: 139 mg/dL — ABNORMAL HIGH (ref 70–99)

## 2020-08-25 LAB — PHOSPHORUS
Phosphorus: 2.1 mg/dL — ABNORMAL LOW (ref 2.5–4.6)
Phosphorus: 2 mg/dL — ABNORMAL LOW (ref 2.5–4.6)

## 2020-08-25 MED ORDER — PHENYTOIN 125 MG/5ML PO SUSP
125.0000 mg | Freq: Three times a day (TID) | ORAL | Status: DC
  Administered 2020-08-25: 125 mg
  Filled 2020-08-25 (×4): qty 5

## 2020-08-25 MED ORDER — SODIUM CHLORIDE 0.9 % IV BOLUS
500.0000 mL | Freq: Once | INTRAVENOUS | Status: AC
  Administered 2020-08-25: 500 mL via INTRAVENOUS

## 2020-08-25 MED ORDER — POTASSIUM PHOSPHATES 15 MMOLE/5ML IV SOLN
20.0000 mmol | Freq: Once | INTRAVENOUS | Status: DC
  Filled 2020-08-25: qty 6.67

## 2020-08-25 MED ORDER — LEVETIRACETAM 100 MG/ML PO SOLN
1000.0000 mg | Freq: Two times a day (BID) | ORAL | Status: DC
  Administered 2020-08-25 – 2020-09-10 (×31): 1000 mg
  Filled 2020-08-25 (×32): qty 10

## 2020-08-25 MED ORDER — METOPROLOL TARTRATE 25 MG/10 ML ORAL SUSPENSION
50.0000 mg | Freq: Two times a day (BID) | ORAL | Status: DC
  Administered 2020-08-25 – 2020-08-27 (×5): 50 mg
  Filled 2020-08-25 (×5): qty 20

## 2020-08-25 MED ORDER — POTASSIUM & SODIUM PHOSPHATES 280-160-250 MG PO PACK: 1.0000 | PACK | Freq: Three times a day (TID) | ORAL | Status: DC

## 2020-08-25 MED ORDER — PHENOBARBITAL 60 MG/ML ORAL SUSPENSION: 60.0000 mg | Freq: Every day | ORAL | Status: DC

## 2020-08-25 MED ORDER — PHENYTOIN 100 MG/4ML PO SUSP
125.0000 mg | Freq: Three times a day (TID) | ORAL | Status: DC
  Administered 2020-08-25 – 2020-08-26 (×2): 125 mg
  Filled 2020-08-25 (×2): qty 5

## 2020-08-25 MED ORDER — PHENOBARBITAL 20 MG/5ML PO ELIX
60.0000 mg | ORAL_SOLUTION | Freq: Every day | ORAL | Status: DC
  Administered 2020-08-26 – 2020-09-10 (×16): 60 mg
  Filled 2020-08-25 (×16): qty 15

## 2020-08-25 MED ORDER — LEVOTHYROXINE SODIUM 75 MCG PO TABS
75.0000 ug | ORAL_TABLET | Freq: Every day | ORAL | Status: DC
  Administered 2020-08-25 – 2020-09-11 (×18): 75 ug
  Filled 2020-08-25 (×18): qty 1

## 2020-08-25 MED ORDER — POTASSIUM & SODIUM PHOSPHATES 280-160-250 MG PO PACK
1.0000 | PACK | Freq: Three times a day (TID) | ORAL | Status: AC
  Administered 2020-08-25 (×3): 1
  Filled 2020-08-25 (×3): qty 1

## 2020-08-25 MED ORDER — LACOSAMIDE 50 MG PO TABS
100.0000 mg | ORAL_TABLET | Freq: Two times a day (BID) | ORAL | Status: DC
  Administered 2020-08-25 – 2020-08-31 (×13): 100 mg
  Filled 2020-08-25 (×13): qty 2

## 2020-08-25 MED ORDER — PHENYTOIN 100 MG/4ML PO SUSP
125.0000 mg | Freq: Three times a day (TID) | ORAL | Status: DC
  Filled 2020-08-25: qty 5

## 2020-08-25 NOTE — Procedures (Addendum)
Patient Name:Lori Bridges HYH:[id number] Epilepsy Attending:Rashed Edler [name] Referring Physician/Provider:Dr. Rozell Searing Duration:08/24/2020 1525 to 8/28/20211525  Patient history:86 year old female with history of seizures presented with breakthrough seizures and status epilepticus. EEG to assess for seizures.  Level of alertness:Awake, asleep  AEDs during EEG study:Keppra,Dilantin, perampanel, Vimpat, phenobarbital  Technical aspects: This EEG study was done with scalp electrodes positioned according to the 10-20 International system of electrode placement. Electrical activity was acquired at a sampling rate of 500Hz  and reviewed with a high frequency filter of 70Hz  and a low frequency filter of 1Hz . EEG data were recorded continuously and digitally stored.   Description:No definite posterior dominant rhythm was seen. EEG showed continuous generalized and maximal left parieto-occipital region 3 to 5 Hz theta-delta slowing. Periodic lateralized epileptiform discharges(PLEDs), maximal left parieto-occipital region were seen at 0.5-1hz .Hyperventilation and photic stimulation were not performed.  ABNORMALITY -PLEds, left hemisphere -Continuous slow, generalized and maximal left parieto-occipital region  IMPRESSION: This studyshowed evidence of epileptogenicity and cortical dysfunction in left hemisphere, maximal left parieto-occipital region secondary to underlying structural abnormality. No seizures were seen throughout the study  EEG appears to be improving compared to previous day.   Haytham Maher [name]

## 2020-08-25 NOTE — Plan of Care (Signed)
  Problem: Clinical Measurements: Goal: Complications related to the disease process, condition or treatment will be avoided or minimized Outcome: Progressing   

## 2020-08-25 NOTE — Progress Notes (Signed)
[facility] Progress Note Patient Name: JANESA DOCKERY DOB: 04-Mar-1930 MRN: [id number]   Date of Service  08/25/2020  HPI/Events of Note  In / Out bladder cath x 3 with persistent urinary retention.  eICU Interventions  Order for Foley catheter placement entered.        Kerry Kass Joyice Magda 08/25/2020, 5:03 AM

## 2020-08-25 NOTE — Progress Notes (Signed)
Subjective: Not on any IV sedation. Continues to be sedated in the setting of multiple seizure meds and previous recurring seizures this admission. No clinical seizure activity per RN.   Objective: Current vital signs: BP (!) 89/64 (BP Location: Right Arm)   Pulse 98   Temp (!) 96.8 F (36 C) (Axillary)   Resp (!) 21   Ht 5\' 7"  (1.702 m)   Wt 87.6 kg   SpO2 96%   BMI 30.25 kg/m  Vital signs in last 24 hours: Temp:  [96.8 F (36 C)-97.7 F (36.5 C)] 96.8 F (36 C) (08/28 1200) Pulse Rate:  [55-148] 98 (08/28 1200) Resp:  [13-28] 21 (08/28 1200) BP: (89-132)/(57-95) 89/64 (08/28 1200) SpO2:  [91 %-100 %] 96 % (08/28 1200) Weight:  [87.6 kg] 87.6 kg (08/28 0500)  Intake/Output from previous day: 08/27 0701 - 08/28 0700 In: 1327.5 [NG/GT:852.5; IV Piggyback:475] Out: 350 [Urine:350] Intake/Output this shift: Total I/O In: 755 [NG/GT:330; IV Piggyback:425] Out: 150 [Urine:150] Nutritional status:  Diet Order            Diet NPO time specified  Diet effective now                HEENT: EEG leads in place Lungs: Breathing comfortably in the context of sleep state Ext: Diffuse edema  Neurologic Exam: Ment: Somnolent to obtunded. Will briefly keep eyes open when eyelids are passively raised by examiner during simultaneous extended sternal rub. Moans slightly during sternal rub. Not following any commands. No attempts to communicate. Not localizing to stimuli.  CN: Sluggishly reactive pupils. Corneal reflexes are present. No doll's eye reflex could be elicited. Cough and gag intact. Not grimacing to noxious.  Motor/Sensory: Does not move upper or lower extremities to noxious stimuli.    Lab Results: Results for orders placed or performed during the hospital encounter of 08/18/20 (from the past 48 hour(s))  Magnesium     Status: None   Collection Time: 08/24/20 12:24 PM  Result Value Ref Range   Magnesium 2.1 1.7 - 2.4 mg/dL    Comment: Performed at [facility], [place] [address], [place], [place] [address]  Phosphorus     Status: None   Collection Time: 08/24/20 12:24 PM  Result Value Ref Range   Phosphorus 3.1 2.5 - 4.6 mg/dL    Comment: Performed at [facility], [place] [address] [place], [place] [address]  Magnesium     Status: None   Collection Time: 08/24/20  5:54 PM  Result Value Ref Range   Magnesium 2.0 1.7 - 2.4 mg/dL    Comment: Performed at [facility], [place] [address], [place], [place] [address]  Phosphorus     Status: None   Collection Time: 08/24/20  5:54 PM  Result Value Ref Range   Phosphorus 2.9 2.5 - 4.6 mg/dL    Comment: Performed at [facility] [address], [place], [place] [address]  Glucose, capillary     Status: Abnormal   Collection Time: 08/24/20  7:02 PM  Result Value Ref Range   Glucose-Capillary 159 (H) 70 - 99 mg/dL    Comment: Glucose reference range applies only to samples taken after fasting for at least 8 hours.  Glucose, capillary     Status: Abnormal   Collection Time: 08/24/20 10:58 PM  Result Value Ref Range   Glucose-Capillary 134 (H) 70 - 99 mg/dL    Comment: Glucose reference range applies only to samples taken after  fasting for at least 8 hours.  Glucose, capillary     Status: Abnormal   Collection Time: 08/25/20  3:05 AM  Result Value Ref Range   Glucose-Capillary 141 (H) 70 - 99 mg/dL    Comment: Glucose reference range applies only to samples taken after fasting for at least 8 hours.  Magnesium     Status: None   Collection Time: 08/25/20  3:50 AM  Result Value Ref Range   Magnesium 2.1 1.7 - 2.4 mg/dL    Comment: Performed at [facility] [address] [place], [place] [address]  Phosphorus     Status: Abnormal   Collection Time: 08/25/20  3:50 AM  Result Value Ref Range   Phosphorus 2.1 (L) 2.5 - 4.6 mg/dL    Comment: Performed at [facility] [address], [facility], [place] [address]  Basic metabolic panel     Status: Abnormal   Collection Time:  08/25/20  3:50 AM  Result Value Ref Range   Sodium 140 135 - 145 mmol/L   Potassium 3.8 3.5 - 5.1 mmol/L   Chloride 107 98 - 111 mmol/L   CO2 26 22 - 32 mmol/L   Glucose, Bld 140 (H) 70 - 99 mg/dL    Comment: Glucose reference range applies only to samples taken after fasting for at least 8 hours.   BUN 23 8 - 23 mg/dL   Creatinine, Ser 0.85 0.44 - 1.00 mg/dL   Calcium 9.6 8.9 - 10.3 mg/dL   GFR calc non Af Amer >60 >60 mL/min   GFR calc Af Amer >60 >60 mL/min   Anion gap 7 5 - 15    Comment: Performed at [facility] [address] [place], [place] [address]  CBC with Differential/Platelet     Status: Abnormal   Collection Time: 08/25/20  3:50 AM  Result Value Ref Range   WBC 6.8 4.0 - 10.5 K/uL   RBC 4.31 3.87 - 5.11 MIL/uL   Hemoglobin 13.1 12.0 - 15.0 g/dL   HCT 41.5 36 - 46 %   MCV 96.3 80.0 - 100.0 fL   MCH 30.4 26.0 - 34.0 pg   MCHC 31.6 30.0 - 36.0 g/dL   RDW 16.8 (H) 11.5 - 15.5 %   Platelets 166 150 - 400 K/uL   nRBC 0.0 0.0 - 0.2 %   Neutrophils Relative % 81 %   Neutro Abs 5.4 1.7 - 7.7 K/uL   Lymphocytes Relative 7 %   Lymphs Abs 0.5 (L) 0.7 - 4.0 K/uL   Monocytes Relative 12 %   Monocytes Absolute 0.8 0 - 1 K/uL   Eosinophils Relative 0 %   Eosinophils Absolute 0.0 0 - 0 K/uL   Basophils Relative 0 %   Basophils Absolute 0.0 0 - 0 K/uL   Immature Granulocytes 0 %   Abs Immature Granulocytes 0.03 0.00 - 0.07 K/uL    Comment: Performed at [facility], [address] [place], [place] [address]  Glucose, capillary     Status: Abnormal   Collection Time: 08/25/20  7:33 AM  Result Value Ref Range   Glucose-Capillary 139 (H) 70 - 99 mg/dL    Comment: Glucose reference range applies only to samples taken after fasting for at least 8 hours.  Glucose, capillary     Status: Abnormal   Collection Time: 08/25/20 11:15 AM  Result Value Ref Range   Glucose-Capillary 118 (H) 70 - 99 mg/dL  Comment: Glucose reference range applies only to samples taken  after fasting for at least 8 hours.    Recent Results (from the past 240 hour(s))  SARS Coronavirus 2 by RT PCR (hospital order, performed in [facility] hospital lab) Nasopharyngeal Nasopharyngeal Swab     Status: None   Collection Time: 08/19/20 12:54 PM   Specimen: Nasopharyngeal Swab  Result Value Ref Range Status   SARS Coronavirus 2 NEGATIVE NEGATIVE Final    Comment: (NOTE) SARS-CoV-2 target nucleic acids are NOT DETECTED.  The SARS-CoV-2 RNA is generally detectable in upper and lower respiratory specimens during the acute phase of infection. The lowest concentration of SARS-CoV-2 viral copies this assay can detect is 250 copies / mL. A negative result does not preclude SARS-CoV-2 infection and should not be used as the sole basis for treatment or other patient management decisions.  A negative result may occur with improper specimen collection / handling, submission of specimen other than nasopharyngeal swab, presence of viral mutation(s) within the areas targeted by this assay, and inadequate number of viral copies (<250 copies / mL). A negative result must be combined with clinical observations, patient history, and epidemiological information.  Fact Sheet for Patients:   StrictlyIdeas.no  Fact Sheet for Healthcare Providers: BankingDealers.co.za  This test is not yet approved or  cleared by the [place] FDA and has been authorized for detection and/or diagnosis of SARS-CoV-2 by FDA under an Emergency Use Authorization (EUA).  This EUA will remain in effect (meaning this test can be used) for the duration of the COVID-19 declaration under Section 564(b)(1) of the Act, 21 U.S.C. section 360bbb-3(b)(1), unless the authorization is terminated or revoked sooner.  Performed at [facility], [place] [address] [place], [place] [address]     Lipid Panel No results for input(s): CHOL, TRIG, HDL, CHOLHDL, VLDL, LDLCALC  in the last 72 hours.  Studies/Results: No results found.  Medications:  Scheduled: . chlorhexidine  15 mL Mouth Rinse BID  . Chlorhexidine Gluconate Cloth  6 each Topical Daily  . feeding supplement (PROSource TF)  45 mL Per Tube BID  . furosemide  20 mg Per Tube BID  . lacosamide  100 mg Per Tube BID  . levETIRAcetam  1,000 mg Per Tube BID  . levothyroxine  75 mcg Per Tube Q0600  . mouth rinse  15 mL Mouth Rinse q12n4p  . metoprolol tartrate  50 mg Per Tube BID  . perampanel  4 mg Per Tube QHS  . [START ON 08/26/2020] PHENObarbital  60 mg Per Tube QHS  . phenytoin  125 mg Per Tube TID  . potassium & sodium phosphates  1 packet Per Tube TID WC & HS   Continuous: . feeding supplement (OSMOLITE 1.2 CAL) 55 mL/hr at 08/25/20 1200    LTM EEG report for this AM:  ABNORMALITY -PLEDs, left hemisphere -Continuous slow, generalized and maximal left parieto-occipital region IMPRESSION: This studyshowed evidence of epileptogenicity and cortical dysfunction in left hemisphere, maximal left parieto-occipital region secondary to underlying structural abnormality. No seizures were seen throughout the study. EEG appears to be improving compared to previous day.    Assessment: 86 year-old female with recent left temporal hemorrhage (07/22/2020) and associated seizures who presented withfocal motor status epilepticus, now resolved, but with continued  evidence of epileptogenicity and cortical dysfunction in the left hemisphere. -- Lesional epilepsy with breakthrough seizures -- Recent left temporal ICH (July 2021) -- Acute encephalopathy, due to seizures and medications -- Patient presented on  250 mg twice daily Keppra. Gradually we increased Keppra to 1000 mg twice daily(renally dosed), added Dilantin, perampanel, phenobarb and Vimpat.  No further seizures overnight or during day shift.  Recommendations: - Continue AEDs as follows 1.Keppra 1000 mg twice daily 2.Phenytoin 125 mg 3  times daily 3.Vimpat 100 mg twice daily 4.Phenobarb 65 mg nightly 5.  Perampanel 4 mg nightly - If seizures recur, can increase phenobarb to 65 mg twice daily - As needed IV Ativan 2 mg for clinical seizure-like activity. Please be cautious when administering Ativan to minimize excessive sedation - She has remained seizure-free. Can be moved to progressive care if CCM team agrees.  - We will consider reducing AEDs after patient has been seizure-free for a few days. - Management of rest of comorbidities per primary team  35 minutes spent in the neurological evaluation and management of this critically ill patient.    LOS: 6 days   @Electronically  signed: Dr. Kerney Elbe 08/25/2020  12:55 PM

## 2020-08-25 NOTE — Consult Note (Addendum)
Palliative Medicine Inpatient Consult Note  Reason for consult:  [place] discussion  HPI:  Per intake H&P -- "Lori Bridges is a 86 y.o. female with medical history significant of paroxysmal A. fib, chronic diastolic CHF, hypertension, hyperlipidemia, GERD, recent ICH, degenerative disc disease, CKD stage IIIa presents to emergency department with right-sided seizure-like activity.  Patient's daughter at the bedside is the historian.  She tells me that patient recently hospitalized due to [place] due to uncontrolled hypertension and right-sided  seizure-like activity on 7/29 and discharged home on 8/19 from rehab in stable condition on Keppra 250 mg twice daily.  EEG shows epileptogenicity in the left frontal lobe but no seizure activity.  Her Coumadin was discontinued. patient was doing fine after the discharge however yesterday she started feeling dizzy and complaining of involuntary movements of right arm and leg and unable to bear weight on right leg.  Her symptoms started getting worse therefore family brought patient to the emergency department for further evaluation and management.  Patient was able to ambulate after the discharge from the hospital with the help of walker however since yesterday she was not able to walk due to dizziness, some confusion and right-sided seizure-like activity.  Her last dose of Keppra was yesterday morning.  Has chronic bilateral lower leg swelling however no history of orthopnea, PND no history of headache, blurry vision, head trauma, chest pain, shortness of breath, fever, chills, palpitation, nausea, vomiting, diarrhea, urinary changes.  Upon my evaluation: Patient sleeping comfortably on the bed.  On room air, not in acute distress.  No involuntary body movements noted on exam.  No history of smoking, alcohol, illicit drug use."  She has had AEDs added until she is on 5 medications to suppress her seizure activity. She has been in ICU since 8/26. There has  been concern for potential need to intubate due to sedation from medications.   Clinical Assessment/Goals of Care: I have reviewed medical records including EPIC notes, labs and imaging, received report from bedside RN, assessed the patient.    I met with daughter Lori Bridges to further discuss diagnosis prognosis, [place], EOL wishes, disposition and options.   I introduced Palliative Medicine as specialized medical care for people living with serious illness. It focuses on providing relief from the symptoms and stress of a serious illness. The goal is to improve quality of life for both the patient and the family.  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 She has 6 daughters and 1 son who all live local.  She retired about 14 years ago after her Bridges died. She was discharged from the hospital following intracerebral hemorrhage on  7/29 to rehab and discharged from rehab on 8/19. Previously she had lived at home taking care of herself but a nephew lived in the home with her. When she was discharged from rehab she went to her daughter Lori Bridges [phone]) home where she was able to walk with a walker until she had twitching of her leg and then arms and ws brought to the ER. Lori Bridges expresses that the family has not had the ability to talk about goals because of the death of Lori Bridges the same day Lori Bridges came to the ER.  Discussed the importance of continued conversation with family and their medical providers regarding overall plan of care and treatment options, ensuring decisions are within the context of the patient's values and GOCs.   Decision Maker: Lori Bridges, daughter  SUMMARY  OF RECOMMENDATIONS    Code Status/Advance Care Planning: FULL CODE currently  Symptom Management:   Nausea- Odansetron  Pain- Acetaminophen  Palliative Prophylaxis:   Oral care, Turn q 2, SCDs  PT/OT  GERD- Pantoprazole  Psycho-social/Spiritual:   Desire for  further Chaplaincy support: No per family, she is Jehovah witness  Additional Recommendations: No blood products  Palliative care team will follow to have conversation with POA when available.   Prognosis: Poor with 5 agents required to suppress seizure activity. Concern for potential need for airway management.   Discharge Planning: To be determined    Vitals with BMI 08/25/2020 08/25/2020 08/25/2020  Height - - -  Weight - - -  BMI - - -  Systolic 89 638 756  Diastolic 64 64 72  Pulse 98 96 112    PPS:10%   This conversation/these recommendations were discussed with patient primary care team, Dr. Lynetta Mare  Thank you for the opportunity to participate in the care of this patient and family.   Time In: 8:40 Time Out:9:30 Total Time: 50 minutes Greater than 50%  of this time was spent counseling and coordinating care related to the above assessment and plan.  [facility] Palliative Medicine Team Team Cell Phone: [phone] Please utilize secure chat with additional questions, if there is no response within 30 minutes please call the above phone number  Palliative Medicine Team providers are available by phone from 7am to 7pm daily and can be reached through the team cell phone.  Should this patient require assistance outside of these hours, please call the patient's attending physician.

## 2020-08-25 NOTE — Progress Notes (Signed)
NAME:  Lori Bridges, MRN:  [id number], DOB:  11-05-1930, LOS: 6 ADMISSION DATE:  08/18/2020, CONSULTATION DATE: 08/23/2020 REFERRING MD: Neurology, CHIEF COMPLAINT: Decreased level of consciousness  Brief History   86 year old with uncontrolled seizures  History of present illness   86 year old female with a history of uncontrolled hypertension who several months ago had an intracerebral hemorrhage requiring hospitalization and subsequent rehabilitation.  She was discharged home utilizing a walker but I was able to get around.  She is brought back to the hospital for decreased level consciousness and shaking movement of the right arm.  Neurology has been consulted in the morning giving increasing doses of antiseizure medication which is now causing the lethargy.  There is a concern for airway compromise therefore she will be transferred to intensive care unit for closer monitoring.  There has been discussion of intubation if needed although there are ongoing discussions with family about for T of this minute 86 year old with multiple health issues.  Past Medical History   Past Medical History:  Diagnosis Date  . Adenomatous colon polyp 2005  . Anxiety age 35  . Carotid artery bruit 2001   left  . CKD (chronic kidney disease), stage III   . DDD (degenerative disc disease)   . DJD (degenerative joint disease)   . Gastroenteritis 2011  . GERD (gastroesophageal reflux disease) 2003  . History of pneumonia   . HTN (hypertension) 1983  . Hyperlipidemia 2000  . Hypothyroidism 2009  . Iron deficiency anemia 1983  . Osteoarthritis 2001  . Osteoporosis 2001  . Permanent atrial fibrillation ([facility]) 07/23/2010       . Persistent headaches 1987  . Pulmonary HTN ([place]) 04/03/2017   Moderate with PASP 15mmHg by echo 02/2017  . Tricuspid regurgitation    severe by echo 02/25/12  . Vertigo   . Vitamin D deficiency 2011     Significant Hospital Events   Transferred to the intensive care unit  08/23/2020  Consults:  Neurology Pulmonary critical care  Procedures:  Continuous EEG  Significant Diagnostic Tests:  LTM shows continual epileptiform discharges   Micro Data:  SARS CoV2 - negative.  Antimicrobials:  none  Interim history/subjective:  She has not required intubation.  No further seizure activity is evident on examination at this time. Still minimally responsive. Groans to name and moves her feet.   Objective   Blood pressure 90/64, pulse (!) 109, temperature (!) 97.1 F (36.2 C), temperature source Axillary, resp. rate 19, height 5\' 7"  (1.702 m), weight 87.6 kg, SpO2 94 %.        Intake/Output Summary (Last 24 hours) at 08/25/2020 0948 Last data filed at 08/25/2020 0900 Gross per 24 hour  Intake 1495.5 ml  Output 350 ml  Net 1145.5 ml   Filed Weights   08/22/20 0321 08/23/20 0320 08/25/20 0500  Weight: 90.2 kg 93.3 kg 87.6 kg    Examination: General: 86 year old female who is minimally responsive HEENT: No JVD or lymphadenopathy is appreciated Neuro: Grunts in acknowledgment of questions CV: Heart sounds 3/6 SEM at LSB. PULM: 2 L nasal cannula chest clear GI: soft, bsx4 active - no DM Extremities: warm/dry, 1+ edema  Skin: no rashes or lesions   Resolved Hospital Problem list     Assessment & Plan:   Critically ill decreased level of consciousness secondary to analeptics attempting to control seizure activity. History of intracerebral hemorrhage Neurology is following EEG monitoring ongoing Not currently seizing  Suspect prolonged post-ictal state or oversedation from anticonvulsants. -  If remains sedated with similar EEG tomorrow we will begin cautiously decreasing antiseizure medications. -convert all anticonvulsants to enteral.   Atrial fibrillation with adequate rate control. -We will convert to oral metoprolol   Best practice:  Diet: NPO - will place feeding tube as at high nutritional risk Pain/Anxiety/Delirium protocol (if  indicated): Not indicated VAP protocol (if indicated): Not indicated DVT prophylaxis: Sequential compression hose GI prophylaxis: PPI Glucose control: Sliding scale Mobility: Bedrest Code Status: Full Family Communication: Daughter updated at bedside 08/25/2020 Disposition: Transfer to PCU once more awake.  Labs   CBC: Recent Labs  Lab 08/18/20 1746 08/19/20 1444 08/20/20 0301 08/25/20 0350  WBC 4.1 4.8 3.3* 6.8  NEUTROABS  --  3.0  --  5.4  HGB 12.7 13.6 12.1 13.1  HCT 41.3 44.5 38.8 41.5  MCV 97.4 99.6 97.2 96.3  PLT 189 157 160 454    Basic Metabolic Panel: Recent Labs  Lab 08/19/20 1444 08/20/20 0301 08/21/20 0749 08/22/20 1550 08/24/20 1224 08/24/20 1754 08/25/20 0350  NA 141 143 141 139  --   --  140  K 4.0 4.0 3.5 4.1  --   --  3.8  CL 106 106 104 105  --   --  107  CO2 23 25 27 24   --   --  26  GLUCOSE 76 66* 84 104*  --   --  140*  BUN 17 17 17 18   --   --  23  CREATININE 1.10* 1.12* 1.13* 1.00  --   --  0.85  CALCIUM 10.2 10.1 10.0 9.6  --   --  9.6  MG 2.2  --  2.0  --  2.1 2.0 2.1  PHOS 3.5  --   --   --  3.1 2.9 2.1*   GFR: Estimated Creatinine Clearance: 52 mL/min (by C-G formula based on SCr of 0.85 mg/dL). Recent Labs  Lab 08/18/20 1746 08/19/20 1444 08/20/20 0301 08/25/20 0350  WBC 4.1 4.8 3.3* 6.8    Liver Function Tests: Recent Labs  Lab 08/18/20 1746 08/19/20 1444 08/20/20 0301  AST 33 30 27  ALT 30 27 23   ALKPHOS 191* 175* 153*  BILITOT 1.5* 2.0* 2.0*  PROT 6.5 6.3* 5.8*  ALBUMIN 3.2* 3.2* 2.8*   Recent Labs  Lab 08/18/20 1746  LIPASE 91*   Recent Labs  Lab 08/19/20 1434  AMMONIA 25    ABG    Component Value Date/Time   TCO2 28 04/03/2012 2232     Coagulation Profile: Recent Labs  Lab 08/19/20 1444  INR 1.2    Cardiac Enzymes: No results for input(s): CKTOTAL, CKMB, CKMBINDEX, TROPONINI in the last 168 hours.  HbA1C: Hgb A1c MFr Bld  Date/Time Value Ref Range Status  07/23/2020 02:43 AM 5.7 (H)  4.8 - 5.6 % Final    Comment:    (NOTE) Pre diabetes:          5.7%-6.4%  Diabetes:              >6.4%  Glycemic control for   <7.0% adults with diabetes     CBG: Recent Labs  Lab 08/22/20 1807 08/24/20 1902 08/24/20 2258 08/25/20 0305 08/25/20 0733  GLUCAP 126* 159* 134* 141* 139*   CRITICAL CARE Performed by: Kipp Brood   Total critical care time: 40 minutes  Critical care time was exclusive of separately billable procedures and treating other patients.  Critical care was necessary to treat or prevent imminent or life-threatening deterioration.  Critical  care was time spent personally by me on the following activities: development of treatment plan with patient and/or surrogate as well as nursing, discussions with consultants, evaluation of patient's response to treatment, examination of patient, obtaining history from patient or surrogate, ordering and performing treatments and interventions, ordering and review of laboratory studies, ordering and review of radiographic studies, pulse oximetry, re-evaluation of patient's condition and participation in multidisciplinary rounds.  Kipp Brood, MD [facility] ICU Physician [place]  Pager: [phone] Mobile: [phone] After hours: [phone].   08/25/2020, 9:48 AM

## 2020-08-25 NOTE — Progress Notes (Signed)
EEG maint complete. Skin breakdown noted on forehead. Moved leads 1 cm up continue to monitor

## 2020-08-26 ENCOUNTER — Inpatient Hospital Stay (HOSPITAL_COMMUNITY): Payer: Medicare Other

## 2020-08-26 DIAGNOSIS — L8990 Pressure ulcer of unspecified site, unspecified stage: Secondary | ICD-10-CM | POA: Insufficient documentation

## 2020-08-26 LAB — CBC WITH DIFFERENTIAL/PLATELET
Abs Immature Granulocytes: 0.04 10*3/uL (ref 0.00–0.07)
Basophils Absolute: 0 10*3/uL (ref 0.0–0.1)
Basophils Relative: 0 %
Eosinophils Absolute: 0.1 10*3/uL (ref 0.0–0.5)
Eosinophils Relative: 1 %
HCT: 41.1 % (ref 36.0–46.0)
Hemoglobin: 13.2 g/dL (ref 12.0–15.0)
Immature Granulocytes: 1 %
Lymphocytes Relative: 10 %
Lymphs Abs: 0.7 10*3/uL (ref 0.7–4.0)
MCH: 31 pg (ref 26.0–34.0)
MCHC: 32.1 g/dL (ref 30.0–36.0)
MCV: 96.5 fL (ref 80.0–100.0)
Monocytes Absolute: 0.9 10*3/uL (ref 0.1–1.0)
Monocytes Relative: 14 %
Neutro Abs: 4.9 10*3/uL (ref 1.7–7.7)
Neutrophils Relative %: 74 %
Platelets: 199 10*3/uL (ref 150–400)
RBC: 4.26 MIL/uL (ref 3.87–5.11)
RDW: 17.2 % — ABNORMAL HIGH (ref 11.5–15.5)
WBC: 6.6 10*3/uL (ref 4.0–10.5)
nRBC: 0 % (ref 0.0–0.2)

## 2020-08-26 LAB — GLUCOSE, CAPILLARY
Glucose-Capillary: 100 mg/dL — ABNORMAL HIGH (ref 70–99)
Glucose-Capillary: 123 mg/dL — ABNORMAL HIGH (ref 70–99)
Glucose-Capillary: 107 mg/dL — ABNORMAL HIGH (ref 70–99)
Glucose-Capillary: 135 mg/dL — ABNORMAL HIGH (ref 70–99)
Glucose-Capillary: 135 mg/dL — ABNORMAL HIGH (ref 70–99)

## 2020-08-26 LAB — BASIC METABOLIC PANEL
Anion gap: 5 (ref 5–15)
BUN: 26 mg/dL — ABNORMAL HIGH (ref 8–23)
CO2: 26 mmol/L (ref 22–32)
Calcium: 9.5 mg/dL (ref 8.9–10.3)
Chloride: 107 mmol/L (ref 98–111)
Creatinine, Ser: 0.82 mg/dL (ref 0.44–1.00)
GFR calc Af Amer: >60 mL/min (ref >60)
GFR calc non Af Amer: >60 mL/min (ref >60)
Glucose, Bld: 96 mg/dL (ref 70–99)
Potassium: 4.3 mmol/L (ref 3.5–5.1)
Sodium: 138 mmol/L (ref 135–145)

## 2020-08-26 MED ORDER — DILTIAZEM HCL-DEXTROSE 125-5 MG/125ML-% IV SOLN (PREMIX)
5.0000 mg/h | INTRAVENOUS | Status: DC
  Administered 2020-08-26 – 2020-08-27 (×2): 10 mg/h via INTRAVENOUS
  Filled 2020-08-26 (×2): qty 125

## 2020-08-26 MED ORDER — METOPROLOL TARTRATE 5 MG/5ML IV SOLN
5.0000 mg | Freq: Once | INTRAVENOUS | Status: AC
  Administered 2020-08-26: 5 mg via INTRAVENOUS
  Filled 2020-08-26: qty 5

## 2020-08-26 NOTE — Progress Notes (Signed)
NAME:  Lori Bridges, MRN:  [id number], DOB:  1930/08/18, LOS: 7 ADMISSION DATE:  08/18/2020, CONSULTATION DATE: 08/23/2020 REFERRING MD: Neurology, CHIEF COMPLAINT: Decreased level of consciousness  Brief History   86 year old with uncontrolled seizures  History of present illness   86 year old female with a history of uncontrolled hypertension who several months ago had an intracerebral hemorrhage requiring hospitalization and subsequent rehabilitation.  She was discharged home utilizing a walker but I was able to get around.  She is brought back to the hospital for decreased level consciousness and shaking movement of the right arm.  Neurology has been consulted in the morning giving increasing doses of antiseizure medication which is now causing the lethargy.  There is a concern for airway compromise therefore she will be transferred to intensive care unit for closer monitoring.  There has been discussion of intubation if needed although there are ongoing discussions with family about for T of this minute 86 year old with multiple health issues.  Past Medical History   Past Medical History:  Diagnosis Date  . Adenomatous colon polyp 2005  . Anxiety age 85  . Carotid artery bruit 2001   left  . CKD (chronic kidney disease), stage III   . DDD (degenerative disc disease)   . DJD (degenerative joint disease)   . Gastroenteritis 2011  . GERD (gastroesophageal reflux disease) 2003  . History of pneumonia   . HTN (hypertension) 1983  . Hyperlipidemia 2000  . Hypothyroidism 2009  . Iron deficiency anemia 1983  . Osteoarthritis 2001  . Osteoporosis 2001  . Permanent atrial fibrillation ([place]) 07/23/2010       . Persistent headaches 1987  . Pulmonary HTN ([place]) 04/03/2017   Moderate with PASP 63mmHg by echo 02/2017  . Tricuspid regurgitation    severe by echo 02/25/12  . Vertigo   . Vitamin D deficiency 2011    Significant Hospital Events   Transferred to the intensive care unit  08/23/2020  Consults:  Neurology Pulmonary critical care  Procedures:  Continuous EEG  Significant Diagnostic Tests:  LTM shows continual epileptiform discharges   Micro Data:  SARS CoV2 - negative.  Antimicrobials:  none  Interim history/subjective:  Lying in bed deeply sedated Concern for ability to protect airway given sedation and tachypnea  No seizures on LTM  Objective   Blood pressure 112/77, pulse (!) 109, temperature 98.7 F (37.1 C), temperature source Axillary, resp. rate (!) 27, height 5\' 7"  (1.702 m), weight 87.4 kg, SpO2 91 %.        Intake/Output Summary (Last 24 hours) at 08/26/2020 1153 Last data filed at 08/26/2020 0600 Gross per 24 hour  Intake 1545.58 ml  Output 300 ml  Net 1245.58 ml   Filed Weights   08/23/20 0320 08/25/20 0500 08/26/20 0500  Weight: 93.3 kg 87.6 kg 87.4 kg    Examination: General: Chronically ill appearing frial appearing elderly female lying in bed in NAD HEENT: [place]/AT MM pink/moist, PERRL,  Neuro: Sedated on LTM, unable to follow commands  CV: s1s2 regular rate and rhythm, no murmur, rubs, or gallops,  PULM:  Clear to ascultation bilaterally, mild tachypnea, oxygen saturations 93-96 on 2L [place] GI: soft, bowel sounds active in all 4 quadrants, non-tender, non-distended, Extremities: warm/dry, 2+pitting upper and lower extremity edema  Skin: no rashes or lesions  Resolved Hospital Problem list     Assessment & Plan:   Critically ill decreased level of consciousness secondary to analeptics attempting to control seizure activity. History of intracerebral hemorrhage -  Patient remains deeply sedated due to recurrent seizures, she is unable to follow any commands and she is mildly tachypnic P: Neurology following, appreciate assistance Remains at risk for intubation for airway protection due to sedation  Daughter at bedside would want "everything done" including intubation but family would NOT want trach  Continue  antiepileptics per neurology  Ativan as needed for seizures  Seizure precautions  LTM in place  Defer to neurology for timing of lightning of sedation   Atrial fibrillation with adequate rate control. -Home medications include lopressor -Previously on coumadin but this has remained on hold since  Development of intraparenchymal hemorrhage  P: Continue oral beta blocker  Continuous telemetry   Hypothyroidism  P: Continue Synthroid   Best practice:  Diet: NPO - will place feeding tube as at high nutritional risk Pain/Anxiety/Delirium protocol (if indicated): Not indicated VAP protocol (if indicated): Not indicated DVT prophylaxis: Sequential compression hose GI prophylaxis: PPI Glucose control: Sliding scale Mobility: Bedrest Code Status: Full Family Communication: Daughter updated at bedside 08/25/2020 Disposition: Transfer to PCU once more awake.  Labs   CBC: Recent Labs  Lab 08/19/20 1444 08/20/20 0301 08/25/20 0350 08/26/20 0343  WBC 4.8 3.3* 6.8 6.6  NEUTROABS 3.0  --  5.4 4.9  HGB 13.6 12.1 13.1 13.2  HCT 44.5 38.8 41.5 41.1  MCV 99.6 97.2 96.3 96.5  PLT 157 160 166 956    Basic Metabolic Panel: Recent Labs  Lab 08/19/20 1444 08/19/20 1444 08/20/20 0301 08/21/20 0749 08/22/20 1550 08/24/20 1224 08/24/20 1754 08/25/20 0350 08/25/20 1708 08/26/20 0343  NA 141   < > 143 141 139  --   --  140  --  138  K 4.0   < > 4.0 3.5 4.1  --   --  3.8  --  4.3  CL 106   < > 106 104 105  --   --  107  --  107  CO2 23   < > 25 27 24   --   --  26  --  26  GLUCOSE 76   < > 66* 84 104*  --   --  140*  --  96  BUN 17   < > 17 17 18   --   --  23  --  26*  CREATININE 1.10*   < > 1.12* 1.13* 1.00  --   --  0.85  --  0.82  CALCIUM 10.2   < > 10.1 10.0 9.6  --   --  9.6  --  9.5  MG 2.2   < >  --  2.0  --  2.1 2.0 2.1 2.1  --   PHOS 3.5  --   --   --   --  3.1 2.9 2.1* 2.0*  --    < > = values in this interval not displayed.   GFR: Estimated Creatinine Clearance: 53.8  mL/min (by C-G formula based on SCr of 0.82 mg/dL). Recent Labs  Lab 08/19/20 1444 08/20/20 0301 08/25/20 0350 08/26/20 0343  WBC 4.8 3.3* 6.8 6.6    Liver Function Tests: Recent Labs  Lab 08/19/20 1444 08/20/20 0301  AST 30 27  ALT 27 23  ALKPHOS 175* 153*  BILITOT 2.0* 2.0*  PROT 6.3* 5.8*  ALBUMIN 3.2* 2.8*   No results for input(s): LIPASE, AMYLASE in the last 168 hours. Recent Labs  Lab 08/19/20 1434  AMMONIA 25    ABG    Component Value Date/Time   TCO2  28 04/03/2012 2232     Coagulation Profile: Recent Labs  Lab 08/19/20 1444  INR 1.2    Cardiac Enzymes: No results for input(s): CKTOTAL, CKMB, CKMBINDEX, TROPONINI in the last 168 hours.  HbA1C: Hgb A1c MFr Bld  Date/Time Value Ref Range Status  07/23/2020 02:43 AM 5.7 (H) 4.8 - 5.6 % Final    Comment:    (NOTE) Pre diabetes:          5.7%-6.4%  Diabetes:              >6.4%  Glycemic control for   <7.0% adults with diabetes     CBG: Recent Labs  Lab 08/25/20 1530 08/25/20 1917 08/25/20 2302 08/26/20 0320 08/26/20 0742  GLUCAP 95 125* 139* 100* 123*   CRITICAL CARE Performed by: Johnsie Cancel   Total critical care time: 40 minutes  Critical care time was exclusive of separately billable procedures and treating other patients.  Critical care was necessary to treat or prevent imminent or life-threatening deterioration.  Critical care was time spent personally by me on the following activities: development of treatment plan with patient and/or surrogate as well as nursing, discussions with consultants, evaluation of patient's response to treatment, examination of patient, obtaining history from patient or surrogate, ordering and performing treatments and interventions, ordering and review of laboratory studies, ordering and review of radiographic studies, pulse oximetry, re-evaluation of patient's condition and participation in multidisciplinary rounds.  Kipp Brood, MD  [facility] ICU Physician [facility]  Pager: [phone] Mobile: [phone] After hours: [phone].   08/26/2020, 11:53 AM

## 2020-08-26 NOTE — Plan of Care (Signed)
  Problem: Clinical Measurements: Goal: Ability to maintain clinical measurements within normal limits will improve Outcome: Progressing   

## 2020-08-26 NOTE — Progress Notes (Signed)
maint complete.  

## 2020-08-26 NOTE — Progress Notes (Addendum)
Subjective: Patient is sleeping comfortably. Daughter at bedside.  LTM on, no szs overnight.  Objective: Current vital signs: BP 103/66   Pulse (!) 118   Temp 98.9 F (37.2 C) (Axillary)   Resp 19   Ht 5\' 7"  (1.702 m)   Wt 87.4 kg   SpO2 95%   BMI 30.18 kg/m  Vital signs in last 24 hours: Temp:  [96.8 F (36 C)-98.9 F (37.2 C)] 98.9 F (37.2 C) (08/29 0400) Pulse Rate:  [38-140] 118 (08/29 0600) Resp:  [16-27] 19 (08/29 0600) BP: (89-124)/(55-87) 103/66 (08/29 0600) SpO2:  [93 %-97 %] 95 % (08/29 0600) Weight:  [87.4 kg] 87.4 kg (08/29 0500)  Intake/Output from previous day: 08/28 0701 - 08/29 0700 In: 2023.6 [NG/GT:1320; IV Piggyback:703.6] Out: 300 [Urine:300] Intake/Output this shift: No intake/output data recorded. Nutritional status:  Diet Order            Diet NPO time specified  Diet effective now                 Neurologic Exam: Ment: Drowsy. Opens eyes to pain. Not following commands. No attempts to communicate. Nonverbal.  CN: Barely opens eyes to pain. PERRL.  Motor/Sensory: Withdraws BUE and BLE weakly to pain.   Lab Results: Results for orders placed or performed during the hospital encounter of 08/18/20 (from the past 48 hour(s))  Magnesium     Status: None   Collection Time: 08/24/20 12:24 PM  Result Value Ref Range   Magnesium 2.1 1.7 - 2.4 mg/dL    Comment: Performed at [facility], [place] [address], [place], [place] [address]  Phosphorus     Status: None   Collection Time: 08/24/20 12:24 PM  Result Value Ref Range   Phosphorus 3.1 2.5 - 4.6 mg/dL    Comment: Performed at [facility], [place] [address], [place], [place] [address]  Magnesium     Status: None   Collection Time: 08/24/20  5:54 PM  Result Value Ref Range   Magnesium 2.0 1.7 - 2.4 mg/dL    Comment: Performed at [facility], [place] [address], [place], [place] [address]  Phosphorus     Status: None   Collection Time: 08/24/20  5:54 PM  Result Value Ref Range    Phosphorus 2.9 2.5 - 4.6 mg/dL    Comment: Performed at [facility] [address] [place], [place] [address]  Glucose, capillary     Status: Abnormal   Collection Time: 08/24/20  7:02 PM  Result Value Ref Range   Glucose-Capillary 159 (H) 70 - 99 mg/dL    Comment: Glucose reference range applies only to samples taken after fasting for at least 8 hours.  Glucose, capillary     Status: Abnormal   Collection Time: 08/24/20 10:58 PM  Result Value Ref Range   Glucose-Capillary 134 (H) 70 - 99 mg/dL    Comment: Glucose reference range applies only to samples taken after fasting for at least 8 hours.  Glucose, capillary     Status: Abnormal   Collection Time: 08/25/20  3:05 AM  Result Value Ref Range   Glucose-Capillary 141 (H) 70 - 99 mg/dL    Comment: Glucose reference range applies only to samples taken after fasting for at least 8 hours.  Magnesium     Status: None   Collection Time: 08/25/20  3:50 AM  Result Value Ref Range   Magnesium 2.1 1.7 - 2.4 mg/dL    Comment:  Performed at [facility], [place] [address], [place], [place] [address]  Phosphorus     Status: Abnormal   Collection Time: 08/25/20  3:50 AM  Result Value Ref Range   Phosphorus 2.1 (L) 2.5 - 4.6 mg/dL    Comment: Performed at [facility] [address], [place], [place] [address]  Basic metabolic panel     Status: Abnormal   Collection Time: 08/25/20  3:50 AM  Result Value Ref Range   Sodium 140 135 - 145 mmol/L   Potassium 3.8 3.5 - 5.1 mmol/L   Chloride 107 98 - 111 mmol/L   CO2 26 22 - 32 mmol/L   Glucose, Bld 140 (H) 70 - 99 mg/dL    Comment: Glucose reference range applies only to samples taken after fasting for at least 8 hours.   BUN 23 8 - 23 mg/dL   Creatinine, Ser 0.85 0.44 - 1.00 mg/dL   Calcium 9.6 8.9 - 10.3 mg/dL   GFR calc non Af Amer >60 >60 mL/min   GFR calc Af Amer >60 >60 mL/min   Anion gap 7 5 - 15    Comment: Performed at [facility] [address]  [place], [place] [address]  CBC with Differential/Platelet     Status: Abnormal   Collection Time: 08/25/20  3:50 AM  Result Value Ref Range   WBC 6.8 4.0 - 10.5 K/uL   RBC 4.31 3.87 - 5.11 MIL/uL   Hemoglobin 13.1 12.0 - 15.0 g/dL   HCT 41.5 36 - 46 %   MCV 96.3 80.0 - 100.0 fL   MCH 30.4 26.0 - 34.0 pg   MCHC 31.6 30.0 - 36.0 g/dL   RDW 16.8 (H) 11.5 - 15.5 %   Platelets 166 150 - 400 K/uL   nRBC 0.0 0.0 - 0.2 %   Neutrophils Relative % 81 %   Neutro Abs 5.4 1.7 - 7.7 K/uL   Lymphocytes Relative 7 %   Lymphs Abs 0.5 (L) 0.7 - 4.0 K/uL   Monocytes Relative 12 %   Monocytes Absolute 0.8 0 - 1 K/uL   Eosinophils Relative 0 %   Eosinophils Absolute 0.0 0 - 0 K/uL   Basophils Relative 0 %   Basophils Absolute 0.0 0 - 0 K/uL   Immature Granulocytes 0 %   Abs Immature Granulocytes 0.03 0.00 - 0.07 K/uL    Comment: Performed at [facility], [address], [place], [place] [address]  Glucose, capillary     Status: Abnormal   Collection Time: 08/25/20  7:33 AM  Result Value Ref Range   Glucose-Capillary 139 (H) 70 - 99 mg/dL    Comment: Glucose reference range applies only to samples taken after fasting for at least 8 hours.  Glucose, capillary     Status: Abnormal   Collection Time: 08/25/20 11:15 AM  Result Value Ref Range   Glucose-Capillary 118 (H) 70 - 99 mg/dL    Comment: Glucose reference range applies only to samples taken after fasting for at least 8 hours.  Glucose, capillary     Status: None   Collection Time: 08/25/20  3:30 PM  Result Value Ref Range   Glucose-Capillary 95 70 - 99 mg/dL    Comment: Glucose reference range applies only to samples taken after fasting for at least 8 hours.  Magnesium     Status: None   Collection Time: 08/25/20  5:08 PM  Result Value Ref Range   Magnesium  2.1 1.7 - 2.4 mg/dL    Comment: Performed at [facility], [place] [address] [place], [place] [address]  Phosphorus     Status: Abnormal   Collection Time: 08/25/20  5:08 PM   Result Value Ref Range   Phosphorus 2.0 (L) 2.5 - 4.6 mg/dL    Comment: Performed at [facility] [address] [place], [place] [address]  Glucose, capillary     Status: Abnormal   Collection Time: 08/25/20  7:17 PM  Result Value Ref Range   Glucose-Capillary 125 (H) 70 - 99 mg/dL    Comment: Glucose reference range applies only to samples taken after fasting for at least 8 hours.  Glucose, capillary     Status: Abnormal   Collection Time: 08/25/20 11:02 PM  Result Value Ref Range   Glucose-Capillary 139 (H) 70 - 99 mg/dL    Comment: Glucose reference range applies only to samples taken after fasting for at least 8 hours.  Glucose, capillary     Status: Abnormal   Collection Time: 08/26/20  3:20 AM  Result Value Ref Range   Glucose-Capillary 100 (H) 70 - 99 mg/dL    Comment: Glucose reference range applies only to samples taken after fasting for at least 8 hours.  Basic metabolic panel     Status: Abnormal   Collection Time: 08/26/20  3:43 AM  Result Value Ref Range   Sodium 138 135 - 145 mmol/L   Potassium 4.3 3.5 - 5.1 mmol/L   Chloride 107 98 - 111 mmol/L   CO2 26 22 - 32 mmol/L   Glucose, Bld 96 70 - 99 mg/dL    Comment: Glucose reference range applies only to samples taken after fasting for at least 8 hours.   BUN 26 (H) 8 - 23 mg/dL   Creatinine, Ser 0.82 0.44 - 1.00 mg/dL   Calcium 9.5 8.9 - 10.3 mg/dL   GFR calc non Af Amer >60 >60 mL/min   GFR calc Af Amer >60 >60 mL/min   Anion gap 5 5 - 15    Comment: Performed at [facility] [address], [place], [place] [address]  CBC with Differential/Platelet     Status: Abnormal   Collection Time: 08/26/20  3:43 AM  Result Value Ref Range   WBC 6.6 4.0 - 10.5 K/uL   RBC 4.26 3.87 - 5.11 MIL/uL   Hemoglobin 13.2 12.0 - 15.0 g/dL   HCT 41.1 36 - 46 %   MCV 96.5 80.0 - 100.0 fL   MCH 31.0 26.0 - 34.0 pg   MCHC 32.1 30.0 - 36.0 g/dL   RDW 17.2 (H) 11.5 - 15.5 %   Platelets 199 150 - 400 K/uL   nRBC 0.0  0.0 - 0.2 %   Neutrophils Relative % 74 %   Neutro Abs 4.9 1.7 - 7.7 K/uL   Lymphocytes Relative 10 %   Lymphs Abs 0.7 0.7 - 4.0 K/uL   Monocytes Relative 14 %   Monocytes Absolute 0.9 0 - 1 K/uL   Eosinophils Relative 1 %   Eosinophils Absolute 0.1 0 - 0 K/uL   Basophils Relative 0 %   Basophils Absolute 0.0 0 - 0 K/uL   Immature Granulocytes 1 %   Abs Immature Granulocytes 0.04 0.00 - 0.07 K/uL    Comment: Performed at [facility] [address], [place], [place] [address]  Glucose, capillary     Status: Abnormal   Collection Time: 08/26/20  7:42 AM  Result Value Ref Range   Glucose-Capillary 123 (H) 70 - 99 mg/dL    Comment: Glucose reference range applies only to samples taken after fasting for at least 8 hours.    Recent Results (from the past 240 hour(s))  SARS Coronavirus 2 by RT PCR (hospital order, performed in [facility] hospital lab) Nasopharyngeal Nasopharyngeal Swab     Status: None   Collection Time: 08/19/20 12:54 PM   Specimen: Nasopharyngeal Swab  Result Value Ref Range Status   SARS Coronavirus 2 NEGATIVE NEGATIVE Final    Comment: (NOTE) SARS-CoV-2 target nucleic acids are NOT DETECTED.  The SARS-CoV-2 RNA is generally detectable in upper and lower respiratory specimens during the acute phase of infection. The lowest concentration of SARS-CoV-2 viral copies this assay can detect is 250 copies / mL. A negative result does not preclude SARS-CoV-2 infection and should not be used as the sole basis for treatment or other patient management decisions.  A negative result may occur with improper specimen collection / handling, submission of specimen other than nasopharyngeal swab, presence of viral mutation(s) within the areas targeted by this assay, and inadequate number of viral copies (<250 copies / mL). A negative result must be combined with clinical observations, patient history, and epidemiological information.  Fact Sheet for Patients:    StrictlyIdeas.no  Fact Sheet for Healthcare Providers: BankingDealers.co.za  This test is not yet approved or  cleared by the [place] FDA and has been authorized for detection and/or diagnosis of SARS-CoV-2 by FDA under an Emergency Use Authorization (EUA).  This EUA will remain in effect (meaning this test can be used) for the duration of the COVID-19 declaration under Section 564(b)(1) of the Act, 21 U.S.C. section 360bbb-3(b)(1), unless the authorization is terminated or revoked sooner.  Performed at [facility], [place] [address], [place], [place] [address]     Lipid Panel No results for input(s): CHOL, TRIG, HDL, CHOLHDL, VLDL, LDLCALC in the last 72 hours.  Studies/Results: DG Chest Port 1 View  Result Date: 08/25/2020 CLINICAL DATA:  Cardiomegaly versus pericardial effusion. EXAM: PORTABLE CHEST 1 VIEW COMPARISON:  August 19, 2020 FINDINGS: The cardiac silhouette remains enlarged and is stable. Layering effusion on the right. Focal opacity in left retrocardiac region, similar in the interval. Feeding tube terminates blows today's film. No pneumothorax. No other acute abnormalities. IMPRESSION: 1. Stable enlargement of the cardiac silhouette may represent cardiomegaly or pericardial effusion. 2. Layering effusion on the right with underlying atelectasis. 3. Dense opacity in left retrocardiac region remains. 4. 2 linear densities project over the left side of the mediastinum, possibly something on the patient. This is a new finding in the interval. Electronically Signed   By: Dorise Bullion III M.D   On: 08/25/2020 15:05    Medications:  Scheduled: . chlorhexidine  15 mL Mouth Rinse BID  . Chlorhexidine Gluconate Cloth  6 each Topical Daily  . feeding supplement (PROSource TF)  45 mL Per Tube BID  . furosemide  20 mg Per Tube BID  . lacosamide  100 mg Per Tube BID  . levETIRAcetam  1,000 mg Per Tube BID  .  levothyroxine  75 mcg Per Tube Q0600  . mouth rinse  15 mL Mouth Rinse q12n4p  . metoprolol tartrate  50 mg Per Tube BID  . perampanel  4 mg Per Tube QHS  . PHENObarbital  60 mg Per Tube QHS  . phenytoin  125 mg Per Tube TID   Continuous: .  feeding supplement (OSMOLITE 1.2 CAL) 55 mL/hr at 08/26/20 0600    Assessment: 86 year-old female with recent left temporal hemorrhage (07/22/2020) and associated seizures who presented withfocal motor status epilepticus, now resolved, but with continued evidence of epileptogenicity and cortical dysfunction in the left hemisphere. -- Lesional epilepsy with breakthrough seizures -- Recent left temporal ICH (July 2021) -- Acute encephalopathy, due to seizures and medications -- Patient presented on 250 mg twice daily Keppra. Gradually we increased Keppra to 1000 mg twice daily(renally dosed), added Dilantin, perampanel, phenobarb and Vimpat.No further seizures overnight or this AM (8/29). -- LTM report for this AM (8/29): Abnormalities: PLEDs, left hemisphere. Continuous slow, generalized and maximal left parieto-occipital region. Impression: This studyshowedevidence of epileptogenicity and cortical dysfunction in left hemisphere, maximal leftparieto-occipital region secondary to underlying structural abnormality. No seizures were seen throughout the study. EEG is stable compared to previous day.  Recommendations: - Continue the following AEDs: 1.Keppra 1000 mg twice daily 2.Vimpat 100 mg twice daily 3.Phenobarb 65 mg nightly 4.Perampanel 4 mg nightly - Discontinue the following AED: Phenytoin  - As needed IV Ativan 2 mg for clinical seizure-like activity. Please be cautious when administering Ativan to minimize excessive sedation - She has remained seizure-free. Can be moved to progressive care if CCM team agrees. - Management of rest of comorbidities per primary team    LOS: 7 days   Anitha Bhuvaneswaran PA-C [facility] [phone]  35 minutes spent in the neurological evaluation of this critically ill patient. She is at high risk for acute neurological decompensation in the setting of continued PLEDs on EEG as well as depressed level of consciousness.   Electronically signed: Dr. Kerney Elbe

## 2020-08-26 NOTE — Progress Notes (Signed)
Asked by Dr Hortense Ramal to check on the patient with concern for Rt arm shaking. Checked with RN - no shaking witnessed during this shift. Exam unchanged from this AM's exam documented by Dr. Cheral Marker PHT was discontinued and she is on Keppra, Vimpat, Phenobarb and Perampanel. Plan was to initiate PHT if clinical concern persists for seizures. At this time, if there was a short duration of right arm shaking, it has not sustained for long. Will hold off on adding more AEDs at this time unless EEG shows status epilepticus. Neurology to continue to follow up in the AM.  -- Amie Portland, MD [facility]

## 2020-08-26 NOTE — Progress Notes (Signed)
Patient in A Fib RVR with rates as high as 150s.  CCM paged.  See MAR.  RN to continue to monitor.

## 2020-08-26 NOTE — Procedures (Addendum)
Patient Name:Lori Bridges PVV:[id number] Epilepsy Attending:Tyrrell Stephens [name] Referring Physician/Provider:Dr. Rozell Searing Duration:08/25/2020 1525 to 08/26/2020 1525  Patient history:86 year old female with history of seizures presented with breakthrough seizures and status epilepticus. EEG to assess for seizures.  Level of alertness:Awake, asleep  AEDs during EEG study:Keppra,Dilantin, perampanel,Vimpat, phenobarbital  Technical aspects: This EEG study was done with scalp electrodes positioned according to the 10-20 International system of electrode placement. Electrical activity was acquired at a sampling rate of 500Hz  and reviewed with a high frequency filter of 70Hz  and a low frequency filter of 1Hz . EEG data were recorded continuously and digitally stored.   Description:No definite posterior dominant rhythm was seen. EEG showed continuous generalized and maximal left parieto-occipital region 3 to 5 Hz theta-delta slowing. Periodic lateralized epileptiform discharges (PLEDs), maximal left parieto-occipital region were seen at 0.25-1hz .Hyperventilation and photic stimulation were not performed.  ABNORMALITY -PLEDs, left hemisphere -Continuous slow, generalized and maximal left parieto-occipital region  IMPRESSION: This studyshowed evidence of epileptogenicity and cortical dysfunction in left hemisphere, maximal left parieto-occipital region secondary to underlying structural abnormality. No seizures were seen throughout the study  EEG is stable compared to previous day.   [name] [name]

## 2020-08-27 ENCOUNTER — Encounter: Payer: Medicare Other | Admitting: Registered Nurse

## 2020-08-27 DIAGNOSIS — R569 Unspecified convulsions: Secondary | ICD-10-CM

## 2020-08-27 LAB — BASIC METABOLIC PANEL
Anion gap: 8 (ref 5–15)
BUN: 31 mg/dL — ABNORMAL HIGH (ref 8–23)
CO2: 25 mmol/L (ref 22–32)
Calcium: 9.4 mg/dL (ref 8.9–10.3)
Chloride: 105 mmol/L (ref 98–111)
Creatinine, Ser: 0.9 mg/dL (ref 0.44–1.00)
GFR calc Af Amer: >60 mL/min (ref >60)
GFR calc non Af Amer: 57 mL/min — ABNORMAL LOW (ref >60)
Glucose, Bld: 125 mg/dL — ABNORMAL HIGH (ref 70–99)
Potassium: 4.3 mmol/L (ref 3.5–5.1)
Sodium: 138 mmol/L (ref 135–145)

## 2020-08-27 LAB — CBC WITH DIFFERENTIAL/PLATELET
Abs Immature Granulocytes: 0.1 10*3/uL — ABNORMAL HIGH (ref 0.00–0.07)
Basophils Absolute: 0 10*3/uL (ref 0.0–0.1)
Basophils Relative: 0 %
Eosinophils Absolute: 0 10*3/uL (ref 0.0–0.5)
Eosinophils Relative: 0 %
HCT: 40.6 % (ref 36.0–46.0)
Hemoglobin: 13.4 g/dL (ref 12.0–15.0)
Immature Granulocytes: 1 %
Lymphocytes Relative: 10 %
Lymphs Abs: 0.9 10*3/uL (ref 0.7–4.0)
MCH: 30.4 pg (ref 26.0–34.0)
MCHC: 33 g/dL (ref 30.0–36.0)
MCV: 92.1 fL (ref 80.0–100.0)
Monocytes Absolute: 1.3 10*3/uL — ABNORMAL HIGH (ref 0.1–1.0)
Monocytes Relative: 15 %
Neutro Abs: 6.7 10*3/uL (ref 1.7–7.7)
Neutrophils Relative %: 74 %
Platelets: 140 10*3/uL — ABNORMAL LOW (ref 150–400)
RBC: 4.41 MIL/uL (ref 3.87–5.11)
RDW: 16.7 % — ABNORMAL HIGH (ref 11.5–15.5)
WBC: 9.1 10*3/uL (ref 4.0–10.5)
nRBC: 0 % (ref 0.0–0.2)

## 2020-08-27 LAB — PHOSPHORUS: Phosphorus: 2.9 mg/dL (ref 2.5–4.6)

## 2020-08-27 LAB — GLUCOSE, CAPILLARY
Glucose-Capillary: 116 mg/dL — ABNORMAL HIGH (ref 70–99)
Glucose-Capillary: 119 mg/dL — ABNORMAL HIGH (ref 70–99)
Glucose-Capillary: 120 mg/dL — ABNORMAL HIGH (ref 70–99)
Glucose-Capillary: 131 mg/dL — ABNORMAL HIGH (ref 70–99)
Glucose-Capillary: 145 mg/dL — ABNORMAL HIGH (ref 70–99)
Glucose-Capillary: 172 mg/dL — ABNORMAL HIGH (ref 70–99)

## 2020-08-27 LAB — MRSA PCR SCREENING: MRSA by PCR: NEGATIVE

## 2020-08-27 MED ORDER — INSULIN ASPART 100 UNIT/ML ~~LOC~~ SOLN
0.0000 [IU] | SUBCUTANEOUS | Status: DC
  Administered 2020-08-27 (×2): 1 [IU] via SUBCUTANEOUS
  Administered 2020-08-27 – 2020-08-28 (×2): 2 [IU] via SUBCUTANEOUS
  Administered 2020-08-29 – 2020-08-30 (×4): 1 [IU] via SUBCUTANEOUS
  Administered 2020-08-30: 2 [IU] via SUBCUTANEOUS
  Administered 2020-08-30 – 2020-09-02 (×7): 1 [IU] via SUBCUTANEOUS
  Administered 2020-09-03: 2 [IU] via SUBCUTANEOUS
  Administered 2020-09-03: 0 [IU] via SUBCUTANEOUS
  Administered 2020-09-03: 1 [IU] via SUBCUTANEOUS
  Administered 2020-09-04: 2 [IU] via SUBCUTANEOUS
  Administered 2020-09-04 (×5): 1 [IU] via SUBCUTANEOUS
  Administered 2020-09-05: 2 [IU] via SUBCUTANEOUS
  Administered 2020-09-05 (×2): 1 [IU] via SUBCUTANEOUS
  Administered 2020-09-05: 2 [IU] via SUBCUTANEOUS
  Administered 2020-09-05 (×2): 1 [IU] via SUBCUTANEOUS
  Administered 2020-09-06: 2 [IU] via SUBCUTANEOUS
  Administered 2020-09-06: 1 [IU] via SUBCUTANEOUS
  Administered 2020-09-06 (×3): 2 [IU] via SUBCUTANEOUS
  Administered 2020-09-07 – 2020-09-09 (×6): 1 [IU] via SUBCUTANEOUS
  Administered 2020-09-10: 2 [IU] via SUBCUTANEOUS
  Administered 2020-09-11 (×2): 1 [IU] via SUBCUTANEOUS

## 2020-08-27 MED ORDER — METOPROLOL TARTRATE 25 MG/10 ML ORAL SUSPENSION
75.0000 mg | Freq: Two times a day (BID) | ORAL | Status: DC
  Administered 2020-08-27 – 2020-09-04 (×16): 75 mg
  Filled 2020-08-27 (×18): qty 30

## 2020-08-27 NOTE — Progress Notes (Signed)
Spoke with RN DC LTM for MRI. Last tech is here till 1700. Would like to see if we can get MRI before then.

## 2020-08-27 NOTE — Progress Notes (Signed)
eLink Physician-Brief Progress Note Patient Name: Lori Bridges DOB: 10-09-1930 MRN: [id number]   Date of Service  08/27/2020  HPI/Events of Note  Patient is receiving tube feeds. CBG 170s.  eICU Interventions  ISS ordered Q4H.     Intervention Category Intermediate Interventions: Hyperglycemia - evaluation and treatment  Charlott Rakes 08/27/2020, 12:26 AM

## 2020-08-27 NOTE — Procedures (Addendum)
Patient Name:Lori Bridges GQB:[id number] Epilepsy Attending:Luvern Mischke [name] Referring Physician/Provider:Dr. Rozell Searing Duration:08/26/2020 1525 to 08/27/2020 1525  Patient history:86 year old female with history of seizures presented with breakthrough seizures and status epilepticus. EEG to assess for seizures.  Level of alertness:Awake, asleep  AEDs during EEG study:Keppra, perampanel,Vimpat, phenobarbital  Technical aspects: This EEG study was done with scalp electrodes positioned according to the 10-20 International system of electrode placement. Electrical activity was acquired at a sampling rate of 500Hz  and reviewed with a high frequency filter of 70Hz  and a low frequency filter of 1Hz . EEG data were recorded continuously and digitally stored.   Description:No definite posterior dominant rhythm was seen. EEG showed continuous generalized and maximal left parieto-occipital region 3 to 5 Hz theta-delta slowing. Periodic lateralized epileptiform discharges(PLEDs), maximal left parieto-occipital region were seen at 1-1.5Hz . At times the morphology of the discharges appeared more triphasic with a generalized distribution. Hyperventilation and photic stimulation were not performed.  Around 2248 on 08/26/2020 patient was noted to have left upper extremity twitching. Concomitant eeg showed generalized periodic epileptiform discharges with triphasic morphology at 1-2Hz  without clear evolution.  ABNORMALITY - PLEDs, left hemisphere - Periodic epileptiform discharges with triphasic morphology, generalized - Continuous slow, generalized and maximal left parieto-occipital region  IMPRESSION: This studyshowedevidence of epileptogenicity and cortical dysfunction in left hemisphere, maximal leftparieto-occipital region secondary to underlying structural abnormality. The periodic lateralized epileptiform discharges at times appear generalized with triphasic morphology  at 1 to 2 Hz and around the ictal-interictal continuum. One episode of left upper extremity twitching was noted on 08/26/2020 at 2248 without definite EEG change. However, focal motor seizures may not be always seen on scalp EEG.   [name] [name]

## 2020-08-27 NOTE — Progress Notes (Signed)
[facility] Progress Note Patient Name: Lori Bridges DOB: 01-26-1930 MRN: [id number]   Date of Service  08/27/2020  HPI/Events of Note  Tele-ICU RN requests evaluation for congestion, Afib with HR in the 130's, and concern for anxiety. On camera in heart rate is 95-106, BP 125/92, saturation 95 %, patient is resting comfortably.  eICU Interventions  No intervention.        Kerry Kass Jaxsyn Catalfamo 08/27/2020, 9:17 PM

## 2020-08-27 NOTE — Progress Notes (Addendum)
Subjective: Had a brief left upper extremity twitching concerning for focal motor seizure yesterday.  Daughter wanted at bedside denies any further seizure-like activity.  States patient sat down on side of the bed with physical therapist but still continues to be very lethargic, not communicating.  ROS: unable to obtain due to poor mental status  Examination  Vital signs in last 24 hours: Temp:  [98.4 F (36.9 C)-99 F (37.2 C)] 98.5 F (36.9 C) (08/30 0800) Pulse Rate:  [25-154] 88 (08/30 1100) Resp:  [18-29] 19 (08/30 1100) BP: (86-143)/(54-119) 122/59 (08/30 1100) SpO2:  [91 %-100 %] 94 % (08/30 1100) Weight:  [84.4 kg] 84.4 kg (08/30 0414)  General: lying in bed, not in apparent distress CVS: pulse-normal rate and rhythm RS: breathing comfortably, CTA B Extremities: normal, edema Neuro: Lethargic, open eyes to noxious stimuli only, does not follow commands, pupils equally round and reactive, midline with no gaze deviation, no apparent facial asymmetry, withdraws to noxious stimuli in all 4 extremities with slightly increased tone in bilateral upper extremity   Basic Metabolic Panel: Recent Labs  Lab 08/21/20 0749 08/21/20 0749 08/22/20 1550 08/22/20 1550 08/24/20 1224 08/24/20 1754 08/25/20 0350 08/25/20 1708 08/26/20 0343 08/27/20 0724  NA 141  --  139  --   --   --  140  --  138 138  K 3.5  --  4.1  --   --   --  3.8  --  4.3 4.3  CL 104  --  105  --   --   --  107  --  107 105  CO2 27  --  24  --   --   --  26  --  26 25  GLUCOSE 84  --  104*  --   --   --  140*  --  96 125*  BUN 17  --  18  --   --   --  23  --  26* 31*  CREATININE 1.13*  --  1.00  --   --   --  0.85  --  0.82 0.90  CALCIUM 10.0   < > 9.6   < >  --   --  9.6  --  9.5 9.4  MG 2.0  --   --   --  2.1 2.0 2.1 2.1  --   --   PHOS  --   --   --   --  3.1 2.9 2.1* 2.0*  --  2.9   < > = values in this interval not displayed.    CBC: Recent Labs  Lab 08/25/20 0350 08/26/20 0343 08/27/20 0724   WBC 6.8 6.6 9.1  NEUTROABS 5.4 4.9 6.7  HGB 13.1 13.2 13.4  HCT 41.5 41.1 40.6  MCV 96.3 96.5 92.1  PLT 166 199 140*     Coagulation Studies: No results for input(s): LABPROT, INR in the last 72 hours.  Imaging No new brain imaging   ASSESSMENT AND PLAN: 86 year-old female with recent left temporal hemorrhage (07/22/2020) and associated seizures who presented withfocal motorstatus epilepticus, now resolved, but with continuedevidence of epileptogenicity and cortical dysfunction intheleft hemisphere.  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 Gradually we increasedKeppra to 1000 mg twice daily(renally dosed), added Dilantin, perampanel, phenobarb and Vimpat.Had brief episode of left upper extremity twitching last night without definite EEG change.  Patient is  already on 4 AEDs and continues to be very lethargic, only opening eyes to noxious stimuli.  Therefore, will not escalate AED treatment at this point unless patient has frequent seizures.  Recommendations: - Continue the following AEDs: 1.Keppra 1000 mg twice daily 2.Vimpat 100 mg twice daily 3.Phenobarb 65 mg nightly 4.Perampanel 4 mg nightly -We will discontinue LTM EEG to obtain MRI brain without contrast to look for any acute abnormalities. -If MRI does not show any abnormalities in patient remained seizure-free will discontinue perampanel tomorrow. -We will check phenobarbital level.  If patient has any further breakthrough seizures and phenobarb level is subtherapeutic, can consider increasing. - As needed IV Ativan 2 mg for clinical seizure-like activity. Please be cautious when administering Ativan to minimize excessive sedation -Can be moved to progressive care if CCM team agrees. - Management of rest of comorbidities per primary team -Discussed management  plan in detail with patient's daughter Mariann Laster at bedside as well as critical care team.  CRITICAL CARE Performed by: Lora Havens   Total critical care time: 17minutes  Critical care time was exclusive of separately billable procedures and treating other patients.  Critical care was necessary to treat or prevent imminent or life-threatening deterioration.  Critical care was time spent personally by me on the following activities: development of treatment plan with patient and/or surrogate as well as nursing, discussions with consultants, evaluation of patient's response to treatment, examination of patient, obtaining history from patient or surrogate, ordering and performing treatments and interventions, ordering and review of laboratory studies, ordering and review of radiographic studies, pulse oximetry and re-evaluation of patient's condition.    Zeb Comfort Epilepsy [facility] For questions after 5pm please refer to AMION to reach the Neurologist on call

## 2020-08-27 NOTE — Evaluation (Signed)
Physical Therapy Evaluation Patient Details Name: Lori Bridges MRN: [id number] DOB: [birth year] Today's Date: 08/27/2020   History of Present Illness  Lori Bridges is a 86 y.o. female with medical history significant of paroxysmal A. fib, chronic diastolic CHF, hypertension, hyperlipidemia, GERD, recent ICH, degenerative disc disease, CKD stage IIIa presents to emergency department with right-sided seizure-like activity. Pt just returned home from rehab s/p stroke.    Clinical Impression  Pt admitted with above. Pt lethargic with no command follow or initiation of movement. Pt dependent x2 to sit EOB, was able to maintain eyes opened for the 6 min of EOB sitting with full posterior support. Per dtr pt was walking with RW with assist PTA. Pt now dependent for all mobility with no interaction to staff.  Recommend SNF upon d/c to progress mobility. Acute PT to cont to follow.    Follow Up Recommendations SNF    Equipment Recommendations  None recommended by PT    Recommendations for Other Services       Precautions / Restrictions Precautions Precautions: Fall Precaution Comments: edematous t/o body Restrictions Weight Bearing Restrictions: No Other Position/Activity Restrictions: on continuous EKG.       Mobility  Bed Mobility Overal bed mobility: Needs Assistance Bed Mobility: Supine to Sit;Sit to Supine     Supine to sit: Total assist;+2 for physical assistance Sit to supine: Total assist;+2 for physical assistance   General bed mobility comments: pt with no initiation of task or assist during transfer, pt completely dependent  Transfers                 General transfer comment: not appropriate at this time  Ambulation/Gait             General Gait Details: unable at this time  Stairs            Wheelchair Mobility    Modified Rankin (Stroke Patients Only) Modified Rankin (Stroke Patients Only) Pre-Morbid Rankin Score: Moderately severe  disability Modified Rankin: Severe disability     Balance Overall balance assessment: Needs assistance Sitting-balance support: Feet supported;No upper extremity supported Sitting balance-Leahy Scale: Poor Sitting balance - Comments: pt dependent to maintain EOB balance                                     Pertinent Vitals/Pain Pain Assessment: No/denies pain (but did grimace to noxious stimuli)    Home Living Family/patient expects to be discharged to:: Private residence Living Arrangements: Children Available Help at Discharge: Family;Available 24 hours/day Type of Home: House Home Access: Stairs to enter Entrance Stairs-Rails: None Entrance Stairs-Number of Steps: 3 Home Layout: One level Home Equipment: Walker - 2 wheels;Bedside commode      Prior Function Level of Independence: Needs assistance   Gait / Transfers Assistance Needed: rolling walker for short distance amb to/from bathroom, pt was either in the bed or in bathroom, didn't sit in recliner  ADL's / Homemaking Assistance Needed: maxA for dressing, bathing, dressing, was able to feed self with R UE        Hand Dominance   Dominant Hand: Right    Extremity/Trunk Assessment   Upper Extremity Assessment Upper Extremity Assessment: Defer to OT evaluation    Lower Extremity Assessment Lower Extremity Assessment:  (no active mvmt Bilaterally but did withdrawl to noxious stim)    Cervical / Trunk Assessment Cervical / Trunk Assessment: Normal  Communication  Communication: Expressive difficulties  Cognition Arousal/Alertness: Lethargic Behavior During Therapy: Flat affect Overall Cognitive Status: Difficult to assess Area of Impairment: Orientation;Following commands                 Orientation Level: Disoriented to;Place;Time;Situation     Following Commands: Follows one step commands inconsistently       General Comments: pt with no command follow, spontaneous movement,  or purposeful movement. Pt maintained eyes open once sititng EOB but didn't track      General Comments General comments (skin integrity, edema, etc.): pt edematous throughout entire body, HR increased into 130s while EOB but decreased to 110s, 120s, noted small skin tear on L posterior flank, RN notified, dressing placed    Exercises Other Exercises Other Exercises: passive trunk rotation, pt with noted bilat stiff shoulders with limited ROM   Assessment/Plan    PT Assessment Patient needs continued PT services  PT Problem List Decreased strength;Decreased range of motion;Decreased activity tolerance;Decreased balance;Decreased mobility;Decreased coordination;Decreased cognition;Decreased knowledge of use of DME;Decreased safety awareness       PT Treatment Interventions DME instruction;Gait training;Stair training;Functional mobility training;Therapeutic activities;Therapeutic exercise;Balance training;Neuromuscular re-education;Cognitive remediation    PT Goals (Current goals can be found in the Care Plan section)  Acute Rehab PT Goals PT Goal Formulation: With family Time For Goal Achievement: 09/10/20 Potential to Achieve Goals: Good    Frequency Min 2X/week   Barriers to discharge        Co-evaluation               AM-PAC PT "6 Clicks" Mobility  Outcome Measure Help needed turning from your back to your side while in a flat bed without using bedrails?: Total Help needed moving from lying on your back to sitting on the side of a flat bed without using bedrails?: Total Help needed moving to and from a bed to a chair (including a wheelchair)?: Total Help needed standing up from a chair using your arms (e.g., wheelchair or bedside chair)?: Total Help needed to walk in hospital room?: Total Help needed climbing 3-5 steps with a railing? : Total 6 Click Score: 6    End of Session Equipment Utilized During Treatment: Gait belt Activity Tolerance: Patient tolerated  treatment well Patient left: in bed;with call bell/phone within reach;with family/visitor present Nurse Communication: Mobility status PT Visit Diagnosis: Muscle weakness (generalized) (M62.81);Difficulty in walking, not elsewhere classified (R26.2)    Time: 8003-4917 PT Time Calculation (min) (ACUTE ONLY): 31 min   Charges:   PT Evaluation $PT Eval Moderate Complexity: 1 Mod          Kittie Plater, PT, DPT Acute Rehabilitation Services Pager #: [phone] Office #: [phone]   Berline Lopes 08/27/2020, 9:58 AM

## 2020-08-27 NOTE — Progress Notes (Signed)
NAME:  Lori Bridges, MRN:  [id number], DOB:  09-09-1930, LOS: 8 ADMISSION DATE:  08/18/2020, CONSULTATION DATE: 08/23/2020 REFERRING MD: Neurology, CHIEF COMPLAINT: Decreased level of consciousness  Brief History   86 year old with uncontrolled seizures  History of present illness   86 year old female with a history of uncontrolled hypertension who several months ago had an intracerebral hemorrhage requiring hospitalization and subsequent rehabilitation.  She was discharged home utilizing a walker but I was able to get around.  She is brought back to the hospital for decreased level consciousness and shaking movement of the right arm.  Neurology has been consulted in the morning giving increasing doses of antiseizure medication which is now causing the lethargy.  There is a concern for airway compromise therefore she will be transferred to intensive care unit for closer monitoring.  There has been discussion of intubation if needed although there are ongoing discussions with family about for T of this minute 86 year old with multiple health issues.  Past Medical History   Past Medical History:  Diagnosis Date  . Adenomatous colon polyp 2005  . Anxiety age 79  . Carotid artery bruit 2001   left  . CKD (chronic kidney disease), stage III   . DDD (degenerative disc disease)   . DJD (degenerative joint disease)   . Gastroenteritis 2011  . GERD (gastroesophageal reflux disease) 2003  . History of pneumonia   . HTN (hypertension) 1983  . Hyperlipidemia 2000  . Hypothyroidism 2009  . Iron deficiency anemia 1983  . Osteoarthritis 2001  . Osteoporosis 2001  . Permanent atrial fibrillation ([place]) 07/23/2010       . Persistent headaches 1987  . Pulmonary HTN ([place]) 04/03/2017   Moderate with PASP 79mmHg by echo 02/2017  . Tricuspid regurgitation    severe by echo 02/25/12  . Vertigo   . Vitamin D deficiency 2011    Significant Hospital Events   Transferred to the intensive care unit  08/23/2020  Consults:  Neurology Pulmonary critical care  Procedures:  Continuous EEG ETT  Significant Diagnostic Tests:  LTM shows continual epileptiform discharges   Micro Data:  SARS CoV2 - negative.  Antimicrobials:  none  Interim history/subjective:  Weaned off dilt gtt overnight SSI started overnight for hyperglycemia in setting of enteral nutrition  Seen in follow up by neurology overnight -- 1x episode of short duration RUE shaking, holding on adding more AEDs   Objective   Blood pressure 118/79, pulse 91, temperature 98.5 F (36.9 C), temperature source Axillary, resp. rate (!) 28, height 5\' 7"  (1.702 m), weight 84.4 kg, SpO2 96 %.        Intake/Output Summary (Last 24 hours) at 08/27/2020 0828 Last data filed at 08/27/2020 0600 Gross per 24 hour  Intake 1433.85 ml  Output 1000 ml  Net 433.85 ml   Filed Weights   08/25/20 0500 08/26/20 0500 08/27/20 0414  Weight: 87.6 kg 87.4 kg 84.4 kg    Examination: General: Chronically ill appearing frail elderly F, reclined in bed with open eyes  HEENT: NCAT pink mm Cortrak secure.   Neuro: Opens eyes spontaneously. Withdraws BUE BLE to pain, grimaces and groans to pain. + gag CV:RRR s1s2 no rgm cap refill < 3 seconds  PULM:  CTA bilaterally. Symmetrical chest expansion without accessory muscle use  GI: soft round ndnt +bowel sounds  Extremities: No obvious joint deformity. 2+ BUE BLE pitting edema  Skin: c/d/w. Scattered ecchymosis   Resolved Hospital Problem list     Assessment &  Plan:   Acute encephalopathy secondary to analeptics attempting to control seizure activity. Seizure History of intracerebral hemorrhage -Patient remains deeply sedated due to recurrent seizures, she is unable to follow any commands and she is mildly tachypnic P: Neurology following, appreciate assistance  Keppra, Vimpat, Phenobarb, Perampanel per neuro. If clinical concern for sz, neuro may initiate PHT PRN Ativan for generalized  tonic clonic sz > 2 minutes or focal sz > 92minutes Seizure precautions At risk for poor airway protection in this setting -- family would want ETT if indicated. Noted that family would not want trach   Atrial fibrillation with adequate rate control. -Home medications include lopressor -Previously on coumadin but this has remained on hold since  Development of intraparenchymal hemorrhage  P: Continue oral beta blocker  Continuous telemetry   Hypothyroidism  P: Continue Synthroid   Hyperglycemia P:  SSI  HTN P: Tele monitoring Lasix   Volume overload Net + 3.4L 8/30 P: Considering increasing Lasix   Best practice:  Diet: EN Pain/Anxiety/Delirium protocol (if indicated): Not indicated VAP protocol (if indicated): Not indicated DVT prophylaxis: Sequential compression hose  GI prophylaxis: PPI Glucose control: Sliding scale Mobility: Bedrest Code Status: Full Family Communication: Daughter updated at bedside 8/30 Disposition: In ICU -- when more awake transfer back to floor   Labs   CBC: Recent Labs  Lab 08/25/20 0350 08/26/20 0343 08/27/20 0724  WBC 6.8 6.6 9.1  NEUTROABS 5.4 4.9 PENDING  HGB 13.1 13.2 13.4  HCT 41.5 41.1 40.6  MCV 96.3 96.5 92.1  PLT 166 199 PENDING    Basic Metabolic Panel: Recent Labs  Lab 08/21/20 0749 08/22/20 1550 08/24/20 1224 08/24/20 1754 08/25/20 0350 08/25/20 1708 08/26/20 0343 08/27/20 0724  NA 141 139  --   --  140  --  138 138  K 3.5 4.1  --   --  3.8  --  4.3 4.3  CL 104 105  --   --  107  --  107 105  CO2 27 24  --   --  26  --  26 25  GLUCOSE 84 104*  --   --  140*  --  96 125*  BUN 17 18  --   --  23  --  26* 31*  CREATININE 1.13* 1.00  --   --  0.85  --  0.82 0.90  CALCIUM 10.0 9.6  --   --  9.6  --  9.5 9.4  MG 2.0  --  2.1 2.0 2.1 2.1  --   --   PHOS  --   --  3.1 2.9 2.1* 2.0*  --  2.9   GFR: Estimated Creatinine Clearance: 48.2 mL/min (by C-G formula based on SCr of 0.9 mg/dL). Recent Labs  Lab  08/25/20 0350 08/26/20 0343 08/27/20 0724  WBC 6.8 6.6 9.1    Liver Function Tests: No results for input(s): AST, ALT, ALKPHOS, BILITOT, PROT, ALBUMIN in the last 168 hours. No results for input(s): LIPASE, AMYLASE in the last 168 hours. No results for input(s): AMMONIA in the last 168 hours.  ABG    Component Value Date/Time   TCO2 28 04/03/2012 2232     Coagulation Profile: No results for input(s): INR, PROTIME in the last 168 hours.  Cardiac Enzymes: No results for input(s): CKTOTAL, CKMB, CKMBINDEX, TROPONINI in the last 168 hours.  HbA1C: Hgb A1c MFr Bld  Date/Time Value Ref Range Status  07/23/2020 02:43 AM 5.7 (H) 4.8 - 5.6 % Final  Comment:    (NOTE) Pre diabetes:          5.7%-6.4%  Diabetes:              >6.4%  Glycemic control for   <7.0% adults with diabetes     CBG: Recent Labs  Lab 08/26/20 1544 08/26/20 1939 08/27/20 0001 08/27/20 0327 08/27/20 0738  GLUCAP 135* 135* 172* 145* 120*   CRITICAL CARE Performed by: Cristal Generous   Total critical care time: 38 minutes  Critical care time was exclusive of separately billable procedures and treating other patients.  Critical care was necessary to treat or prevent imminent or life-threatening deterioration.  Critical care was time spent personally by me on the following activities: development of treatment plan with patient and/or surrogate as well as nursing, discussions with consultants, evaluation of patient's response to treatment, examination of patient, obtaining history from patient or surrogate, ordering and performing treatments and interventions, ordering and review of laboratory studies, ordering and review of radiographic studies, pulse oximetry and re-evaluation of patient's condition.  Eliseo Gum MSN, AGACNP-BC [place] [phone] If no answer, [phone] 08/27/2020, 8:28 AM

## 2020-08-27 NOTE — Progress Notes (Signed)
LTM maint complete - no skin breakdown under:  fp1, fp2, p8 and p4

## 2020-08-27 NOTE — Progress Notes (Signed)
Per Neuro pt can wait for EEG unhook until 08/28/20

## 2020-08-27 NOTE — Evaluation (Signed)
Occupational Therapy Evaluation Patient Details Name: Lori Bridges MRN: [id number] DOB: 1930/04/03 Today's Date: 08/27/2020    History of Present Illness Lori Bridges is a 86 y.o. female with medical history significant of paroxysmal A. fib, chronic diastolic CHF, hypertension, hyperlipidemia, GERD, recent ICH, degenerative disc disease, CKD stage IIIa presents to emergency department with right-sided seizure-like activity. Pt just returned home on 8/19 from CIR s/p stroke affecting right side.   Clinical Impression   This 86 yo female admitted with above presents to acute OT with PLOF of recently D/C'd from CIR and doing some walking at home with RW, some grooming, some UB ADLs, and feeding herself. Currently pt is total A for all basic ADLs and total A+2 for all bed mobility. She will benefit from acute OT with follow up recommended at SNF.    Follow Up Recommendations  SNF;Supervision/Assistance - 24 hour    Equipment Recommendations  None recommended by OT       Precautions / Restrictions Precautions Precautions: Fall Precaution Comments: edematous t/o body Restrictions Weight Bearing Restrictions: No Other Position/Activity Restrictions: on continuous EKG.       Mobility Bed Mobility Overal bed mobility: Needs Assistance Bed Mobility: Supine to Sit;Sit to Supine     Supine to sit: Total assist;+2 for physical assistance Sit to supine: Total assist;+2 for physical assistance   General bed mobility comments: pt with no initiation of task or assist during transfer, pt completely dependent  Transfers                 General transfer comment: not appropriate at this time    Balance Overall balance assessment: Needs assistance Sitting-balance support: Feet supported;No upper extremity supported Sitting balance-Leahy Scale: Poor Sitting balance - Comments: pt dependent to maintain EOB balance                                   ADL either  performed or assessed with clinical judgement   ADL                                         General ADL Comments: total A     Vision Baseline Vision/History: Wears glasses Wears Glasses: At all times Additional Comments: Pt with eyes closed the first few minutes of session (while supine), then she did open them and maintained them open the entire time she was sitting EOB. Eyes were fixed (no tracking noted, no moving of eyes or head to voices/sounds in room, no blink to threat)            Pertinent Vitals/Pain Pain Assessment:  (grimace to noxious stimuli Bil LEs)     Hand Dominance Right   Extremity/Trunk Assessment Upper Extremity Assessment Upper Extremity Assessment: RUE deficits/detail;LUE deficits/detail RUE Deficits / Details: Bil UE pitting edema (R>L), tone noted in shoulder, elbow perhaps tone too (but so much more edema than left hard to truly say), PROM of fingers WNL (not tightness note); no response to noxious stimuli RUE Coordination: decreased fine motor;decreased gross motor LUE Deficits / Details: Bil UE pitting edema (R>L), tone noted in shoulder, slight decrease in PROM of elbow (edema), PROM of fingers WNL (not tightness note);no response to noxious stimuli LUE Coordination: decreased fine motor;decreased gross motor   Lower Extremity Assessment Lower Extremity Assessment:  (no active  mvmt Bilaterally but did withdrawl to noxious stim)   Cervical / Trunk Assessment Cervical / Trunk Assessment: Normal   Communication Communication Communication: Expressive difficulties   Cognition Arousal/Alertness: Lethargic Behavior During Therapy: Flat affect Overall Cognitive Status: Difficult to assess Area of Impairment: Orientation;Following commands                 Orientation Level: Disoriented to;Place;Time;Situation     Following Commands: Follows one step commands inconsistently       General Comments: pt with no command follow,  spontaneous movement, or purposeful movement. Pt maintained eyes open once sititng EOB but didn't track   General Comments  pt edematous throughout entire body, HR increased into 130s while EOB but decreased to 110s, 120s, noted small skin tear on L posterior flank, RN notified, dressing placed    Exercises Other Exercises Other Exercises: passive trunk rotation, pt with noted bilat stiff shoulders with limited ROM        Home Living Family/patient expects to be discharged to:: Private residence Living Arrangements: Children Available Help at Discharge: Family;Available 24 hours/day Type of Home: House Home Access: Stairs to enter [employer] of Steps: 3 Entrance Stairs-Rails: None Home Layout: One level     Bathroom Shower/Tub: Teacher, early years/pre: Standard     Home Equipment: Environmental consultant - 2 wheels;Bedside commode          Prior Functioning/Environment Level of Independence: Needs assistance  Gait / Transfers Assistance Needed: rolling walker for short distance amb to/from bathroom, pt was either in the bed or in bathroom, didn't sit in recliner ADL's / Homemaking Assistance Needed: maxA for dressing, bathing, dressing, was able to feed self with R UE            OT Problem List: Decreased strength;Decreased range of motion;Decreased activity tolerance;Impaired balance (sitting and/or standing);Impaired vision/perception;Impaired UE functional use;Decreased safety awareness;Impaired tone;Increased edema;Decreased cognition;Impaired sensation;Decreased coordination      OT Treatment/Interventions: DME and/or AE instruction;Patient/family education;Balance training;Therapeutic activities;Therapeutic exercise;Self-care/ADL training;Cognitive remediation/compensation;Visual/perceptual remediation/compensation    OT Goals(Current goals can be found in the care plan section) Acute Rehab OT Goals Patient Stated Goal: family would like her home OT Goal  Formulation: With family Time For Goal Achievement: 09/10/20 Potential to Achieve Goals: Fair  OT Frequency: Min 2X/week           Co-evaluation PT/OT/SLP Co-Evaluation/Treatment: Yes Reason for Co-Treatment: For patient/therapist safety   OT goals addressed during session: Strengthening/ROM      AM-PAC OT "6 Clicks" Daily Activity     Outcome Measure Help from another person eating meals?: Total Help from another person taking care of personal grooming?: Total Help from another person toileting, which includes using toliet, bedpan, or urinal?: Total Help from another person bathing (including washing, rinsing, drying)?: Total Help from another person to put on and taking off regular upper body clothing?: Total Help from another person to put on and taking off regular lower body clothing?: Total 6 Click Score: 6   End of Session Nurse Communication:  (small skin tear on back--mepilex applied; RN in to see it)  Activity Tolerance: Patient limited by lethargy Patient left: in bed;with call bell/phone within reach;with bed alarm set;with family/visitor present  OT Visit Diagnosis: Other abnormalities of gait and mobility (R26.89);Muscle weakness (generalized) (M62.81);Low vision, both eyes (H54.2);Other symptoms and signs involving cognitive function;Cognitive communication deficit (R41.841);Hemiplegia and hemiparesis Symptoms and signs involving cognitive functions:  (seizures) Hemiplegia - Right/Left:  (Bil)  Time: 3007-6226 OT Time Calculation (min): 31 min Charges:  OT General Charges $OT Visit: 1 Visit OT Evaluation $OT Eval Moderate Complexity: [place], OTR/L Acute [employer] Pager [phone] Office [phone]     Almon Register 08/27/2020, 10:56 AM

## 2020-08-27 NOTE — Progress Notes (Signed)
Spoke with MRI, STAT scan to be done after 5pm. EEG tech aware.

## 2020-08-28 ENCOUNTER — Inpatient Hospital Stay (HOSPITAL_COMMUNITY): Payer: Medicare Other

## 2020-08-28 DIAGNOSIS — Z515 Encounter for palliative care: Secondary | ICD-10-CM

## 2020-08-28 DIAGNOSIS — K219 Gastro-esophageal reflux disease without esophagitis: Secondary | ICD-10-CM

## 2020-08-28 DIAGNOSIS — E039 Hypothyroidism, unspecified: Secondary | ICD-10-CM

## 2020-08-28 LAB — GLUCOSE, CAPILLARY
Glucose-Capillary: 102 mg/dL — ABNORMAL HIGH (ref 70–99)
Glucose-Capillary: 110 mg/dL — ABNORMAL HIGH (ref 70–99)
Glucose-Capillary: 111 mg/dL — ABNORMAL HIGH (ref 70–99)
Glucose-Capillary: 115 mg/dL — ABNORMAL HIGH (ref 70–99)
Glucose-Capillary: 116 mg/dL — ABNORMAL HIGH (ref 70–99)
Glucose-Capillary: 119 mg/dL — ABNORMAL HIGH (ref 70–99)
Glucose-Capillary: 136 mg/dL — ABNORMAL HIGH (ref 70–99)
Glucose-Capillary: 168 mg/dL — ABNORMAL HIGH (ref 70–99)

## 2020-08-28 LAB — PHENOBARBITAL LEVEL: Phenobarbital: 15.1 ug/mL (ref 15.0–30.0)

## 2020-08-28 NOTE — Progress Notes (Signed)
vLTM EEG complete. Multiple scabbing from healing skin breakdown. New breakdown on right side at electrode site F8. Clean area and let nursing know.

## 2020-08-28 NOTE — Progress Notes (Signed)
Subjective: Patient daughter Lori Bridges at bedside states patient has not had any further seizures since yesterday.  She states this morning patient was able to open her eyes and communicate with her daughter, was able to tell her that she is comfortable, initially thought daughter is one of our nurses but eventually was able to get daughter's name correct.  Denies any new concerns.   ROS: Unable to obtain due to poor mental status  Examination  Vital signs in last 24 hours: Temp:  [98.5 F (36.9 C)-98.8 F (37.1 C)] 98.6 F (37 C) (08/31 1140) Pulse Rate:  [49-126] 50 (08/31 1140) Resp:  [11-25] 18 (08/31 1140) BP: (92-132)/(35-109) 95/60 (08/31 1140) SpO2:  [92 %-98 %] 96 % (08/31 1140)  General: lying in bed, not in apparent distress CVS: pulse-normal rate and rhythm RS: breathing comfortably, CTA B Extremities: normal, edema Neuro: open eyes to noxious stimuli only, does not follow commands, pupils equally round and reactive, midline with no gaze deviation, no apparent facial asymmetry, withdraws to noxious stimuli in all 4 extremities (did receive AEDs prior to my examination)  Basic Metabolic Panel: Recent Labs  Lab 08/22/20 1550 08/22/20 1550 08/24/20 1224 08/24/20 1754 08/25/20 0350 08/25/20 1708 08/26/20 0343 08/27/20 0724  NA 139  --   --   --  140  --  138 138  K 4.1  --   --   --  3.8  --  4.3 4.3  CL 105  --   --   --  107  --  107 105  CO2 24  --   --   --  26  --  26 25  GLUCOSE 104*  --   --   --  140*  --  96 125*  BUN 18  --   --   --  23  --  26* 31*  CREATININE 1.00  --   --   --  0.85  --  0.82 0.90  CALCIUM 9.6   < >  --   --  9.6  --  9.5 9.4  MG  --   --  2.1 2.0 2.1 2.1  --   --   PHOS  --   --  3.1 2.9 2.1* 2.0*  --  2.9   < > = values in this interval not displayed.    CBC: Recent Labs  Lab 08/25/20 0350 08/26/20 0343 08/27/20 0724  WBC 6.8 6.6 9.1  NEUTROABS 5.4 4.9 6.7  HGB 13.1 13.2 13.4  HCT 41.5 41.1 40.6  MCV 96.3 96.5 92.1  PLT  166 199 140*     Coagulation Studies: No results for input(s): LABPROT, INR in the last 72 hours.  Imaging MRI brain without contrast 08/28/2020: Acute subcentimeter right cerebellar infarct.  Decreased size of left parietal and left temporal lobe hemorrhages with mild residual edema at the former. Mild surrounding cortical signal abnormality in the left parietal lobe may reflect an associated subacute infarct and possibly recent seizure activity.    ASSESSMENT AND PLAN: 86 year-old female with recent left temporal hemorrhage (07/22/2020) and associated seizures who presented withfocal motorstatus epilepticus, now resolved, but with continuedevidence of epileptogenicity and cortical dysfunction intheleft hemisphere.  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 Gradually we increasedKeppra to 1000 mg twice daily(renally dosed), added Dilantin, perampanel, phenobarb and Vimpat.Had  brief episode of left upper extremity twitching last night without definite EEG change.  Patient is already on 4 AEDs and continues to be very lethargic, only opening eyes to noxious stimuli.  Therefore, will not escalate AED treatment at this point unless patient has frequent seizures. -Patient outside of TPA window  Recommendations: -As patient remains seizure-free, will discontinue perampanel.   - Continuethe followingAEDs: 1.Keppra 1000 mg twice daily 2.Vimpat 100 mg twice daily 3.Phenobarb 65 mg nightly -We will check phenobarbital level.  If patient has any further breakthrough seizures and phenobarb level is subtherapeutic, can consider increasing. -We will discuss management of acute coincidental stroke on MRI with stroke attending. - As needed IV Ativan 2 mg for clinical seizure-like  activity. Please be cautious when administering Ativan to minimize excessive sedation -We will request social worker/case manager to check the cost of Vimpat in preparation for patient's discharge eventually..  If Vimpat discussed prohibitive, will consider switching to Depakote or oxcarbazepine - Management of rest of comorbidities per primary team -Discussed management plan in detail with patient's daughter Lori Bridges at bedside as well as Dr. Alfredia Ferguson  I have spent a total of  35  minutes with the patient reviewing hospital notes,  test results, labs and examining the patient as well as establishing an assessment and plan that was discussed personally with the patient's daughter Lori Bridges at bedside and Dr. Alfredia Ferguson.  > 50% of time was spent in direct patient care.   Zeb [facility] Epile[facility] For questions after 5pm please refer to AMION to reach the Neurologist on call

## 2020-08-28 NOTE — Progress Notes (Addendum)
PROGRESS NOTE    Lori Bridges  KVQ:[id number] DOB: 10-Sep-1930 DOA: 08/18/2020 PCP: Derinda Late, MD   Brief Narrative:  The patient is an 86 year old elderly chronically ill-appearing female with a past medical history significant for uncontrolled hypertension who had an interest cerebral hemorrhage requiring hospitalization and subsequent rehabilitation and then was discharged home using a utility walker but was unable to get around. She was brought back to the hospital for decreased level consciousness as well as shaking her right arm. Neurology was consulted and the neurologist started antiepileptics. There is concern for airway compromise so she was transferred to the ICU for closer monitoring and there has been discussion of intubation in the 86 year old female with multiple health issues. She is admitted for acute encephalopathy secondary to seizure activity and remained deeply sedated due to recurrent seizures. She was unable to follow commands. Neurology is following and recommending antiepileptics. She had a recent MRI which showed a acute CVA.  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 Seizure History of intracerebral hemorrhage -Patient remains deeply sedated due to recurrent seizures, she is unable to follow any commands and she is mildly tachypnic -Neurology following, appreciate assistance  -C/w Keppra, Vimpat, Phenobarb, with  Neurology managing; Perampanel now discontiued per neuro. -If clinical concern for sz, neuro may initiate PHT -PRN Ativan for generalized tonic clonic sz > 2 minutes or focal sz > 84minutes Seizure precautions -At risk for poor airway protection in this setting -- family would want ETT if indicated. Noted that family would not want trach   -Currently has a Panda tube is getting tube feedings through that -Check labs in AM   Acute CVA -MRI shows "Acute subcentimeter right cerebellar infarct. 2. Decreased size of left parietal and left temporal lobe hemorrhages with mild residual edema at the former. Mild surrounding cortical signal abnormality in the left parietal lobe may reflect an associated subacute infarct and possibly recent seizure activity." -Neurology following and will discuss with Dr. Felecia Shelling   Atrial fibrillation with RVR in a patient with Permanent A Fib -Home medications include lopressor -Previously on coumadin but this has remained on hold since  Development of intraparenchymal hemorrhage  -Continue oral beta blocker p.o. twice daily per tube -Continuous telemetry; remains in A Fib  -Currently on a diltiazem drip and will continue for now -if not improving will consult cardiology for further evaluation  Hypothyroidism  -Continue Synthroid  75 mcg per tube daily -TSH was 4.884  Hyperglycemia in the setting of Pre-Diabete -C/w SSI and is currently on a sensitive NovoLog sign scale every 4 hours -CBG's ranging from 100-145 -HbA1c was 5.7 in July  HTN -C/w Tele monitoring -Lasix 20 mg per Tube BID -Continue with metoprolol 75 mg per tube twice daily -Continue with labetalol 10 mg IV every 6 hours as needed for blood pressure of systolic greater than 643 or diastolic reading 329  Thrombocytopenia -Patient's platelet count was 140 -Continue to monitor for signs and symptoms of  bleeding -Repeat CBC in a.m.  Volume overload and Acute on Chronic Diastolic CHF -Net +  2.671 L 8/30 -C/w po Lasix  -Strict I's and O's and Daily Weights  GERD/Reflux -C/w PPI 40 mg po Pantoprazole Daily PRN  DVT prophylaxis: SCDs Code Status: FULL CODE  Family Communication: Discussed with Daughter at bedside Disposition Plan: Pending clinical improvement and Clearance by Neurology to go to SNF  Status is: Inpatient  Remains inpatient appropriate because:Hemodynamically unstable and IV treatments appropriate due to intensity of illness or inability to take PO   Dispo:  Patient From: Home  Planned Disposition: [place]  Expected discharge date: 08/31/20  Medically stable for discharge: No   Consultants:   Neurology  PCCM Transfer   Procedures:  EEG, Continuous EEG, LTM, MRI  Antimicrobials:  Anti-infectives (From admission, onward)   None     Subjective: Seen and examined at bedside and she remains encephalopathic still but daughter states that she is waking up a little bit more noted to say some stuff to her.  No nausea or vomiting.  Getting tube feedings through her nose.  Neurology discontinuing one of her antiepileptics.  We will continue to monitor.   Objective: Vitals:   08/28/20 0729 08/28/20 1140 08/28/20 1325 08/28/20 1526  BP: 129/74 95/60 126/77 115/80  Pulse: 63 (!) 50 97 84  Resp: 19 18 20 19   Temp: 98.6 F (37 C) 98.6 F (37 C) 98.9 F (37.2 C) 98.6 F (37 C)  TempSrc:  Axillary Axillary Axillary  SpO2: 95% 96% 97% 98%  Weight:      Height:        Intake/Output Summary (Last 24 hours) at 08/28/2020 1836 Last data filed at 08/27/2020 2100 Gross per 24 hour  Intake 165 ml  Output --  Net 165 ml   Filed Weights   08/25/20 0500 08/26/20 0500 08/27/20 0414  Weight: 87.6 kg 87.4 kg 84.4 kg   Examination: Physical Exam:  Constitutional: Chronically ill-appearing overweight female currently in no acute distress Eyes: Lids and conjunctivae normal, sclerae anicteric  ENMT: External Ears, Nose appear normal.   Has a Panda tube in place Neck: Appears normal, supple, no cervical masses, normal ROM, no appreciable thyromegaly; no JVD Respiratory: Diminished to auscultation bilaterally, no wheezing, rales, rhonchi or crackles. Normal respiratory effort and patient is not tachypenic. No accessory muscle use.  Cardiovascular: Irregularly irregular, no murmurs / rubs / gallops. S1 and S2 auscultated.  1+ lymphedema Abdomen: Soft, non-tender, distended secondary body habitus. Bowel sounds positive.  GU: Deferred. Musculoskeletal: No clubbing / cyanosis of digits/nails. No joint deformity upper and lower extremities. Skin: No rashes, lesions, ulcers on a limited skin evaluation. No induration; Warm and dry.  Neurologic: CN 2-12 grossly intact with no focal deficits. Romberg sign cerebellar reflexes not assessed.  Psychiatric: Impaired judgment and insight.  She is not alert and oriented x 3. Normal mood and appropriate affect.   Data Reviewed: I have personally reviewed following labs and imaging studies  CBC: Recent Labs  Lab 08/25/20 0350 08/26/20 0343 08/27/20 0724  WBC 6.8 6.6 9.1  NEUTROABS 5.4 4.9 6.7  HGB 13.1 13.2 13.4  HCT 41.5 41.1 40.6  MCV 96.3 96.5 92.1  PLT 166 199 245*   Basic Metabolic Panel: Recent Labs  Lab 08/22/20 1550 08/24/20 1224 08/24/20 1754 08/25/20 0350 08/25/20 1708 08/26/20 0343 08/27/20 0724  NA 139  --   --  140  --  138 138  K 4.1  --   --  3.8  --  4.3 4.3  CL 105  --   --  107  --  107 105  CO2 24  --   --  26  --  26 25  GLUCOSE 104*  --   --  140*  --  96 125*  BUN 18  --   --  23  --  26* 31*  CREATININE 1.00  --   --  0.85  --  0.82 0.90  CALCIUM 9.6  --   --  9.6  --  9.5 9.4  MG  --  2.1 2.0 2.1 2.1  --   --   PHOS  --  3.1 2.9 2.1* 2.0*  --  2.9   GFR: Estimated Creatinine Clearance: 48.2 mL/min (by C-G formula based on SCr of 0.9 mg/dL). Liver Function Tests: No results for input(s): AST, ALT, ALKPHOS, BILITOT, PROT, ALBUMIN in the last 168  hours. No results for input(s): LIPASE, AMYLASE in the last 168 hours. No results for input(s): AMMONIA in the last 168 hours. Coagulation Profile: No results for input(s): INR, PROTIME in the last 168 hours. Cardiac Enzymes: No results for input(s): CKTOTAL, CKMB, CKMBINDEX, TROPONINI in the last 168 hours. BNP (last 3 results) No results for input(s): PROBNP in the last 8760 hours. HbA1C: No results for input(s): HGBA1C in the last 72 hours. CBG: Recent Labs  Lab 08/28/20 0000 08/28/20 0642 08/28/20 0731 08/28/20 1139 08/28/20 1610  GLUCAP 115* 119* 116* 168* 110*   Lipid Profile: No results for input(s): CHOL, HDL, LDLCALC, TRIG, CHOLHDL, LDLDIRECT in the last 72 hours. Thyroid Function Tests: No results for input(s): TSH, T4TOTAL, FREET4, T3FREE, THYROIDAB in the last 72 hours. Anemia Panel: No results for input(s): VITAMINB12, FOLATE, FERRITIN, TIBC, IRON, RETICCTPCT in the last 72 hours. Sepsis Labs: No results for input(s): PROCALCITON, LATICACIDVEN in the last 168 hours.  Recent Results (from the past 240 hour(s))  SARS Coronavirus 2 by RT PCR (hospital order, performed in [facility] hospital lab) Nasopharyngeal Nasopharyngeal Swab     Status: None   Collection Time: 08/19/20 12:54 PM   Specimen: Nasopharyngeal Swab  Result Value Ref Range Status   SARS Coronavirus 2 NEGATIVE NEGATIVE Final    Comment: (NOTE) SARS-CoV-2 target nucleic acids are NOT DETECTED.  The SARS-CoV-2 RNA is generally detectable in upper and lower respiratory specimens during the acute phase of infection. The lowest concentration of SARS-CoV-2 viral copies this assay can detect is 250 copies / mL. A negative result does not preclude SARS-CoV-2 infection and should not be used as the sole basis for treatment or other patient management decisions.  A negative result may occur with improper specimen collection / handling, submission of specimen other than nasopharyngeal swab, presence of  viral mutation(s) within the areas targeted by this assay, and inadequate number of viral copies (<250 copies / mL). A negative result must be combined with clinical observations, patient history, and epidemiological information.  Fact Sheet for Patients:   StrictlyIdeas.no  Fact Sheet for Healthcare Providers: BankingDealers.co.za  This test is not yet approved or  cleared by the [place] FDA and has been authorized for detection and/or diagnosis of SARS-CoV-2 by FDA under an Emergency Use Authorization (EUA).  This EUA will remain in effect (meaning this test can be used) for the duration of the COVID-19 declaration under Section 564(b)(1) of the Act, 21 U.S.C. section 360bbb-3(b)(1), unless the authorization is terminated or revoked sooner.  Performed at [facility], [address], [place], [place] [address]   MRSA PCR Screening     Status: None   Collection Time: 08/27/20 12:07 PM   Specimen: Nasopharyngeal  Result Value Ref Range Status   MRSA by PCR NEGATIVE NEGATIVE Final    Comment:        The GeneXpert MRSA Assay (FDA approved for NASAL specimens only), is one component of a comprehensive MRSA colonization surveillance program. It is not intended to diagnose MRSA infection nor to guide or monitor treatment for MRSA infections. Performed at [facility], [place] [address] [place], [place] [address]      RN Pressure Injury Documentation: Pressure Injury 08/26/20 Buttocks Stage 1 -  Intact skin with non-blanchable redness of a localized area usually over a bony prominence. (Active)  08/26/20 1545  Location: Buttocks  Location Orientation:   Staging: Stage 1 -  Intact skin with non-blanchable redness of a localized area usually over a bony prominence.  Wound Description (Comments):   Present on Admission:      Estimated body mass index is 29.14 kg/m as calculated from the following:   Height as of  this encounter: 5\' 7"  (1.702 m).   Weight as of this encounter: 84.4 kg.  Malnutrition Type:  Nutrition Problem: Inadequate oral intake Etiology: inability to eat   Malnutrition Characteristics:  Signs/Symptoms: NPO status   Nutrition Interventions:  Interventions: Tube feeding, Prostat   Radiology Studies: MR BRAIN WO CONTRAST  Result Date: 08/28/2020 CLINICAL DATA:  Seizure.  History of ICH last month. EXAM: MRI HEAD WITHOUT CONTRAST TECHNIQUE: Multiplanar, multiecho pulse sequences of the brain and surrounding structures were obtained without intravenous contrast. COMPARISON:  Head CT 08/22/2020 and MRI 07/22/2020 FINDINGS: The study is moderately motion degraded. Brain: There is a 2.2 x 1.4 cm parenchymal hematoma in the medial left parietal lobe which is T1 and T2 hyperintense and has slightly decreased in size from the prior MRI with mild residual surrounding edema and no significant mass effect. Mild surrounding gyral T1 hyperintensity suggests laminar necrosis. There is faint cortically based trace diffusion weighted signal hyperintensity both superior and inferior to the hematoma including in the left postcentral gyrus. A small parenchymal hemorrhage in the inferolateral left temporal lobe has also decreased in size and now measures 8 mm without residual edema. No significant residual extra-axial hemorrhage is evident. There is a subcentimeter acute infarct in the right cerebellum. There is mild cerebral atrophy. Periventricular white matter T2 hyperintensities are similar to the prior MRI and are nonspecific but compatible with minimal chronic small vessel ischemic disease. There is no midline shift or extra-axial fluid collection. Vascular: Major intracranial vascular flow voids are grossly preserved. Skull and upper cervical spine: Unremarkable bone marrow signal. Sinuses/Orbits: Unremarkable orbits. Paranasal sinuses and mastoid air cells are clear. Other: None. IMPRESSION: 1. Acute  subcentimeter right cerebellar infarct. 2. Decreased size of left parietal and left temporal lobe hemorrhages with mild residual edema at the former. Mild surrounding cortical signal abnormality in the left parietal lobe may reflect an associated subacute infarct and possibly recent seizure activity. Electronically Signed   By: Logan Bores M.D.   On: 08/28/2020 11:11   Scheduled Meds: . chlorhexidine  15 mL Mouth Rinse BID  . Chlorhexidine Gluconate Cloth  6 each Topical Daily  . feeding supplement (PROSource TF)  45 mL Per Tube BID  . furosemide  20 mg Per Tube BID  . insulin aspart  0-9 Units Subcutaneous Q4H  . lacosamide  100 mg  Per Tube BID  . levETIRAcetam  1,000 mg Per Tube BID  . levothyroxine  75 mcg Per Tube Q0600  . mouth rinse  15 mL Mouth Rinse q12n4p  . metoprolol tartrate  75 mg Per Tube BID  . PHENObarbital  60 mg Per Tube QHS   Continuous Infusions: . diltiazem (CARDIZEM) infusion Stopped (08/27/20 0303)  . feeding supplement (OSMOLITE 1.2 CAL) 1,000 mL (08/28/20 1104)    LOS: 9 days   Kerney Elbe, DO [facility] PAGER is on [place]  If 7PM-7AM, please contact night-coverage [email or web address]

## 2020-08-28 NOTE — Plan of Care (Signed)
  Problem: Medication: Goal: Risk for medication side effects will decrease Outcome: Progressing   Problem: Safety: Goal: Verbalization of understanding the information provided will improve Outcome: Progressing   Problem: Nutrition: Goal: Adequate nutrition will be maintained Outcome: Progressing

## 2020-08-28 NOTE — Procedures (Signed)
Patient Name:Lori Bridges:[id number] Epilepsy Attending:Dara Camargo [name] Referring Physician/Provider:Dr. Rozell Searing Duration:08/27/2020 1525 to 8/31/20210830  Patient history:86 year old female with history of seizures presented with breakthrough seizures and status epilepticus. EEG to assess for seizures.  Level of alertness:Awake, asleep  AEDs during EEG study:Keppra, perampanel,Vimpat, phenobarbital  Technical aspects: This EEG study was done with scalp electrodes positioned according to the 10-20 International system of electrode placement. Electrical activity was acquired at a sampling rate of 500Hz  and reviewed with a high frequency filter of 70Hz  and a low frequency filter of 1Hz . EEG data were recorded continuously and digitally stored.   Description:No definite posterior dominant rhythm was seen. EEG showed continuous generalized and maximal left parieto-occipital region 3 to 5 Hz theta-delta slowing. Periodic lateralized epileptiform discharges(PLEDs), maximal left parieto-occipital region were seen at 1-1.5Hz . At times the morphology of the discharges appeared more triphasic with a generalized distribution. Hyperventilation and photic stimulation were not performed.  ABNORMALITY - PLEDs, left hemisphere - Continuous slow, generalized and maximal left parieto-occipital region  IMPRESSION: This studyshowedevidence of epileptogenicity and cortical dysfunction in left hemisphere, maximal leftparieto-occipital region secondary to underlying structural abnormality. The periodic lateralized epileptiform discharges at times appear generalized with triphasic morphology at 1 to 2 Hz and around the ictal-interictal continuum.  No definite seizures were seen during the study.  [name] [name]

## 2020-08-28 NOTE — Progress Notes (Signed)
Patient ID: Lori Bridges, female   DOB: Jan 11, 1930, 86 y.o.   MRN: [id number]  This NP reviewed medical records, received report from team and then assessed patient at the bedside as a follow up to for palliative medicine needs and emotional support.  I introduced myself as a provider with the palliative medicine team.  This is an 86 year old female with a recent left temporal hemorrhage and associated seizures.  Neurology managing,  currently on Keppra, Vimpat, phenobarbital, and Perampanel.  Patient remains lethargic and unable to follow commands currently.  Cor- track remains for nutritional support.  Daughter Lori Bridges is healthcare power of attorney, there are 7 children total.  Daughter Lori Bridges is at bedside currently.    Discussed with family the importance of continued conversation with family and the medical providers regarding overall plan of care and treatment options,  ensuring decisions are within the context of the patients values and GOCs.  Family face treatment option decisions, advanced directive decisions and anticipatory care needs.  Currently family does not have any questions or concerns.    Questions and concerns addressed   Discussed with attending and bedside RN  Total time spent on the unit was 25 minutes  Greater than 50% of the time was spent in counseling and coordination of care  [name] NP  Palliative Medicine Team Team Phone # [phone] Pager [phone]

## 2020-08-29 DIAGNOSIS — E785 Hyperlipidemia, unspecified: Secondary | ICD-10-CM

## 2020-08-29 DIAGNOSIS — I480 Paroxysmal atrial fibrillation: Secondary | ICD-10-CM

## 2020-08-29 LAB — CBC WITH DIFFERENTIAL/PLATELET
Abs Immature Granulocytes: 0.1 10*3/uL — ABNORMAL HIGH (ref 0.00–0.07)
Basophils Absolute: 0 10*3/uL (ref 0.0–0.1)
Basophils Relative: 0 %
Eosinophils Absolute: 0.1 10*3/uL (ref 0.0–0.5)
Eosinophils Relative: 1 %
HCT: 40.8 % (ref 36.0–46.0)
Hemoglobin: 12.5 g/dL (ref 12.0–15.0)
Immature Granulocytes: 1 %
Lymphocytes Relative: 11 %
Lymphs Abs: 1.1 10*3/uL (ref 0.7–4.0)
MCH: 30 pg (ref 26.0–34.0)
MCHC: 30.6 g/dL (ref 30.0–36.0)
MCV: 97.8 fL (ref 80.0–100.0)
Monocytes Absolute: 1.9 10*3/uL — ABNORMAL HIGH (ref 0.1–1.0)
Monocytes Relative: 18 %
Neutro Abs: 7.3 10*3/uL (ref 1.7–7.7)
Neutrophils Relative %: 69 %
Platelets: 204 10*3/uL (ref 150–400)
RBC: 4.17 MIL/uL (ref 3.87–5.11)
RDW: 16.5 % — ABNORMAL HIGH (ref 11.5–15.5)
WBC: 10.5 10*3/uL (ref 4.0–10.5)
nRBC: 0 % (ref 0.0–0.2)

## 2020-08-29 LAB — PHOSPHORUS: Phosphorus: 2.8 mg/dL (ref 2.5–4.6)

## 2020-08-29 LAB — COMPREHENSIVE METABOLIC PANEL
ALT: 24 U/L (ref 0–44)
AST: 45 U/L — ABNORMAL HIGH (ref 15–41)
Albumin: 2.3 g/dL — ABNORMAL LOW (ref 3.5–5.0)
Alkaline Phosphatase: 144 U/L — ABNORMAL HIGH (ref 38–126)
Anion gap: 10 (ref 5–15)
BUN: 34 mg/dL — ABNORMAL HIGH (ref 8–23)
CO2: 25 mmol/L (ref 22–32)
Calcium: 9.7 mg/dL (ref 8.9–10.3)
Chloride: 104 mmol/L (ref 98–111)
Creatinine, Ser: 0.88 mg/dL (ref 0.44–1.00)
GFR calc Af Amer: >60 mL/min (ref >60)
GFR calc non Af Amer: 59 mL/min — ABNORMAL LOW (ref >60)
Glucose, Bld: 107 mg/dL — ABNORMAL HIGH (ref 70–99)
Potassium: 4.7 mmol/L (ref 3.5–5.1)
Sodium: 139 mmol/L (ref 135–145)
Total Bilirubin: 1 mg/dL (ref 0.3–1.2)
Total Protein: 5.8 g/dL — ABNORMAL LOW (ref 6.5–8.1)

## 2020-08-29 LAB — GLUCOSE, CAPILLARY
Glucose-Capillary: 111 mg/dL — ABNORMAL HIGH (ref 70–99)
Glucose-Capillary: 118 mg/dL — ABNORMAL HIGH (ref 70–99)
Glucose-Capillary: 132 mg/dL — ABNORMAL HIGH (ref 70–99)
Glucose-Capillary: 108 mg/dL — ABNORMAL HIGH (ref 70–99)
Glucose-Capillary: 124 mg/dL — ABNORMAL HIGH (ref 70–99)

## 2020-08-29 LAB — MAGNESIUM: Magnesium: 2.3 mg/dL (ref 1.7–2.4)

## 2020-08-29 MED ORDER — FUROSEMIDE 10 MG/ML IJ SOLN
80.0000 mg | Freq: Once | INTRAMUSCULAR | Status: AC
  Administered 2020-08-29: 80 mg via INTRAVENOUS
  Filled 2020-08-29: qty 8

## 2020-08-29 MED ORDER — APIXABAN 2.5 MG PO TABS
2.5000 mg | ORAL_TABLET | Freq: Two times a day (BID) | ORAL | Status: DC
  Administered 2020-08-29 – 2020-09-11 (×27): 2.5 mg
  Filled 2020-08-29 (×28): qty 1

## 2020-08-29 MED ORDER — JUVEN PO PACK
1.0000 | PACK | Freq: Two times a day (BID) | ORAL | Status: DC
  Administered 2020-08-29 – 2020-09-10 (×26): 1
  Filled 2020-08-29 (×27): qty 1

## 2020-08-29 NOTE — Progress Notes (Signed)
Patient ID: Lori Bridges, female   DOB: 10-May-1930, 86 y.o.   MRN: [id number]  PROGRESS NOTE    Lori Bridges  JSE:[id number] DOB: 04/13/1930 DOA: 08/18/2020 PCP: Derinda Late, MD    Brief Narrative:  The patient is an 86 year old elderly chronically ill-appearing female with a past medical history significant for uncontrolled hypertension who had an interest cerebral hemorrhage requiring hospitalization and subsequent rehabilitation and then was discharged home using a utility walker but was unable to get around. She was brought back to the hospital for decreased level consciousness as well as shaking her right arm. Neurology was consulted and the neurologist started antiepileptics. There is concern for airway compromise so she was transferred to the ICU for closer monitoring and there has been discussion of intubation in the 86 year old female with multiple health issues. She is admitted for acute encephalopathy secondary to seizure activity and remained deeply sedated due to recurrent seizures. She was unable to follow commands. Neurology is following and recommending antiepileptics. She had a recent MRI which showed a acute CVA.   Assessment & Plan:   Principal Problem:   Seizure ([place]) Active Problems:   Hyperlipidemia   Chronic diastolic CHF (congestive heart failure) (HCC)   ICH (intracerebral hemorrhage) (HCC) - 2 small L brain ICH, likely d/t HTN on subtherapeutic warfarin   CKD (chronic kidney disease), stage IIIa   Hypothyroidism   PAF (paroxysmal atrial fibrillation) (HCC)   Gastroesophageal reflux disease   Seizure disorder ([place])   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[place])   Pressure injury of skin  Acute encephalopathysecondary to anti-epileptics attempting to control seizure activity. Seizure History of intracerebral hemorrhage -Patient remains deeply sedated due to recurrent seizures, she is unable to follow any commands and she is mildly  tachypnic -Neurology following, appreciate assistance -C/w Keppra, Vimpat, Phenobarb, with Neurology managing; Perampanel now discontiued per neuro. -If clinical concern for sz, neuro may initiate PHT -PRN Ativan for generalized tonic clonic sz > 2 minutes or focal sz > 46minutes Seizure precautions -At risk for poor airway protection in this setting -- family would want ETT if indicated. Noted that family would not want trach -Currently has a Panda tube is getting tube feedings through that -Check labs in AM  Palliative care consult pending  Acute CVA -MRI shows "Acute subcentimeter right cerebellar infarct. 2. Decreased size of left parietal and left temporal lobe hemorrhages with mild residual edema at the former. Mild surrounding cortical signal abnormality in the left parietal lobe may reflect an associated subacute infarct and possibly recent seizure activity." -Neurology following and will discuss with Dr. Felecia Shelling   Atrial fibrillation with RVR in a patient with Permanent A Fib -Home medications include lopressor -Previously on coumadin but this has remained on hold since Development of intraparenchymal hemorrhage  -Continue oral beta blocker p.o. twice daily per tube -Continuous telemetry; remains in A Fib  -Currently on a diltiazem drip and will continue for now -if not improving will consult cardiology for further evaluation  Hypothyroidism  -Continue Synthroid 75 mcg per tube daily -TSH was 4.884  Hyperglycemia in the setting of Pre-Diabete -C/w SSI and is currently on a sensitive NovoLog sign scale every 4 hours -CBG's ranging from 100-145 -HbA1c was 5.7 in July  HTN -C/w Tele monitoring -Lasix 20 mg per Tube BID -Continue with metoprolol 75 mg per tube twice daily -Continue with labetalol 10 mg IV every 6 hours as needed for blood pressure of systolic greater than 073 or diastolic  reading 100  Thrombocytopenia -Patient's platelet count was 140 -Continue to  monitor for signs and symptoms of bleeding -Repeat CBC in a.m. up to 204  Volume overload and Acute on Chronic Diastolic CHF -Net -423NT 6/14 -C/w po Lasix -Strict I's and O's and Daily Weights  GERD/Reflux -C/w PPI 40 mg po Pantoprazole Daily PRN   DVT prophylaxis: SCD/Compression stockings Code Status: Full code  Family Communication: discussed with daughter at bedside Disposition Plan: pending clinical improvement.  Remains inpatient appropriate because:Hemodynamically unstable and IV treatments appropriate due to intensity of illness or inability to take PO  Consultants:   Neurology  PCCM   Procedures:  EEG  LTM  MRI  Antimicrobials: Anti-infectives (From admission, onward)   None       Subjective: Awakens to voice only  Objective: Vitals:   08/29/20 0200 08/29/20 0300 08/29/20 0713 08/29/20 1136  BP:   106/70 124/74  Pulse: 94 84 (!) 106 (!) 105  Resp: (!) 24 19 18  (!) 22  Temp:   98.4 F (36.9 C) 98.4 F (36.9 C)  TempSrc:   Axillary Axillary  SpO2: (!) 83% 91% 98% 98%  Weight:      Height:        Intake/Output Summary (Last 24 hours) at 08/29/2020 1618 Last data filed at 08/29/2020 0410 Gross per 24 hour  Intake --  Output 2000 ml  Net -2000 ml   Filed Weights   08/25/20 0500 08/26/20 0500 08/27/20 0414  Weight: 87.6 kg 87.4 kg 84.4 kg    Examination:  General exam: Appears calm and comfortable, awakens only voice, does answer questions, but not necessarily correctly  Respiratory system: Clear to auscultation. Mild tachypnea Cardiovascular system: S1 & S2 heard, RRR.  Gastrointestinal system: Abdomen is nondistended, soft and nontender.  Central nervous system: Alert and oriented. No focal neurological deficits. Extremities: Symmetric, significant LE edema  Skin: No rashes Psychiatry: Judgement and insight appear normal. Mood & affect appropriate.     Data Reviewed: I have personally reviewed following labs and imaging  studies  CBC: Recent Labs  Lab 08/25/20 0350 08/26/20 0343 08/27/20 0724 08/29/20 0735  WBC 6.8 6.6 9.1 10.5  NEUTROABS 5.4 4.9 6.7 7.3  HGB 13.1 13.2 13.4 12.5  HCT 41.5 41.1 40.6 40.8  MCV 96.3 96.5 92.1 97.8  PLT 166 199 140* 431   Basic Metabolic Panel: Recent Labs  Lab 08/24/20 1224 08/24/20 1224 08/24/20 1754 08/25/20 0350 08/25/20 1708 08/26/20 0343 08/27/20 0724 08/29/20 0735  NA  --   --   --  140  --  138 138 139  K  --   --   --  3.8  --  4.3 4.3 4.7  CL  --   --   --  107  --  107 105 104  CO2  --   --   --  26  --  26 25 25   GLUCOSE  --   --   --  140*  --  96 125* 107*  BUN  --   --   --  23  --  26* 31* 34*  CREATININE  --   --   --  0.85  --  0.82 0.90 0.88  CALCIUM  --   --   --  9.6  --  9.5 9.4 9.7  MG 2.1  --  2.0 2.1 2.1  --   --  2.3  PHOS 3.1   < > 2.9 2.1* 2.0*  --  2.9 2.8   < > = values in this interval not displayed.   GFR: Estimated Creatinine Clearance: 49.3 mL/min (by C-G formula based on SCr of 0.88 mg/dL). Liver Function Tests: Recent Labs  Lab 08/29/20 0735  AST 45*  ALT 24  ALKPHOS 144*  BILITOT 1.0  PROT 5.8*  ALBUMIN 2.3*   CBG: Recent Labs  Lab 08/28/20 2028 08/28/20 2331 08/29/20 0340 08/29/20 0749 08/29/20 1314  GLUCAP 111* 136* 111* 118* 132*     Recent Results (from the past 240 hour(s))  MRSA PCR Screening     Status: None   Collection Time: 08/27/20 12:07 PM   Specimen: Nasopharyngeal  Result Value Ref Range Status   MRSA by PCR NEGATIVE NEGATIVE Final    Comment:        The GeneXpert MRSA Assay (FDA approved for NASAL specimens only), is one component of a comprehensive MRSA colonization surveillance program. It is not intended to diagnose MRSA infection nor to guide or monitor treatment for MRSA infections. Performed at [facility], [place] [address] [place], [place] [address]       Radiology Studies: MR BRAIN WO CONTRAST  Result Date: 08/28/2020 CLINICAL DATA:  Seizure.  History  of ICH last month. EXAM: MRI HEAD WITHOUT CONTRAST TECHNIQUE: Multiplanar, multiecho pulse sequences of the brain and surrounding structures were obtained without intravenous contrast. COMPARISON:  Head CT 08/22/2020 and MRI 07/22/2020 FINDINGS: The study is moderately motion degraded. Brain: There is a 2.2 x 1.4 cm parenchymal hematoma in the medial left parietal lobe which is T1 and T2 hyperintense and has slightly decreased in size from the prior MRI with mild residual surrounding edema and no significant mass effect. Mild surrounding gyral T1 hyperintensity suggests laminar necrosis. There is faint cortically based trace diffusion weighted signal hyperintensity both superior and inferior to the hematoma including in the left postcentral gyrus. A small parenchymal hemorrhage in the inferolateral left temporal lobe has also decreased in size and now measures 8 mm without residual edema. No significant residual extra-axial hemorrhage is evident. There is a subcentimeter acute infarct in the right cerebellum. There is mild cerebral atrophy. Periventricular white matter T2 hyperintensities are similar to the prior MRI and are nonspecific but compatible with minimal chronic small vessel ischemic disease. There is no midline shift or extra-axial fluid collection. Vascular: Major intracranial vascular flow voids are grossly preserved. Skull and upper cervical spine: Unremarkable bone marrow signal. Sinuses/Orbits: Unremarkable orbits. Paranasal sinuses and mastoid air cells are clear. Other: None. IMPRESSION: 1. Acute subcentimeter right cerebellar infarct. 2. Decreased size of left parietal and left temporal lobe hemorrhages with mild residual edema at the former. Mild surrounding cortical signal abnormality in the left parietal lobe may reflect an associated subacute infarct and possibly recent seizure activity. Electronically Signed   By: Logan Bores M.D.   On: 08/28/2020 11:11     Scheduled Meds: . apixaban   2.5 mg Per Tube BID  . chlorhexidine  15 mL Mouth Rinse BID  . Chlorhexidine Gluconate Cloth  6 each Topical Daily  . feeding supplement (PROSource TF)  45 mL Per Tube BID  . furosemide  20 mg Per Tube BID  . insulin aspart  0-9 Units Subcutaneous Q4H  . lacosamide  100 mg Per Tube BID  . levETIRAcetam  1,000 mg Per Tube BID  . levothyroxine  75 mcg Per Tube Q0600  . mouth rinse  15 mL Mouth Rinse q12n4p  . metoprolol tartrate  75 mg  Per Tube BID  . nutrition supplement (JUVEN)  1 packet Per Tube BID BM  . PHENObarbital  60 mg Per Tube QHS   Continuous Infusions: . diltiazem (CARDIZEM) infusion Stopped (08/27/20 0303)  . feeding supplement (OSMOLITE 1.2 CAL) 1,000 mL (08/28/20 1104)     LOS: 10 days    Donnamae Jude, MD 08/29/2020 4:18 PM [phone] [facility] If 7PM-7AM, please contact night-coverage 08/29/2020, 4:18 PM

## 2020-08-29 NOTE — Progress Notes (Addendum)
Subjective: No acute events overnight.  Patient's daughter Mariann Laster at bedside states she has not witnessed any clinical seizure-like activity.  States patient appears more awake today and has been able to follow commands.  ROS: Unable to obtain due to poor mental status  Examination  Vital signs in last 24 hours: Temp:  [98.4 F (36.9 C)-98.9 F (37.2 C)] 98.4 F (36.9 C) (09/01 1136) Pulse Rate:  [83-143] 105 (09/01 1136) Resp:  [16-30] 22 (09/01 1136) BP: (105-126)/(61-94) 124/74 (09/01 1136) SpO2:  [83 %-100 %] 98 % (09/01 1136)  General: lying in bed,not in apparent distress CVS: pulse-normal rate and rhythm RS: breathing comfortably,CTA B Extremities: normal,edema Neuro:Awake, follows simple commands like sticking out her tongue, pupils equally round and reactive, midline with no gaze deviation, no apparent facial asymmetry, antigravity strength in all 4 extremities  Basic Metabolic Panel: Recent Labs  Lab 08/22/20 1550 08/22/20 1550 08/24/20 1224 08/24/20 1224 08/24/20 1754 08/25/20 0350 08/25/20 0350 08/25/20 1708 08/26/20 0343 08/27/20 0724 08/29/20 0735  NA 139  --   --   --   --  140  --   --  138 138 139  K 4.1  --   --   --   --  3.8  --   --  4.3 4.3 4.7  CL 105  --   --   --   --  107  --   --  107 105 104  CO2 24  --   --   --   --  26  --   --  26 25 25   GLUCOSE 104*  --   --   --   --  140*  --   --  96 125* 107*  BUN 18  --   --   --   --  23  --   --  26* 31* 34*  CREATININE 1.00  --   --   --   --  0.85  --   --  0.82 0.90 0.88  CALCIUM 9.6   < >  --   --   --  9.6   < >  --  9.5 9.4 9.7  MG  --   --  2.1  --  2.0 2.1  --  2.1  --   --  2.3  PHOS  --   --  3.1   < > 2.9 2.1*  --  2.0*  --  2.9 2.8   < > = values in this interval not displayed.    CBC: Recent Labs  Lab 08/25/20 0350 08/26/20 0343 08/27/20 0724 08/29/20 0735  WBC 6.8 6.6 9.1 10.5  NEUTROABS 5.4 4.9 6.7 7.3  HGB 13.1 13.2 13.4 12.5  HCT 41.5 41.1 40.6 40.8  MCV 96.3 96.5  92.1 97.8  PLT 166 199 140* 204     Coagulation Studies: No results for input(s): LABPROT, INR in the last 72 hours.  Imaging No new brain imaging overnight  ASSESSMENT AND PLAN: 86 year-old female with recent left temporal hemorrhage (07/22/2020) and associated seizures who presented withfocal motorstatus epilepticus, now resolved, but with continuedevidence of epileptogenicity and cortical dysfunction intheleft hemisphere.  Focal epilepsy with breakthrough seizures Recentleft temporal ICH (July 2021) Acute encephalopathy, due to seizures and medications (improving) Acute right cerebellar infarct and subacute left parietal infarct -No further seizures overnight.  Mentation is improving and now patient is able to open her eyes and follow simple commands.   Recommendations: - Continuethe followingAEDs:  1.Keppra 1000 mg twice daily 2.Vimpat 100 mg twice daily 3.Phenobarb 65 mg nightly -If patient has any breakthrough seizures, can increase Vimpat to 150 mg twice daily -Discussed management of acute coincidental stroke on MRI with Dr. Leonie Man.  Recommended starting Eliquis due to suspected cardioembolic stroke in setting of atrial fibrillation, not on anticoagulation.  Patient had ICH more than a month ago which possibly could have been acute ischemic infarct with hemorrhagic conversion per Dr. Leonie Man.  On review of her labs, at that time she was on warfarin with widely fluctuating INR.  Therefore would recommend starting Eliquis 2.5 mg twice daily at this time.  Patient has been intolerant to multiple statins in the past therefore will hold off starting statins. - As needed IV Ativan 2 mg for clinical seizure-like activity. Please be cautious when administering Ativan to minimize excessive sedation - Social Development worker, community is checking the cost of Vimpat as well as Eliquis in preparation for patient's discharge eventually. If Vimpat is cost prohibitive, will consider switching  to Depakote or oxcarbazepine.  If Eliquis is cost prohibitive, will likely have to start on aspirin 81 mg daily. - Management of rest of comorbidities per primary team - Discussed management plan in detail with patient's daughter Mariann Laster at bedside  I have spent a total of 35  minuteswith the patient reviewing hospitalnotes,  test results, labs and examining the patient as well as establishing an assessment and plan that was discussed personally with the patient's daughter Mariann Laster at bedside.>50% of time was spent in direct patient care.  Zeb Comfort Epilepsy [facility] For questions after 5pm please refer to AMION to reach the Neurologist on call

## 2020-08-29 NOTE — Progress Notes (Signed)
Nutrition Follow-up  DOCUMENTATION CODES:   Not applicable  INTERVENTION:  -Continue tube feeding via Cortrak: Osmolite 1.2 at 55 ml/h (1320 ml per day) Prosource TF 45 ml BID  Provides 1664 kcal, 95 gm protein, 1070 ml free water daily  -1 packet Juven BID via Cortrak, each packet provides 95 calories, 2.5 grams of protein (collagen), and 9.8 grams of carbohydrate (3 grams sugar); also contains 7 grams of L-arginine and L-glutamine, 300 mg vitamin C, 15 mg vitamin E, 1.2 mcg vitamin B-12, 9.5 mg zinc, 200 mg calcium, and 1.5 g  Calcium Beta-hydroxy-Beta-methylbutyrate to support wound healing   NUTRITION DIAGNOSIS:   Inadequate oral intake related to inability to eat as evidenced by NPO status.  Ongoing  GOAL:   Patient will meet greater than or equal to 90% of their needs  Met with TF  MONITOR:   TF tolerance, Diet advancement  REASON FOR ASSESSMENT:   Consult Enteral/tube feeding initiation and management  ASSESSMENT:   Pt with PMH of uncontrolled HTN who recently was hospitalized with a ICH. Pt was discharged home after inpatient rehab stay. Now admitted with seizures.  8/27 Cortrak placed (gastric)  Pt remains minimally responsive.  Per RN assessment, pt with deep pitting edema to all extremities and generalized. Pt also with moderate pitting edema to perineum.  Pt continues to receive TF via Cortrak. Current orders: Osmolite 1.2 cal @ 63m/hr, 481mProsource TF BID.  This provides 1664 kcal, 95 gm protein, 1070 ml free water daily  Labs: CBGs 111-136 Medications: Novolog, Lasix  Diet Order:   Diet Order            Diet NPO time specified  Diet effective now                 EDUCATION NEEDS:   No education needs have been identified at this time  Skin:  Skin Assessment: Skin Integrity Issues: Skin Integrity Issues:: Stage I Stage I: buttocks  Last BM:  8/31  Height:   Ht Readings from Last 1 Encounters:  08/19/20 _0  (1.702 m)     Weight:   Wt Readings from Last 1 Encounters:  08/27/20 84.4 kg    Ideal Body Weight:  59.1 kg  BMI:  Body mass index is 29.14 kg/m.  Estimated Nutritional Needs:   Kcal:  1550-1750  Protein:  80-95 grams  Fluid:  >1.5 L/day    AmLarkin InaMS, RD, LDN RD pager number and weekend/on-call pager number located in [employer] [place]

## 2020-08-29 NOTE — TOC Benefit Eligibility Note (Signed)
Transition of Care [facility]) Benefit Eligibility Note    Patient Details  Name: Lori Bridges MRN: [id number] Date of Birth: [birth year]   Medication/Dose: VIMPAT  100 MG BID                       Additional Notes: NO PHARMACY BENEFIT ON FILE !    Memory Argue Phone Number: 08/29/2020, 10:15 AM

## 2020-08-30 DIAGNOSIS — G9340 Encephalopathy, unspecified: Secondary | ICD-10-CM

## 2020-08-30 DIAGNOSIS — I5033 Acute on chronic diastolic (congestive) heart failure: Secondary | ICD-10-CM

## 2020-08-30 DIAGNOSIS — I639 Cerebral infarction, unspecified: Secondary | ICD-10-CM

## 2020-08-30 DIAGNOSIS — L989 Disorder of the skin and subcutaneous tissue, unspecified: Secondary | ICD-10-CM

## 2020-08-30 DIAGNOSIS — E8770 Fluid overload, unspecified: Secondary | ICD-10-CM

## 2020-08-30 DIAGNOSIS — Z7189 Other specified counseling: Secondary | ICD-10-CM

## 2020-08-30 DIAGNOSIS — D696 Thrombocytopenia, unspecified: Secondary | ICD-10-CM

## 2020-08-30 DIAGNOSIS — R739 Hyperglycemia, unspecified: Secondary | ICD-10-CM

## 2020-08-30 DIAGNOSIS — I130 Hypertensive heart and chronic kidney disease with heart failure and stage 1 through stage 4 chronic kidney disease, or unspecified chronic kidney disease: Secondary | ICD-10-CM

## 2020-08-30 DIAGNOSIS — Z515 Encounter for palliative care: Secondary | ICD-10-CM

## 2020-08-30 DIAGNOSIS — I4891 Unspecified atrial fibrillation: Secondary | ICD-10-CM

## 2020-08-30 DIAGNOSIS — R4182 Altered mental status, unspecified: Secondary | ICD-10-CM

## 2020-08-30 LAB — COMPREHENSIVE METABOLIC PANEL
ALT: 29 U/L (ref 0–44)
AST: 60 U/L — ABNORMAL HIGH (ref 15–41)
Albumin: 2.5 g/dL — ABNORMAL LOW (ref 3.5–5.0)
Alkaline Phosphatase: 171 U/L — ABNORMAL HIGH (ref 38–126)
Anion gap: 8 (ref 5–15)
BUN: 35 mg/dL — ABNORMAL HIGH (ref 8–23)
CO2: 29 mmol/L (ref 22–32)
Calcium: 10 mg/dL (ref 8.9–10.3)
Chloride: 101 mmol/L (ref 98–111)
Creatinine, Ser: 0.86 mg/dL (ref 0.44–1.00)
GFR calc Af Amer: >60 mL/min (ref >60)
GFR calc non Af Amer: >60 mL/min (ref >60)
Glucose, Bld: 119 mg/dL — ABNORMAL HIGH (ref 70–99)
Potassium: 4.7 mmol/L (ref 3.5–5.1)
Sodium: 138 mmol/L (ref 135–145)
Total Bilirubin: 1 mg/dL (ref 0.3–1.2)
Total Protein: 6.2 g/dL — ABNORMAL LOW (ref 6.5–8.1)

## 2020-08-30 LAB — GLUCOSE, CAPILLARY
Glucose-Capillary: 105 mg/dL — ABNORMAL HIGH (ref 70–99)
Glucose-Capillary: 112 mg/dL — ABNORMAL HIGH (ref 70–99)
Glucose-Capillary: 115 mg/dL — ABNORMAL HIGH (ref 70–99)
Glucose-Capillary: 121 mg/dL — ABNORMAL HIGH (ref 70–99)
Glucose-Capillary: 129 mg/dL — ABNORMAL HIGH (ref 70–99)
Glucose-Capillary: 149 mg/dL — ABNORMAL HIGH (ref 70–99)
Glucose-Capillary: 151 mg/dL — ABNORMAL HIGH (ref 70–99)

## 2020-08-30 MED ORDER — METOPROLOL TARTRATE 5 MG/5ML IV SOLN
5.0000 mg | Freq: Once | INTRAVENOUS | Status: AC
  Administered 2020-08-30: 5 mg via INTRAVENOUS
  Filled 2020-08-30: qty 5

## 2020-08-30 NOTE — Progress Notes (Signed)
PROGRESS NOTE    Lori Bridges  GLO:[id number] DOB: 04/25/1930 DOA: 08/18/2020 PCP: Derinda Late, MD    Brief Narrative:   The patient is an 86 year old elderly chronically ill-appearing female with a past medical history significant for uncontrolled hypertension who had an interest cerebral hemorrhage requiring hospitalization and subsequent rehabilitation and then was discharged home using a utility walker but was unable to get around. She was brought back to the hospital for decreased level consciousness as well as shaking her right arm. Neurology was consulted and the neurologist started antiepileptics. There is concern for airway compromise so she was transferred to the ICU for closer monitoring and there has been discussion of intubation in the 86 year old female with multiple health issues. She is admitted for acute encephalopathy secondary to seizure activity and remained deeply sedated due to recurrent seizures. She was unable to follow commands. Neurology is following and recommending antiepileptics. She had a recent MRI which showed a acute CVA.  Assessment & Plan:   Principal Problem:   Seizure ([place]) Active Problems:   Hyperlipidemia   Chronic diastolic CHF (congestive heart failure) (HCC)   ICH (intracerebral hemorrhage) (HCC) - 2 small L brain ICH, likely d/t HTN on subtherapeutic warfarin   CKD (chronic kidney disease), stage IIIa   Hypothyroidism   PAF (paroxysmal atrial fibrillation) (HCC)   Gastroesophageal reflux disease   Seizure disorder ([place])   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[place])   Pressure injury of skin  Acute encephalopathysecondary to anti-epileptics attempting to control seizure activity. Seizure History of intracerebral hemorrhage -Patient remains deeply sedated due to recurrent seizures, she is unable to follow any commands and she is mildly tachypnic -Neurology following, appreciate assistance -C/wKeppra, Eads, Combine  Neurology managing;Perampanel now discontiuedper neuro. -If clinical concern for sz, neuro may initiate PHT -PRN Ativan for generalized tonic clonic sz > 2 minutes or focal sz > 26minutes Seizure precautions -At risk for poor airway protection in this setting -- family would want ETT if indicated. Noted that family would not want trach -Currently has a Panda tube is getting tube feedings through that -Check labs in AM  Palliative care consult pending  Acute CVA -MRI shows "Acute subcentimeter right cerebellar infarct. 2. Decreased size of left parietal and left temporal lobe hemorrhages with mild residual edema at the former. Mild surrounding cortical signal abnormality in the left parietal lobe may reflect an associated subacute infarct and possibly recent seizure activity." -Neurology following and will discuss with Dr. Felecia Shelling  Atrial fibrillation withRVR in a patient with Permanent A Fib -Home medications include lopressor -Previously on coumadin but this has remained on hold since Development of intraparenchymal hemorrhage -Continue oral beta blockerp.o. twice daily per tube -Continuous telemetry; remains in A Fib  -Currently on a diltiazem drip and will continue for now -if not improving will consult cardiology for further evaluation  Hypothyroidism -Continue Synthroid75 mcg per tube daily -TSH was 4.884  Hyperglycemiain the setting of Pre-Diabete -C/wSSIand is currently on a sensitive NovoLog sign scale every 4 hours -CBG's ranging from 100-145 -HbA1c was 5.7 in July  HTN -C/wTele monitoring -Lasix20 mg per Tube BID -Continue with metoprolol 75 mg per tube twice daily -Continue with labetalol 10 mg IV every 6 hours as needed for blood pressure of systolic greater than 188 or diastolic reading 416  Thrombocytopenia -Patient's platelet count was 140 -Continue to monitor for signs and symptoms of bleeding -Repeat CBC in a.m. up to 204  Volume  overloadand Acute on Chronic  Diastolic CHF -Net -076KG 8/81 -C/w poLasix -Strict I's and O's and Daily Weights  GERD/Reflux -C/w PPI 40 mg po Pantoprazole Daily PRN   DVT prophylaxis: SCD/Compression stockings Code Status: Full code  Family Communication:  Disposition Plan: pending clinical improvement   Remains inpatient appropriate because:Hemodynamically unstable and IV treatments appropriate due to intensity of illness or inability to take PO  Consultants:   Neurology  PCCM  Palliative Care  Procedures:  EEG  LTM  MRI  Antimicrobials: Anti-infectives (From admission, onward)   None       Subjective: Arouses to voice only  Objective: Vitals:   08/30/20 0330 08/30/20 0500 08/30/20 0800 08/30/20 0940  BP: 115/84  124/90 135/90  Pulse: 98 (!) 102 90 80  Resp: (!) 22 16 20 20   Temp: 98 F (36.7 C)  98.2 F (36.8 C) 98.4 F (36.9 C)  TempSrc: Oral  Oral Axillary  SpO2: 98% 97% 97% 98%  Weight:      Height:        Intake/Output Summary (Last 24 hours) at 08/30/2020 1122 Last data filed at 08/30/2020 1031 Gross per 24 hour  Intake 1678.42 ml  Output 3800 ml  Net -2121.58 ml   Filed Weights   08/25/20 0500 08/26/20 0500 08/27/20 0414  Weight: 87.6 kg 87.4 kg 84.4 kg    Examination:  General exam: Appears calm and comfortable  Respiratory system: Clear to auscultation. Respiratory effort normal. Cardiovascular system: S1 & S2 heard, RRR.  Gastrointestinal system: Abdomen is nondistended, soft and nontender.  Central nervous system: Alert and oriented. No focal neurological deficits. Extremities: Symmetric  Skin: No rashes Psychiatry: Judgement and insight appear normal. Mood & affect appropriate.   Data Reviewed: I have personally reviewed following labs and imaging studies  CBC: Recent Labs  Lab 08/25/20 0350 08/26/20 0343 08/27/20 0724 08/29/20 0735  WBC 6.8 6.6 9.1 10.5  NEUTROABS 5.4 4.9 6.7 7.3  HGB 13.1 13.2 13.4 12.5  HCT  41.5 41.1 40.6 40.8  MCV 96.3 96.5 92.1 97.8  PLT 166 199 140* 594   Basic Metabolic Panel: Recent Labs  Lab 08/24/20 1224 08/24/20 1224 08/24/20 1754 08/25/20 0350 08/25/20 1708 08/26/20 0343 08/27/20 0724 08/29/20 0735 08/30/20 0356  NA  --   --   --  140  --  138 138 139 138  K  --   --   --  3.8  --  4.3 4.3 4.7 4.7  CL  --   --   --  107  --  107 105 104 101  CO2  --   --   --  26  --  26 25 25 29   GLUCOSE  --   --   --  140*  --  96 125* 107* 119*  BUN  --   --   --  23  --  26* 31* 34* 35*  CREATININE  --   --   --  0.85  --  0.82 0.90 0.88 0.86  CALCIUM  --   --   --  9.6  --  9.5 9.4 9.7 10.0  MG 2.1  --  2.0 2.1 2.1  --   --  2.3  --   PHOS 3.1   < > 2.9 2.1* 2.0*  --  2.9 2.8  --    < > = values in this interval not displayed.   GFR: Estimated Creatinine Clearance: 50.5 mL/min (by C-G formula based on SCr of 0.86 mg/dL). Liver Function  Tests: Recent Labs  Lab 08/29/20 0735 08/30/20 0356  AST 45* 60*  ALT 24 29  ALKPHOS 144* 171*  BILITOT 1.0 1.0  PROT 5.8* 6.2*  ALBUMIN 2.3* 2.5*   CBG: Recent Labs  Lab 08/29/20 1636 08/29/20 2022 08/30/20 0003 08/30/20 0337 08/30/20 0807  GLUCAP 108* 124* 149* 115* 129*    Recent Results (from the past 240 hour(s))  MRSA PCR Screening     Status: None   Collection Time: 08/27/20 12:07 PM   Specimen: Nasopharyngeal  Result Value Ref Range Status   MRSA by PCR NEGATIVE NEGATIVE Final    Comment:        The GeneXpert MRSA Assay (FDA approved for NASAL specimens only), is one component of a comprehensive MRSA colonization surveillance program. It is not intended to diagnose MRSA infection nor to guide or monitor treatment for MRSA infections. Performed at [facility], [place] [address], [place],  [address]       Radiology Studies: No results found.   Scheduled Meds: . apixaban  2.5 mg Per Tube BID  . chlorhexidine  15 mL Mouth Rinse BID  . Chlorhexidine Gluconate Cloth  6 each Topical  Daily  . feeding supplement (PROSource TF)  45 mL Per Tube BID  . furosemide  20 mg Per Tube BID  . insulin aspart  0-9 Units Subcutaneous Q4H  . lacosamide  100 mg Per Tube BID  . levETIRAcetam  1,000 mg Per Tube BID  . levothyroxine  75 mcg Per Tube Q0600  . mouth rinse  15 mL Mouth Rinse q12n4p  . metoprolol tartrate  75 mg Per Tube BID  . nutrition supplement (JUVEN)  1 packet Per Tube BID BM  . PHENObarbital  60 mg Per Tube QHS   Continuous Infusions: . diltiazem (CARDIZEM) infusion Stopped (08/27/20 0303)  . feeding supplement (OSMOLITE 1.2 CAL) 1,000 mL (08/28/20 1104)     LOS: 11 days    Donnamae Jude, MD 08/30/2020 11:22 AM [phone] [facility] If 7PM-7AM, please contact night-coverage 08/30/2020, 11:22 AM

## 2020-08-30 NOTE — Discharge Instructions (Addendum)

## 2020-08-30 NOTE — Progress Notes (Signed)
Patient ID: Lori Bridges, female   DOB: December 09, 1930, 86 y.o.   MRN: [id number]  This NP reviewed medical records, received report from team and then assessed patient at the bedside as a follow up to for palliative medicine needs and emotional support.  Is lethargic and minimally responsive to vigorous tactile stimulation and verbal stimuli.  She is unable to support herself from a nutrition or hydration standpoint and mains on core track for nutritional support.  I spoke by phone with daughter Lori Bridges who is healthcare power of attorney,   Continue conversation regarding current medical situation; patient remains encephalopathic secondary to history of intracranial hemorrhage, seizure activity and antiepileptic's attempting to control seizure activity.  Patient with other comorbidities of acute on chronic diastolic congestive heart failure, thrombocytopenia, hypertension, GERD, hypothyroid, and she is with advanced age 25 years old.  Today is day 11 of this hospitalization  Family face ongoing treatment option decisions, advanced directive decisions, and anticipatory care needs.   Education was offered today regarding advanced directives.  Concepts specific to code status, artifical feeding and hydration, continued IV antibiotics and rehospitalization was had.  The difference between a aggressive medical intervention path  and a palliative comfort care path for this patient at this time was had.  Values and goals of care important to patient and family were attempted to be elicited.  Natural trajectory and expectations at EOL were discussed.     Education offered to daughter the fact that if family chooses to shift to a full comfort path patient would likely be residential hospice eligible with a prognosis of less than a few weeks.   [name] verbalizes appreciation for today's conversation she  plans to talk with other family members before making decisions regarding plan of care.   Discussed with  family the importance of continued conversation with family and the medical providers regarding overall plan of care and treatment options,  ensuring decisions are within the context of the patients values and GOCs.  Discussed with  Dr Kennon Rounds and [name]  Total time spent on the unit was 35 minutes  Greater than 50% of the time was spent in counseling and coordination of care  [name] NP  Palliative Medicine Team Team Phone # [phone] Pager [phone]

## 2020-08-30 NOTE — Progress Notes (Addendum)
Subjective: No acute events overnight.  Patient continues to be significantly lethargic, only moaning to repeated tactile stimuli.  ROS: Unable to obtain due to poor mental status  Examination  Vital signs in last 24 hours: Temp:  [97.8 F (36.6 C)-100.1 F (37.8 C)] 97.8 F (36.6 C) (09/02 1100) Pulse Rate:  [63-127] 96 (09/02 1100) Resp:  [16-23] 23 (09/02 1100) BP: (106-135)/(60-90) 115/71 (09/02 1100) SpO2:  [90 %-98 %] 95 % (09/02 1433)  General: lying in bed,not in apparent distress CVS: pulse-normal rate and rhythm RS: breathing comfortably,CTAB Extremities: normal,edema Neuro:Only moans to tactile stimuli, not following commands, pupils equally round and reactive, midline with no gaze deviation, no apparent facial asymmetry, antigravity strength in all 4 extremities  Basic Metabolic Panel: Recent Labs  Lab 08/24/20 1224 08/24/20 1224 08/24/20 1754 08/25/20 0350 08/25/20 0350 08/25/20 1708 08/26/20 0343 08/26/20 0343 08/27/20 0724 08/29/20 0735 08/30/20 0356  NA  --   --   --  140  --   --  138  --  138 139 138  K  --   --   --  3.8  --   --  4.3  --  4.3 4.7 4.7  CL  --   --   --  107  --   --  107  --  105 104 101  CO2  --   --   --  26  --   --  26  --  25 25 29   GLUCOSE  --   --   --  140*  --   --  96  --  125* 107* 119*  BUN  --   --   --  23  --   --  26*  --  31* 34* 35*  CREATININE  --   --   --  0.85  --   --  0.82  --  0.90 0.88 0.86  CALCIUM  --   --   --  9.6   < >  --  9.5   < > 9.4 9.7 10.0  MG 2.1  --  2.0 2.1  --  2.1  --   --   --  2.3  --   PHOS 3.1   < > 2.9 2.1*  --  2.0*  --   --  2.9 2.8  --    < > = values in this interval not displayed.    CBC: Recent Labs  Lab 08/25/20 0350 08/26/20 0343 08/27/20 0724 08/29/20 0735  WBC 6.8 6.6 9.1 10.5  NEUTROABS 5.4 4.9 6.7 7.3  HGB 13.1 13.2 13.4 12.5  HCT 41.5 41.1 40.6 40.8  MCV 96.3 96.5 92.1 97.8  PLT 166 199 140* 204     Coagulation Studies: No results for input(s):  LABPROT, INR in the last 72 hours.  Imaging No new brain imaging overnight  ASSESSMENT AND PLAN: 86 year-old female with recent left temporal hemorrhage (07/22/2020) and associated seizures who presented withfocal motorstatus epilepticus, now resolved, but with continuedevidence of epileptogenicity and cortical dysfunction intheleft hemisphere.  Focal epilepsy with breakthrough seizures Recentleft temporal ICH (July 2021) Acute encephalopathy, due to seizures and medications (improving) Acute right cerebellar infarct and subacute left parietal infarct -No further seizures overnight.  Patient was slightly more awake and following commands yesterday but appears very drowsy today.  Could be fluctuation mental status, delirium, iatrogenic.  Recommendations: - Continuethe followingAEDs: 1.Keppra 1000 mg twice daily 2.Vimpat 100 mg twice daily 3.Phenobarb 65 mg  nightly -If patient has any breakthrough seizures, can increase Vimpat to 150 mg twice daily -If patient continues to be excessively drowsy tomorrow, can consider reducing AED -Continue Eliquis 2.5 mg twice daily. - As needed IV Ativan 2 mg for clinical seizure-like activity. Please be cautious when administering Ativan to minimize excessive sedation -Seizure precautions, delirium precautions, minimize sedating medications - Management of rest of comorbidities per primary team  I have spent a total of51minuteswith the patient reviewing hospitalnotes, test results, labs and examining the patient as well as establishing an assessment and plan that was discussed personally with the patient'sphysician Dr Pratt.>50% of time was spent in direct patient care.  Zeb Comfort Epilepsy [facility] For questions after 5pm please refer to AMION to reach the Neurologist on call

## 2020-08-30 NOTE — Progress Notes (Signed)
Physical Therapy Treatment Patient Details Name: Lori Bridges MRN: [id number] DOB: 26-Aug-1930 Today's Date: 08/30/2020    History of Present Illness Lori Bridges is a 86 y.o. female with medical history significant of paroxysmal A. fib, chronic diastolic CHF, hypertension, hyperlipidemia, GERD, recent ICH, degenerative disc disease, CKD stage IIIa presents to emergency department with right-sided seizure-like activity. Pt just returned home on 8/19 from CIR s/p stroke affecting right side.    PT Comments    RN reports pt was alert this morning and currently much more sedated due to pt received seizure medications ~11:00 a.m. Patient would only briefly arouse with moderate stimulation and quickly again close her eyes. She did arouse enough to demonstrate ability to follow commands for open mouth, stick out tongue, and wiggle fingers. Focused primarily on upright chair position with PROM to neck and rt extremities (displaying flexor tone). Positioned onto left side to promote neck left lateral flexion and rotation.     Follow Up Recommendations  SNF     Equipment Recommendations  None recommended by PT    Recommendations for Other Services       Precautions / Restrictions Precautions Precautions: Fall Precaution Comments: edematous t/o body    Mobility  Bed Mobility Overal bed mobility: Needs Assistance Bed Mobility: Rolling Rolling: Total assist;+2 for physical assistance         General bed mobility comments: pt elevated to chair-like position with covers removed and hand-over-hand assist to wipe her face with cool washcloth; she would briefly open her eyes and follow one step commands (oral motor, eye opening, left hand) and immediately falls asleep  Transfers                    Ambulation/Gait                 Stairs             Wheelchair Mobility    Modified Rankin (Stroke Patients Only) Modified Rankin (Stroke Patients Only) Pre-Morbid  Rankin Score: Moderately severe disability Modified Rankin: Severe disability     Balance                                            Cognition Arousal/Alertness: Lethargic Behavior During Therapy: Flat affect Overall Cognitive Status: Difficult to assess Area of Impairment: Following commands                   Current Attention Level: Focused   Following Commands: Follows one step commands inconsistently       General Comments: despite lethargy, pt did open mouth, wiggle fingers and ttry to turn her head to the left (repeated each at least x2)      Exercises General Exercises - Lower Extremity Ankle Circles/Pumps: 5 reps;Left;PROM;Right;Supine Other Exercises Other Exercises: left cervical rotation and lateral flexion x 5 reps each (with prolonged hold); repositioned on her left side with towel roll inside pillowcase to maintain head in midline Other Exercises: RUE elbow flexion and extension (incr flexor tone); RLE in flexor synergy with incr tone and required slow stretching to achieve neutral position; supported in neutral by blanket roll    General Comments        Pertinent Vitals/Pain Pain Assessment: Faces Faces Pain Scale: Hurts even more Pain Location: RLE when extended out of flexor synergy pattern    Home Living  Prior Function            PT Goals (current goals can now be found in the care plan section) Acute Rehab PT Goals Patient Stated Goal: family would like her home Time For Goal Achievement: 09/10/20 Potential to Achieve Goals: Good Progress towards PT goals: Not progressing toward goals - comment (lethargy)    Frequency    Min 2X/week      PT Plan Current plan remains appropriate    Co-evaluation              AM-PAC PT "6 Clicks" Mobility   Outcome Measure  Help needed turning from your back to your side while in a flat bed without using bedrails?: Total Help needed  moving from lying on your back to sitting on the side of a flat bed without using bedrails?: Total Help needed moving to and from a bed to a chair (including a wheelchair)?: Total Help needed standing up from a chair using your arms (e.g., wheelchair or bedside chair)?: Total Help needed to walk in hospital room?: Total Help needed climbing 3-5 steps with a railing? : Total 6 Click Score: 6    End of Session   Activity Tolerance: Patient limited by lethargy Patient left: in bed;with call bell/phone within reach;with bed alarm set   PT Visit Diagnosis: Muscle weakness (generalized) (M62.81);Difficulty in walking, not elsewhere classified (R26.2)     Time: 7062-3762 PT Time Calculation (min) (ACUTE ONLY): 28 min  Charges:  $Therapeutic Exercise: 23-37 mins                      Arby Barrette, PT Pager [phone]    Rexanne Mano 08/30/2020, 3:18 PM

## 2020-08-31 LAB — GLUCOSE, CAPILLARY
Glucose-Capillary: 104 mg/dL — ABNORMAL HIGH (ref 70–99)
Glucose-Capillary: 110 mg/dL — ABNORMAL HIGH (ref 70–99)
Glucose-Capillary: 117 mg/dL — ABNORMAL HIGH (ref 70–99)
Glucose-Capillary: 119 mg/dL — ABNORMAL HIGH (ref 70–99)
Glucose-Capillary: 113 mg/dL — ABNORMAL HIGH (ref 70–99)

## 2020-08-31 MED ORDER — ALBUMIN HUMAN 25 % IV SOLN
25.0000 g | Freq: Four times a day (QID) | INTRAVENOUS | Status: AC
  Administered 2020-08-31 – 2020-09-02 (×8): 25 g via INTRAVENOUS
  Filled 2020-08-31 (×8): qty 100

## 2020-08-31 MED ORDER — FUROSEMIDE 10 MG/ML IJ SOLN
40.0000 mg | Freq: Two times a day (BID) | INTRAMUSCULAR | Status: DC
  Administered 2020-08-31 – 2020-09-02 (×4): 40 mg via INTRAVENOUS
  Filled 2020-08-31 (×5): qty 4

## 2020-08-31 MED ORDER — LACOSAMIDE 50 MG PO TABS
50.0000 mg | ORAL_TABLET | Freq: Two times a day (BID) | ORAL | Status: DC
  Administered 2020-08-31 – 2020-09-03 (×6): 50 mg
  Filled 2020-08-31 (×6): qty 1

## 2020-08-31 NOTE — Progress Notes (Signed)
PROGRESS NOTE    Lori Bridges  NGE:[id number] DOB: 1930-12-20 DOA: 08/18/2020 PCP: Derinda Late, MD    Brief Narrative:   The patient is an 86 year old elderly chronically ill-appearing female with a past medical history significant for uncontrolled hypertension who had a recent cerebral hemorrhage requiring hospitalization and subsequent rehabilitation and then was discharged home using a utility walker but was unable to get around. She was brought back to the hospital due to decreased level of consciousness as well as shaking her right arm. Neurology was consulted and the neurologist started antiepileptics. There was concern for airway compromise so she was transferred to the ICU for closer monitoring and there were discussions regarding possible intubation considering multiple health issues. Patient was admitted with acute encephalopathy secondary to seizure activity and remained deeply sedated due to recurrent seizures. She was unable to follow commands. Neurology is following and recommending antiepileptics. She had a recent MRI which showed an acute CVA.  Assessment & Plan:   Principal Problem:   Seizure ([place]) Active Problems:   Hyperlipidemia   Chronic diastolic CHF (congestive heart failure) (HCC)   ICH (intracerebral hemorrhage) (HCC) - 2 small L brain ICH, likely d/t HTN on subtherapeutic warfarin   CKD (chronic kidney disease), stage IIIa   Hypothyroidism   PAF (paroxysmal atrial fibrillation) (HCC)   DNR (do not resuscitate) discussion   Gastroesophageal reflux disease   Seizure disorder ([place])   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[place])   Pressure injury of skin   Palliative care by specialist   Altered mental status  Acute encephalopathysecondary to anti-epileptics attempting to control seizure activity. Seizure History of intracerebral hemorrhage -Patient remains deeply sedated due to recurrent seizures, she is unable to follow any commands and she  is mildly tachypnic -Neurology following, appreciate assistance -C/wKeppra, [place], [place] [facility] managing;Perampanel now discontiuedper neuro. -If clinical concern for sz, neuro may initiate PHT -PRN Ativan for generalized tonic clonic sz > 2 minutes or focal sz > 16minutes Seizure precautions -At risk for poor airway protection in this setting -- family would want ETT if indicated. Noted that family would not want trach -Currently has a Panda tube is getting tube feedings through that -Check labs in AM  08/31/2020: Seen alongside patient's daughter.  Patient is able to follow commands today.  Patient opens eyes on command.  Patient is also able to squeeze my hands on commands.  Neurology input is appreciated.  Antiseizure medication has been adjusted accordingly.  Acute CVA -MRI shows "Acute subcentimeter right cerebellar infarct. 2. Decreased size of left parietal and left temporal lobe hemorrhages with mild residual edema at the former. Mild surrounding cortical signal abnormality in the left parietal lobe may reflect an associated subacute infarct and possibly recent seizure activity." -Neurology following and will discuss with Dr. Felecia Shelling 08/31/2020: Patient is weak on the right side.  Patient does not move the right lower extremity.  Atrial fibrillation withRVR in a patient with Permanent A Fib -Home medications include lopressor -Previously on coumadin but this has remained on hold since Development of intraparenchymal hemorrhage -Continue oral beta blockerp.o. twice daily per tube -Continuous telemetry; remains in A Fib  -Currently on a diltiazem drip and will continue for now -if not improving will consult cardiology for further evaluation 09/27/2020: Seems stable for now.  Continue to manage expectantly.  Hypothyroidism -Continue Synthroid75 mcg per tube daily -TSH was 4.884  Hyperglycemiain the setting of Pre-Diabete -C/wSSIand is currently on a  sensitive NovoLog sign scale every  4 hours -CBG's ranging from 100-145 -HbA1c was 5.7 in July  HTN -C/wTele monitoring -Lasix20 mg per Tube BID -Continue with metoprolol 75 mg per tube twice daily -Continue with labetalol 10 mg IV every 6 hours as needed for blood pressure of systolic greater than 741 or diastolic reading 287  Thrombocytopenia -Patient's platelet count was 140 -Continue to monitor for signs and symptoms of bleeding -Repeat CBC in a.m. up to 204  Volume overloadand Acute on Chronic Diastolic CHF -Net -867EH 2/09 -C/w poLasix -Strict I's and O's and Daily Weights  GERD/Reflux -C/w PPI 40 mg po Pantoprazole Daily PRN   DVT prophylaxis: SCD/Compression stockings Code Status: Full code  Family Communication:  Disposition Plan: pending clinical improvement   Remains inpatient appropriate because:Hemodynamically unstable and IV treatments appropriate due to intensity of illness or inability to take PO  Consultants:   Neurology  PCCM  Palliative Care  Procedures:  EEG  LTM  MRI  Antimicrobials: Anti-infectives (From admission, onward)   None      Subjective: Follows command, though, sluggishly.   No history from patient.  Objective: Vitals:   08/30/20 2316 08/31/20 0323 08/31/20 0325 08/31/20 0728  BP: 114/71   118/69  Pulse: 99 (!) 109  97  Resp: 19 17  16   Temp: 98.6 F (37 C) 98.1 F (36.7 C)  98.4 F (36.9 C)  TempSrc: Axillary Oral  Axillary  SpO2: 97% 97%  96%  Weight:   88.6 kg   Height:        Intake/Output Summary (Last 24 hours) at 08/31/2020 1108 Last data filed at 08/31/2020 0539 Gross per 24 hour  Intake 1319 ml  Output 825 ml  Net 494 ml   Filed Weights   08/26/20 0500 08/27/20 0414 08/31/20 0325  Weight: 87.4 kg 84.4 kg 88.6 kg    Examination:  General exam: Not in distress.  Significant edema of the legs.  Response to voice commands.  Ability hands. Respiratory system: Clear to auscultation.   Cardiovascular system: S1 & S2 heard Gastrointestinal system: Abdomen is obese, soft and nontender.  Organs are difficult assess. Central nervous system: Patient seems to be more responsive.  Patient is following simple voice commands.  Patient is able to squeeze my fingers.  Patient is weak on the right side, particularly, the right lower extremity (patient barely moves right lower extremity). Extremities: 2+ to 3 bilateral leg edema.  Data Reviewed: I have personally reviewed following labs and imaging studies  CBC: Recent Labs  Lab 08/25/20 0350 08/26/20 0343 08/27/20 0724 08/29/20 0735  WBC 6.8 6.6 9.1 10.5  NEUTROABS 5.4 4.9 6.7 7.3  HGB 13.1 13.2 13.4 12.5  HCT 41.5 41.1 40.6 40.8  MCV 96.3 96.5 92.1 97.8  PLT 166 199 140* 470   Basic Metabolic Panel: Recent Labs  Lab 08/24/20 1224 08/24/20 1224 08/24/20 1754 08/25/20 0350 08/25/20 1708 08/26/20 0343 08/27/20 0724 08/29/20 0735 08/30/20 0356  NA  --   --   --  140  --  138 138 139 138  K  --   --   --  3.8  --  4.3 4.3 4.7 4.7  CL  --   --   --  107  --  107 105 104 101  CO2  --   --   --  26  --  26 25 25 29   GLUCOSE  --   --   --  140*  --  96 125* 107* 119*  BUN  --   --   --  23  --  26* 31* 34* 35*  CREATININE  --   --   --  0.85  --  0.82 0.90 0.88 0.86  CALCIUM  --   --   --  9.6  --  9.5 9.4 9.7 10.0  MG 2.1  --  2.0 2.1 2.1  --   --  2.3  --   PHOS 3.1   < > 2.9 2.1* 2.0*  --  2.9 2.8  --    < > = values in this interval not displayed.   GFR: Estimated Creatinine Clearance: 51.7 mL/min (by C-G formula based on SCr of 0.86 mg/dL). Liver Function Tests: Recent Labs  Lab 08/29/20 0735 08/30/20 0356  AST 45* 60*  ALT 24 29  ALKPHOS 144* 171*  BILITOT 1.0 1.0  PROT 5.8* 6.2*  ALBUMIN 2.3* 2.5*   CBG: Recent Labs  Lab 08/30/20 1542 08/30/20 1925 08/30/20 2314 08/31/20 0335 08/31/20 0730  GLUCAP 105* 112* 121* 104* 110*    Recent Results (from the past 240 hour(s))  MRSA PCR Screening      Status: None   Collection Time: 08/27/20 12:07 PM   Specimen: Nasopharyngeal  Result Value Ref Range Status   MRSA by PCR NEGATIVE NEGATIVE Final    Comment:        The GeneXpert MRSA Assay (FDA approved for NASAL specimens only), is one component of a comprehensive MRSA colonization surveillance program. It is not intended to diagnose MRSA infection nor to guide or monitor treatment for MRSA infections. Performed at [facility], [place] [address], [place], [place] [address]       Radiology Studies: No results found.   Scheduled Meds:  apixaban  2.5 mg Per Tube BID   chlorhexidine  15 mL Mouth Rinse BID   Chlorhexidine Gluconate Cloth  6 each Topical Daily   feeding supplement (PROSource TF)  45 mL Per Tube BID   furosemide  20 mg Per Tube BID   insulin aspart  0-9 Units Subcutaneous Q4H   lacosamide  100 mg Per Tube BID   levETIRAcetam  1,000 mg Per Tube BID   levothyroxine  75 mcg Per Tube Q0600   mouth rinse  15 mL Mouth Rinse q12n4p   metoprolol tartrate  75 mg Per Tube BID   nutrition supplement (JUVEN)  1 packet Per Tube BID BM   PHENObarbital  60 mg Per Tube QHS   Continuous Infusions:  diltiazem (CARDIZEM) infusion Stopped (08/27/20 0303)   feeding supplement (OSMOLITE 1.2 CAL) 1,000 mL (08/31/20 0321)     LOS: 12 days    Bonnell Public, MD 08/31/2020 11:08 AM [facility] If 7PM-7AM, please contact night-coverage 08/31/2020, 11:08 AM

## 2020-08-31 NOTE — Progress Notes (Signed)
Subjective: No acute events overnight.  Patient was able to wake up and answer few questions this morning but still continues to look very drowsy.  ROS: Unable to obtain due to poor mental status  Examination  Vital signs in last 24 hours: Temp:  [98 F (36.7 C)-99.2 F (37.3 C)] 98.4 F (36.9 C) (09/03 0728) Pulse Rate:  [88-142] 97 (09/03 0728) Resp:  [16-19] 16 (09/03 0728) BP: (102-134)/(67-79) 118/69 (09/03 0728) SpO2:  [90 %-98 %] 96 % (09/03 0728) Weight:  [88.6 kg] 88.6 kg (09/03 0325)  General: lying in bed,not in apparent distress CVS: pulse-normal rate and rhythm RS: breathing comfortably,CTAB Extremities: normal,edema Neuro:Drowsy, does not open eyes but able to tell her name and that she is in the hospital correctly while keeping her eyes closed, on opening her eyespupils equally round and reactive, midline with no gaze deviation, no apparent facial asymmetry, antigravity strength in all 4 extremities  Basic Metabolic Panel: Recent Labs  Lab 08/24/20 1224 08/24/20 1224 08/24/20 1754 08/25/20 0350 08/25/20 0350 08/25/20 1708 08/26/20 0343 08/26/20 0343 08/27/20 0724 08/29/20 0735 08/30/20 0356  NA  --   --   --  140  --   --  138  --  138 139 138  K  --   --   --  3.8  --   --  4.3  --  4.3 4.7 4.7  CL  --   --   --  107  --   --  107  --  105 104 101  CO2  --   --   --  26  --   --  26  --  25 25 29   GLUCOSE  --   --   --  140*  --   --  96  --  125* 107* 119*  BUN  --   --   --  23  --   --  26*  --  31* 34* 35*  CREATININE  --   --   --  0.85  --   --  0.82  --  0.90 0.88 0.86  CALCIUM  --   --   --  9.6   < >  --  9.5   < > 9.4 9.7 10.0  MG 2.1  --  2.0 2.1  --  2.1  --   --   --  2.3  --   PHOS 3.1   < > 2.9 2.1*  --  2.0*  --   --  2.9 2.8  --    < > = values in this interval not displayed.    CBC: Recent Labs  Lab 08/25/20 0350 08/26/20 0343 08/27/20 0724 08/29/20 0735  WBC 6.8 6.6 9.1 10.5  NEUTROABS 5.4 4.9 6.7 7.3  HGB 13.1 13.2  13.4 12.5  HCT 41.5 41.1 40.6 40.8  MCV 96.3 96.5 92.1 97.8  PLT 166 199 140* 204     Coagulation Studies: No results for input(s): LABPROT, INR in the last 72 hours.  Imaging No new brain imaging overnight  ASSESSMENT AND PLAN:86 year-old female with recent left temporal hemorrhage (07/22/2020) and associated seizures who presented withfocal motorstatus epilepticus, now resolved, but with continuedevidence of epileptogenicity and cortical dysfunction intheleft hemisphere.  Focalepilepsy with breakthrough seizures Recentleft temporal ICH (July 2021) Acute encephalopathy, due to seizures and medications(improving) Acute right cerebellar infarct and subacute left parietal infarct -No further seizures overnight.  Patient was slightly more awake and following commands yesterday  but appears very drowsy today.  Could be fluctuation mental status, delirium, iatrogenic.  Recommendations: -As patient continues to be drowsy, will reduce Vimpat to 50 mg twice daily. - Continuethe followingAEDs: 1.Keppra 1000 mg twice daily 2.Phenobarb 65 mg nightly -If patient has any breakthrough seizures, can increase Vimpat to 100 mg twice daily -Continue Eliquis 2.5 mg twice daily. - As needed IV Ativan 2 mg for clinical seizure-like activity. Please be cautious when administering Ativan to minimize excessive sedation -Seizure precautions, delirium precautions, minimize sedating medications - Management of rest of comorbidities per primary team -Follow-up with neurology in 8 to 12 weeks after discharge  I have spent a total of61minuteswith the patient reviewing hospitalnotes, test results, labs and examining the patient as well as establishing an assessment and plan that was discussed personally with the patient'sphysician Dr  Warden Fillers of time was spent in direct patient care.  Zeb Comfort Epilepsy [facility] For questions after 5pm please refer to AMION  to reach the Neurologist on call

## 2020-08-31 NOTE — Progress Notes (Signed)
Pt HR in 140s w/o prn control. EKG and 1s metop ordered per Zierle-Ghosh.

## 2020-08-31 NOTE — Plan of Care (Signed)

## 2020-08-31 NOTE — Progress Notes (Signed)
Occupational Therapy Treatment Patient Details Name: Lori Bridges MRN: [id number] DOB: May 06, 1930 Today's Date: 08/31/2020    History of present illness Lori Bridges is a 86 y.o. female with medical history significant of paroxysmal A. fib, chronic diastolic CHF, hypertension, hyperlipidemia, GERD, recent ICH, degenerative disc disease, CKD stage IIIa presents to emergency department with right-sided seizure-like activity. Pt just returned home on 8/19 from CIR s/p stroke affecting right side.   OT comments  This 86 yo female admitted with above presents to acute OT with eyes closed most of session, but following 75% of one step commands. Would open her eyes when asked.Said yes to wanting the TV turned on and when asked if she preferred NBC, CBS, or ABC and she said ABC. LEs are still very edematous and thus making it harder for her to move her legs (she can wiggle her toes both LEs). She will continue to benefit from acute OT with follow up at SNF.  Follow Up Recommendations  SNF;Supervision/Assistance - 24 hour    Equipment Recommendations  None recommended by OT       Precautions / Restrictions Precautions Precautions: Fall Precaution Comments: edematous t/o body (LEs more than UEs today) Restrictions Weight Bearing Restrictions: No       Mobility Bed Mobility Overal bed mobility: Needs Assistance Bed Mobility: Rolling Rolling: Total assist;+2 for physical assistance         General bed mobility comments: Rolling left and right to get cleaned up due to bowel accident            Vision   Additional Comments: Pt able to look up and down with eyes, but could not get her to look left or right with eyes. She did turn her head left and right with increased time and cues          Cognition Arousal/Alertness: Lethargic Behavior During Therapy: Flat affect Overall Cognitive Status: Difficult to assess                                 General Comments:  Pt would open eyes when she was asked to.She did follow 75% of one step commands (thumbs up, open/close hand, stick out tongue, close eyes tightly, raise right arm, raise left arm)        Exercises Other Exercises Other Exercises: Had pt work on reaching with left and right UEs           Pertinent Vitals/ Pain       Pain Assessment: No/denies pain         Frequency  Min 2X/week        Progress Toward Goals  OT Goals(current goals can now be found in the care plan section)  Progress towards OT goals: Progressing toward goals (slowly)  Acute Rehab OT Goals Patient Stated Goal: family would like her home OT Goal Formulation: With family Time For Goal Achievement: 09/10/20 Potential to Achieve Goals: [place] Discharge plan remains appropriate       AM-PAC OT "6 Clicks" Daily Activity     Outcome Measure   Help from another person eating meals?: Total Help from another person taking care of personal grooming?: Total Help from another person toileting, which includes using toliet, bedpan, or urinal?: Total Help from another person bathing (including washing, rinsing, drying)?: Total Help from another person to put on and taking off regular upper body clothing?: Total Help from another  person to put on and taking off regular lower body clothing?: Total 6 Click Score: 6    End of Session    OT Visit Diagnosis: Other abnormalities of gait and mobility (R26.89);Muscle weakness (generalized) (M62.81);Low vision, both eyes (H54.2);Other symptoms and signs involving cognitive function;Hemiplegia and hemiparesis Symptoms and signs involving cognitive functions:  (seizures) Hemiplegia - Right/Left:  (Bil)   Activity Tolerance Patient limited by lethargy   Patient Left in bed;with call bell/phone within reach;with bed alarm set   Nurse Communication          Time: 2811-8867 OT Time Calculation (min): 22 min  Charges: OT General Charges $OT Visit: 1 Visit OT  Treatments $Therapeutic Activity: 8-22 mins  [place], OTR/L Acute [employer] Pager [phone] Office [phone]      Almon Register 08/31/2020, 8:39 PM

## 2020-09-01 LAB — GLUCOSE, CAPILLARY
Glucose-Capillary: 127 mg/dL — ABNORMAL HIGH (ref 70–99)
Glucose-Capillary: 112 mg/dL — ABNORMAL HIGH (ref 70–99)
Glucose-Capillary: 115 mg/dL — ABNORMAL HIGH (ref 70–99)
Glucose-Capillary: 126 mg/dL — ABNORMAL HIGH (ref 70–99)
Glucose-Capillary: 116 mg/dL — ABNORMAL HIGH (ref 70–99)
Glucose-Capillary: 115 mg/dL — ABNORMAL HIGH (ref 70–99)
Glucose-Capillary: 139 mg/dL — ABNORMAL HIGH (ref 70–99)

## 2020-09-01 LAB — RENAL FUNCTION PANEL
Albumin: 3.1 g/dL — ABNORMAL LOW (ref 3.5–5.0)
Anion gap: 9 (ref 5–15)
BUN: 51 mg/dL — ABNORMAL HIGH (ref 8–23)
CO2: 28 mmol/L (ref 22–32)
Calcium: 9.8 mg/dL (ref 8.9–10.3)
Chloride: 98 mmol/L (ref 98–111)
Creatinine, Ser: 0.97 mg/dL (ref 0.44–1.00)
GFR calc Af Amer: >60 mL/min (ref >60)
GFR calc non Af Amer: 52 mL/min — ABNORMAL LOW (ref >60)
Glucose, Bld: 122 mg/dL — ABNORMAL HIGH (ref 70–99)
Phosphorus: 2.7 mg/dL (ref 2.5–4.6)
Potassium: 4.2 mmol/L (ref 3.5–5.1)
Sodium: 135 mmol/L (ref 135–145)

## 2020-09-01 MED ORDER — METOPROLOL TARTRATE 5 MG/5ML IV SOLN
5.0000 mg | Freq: Once | INTRAVENOUS | Status: AC
  Administered 2020-09-01: 5 mg via INTRAVENOUS
  Filled 2020-09-01: qty 5

## 2020-09-01 MED ORDER — PANTOPRAZOLE SODIUM 40 MG PO PACK: 40.0000 mg | PACK | Freq: Every day | ORAL | Status: DC | PRN

## 2020-09-01 NOTE — Progress Notes (Signed)
PROGRESS NOTE    Lori Bridges  RAQ:[id number] DOB: [birth year] DOA: 08/18/2020 PCP: Derinda Late, MD    Brief Narrative:   The patient is an 86 year old elderly chronically ill-appearing female with a past medical history significant for uncontrolled hypertension who had a recent cerebral hemorrhage requiring hospitalization and subsequent rehabilitation and then was discharged home using a utility walker but was unable to get around. She was brought back to the hospital due to decreased level of consciousness as well as shaking her right arm. Neurology was consulted and the neurologist started antiepileptics. There was concern for airway compromise so she was transferred to the ICU for closer monitoring and there were discussions regarding possible intubation considering multiple health issues. Patient was admitted with acute encephalopathy secondary to seizure activity and remained deeply sedated due to recurrent seizures. She was unable to follow commands. Neurology is following and recommending antiepileptics. She had a recent MRI which showed an acute CVA.  09/01/2020: Patient seen.  Patient is able to follow some commands, but slowly.  Patient remains significantly lethargic.  Prognosis is guarded.    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 Seizure History of intracerebral hemorrhage -Patient remains deeply sedated due to recurrent seizures, she is unable to follow any commands and she is mildly tachypnic -Neurology following, appreciate assistance -C/wKeppra, [place], [place] [facility] managing;Perampanel now discontiuedper neuro. -If clinical concern for sz, neuro may initiate PHT -PRN Ativan for generalized tonic clonic sz > 2 minutes or focal sz > 23minutes Seizure precautions -At risk for poor airway protection in this setting -- family would want ETT if indicated. Noted that family would not want trach -Currently has a Panda tube is getting tube feedings through that -Check labs in AM  08/31/2020: Seen alongside patient's daughter.  Patient is able to follow commands today.  Patient opens eyes on command.  Patient is also able to squeeze my hands on commands.  Neurology input is appreciated.  Antiseizure medication has been adjusted accordingly. 04/17/2020: Patient seen.  No new changes.  Guarded prognosis.  Acute CVA -MRI shows "Acute subcentimeter right cerebellar infarct. 2. Decreased size of left parietal and left temporal lobe hemorrhages with mild residual edema at the former. Mild surrounding cortical signal abnormality in the left parietal lobe may reflect an associated subacute infarct and possibly recent seizure activity." -Neurology following and will discuss with Dr. Felecia Shelling 09/01/2020: Patient is weak on the right side.  Patient does not move the right lower extremity.  Atrial fibrillation withRVR in a patient with Permanent A Fib -Home medications include lopressor -Previously on coumadin but this has remained on hold since Development of intraparenchymal hemorrhage -Continue oral beta blockerp.o. twice daily per tube -Continuous telemetry; remains in A Fib  -Currently on a diltiazem drip and will continue for now -if not improving will consult cardiology for further  evaluation 09/01/2020: Seems  stable for now.  Continue to manage expectantly.  Hypothyroidism -Continue Synthroid75 mcg per tube daily -TSH was 4.884  Hyperglycemiain the setting of Pre-Diabete -C/wSSIand is currently on a sensitive NovoLog sign scale every 4 hours -CBG's ranging from 100-145 -HbA1c was 5.7 in July  HTN -C/wTele monitoring -Lasix20 mg per Tube BID -Continue with metoprolol 75 mg per tube twice daily -Continue with labetalol 10 mg IV every 6 hours as needed for blood pressure of systolic greater than 563 or diastolic reading 875  Thrombocytopenia -Patient's platelet count was 140 -Continue to monitor for signs and symptoms of bleeding -Repeat CBC in a.m. up to 204  Volume overloadand Acute on Chronic Diastolic CHF -Net -643PI 9/51 -C/w poLasix -Strict I's and O's and Daily Weights  GERD/Reflux -C/w PPI 40 mg po Pantoprazole Daily PRN   DVT prophylaxis: SCD/Compression stockings Code Status: Full code  Family Communication:  Disposition Plan: pending clinical improvement   Remains inpatient appropriate because:Hemodynamically unstable and IV treatments appropriate due to intensity of illness or inability to take PO  Consultants:   Neurology  PCCM  Palliative Care  Procedures:  EEG  LTM  MRI  Antimicrobials: Anti-infectives (From admission, onward)   None      Subjective: No new changes. Follows command, though, sluggishly.   No history from patient.  Objective: Vitals:   09/01/20 0320 09/01/20 0717 09/01/20 1223 09/01/20 1644  BP:  107/73 107/63 126/83  Pulse: (!) 106 (!) 107 80 (!) 105  Resp: 19 20 17 20   Temp:  100.1 F (37.8 C) 99.6 F (37.6 C) 99.6 F (37.6 C)  TempSrc:  Oral Oral Axillary  SpO2: 97% 95% 97% 95%  Weight:      Height:        Intake/Output Summary (Last 24 hours) at 09/01/2020 1921 Last data filed at 09/01/2020 1225 Gross per 24 hour  Intake --  Output 1275 ml  Net -1275 ml   Filed  Weights   08/26/20 0500 08/27/20 0414 08/31/20 0325  Weight: 87.4 kg 84.4 kg 88.6 kg    Examination:  General exam: Not in distress.  Significant edema of the legs.  Response to voice commands.  Ability hands. Respiratory system: Clear to auscultation.  Cardiovascular system: S1 & S2 heard Gastrointestinal system: Abdomen is obese, soft and nontender.  Organs are difficult assess. Central nervous system: Patient seems to be more responsive.  Patient is following simple voice commands.  Patient is able to squeeze my fingers.  Patient is weak on the right side, particularly, the right lower extremity (patient barely moves right lower extremity). Extremities: 2+ to 3 bilateral leg edema.  Data Reviewed: I have personally reviewed following labs and imaging studies  CBC: Recent Labs  Lab 08/26/20 0343 08/27/20 0724 08/29/20 0735  WBC 6.6 9.1 10.5  NEUTROABS 4.9 6.7 7.3  HGB 13.2 13.4 12.5  HCT 41.1 40.6 40.8  MCV 96.5 92.1 97.8  PLT 199 140* 884   Basic Metabolic Panel: Recent Labs  Lab 08/26/20 0343 08/27/20 0724 08/29/20 0735 08/30/20 0356 09/01/20 0915  NA 138 138 139 138 135  K 4.3 4.3 4.7 4.7 4.2  CL 107 105 104 101 98  CO2 26 25 25 29 28   GLUCOSE 96 125* 107* 119* 122*  BUN 26* 31* 34* 35* 51*  CREATININE 0.82 0.90 0.88 0.86 0.97  CALCIUM 9.5 9.4 9.7 10.0 9.8  MG  --   --  2.3  --   --   PHOS  --  2.9 2.8  --  2.7   GFR: Estimated Creatinine Clearance: 45.8 mL/min (by C-G formula based on SCr of 0.97 mg/dL). Liver Function Tests: Recent Labs  Lab 08/29/20 0735 08/30/20 0356 09/01/20 0915  AST 45* 60*  --   ALT 24 29  --   ALKPHOS 144* 171*  --   BILITOT 1.0 1.0  --   PROT 5.8* 6.2*  --   ALBUMIN 2.3* 2.5* 3.1*   CBG: Recent Labs  Lab 09/01/20 0106 09/01/20 0336 09/01/20 0715 09/01/20 1224 09/01/20 1639  GLUCAP 127* 112* 115* 126* 116*    Recent Results (from the past 240 hour(s))  MRSA PCR Screening     Status: None   Collection Time:  08/27/20 12:07 PM   Specimen: Nasopharyngeal  Result Value Ref Range Status   MRSA by PCR NEGATIVE NEGATIVE Final    Comment:        The GeneXpert MRSA Assay (FDA approved for NASAL specimens only), is one component of a comprehensive MRSA colonization surveillance program. It is not intended to diagnose MRSA infection nor to guide or monitor treatment for MRSA infections. Performed at [facility], [place] [address] [place], [place] [address]       Radiology Studies: No results found.   Scheduled Meds: . apixaban  2.5 mg Per Tube BID  . chlorhexidine  15 mL Mouth Rinse BID  . Chlorhexidine Gluconate Cloth  6 each Topical Daily  . feeding supplement (PROSource TF)  45 mL Per Tube BID  . furosemide  40 mg Intravenous Q12H  . insulin aspart  0-9 Units Subcutaneous Q4H  . lacosamide  50 mg Per Tube BID  . levETIRAcetam  1,000 mg Per Tube BID  . levothyroxine  75 mcg Per Tube Q0600  . mouth rinse  15 mL Mouth Rinse q12n4p  . metoprolol tartrate  75 mg Per Tube BID  . nutrition supplement (JUVEN)  1 packet Per Tube BID BM  . PHENObarbital  60 mg Per Tube QHS   Continuous Infusions: . albumin human 25 g (09/01/20 1323)  . diltiazem (CARDIZEM) infusion Stopped (08/27/20 0303)  . feeding supplement (OSMOLITE 1.2 CAL) 1,000 mL (09/01/20 1857)     LOS: 13 days    Bonnell Public, MD 09/01/2020 7:21 PM [facility] If 7PM-7AM, please contact night-coverage 09/01/2020, 7:21 PM

## 2020-09-02 LAB — CBC
HCT: 39 % (ref 36.0–46.0)
Hemoglobin: 12.5 g/dL (ref 12.0–15.0)
MCH: 30.7 pg (ref 26.0–34.0)
MCHC: 32.1 g/dL (ref 30.0–36.0)
MCV: 95.8 fL (ref 80.0–100.0)
Platelets: 221 10*3/uL (ref 150–400)
RBC: 4.07 MIL/uL (ref 3.87–5.11)
RDW: 15.7 % — ABNORMAL HIGH (ref 11.5–15.5)
WBC: 7.6 10*3/uL (ref 4.0–10.5)
nRBC: 0 % (ref 0.0–0.2)

## 2020-09-02 LAB — GLUCOSE, CAPILLARY
Glucose-Capillary: 110 mg/dL — ABNORMAL HIGH (ref 70–99)
Glucose-Capillary: 124 mg/dL — ABNORMAL HIGH (ref 70–99)
Glucose-Capillary: 118 mg/dL — ABNORMAL HIGH (ref 70–99)
Glucose-Capillary: 133 mg/dL — ABNORMAL HIGH (ref 70–99)
Glucose-Capillary: 115 mg/dL — ABNORMAL HIGH (ref 70–99)

## 2020-09-02 NOTE — Progress Notes (Addendum)
PROGRESS NOTE    Lori Bridges  AVW:[id number] DOB: 20-Oct-1930 DOA: 08/18/2020 PCP: Derinda Late, MD    Brief Narrative:   The patient is an 86 year old elderly chronically ill-appearing female with a past medical history significant for uncontrolled hypertension who had a recent cerebral hemorrhage requiring hospitalization and subsequent rehabilitation and then was discharged home using a utility walker but was unable to get around. She was brought back to the hospital due to decreased level of consciousness as well as shaking her right arm. Neurology was consulted and the neurologist started antiepileptics. There was concern for airway compromise so she was transferred to the ICU for closer monitoring and there were discussions regarding possible intubation considering multiple health issues. Patient was admitted with acute encephalopathy secondary to seizure activity and remained deeply sedated due to recurrent seizures. She was unable to follow commands. Neurology is following and recommending antiepileptics. She had a recent MRI which showed an acute CVA.  09/01/2020: Patient seen.  Patient is able to follow some commands, but slowly.  Patient remains significantly lethargic.  Prognosis is guarded.   09/02/2020: Patient seen.  No new changes.  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 Seizure History of intracerebral hemorrhage -Patient remains deeply sedated due to recurrent seizures, she is unable to follow any commands and she is mildly tachypnic -Neurology following, appreciate assistance -C/wKeppra, [place], [place] [facility] managing;Perampanel now discontiuedper neuro. -If clinical concern for sz, neuro may initiate PHT -PRN Ativan for generalized tonic clonic sz > 2 minutes or focal sz > 43minutes Seizure precautions -At risk for poor airway protection in this setting -- family would want ETT if indicated. Noted that family would not want trach -Currently has a Panda tube is getting tube feedings through that -Check labs in AM  08/31/2020: Seen alongside patient's daughter.  Patient is able to follow commands today.  Patient opens eyes on command.  Patient is also able to squeeze my hands on commands.  Neurology input is appreciated.  Antiseizure medication has been adjusted accordingly. 09/02/2020: Patient seen.  No new changes.  Guarded prognosis.  Acute CVA -MRI shows "Acute subcentimeter right cerebellar infarct. 2. Decreased size of left parietal and left temporal lobe hemorrhages with mild residual edema at the former. Mild surrounding cortical signal abnormality in the left parietal lobe may reflect an associated subacute infarct and possibly recent seizure activity." -Neurology following and will discuss with Dr. Felecia Shelling 09/02/2020: Patient is weak on the right side.  Patient does not move the right lower extremity.  Atrial fibrillation withRVR in a patient with Permanent A Fib -Home medications include lopressor -Previously on coumadin but this has remained on hold since Development of intraparenchymal hemorrhage -Continue oral beta blockerp.o. twice daily per tube -Continuous telemetry; remains in A Fib  -Currently on a diltiazem drip and will continue for now -if not improving will consult cardiology  for further evaluation 09/02/2020: Continue to manage expectantly.  Hypothyroidism -Continue Synthroid75 mcg per tube daily -TSH was 4.884  Hyperglycemiain the setting of Pre-Diabete -C/wSSIand is currently on a sensitive NovoLog sign scale every 4 hours -CBG's ranging from 100-145 -HbA1c was 5.7 in July  HTN -C/wTele monitoring -Lasix20 mg per Tube BID -Continue with metoprolol 75 mg per tube twice daily -Continue with labetalol 10 mg IV every 6 hours as needed for blood pressure of systolic greater than 831 or diastolic reading 517  Thrombocytopenia -Patient's platelet count was 140 -Continue to monitor for signs and symptoms of bleeding 09/02/2020: Continue to monitor closely.  Peripheral edema/Chronic Diastolic CHF 05/29/6072: Discontinue Lasix.  Completed IV albumin.  Rising BUN.   -Edema is likely multifactorial, low albumin, with severe TR and likely right-sided heart failure.  GERD/Reflux -C/w PPI 40 mg po Pantoprazole Daily PRN   DVT prophylaxis: SCD/Compression stockings Code Status: Full code  Family Communication:  Disposition Plan: pending clinical improvement   Remains inpatient appropriate because:Hemodynamically unstable and IV treatments appropriate due to intensity of illness or inability to take PO  Consultants:   Neurology  PCCM  Palliative Care  Procedures:  EEG  LTM  MRI  Antimicrobials: Anti-infectives (From admission, onward)   None      Subjective: No new changes.   No history from patient.  Objective: Vitals:   09/02/20 0359 09/02/20 0500 09/02/20 0753 09/02/20 1556  BP: 140/85  135/69 (!) 138/91  Pulse: (!) 115   (!) 146  Resp: (!) 23  20 20   Temp: 99.4 F (37.4 C)  98.9 F (37.2 C) 99.2 F (37.3 C)  TempSrc: Axillary  Axillary Axillary  SpO2: 91%  94% 92%  Weight:  92 kg    Height:        Intake/Output Summary (Last 24 hours) at 09/02/2020 1807 Last data filed at 09/02/2020 1600 Gross per 24 hour   Intake 431.92 ml  Output 500 ml  Net -68.08 ml   Filed Weights   08/27/20 0414 08/31/20 0325 09/02/20 0500  Weight: 84.4 kg 88.6 kg 92 kg    Examination:  General exam: Not in distress.  Significant edema of the legs.  Response to voice commands.  Ability hands. Respiratory system: Clear to auscultation.  Cardiovascular system: S1 & S2 heard Gastrointestinal system: Abdomen is obese, soft and nontender.  Organs are difficult assess. Central nervous system: Patient seems to be more responsive.  Patient is following simple voice commands.  Patient is able to squeeze my fingers.  Patient is weak on the right side, particularly, the right lower extremity (patient barely moves right lower extremity). Extremities: 2+ to 3 bilateral leg edema.  Data Reviewed: I have personally reviewed following labs and imaging studies  CBC: Recent Labs  Lab 08/27/20 0724 08/29/20 0735 09/02/20 0422  WBC 9.1 10.5 7.6  NEUTROABS 6.7 7.3  --   HGB 13.4 12.5 12.5  HCT 40.6 40.8 39.0  MCV 92.1 97.8 95.8  PLT 140* 204 710   Basic Metabolic Panel: Recent Labs  Lab 08/27/20 0724 08/29/20 0735 08/30/20 0356 09/01/20 0915  NA 138 139 138 135  K 4.3 4.7 4.7 4.2  CL 105 104 101 98  CO2 25 25 29 28   GLUCOSE 125* 107* 119* 122*  BUN 31* 34* 35* 51*  CREATININE 0.90 0.88 0.86 0.97  CALCIUM 9.4 9.7 10.0 9.8  MG  --  2.3  --   --   PHOS 2.9 2.8  --  2.7  GFR: Estimated Creatinine Clearance: 46.7 mL/min (by C-G formula based on SCr of 0.97 mg/dL). Liver Function Tests: Recent Labs  Lab 08/29/20 0735 08/30/20 0356 09/01/20 0915  AST 45* 60*  --   ALT 24 29  --   ALKPHOS 144* 171*  --   BILITOT 1.0 1.0  --   PROT 5.8* 6.2*  --   ALBUMIN 2.3* 2.5* 3.1*   CBG: Recent Labs  Lab 09/01/20 2328 09/02/20 0415 09/02/20 0752 09/02/20 1222 09/02/20 1558  GLUCAP 139* 110* 124* 118* 133*    Recent Results (from the past 240 hour(s))  MRSA PCR Screening     Status: None   Collection Time:  08/27/20 12:07 PM   Specimen: Nasopharyngeal  Result Value Ref Range Status   MRSA by PCR NEGATIVE NEGATIVE Final    Comment:        The GeneXpert MRSA Assay (FDA approved for NASAL specimens only), is one component of a comprehensive MRSA colonization surveillance program. It is not intended to diagnose MRSA infection nor to guide or monitor treatment for MRSA infections. Performed at [facility], [place] [address] [place], [place] [address]       Radiology Studies: No results found.   Scheduled Meds: . apixaban  2.5 mg Per Tube BID  . chlorhexidine  15 mL Mouth Rinse BID  . Chlorhexidine Gluconate Cloth  6 each Topical Daily  . feeding supplement (PROSource TF)  45 mL Per Tube BID  . insulin aspart  0-9 Units Subcutaneous Q4H  . lacosamide  50 mg Per Tube BID  . levETIRAcetam  1,000 mg Per Tube BID  . levothyroxine  75 mcg Per Tube Q0600  . mouth rinse  15 mL Mouth Rinse q12n4p  . metoprolol tartrate  75 mg Per Tube BID  . nutrition supplement (JUVEN)  1 packet Per Tube BID BM  . PHENObarbital  60 mg Per Tube QHS   Continuous Infusions: . diltiazem (CARDIZEM) infusion Stopped (08/27/20 0303)  . feeding supplement (OSMOLITE 1.2 CAL) 1,000 mL (09/02/20 1555)     LOS: [place] days    Bonnell Public, MD 09/02/2020 6:07 PM [facility] If 7PM-7AM, please contact night-coverage 09/02/2020, 6:07 PM

## 2020-09-02 NOTE — Care Management (Signed)
Spoke w patient's daughter on the phone.  She confirmed that her mother DOES NOT have prescription drug coverage, despite having Medicare A/B and a supplemental BCBS policy.  Consult received for Eliquis. Explained to patient's daughter that she would be able to use one 30 day Free Eliquis coupon, but after that refills would be around $450 a month.  We discussed her signing up for a drug plan during October open enrollment. TOC will continue to follow for medication assistance. Patient IS NOT eligible for [facility] program.

## 2020-09-02 NOTE — Progress Notes (Signed)
Daily Progress Note   Patient Name: Lori Bridges       Date: 09/02/2020 DOB: May 16, 1930  Age: 86 y.o. MRN#: [id number] Attending Physician: Bonnell Public, MD Primary Care Physician: Derinda Late, MD Admit Date: 08/18/2020  Reason for Consultation/Follow-up: Establishing goals of care  Subjective: Opens eyes to voice, otherwise lethargic. Not following commands and no verbal communication. No s/s of pain or discomfort. No family at bedside.   Discussed with RN and reviewed chart in detail.   GOC:  F/u call placed to patient's daughter/documented HCPOA Mauri Brooklyn). Discussed events leading up to admission and course of hospitalization including diagnoses, interventions, plan of care. Reviewed neurology recommendations. Discussed concern with ongoing altered mental status this weekend.   [name] did speak with her sisters about code status and they are leaning against CPR if their mother died naturally. They don't want 'beating on her chest.' Compassionately agreed with this decision, sharing that this would cause suffering at EOL. [name] cannot speak to whether or not she would desire ventilator support if necessary. [name] shares that her mother would never discuss her wishes with children. Attempted to complete limited code status with [name] but she becomes overwhelmed with these decisions, as she just buried her husband last week. Emotional support provided.   [name] plans to visit [facility] tomorrow, Monday 9/6 and would prefer to further discuss code status and goals in person with provider. Reassured [name] that either [name] or myself would follow up with her tomorrow. She does not designate a time but has PMT contact information and told to call when she arrives at the hospital.     [name] speaks of taking this 'one day at a time' and the family remains hopeful that their mother will wake up. [name] shares that she was 'perfectly fine' cognitively in the ER prior to seizure medication initiation. She remains hopeful that her mother will be able to progress with therapy.   Reassured of ongoing support this admission. Also that I will reach out to Dr. Hortense Ramal in the morning to hear her expert opinion on neurology recommendations.    Length of Stay: 14  Current Medications: Scheduled Meds:  . apixaban  2.5 mg Per Tube BID  . chlorhexidine  15 mL Mouth Rinse BID  . Chlorhexidine Gluconate Cloth  6 each Topical  Daily  . feeding supplement (PROSource TF)  45 mL Per Tube BID  . furosemide  40 mg Intravenous Q12H  . insulin aspart  0-9 Units Subcutaneous Q4H  . lacosamide  50 mg Per Tube BID  . levETIRAcetam  1,000 mg Per Tube BID  . levothyroxine  75 mcg Per Tube Q0600  . mouth rinse  15 mL Mouth Rinse q12n4p  . metoprolol tartrate  75 mg Per Tube BID  . nutrition supplement (JUVEN)  1 packet Per Tube BID BM  . PHENObarbital  60 mg Per Tube QHS    Continuous Infusions: . diltiazem (CARDIZEM) infusion Stopped (08/27/20 0303)  . feeding supplement (OSMOLITE 1.2 CAL) 1,000 mL (09/01/20 1857)    PRN Meds: acetaminophen **OR** acetaminophen, labetalol, LORazepam, ondansetron **OR** ondansetron (ZOFRAN) IV, pantoprazole sodium  Physical Exam Vitals and nursing note reviewed.  HENT:     Head: Normocephalic and atraumatic.  Cardiovascular:     Rate and Rhythm: Rhythm irregularly irregular.  Pulmonary:     Effort: No tachypnea, accessory muscle usage or respiratory distress.  Skin:    General: Skin is warm and dry.     Comments: Anasarca  Neurological:     Mental Status: She is lethargic.     Comments: Opens eyes to voice but falls back to sleep. Does not follow commands. No verbal communication.   Psychiatric:        Attention and Perception: She is  inattentive.        Speech: She is noncommunicative.        Cognition and Memory: Cognition is impaired.            Vital Signs: BP 135/69 (BP Location: Right Arm)   Pulse (!) 115   Temp 98.9 F (37.2 C) (Axillary)   Resp 20   Ht 5\' 7"  (1.702 m)   Wt 92 kg   SpO2 94%   BMI 31.77 kg/m  SpO2: SpO2: 94 % O2 Device: O2 Device: Nasal Cannula O2 Flow Rate: O2 Flow Rate (L/min): 1 L/min  Intake/output summary: No intake or output data in the 24 hours ending 09/02/20 1507 LBM: Last BM Date: 09/02/20 Baseline Weight: Weight: 81.6 kg Most recent weight: Weight: 92 kg       Palliative Assessment/Data: PPS 30%    Flowsheet Rows     Most Recent Value  Intake Tab  Referral Department Hospitalist  Unit at Time of Referral ICU  Palliative Care Primary Diagnosis Neurology  Date Notified 08/24/20  Palliative Care Type New Palliative care  Reason for referral Clarify Goals of Care  Date of Admission 08/18/20  Date first seen by Palliative Care 08/25/20  # of days Palliative referral response time 1 Day(s)  # of days IP prior to Palliative referral 6  Clinical Assessment  Psychosocial & Spiritual Assessment  Palliative Care Outcomes      Patient Active Problem List   Diagnosis Date Noted  . Palliative care by specialist   . Altered mental status   . Pressure injury of skin 08/26/2020  . 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[place])   . Seizure disorder ([place]) 08/19/2020  . Fall   . Peripheral edema   . Sleep disturbance   . Hyperkalemia   . Hyponatremia   . Leukopenia   . Atrial fibrillation ([place])   . Thrombocytopenia ([place])   . Hypernatremia   . Benign essential HTN   . Dysphagia, post-stroke   . PAF (paroxysmal atrial fibrillation) ([place])   .  DNR (do not resuscitate) discussion   . Gastroesophageal reflux disease   . Seizure ([place]) 07/26/2020  . Dysphagia due to recent stroke 07/26/2020  . UTI (urinary tract infection) 07/26/2020  . CKD (chronic kidney  disease), stage IIIa 07/26/2020  . Hypothyroidism 07/26/2020  . ICH (intracerebral hemorrhage) ([facility]) - 2 small L brain ICH, likely d/t HTN on subtherapeutic warfarin 07/22/2020  . Chronic diastolic CHF (congestive heart failure) ([place]) 04/03/2017  . Pulmonary HTN ([place]) 04/03/2017  . Chest pain 04/03/2017  . Dizziness 06/30/2016  . Encounter for therapeutic drug monitoring 01/24/2014  . Hyperlipidemia 08/24/2013  . Long term (current) use of anticoagulants 05/17/2013  . Tricuspid regurgitation 03/04/2012  . Nausea 02/16/2012  . Personal history of colonic polyps 10/09/2011  . HYPERTENSION, MILD 09/11/2010  . Permanent atrial fibrillation ([place]) 07/23/2010    Palliative Care Assessment & Plan   Patient Profile: The patient is an 86 year old elderly chronically ill-appearing female with a past medical history significant for uncontrolled hypertension who had a recent cerebral hemorrhage requiring hospitalization and subsequent rehabilitation and then was discharged home using a utility walker but was unable to get around. She was brought back to the hospital due to decreased level of consciousness as well as shaking her right arm. Neurology was consulted and the neurologist started antiepileptics. There was concern for airway compromise so she was transferred to the ICU for closer monitoring and there were discussions regarding possible intubation considering multiple health issues. Patient was admitted with acute encephalopathy secondary to seizure activity and remained deeply sedated due to recurrent seizures. She was unable to follow commands. Neurology is following and recommending antiepileptics. She had a recent MRI which showed an acute CVA.  Assessment: Acute encephalopathy secondary to ? anti-epileptics Seizure activity Recent intracerebral hemorrhage Acute CVA Afib RVR Acute on chronic diastolic CHF Thrombocytopenia HTN  Recommendations/Plan:  Continue current plan of care and  medical management.  Daughter, Mauri Brooklyn is documented HCPOA. She has discussed code status with family and they are leaning against CPR in the event of cardiac arrest. Attempted to complete limited code status today but [name] becomes overwhelmed with decisions over the phone, as her husband just passed last week. [name] plans to visit [facility] tomorrow 9/6 and would prefer to further discuss in person.   F/u with neurology in AM regarding recommendations and prognosis.  Ongoing PMT support. Daughter did not designate a time for PMT meeting tomorrow but has PMT contact information and will call when she arrives to the hospital.   Code Status: FULL    Code Status Orders  (From admission, onward)         Start     Ordered   08/19/20 1318  Full code  Continuous        08/19/20 1319        Code Status History    Date Active Date Inactive Code Status Order ID Comments User Context   07/26/2020 1705 08/16/2020 1421 Full Code [id number]  Elizabeth Sauer Inpatient   07/26/2020 1705 07/26/2020 1705 Full Code [id number]  Elizabeth Sauer Inpatient   07/22/2020 1915 07/26/2020 1625 Full Code [id number]  Donnetta Simpers, MD ED   Advance Care Planning Activity       Prognosis:   Unable to determine  Discharge Planning:  To Be Determined  Care plan was discussed with RN, daughter [name])  Thank you for allowing the Palliative Medicine Team to assist in the care of this patient.   Total Time  30 Prolonged Time Billed  no      Greater than 50%  of this time was spent counseling and coordinating care related to the above assessment and plan.  Ihor Dow, DNP, FNP-C Palliative Medicine Team  Phone: [phone] Fax: [phone]  Please contact Palliative Medicine Team phone at [phone] for questions and concerns.

## 2020-09-03 DIAGNOSIS — I4821 Permanent atrial fibrillation: Secondary | ICD-10-CM

## 2020-09-03 DIAGNOSIS — Z66 Do not resuscitate: Secondary | ICD-10-CM

## 2020-09-03 LAB — GLUCOSE, CAPILLARY
Glucose-Capillary: 109 mg/dL — ABNORMAL HIGH (ref 70–99)
Glucose-Capillary: 110 mg/dL — ABNORMAL HIGH (ref 70–99)
Glucose-Capillary: 122 mg/dL — ABNORMAL HIGH (ref 70–99)
Glucose-Capillary: 124 mg/dL — ABNORMAL HIGH (ref 70–99)
Glucose-Capillary: 131 mg/dL — ABNORMAL HIGH (ref 70–99)
Glucose-Capillary: 165 mg/dL — ABNORMAL HIGH (ref 70–99)

## 2020-09-03 LAB — BASIC METABOLIC PANEL
Anion gap: 11 (ref 5–15)
BUN: 57 mg/dL — ABNORMAL HIGH (ref 8–23)
CO2: 28 mmol/L (ref 22–32)
Calcium: 10.7 mg/dL — ABNORMAL HIGH (ref 8.9–10.3)
Chloride: 98 mmol/L (ref 98–111)
Creatinine, Ser: 0.99 mg/dL (ref 0.44–1.00)
GFR calc Af Amer: 59 mL/min — ABNORMAL LOW (ref >60)
GFR calc non Af Amer: 51 mL/min — ABNORMAL LOW (ref >60)
Glucose, Bld: 127 mg/dL — ABNORMAL HIGH (ref 70–99)
Potassium: 3.8 mmol/L (ref 3.5–5.1)
Sodium: 137 mmol/L (ref 135–145)

## 2020-09-03 LAB — LACTIC ACID, PLASMA: Lactic Acid, Venous: 1 mmol/L (ref 0.5–1.9)

## 2020-09-03 LAB — PROCALCITONIN: Procalcitonin: 0.24 ng/mL

## 2020-09-03 MED ORDER — AMANTADINE HCL 100 MG PO CAPS
100.0000 mg | ORAL_CAPSULE | Freq: Two times a day (BID) | ORAL | Status: DC
  Administered 2020-09-03 – 2020-09-05 (×6): 100 mg
  Filled 2020-09-03 (×7): qty 1

## 2020-09-03 NOTE — Progress Notes (Signed)
Patient mews is red again and her temp is 101.5, text Dr Maryland Pink and he ordered stat labs.

## 2020-09-03 NOTE — Progress Notes (Signed)
Physical Therapy Treatment Patient Details Name: Lori Bridges MRN: [id number] DOB: March 08, 1930 Today's Date: 09/03/2020    History of Present Illness Lori Bridges is a 86 y.o. female with medical history significant of paroxysmal A. fib, chronic diastolic CHF, hypertension, hyperlipidemia, GERD, recent ICH, degenerative disc disease, CKD stage IIIa presents to emergency department with right-sided seizure-like activity. Pt just returned home on 8/19 from CIR s/p stroke affecting right side.    PT Comments    Session was limited due to pt sedated from ?seizure med, though she has been given ?something in an attempt to arouse her awhile before my arrival.  Emphasis on ROM/heel cord stretches, U and LE's, sitting up using bed in lieu of sitting EOB due to low responsiveness.  Will continue to monitor.    Follow Up Recommendations  SNF     Equipment Recommendations  None recommended by PT;Other (comment) (TBA)    Recommendations for Other Services       Precautions / Restrictions Precautions Precautions: Fall Precaution Comments: edematous limbs.    Mobility  Bed Mobility Overal bed mobility: Needs Assistance             General bed mobility comments: use sitting upright feature of the bed to get pt to 70*.  BP 130's /70's  Pt did not become more alert in sitting.  Very low tone overall, so used bed in lieu of sitting up/dangling at EOB  Transfers Overall transfer level: Needs assistance               General transfer comment: not appropriate at this time  Ambulation/Gait                 Stairs             Wheelchair Mobility    Modified Rankin (Stroke Patients Only) Modified Rankin (Stroke Patients Only) Pre-Morbid Rankin Score: Moderately severe disability Modified Rankin: Severe disability     Balance                                            Cognition Arousal/Alertness: Lethargic Behavior During Therapy: Flat  affect Overall Cognitive Status: Difficult to assess                                        Exercises Other Exercises Other Exercises: ROM to all 4's in prep for sitting EOB, but pt did not awaken with movement and sitting EOB aborted.    General Comments General comments (skin integrity, edema, etc.): vss overall, but mobility in session limited      Pertinent Vitals/Pain Pain Assessment: Faces Faces Pain Scale: Hurts a little bit Pain Location: ROM bil LE's Pain Descriptors / Indicators: Moaning Pain Intervention(s): Monitored during session    Home Living                      Prior Function            PT Goals (current goals can now be found in the care plan section) Acute Rehab PT Goals Patient Stated Goal: family would like her home PT Goal Formulation: With family Time For Goal Achievement: 09/10/20 Potential to Achieve Goals: Good Progress towards PT goals: Not progressing toward goals - comment (not responsive  at this point.)    Frequency    Min 2X/week      PT Plan Current plan remains appropriate    Co-evaluation              AM-PAC PT "6 Clicks" Mobility   Outcome Measure  Help needed turning from your back to your side while in a flat bed without using bedrails?: Total Help needed moving from lying on your back to sitting on the side of a flat bed without using bedrails?: Total Help needed moving to and from a bed to a chair (including a wheelchair)?: Total Help needed standing up from a chair using your arms (e.g., wheelchair or bedside chair)?: Total Help needed to walk in hospital room?: Total Help needed climbing 3-5 steps with a railing? : Total 6 Click Score: 6    End of Session   Activity Tolerance: Patient limited by lethargy Patient left: in bed;with call bell/phone within reach;with bed alarm set Nurse Communication: Mobility status PT Visit Diagnosis: Other abnormalities of gait and mobility  (R26.89);Muscle weakness (generalized) (M62.81)     Time: 5361-4431 PT Time Calculation (min) (ACUTE ONLY): 22 min  Charges:  $Therapeutic Activity: 8-22 mins                     09/03/2020  Ginger Carne., PT Acute Rehabilitation Services [phone]  (pager) [phone]  (office)   Tessie Fass Wister Hoefle 09/03/2020, 4:38 PM

## 2020-09-03 NOTE — Progress Notes (Signed)
Subjective: Lori Bridges started having seizures with right upper extremity twitching again this morning.  Lori Bridges and Lori Bridges at bedside.  ROS: Unable to obtain due to poor mental status  Examination  Vital signs in last 24 hours: Temp:  [97.8 F (36.6 C)-99.2 F (37.3 C)] 97.8 F (36.6 C) (09/06 1114) Pulse Rate:  [100-146] 116 (09/06 1114) Resp:  [19-27] 27 (09/06 1114) BP: (107-152)/(66-122) 135/93 (09/06 1114) SpO2:  [92 %-98 %] 94 % (09/06 1114) Weight:  [88.9 kg] 88.9 kg (09/06 0427)  General: lying in bed,not in apparent distress CVS: pulse-normal rate and rhythm RS: breathing comfortably,CTAB Extremities: normal,edema Neuro:Does not open eyes but sees "hmm" to verbal stimuli, does not follow commands, pupils equally round and reactive, no forced gaze deviation, spontaneously moving all 4 extremities in bed, right upper extremity subtle twitching mostly felt when hand placed on Lori right deltoid.  Basic Metabolic Panel: Recent Labs  Lab 08/29/20 0735 08/29/20 0735 08/30/20 0356 09/01/20 0915 09/03/20 0351  NA 139  --  138 135 137  K 4.7  --  4.7 4.2 3.8  CL 104  --  101 98 98  CO2 25  --  29 28 28   GLUCOSE 107*  --  119* 122* 127*  BUN 34*  --  35* 51* 57*  CREATININE 0.88  --  0.86 0.97 0.99  CALCIUM 9.7   < > 10.0 9.8 10.7*  MG 2.3  --   --   --   --   PHOS 2.8  --   --  2.7  --    < > = values in this interval not displayed.    CBC: Recent Labs  Lab 08/29/20 0735 09/02/20 0422  WBC 10.5 7.6  NEUTROABS 7.3  --   HGB 12.5 12.5  HCT 40.8 39.0  MCV 97.8 95.8  PLT 204 221     Coagulation Studies: No results for input(s): LABPROT, INR in the last 72 hours.  Imaging No new brain imaging overnight  ASSESSMENT AND PLAN:86 year-old female with recent left temporal hemorrhage (07/22/2020) and associated seizures who presented withfocal motorstatus epilepticus, now resolved, but with continuedevidence of epileptogenicity and  cortical dysfunction intheleft hemisphere.  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  Recommendations: -Had very long discussion with Lori Bridges [facility]) and daughter Lori Bridges at bedside along with palliative care NP Jinny Blossom.  -At this point, goal is to minimize seizures, get Lori Bridges to wake up enough to be able to take p.o. meals.  I discussed the possible risk of aspiration with p.o. diet which daughters understand.  -I will not add any further AEDs to stop seizures at this point as Lori Bridges was obtunded on high dose of AEDs.  However, if Lori Bridges starts having extensive jerking, we will use IV Ativan 2 mg as a rescue medication. -I will also start amantadine 100 mg twice daily for neuro stimulation -Continue current AEDs: Keppra 1000 mg twice daily, phenobarb 60 mg daily -Allow p.o. diet with pured food with aspiration precautions. -Also transition Lori Bridges to DNR/DNI.   I have spent a total of63minuteswith the Lori Bridges reviewing hospitalnotes, test results, labs and examining the Lori Bridges as well as establishing an assessment and plan that was discussed personally with the daughters, palliative NP Megan and Lori Bridges'sphysician Dr  Mary[place] Pink. 50% of time was spent in  direct Lori Bridges care.  Zeb Comfort Epilepsy [facility] For questions after 5pm please refer to AMION to reach the Neurologist on call

## 2020-09-03 NOTE — Progress Notes (Signed)
PROGRESS NOTE  Lori Bridges OVF:[id number] DOB: Jun 08, 1930 DOA: 08/18/2020 PCP: Derinda Late, MD  HPI/Recap of past 69 hours: 86 year old female past medical history of uncontrolled hypertension with recent cerebral hemorrhage requiring hospitalization, rehabilitation and then discharged home who was brought back to the hospital due to decreased level consciousness as well as right arm shaking.  Concern was for acute status epilepticus.  Neurology consulted and patient started on IV Keppra, IV Vimpat and phenobarbital.  Initially placed in ICU in case she had to be intubated for acute airway protection, but this was not needed.  Since then, patient has been able to be transferred out of ICU.  Recent MRI noted acute CVA.  Patient has remained sedated, felt to be in part due to her medications.  Neurology decreased Vimpat on 9/3 in hopes that patient will become more alert however she did not.  Neurology stopped her Vimpat altogether on morning of 9/6.  Patient looked to be slightly more awake, but then started having jerking arm movements again.  At this point, neurology and palliative care have extensive discussion with patient's daughter.  Patient has been changed to DNR.  Plan will be to continue her Keppra and phenobarbital with goals to minimize seizures and get her to be awake enough to take p.o. meals.  Family understands that there is a risk for aspiration.  Amantadine 100 twice daily also started for neurologic stimulation.  Patient does not improve, will likely head towards comfort care.  Assessment/Plan: Principal Problem: Seizure disorder with active seizure (HCC)/status epilepticus due to complex partial seizure: At this point patient becomes obtunded on too much seizure medication or starts to have breakthrough seizures.  Plan will be to monitor on phenobarbital and Keppra and hopefully patient will be awake enough to eat and not have breakthrough seizures.  If she does not recover,  will need comfort care. Active Problems:   Hyperlipidemia: Continue statin once awake enough to take pills.  Acute on chronic diastolic CHF (congestive heart failure) ([place]): Diuresing well.  Down 2.6 L since hospitalization.    ICH (intracerebral hemorrhage) (HCC) - 2 small L brain ICH, likely d/t HTN on subtherapeutic warfarin    CKD (chronic kidney disease), stage IIIa: Stable.  At baseline.    Hypothyroidism: Continue Synthroid.    Altered mental status/acute encephalopathy: Felt to be secondary to seizures and antiseizure medication.  Atrial fibrillation with rapid ventricular rate, permanent: Previously on Coumadin, but has been discontinued since development of intraparenchymal hemorrhage during last hospitalization.  On Cardizem drip for now.  Acute CVA: MRI noted acute subcentimeter right cerebellar infarct: Neurology following.  Obesity: Meets criteria BMI greater than 30  Code Status: Not changed to DNR  Family Communication: Daughter is both at bedside  Disposition Plan: Dependent on how patient does, skilled nursing versus hospice   Consultants:  Neurology  Palliative care  Procedures:  EEG  Antimicrobials:  None  DVT prophylaxis: SCDs   Objective: Vitals:   09/03/20 1114 09/03/20 1300  BP: (!) 135/93 134/90  Pulse: (!) 116 100  Resp: (!) 27 (!) 21  Temp: 97.8 F (36.6 C)   SpO2: 94%     Intake/Output Summary (Last 24 hours) at 09/03/2020 1442 Last data filed at 09/03/2020 1100 Gross per 24 hour  Intake 1510.92 ml  Output 1100 ml  Net 410.92 ml   Filed Weights   08/31/20 0325 09/02/20 0500 09/03/20 0427  Weight: 88.6 kg 92 kg 88.9 kg   Body mass index is  30.7 kg/m.  Exam:   General: Somnolent  Cardiovascular: Irregular rhythm, rate controlled  Respiratory: Clear to auscultation bilaterally  Abdomen: Soft, nontender, nondistended, positive bowel sounds  Musculoskeletal: No clubbing or cyanosis or edema  Skin: No skin breaks,  tears or lesions  Neurology: Right upper extremity jerking movements  Psychiatry: Currently obtunded, no evidence of acute psychoses   Data Reviewed: CBC: Recent Labs  Lab 08/29/20 0735 09/02/20 0422  WBC 10.5 7.6  NEUTROABS 7.3  --   HGB 12.5 12.5  HCT 40.8 39.0  MCV 97.8 95.8  PLT 204 169   Basic Metabolic Panel: Recent Labs  Lab 08/29/20 0735 08/30/20 0356 09/01/20 0915 09/03/20 0351  NA 139 138 135 137  K 4.7 4.7 4.2 3.8  CL 104 101 98 98  CO2 25 29 28 28   GLUCOSE 107* 119* 122* 127*  BUN 34* 35* 51* 57*  CREATININE 0.88 0.86 0.97 0.99  CALCIUM 9.7 10.0 9.8 10.7*  MG 2.3  --   --   --   PHOS 2.8  --  2.7  --    GFR: Estimated Creatinine Clearance: 45 mL/min (by C-G formula based on SCr of 0.99 mg/dL). Liver Function Tests: Recent Labs  Lab 08/29/20 0735 08/30/20 0356 09/01/20 0915  AST 45* 60*  --   ALT 24 29  --   ALKPHOS 144* 171*  --   BILITOT 1.0 1.0  --   PROT 5.8* 6.2*  --   ALBUMIN 2.3* 2.5* 3.1*   No results for input(s): LIPASE, AMYLASE in the last 168 hours. No results for input(s): AMMONIA in the last 168 hours. Coagulation Profile: No results for input(s): INR, PROTIME in the last 168 hours. Cardiac Enzymes: No results for input(s): CKTOTAL, CKMB, CKMBINDEX, TROPONINI in the last 168 hours. BNP (last 3 results) No results for input(s): PROBNP in the last 8760 hours. HbA1C: No results for input(s): HGBA1C in the last 72 hours. CBG: Recent Labs  Lab 09/02/20 2028 09/03/20 0023 09/03/20 0438 09/03/20 0755 09/03/20 1118  GLUCAP 115* 165* 124* 122* 109*   Lipid Profile: No results for input(s): CHOL, HDL, LDLCALC, TRIG, CHOLHDL, LDLDIRECT in the last 72 hours. Thyroid Function Tests: No results for input(s): TSH, T4TOTAL, FREET4, T3FREE, THYROIDAB in the last 72 hours. Anemia Panel: No results for input(s): VITAMINB12, FOLATE, FERRITIN, TIBC, IRON, RETICCTPCT in the last 72 hours. Urine analysis:    Component Value Date/Time     COLORURINE YELLOW 08/20/2020 0301   APPEARANCEUR CLEAR 08/20/2020 0301   LABSPEC 1.010 08/20/2020 0301   PHURINE 6.0 08/20/2020 0301   GLUCOSEU NEGATIVE 08/20/2020 0301   HGBUR NEGATIVE 08/20/2020 0301   BILIRUBINUR NEGATIVE 08/20/2020 0301   KETONESUR NEGATIVE 08/20/2020 0301   PROTEINUR NEGATIVE 08/20/2020 0301   UROBILINOGEN 0.2 04/03/2012 2219   NITRITE NEGATIVE 08/20/2020 0301   LEUKOCYTESUR NEGATIVE 08/20/2020 0301   Sepsis Labs: @LABRCNTIP (procalcitonin:4,lacticidven:4)  ) Recent Results (from the past 240 hour(s))  MRSA PCR Screening     Status: None   Collection Time: 08/27/20 12:07 PM   Specimen: Nasopharyngeal  Result Value Ref Range Status   MRSA by PCR NEGATIVE NEGATIVE Final    Comment:        The GeneXpert MRSA Assay (FDA approved for NASAL specimens only), is one component of a comprehensive MRSA colonization surveillance program. It is not intended to diagnose MRSA infection nor to guide or monitor treatment for MRSA infections. Performed at [facility], [place] [address] [place], [place] [address]  Studies: No results found.  Scheduled Meds: . amantadine  100 mg Per Tube BID  . apixaban  2.5 mg Per Tube BID  . chlorhexidine  15 mL Mouth Rinse BID  . Chlorhexidine Gluconate Cloth  6 each Topical Daily  . feeding supplement (PROSource TF)  45 mL Per Tube BID  . insulin aspart  0-9 Units Subcutaneous Q4H  . levETIRAcetam  1,000 mg Per Tube BID  . levothyroxine  75 mcg Per Tube Q0600  . mouth rinse  15 mL Mouth Rinse q12n4p  . metoprolol tartrate  75 mg Per Tube BID  . nutrition supplement (JUVEN)  1 packet Per Tube BID BM  . PHENObarbital  60 mg Per Tube QHS    Continuous Infusions: . diltiazem (CARDIZEM) infusion Stopped (08/27/20 0303)  . feeding supplement (OSMOLITE 1.2 CAL) 1,000 mL (09/03/20 1318)     LOS: 15 days     Annita Brod, MD [facility]   09/03/2020, 2:42 PM

## 2020-09-03 NOTE — Progress Notes (Signed)
Patient mews score is yellow, will do q2 vital signs.  Family is at bedside to discuss code status with case Freight forwarder.

## 2020-09-03 NOTE — Progress Notes (Signed)
Patient responded well to the rectal tylenol.  She is still a MEWS red due to her respirations and tachacardia

## 2020-09-03 NOTE — Plan of Care (Signed)
  Problem: Education: Goal: Expressions of having a comfortable level of knowledge regarding the disease process will increase 09/03/2020 1238 by Lurline Idol, RN Outcome: Progressing 09/03/2020 1237 by Lurline Idol, RN Outcome: Progressing   Problem: Coping: Goal: Ability to adjust to condition or change in health will improve 09/03/2020 1238 by Lurline Idol, RN Outcome: Progressing 09/03/2020 1237 by Lurline Idol, RN Outcome: Progressing Goal: Ability to identify appropriate support needs will improve 09/03/2020 1238 by Lurline Idol, RN Outcome: Progressing 09/03/2020 1237 by Lurline Idol, RN Outcome: Progressing   Problem: Health Behavior/Discharge Planning: Goal: Compliance with prescribed medication regimen will improve 09/03/2020 1238 by Lurline Idol, RN Outcome: Progressing 09/03/2020 1237 by Lurline Idol, RN Outcome: Progressing

## 2020-09-03 NOTE — Plan of Care (Signed)
  Problem: Education: Goal: Expressions of having a comfortable level of knowledge regarding the disease process will increase Outcome: Progressing   Problem: Coping: Goal: Ability to adjust to condition or change in health will improve Outcome: Progressing Goal: Ability to identify appropriate support needs will improve Outcome: Progressing   Problem: Health Behavior/Discharge Planning: Goal: Compliance with prescribed medication regimen will improve Outcome: Progressing   Problem: Medication: Goal: Risk for medication side effects will decrease Outcome: Progressing   Problem: Clinical Measurements: Goal: Complications related to the disease process, condition or treatment will be avoided or minimized Outcome: Progressing Goal: Diagnostic test results will improve Outcome: Progressing   Problem: Safety: Goal: Verbalization of understanding the information provided will improve Outcome: Progressing   Problem: Self-Concept: Goal: Level of anxiety will decrease Outcome: Progressing Goal: Ability to verbalize feelings about condition will improve Outcome: Progressing   Problem: Health Behavior/Discharge Planning: Goal: Ability to manage health-related needs will improve Outcome: Progressing   Problem: Clinical Measurements: Goal: Ability to maintain clinical measurements within normal limits will improve Outcome: Progressing Goal: Will remain free from infection Outcome: Progressing Goal: Diagnostic test results will improve Outcome: Progressing Goal: Respiratory complications will improve Outcome: Progressing Goal: Cardiovascular complication will be avoided Outcome: Progressing   Problem: Activity: Goal: Risk for activity intolerance will decrease Outcome: Progressing   Problem: Nutrition: Goal: Adequate nutrition will be maintained Outcome: Progressing   Problem: Coping: Goal: Level of anxiety will decrease Outcome: Progressing   Problem:  Elimination: Goal: Will not experience complications related to bowel motility Outcome: Progressing Goal: Will not experience complications related to urinary retention Outcome: Progressing   Problem: Pain Managment: Goal: General experience of comfort will improve Outcome: Progressing   Problem: Safety: Goal: Ability to remain free from injury will improve Outcome: Progressing   Problem: Skin Integrity: Goal: Risk for impaired skin integrity will decrease Outcome: Progressing

## 2020-09-03 NOTE — Progress Notes (Signed)
Daily Progress Note   Patient Name: Lori Bridges       Date: 09/03/2020 DOB: 15-Dec-1930  Age: 86 y.o. MRN#: [id number] Attending Physician: Lori Brod, MD Primary Care Physician: Lori Late, MD Admit Date: 08/18/2020  Reason for Consultation/Follow-up: Establishing goals of care  Subjective: Patient will open eyes to voice but remains lethargic. Does not follow commands and no verbal communication.   Dr. Hortense Bridges at bedside. Patient is actively seizing by evidence of RUE twitching. Daughter report they noticed this twitching since this AM when they arrived at bedside around 7:30am.   GOC:  Detailed GOC discussion with Dr. Hortense Bridges and two of patient's daughters including Lori Bridges (HCPOA) and Lori Bridges.   Dr. Hortense Bridges reviewed course of hospitalization including diagnoses, intervention, plan of care.  Patient has never previously spoke to family about her wishes. Following conversation with Lori Lessen NP, Lori Bridges has discussed code status with her family and they decided they do not wish for their mother to endure aggressive CPR.   The difference between aggressive medical intervention and comfort care was discussed. Advanced directives, concepts specific to code status, artifical feeding and hydration were discussed. Daughters agree with DNR/DNI code status. They wish to continue cortrak for now and attempt comfort feeds. We discussed dysphagia diet, aspiration risk, and aspiration precautions. Dr. Hortense Bridges will continue 2 AED's and start amantadine for neuro stimulation. Watchful waiting. See if patient perks up and will accept bites and sips. Daughters do seem to understand she is a poor candidate for rehab in her current state. They will not be able to bring her home following hospitalization with  this higher level of care. Briefly discussed comfort focused care and hospice facility, explaining that she would not continue to receive cortrak tube feedings if decision was made to focus on comfort, comfort feeds, and hospice transfer. Reassured of ongoing support from PMT. Answered questions and PMT contact information given.   Length of Stay: 15  Current Medications: Scheduled Meds:  . apixaban  2.5 mg Per Tube BID  . chlorhexidine  15 mL Mouth Rinse BID  . Chlorhexidine Gluconate Cloth  6 each Topical Daily  . feeding supplement (PROSource TF)  45 mL Per Tube BID  . insulin aspart  0-9 Units Subcutaneous Q4H  . levETIRAcetam  1,000 mg Per Tube BID  .  levothyroxine  75 mcg Per Tube Q0600  . mouth rinse  15 mL Mouth Rinse q12n4p  . metoprolol tartrate  75 mg Per Tube BID  . nutrition supplement (JUVEN)  1 packet Per Tube BID BM  . PHENObarbital  60 mg Per Tube QHS    Continuous Infusions: . diltiazem (CARDIZEM) infusion Stopped (08/27/20 0303)  . feeding supplement (OSMOLITE 1.2 CAL) 1,000 mL (09/02/20 1555)    PRN Meds: acetaminophen **OR** acetaminophen, labetalol, LORazepam, ondansetron **OR** ondansetron (ZOFRAN) IV, pantoprazole sodium  Physical Exam Vitals and nursing note reviewed.  HENT:     Head: Normocephalic and atraumatic.  Cardiovascular:     Rate and Rhythm: Rhythm irregularly irregular.  Pulmonary:     Effort: No tachypnea, accessory muscle usage or respiratory distress.  Skin:    General: Skin is warm and dry.     Comments: Anasarca  Neurological:     Mental Status: She is lethargic.     Comments: Opens eyes to voice but falls back to sleep. Does not follow commands. No verbal communication.   Psychiatric:        Attention and Perception: She is inattentive.        Speech: She is noncommunicative.        Cognition and Memory: Cognition is impaired.            Vital Signs: BP (!) 128/98   Pulse (!) 122   Temp 98.4 F (36.9 C) (Oral)   Resp 20    Ht 5\' 7"  (1.702 m)   Wt 88.9 kg   SpO2 94%   BMI 30.70 kg/m  SpO2: SpO2: 94 % O2 Device: O2 Device: Nasal Cannula O2 Flow Rate: O2 Flow Rate (L/min): 2 L/min  Intake/output summary:   Intake/Output Summary (Last 24 hours) at 09/03/2020 1109 Last data filed at 09/03/2020 0430 Gross per 24 hour  Intake 431.92 ml  Output 1600 ml  Net -1168.08 ml   LBM: Last BM Date: 09/02/20 Baseline Weight: Weight: 81.6 kg Most recent weight: Weight: 88.9 kg       Palliative Assessment/Data: PPS 30%    Flowsheet Rows     Most Recent Value  Intake Tab  Referral Department Hospitalist  Unit at Time of Referral ICU  Palliative Care Primary Diagnosis Neurology  Date Notified 08/24/20  Palliative Care Type New Palliative care  Reason for referral Clarify Goals of Care  Date of Admission 08/18/20  Date first seen by Palliative Care 08/25/20  # of days Palliative referral response time 1 Day(s)  # of days IP prior to Palliative referral 6  Clinical Assessment  Psychosocial & Spiritual Assessment  Palliative Care Outcomes      Patient Active Problem List   Diagnosis Date Noted  . Palliative care by specialist   . Altered mental status   . Pressure injury of skin 08/26/2020  . 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Lori Bridges)   . Seizure disorder ([place]) 08/19/2020  . Fall   . Peripheral edema   . Sleep disturbance   . Hyperkalemia   . Hyponatremia   . Leukopenia   . Atrial fibrillation ([place])   . Thrombocytopenia ([place])   . Hypernatremia   . Benign essential HTN   . Dysphagia, post-stroke   . PAF (paroxysmal atrial fibrillation) ([place])   . Goals of care, counseling/discussion   . Gastroesophageal reflux disease   . Seizure ([place]) 07/26/2020  . Dysphagia due to recent stroke 07/26/2020  . UTI (urinary tract infection) 07/26/2020  .  CKD (chronic kidney disease), stage IIIa 07/26/2020  . Hypothyroidism 07/26/2020  . ICH (intracerebral hemorrhage) (HCC) - 2 small L brain ICH,  likely d/t HTN on subtherapeutic warfarin 07/22/2020  . Chronic diastolic CHF (congestive heart failure) ([place]) 04/03/2017  . Pulmonary HTN ([place]) 04/03/2017  . Chest pain 04/03/2017  . Dizziness 06/30/2016  . Encounter for therapeutic drug monitoring 01/24/2014  . Hyperlipidemia 08/24/2013  . Long term (current) use of anticoagulants 05/17/2013  . Tricuspid regurgitation 03/04/2012  . Nausea 02/16/2012  . Personal history of colonic polyps 10/09/2011  . HYPERTENSION, MILD 09/11/2010  . Permanent atrial fibrillation ([place]) 07/23/2010    Palliative Care Assessment & Plan   Patient Profile: The patient is an 86 year old elderly chronically ill-appearing female with a past medical history significant for uncontrolled hypertension who had a recent cerebral hemorrhage requiring hospitalization and subsequent rehabilitation and then was discharged home using a utility walker but was unable to get around. She was brought back to the hospital due to decreased level of consciousness as well as shaking her right arm. Neurology was consulted and the neurologist started antiepileptics. There was concern for airway compromise so she was transferred to the ICU for closer monitoring and there were discussions regarding possible intubation considering multiple health issues. Patient was admitted with acute encephalopathy secondary to seizure activity and remained deeply sedated due to recurrent seizures. She was unable to follow commands. Neurology is following and recommending antiepileptics. She had a recent MRI which showed an acute CVA.  Assessment: Acute encephalopathy secondary to ? anti-epileptics Seizure activity Recent intracerebral hemorrhage Acute CVA Afib RVR Acute on chronic diastolic CHF Thrombocytopenia HTN  Recommendations/Plan:  Detailed GOC discussion with Dr. Hortense Bridges neurology and two daughters Lori Bridges [place] and [place]).  Daughters agree with code status change to DNR/DNI.  Continue  medical management. Watchful waiting. Neurology to continue Keppra and Phenobarb. Add amantadine to see if she perks up.   Comfort feeds. Start dysphagia diet. Daughters understand risk for aspiration but wish for her to attempt bites and sips. Continue cortrak for now. Aspiration precautions.   Briefly discussed hospice facility recommendation if her condition remains the same. Daughters will not be able to take her home in current state and she is a poor rehab candidate.   PMT will continue to follow. Ongoing palliative discussions pending clinical course. Lori Lessen NP will follow this week.  Code Status: DNR/DNI   Code Status Orders  (From admission, onward)         Start     Ordered   08/19/20 1318  Full code  Continuous        08/19/20 1319        Code Status History    Date Active Date Inactive Code Status Order ID Comments User Context   07/26/2020 1705 08/16/2020 1421 Full Code [id number]  Elizabeth Sauer Inpatient   07/26/2020 1705 07/26/2020 1705 Full Code [id number]  Elizabeth Sauer Inpatient   07/22/2020 1915 07/26/2020 1625 Full Code [id number]  Donnetta Simpers, MD ED   Advance Care Planning Activity       Prognosis:   Guarded  Discharge Planning:  To Be Determined  Care plan was discussed with RN, daughters Lori Bridges and Mariann Laster), Dr. Hortense Bridges, updated Dr. [place] Pink  Thank you for allowing the Palliative Medicine Team to assist in the care of this patient.   Total Time 50 Prolonged Time Billed  no    Greater than 50% of this time was spent counseling and coordinating  care related to the above assessment and plan.   Ihor Dow, DNP, FNP-C Palliative Medicine Team  Phone: [phone] Fax: [phone]  Please contact Palliative Medicine Team phone at [phone] for questions and concerns.

## 2020-09-04 ENCOUNTER — Inpatient Hospital Stay (HOSPITAL_COMMUNITY): Payer: Medicare Other

## 2020-09-04 LAB — BASIC METABOLIC PANEL
Anion gap: 9 (ref 5–15)
BUN: 51 mg/dL — ABNORMAL HIGH (ref 8–23)
CO2: 30 mmol/L (ref 22–32)
Calcium: 10.6 mg/dL — ABNORMAL HIGH (ref 8.9–10.3)
Chloride: 102 mmol/L (ref 98–111)
Creatinine, Ser: 0.84 mg/dL (ref 0.44–1.00)
GFR calc Af Amer: >60 mL/min (ref >60)
GFR calc non Af Amer: >60 mL/min (ref >60)
Glucose, Bld: 130 mg/dL — ABNORMAL HIGH (ref 70–99)
Potassium: 4.2 mmol/L (ref 3.5–5.1)
Sodium: 141 mmol/L (ref 135–145)

## 2020-09-04 LAB — GLUCOSE, CAPILLARY
Glucose-Capillary: 124 mg/dL — ABNORMAL HIGH (ref 70–99)
Glucose-Capillary: 126 mg/dL — ABNORMAL HIGH (ref 70–99)
Glucose-Capillary: 132 mg/dL — ABNORMAL HIGH (ref 70–99)
Glucose-Capillary: 143 mg/dL — ABNORMAL HIGH (ref 70–99)
Glucose-Capillary: 145 mg/dL — ABNORMAL HIGH (ref 70–99)
Glucose-Capillary: 169 mg/dL — ABNORMAL HIGH (ref 70–99)

## 2020-09-04 LAB — CBC
HCT: 38.2 % (ref 36.0–46.0)
Hemoglobin: 11.9 g/dL — ABNORMAL LOW (ref 12.0–15.0)
MCH: 30.1 pg (ref 26.0–34.0)
MCHC: 31.2 g/dL (ref 30.0–36.0)
MCV: 96.7 fL (ref 80.0–100.0)
Platelets: 233 10*3/uL (ref 150–400)
RBC: 3.95 MIL/uL (ref 3.87–5.11)
RDW: 15.6 % — ABNORMAL HIGH (ref 11.5–15.5)
WBC: 10.3 10*3/uL (ref 4.0–10.5)
nRBC: 0 % (ref 0.0–0.2)

## 2020-09-04 MED ORDER — SODIUM CHLORIDE 0.9 % IV SOLN
1000.0000 mg | Freq: Every day | INTRAVENOUS | Status: AC
  Administered 2020-09-04 – 2020-09-06 (×3): 1000 mg via INTRAVENOUS
  Filled 2020-09-04 (×3): qty 8

## 2020-09-04 MED ORDER — METOPROLOL TARTRATE 5 MG/5ML IV SOLN
5.0000 mg | Freq: Four times a day (QID) | INTRAVENOUS | Status: DC | PRN
  Administered 2020-09-04 – 2020-09-12 (×5): 5 mg via INTRAVENOUS
  Filled 2020-09-04 (×8): qty 5

## 2020-09-04 MED ORDER — METOPROLOL TARTRATE 25 MG/10 ML ORAL SUSPENSION
100.0000 mg | Freq: Two times a day (BID) | ORAL | Status: DC
  Administered 2020-09-04 – 2020-09-11 (×14): 100 mg
  Filled 2020-09-04 (×14): qty 40

## 2020-09-04 MED ORDER — METOPROLOL TARTRATE 5 MG/5ML IV SOLN
5.0000 mg | Freq: Once | INTRAVENOUS | Status: AC
  Administered 2020-09-04: 5 mg via INTRAVENOUS
  Filled 2020-09-04: qty 5

## 2020-09-04 NOTE — Progress Notes (Signed)
Patient MEWS Red. HR currently 138. Respiration rate: 30. Temp: 100.3. Charge RN made aware and MD [employer].

## 2020-09-04 NOTE — Progress Notes (Signed)
Occupational Therapy Treatment Patient Details Name: Lori Bridges MRN: [id number] DOB: 15-Feb-1930 Today's Date: 09/04/2020    History of present illness Lori Bridges is a 86 y.o. female with medical history significant of paroxysmal A. fib, chronic diastolic CHF, hypertension, hyperlipidemia, GERD, recent ICH, degenerative disc disease, CKD stage IIIa presents to emergency department with right-sided seizure-like activity. Pt just returned home on 8/19 from CIR s/p stroke affecting right side.   OT comments  Patient supine in bed, daughter present.  Patient able to follow minimal simple commands with increased time.  Opens eyes to locate and verbalize her daughters name without cueing, unable to maintain eyes open.  Able to follow commands for L UE AAROM exercises at shoulder, but fatigues and requires PROM for remainder of exercises.  Educated daughter on PROM exercises and positioning of UEs to reduce edema.  Continues to require total assist for self care at this time.  Will follow acutely.    Follow Up Recommendations  SNF;Supervision/Assistance - 24 hour    Equipment Recommendations  None recommended by OT    Recommendations for Other Services      Precautions / Restrictions Precautions Precautions: Fall Precaution Comments: edematous limbs. Restrictions Weight Bearing Restrictions: No       Mobility Bed Mobility               General bed mobility comments: chair position in bed to increased stimulation during session   Transfers                      Balance                                           ADL either performed or assessed with clinical judgement   ADL Overall ADL's : Needs assistance/impaired     Grooming: Wash/dry face;Total assistance;Bed level                                 General ADL Comments: total A     Vision   Additional Comments: able to open eyes and find daughter, able to scan towards L  but not towards R past midline; unable to sustain eyes open   Perception     Praxis      Cognition Arousal/Alertness: Lethargic Behavior During Therapy: Flat affect Overall Cognitive Status: Difficult to assess Area of Impairment: Following commands                       Following Commands: Follows one step commands inconsistently;Follows one step commands with increased time       General Comments: patient able to open eyes but unable to sustain, following simple 1 step commands but inconsistently         Exercises Other Exercises Other Exercises: ROM to B UEs from shoulders to hands, patient engaging in AAROM with L shoulder flexion only otherwise PROM (educated daughter as well)    Shoulder Instructions       General Comments daughter present and supportive, educated and daughter return demonstrated PROM of UEs for edema reduction    Pertinent Vitals/ Pain       Pain Assessment: Faces Faces Pain Scale: No hurt Pain Intervention(s): Monitored during session  Home Living  Prior Functioning/Environment              Frequency  Min 2X/week        Progress Toward Goals  OT Goals(current goals can now be found in the care plan section)  Progress towards OT goals: Progressing toward goals (slowly)  Acute Rehab OT Goals Patient Stated Goal: family would like her home OT Goal Formulation: With family  Plan Discharge plan remains appropriate    Co-evaluation                 AM-PAC OT "6 Clicks" Daily Activity     Outcome Measure   Help from another person eating meals?: Total Help from another person taking care of personal grooming?: Total Help from another person toileting, which includes using toliet, bedpan, or urinal?: Total Help from another person bathing (including washing, rinsing, drying)?: Total Help from another person to put on and taking off regular upper body  clothing?: Total Help from another person to put on and taking off regular lower body clothing?: Total 6 Click Score: 6    End of Session    OT Visit Diagnosis: Other abnormalities of gait and mobility (R26.89);Muscle weakness (generalized) (M62.81);Low vision, both eyes (H54.2);Other symptoms and signs involving cognitive function;Hemiplegia and hemiparesis   Activity Tolerance Patient limited by lethargy   Patient Left in bed;with call bell/phone within reach;with bed alarm set;with family/visitor present   Nurse Communication          Time: 1040-1059 OT Time Calculation (min): 19 min  Charges: OT General Charges $OT Visit: 1 Visit OT Treatments $Self Care/Home Management : 8-22 mins  Lori Bridges, [place] Pager [phone] Office [place] 09/04/2020, 1:32 PM

## 2020-09-04 NOTE — Progress Notes (Signed)
PROGRESS NOTE  Lori Bridges VVZ:[id number] DOB: 04-28-1930 DOA: 08/18/2020 PCP: Derinda Late, MD  HPI/Recap of past 25 hours: 86 year old female past medical history of uncontrolled hypertension with recent cerebral hemorrhage requiring hospitalization, rehabilitation and then discharged home who was brought back to the hospital due to decreased level consciousness as well as right arm shaking.  Concern was for acute status epilepticus.  Neurology consulted and patient started on IV Keppra, IV Vimpat and phenobarbital.  Initially placed in ICU in case she had to be intubated for acute airway protection, but this was not needed.  Since then, patient has been able to be transferred out of ICU.  Recent MRI noted acute CVA.  Patient has remained sedated, felt to be in part due to her medications.  Neurology decreased Vimpat on 9/3 in hopes that patient will become more alert however she did not.  Neurology stopped her Vimpat altogether on morning of 9/6.  Patient looked to be slightly more awake, but then started having jerking arm movements again.  At this point, neurology and palliative care have extensive discussion with patient's daughter.  Patient has been changed to DNR.  Plan will be to continue her Keppra and phenobarbital with goals to minimize seizures and get her to be awake enough to take p.o. meals.  Family understands that there is a risk for aspiration.  Amantadine 100 twice daily also started for neurologic stimulation.  Patient does not improve, will likely head towards comfort care.  Patient appears to be a bit more awake today, responded to verbal stimuli from neurologist and was able to relate her name.  Started on 3 days of Solu-Medrol as per neurology to help with seizure inflammation and hopefully this will help wake her up further.  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  Plan May 9/6 will be to monitor on phenobarbital and Keppra and hopefully patient will be awake enough to eat and not have breakthrough seizures.  Appears to be responding somewhat in a little bit more awake today.  Encouraging.  Started on 3 days of Solu-Medrol. Active Problems:   Hyperlipidemia: Continue statin through 2  Acute on chronic diastolic CHF (congestive heart failure) ([place]): Diuresing well.  Down over 2 L since hospitalization   ICH (intracerebral hemorrhage) (HCC) - 2 small L brain ICH, likely d/t HTN on subtherapeutic warfarin    CKD (chronic kidney disease), stage IIIa: Stable.  At baseline.    Hypothyroidism: Continue Synthroid.    Altered mental status/acute encephalopathy: Felt to be secondary to seizures and antiseizure medication.  Atrial fibrillation with rapid ventricular rate, permanent: Previously on Coumadin, but has been discontinued since development of intraparenchymal hemorrhage during last hospitalization.  Still tachycardic.  I have increased her metoprolol solution through core track tube  Acute CVA: MRI noted acute subcentimeter right cerebellar infarct: Neurology following.  Obesity: Meets criteria BMI greater than 30  Code Status: Not changed to DNR  Family Communication: Daughter is both at bedside  Disposition Plan: Dependent on how patient does, skilled nursing versus hospice   Consultants:  Neurology  Palliative care  Procedures:  EEG  Antimicrobials:  None  DVT prophylaxis: SCDs   Objective: Vitals:   09/04/20 0918 09/04/20 1113  BP: 111/65 115/75  Pulse: (!) 108 (!) 123  Resp:  (!) 21  Temp:  98.6 F (  37 C)  SpO2:  99%    Intake/Output Summary (Last 24 hours) at 09/04/2020 1319 Last data filed at 09/04/2020 0600 Gross per 24 hour  Intake 190 ml  Output 1350 ml  Net -1160 ml   Filed Weights   09/02/20 0500 09/03/20  0427 09/04/20 0350  Weight: 92 kg 88.9 kg 86.9 kg   Body mass index is 30.01 kg/m.  Exam:   General: Still somewhat somnolent for me although more awake for neurology  Cardiovascular: Irregular rhythm, tachycardic  Respiratory: Clear to auscultation bilaterally  Abdomen: Soft, nontender, nondistended, positive bowel sounds  Musculoskeletal: No clubbing or cyanosis or edema  Skin: No skin breaks, tears or lesions  Neurology: Right upper extremity jerking movements at times  Psychiatry: Currently obtunded, no evidence of acute psychoses   Data Reviewed: CBC: Recent Labs  Lab 08/29/20 0735 09/02/20 0422 09/04/20 0753  WBC 10.5 7.6 10.3  NEUTROABS 7.3  --   --   HGB 12.5 12.5 11.9*  HCT 40.8 39.0 38.2  MCV 97.8 95.8 96.7  PLT 204 221 409   Basic Metabolic Panel: Recent Labs  Lab 08/29/20 0735 08/30/20 0356 09/01/20 0915 09/03/20 0351 09/04/20 0753  NA 139 138 135 137 141  K 4.7 4.7 4.2 3.8 4.2  CL 104 101 98 98 102  CO2 25 29 28 28 30   GLUCOSE 107* 119* 122* 127* 130*  BUN 34* 35* 51* 57* 51*  CREATININE 0.88 0.86 0.97 0.99 0.84  CALCIUM 9.7 10.0 9.8 10.7* 10.6*  MG 2.3  --   --   --   --   PHOS 2.8  --  2.7  --   --    GFR: Estimated Creatinine Clearance: 52.4 mL/min (by C-G formula based on SCr of 0.84 mg/dL). Liver Function Tests: Recent Labs  Lab 08/29/20 0735 08/30/20 0356 09/01/20 0915  AST 45* 60*  --   ALT 24 29  --   ALKPHOS 144* 171*  --   BILITOT 1.0 1.0  --   PROT 5.8* 6.2*  --   ALBUMIN 2.3* 2.5* 3.1*   No results for input(s): LIPASE, AMYLASE in the last 168 hours. No results for input(s): AMMONIA in the last 168 hours. Coagulation Profile: No results for input(s): INR, PROTIME in the last 168 hours. Cardiac Enzymes: No results for input(s): CKTOTAL, CKMB, CKMBINDEX, TROPONINI in the last 168 hours. BNP (last 3 results) No results for input(s): PROBNP in the last 8760 hours. HbA1C: No results for input(s): HGBA1C in the last  72 hours. CBG: Recent Labs  Lab 09/03/20 1945 09/04/20 0003 09/04/20 0341 09/04/20 0740 09/04/20 1126  GLUCAP 110* 124* 126* 132* 143*   Lipid Profile: No results for input(s): CHOL, HDL, LDLCALC, TRIG, CHOLHDL, LDLDIRECT in the last 72 hours. Thyroid Function Tests: No results for input(s): TSH, T4TOTAL, FREET4, T3FREE, THYROIDAB in the last 72 hours. Anemia Panel: No results for input(s): VITAMINB12, FOLATE, FERRITIN, TIBC, IRON, RETICCTPCT in the last 72 hours. Urine analysis:    Component Value Date/Time   COLORURINE YELLOW 08/20/2020 0301   APPEARANCEUR CLEAR 08/20/2020 0301   LABSPEC 1.010 08/20/2020 0301   PHURINE 6.0 08/20/2020 0301   GLUCOSEU NEGATIVE 08/20/2020 0301   HGBUR NEGATIVE 08/20/2020 0301   BILIRUBINUR NEGATIVE 08/20/2020 0301   KETONESUR NEGATIVE 08/20/2020 0301   PROTEINUR NEGATIVE 08/20/2020 0301   UROBILINOGEN 0.2 04/03/2012 2219   NITRITE NEGATIVE 08/20/2020 0301   LEUKOCYTESUR NEGATIVE 08/20/2020 0301   Sepsis Labs: @LABRCNTIP (procalcitonin:4,lacticidven:4)  ) Recent Results (from  the past 240 hour(s))  MRSA PCR Screening     Status: None   Collection Time: 08/27/20 12:07 PM   Specimen: Nasopharyngeal  Result Value Ref Range Status   MRSA by PCR NEGATIVE NEGATIVE Final    Comment:        The GeneXpert MRSA Assay (FDA approved for NASAL specimens only), is one component of a comprehensive MRSA colonization surveillance program. It is not intended to diagnose MRSA infection nor to guide or monitor treatment for MRSA infections. Performed at [facility], [place] [address], [place], [place] [address]       Studies: No results found.  Scheduled Meds: . amantadine  100 mg Per Tube BID  . apixaban  2.5 mg Per Tube BID  . chlorhexidine  15 mL Mouth Rinse BID  . Chlorhexidine Gluconate Cloth  6 each Topical Daily  . feeding supplement (PROSource TF)  45 mL Per Tube BID  . insulin aspart  0-9 Units Subcutaneous Q4H  .  levETIRAcetam  1,000 mg Per Tube BID  . levothyroxine  75 mcg Per Tube Q0600  . mouth rinse  15 mL Mouth Rinse q12n4p  . metoprolol tartrate  100 mg Per Tube BID  . nutrition supplement (JUVEN)  1 packet Per Tube BID BM  . PHENObarbital  60 mg Per Tube QHS    Continuous Infusions: . feeding supplement (OSMOLITE 1.2 CAL) 1,000 mL (09/04/20 0909)  . methylPREDNISolone (SOLU-MEDROL) injection       LOS: 16 days     Annita Brod, MD [facility]   09/04/2020, 1:19 PM

## 2020-09-04 NOTE — Progress Notes (Signed)
Subjective: No clinical seizures this morning.  Patient's daughter at bedside.  States patient woke up and was able to communicate with her daughter.  Also walked a little bit with physical therapy.  Per RN, patient had right upper extremity twitching during patient care earlier today.  ROS: Unable to obtain due to poor mental status  Examination  Vital signs in last 24 hours: Temp:  [98.4 F (36.9 C)-101.2 F (38.4 C)] 98.6 F (37 C) (09/07 1113) Pulse Rate:  [59-123] 123 (09/07 1113) Resp:  [19-25] 21 (09/07 1113) BP: (108-128)/(65-89) 115/75 (09/07 1113) SpO2:  [96 %-99 %] 99 % (09/07 1113) Weight:  [86.9 kg] 86.9 kg (09/07 0350)  General: lying in bed, not in apparent distress CVS: pulse-normal rate and rhythm RS: breathing comfortably, CTA B Extremities: normal, positive edema Neuro: Opens eyes to verbal stimuli, did not follow commands, was able to tell me her name and that she is in the hospital but with significantly dysarthric speech, antigravity strength in all 4 extremities, no seizures noted on exam.  Basic Metabolic Panel: Recent Labs  Lab 08/29/20 0735 08/29/20 0735 08/30/20 0356 08/30/20 0356 09/01/20 0915 09/03/20 0351 09/04/20 0753  NA 139  --  138  --  135 137 141  K 4.7  --  4.7  --  4.2 3.8 4.2  CL 104  --  101  --  98 98 102  CO2 25  --  29  --  28 28 30   GLUCOSE 107*  --  119*  --  122* 127* 130*  BUN 34*  --  35*  --  51* 57* 51*  CREATININE 0.88  --  0.86  --  0.97 0.99 0.84  CALCIUM 9.7   < > 10.0   < > 9.8 10.7* 10.6*  MG 2.3  --   --   --   --   --   --   PHOS 2.8  --   --   --  2.7  --   --    < > = values in this interval not displayed.    CBC: Recent Labs  Lab 08/29/20 0735 09/02/20 0422 09/04/20 0753  WBC 10.5 7.6 10.3  NEUTROABS 7.3  --   --   HGB 12.5 12.5 11.9*  HCT 40.8 39.0 38.2  MCV 97.8 95.8 96.7  PLT 204 221 233     Coagulation Studies: No results for input(s): LABPROT, INR in the last 72 hours.  Imaging No new  brain imaging overnight  ASSESSMENT AND PLAN:86 year-old female with recent left temporal hemorrhage (07/22/2020) and associated seizures who presented withfocal motorstatus epilepticus, now resolved, but with continuedevidence of epileptogenicity and cortical dysfunction intheleft hemisphere.  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  Recommendations: -We will give IV Solu-Medrol 1000 mg for 3 days to minimize seizure related inflammation, edema -Continue Keppra 1000 mg twice daily and phenobarb 60 mg daily -Continue amantadine 100 mg twice daily for neuro stimulation -At this point, goal is to minimize seizures, get patient to wake up enough to be able to take p.o. meals.  I discussed the possible risk of aspiration with p.o. diet which daughters understand.  -I will not add any further AEDs to stop seizures at this point as patient was obtunded on high dose of AEDs.  However, if patient starts having  extensive jerking, we will use IV Ativan 2 mg as a rescue medication. -Allow p.o. diet with pured food with aspiration precautions.  I have spent a total of45minuteswith the patient reviewing hospitalnotes, test results, labs and examining the patient as well as establishing an assessment and plan that was discussed personally with the daughters, nurse and patient'sphysician Dr [place] Pink. 50% of time was spent in direct patient care.   Zeb Comfort Epilepsy [facility] For questions after 5pm please refer to AMION to reach the Neurologist on call

## 2020-09-05 LAB — CBC WITH DIFFERENTIAL/PLATELET
Abs Immature Granulocytes: 0.04 10*3/uL (ref 0.00–0.07)
Basophils Absolute: 0 10*3/uL (ref 0.0–0.1)
Basophils Relative: 0 %
Eosinophils Absolute: 0 10*3/uL (ref 0.0–0.5)
Eosinophils Relative: 0 %
HCT: 39 % (ref 36.0–46.0)
Hemoglobin: 11.9 g/dL — ABNORMAL LOW (ref 12.0–15.0)
Immature Granulocytes: 1 %
Lymphocytes Relative: 9 %
Lymphs Abs: 0.8 10*3/uL (ref 0.7–4.0)
MCH: 29.5 pg (ref 26.0–34.0)
MCHC: 30.5 g/dL (ref 30.0–36.0)
MCV: 96.8 fL (ref 80.0–100.0)
Monocytes Absolute: 0.8 10*3/uL (ref 0.1–1.0)
Monocytes Relative: 9 %
Neutro Abs: 6.6 10*3/uL (ref 1.7–7.7)
Neutrophils Relative %: 81 %
Platelets: 235 10*3/uL (ref 150–400)
RBC: 4.03 MIL/uL (ref 3.87–5.11)
RDW: 15.3 % (ref 11.5–15.5)
WBC: 8.2 10*3/uL (ref 4.0–10.5)
nRBC: 0 % (ref 0.0–0.2)

## 2020-09-05 LAB — COMPREHENSIVE METABOLIC PANEL
ALT: 80 U/L — ABNORMAL HIGH (ref 0–44)
AST: 101 U/L — ABNORMAL HIGH (ref 15–41)
Albumin: 3 g/dL — ABNORMAL LOW (ref 3.5–5.0)
Alkaline Phosphatase: 164 U/L — ABNORMAL HIGH (ref 38–126)
Anion gap: 9 (ref 5–15)
BUN: 60 mg/dL — ABNORMAL HIGH (ref 8–23)
CO2: 27 mmol/L (ref 22–32)
Calcium: 10.9 mg/dL — ABNORMAL HIGH (ref 8.9–10.3)
Chloride: 106 mmol/L (ref 98–111)
Creatinine, Ser: 0.89 mg/dL (ref 0.44–1.00)
GFR calc Af Amer: >60 mL/min (ref >60)
GFR calc non Af Amer: 58 mL/min — ABNORMAL LOW (ref >60)
Glucose, Bld: 131 mg/dL — ABNORMAL HIGH (ref 70–99)
Potassium: 4.2 mmol/L (ref 3.5–5.1)
Sodium: 142 mmol/L (ref 135–145)
Total Bilirubin: 1 mg/dL (ref 0.3–1.2)
Total Protein: 6.2 g/dL — ABNORMAL LOW (ref 6.5–8.1)

## 2020-09-05 LAB — GLUCOSE, CAPILLARY
Glucose-Capillary: 121 mg/dL — ABNORMAL HIGH (ref 70–99)
Glucose-Capillary: 129 mg/dL — ABNORMAL HIGH (ref 70–99)
Glucose-Capillary: 133 mg/dL — ABNORMAL HIGH (ref 70–99)
Glucose-Capillary: 168 mg/dL — ABNORMAL HIGH (ref 70–99)
Glucose-Capillary: 169 mg/dL — ABNORMAL HIGH (ref 70–99)
Glucose-Capillary: 190 mg/dL — ABNORMAL HIGH (ref 70–99)

## 2020-09-05 LAB — BRAIN NATRIURETIC PEPTIDE: B Natriuretic Peptide: 766.9 pg/mL — ABNORMAL HIGH (ref 0.0–100.0)

## 2020-09-05 MED ORDER — FUROSEMIDE 10 MG/ML IJ SOLN
40.0000 mg | Freq: Two times a day (BID) | INTRAMUSCULAR | Status: DC
  Administered 2020-09-06 – 2020-09-12 (×13): 40 mg via INTRAVENOUS
  Filled 2020-09-05 (×14): qty 4

## 2020-09-05 NOTE — Progress Notes (Signed)
Nutrition Follow-up  DOCUMENTATION CODES:   Not applicable  INTERVENTION:   -Continue dysphagia 1 diet with thin liquids for comfort -Continue tube feeding viaCortrak: Osmolite 1.2at 57m/h(1320ml per day) Prosource [id number]  Provides1664kcal, 95gm protein, 107074mree water daily  -1 packet Juven BID via Cortrak, each packet provides 95 calories, 2.5 grams of protein (collagen), and 9.8 grams of carbohydrate (3 grams sugar); also contains 7 grams of L-arginine and L-glutamine, 300 mg vitamin C, 15 mg vitamin E, 1.2 mcg vitamin B-12, 9.5 mg zinc, 200 mg calcium, and 1.5 g  Calcium Beta-hydroxy-Beta-methylbutyrate to support wound healing  NUTRITION DIAGNOSIS:   Inadequate oral intake related to inability to eat as evidenced by NPO status.  Ongoing  GOAL:   Patient will meet greater than or equal to 90% of their needs  Met with TF  MONITOR:   TF tolerance, Diet advancement  REASON FOR ASSESSMENT:   Consult Enteral/tube feeding initiation and management  ASSESSMENT:   Pt with PMH of uncontrolled HTN who recently was hospitalized with a ICH. Pt was discharged home after inpatient rehab stay. Now admitted with seizures.  8/27 Cortrak placed (gastric) 9/6- advanced to dysphagia 1 diet with thin liquids for comfort  Reviewed I/O's: -2.4 L since 08/22/20  Pt advanced to dysphagia 1 diet, however, intake minimal secondary to AMS. Observed breakfast tray- meal was untouched.   Plan to continue TF via cortrak for now. Pt receiving Osmolite 1.2 cal @ 54m46m, 45ml29msource TF BID. This provides1664kcal, 95gm protein, 1070ml 31m water daily, meeting 100% of estimated kcal and protein needs.   Palliative care following to discuss goals of care; leading towards SNF vs hospice.   Medications reviewed and include IV solu-medrol and phenobarbital.   Labs reviewed: CBGS: 121-168 (inpatient orders for glycemic control are 0-9 units insulin aspart every 4  hours).   Diet Order:   Diet Order            DIET - DYS 1 Room service appropriate? Yes; Fluid consistency: Thin  Diet effective now                 EDUCATION NEEDS:   No education needs have been identified at this time  Skin:  Skin Assessment: Skin Integrity Issues: Skin Integrity Issues:: Stage I Stage I: buttocks Other: MASD inner thighs  Last BM:  09/05/20  Height:   Ht Readings from Last 1 Encounters:  08/19/20 '5\' 7"'  (1.702 m)    Weight:   Wt Readings from Last 1 Encounters:  09/05/20 87.3 kg    Ideal Body Weight:  59.1 kg  BMI:  Body mass index is 30.13 kg/m.  Estimated Nutritional Needs:   Kcal:  1550-1750  Protein:  80-95 grams  Fluid:  >1.5 L/day    JenifeLoistine ChanceLDN, CDCES Registered Dietitian II Certified Diabetes Care and Education Specialist Please refer to AMION [facility] and/or RD on-call/weekend/after hours pager

## 2020-09-05 NOTE — Progress Notes (Signed)
Subjective: No acute events overnight.  No clinical seizure-like activity.  Patient's daughter [name] at bedside.  ROS: Unable to obtain due to poor mental status  Examination  Vital signs in last 24 hours: Temp:  [97.3 F (36.3 C)-100.3 F (37.9 C)] 98.3 F (36.8 C) (09/08 0728) Pulse Rate:  [64-145] 111 (09/08 0728) Resp:  [15-30] 17 (09/08 0728) BP: (104-129)/(53-85) 104/68 (09/08 0728) SpO2:  [95 %-99 %] 97 % (09/08 0728) Weight:  [87.3 kg] 87.3 kg (09/08 0449)  General: lying in bed, not in apparent distress CVS: pulse-normal rate and rhythm RS: breathing comfortably, CTA B Extremities: normal, positive edema Neuro: Opens eyes to verbal stimuli, follows commands like showing two fingers, able to do simple math like 2+2 is equal to four, able to tell me her name and that she is in the hospital, antigravity strength in all 4 extremities, no seizures noted on exam.  Basic Metabolic Panel: Recent Labs  Lab 08/30/20 0356 08/30/20 0356 09/01/20 0915 09/01/20 0915 09/03/20 0351 09/04/20 0753 09/05/20 1020  NA 138  --  135  --  137 141 142  K 4.7  --  4.2  --  3.8 4.2 4.2  CL 101  --  98  --  98 102 106  CO2 29  --  28  --  28 30 27   GLUCOSE 119*  --  122*  --  127* 130* 131*  BUN 35*  --  51*  --  57* 51* 60*  CREATININE 0.86  --  0.97  --  0.99 0.84 0.89  CALCIUM 10.0   < > 9.8   < > 10.7* 10.6* 10.9*  PHOS  --   --  2.7  --   --   --   --    < > = values in this interval not displayed.    CBC: Recent Labs  Lab 09/02/20 0422 09/04/20 0753 09/05/20 1020  WBC 7.6 10.3 8.2  NEUTROABS  --   --  6.6  HGB 12.5 11.9* 11.9*  HCT 39.0 38.2 39.0  MCV 95.8 96.7 96.8  PLT 221 233 235     Coagulation Studies: No results for input(s): LABPROT, INR in the last 72 hours.  Imaging No new brain imaging overnight  ASSESSMENT AND PLAN:86 year-old female with recent left temporal hemorrhage (07/22/2020) and associated seizures who presented withfocal motorstatus  epilepticus, now resolved, but with continuedevidence of epileptogenicity and cortical dysfunction intheleft hemisphere.  Focalepilepsy with breakthrough seizures Recentleft temporal ICH (July 2021) Acute encephalopathy, due to seizures and medications(improving) Acute right cerebellar infarct and subacute left parietal infarct -No clinical seizures overnight   Recommendations: -Continue IV Solu-Medrol 1000 mg day 2/3 to minimize seizure related inflammation, edema -Continue Keppra 1000 mg twice daily and phenobarb 60 mg daily -Continue amantadine 100 mg twice daily for neuro stimulation -At this point, goal is to minimize seizures, get patient to wake up enough to be able to take p.o. meals. I discussed the possible risk of aspiration with p.o. diet which daughters understand.  -I will not add any further AEDs to stop seizures at this point as patient was obtunded on high dose of AEDs. However, if patient starts having extensive jerking, we will use IV Ativan 2 mg as a rescue medication. -Allow p.o. diet with pured food with aspiration precautions.  I have spent a total of34minuteswith the patient reviewing hospitalnotes, test results, labs and examining the patient as well as establishing an assessment and plan that was discussed personally  with thedaughter,  nurse andpatient'sphysician DrCaldwell. 50% of time was spent in direct patient care.    Zeb Comfort Epilepsy [facility] For questions after 5pm please refer to AMION to reach the Neurologist on call

## 2020-09-05 NOTE — Progress Notes (Signed)
PROGRESS NOTE    [name]  VWU:[id number] DOB: 1930/02/04 DOA: 08/18/2020 PCP: Derinda Late, MD   Chief Complaint  Patient presents with  . involuntary R sided movements  . Abdominal Pain    Brief Narrative: 86 year old female past medical history of uncontrolled hypertension with recent cerebral hemorrhage requiring hospitalization, rehabilitation and then discharged home who was brought back to the hospital due to decreased level consciousness as well as right arm shaking.  Concern was for acute status epilepticus.  Neurology consulted and patient started on IV Keppra, IV Vimpat and phenobarbital.  Initially placed in ICU in case she had to be intubated for acute airway protection, but this was not needed.  Since then, patient has been able to be transferred out of ICU.  Recent MRI noted acute CVA.  Patient has remained sedated, felt to be in part due to her medications.  Neurology decreased Vimpat on 9/3 in hopes that patient will become more alert however she did not.  Neurology stopped her Vimpat altogether on morning of 9/6.  Patient looked to be slightly more awake, but then started having jerking arm movements again.  At this point, neurology and palliative care have extensive discussion with patient's daughter.  Patient has been changed to DNR.  Plan will be to continue her Keppra and phenobarbital with goals to minimize seizures and get her to be awake enough to take p.o. meals.  Family understands that there is [name] risk for aspiration.  Amantadine 100 twice daily also started for neurologic stimulation.  Patient does not improve, will likely head towards comfort care.  Patient appears to be Lori Bridges bit more awake today, responded to verbal stimuli from neurologist and was able to relate her name.  Started on 3 days of Solu-Medrol as per neurology to help with seizure inflammation and hopefully this will help wake her up further.    Assessment & Plan:   Principal Problem:    Seizure ([place]) Active Problems:   Hyperlipidemia   Chronic diastolic CHF (congestive heart failure) (HCC)   ICH (intracerebral hemorrhage) (HCC) - 2 small L brain ICH, likely d/t HTN on subtherapeutic warfarin   CKD (chronic kidney disease), stage IIIa   Hypothyroidism   PAF (paroxysmal atrial fibrillation) (HCC)   Goals of care, counseling/discussion   Gastroesophageal reflux disease   Seizure disorder ([place])   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[place])   Pressure injury of skin   Palliative care by specialist   Altered mental status   DNR (do not resuscitate)  Seizure disorder with active seizure (HCC)/status epilepticus due to complex partial seizure:  Neurology c/s, appreciate recommendations Pt obtunded on high dose AED's, continue keppra 1000 mg BID and phenobarb 60 mg daily as well as amantadine 100 mg BID for neurostimulation.  No further AED's as pt obtunded on high doses.  Ativan available prn. Continue solumedrol and follow response  PO diet with aspiration precautions She was [name]&Ox 1-2 today (said she was at doctor in response to me asking where she was).  Continue to monitor  Acute on chronic diastolic CHF (congestive heart failure) ([place]):  Echo from 05/2020 with EF 29-56%, diastolic function could not be evaluated Gross bilateral LE edema Lasix for tomorrow (was awaiting labs today) CXR Strict I/O, daily weights  ICH (intracerebral hemorrhage) (HCC)  Acute Subcentimeter R Cerebellar Infarct - 2 small L brain ICH, likely d/t HTN on subtherapeutic warfarin.  Also with acute subcentimeter R cerebellar infarct.  Will discuss with neurology.  CKD (chronic kidney disease), stage IIIa: Stable.  At baseline.  Hypothyroidism: Continue Synthroid.  Altered mental status/acute encephalopathy: Felt to be secondary to seizures and antiseizure medication.  Atrial fibrillation with rapid ventricular rate, permanent: Previously on Coumadin, but has been discontinued  since development of intraparenchymal hemorrhage during last hospitalization.  She's now on low dose eliquis.  Follow on metoprolol.    Acute CVA: MRI noted acute subcentimeter right cerebellar infarct: Neurology following.  As above, will need to discuss whether any workup.  Obesity: Meets criteria BMI greater than 30  DVT prophylaxis: eliquis Code Status: DNR Family Communication: none at bedside Disposition:   Status is: Inpatient  Remains inpatient appropriate because:Inpatient level of care appropriate due to severity of illness   Dispo:  Patient From:  home  Planned Disposition: Residential Hospice vs SNF pending patients progress  Expected discharge date: 09/07/20  Medically stable for discharge:   no   Consultants:   Neurology  Palliative care  Procedures:  EEG  Antimicrobials:  Anti-infectives (From admission, onward)   None      Subjective: Answers questions intermittently [name]&Ox~2  Objective: Vitals:   09/05/20 0449 09/05/20 0600 09/05/20 0726 09/05/20 0728  BP:  104/64  104/68  Pulse:  (!) 131 (!) 112 (!) 111  Resp:  18 15 17   Temp:    98.3 F (36.8 C)  TempSrc:    Axillary  SpO2:  95% 97% 97%  Weight: 87.3 kg     Height:       No intake or output data in the 24 hours ending 09/05/20 2041 Filed Weights   09/03/20 0427 09/04/20 0350 09/05/20 0449  Weight: 88.9 kg 86.9 kg 87.3 kg    Examination:  General exam: Appears calm and comfortable  Respiratory system: Clear to auscultation. Respiratory effort normal. Cardiovascular system: irreg irregu Gastrointestinal system: Abdomen is nondistended, soft and nontender.  Central nervous system: Alert and oriented x1-2. Follows commands inconsistently Extremities: bilateral LE edema Skin: No rashes, lesions or ulcers Psychiatry: Judgement and insight appear normal. Mood & affect appropriate.     Data Reviewed: I have personally reviewed following labs and imaging studies  CBC: Recent Labs    Lab September 20, 2020 0422 09/04/20 0753 09/05/20 1020  WBC 7.6 10.3 8.2  NEUTROABS  --   --  6.6  HGB 12.5 11.9* 11.9*  HCT 39.0 38.2 39.0  MCV 95.8 96.7 96.8  PLT 221 233 993    Basic Metabolic Panel: Recent Labs  Lab 08/30/20 0356 09/01/20 0915 09/03/20 0351 09/04/20 0753 09/05/20 1020  NA 138 135 137 141 142  K 4.7 4.2 3.8 4.2 4.2  CL 101 98 98 102 106  CO2 29 28 28 30 27   GLUCOSE 119* 122* 127* 130* 131*  BUN 35* 51* 57* 51* 60*  CREATININE 0.86 0.97 0.99 0.84 0.89  CALCIUM 10.0 9.8 10.7* 10.6* 10.9*  PHOS  --  2.7  --   --   --     GFR: Estimated Creatinine Clearance: 49.6 mL/min (by C-G formula based on SCr of 0.89 mg/dL).  Liver Function Tests: Recent Labs  Lab 08/30/20 0356 09/01/20 0915 09/05/20 1020  AST 60*  --  101*  ALT 29  --  80*  ALKPHOS 171*  --  164*  BILITOT 1.0  --  1.0  PROT 6.2*  --  6.2*  ALBUMIN 2.5* 3.1* 3.0*    CBG: Recent Labs  Lab 09/05/20 0001 09/05/20 0359 09/05/20 0734 09/05/20 1155 09/05/20 1556  GLUCAP  190* 133* 129* 121* 168*     Recent Results (from the past 240 hour(s))  MRSA PCR Screening     Status: None   Collection Time: 08/27/20 12:07 PM   Specimen: Nasopharyngeal  Result Value Ref Range Status   MRSA by PCR NEGATIVE NEGATIVE Final    Comment:        The GeneXpert MRSA Assay (FDA approved for NASAL specimens only), is one component of Tashan Kreitzer comprehensive MRSA colonization surveillance program. It is not intended to diagnose MRSA infection nor to guide or monitor treatment for MRSA infections. Performed at [facility], [place] [address] [place], [place] [address]          Radiology Studies: DG Chest 1 View  Result Date: 09/04/2020 CLINICAL DATA:  Shortness of breath at rest. EXAM: CHEST  1 VIEW COMPARISON:  Single-view of the chest 08/26/2020. FINDINGS: Right pleural effusion and basilar atelectasis have improved. No left effusion is seen in the left lung appears clear. There is cardiomegaly.  Aortic atherosclerosis is seen. No pneumothorax. Feeding tube courses into the stomach and below the inferior margin the film. IMPRESSION: Decreased right pleural effusion and basilar atelectasis since the prior examination. No new abnormality. Cardiomegaly without edema. Electronically Signed   By: Inge Rise M.D.   On: 09/04/2020 16:42        Scheduled Meds: . amantadine  100 mg Per Tube BID  . apixaban  2.5 mg Per Tube BID  . chlorhexidine  15 mL Mouth Rinse BID  . Chlorhexidine Gluconate Cloth  6 each Topical Daily  . feeding supplement (PROSource TF)  45 mL Per Tube BID  . insulin aspart  0-9 Units Subcutaneous Q4H  . levETIRAcetam  1,000 mg Per Tube BID  . levothyroxine  75 mcg Per Tube Q0600  . mouth rinse  15 mL Mouth Rinse q12n4p  . metoprolol tartrate  100 mg Per Tube BID  . nutrition supplement (JUVEN)  1 packet Per Tube BID BM  . PHENObarbital  60 mg Per Tube QHS   Continuous Infusions: . feeding supplement (OSMOLITE 1.2 CAL) 1,000 mL (09/05/20 0517)  . methylPREDNISolone (SOLU-MEDROL) injection 1,000 mg (09/05/20 0928)     LOS: 17 days    Time spent: over 4 min    Fayrene Helper, MD [facility]   To contact the attending provider between 7A-7P or the covering provider during after hours 7P-7A, please log into the web site [email or web address] and access using universal Ray password for that web site. If you do not have the password, please call the hospital operator.  09/05/2020, 8:41 PM

## 2020-09-05 NOTE — Progress Notes (Signed)
Physical Therapy Treatment Patient Details Name: Lori Bridges MRN: [id number] DOB: 02-May-1930 Today's Date: 09/05/2020    History of Present Illness Lori Bridges is a 86 y.o. female with medical history significant of paroxysmal A. fib, chronic diastolic CHF, hypertension, hyperlipidemia, GERD, recent ICH, degenerative disc disease, CKD stage IIIa presents to emergency department with right-sided seizure-like activity. Pt just returned home on 8/19 from CIR s/p stroke affecting right side.    PT Comments    Pt lethargic, but more alert and participative if stimulated.  Emphasis on warm up ROM and resistance when pt able at bil U and LE's and transition to EOB with work on sitting balance, progressing truncal activation, upright sitting.  Pt became less alert and was brought back to supine and repositioned in chair position with the bed.    Follow Up Recommendations  SNF     Equipment Recommendations  None recommended by PT;Other (comment)    Recommendations for Other Services       Precautions / Restrictions Precautions Precautions: Fall    Mobility  Bed Mobility Overal bed mobility: Needs Assistance Bed Mobility: Supine to Sit;Sit to Supine     Supine to sit: Total assist;+2 for physical assistance Sit to supine: Total assist;+2 for physical assistance   General bed mobility comments: pt need full assist to EOB, but attained some muscle activation to assist minimally to return to supine.  Transfers                 General transfer comment: not appropriat today with +1  Ambulation/Gait             General Gait Details: unable at this time   Stairs             Wheelchair Mobility    Modified Rankin (Stroke Patients Only) Modified Rankin (Stroke Patients Only) Pre-Morbid Rankin Score: Moderately severe disability Modified Rankin: Severe disability     Balance Overall balance assessment: Needs assistance Sitting-balance support: Feet  supported;Bilateral upper extremity supported Sitting balance-Leahy Scale: Poor Sitting balance - Comments: pt able to maintain balance today with bil UE assist.  Sat EOB working on leaving and returning to midline and coming into and out ouf upright posture.                                    Cognition Arousal/Alertness: Lethargic Behavior During Therapy: Flat affect Overall Cognitive Status: Impaired/Different from baseline (NT formally)                                        Exercises Other Exercises Other Exercises: AA to mild resistance x10 reps with bil bicep/tricep presses with shoulder assist Other Exercises: hip/knee flex/ext ROM with graded resistance in gross extension.    General Comments General comments (skin integrity, edema, etc.): vss      Pertinent Vitals/Pain Pain Assessment: Faces Faces Pain Scale: Hurts a little bit Pain Location: ROM bil LE's Pain Descriptors / Indicators: Moaning Pain Intervention(s): Monitored during session    Home Living                      Prior Function            PT Goals (current goals can now be found in the care plan section) Acute Rehab  PT Goals Patient Stated Goal: family would like her home PT Goal Formulation: With family Time For Goal Achievement: 09/10/20 Potential to Achieve Goals: Good Progress towards PT goals: Progressing toward goals    Frequency    Min 2X/week      PT Plan Current plan remains appropriate    Co-evaluation              AM-PAC PT "6 Clicks" Mobility   Outcome Measure  Help needed turning from your back to your side while in a flat bed without using bedrails?: Total Help needed moving from lying on your back to sitting on the side of a flat bed without using bedrails?: Total Help needed moving to and from a bed to a chair (including a wheelchair)?: Total Help needed standing up from a chair using your arms (e.g., wheelchair or bedside  chair)?: Total Help needed to walk in hospital room?: Total Help needed climbing 3-5 steps with a railing? : Total 6 Click Score: 6    End of Session   Activity Tolerance: Patient limited by lethargy;Patient tolerated treatment well Patient left: in bed;with call bell/phone within reach;with bed alarm set Nurse Communication: Mobility status PT Visit Diagnosis: Other abnormalities of gait and mobility (R26.89);Muscle weakness (generalized) (M62.81)     Time: 4193-7902 PT Time Calculation (min) (ACUTE ONLY): 27 min  Charges:  $Therapeutic Exercise: 8-22 mins $Therapeutic Activity: 8-22 mins                     09/05/2020  Lori Carne., PT Acute Rehabilitation Services [phone]  (pager) [phone]  (office)   Lori Bridges 09/05/2020, 6:07 PM

## 2020-09-05 NOTE — Progress Notes (Signed)
Patient ID: Lori Bridges, female   DOB: 1930-01-24, 86 y.o.   MRN: [id number]  This NP reviewed medical records, received report from team and then assessed patient at the bedside as a follow up to for palliative medicine needs and emotional support.  Patient remains lethargic and minimally responsive to  tactile stimulation and verbal stimuli.  Patient remains on core track for nutritional support, taking very little POs  I reached out  by phone to  daughter [name] who is healthcare power of attorney, for ongoing support from the palliative medicine team.  Continue conversation regarding current medical situation; patient remains encephalopathic secondary to history of intracranial hemorrhage, seizure activity and antiepileptics medications; attempting to control seizure activity. No noted seizure activity today.   Patient with other comorbidities of acute on chronic diastolic congestive heart failure, thrombocytopenia, hypertension, GERD, hypothyroid, and she is with advanced age 37 years old.  Today is day 74 of this hospitalization  Family face ongoing treatment option decisions, advanced directive decisions, and anticipatory care needs.  Daughter verbalized frustration and agitation regarding phone call  "I don't understand do you want me to put my mother to sleep"  Therapeutic listening and emotional support.    Concept of Palliative Care was re-introduced as specialized medical care for people and their families living with serious illness.  If focuses on providing relief from the symptoms and stress of a serious illness.  The goal is to improve quality of life for both the patient and the family.   The difference between a aggressive medical intervention path  and a palliative comfort care path for this patient at this time was had.    A full comfort path was detailed.  Values and goals of care important to patient and family were attempted to be elicited.  I tried to assure  [name] that this conversation is about open conversation/education regarding a patient/family's options and decisions in medical difficult situations.     I let her know that she could decline any further interactions with palliative if that was best for her family but she asks for me to re-contact with family again tomorrow.  Total time spent on the unit was 35 minutes  Greater than 50% of the time was spent in counseling and coordination of care  [name] NP  Palliative Medicine Team Team Phone # [phone] Pager [phone]

## 2020-09-06 ENCOUNTER — Inpatient Hospital Stay (HOSPITAL_COMMUNITY): Payer: Medicare Other

## 2020-09-06 DIAGNOSIS — R627 Adult failure to thrive: Secondary | ICD-10-CM

## 2020-09-06 DIAGNOSIS — I610 Nontraumatic intracerebral hemorrhage in hemisphere, subcortical: Secondary | ICD-10-CM

## 2020-09-06 DIAGNOSIS — R638 Other symptoms and signs concerning food and fluid intake: Secondary | ICD-10-CM

## 2020-09-06 LAB — GLUCOSE, CAPILLARY
Glucose-Capillary: 113 mg/dL — ABNORMAL HIGH (ref 70–99)
Glucose-Capillary: 118 mg/dL — ABNORMAL HIGH (ref 70–99)
Glucose-Capillary: 138 mg/dL — ABNORMAL HIGH (ref 70–99)
Glucose-Capillary: 163 mg/dL — ABNORMAL HIGH (ref 70–99)
Glucose-Capillary: 172 mg/dL — ABNORMAL HIGH (ref 70–99)
Glucose-Capillary: 184 mg/dL — ABNORMAL HIGH (ref 70–99)
Glucose-Capillary: 191 mg/dL — ABNORMAL HIGH (ref 70–99)

## 2020-09-06 LAB — COMPREHENSIVE METABOLIC PANEL
ALT: 76 U/L — ABNORMAL HIGH (ref 0–44)
AST: 93 U/L — ABNORMAL HIGH (ref 15–41)
Albumin: 2.7 g/dL — ABNORMAL LOW (ref 3.5–5.0)
Alkaline Phosphatase: 153 U/L — ABNORMAL HIGH (ref 38–126)
Anion gap: 11 (ref 5–15)
BUN: 64 mg/dL — ABNORMAL HIGH (ref 8–23)
CO2: 24 mmol/L (ref 22–32)
Calcium: 10.5 mg/dL — ABNORMAL HIGH (ref 8.9–10.3)
Chloride: 106 mmol/L (ref 98–111)
Creatinine, Ser: 0.85 mg/dL (ref 0.44–1.00)
GFR calc Af Amer: >60 mL/min (ref >60)
GFR calc non Af Amer: >60 mL/min (ref >60)
Glucose, Bld: 95 mg/dL (ref 70–99)
Potassium: 5.1 mmol/L (ref 3.5–5.1)
Sodium: 141 mmol/L (ref 135–145)
Total Bilirubin: 1 mg/dL (ref 0.3–1.2)
Total Protein: 5.7 g/dL — ABNORMAL LOW (ref 6.5–8.1)

## 2020-09-06 LAB — CBC WITH DIFFERENTIAL/PLATELET
Abs Immature Granulocytes: 0.05 10*3/uL (ref 0.00–0.07)
Basophils Absolute: 0 10*3/uL (ref 0.0–0.1)
Basophils Relative: 0 %
Eosinophils Absolute: 0 10*3/uL (ref 0.0–0.5)
Eosinophils Relative: 0 %
HCT: 39.1 % (ref 36.0–46.0)
Hemoglobin: 12 g/dL (ref 12.0–15.0)
Immature Granulocytes: 1 %
Lymphocytes Relative: 6 %
Lymphs Abs: 0.4 10*3/uL — ABNORMAL LOW (ref 0.7–4.0)
MCH: 29.6 pg (ref 26.0–34.0)
MCHC: 30.7 g/dL (ref 30.0–36.0)
MCV: 96.5 fL (ref 80.0–100.0)
Monocytes Absolute: 0.5 10*3/uL (ref 0.1–1.0)
Monocytes Relative: 8 %
Neutro Abs: 5.7 10*3/uL (ref 1.7–7.7)
Neutrophils Relative %: 85 %
Platelets: UNDETERMINED 10*3/uL (ref 150–400)
RBC: 4.05 MIL/uL (ref 3.87–5.11)
RDW: 15.2 % (ref 11.5–15.5)
WBC: 6.8 10*3/uL (ref 4.0–10.5)
nRBC: 0 % (ref 0.0–0.2)

## 2020-09-06 LAB — MAGNESIUM: Magnesium: 2.7 mg/dL — ABNORMAL HIGH (ref 1.7–2.4)

## 2020-09-06 LAB — BRAIN NATRIURETIC PEPTIDE: B Natriuretic Peptide: 760.9 pg/mL — ABNORMAL HIGH (ref 0.0–100.0)

## 2020-09-06 LAB — PHOSPHORUS: Phosphorus: 2.3 mg/dL — ABNORMAL LOW (ref 2.5–4.6)

## 2020-09-06 MED ORDER — AMANTADINE HCL 50 MG/5ML PO SYRP
100.0000 mg | ORAL_SOLUTION | Freq: Two times a day (BID) | ORAL | Status: DC
  Administered 2020-09-06 – 2020-09-11 (×10): 100 mg
  Filled 2020-09-06 (×13): qty 10

## 2020-09-06 NOTE — Progress Notes (Signed)
Patient ID: Lori Bridges, female   DOB: May 27, 1930, 86 y.o.   MRN: [id number]  This NP reviewed medical records, received report from team and then assessed patient at the bedside as a follow up to for palliative medicine needs and emotional support.  Concept of Palliative Care was re-introduced as specialized medical care for people and their families living with serious illness.  If focuses on providing relief from the symptoms and stress of a serious illness.  The goal is to improve quality of life for both the patient and the family.     Patient remains lethargic and minimally responsive. t Patient remains on core track for nutritional support, taking very little POs  Daughter [name] at bedside   Continue conversation regarding current medical situation; patient remains encephalopathic secondary to history of intracranial hemorrhage, seizure activity and antiepileptics medications; attempting to control seizure activity. No noted seizure activity today.  Family face ongoing treatment option decisions, advanced directive decisions, and anticipatory care needs.  Therapeutic listening and emotional support.  The difference between a aggressive medical intervention path  and a palliative comfort care path for this patient at this time was had.    I worry that there is a disconnect when talking about "comfort care"    A full comfort path was detailed.  Specifically we discussed discontinuation of the core-trac  and allowing the patient  to eat and drink what she can as long as she can knowing that it is unlikely that she can support herself nutrionally.    Hospice philosophy presented.   Questions and concerns addressed.  [place] booklet left for review.   [name] and I also discussed making [name] the first contact giving her sister [name] some space ( she recently lost her husband)     Please call [name] with questions.      This family work closely and communicate on all  issues  Values and goals of care important to patient and family were attempted to be elicited.   Discussed with Dr Florene Glen indetail  Total time spent on the unit was 35 minutes  Greater than 50% of the time was spent in counseling and coordination of care  This nurse practitioner informed  the family and the attending that I will be out of the hospital until Monday morning.  If the patient is still hospitalized I will follow-up at that time.  Call palliative medicine team phone # [phone] with questions or concerns in the interim.   [name] NP  Palliative Medicine Team Team Phone # [phone] Pager [phone]

## 2020-09-06 NOTE — Plan of Care (Signed)
  Problem: Health Behavior/Discharge Planning: Goal: Compliance with prescribed medication regimen will improve Outcome: Progressing   Problem: Coping: Goal: Ability to adjust to condition or change in health will improve Outcome: Progressing   Problem: Education: Goal: Expressions of having a comfortable level of knowledge regarding the disease process will increase Outcome: Progressing

## 2020-09-06 NOTE — Progress Notes (Signed)
Subjective: No clinical seizures overnight.  No new concerns.  Patient denies any concerns.  ROS: negative except above  Examination  Vital signs in last 24 hours: Temp:  [97.6 F (36.4 C)-98.6 F (37 C)] 97.6 F (36.4 C) (09/09 0745) Pulse Rate:  [93-116] 114 (09/09 1001) Resp:  [14-20] 14 (09/09 0745) BP: (114-127)/(66-87) 119/81 (09/09 1001) SpO2:  [96 %-99 %] 96 % (09/09 0745) Weight:  [85.6 kg] 85.6 kg (09/09 0435)  General: lying in bed,not in apparent distress CVS: pulse-normal rate and rhythm RS: breathing comfortably,CTA B Extremities: normal,positive edema Neuro:AAO x2 (not oriented to time), able to name 2/3 objects pen, thumb and correctly identified the third 1 (key) after giving options, antigravity strength in all 4 extremities, no seizures noted on exam.  Basic Metabolic Panel: Recent Labs  Lab 09/01/20 0915 09/01/20 0915 09/03/20 0351 09/03/20 0351 09/04/20 0753 09/05/20 1020 09/06/20 0450  NA 135  --  137  --  141 142 141  K 4.2  --  3.8  --  4.2 4.2 5.1  CL 98  --  98  --  102 106 106  CO2 28  --  28  --  30 27 24   GLUCOSE 122*  --  127*  --  130* 131* 95  BUN 51*  --  57*  --  51* 60* 64*  CREATININE 0.97  --  0.99  --  0.84 0.89 0.85  CALCIUM 9.8   < > 10.7*   < > 10.6* 10.9* 10.5*  MG  --   --   --   --   --   --  2.7*  PHOS 2.7  --   --   --   --   --  2.3*   < > = values in this interval not displayed.    CBC: Recent Labs  Lab 09/02/20 0422 09/04/20 0753 09/05/20 1020 09/06/20 0450  WBC 7.6 10.3 8.2 6.8  NEUTROABS  --   --  6.6 5.7  HGB 12.5 11.9* 11.9* 12.0  HCT 39.0 38.2 39.0 39.1  MCV 95.8 96.7 96.8 96.5  PLT 221 233 235 PLATELET CLUMPS NOTED ON SMEAR, UNABLE TO ESTIMATE     Coagulation Studies: No results for input(s): LABPROT, INR in the last 72 hours.  Imaging No new brain imaging overnight  ASSESSMENT AND PLAN:86 year-old female with recent left temporal hemorrhage (07/22/2020) and associated seizures who  presented withfocal motorstatus epilepticus, now resolved, but with continuedevidence of epileptogenicity and cortical dysfunction intheleft hemisphere.  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 meals. I have discussed the possible risk of aspiration with p.o. diet which daughters understand.  -I will not add any further AEDs to stop seizures at this point as patient was obtunded on high dose of AEDs. However, if patient starts having extensive jerking, we will use IV Ativan 2 mg as a rescue medication. -Allow p.o. diet with pured food with aspiration precautions. -Follow-up with neurology in 8 to 12 weeks. -Seizure precautions  Thank you for allowing [name] to participate in  the care of this patient.  Neurology will sign off.  Please call us if you have any further questions.  I have spent a total of57minuteswith the patient reviewing hospitalnotes, test results, labs and examining the patient as well as establishing an assessment and plan that was discussed personally with the nurseandpatient'sphysician DrCaldwell. 50% of time was spent in direct patient care.   Zeb Comfort Epilepsy [facility] For questions after 5pm please refer to AMION to reach the Neurologist on call

## 2020-09-06 NOTE — Progress Notes (Signed)
PROGRESS NOTE    Lori Bridges  ZHG:[id number] DOB: 03-02-1930 DOA: 08/18/2020 PCP: Derinda Late, MD   Chief Complaint  Patient presents with  . involuntary R sided movements  . Abdominal Pain    Brief Narrative: 86 year old female past medical history of uncontrolled hypertension with recent cerebral hemorrhage requiring hospitalization, rehabilitation and then discharged home who was brought back to the hospital due to decreased level consciousness as well as right arm shaking.  Concern was for acute status epilepticus.  Neurology consulted and patient started on IV Keppra, IV Vimpat and phenobarbital.  Initially placed in ICU in case she had to be intubated for acute airway protection, but this was not needed.  Since then, patient has been able to be transferred out of ICU.  Recent MRI noted acute CVA.  Patient has remained sedated, felt to be in part due to her medications.  Neurology decreased Vimpat on 9/3 in hopes that patient will become more alert however she did not.  Neurology stopped her Vimpat altogether on morning of 9/6.  Patient looked to be slightly more awake, but then started having jerking arm movements again.  At this point, neurology and palliative care have extensive discussion with patient's daughter.  Patient has been changed to DNR.  Plan will be to continue her Keppra and phenobarbital with goals to minimize seizures and get her to be awake enough to take p.o. meals.  Family understands that there is Sheldon Sem risk for aspiration.  Amantadine 100 twice daily also started for neurologic stimulation.  Patient does not improve, will likely head towards comfort care.  Patient appears to be Yazmeen Woolf bit more awake today, responded to verbal stimuli from neurologist and was able to relate her name.  Started on 3 days of Solu-Medrol as per neurology to help with seizure inflammation and hopefully this will help wake her up further.    Assessment & Plan:   Principal Problem:    Seizure ([place]) Active Problems:   Hyperlipidemia   Chronic diastolic CHF (congestive heart failure) (HCC)   ICH (intracerebral hemorrhage) (HCC) - 2 small L brain ICH, likely d/t HTN on subtherapeutic warfarin   CKD (chronic kidney disease), stage IIIa   Hypothyroidism   PAF (paroxysmal atrial fibrillation) (HCC)   Goals of care, counseling/discussion   Gastroesophageal reflux disease   Seizure disorder ([place])   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[place])   Pressure injury of skin   Palliative care by specialist   Altered mental status   DNR (do not resuscitate)  Goals of Care:  Overall, Mrs. Ellenbecker's prognosis is poor.  further discussions needed with family regarding overall goals of care.  Appreciate Marg from palliative.  I've asked her to see if she can arrange another family meeting.  At this point in time, they're not interested in hospice based on my conversation with family present today.   Seizure disorder with active seizure (HCC)/status epilepticus due to complex partial seizure:  Neurology c/s, appreciate recommendations Pt obtunded on high dose AED's, continue keppra 1000 mg BID and phenobarb 60 mg daily as well as amantadine 100 mg BID for neurostimulation.  No further AED's as pt obtunded on high doses.  Ativan available prn. S/p solumedrol x3 days PO diet with aspiration precautions She was Dayveon Halley&Ox 2 today (self, location).  Continue to monitor  Acute on chronic diastolic CHF (congestive heart failure) ([place]):  Echo from 05/2020 with EF 19-62%, diastolic function could not be evaluated At least 2+ bilateral LE  edema Continue lasix BID CXR - pulmonary vascular congestion and perihilar edema, small effusions Strict I/O, daily weights  ICH (intracerebral hemorrhage) (HCC)  Acute Subcentimeter R Cerebellar Infarct - 2 small L brain ICH, likely d/t HTN on subtherapeutic warfarin.  Also with acute subcentimeter R cerebellar infarct.  No additional w/u per  neurology.  CKD (chronic kidney disease), stage IIIa: Stable.  At baseline.  Hypothyroidism: Continue Synthroid.  Altered mental status/acute encephalopathy: Felt to be secondary to seizures and antiseizure medication.  Atrial fibrillation with rapid ventricular rate, permanent: Previously on Coumadin, but has been discontinued since development of intraparenchymal hemorrhage during last hospitalization.  She's now on low dose eliquis per neurology.  Follow on metoprolol.    Acute CVA: MRI noted acute subcentimeter right cerebellar infarct: Neurology following.  No additional w/u per neurology.  Obesity: Meets criteria BMI greater than 30  DVT prophylaxis: eliquis Code Status: DNR Family Communication: daughter Disposition:   Status is: Inpatient  Remains inpatient appropriate because:Inpatient level of care appropriate due to severity of illness   Dispo:  Patient From:  home  Planned Disposition: pending further [place] discussions  Expected discharge date: 09/14/20  Medically stable for discharge:   no   Consultants:   Neurology  Palliative care  Procedures:  EEG  Antimicrobials:  Anti-infectives (From admission, onward)   None      Subjective: No complainst Nalea Salce&Ox2  Objective: Vitals:   09/06/20 0745 09/06/20 1001 09/06/20 1322 09/06/20 1522  BP: 120/77 119/81 116/77 117/85  Pulse: (!) 108 (!) 114 72 (!) 110  Resp: 14  20 20   Temp: 97.6 F (36.4 C)  97.9 F (36.6 C) 97.7 F (36.5 C)  TempSrc: Oral  Oral Axillary  SpO2: 96%  97% 97%  Weight:      Height:        Intake/Output Summary (Last 24 hours) at 09/06/2020 1802 Last data filed at 09/06/2020 1644 Gross per 24 hour  Intake 758.92 ml  Output 1500 ml  Net -741.08 ml   Filed Weights   09/05/20 0449 09/06/20 0434 09/06/20 0435  Weight: 87.3 kg 85.6 kg 85.6 kg    Examination:  General: No acute distress. Cardiovascular: Heart sounds show Linnet Bottari regular rate, and rhythm. Lungs: Clear to  auscultation bilaterally  Abdomen: Soft, nontender, nondistended Neurological: Alert and oriented 2. Moves all extremities 4. Cranial nerves II through XII grossly intact. Skin: Warm and dry. No rashes or lesions. Extremities: bilateral LE edema     Data Reviewed: I have personally reviewed following labs and imaging studies  CBC: Recent Labs  Lab 09/02/20 0422 09/04/20 0753 09/05/20 1020 09/06/20 0450  WBC 7.6 10.3 8.2 6.8  NEUTROABS  --   --  6.6 5.7  HGB 12.5 11.9* 11.9* 12.0  HCT 39.0 38.2 39.0 39.1  MCV 95.8 96.7 96.8 96.5  PLT 221 233 235 PLATELET CLUMPS NOTED ON SMEAR, UNABLE TO ESTIMATE    Basic Metabolic Panel: Recent Labs  Lab 09/01/20 0915 09/03/20 0351 09/04/20 0753 09/05/20 1020 09/06/20 0450  NA 135 137 141 142 141  K 4.2 3.8 4.2 4.2 5.1  CL 98 98 102 106 106  CO2 28 28 30 27 24   GLUCOSE 122* 127* 130* 131* 95  BUN 51* 57* 51* 60* 64*  CREATININE 0.97 0.99 0.84 0.89 0.85  CALCIUM 9.8 10.7* 10.6* 10.9* 10.5*  MG  --   --   --   --  2.7*  PHOS 2.7  --   --   --  2.3*    GFR: Estimated Creatinine Clearance: 51.4 mL/min (by C-G formula based on SCr of 0.85 mg/dL).  Liver Function Tests: Recent Labs  Lab 09/01/20 0915 09/05/20 1020 09/06/20 0450  AST  --  101* 93*  ALT  --  80* 76*  ALKPHOS  --  164* 153*  BILITOT  --  1.0 1.0  PROT  --  6.2* 5.7*  ALBUMIN 3.1* 3.0* 2.7*    CBG: Recent Labs  Lab 09/06/20 0013 09/06/20 0304 09/06/20 0748 09/06/20 1207 09/06/20 1640  GLUCAP 172* 138* 118* 113* 163*     No results found for this or any previous visit (from the past 240 hour(s)).       Radiology Studies: DG CHEST PORT 1 VIEW  Result Date: 09/06/2020 CLINICAL DATA:  Heart failure EXAM: PORTABLE CHEST 1 VIEW COMPARISON:  09/04/2020 FINDINGS: Cardiac enlargement with vascular congestion. Perihilar infiltrates likely edema. Small pleural effusions. An enteric tube is present with tip off the field of view but below the left  hemidiaphragm. Calcified aorta. IMPRESSION: Cardiac enlargement with pulmonary vascular congestion and perihilar edema. Small pleural effusions. Electronically Signed   By: Lucienne Capers M.D.   On: 09/06/2020 06:40        Scheduled Meds: . amantadine  100 mg Per Tube BID  . apixaban  2.5 mg Per Tube BID  . chlorhexidine  15 mL Mouth Rinse BID  . Chlorhexidine Gluconate Cloth  6 each Topical Daily  . feeding supplement (PROSource TF)  45 mL Per Tube BID  . furosemide  40 mg Intravenous BID  . insulin aspart  0-9 Units Subcutaneous Q4H  . levETIRAcetam  1,000 mg Per Tube BID  . levothyroxine  75 mcg Per Tube Q0600  . mouth rinse  15 mL Mouth Rinse q12n4p  . metoprolol tartrate  100 mg Per Tube BID  . nutrition supplement (JUVEN)  1 packet Per Tube BID BM  . PHENObarbital  60 mg Per Tube QHS   Continuous Infusions: . feeding supplement (OSMOLITE 1.2 CAL) 1,000 mL (09/06/20 0423)     LOS: 18 days    Time spent: over 30 min    Fayrene Helper, MD [facility]   To contact the attending provider between 7A-7P or the covering provider during after hours 7P-7A, please log into the web site [email or web address] and access using universal [place] password for that web site. If you do not have the password, please call the hospital operator.  09/06/2020, 6:02 PM

## 2020-09-07 DIAGNOSIS — R638 Other symptoms and signs concerning food and fluid intake: Secondary | ICD-10-CM

## 2020-09-07 LAB — CBC WITH DIFFERENTIAL/PLATELET
Abs Immature Granulocytes: 0.07 10*3/uL (ref 0.00–0.07)
Basophils Absolute: 0 10*3/uL (ref 0.0–0.1)
Basophils Relative: 0 %
Eosinophils Absolute: 0 10*3/uL (ref 0.0–0.5)
Eosinophils Relative: 0 %
HCT: 42 % (ref 36.0–46.0)
Hemoglobin: 13.3 g/dL (ref 12.0–15.0)
Immature Granulocytes: 1 %
Lymphocytes Relative: 8 %
Lymphs Abs: 0.7 10*3/uL (ref 0.7–4.0)
MCH: 30.3 pg (ref 26.0–34.0)
MCHC: 31.7 g/dL (ref 30.0–36.0)
MCV: 95.7 fL (ref 80.0–100.0)
Monocytes Absolute: 0.8 10*3/uL (ref 0.1–1.0)
Monocytes Relative: 10 %
Neutro Abs: 6.3 10*3/uL (ref 1.7–7.7)
Neutrophils Relative %: 81 %
Platelets: 274 10*3/uL (ref 150–400)
RBC: 4.39 MIL/uL (ref 3.87–5.11)
RDW: 15.4 % (ref 11.5–15.5)
WBC: 7.8 10*3/uL (ref 4.0–10.5)
nRBC: 0 % (ref 0.0–0.2)

## 2020-09-07 LAB — COMPREHENSIVE METABOLIC PANEL
ALT: 71 U/L — ABNORMAL HIGH (ref 0–44)
AST: 64 U/L — ABNORMAL HIGH (ref 15–41)
Albumin: 2.8 g/dL — ABNORMAL LOW (ref 3.5–5.0)
Alkaline Phosphatase: 153 U/L — ABNORMAL HIGH (ref 38–126)
Anion gap: 8 (ref 5–15)
BUN: 72 mg/dL — ABNORMAL HIGH (ref 8–23)
CO2: 31 mmol/L (ref 22–32)
Calcium: 10.4 mg/dL — ABNORMAL HIGH (ref 8.9–10.3)
Chloride: 103 mmol/L (ref 98–111)
Creatinine, Ser: 0.96 mg/dL (ref 0.44–1.00)
GFR calc Af Amer: >60 mL/min (ref >60)
GFR calc non Af Amer: 53 mL/min — ABNORMAL LOW (ref >60)
Glucose, Bld: 132 mg/dL — ABNORMAL HIGH (ref 70–99)
Potassium: 4.2 mmol/L (ref 3.5–5.1)
Sodium: 142 mmol/L (ref 135–145)
Total Bilirubin: 0.6 mg/dL (ref 0.3–1.2)
Total Protein: 5.4 g/dL — ABNORMAL LOW (ref 6.5–8.1)

## 2020-09-07 LAB — GLUCOSE, CAPILLARY
Glucose-Capillary: 127 mg/dL — ABNORMAL HIGH (ref 70–99)
Glucose-Capillary: 134 mg/dL — ABNORMAL HIGH (ref 70–99)
Glucose-Capillary: 145 mg/dL — ABNORMAL HIGH (ref 70–99)
Glucose-Capillary: 94 mg/dL (ref 70–99)
Glucose-Capillary: 110 mg/dL — ABNORMAL HIGH (ref 70–99)

## 2020-09-07 LAB — MAGNESIUM: Magnesium: 2.4 mg/dL (ref 1.7–2.4)

## 2020-09-07 LAB — PHOSPHORUS: Phosphorus: 2.5 mg/dL (ref 2.5–4.6)

## 2020-09-07 MED ORDER — SODIUM CHLORIDE 0.9% FLUSH
10.0000 mL | Freq: Two times a day (BID) | INTRAVENOUS | Status: DC
  Administered 2020-09-07 – 2020-09-13 (×12): 10 mL

## 2020-09-07 MED ORDER — SODIUM CHLORIDE 0.9% FLUSH: 10.0000 mL | INTRAVENOUS | Status: DC | PRN

## 2020-09-07 NOTE — Progress Notes (Signed)
PROGRESS NOTE    Lori Bridges  TDV:[id number] DOB: May 14, 1930 DOA: 08/18/2020 PCP: Derinda Late, MD   Chief Complaint  Patient presents with  . involuntary R sided movements  . Abdominal Pain    Brief Narrative: 86 year old female past medical history of uncontrolled hypertension with recent cerebral hemorrhage requiring hospitalization, rehabilitation and then discharged home who was brought back to the hospital due to decreased level consciousness as well as right arm shaking.  Concern was for acute status epilepticus.  Neurology consulted and patient started on IV Keppra, IV Vimpat and phenobarbital.  Initially placed in ICU in case she had to be intubated for acute airway protection, but this was not needed.  Since then, patient has been able to be transferred out of ICU.  Recent MRI noted acute CVA.  Patient has remained sedated, felt to be in part due to her medications.  Neurology decreased Vimpat on 9/3 in hopes that patient will become more alert however she did not.  Neurology stopped her Vimpat altogether on morning of 9/6.  Patient looked to be slightly more awake, but then started having jerking arm movements again.  At this point, neurology and palliative care have extensive discussion with patient's daughter.  Patient has been changed to DNR.  Plan will be to continue her Keppra and phenobarbital with goals to minimize seizures and get her to be awake enough to take p.o. meals.  Family understands that there is Harald Quevedo risk for aspiration.  Amantadine 100 twice daily also started for neurologic stimulation.  If patient did not improved, the plan was for comfort measures, but there's some disconnect I think between providers and family with previous plans regarding "comfort care".  When I discussed plans going forward with Lori Bridges, one of the daughters, she noted, that hospice is off the table. I've asked palliative care to help [name] arrange another family meeting to continue to work on  disposition.  She continues to have feeding tube in place, which we'll keep until more definitive plans for nexts steps are in place.  Continue to encourage PO.  Assessment & Plan:   Principal Problem:   Seizure ([place]) Active Problems:   Hyperlipidemia   Chronic diastolic CHF (congestive heart failure) (HCC)   ICH (intracerebral hemorrhage) (HCC) - 2 small L brain ICH, likely d/t HTN on subtherapeutic warfarin   CKD (chronic kidney disease), stage IIIa   Hypothyroidism   PAF (paroxysmal atrial fibrillation) (HCC)   Goals of care, counseling/discussion   Gastroesophageal reflux disease   Seizure disorder ([place])   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[place])   Pressure injury of skin   Palliative care by specialist   Altered mental status   DNR (do not resuscitate)   Adult failure to thrive   Poor fluid intake  Goals of Care:  Overall, Lori Bridges's prognosis is poor.  further discussions needed with family regarding overall goals of care.  I think there's [name] disconnect when discussing "comfort care".  Lori Bridges has mentioned to me that "hospice is off the table".  Appreciate [name].  I've asked her to see if she can arrange another family meeting, hopefully on Monday.  Will maintain cortrak for now.  Seizure disorder with active seizure (HCC)/status epilepticus due to complex partial seizure:  Neurology c/s, appreciate recommendations - now signed off Pt obtunded on high dose AED's, continue keppra 1000 mg BID and phenobarb 60 mg daily as well as amantadine 100 mg BID for neurostimulation.  No further  AED's as pt obtunded on high doses.  Ativan available prn. Goal is to minimize seizures and allow pt to wake enough to be able to take PO meals S/p solumedrol x3 days PO diet with aspiration precautions Continue to monitor Follow with neurology outpatient  Acute on chronic diastolic CHF (congestive heart failure) ([place]):  Echo from 05/2020 with EF 39-53%, diastolic  function could not be evaluated At least 2+ bilateral LE edema Continue lasix BID CXR - pulmonary vascular congestion and perihilar edema, small effusions Strict I/O, daily weights  ICH (intracerebral hemorrhage) (HCC)  Acute Subcentimeter R Cerebellar Infarct - 2 small L brain ICH, likely d/t HTN on subtherapeutic warfarin (now on eliquis, ok per neuro).  Also with acute subcentimeter R cerebellar infarct.  No additional w/u per neurology.  CKD (chronic kidney disease), stage IIIa: Stable.  At baseline.  Hypothyroidism: Continue Synthroid.  Altered mental status/acute encephalopathy: Felt to be secondary to seizures and antiseizure medication.  Atrial fibrillation with rapid ventricular rate, permanent: Previously on Coumadin, but has been discontinued since development of intraparenchymal hemorrhage during last hospitalization.  She's now on low dose eliquis per neurology.  Follow on metoprolol.    Acute CVA: MRI noted acute subcentimeter right cerebellar infarct: Neurology following.  No additional w/u per neurology.  Obesity: Meets criteria BMI greater than 30  DVT prophylaxis: eliquis Code Status: DNR Family Communication: daughters 9/10 Disposition:   Status is: Inpatient  Remains inpatient appropriate because:Inpatient level of care appropriate due to severity of illness   Dispo:  Patient From:  home  Planned Disposition: pending further [place] discussions  Expected discharge date: 09/14/20  Medically stable for discharge:   no   Consultants:   Neurology  Palliative care  Procedures:  EEG 8/25 ABNORMALITY -Focal motor seizure, left hemisphere -PLEds with overriding fast activity, left hemisphere -Continuous slow, generalized and maximal left parieto-occipital region  IMPRESSION: This study showed focal motor seizure arising from left hemisphere. PLEDs with overriding fast activity were also noted which is on the ictal-interictal continuum.  Additionally,  there is evidence of cortical dysfunctioninleft hemisphere, maximal left parieto-occipital region secondary to underlying structural abnormality.   8/23 ABNORMALITY -Periodic lateralized epileptiform discharges, left hemisphere -Continuous slow, generalized and maximal left parieto-occipital region  IMPRESSION: This study evidence of epileptogenicity as well as cortical dysfunction in left hemisphere, maximal left parieto-occipital region secondary to underlying structural abnormality. No seizures were seen throughout the recording.  8/27 cortrak Antimicrobials:  Anti-infectives (From admission, onward)   None      Subjective: No complaints today Lethargic, Osher Oettinger&Ox2 Objective: Vitals:   09/06/20 2302 09/07/20 0323 09/07/20 0852 09/07/20 1510  BP: 103/68 109/71 121/90 113/81  Pulse: (!) 104 (!) 104  95  Resp: 19 15  (!) 23  Temp: 97.8 F (36.6 C) 97.9 F (36.6 C) 98.2 F (36.8 C) (!) 97.5 F (36.4 C)  TempSrc: Oral Axillary Oral Oral  SpO2: 98% 96%  100%  Weight:  86.1 kg    Height:        Intake/Output Summary (Last 24 hours) at 09/07/2020 1808 Last data filed at 09/07/2020 0500 Gross per 24 hour  Intake 170 ml  Output 1200 ml  Net -1030 ml   Filed Weights   09/06/20 0434 09/06/20 0435 09/07/20 0323  Weight: 85.6 kg 85.6 kg 86.1 kg    Examination:  General: No acute distress. Cardiovascular: irregularly irregular.  Lungs: Clear to auscultation bilaterally  Abdomen: Soft, nontender, nondistended Neurological: lethargic, follows some commands, Owen Pratte&Ox2 Skin: Warm  and dry. No rashes or lesions. Extremities: No clubbing or cyanosis. Bilateral LE edema, improving    Data Reviewed: I have personally reviewed following labs and imaging studies  CBC: Recent Labs  Lab 09/02/20 0422 09/04/20 0753 09/05/20 1020 09/06/20 0450 09/07/20 1443  WBC 7.6 10.3 8.2 6.8 7.8  NEUTROABS  --   --  6.6 5.7 6.3  HGB 12.5 11.9* 11.9* 12.0 13.3  HCT 39.0 38.2 39.0 39.1 42.0    MCV 95.8 96.7 96.8 96.5 95.7  PLT 221 233 235 PLATELET CLUMPS NOTED ON SMEAR, UNABLE TO ESTIMATE 093    Basic Metabolic Panel: Recent Labs  Lab 09/01/20 0915 09/01/20 0915 09/03/20 0351 09/04/20 0753 09/05/20 1020 09/06/20 0450 09/07/20 0204  NA 135   < > 137 141 142 141 142  K 4.2   < > 3.8 4.2 4.2 5.1 4.2  CL 98   < > 98 102 106 106 103  CO2 28   < > 28 30 27 24 31   GLUCOSE 122*   < > 127* 130* 131* 95 132*  BUN 51*   < > 57* 51* 60* 64* 72*  CREATININE 0.97   < > 0.99 0.84 0.89 0.85 0.96  CALCIUM 9.8   < > 10.7* 10.6* 10.9* 10.5* 10.4*  MG  --   --   --   --   --  2.7* 2.4  PHOS 2.7  --   --   --   --  2.3* 2.5   < > = values in this interval not displayed.    GFR: Estimated Creatinine Clearance: 45.7 mL/min (by C-G formula based on SCr of 0.96 mg/dL).  Liver Function Tests: Recent Labs  Lab 09/01/20 0915 09/05/20 1020 09/06/20 0450 09/07/20 0204  AST  --  101* 93* 64*  ALT  --  80* 76* 71*  ALKPHOS  --  164* 153* 153*  BILITOT  --  1.0 1.0 0.6  PROT  --  6.2* 5.7* 5.4*  ALBUMIN 3.1* 3.0* 2.7* 2.8*    CBG: Recent Labs  Lab 09/06/20 2313 09/07/20 0350 09/07/20 0752 09/07/20 1135 09/07/20 1516  GLUCAP 184* 127* 134* 145* 94     No results found for this or any previous visit (from the past 240 hour(s)).       Radiology Studies: DG CHEST PORT 1 VIEW  Result Date: 09/06/2020 CLINICAL DATA:  Heart failure EXAM: PORTABLE CHEST 1 VIEW COMPARISON:  09/04/2020 FINDINGS: Cardiac enlargement with vascular congestion. Perihilar infiltrates likely edema. Small pleural effusions. An enteric tube is present with tip off the field of view but below the left hemidiaphragm. Calcified aorta. IMPRESSION: Cardiac enlargement with pulmonary vascular congestion and perihilar edema. Small pleural effusions. Electronically Signed   By: Lucienne Capers M.D.   On: 09/06/2020 06:40        Scheduled Meds: . amantadine  100 mg Per Tube BID  . apixaban  2.5 mg Per  Tube BID  . chlorhexidine  15 mL Mouth Rinse BID  . Chlorhexidine Gluconate Cloth  6 each Topical Daily  . feeding supplement (PROSource TF)  45 mL Per Tube BID  . furosemide  40 mg Intravenous BID  . insulin aspart  0-9 Units Subcutaneous Q4H  . levETIRAcetam  1,000 mg Per Tube BID  . levothyroxine  75 mcg Per Tube Q0600  . mouth rinse  15 mL Mouth Rinse q12n4p  . metoprolol tartrate  100 mg Per Tube BID  . nutrition supplement (JUVEN)  1 packet  Per Tube BID BM  . PHENObarbital  60 mg Per Tube QHS   Continuous Infusions: . feeding supplement (OSMOLITE 1.2 CAL) 1,000 mL (09/07/20 0307)     LOS: 19 days    Time spent: over 30 min    Fayrene Helper, MD [facility]   To contact the attending provider between 7A-7P or the covering provider during after hours 7P-7A, please log into the web site [email or web address] and access using universal [place] password for that web site. If you do not have the password, please call the hospital operator.  09/07/2020, 6:08 PM

## 2020-09-07 NOTE — Progress Notes (Signed)
Occupational Therapy Treatment Patient Details Name: Lori Bridges MRN: [id number] DOB: 09-09-1930 Today's Date: 09/07/2020    History of present illness Lori Bridges is a 86 y.o. female with medical history significant of paroxysmal A. fib, chronic diastolic CHF, hypertension, hyperlipidemia, GERD, recent ICH, degenerative disc disease, CKD stage IIIa presents to emergency department with right-sided seizure-like activity. Pt just returned home on 8/19 from CIR s/p stroke affecting right side.   OT comments  Pt. Seen for skilled OT.  Dtr. Present and actively engaged in learning how to perform HEP for her mother.  Demo provided for B UE ROM.  Additional education on UE positioning to aide in edema management.    Follow Up Recommendations  SNF;Supervision/Assistance - 24 hour    Equipment Recommendations  None recommended by OT    Recommendations for Other Services Rehab consult    Precautions / Restrictions Precautions Precautions: Fall Precaution Comments: edematous limbs.       Mobility Bed Mobility                  Transfers                      Balance                                           ADL either performed or assessed with clinical judgement   ADL                                               Vision       Perception     Praxis      Cognition Arousal/Alertness: Lethargic Behavior During Therapy: Flat affect   Area of Impairment: Following commands                       Following Commands: Follows one step commands inconsistently;Follows one step commands with increased time     Problem Solving: Decreased initiation;Difficulty sequencing;Slow processing;Requires verbal cues;Requires tactile cues General Comments: patient able to open eyes but unable to sustain, following simple 1 step commands but inconsistently         Exercises General Exercises - Upper Extremity Shoulder  Flexion: PROM;Both;5 reps;Supine Shoulder Extension: PROM;Both;5 reps;Supine Elbow Flexion: PROM;5 reps;Both;Supine Elbow Extension: PROM;Both;5 reps;Supine Wrist Flexion: PROM;Both;5 reps;Supine Wrist Extension: PROM;Both;5 reps;Supine Other Exercises Other Exercises: pts. dtr. present. educated on how to assist/perform BUE rom.  also educated on positioning to aide in edema management   Shoulder Instructions       General Comments      Pertinent Vitals/ Pain       Pain Assessment: Faces Faces Pain Scale: No hurt  Home Living                                          Prior Functioning/Environment              Frequency  Min 2X/week        Progress Toward Goals  OT Goals(current goals can now be found in the care plan section)  Progress towards OT goals: Progressing toward goals  Plan Discharge plan remains appropriate    Co-evaluation                 AM-PAC OT "6 Clicks" Daily Activity     Outcome Measure   Help from another person eating meals?: Total Help from another person taking care of personal grooming?: Total Help from another person toileting, which includes using toliet, bedpan, or urinal?: Total Help from another person bathing (including washing, rinsing, drying)?: Total Help from another person to put on and taking off regular upper body clothing?: Total Help from another person to put on and taking off regular lower body clothing?: Total 6 Click Score: 6    End of Session    OT Visit Diagnosis: Other abnormalities of gait and mobility (R26.89);Muscle weakness (generalized) (M62.81);Low vision, both eyes (H54.2);Other symptoms and signs involving cognitive function;Hemiplegia and hemiparesis   Activity Tolerance Patient limited by lethargy   Patient Left in bed;with call bell/phone within reach;with family/visitor present   Nurse Communication          Time: 1240-1310 OT Time Calculation (min): 30  min  Charges: OT General Charges $OT Visit: 1 Visit OT Treatments $Therapeutic Exercise: 23-37 mins  Sonia Baller, COTA/L Acute Rehabilitation [phone]   Janice Coffin 09/07/2020, 1:35 PM

## 2020-09-08 DIAGNOSIS — R627 Adult failure to thrive: Secondary | ICD-10-CM

## 2020-09-08 LAB — CBC WITH DIFFERENTIAL/PLATELET
Abs Immature Granulocytes: 0.08 10*3/uL — ABNORMAL HIGH (ref 0.00–0.07)
Basophils Absolute: 0 10*3/uL (ref 0.0–0.1)
Basophils Relative: 0 %
Eosinophils Absolute: 0 10*3/uL (ref 0.0–0.5)
Eosinophils Relative: 0 %
HCT: 41.9 % (ref 36.0–46.0)
Hemoglobin: 13 g/dL (ref 12.0–15.0)
Immature Granulocytes: 1 %
Lymphocytes Relative: 12 %
Lymphs Abs: 0.9 10*3/uL (ref 0.7–4.0)
MCH: 29.9 pg (ref 26.0–34.0)
MCHC: 31 g/dL (ref 30.0–36.0)
MCV: 96.3 fL (ref 80.0–100.0)
Monocytes Absolute: 0.7 10*3/uL (ref 0.1–1.0)
Monocytes Relative: 9 %
Neutro Abs: 5.7 10*3/uL (ref 1.7–7.7)
Neutrophils Relative %: 78 %
Platelets: 292 10*3/uL (ref 150–400)
RBC: 4.35 MIL/uL (ref 3.87–5.11)
RDW: 15.3 % (ref 11.5–15.5)
WBC: 7.4 10*3/uL (ref 4.0–10.5)
nRBC: 0 % (ref 0.0–0.2)

## 2020-09-08 LAB — COMPREHENSIVE METABOLIC PANEL
ALT: 57 U/L — ABNORMAL HIGH (ref 0–44)
AST: 41 U/L (ref 15–41)
Albumin: 2.8 g/dL — ABNORMAL LOW (ref 3.5–5.0)
Alkaline Phosphatase: 140 U/L — ABNORMAL HIGH (ref 38–126)
Anion gap: 7 (ref 5–15)
BUN: 75 mg/dL — ABNORMAL HIGH (ref 8–23)
CO2: 34 mmol/L — ABNORMAL HIGH (ref 22–32)
Calcium: 10.3 mg/dL (ref 8.9–10.3)
Chloride: 103 mmol/L (ref 98–111)
Creatinine, Ser: 1.07 mg/dL — ABNORMAL HIGH (ref 0.44–1.00)
GFR calc Af Amer: 54 mL/min — ABNORMAL LOW (ref >60)
GFR calc non Af Amer: 46 mL/min — ABNORMAL LOW (ref >60)
Glucose, Bld: 93 mg/dL (ref 70–99)
Potassium: 4.1 mmol/L (ref 3.5–5.1)
Sodium: 144 mmol/L (ref 135–145)
Total Bilirubin: 0.7 mg/dL (ref 0.3–1.2)
Total Protein: 5.6 g/dL — ABNORMAL LOW (ref 6.5–8.1)

## 2020-09-08 LAB — PHOSPHORUS: Phosphorus: 2.6 mg/dL (ref 2.5–4.6)

## 2020-09-08 LAB — MAGNESIUM: Magnesium: 2.5 mg/dL — ABNORMAL HIGH (ref 1.7–2.4)

## 2020-09-08 LAB — GLUCOSE, CAPILLARY
Glucose-Capillary: 110 mg/dL — ABNORMAL HIGH (ref 70–99)
Glucose-Capillary: 112 mg/dL — ABNORMAL HIGH (ref 70–99)
Glucose-Capillary: 115 mg/dL — ABNORMAL HIGH (ref 70–99)
Glucose-Capillary: 118 mg/dL — ABNORMAL HIGH (ref 70–99)
Glucose-Capillary: 127 mg/dL — ABNORMAL HIGH (ref 70–99)
Glucose-Capillary: 84 mg/dL (ref 70–99)
Glucose-Capillary: 98 mg/dL (ref 70–99)

## 2020-09-08 NOTE — Plan of Care (Signed)
  Problem: Medication: Goal: Risk for medication side effects will decrease Outcome: Progressing   Problem: Safety: Goal: Verbalization of understanding the information provided will improve Outcome: Progressing   Problem: Clinical Measurements: Goal: Will remain free from infection Outcome: Progressing

## 2020-09-08 NOTE — Progress Notes (Signed)
PROGRESS NOTE    Lori Bridges  WSF:[id number] DOB: [birth year] DOA: 08/18/2020 PCP: Derinda Late, MD    Brief Narrative:  86 year old female past medical history of uncontrolled hypertension with recent cerebral hemorrhage requiring hospitalization, rehabilitation and then discharged home who was brought back to the hospital due to decreased level consciousness as well as right arm shaking. Concern was for acute status epilepticus. Neurology consulted and patient started on IV Keppra, IV Vimpat and phenobarbital. Initially placed in ICU in case she had to be intubated for acute airway protection, but this was not needed. Since then, patient has been able to be transferred out of ICU. Recent MRI noted acute CVA. Patient has remained sedated, felt to be in part due to her medications. Neurology decreased Vimpat on 9/3 in hopes that patient will become more alert however she did not. Neurology stopped her Vimpat altogether on morning of 9/6. Patient looked to be slightly more awake, but then started having jerking arm movements again.  At this point, neurology and palliative care have extensive discussion with patient's daughter. Patient has been changed to DNR.  Assessment & Plan:   Principal Problem:   Seizure ([place]) Active Problems:   Hyperlipidemia   Chronic diastolic CHF (congestive heart failure) (HCC)   ICH (intracerebral hemorrhage) (HCC) - 2 small L brain ICH, likely d/t HTN on subtherapeutic warfarin   CKD (chronic kidney disease), stage IIIa   Hypothyroidism   PAF (paroxysmal atrial fibrillation) (HCC)   Goals of care, counseling/discussion   Gastroesophageal reflux disease   Seizure disorder ([place])   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[place])   Pressure injury of skin   Palliative care by specialist   Altered mental status   DNR (do not resuscitate)   Adult failure to thrive   Poor fluid intake  Goals of Care:  Overall, Mrs. Reveron's prognosis is  poor.  further discussions needed with family regarding overall goals of care. Palliative Care to assist with confirming goal of pt's care  Seizure disorder with active seizure (HCC)/status epilepticus due to complex partial seizure:  Neurology c/s, appreciate recommendations - now signed off Pt obtunded on high dose AED's, continue keppra 1000 mg BID and phenobarb 60 mg daily as well as amantadine 100 mg BID for neurostimulation.  No further AED's as pt obtunded on high doses.  Ativan available prn. Goal is to minimize seizures and allow pt to wake enough to be able to take PO meals S/p solumedrol x3 days PO diet with aspiration precautions Continue to monitor Follow with neurology outpatient when discharged  Acute on chronic diastolic CHF (congestive heart failure) ([place]):  Echo from 05/2020 with EF 01-74%, diastolic function could not be evaluated At least 2+ bilateral LE edema Continue lasix BID CXR - pulmonary vascular congestion and perihilar edema, small effusions Continue with strict I/O, daily weights  ICH (intracerebral hemorrhage) (HCC)  Acute Subcentimeter R Cerebellar Infarct - 2 small L brain ICH, likely d/t HTN on subtherapeutic warfarin (now on eliquis, ok per neuro).  Also with acute subcentimeter R cerebellar infarct.  No additional w/u noted per neurology.  CKD (chronic kidney disease), stage IIIa: Stable. At baseline.  Hypothyroidism: Continue Synthroid as tolerated  Altered mental status/acute encephalopathy: Felt to be secondary to seizures and antiseizure medication. Remains very drowsy this AM  Atrial fibrillation with rapid ventricular rate, permanent: Previously on Coumadin, but has been discontinued since development of intraparenchymal hemorrhage during last hospitalization.  She's now on low dose eliquis per  neurology.Follow on metoprolol.    Acute CVA: MRI noted acute subcentimeter right cerebellar infarct: Neurology following.  No additional w/u  per neurology noted  Obesity: Meets criteria BMI greater than 30   DVT prophylaxis: Eliquis Code Status: DNR Family Communication: pt in room, family not at bedside  Status is: Inpatient  Remains inpatient appropriate because:Unsafe d/c plan   Dispo:  Patient From:    Planned Disposition: Residential Hospice  Expected discharge date: 09/14/20  Medically stable for discharge:          Consultants:   Neurology  Palliative Care  Procedures:  EEG 8/25  Antimicrobials: Anti-infectives (From admission, onward)   None       Subjective: Difficult to assess this AM, pt drowsy, not very communicative  Objective: Vitals:   09/08/20 0500 09/08/20 0724 09/08/20 1622 09/08/20 1637  BP:  109/66 113/78 110/80  Pulse:  (!) 110 (!) 102 (!) 109  Resp:  17 (!) 21 20  Temp:  (!) 97.5 F (36.4 C) 97.9 F (36.6 C) 97.9 F (36.6 C)  TempSrc:  Axillary Oral   SpO2:  98% 100% 98%  Weight: 85.4 kg     Height:        Intake/Output Summary (Last 24 hours) at 09/08/2020 1723 Last data filed at 09/08/2020 1408 Gross per 24 hour  Intake --  Output 2125 ml  Net -2125 ml   Filed Weights   09/06/20 0435 09/07/20 0323 09/08/20 0500  Weight: 85.6 kg 86.1 kg 85.4 kg    Examination:  General exam: Appears calm and comfortable  Respiratory system: Clear to auscultation. Respiratory effort normal. Cardiovascular system: S1 & S2 heard, Regular Gastrointestinal system: Abdomen is nondistended, soft and nontender. No organomegaly or masses felt. Normal bowel sounds heard. Central nervous system: asleep, arousable No focal neurological deficits. Extremities: Symmetric 5 x 5 power. Skin: No rashes, lesions  Psychiatry: difficult to assess given mentation  Data Reviewed: I have personally reviewed following labs and imaging studies  CBC: Recent Labs  Lab 09/04/20 0753 09/05/20 1020 09/06/20 0450 09/07/20 1443 09/08/20 0344  WBC 10.3 8.2 6.8 7.8 7.4  NEUTROABS  --  6.6  5.7 6.3 5.7  HGB 11.9* 11.9* 12.0 13.3 13.0  HCT 38.2 39.0 39.1 42.0 41.9  MCV 96.7 96.8 96.5 95.7 96.3  PLT 233 235 PLATELET CLUMPS NOTED ON SMEAR, UNABLE TO ESTIMATE 274 614   Basic Metabolic Panel: Recent Labs  Lab 09/04/20 0753 09/05/20 1020 09/06/20 0450 09/07/20 0204 09/08/20 0344  NA 141 142 141 142 144  K 4.2 4.2 5.1 4.2 4.1  CL 102 106 106 103 103  CO2 30 27 24 31  34*  GLUCOSE 130* 131* 95 132* 93  BUN 51* 60* 64* 72* 75*  CREATININE 0.84 0.89 0.85 0.96 1.07*  CALCIUM 10.6* 10.9* 10.5* 10.4* 10.3  MG  --   --  2.7* 2.4 2.5*  PHOS  --   --  2.3* 2.5 2.6   GFR: Estimated Creatinine Clearance: 40.8 mL/min (A) (by C-G formula based on SCr of 1.07 mg/dL (H)). Liver Function Tests: Recent Labs  Lab 09/05/20 1020 09/06/20 0450 09/07/20 0204 09/08/20 0344  AST 101* 93* 64* 41  ALT 80* 76* 71* 57*  ALKPHOS 164* 153* 153* 140*  BILITOT 1.0 1.0 0.6 0.7  PROT 6.2* 5.7* 5.4* 5.6*  ALBUMIN 3.0* 2.7* 2.8* 2.8*   No results for input(s): LIPASE, AMYLASE in the last 168 hours. No results for input(s): AMMONIA in the last 168 hours. Coagulation  Profile: No results for input(s): INR, PROTIME in the last 168 hours. Cardiac Enzymes: No results for input(s): CKTOTAL, CKMB, CKMBINDEX, TROPONINI in the last 168 hours. BNP (last 3 results) No results for input(s): PROBNP in the last 8760 hours. HbA1C: No results for input(s): HGBA1C in the last 72 hours. CBG: Recent Labs  Lab 09/08/20 0006 09/08/20 0506 09/08/20 0723 09/08/20 1207 09/08/20 1643  GLUCAP 112* 84 98 118* 115*   Lipid Profile: No results for input(s): CHOL, HDL, LDLCALC, TRIG, CHOLHDL, LDLDIRECT in the last 72 hours. Thyroid Function Tests: No results for input(s): TSH, T4TOTAL, FREET4, T3FREE, THYROIDAB in the last 72 hours. Anemia Panel: No results for input(s): VITAMINB12, FOLATE, FERRITIN, TIBC, IRON, RETICCTPCT in the last 72 hours. Sepsis Labs: Recent Labs  Lab 09/03/20 1624  PROCALCITON 0.24    LATICACIDVEN 1.0    No results found for this or any previous visit (from the past 240 hour(s)).   Radiology Studies: No results found.  Scheduled Meds: . amantadine  100 mg Per Tube BID  . apixaban  2.5 mg Per Tube BID  . chlorhexidine  15 mL Mouth Rinse BID  . Chlorhexidine Gluconate Cloth  6 each Topical Daily  . feeding supplement (PROSource TF)  45 mL Per Tube BID  . furosemide  40 mg Intravenous BID  . insulin aspart  0-9 Units Subcutaneous Q4H  . levETIRAcetam  1,000 mg Per Tube BID  . levothyroxine  75 mcg Per Tube Q0600  . mouth rinse  15 mL Mouth Rinse q12n4p  . metoprolol tartrate  100 mg Per Tube BID  . nutrition supplement (JUVEN)  1 packet Per Tube BID BM  . PHENObarbital  60 mg Per Tube QHS  . sodium chloride flush  10-40 mL Intracatheter Q12H   Continuous Infusions: . feeding supplement (OSMOLITE 1.2 CAL) 1,000 mL (09/08/20 0641)     LOS: 20 days   Marylu Lund, MD [facility] Pager On Amion  If 7PM-7AM, please contact night-coverage 09/08/2020, 5:23 PM

## 2020-09-09 LAB — GLUCOSE, CAPILLARY
Glucose-Capillary: 117 mg/dL — ABNORMAL HIGH (ref 70–99)
Glucose-Capillary: 133 mg/dL — ABNORMAL HIGH (ref 70–99)
Glucose-Capillary: 146 mg/dL — ABNORMAL HIGH (ref 70–99)
Glucose-Capillary: 114 mg/dL — ABNORMAL HIGH (ref 70–99)
Glucose-Capillary: 128 mg/dL — ABNORMAL HIGH (ref 70–99)
Glucose-Capillary: 152 mg/dL — ABNORMAL HIGH (ref 70–99)

## 2020-09-09 NOTE — Progress Notes (Signed)
PROGRESS NOTE    Lori Bridges  TKZ:[id number] DOB: Jul 18, 1930 DOA: 08/18/2020 PCP: Derinda Late, MD    Brief Narrative:  86 year old female past medical history of uncontrolled hypertension with recent cerebral hemorrhage requiring hospitalization, rehabilitation and then discharged home who was brought back to the hospital due to decreased level consciousness as well as right arm shaking. Concern was for acute status epilepticus. Neurology consulted and patient started on IV Keppra, IV Vimpat and phenobarbital. Initially placed in ICU in case she had to be intubated for acute airway protection, but this was not needed. Since then, patient has been able to be transferred out of ICU. Recent MRI noted acute CVA. Patient has remained sedated, felt to be in part due to her medications. Neurology decreased Vimpat on 9/3 in hopes that patient will become more alert however she did not. Neurology stopped her Vimpat altogether on morning of 9/6. Patient looked to be slightly more awake, but then started having jerking arm movements again.  At this point, neurology and palliative care have extensive discussion with patient's daughter. Patient has been changed to DNR.  Assessment & Plan:   Principal Problem:   Seizure ([place]) Active Problems:   Hyperlipidemia   Chronic diastolic CHF (congestive heart failure) (HCC)   ICH (intracerebral hemorrhage) (HCC) - 2 small L brain ICH, likely d/t HTN on subtherapeutic warfarin   CKD (chronic kidney disease), stage IIIa   Hypothyroidism   PAF (paroxysmal atrial fibrillation) (HCC)   Goals of care, counseling/discussion   Gastroesophageal reflux disease   Seizure disorder ([place])   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[place])   Pressure injury of skin   Palliative care by specialist   Altered mental status   DNR (do not resuscitate)   Adult failure to thrive   Poor fluid intake  Goals of Care:  Overall, Lori Bridges's overall  prognosis is poor.  further discussions needed with family regarding overall goals of care. Palliative Care noted to assist with confirming goal of pt's care  Seizure disorder with active seizure (HCC)/status epilepticus due to complex partial seizure:  Neurology c/s, appreciate recommendations - now signed off Pt obtunded on high dose AED's, continue keppra 1000 mg BID and phenobarb 60 mg daily as well as amantadine 100 mg BID for neurostimulation.  No further AED's as pt obtunded on high doses.  Ativan available prn. Goal is to minimize seizures and allow pt to wake enough to be able to take PO meals S/p solumedrol x3 days PO diet with aspiration precautions Continue to monitor Recommend follow with neurology outpatient when discharged  Acute on chronic diastolic CHF (congestive heart failure) ([place]):  Echo from 05/2020 with EF 57-32%, diastolic function could not be evaluated At least 2+ bilateral LE edema Continue lasix BID CXR - pulmonary vascular congestion and perihilar edema, small effusions Continue with strict I/O, daily weights  ICH (intracerebral hemorrhage) (HCC)  Acute Subcentimeter R Cerebellar Infarct - 2 small L brain ICH, likely d/t HTN on subtherapeutic warfarin (now on eliquis, ok per neuro).  Also with acute subcentimeter R cerebellar infarct.  No additional w/u noted per neurology.  CKD (chronic kidney disease), stage IIIa: Stable. At baseline.  Hypothyroidism: Continue Synthroid as tolerated  Altered mental status/acute encephalopathy: Felt to be secondary to seizures and antiseizure medication. Remains very drowsy this AM  Atrial fibrillation with rapid ventricular rate, permanent: Previously on Coumadin, but has been discontinued since development of intraparenchymal hemorrhage during last hospitalization.  She's now on low  dose eliquis per neurology.Follow on metoprolol.    Acute CVA: MRI noted acute subcentimeter right cerebellar infarct: Neurology  following.  No additional w/u per neurology noted  Obesity: Meets criteria BMI greater than 30   DVT prophylaxis: Eliquis Code Status: DNR Family Communication: pt in room, family currently at bedsie  Status is: Inpatient  Remains inpatient appropriate because:Unsafe d/c plan   Dispo:  Patient From:    Planned Disposition: Residential Hospice  Expected discharge date: 09/14/20  Medically stable for discharge:    Consultants:   Neurology  Palliative Care  Procedures:  EEG 8/25  Antimicrobials: Anti-infectives (From admission, onward)   None      Subjective: Unable to fully assess given mentation  Objective: Vitals:   09/09/20 0752 09/09/20 1130 09/09/20 1200 09/09/20 1547  BP: 118/83 117/67  108/71  Pulse: 100 100    Resp: 18 20    Temp: 98 F (36.7 C) 98.4 F (36.9 C) 99 F (37.2 C) 99 F (37.2 C)  TempSrc: Oral Oral Oral Oral  SpO2: 98% 98%    Weight:      Height:        Intake/Output Summary (Last 24 hours) at 09/09/2020 1702 Last data filed at 09/09/2020 9163 Gross per 24 hour  Intake 10 ml  Output 2775 ml  Net -2765 ml   Filed Weights   09/07/20 0323 09/08/20 0500 09/09/20 0500  Weight: 86.1 kg 85.4 kg 83.4 kg    Examination: General exam: Asleep, laying in bed, in nad Respiratory system: Normal respiratory effort, no wheezing Cardiovascular system: regular rate, s1, s2 Gastrointestinal system: Soft, nondistended, positive BS Central nervous system: CN2-12 grossly intact, strength intact Extremities: Perfused, no clubbing Skin: Normal skin turgor, no notable skin lesions seen Psychiatry: Unable to assess given current mentation   Data Reviewed: I have personally reviewed following labs and imaging studies  CBC: Recent Labs  Lab 09/04/20 0753 09/05/20 1020 09/06/20 0450 09/07/20 1443 09/08/20 0344  WBC 10.3 8.2 6.8 7.8 7.4  NEUTROABS  --  6.6 5.7 6.3 5.7  HGB 11.9* 11.9* 12.0 13.3 13.0  HCT 38.2 39.0 39.1 42.0 41.9  MCV  96.7 96.8 96.5 95.7 96.3  PLT 233 235 PLATELET CLUMPS NOTED ON SMEAR, UNABLE TO ESTIMATE 274 846   Basic Metabolic Panel: Recent Labs  Lab 09/04/20 0753 09/05/20 1020 09/06/20 0450 09/07/20 0204 09/08/20 0344  NA 141 142 141 142 144  K 4.2 4.2 5.1 4.2 4.1  CL 102 106 106 103 103  CO2 30 27 24 31  34*  GLUCOSE 130* 131* 95 132* 93  BUN 51* 60* 64* 72* 75*  CREATININE 0.84 0.89 0.85 0.96 1.07*  CALCIUM 10.6* 10.9* 10.5* 10.4* 10.3  MG  --   --  2.7* 2.4 2.5*  PHOS  --   --  2.3* 2.5 2.6   GFR: Estimated Creatinine Clearance: 40.3 mL/min (A) (by C-G formula based on SCr of 1.07 mg/dL (H)). Liver Function Tests: Recent Labs  Lab 09/05/20 1020 09/06/20 0450 09/07/20 0204 09/08/20 0344  AST 101* 93* 64* 41  ALT 80* 76* 71* 57*  ALKPHOS 164* 153* 153* 140*  BILITOT 1.0 1.0 0.6 0.7  PROT 6.2* 5.7* 5.4* 5.6*  ALBUMIN 3.0* 2.7* 2.8* 2.8*   No results for input(s): LIPASE, AMYLASE in the last 168 hours. No results for input(s): AMMONIA in the last 168 hours. Coagulation Profile: No results for input(s): INR, PROTIME in the last 168 hours. Cardiac Enzymes: No results for input(s): CKTOTAL, CKMB,  CKMBINDEX, TROPONINI in the last 168 hours. BNP (last 3 results) No results for input(s): PROBNP in the last 8760 hours. HbA1C: No results for input(s): HGBA1C in the last 72 hours. CBG: Recent Labs  Lab 09/08/20 2331 09/09/20 0256 09/09/20 0826 09/09/20 1128 09/09/20 1520  GLUCAP 110* 117* 133* 146* 114*   Lipid Profile: No results for input(s): CHOL, HDL, LDLCALC, TRIG, CHOLHDL, LDLDIRECT in the last 72 hours. Thyroid Function Tests: No results for input(s): TSH, T4TOTAL, FREET4, T3FREE, THYROIDAB in the last 72 hours. Anemia Panel: No results for input(s): VITAMINB12, FOLATE, FERRITIN, TIBC, IRON, RETICCTPCT in the last 72 hours. Sepsis Labs: Recent Labs  Lab 09/03/20 1624  PROCALCITON 0.24  LATICACIDVEN 1.0    No results found for this or any previous visit (from  the past 240 hour(s)).   Radiology Studies: No results found.  Scheduled Meds: . amantadine  100 mg Per Tube BID  . apixaban  2.5 mg Per Tube BID  . chlorhexidine  15 mL Mouth Rinse BID  . Chlorhexidine Gluconate Cloth  6 each Topical Daily  . feeding supplement (PROSource TF)  45 mL Per Tube BID  . furosemide  40 mg Intravenous BID  . insulin aspart  0-9 Units Subcutaneous Q4H  . levETIRAcetam  1,000 mg Per Tube BID  . levothyroxine  75 mcg Per Tube Q0600  . mouth rinse  15 mL Mouth Rinse q12n4p  . metoprolol tartrate  100 mg Per Tube BID  . nutrition supplement (JUVEN)  1 packet Per Tube BID BM  . PHENObarbital  60 mg Per Tube QHS  . sodium chloride flush  10-40 mL Intracatheter Q12H   Continuous Infusions: . feeding supplement (OSMOLITE 1.2 CAL) 1,000 mL (09/08/20 0641)     LOS: 21 days   Marylu Lund, MD [facility] Pager On Amion  If 7PM-7AM, please contact night-coverage 09/09/2020, 5:02 PM

## 2020-09-10 LAB — GLUCOSE, CAPILLARY
Glucose-Capillary: 89 mg/dL (ref 70–99)
Glucose-Capillary: 100 mg/dL — ABNORMAL HIGH (ref 70–99)
Glucose-Capillary: 113 mg/dL — ABNORMAL HIGH (ref 70–99)
Glucose-Capillary: 99 mg/dL (ref 70–99)
Glucose-Capillary: 117 mg/dL — ABNORMAL HIGH (ref 70–99)
Glucose-Capillary: 104 mg/dL — ABNORMAL HIGH (ref 70–99)

## 2020-09-10 MED ORDER — LEVETIRACETAM 100 MG/ML PO SOLN
1000.0000 mg | Freq: Two times a day (BID) | ORAL | Status: DC
  Administered 2020-09-10: 1000 mg
  Filled 2020-09-10: qty 10

## 2020-09-10 NOTE — Progress Notes (Signed)
Patient ID: CREE NAPOLI, female   DOB: 06-15-1930, 86 y.o.   MRN: [id number]  This NP reviewed medical records, received report from team and then assessed patient at the bedside as a follow up to for palliative medicine needs and emotional support.  Patient remains lethargic and minimally responsive. She is failing to thrive.  Patient remains on core track for nutritional support, taking very little POs.  Family member at bedside/ I believe is a niece    She is asking about allowing a family member to stay with the paient though the night 2/2 to patient inability to communicate needs.  Discussed with charge nurse and cleared.   Family face ongoing treatment option decisions, advanced directive decisions, and anticipatory care needs.  Placed call to daughter [name] to answer questions and address concerns.  I suggested a family meeting with treatment team to discuss current medical situation and explore treatment options and care needs.  Daughter tells me very clearly that family is not in need of a meeting.  They are "not interested in hospice, do not want a permanent feeding tube"  We did discuss that a core-tac has a limited time frame for safe usage.   Therapeutic listening and emotional support.  PMT will be available for family as they request.    Discussed with Dr Wyline Copas in detail  Total time spent on the unit was 35 minutes  Greater than 50% of the time was spent in counseling and coordination of care   [name] NP  Palliative Medicine Team Team Phone # [phone] Pager [phone]

## 2020-09-10 NOTE — Procedures (Signed)
Cortrak  Person Inserting Tube:  Jacklynn Barnacle E, RD Tube Type:  Cortrak - 43 inches Tube Location:  Left nare Initial Placement:  Stomach Secured by: Bridle Technique Used to Measure Tube Placement:  Documented cm marking at nare/ corner of mouth Cortrak Secured At:  68 cm    Cortrak Tube Team Note:  Consult received to place a Cortrak feeding tube.    Cortrak team received page as previous tube had become clogged. RN and Cortrak team attempted to dislodge clog but unsuccessful. RN messaged MD via secure chat and received order to replace Cortrak feeding tube. Feeding tube successfully replaced.  No x-ray is required. RN may begin using tube.   If the tube becomes dislodged please keep the tube and contact the Cortrak team at [email or web address] (password [email or web address]) for replacement.  If after hours and replacement cannot be delayed, place a NG tube and confirm placement with an abdominal x-ray.   Jacklynn Barnacle, MS, RD, LDN Pager number available on Amion

## 2020-09-10 NOTE — Progress Notes (Signed)
Physical Therapy Treatment Patient Details Name: Lori Bridges MRN: [id number] DOB: [birth year] Today's Date: 09/10/2020    History of Present Illness Lori Bridges is a 86 y.o. female with medical history significant of paroxysmal A. fib, chronic diastolic CHF, hypertension, hyperlipidemia, GERD, recent ICH, degenerative disc disease, CKD stage IIIa presents to emergency department with right-sided seizure-like activity. Pt just returned home on 8/19 from CIR s/p stroke affecting right side.    PT Comments    Pt awake and alert in bed this session. Did not follow commands. Was lying against right side rail. When repositioned pt responded that she wanted to go back against the rail. Use of pillows to position pt in midline with bed placed in modified chair position. Acute PT to continue during pt's hospital stay.     Follow Up Recommendations  SNF     Equipment Recommendations  None recommended by PT;Other (comment) (to be determined at next venue)    Recommendations for Other Services Rehab consult     Precautions / Restrictions Precautions Precautions: Fall Precaution Comments: edematous limbs. Restrictions Weight Bearing Restrictions: No    Cognition Arousal/Alertness: Awake/alert Behavior During Therapy: Flat affect Overall Cognitive Status: Impaired/Different from baseline Area of Impairment: Following commands;Orientation;Problem solving;Attention;Safety/judgement;Awareness                 Orientation Level: Disoriented to;Place;Time;Situation   Memory: Decreased recall of precautions;Decreased short-term memory Following Commands: Follows one step commands inconsistently;Follows one step commands with increased time Safety/Judgement: Decreased awareness of safety;Decreased awareness of deficits Awareness: Intellectual Problem Solving: Decreased initiation;Difficulty sequencing;Slow processing;Requires verbal cues;Requires tactile cues General Comments: pt  awake and alert in bed. needs facilitation to initiate/perfrom ex's. presenting with right side neglect vs inattention today.      Exercises General Exercises - Lower Extremity Ankle Circles/Pumps: AAROM;Strengthening;Both;10 reps;Supine;Limitations Ankle Circles/Pumps Limitations: PTA initiated with pt assisting once they were initiated Heel Slides: AAROM;Strengthening;Both;10 reps;Supine;Limitations Heel Slides Limitations: pt assisted once PTA initiated movements Hip ABduction/ADduction: AAROM;Strengthening;Both;10 reps;Supine;Limitations Hip Abduction/Adduction Limitations: pt assisted once PTA initated movements Straight Leg Raises: AAROM;Strengthening;Both;10 reps;Supine;Limitations Straight Leg Raises Limitations: pt assist minmally with this exercise on bil LE"s.     Pertinent Vitals/Pain Pain Assessment: 0-10 Pain Score: 0-No pain     PT Goals (current goals can now be found in the care plan section) Acute Rehab PT Goals Patient Stated Goal: family would like her home PT Goal Formulation: With family Time For Goal Achievement: 09/10/20 Potential to Achieve Goals: Good Progress towards PT goals: Progressing toward goals    Frequency    Min 2X/week      PT Plan Current plan remains appropriate    AM-PAC PT "6 Clicks" Mobility   Outcome Measure  Help needed turning from your back to your side while in a flat bed without using bedrails?: Total Help needed moving from lying on your back to sitting on the side of a flat bed without using bedrails?: Total Help needed moving to and from a bed to a chair (including a wheelchair)?: Total Help needed standing up from a chair using your arms (e.g., wheelchair or bedside chair)?: Total Help needed to walk in hospital room?: Total Help needed climbing 3-5 steps with a railing? : Total 6 Click Score: 6    End of Session   Activity Tolerance: Patient tolerated treatment well;Patient limited by fatigue Patient left: in  bed;with call bell/phone within reach;with bed alarm set;with family/visitor present Nurse Communication: Mobility status PT Visit Diagnosis: Muscle weakness (generalized) (M62.81)  Time: 5320-2334 PT Time Calculation (min) (ACUTE ONLY): 12 min  Charges:  $Therapeutic Exercise: 8-22 mins                     Willow Ora, PTA, [facility] Acute [employer] [phone] 09/10/20, 11:53 AM  Willow Ora 09/10/2020, 11:51 AM

## 2020-09-10 NOTE — Progress Notes (Signed)
PROGRESS NOTE    Lori Bridges  OIZ:[id number] DOB: 1930/09/14 DOA: 08/18/2020 PCP: Derinda Late, MD    Brief Narrative:  86 year old female past medical history of uncontrolled hypertension with recent cerebral hemorrhage requiring hospitalization, rehabilitation and then discharged home who was brought back to the hospital due to decreased level consciousness as well as right arm shaking. Concern was for acute status epilepticus. Neurology consulted and patient started on IV Keppra, IV Vimpat and phenobarbital. Initially placed in ICU in case she had to be intubated for acute airway protection, but this was not needed. Since then, patient has been able to be transferred out of ICU. Recent MRI noted acute CVA. Patient has remained sedated, felt to be in part due to her medications. Neurology decreased Vimpat on 9/3 in hopes that patient will become more alert however she did not. Neurology stopped her Vimpat altogether on morning of 9/6. Patient looked to be slightly more awake, but then started having jerking arm movements again.  At this point, neurology and palliative care have extensive discussion with patient's daughter. Patient has been changed to DNR.  Assessment & Plan:   Principal Problem:   Seizure ([place]) Active Problems:   Hyperlipidemia   Chronic diastolic CHF (congestive heart failure) (HCC)   ICH (intracerebral hemorrhage) (HCC) - 2 small L brain ICH, likely d/t HTN on subtherapeutic warfarin   CKD (chronic kidney disease), stage IIIa   Hypothyroidism   PAF (paroxysmal atrial fibrillation) (HCC)   Goals of care, counseling/discussion   Gastroesophageal reflux disease   Seizure disorder ([place])   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[place])   Pressure injury of skin   Palliative care by specialist   Altered mental status   DNR (do not resuscitate)   Adult failure to thrive   Poor fluid intake  Goals of Care:  Overall, [name]'s overall  prognosis is poor.  further discussions needed with family regarding overall goals of care. Palliative Care noted to assist with confirming goal of pt's care  Seizure disorder with active seizure (HCC)/status epilepticus due to complex partial seizure:  Neurology c/s, appreciate recommendations - now signed off Pt obtunded on high dose AED's, continue keppra 1000 mg BID and phenobarb 60 mg daily as well as amantadine 100 mg BID for neurostimulation.  No further AED's as pt obtunded on high doses.  Ativan available prn. Goal is to minimize seizures and allow pt to wake enough to be able to take PO meals S/p solumedrol x3 days PO diet with aspiration precautions Recommend follow with neurology outpatient when discharged  Acute on chronic diastolic CHF (congestive heart failure) ([place]):  Echo from 05/2020 with EF 33-82%, diastolic function could not be evaluated At least 2+ bilateral LE edema Continue lasix BID CXR - pulmonary vascular congestion and perihilar edema, small effusions Continue with strict I/O, daily weights  ICH (intracerebral hemorrhage) (HCC)  Acute Subcentimeter R Cerebellar Infarct - 2 small L brain ICH, likely d/t HTN on subtherapeutic warfarin (now on eliquis, ok per neuro).  Also with acute subcentimeter R cerebellar infarct.  No additional w/u noted per neurology.  CKD (chronic kidney disease), stage IIIa: Stable. At baseline.  Hypothyroidism: Continue Synthroid as tolerated  Altered mental status/acute encephalopathy: Felt to be secondary to seizures and antiseizure medication. Remains very drowsy today and mildly communicative  Atrial fibrillation with rapid ventricular rate, permanent: Previously on Coumadin, but has been discontinued since development of intraparenchymal hemorrhage during last hospitalization.  She's now on low dose  eliquis per neurology.Follow on metoprolol.    Acute CVA: MRI noted acute subcentimeter right cerebellar infarct: Neurology  following.  No additional w/u per neurology noted  Obesity: Meets criteria BMI greater than 30   DVT prophylaxis: Eliquis Code Status: DNR Family Communication: pt in room, family currently at bedsie  Status is: Inpatient  Remains inpatient appropriate because:Unsafe d/c plan   Dispo:  Patient From:    Planned Disposition: Residential Hospice  Expected discharge date: 09/14/20  Medically stable for discharge:    Consultants:   Neurology  Palliative Care  Procedures:  EEG 8/25  Antimicrobials: Anti-infectives (From admission, onward)   None      Subjective: Cannot fully assess given current mentation  Objective: Vitals:   09/10/20 0500 09/10/20 0740 09/10/20 1150 09/10/20 1553  BP:  114/77 111/77 105/68  Pulse:  (!) 129 (!) 109 (!) 109  Resp:  18 20 20   Temp:  97.7 F (36.5 C) 97.6 F (36.4 C) 98.7 F (37.1 C)  TempSrc:  Axillary Axillary Axillary  SpO2:  100% 98% 98%  Weight: 78.8 kg     Height:        Intake/Output Summary (Last 24 hours) at 09/10/2020 1657 Last data filed at 09/10/2020 1600 Gross per 24 hour  Intake 1377.92 ml  Output 2975 ml  Net -1597.08 ml   Filed Weights   09/08/20 0500 09/09/20 0500 09/10/20 0500  Weight: 85.4 kg 83.4 kg 78.8 kg    Examination: General exam: Awake, laying in bed, in nad Respiratory system: Normal respiratory effort, no wheezing Cardiovascular system: regular rate, s1, s2 Gastrointestinal system: Soft, nondistended, positive BS, coretrak in place Central nervous system: CN2-12 grossly intact, strength intact Extremities: Perfused, no clubbing Skin: Normal skin turgor, no notable skin lesions seen Psychiatry: Mood normal // no visual hallucinations   Data Reviewed: I have personally reviewed following labs and imaging studies  CBC: Recent Labs  Lab 09/04/20 0753 09/05/20 1020 09/06/20 0450 09/07/20 1443 09/08/20 0344  WBC 10.3 8.2 6.8 7.8 7.4  NEUTROABS  --  6.6 5.7 6.3 5.7  HGB 11.9* 11.9*  12.0 13.3 13.0  HCT 38.2 39.0 39.1 42.0 41.9  MCV 96.7 96.8 96.5 95.7 96.3  PLT 233 235 PLATELET CLUMPS NOTED ON SMEAR, UNABLE TO ESTIMATE 274 229   Basic Metabolic Panel: Recent Labs  Lab 09/04/20 0753 09/05/20 1020 09/06/20 0450 09/07/20 0204 09/08/20 0344  NA 141 142 141 142 144  K 4.2 4.2 5.1 4.2 4.1  CL 102 106 106 103 103  CO2 30 27 24 31  34*  GLUCOSE 130* 131* 95 132* 93  BUN 51* 60* 64* 72* 75*  CREATININE 0.84 0.89 0.85 0.96 1.07*  CALCIUM 10.6* 10.9* 10.5* 10.4* 10.3  MG  --   --  2.7* 2.4 2.5*  PHOS  --   --  2.3* 2.5 2.6   GFR: Estimated Creatinine Clearance: 39.3 mL/min (A) (by C-G formula based on SCr of 1.07 mg/dL (H)). Liver Function Tests: Recent Labs  Lab 09/05/20 1020 09/06/20 0450 09/07/20 0204 09/08/20 0344  AST 101* 93* 64* 41  ALT 80* 76* 71* 57*  ALKPHOS 164* 153* 153* 140*  BILITOT 1.0 1.0 0.6 0.7  PROT 6.2* 5.7* 5.4* 5.6*  ALBUMIN 3.0* 2.7* 2.8* 2.8*   No results for input(s): LIPASE, AMYLASE in the last 168 hours. No results for input(s): AMMONIA in the last 168 hours. Coagulation Profile: No results for input(s): INR, PROTIME in the last 168 hours. Cardiac Enzymes: No results for  input(s): CKTOTAL, CKMB, CKMBINDEX, TROPONINI in the last 168 hours. BNP (last 3 results) No results for input(s): PROBNP in the last 8760 hours. HbA1C: No results for input(s): HGBA1C in the last 72 hours. CBG: Recent Labs  Lab 09/09/20 2318 09/10/20 0438 09/10/20 0739 09/10/20 1148 09/10/20 1550  GLUCAP 152* 89 100* 113* 99   Lipid Profile: No results for input(s): CHOL, HDL, LDLCALC, TRIG, CHOLHDL, LDLDIRECT in the last 72 hours. Thyroid Function Tests: No results for input(s): TSH, T4TOTAL, FREET4, T3FREE, THYROIDAB in the last 72 hours. Anemia Panel: No results for input(s): VITAMINB12, FOLATE, FERRITIN, TIBC, IRON, RETICCTPCT in the last 72 hours. Sepsis Labs: No results for input(s): PROCALCITON, LATICACIDVEN in the last 168 hours.  No  results found for this or any previous visit (from the past 240 hour(s)).   Radiology Studies: No results found.  Scheduled Meds: . amantadine  100 mg Per Tube BID  . apixaban  2.5 mg Per Tube BID  . chlorhexidine  15 mL Mouth Rinse BID  . Chlorhexidine Gluconate Cloth  6 each Topical Daily  . feeding supplement (PROSource TF)  45 mL Per Tube BID  . furosemide  40 mg Intravenous BID  . insulin aspart  0-9 Units Subcutaneous Q4H  . levETIRAcetam  1,000 mg Per Tube BID  . levothyroxine  75 mcg Per Tube Q0600  . mouth rinse  15 mL Mouth Rinse q12n4p  . metoprolol tartrate  100 mg Per Tube BID  . nutrition supplement (JUVEN)  1 packet Per Tube BID BM  . PHENObarbital  60 mg Per Tube QHS  . sodium chloride flush  10-40 mL Intracatheter Q12H   Continuous Infusions: . feeding supplement (OSMOLITE 1.2 CAL) 1,000 mL (09/10/20 0035)     LOS: 22 days   Marylu Lund, MD [facility] Pager On Amion  If 7PM-7AM, please contact night-coverage 09/10/2020, 4:57 PM

## 2020-09-10 NOTE — Plan of Care (Signed)
Pt has been slow to progress with PT. Primary PT to set updated goals.   Willow Ora, PTA, [place] [phone] 09/10/20, 11:55 AM

## 2020-09-10 NOTE — Plan of Care (Signed)
Pt alert and confused. Family at bedside. Wound to coccyx with foam in place. Inner thighs ecchymosis. Foley in place. Midline to LUA. Generalized edema +2.   Problem: Education: Goal: Expressions of having a comfortable level of knowledge regarding the disease process will increase Outcome: Progressing   Problem: Coping: Goal: Ability to adjust to condition or change in health will improve Outcome: Progressing Goal: Ability to identify appropriate support needs will improve Outcome: Progressing   Problem: Health Behavior/Discharge Planning: Goal: Compliance with prescribed medication regimen will improve Outcome: Progressing   Problem: Medication: Goal: Risk for medication side effects will decrease Outcome: Progressing   Problem: Clinical Measurements: Goal: Complications related to the disease process, condition or treatment will be avoided or minimized Outcome: Progressing Goal: Diagnostic test results will improve Outcome: Progressing   Problem: Safety: Goal: Verbalization of understanding the information provided will improve Outcome: Progressing   Problem: Self-Concept: Goal: Level of anxiety will decrease Outcome: Progressing Goal: Ability to verbalize feelings about condition will improve Outcome: Progressing   Problem: Education: Goal: Knowledge of General Education information will improve Description: Including pain rating scale, medication(s)/side effects and non-pharmacologic comfort measures Outcome: Progressing   Problem: Health Behavior/Discharge Planning: Goal: Ability to manage health-related needs will improve Outcome: Progressing   Problem: Clinical Measurements: Goal: Ability to maintain clinical measurements within normal limits will improve Outcome: Progressing Goal: Will remain free from infection Outcome: Progressing Goal: Diagnostic test results will improve Outcome: Progressing Goal: Respiratory complications will improve Outcome:  Progressing Goal: Cardiovascular complication will be avoided Outcome: Progressing   Problem: Activity: Goal: Risk for activity intolerance will decrease Outcome: Progressing   Problem: Nutrition: Goal: Adequate nutrition will be maintained Outcome: Progressing   Problem: Coping: Goal: Level of anxiety will decrease Outcome: Progressing   Problem: Elimination: Goal: Will not experience complications related to bowel motility Outcome: Progressing Goal: Will not experience complications related to urinary retention Outcome: Progressing   Problem: Pain Managment: Goal: General experience of comfort will improve Outcome: Progressing   Problem: Safety: Goal: Ability to remain free from injury will improve Outcome: Progressing   Problem: Skin Integrity: Goal: Risk for impaired skin integrity will decrease Outcome: Progressing

## 2020-09-11 LAB — GLUCOSE, CAPILLARY
Glucose-Capillary: 123 mg/dL — ABNORMAL HIGH (ref 70–99)
Glucose-Capillary: 120 mg/dL — ABNORMAL HIGH (ref 70–99)
Glucose-Capillary: 91 mg/dL (ref 70–99)
Glucose-Capillary: 122 mg/dL — ABNORMAL HIGH (ref 70–99)
Glucose-Capillary: 89 mg/dL (ref 70–99)
Glucose-Capillary: 113 mg/dL — ABNORMAL HIGH (ref 70–99)

## 2020-09-11 MED ORDER — JUVEN PO PACK
1.0000 | PACK | Freq: Two times a day (BID) | ORAL | Status: DC
  Administered 2020-09-11 – 2020-09-12 (×2): 1 via ORAL
  Filled 2020-09-11: qty 1

## 2020-09-11 MED ORDER — LEVETIRACETAM 500 MG PO TABS
1000.0000 mg | ORAL_TABLET | Freq: Two times a day (BID) | ORAL | Status: DC
  Administered 2020-09-11 – 2020-09-13 (×5): 1000 mg via ORAL
  Filled 2020-09-11 (×5): qty 2

## 2020-09-11 MED ORDER — LEVOTHYROXINE SODIUM 75 MCG PO TABS
75.0000 ug | ORAL_TABLET | Freq: Every day | ORAL | Status: DC
  Administered 2020-09-12 – 2020-09-13 (×2): 75 ug via ORAL
  Filled 2020-09-11 (×2): qty 1

## 2020-09-11 MED ORDER — PHENOBARBITAL 32.4 MG PO TABS
64.8000 mg | ORAL_TABLET | Freq: Every day | ORAL | Status: DC
  Administered 2020-09-11 – 2020-09-12 (×2): 64.8 mg via ORAL
  Filled 2020-09-11 (×2): qty 2

## 2020-09-11 MED ORDER — WHITE PETROLATUM EX OINT
TOPICAL_OINTMENT | CUTANEOUS | Status: AC
  Administered 2020-09-11: 0.2
  Filled 2020-09-11: qty 28.35

## 2020-09-11 MED ORDER — METOPROLOL TARTRATE 50 MG PO TABS
100.0000 mg | ORAL_TABLET | Freq: Two times a day (BID) | ORAL | Status: DC
  Administered 2020-09-11 – 2020-09-13 (×4): 100 mg via ORAL
  Filled 2020-09-11 (×4): qty 2

## 2020-09-11 MED ORDER — AMANTADINE HCL 100 MG PO CAPS
100.0000 mg | ORAL_CAPSULE | Freq: Two times a day (BID) | ORAL | Status: DC
  Administered 2020-09-12 – 2020-09-13 (×3): 100 mg via ORAL
  Filled 2020-09-11 (×4): qty 1

## 2020-09-11 MED ORDER — PANTOPRAZOLE SODIUM 40 MG PO TBEC
40.0000 mg | DELAYED_RELEASE_TABLET | Freq: Every day | ORAL | Status: DC
  Administered 2020-09-11 – 2020-09-13 (×3): 40 mg via ORAL
  Filled 2020-09-11 (×3): qty 1

## 2020-09-11 MED ORDER — APIXABAN 2.5 MG PO TABS
2.5000 mg | ORAL_TABLET | Freq: Two times a day (BID) | ORAL | Status: DC
  Administered 2020-09-11 – 2020-09-13 (×5): 2.5 mg via ORAL
  Filled 2020-09-11 (×4): qty 1

## 2020-09-11 NOTE — Progress Notes (Signed)
Nutrition Follow-up  DOCUMENTATION CODES:   Not applicable  INTERVENTION:  -Continue dysphagia 1 diet with thin liquids for comfort -Continuetube feeding viaCortrak: Osmolite 1.2at 59m/h(1320ml per day) Prosource [id number]  Provides1664kcal, 95gm protein, 10706mree water daily  -Continue 1 packet Juven BIDvia Cortrak, each packet provides 95 calories, 2.5 grams of protein (collagen), and 9.8 grams of carbohydrate (3 grams sugar); also contains 7 grams of L-arginine and L-glutamine, 300 mg vitamin C, 15 mg vitamin E, 1.2 mcg vitamin B-12, 9.5 mg zinc, 200 mg calcium, and 1.5 g Calcium Beta-hydroxy-Beta-methylbutyrate to support wound healing   Of note, it pt transitions to comfort care, should discontinue tube feeding and Cortrak and provide food as a source of comfort.   NUTRITION DIAGNOSIS:   Inadequate oral intake related to inability to eat as evidenced by NPO status. -- Progressing; pt now on dysphagia 1 diet  GOAL:   Patient will meet greater than or equal to 90% of their needs -- Met with TF  MONITOR:   TF tolerance, Diet advancement  REASON FOR ASSESSMENT:   Consult Enteral/tube feeding initiation and management  ASSESSMENT:   Pt with PMH of uncontrolled HTN who recently was hospitalized with a ICH. Pt was discharged home after inpatient rehab stay. Now admitted with seizures.  8/27 Cortrak placed (gastric) 9/6 advanced to dysphagia 1 diet with thin liquids for comfort 9/12 Cortrak replaced (gastric)  Per MD, there appears to be some confusion between the family regarding definition of/desire for comfort care. PMT following. Follow-up GOC discussion with family should be occurring this week. Plan to continue TF via cortrak for now with dysphagia 1 diet for comfort (pt has had minimal intake). Pt receiving Osmolite 1.2 cal @ 76m35m, 45ml11msource TF BID. This provides1664kcal, 95gm protein, 1070ml 38m water daily, meeting 100% of estimated  kcal and protein needs. If pt is to transition to comfort care, then Cortrak and TF should be discontinued.  Labs: Mg 2.5 (H), CBGs 104-123 Medications: Lasix, Novolog, Juven BID  Diet Order:   Diet Order            DIET - DYS 1 Room service appropriate? Yes; Fluid consistency: Thin  Diet effective now                 EDUCATION NEEDS:   No education needs have been identified at this time  Skin:  Skin Assessment: Skin Integrity Issues: Skin Integrity Issues:: Stage I Stage I: buttocks Other: MASD inner thighs  Last BM:  9/13  Height:   Ht Readings from Last 1 Encounters:  08/19/20 '5\' 7"'  (1.702 m)    Weight:   Wt Readings from Last 1 Encounters:  09/11/20 77.9 kg    Ideal Body Weight:  59.1 kg  BMI:  Body mass index is 26.9 kg/m.  Estimated Nutritional Needs:   Kcal:  1550-1750  Protein:  80-95 grams  Fluid:  >1.5 L/day    AmandaLarkin InaRD, LDN RD pager number and weekend/on-call pager number located in Amion.[place]

## 2020-09-11 NOTE — TOC Initial Note (Signed)
Transition of Care [facility]) - Initial/Assessment Note    Patient Details  Name: Lori Bridges MRN: [id number] Date of Birth: 02-21-1930  Transition of [facility]) CM/SW Contact:    Marguerita Merles, [place] Work Phone Number: 09/11/2020, 3:32 PM  Clinical Narrative:    MSW Student spoke with daughter about SNF. Provided options for CMS choice list. Family to review and get back with choice.            Expected Discharge Plan: Skilled Nursing Facility Barriers to Discharge: Continued Medical Work up   Patient Goals and CMS Choice Patient states their goals for this hospitalization and ongoing recovery are:: patient unable to participate in goal setting due to disorientation CMS Medicare.gov Compare Post Acute Care list provided to:: Patient Represenative (must comment) Choice offered to / list presented to : Adult Children  Expected Discharge Plan and Services Expected Discharge Plan: [place] Acute Care Choice: [place] arrangements for the past 2 months: Single Family Home                                      Prior Living Arrangements/Services Living arrangements for the past 2 months: Single Family Home   Patient language and need for interpreter reviewed:: No Do you feel safe going back to the place where you live?: Yes      Need for Family Participation in Patient Care: Yes (Comment) Care giver support system in place?: No (comment)   Criminal Activity/Legal Involvement Pertinent to Current Situation/Hospitalization: No - Comment as needed  Activities of Daily Living      Permission Sought/Granted Permission sought to share information with : Facility Sport and exercise psychologist, Family Supports Permission granted to share information with : (P) Yes, Verbal Permission Granted  Share Information with NAME: Horris Latino  Permission granted to share info w AGENCY: SNF  Permission granted to share info w  Relationship: Daughters     Emotional Assessment Appearance:: Appears stated age Attitude/Demeanor/Rapport: Unable to Assess Affect (typically observed): Unable to Assess Orientation: : Oriented to Self Alcohol / Substance Use: Not Applicable Psych Involvement: No (comment)  Admission diagnosis:  Seizure ([place]) [R56.9] Pneumonia [J18.9] Seizure disorder ([place]) [G40.909] 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[place]) [G40.201] Patient Active Problem List   Diagnosis Date Noted  . Poor fluid intake   . Adult failure to thrive   . DNR (do not resuscitate)   . Palliative care by specialist   . Altered mental status   . Pressure injury of skin 08/26/2020  . 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[place])   . Seizure disorder ([place]) 08/19/2020  . Fall   . Peripheral edema   . Sleep disturbance   . Hyperkalemia   . Hyponatremia   . Leukopenia   . Atrial fibrillation ([place])   . Thrombocytopenia ([place])   . Hypernatremia   . Benign essential HTN   . Dysphagia, post-stroke   . PAF (paroxysmal atrial fibrillation) ([place])   . Goals of care, counseling/discussion   . Gastroesophageal reflux disease   . Seizure ([place]) 07/26/2020  . Dysphagia due to recent stroke 07/26/2020  . UTI (urinary tract infection) 07/26/2020  . CKD (chronic kidney disease), stage IIIa 07/26/2020  . Hypothyroidism 07/26/2020  . ICH (intracerebral hemorrhage) (HCC) - 2 small L brain ICH, likely d/t HTN on subtherapeutic warfarin 07/22/2020  . Chronic  diastolic CHF (congestive heart failure) ([place]) 04/03/2017  . Pulmonary HTN ([place]) 04/03/2017  . Chest pain 04/03/2017  . Dizziness 06/30/2016  . Encounter for therapeutic drug monitoring 01/24/2014  . Hyperlipidemia 08/24/2013  . Long term (current) use of anticoagulants 05/17/2013  . Tricuspid regurgitation 03/04/2012  . Nausea 02/16/2012  . Personal history of colonic polyps 10/09/2011  . HYPERTENSION, MILD 09/11/2010  . Permanent atrial  fibrillation ([place]) 07/23/2010   PCP:  Derinda Late, MD Pharmacy:   [facility] [place], [place] [address] Phone: [phone] Fax: [phone]  [place] [address] [place]), [place] - [address] [place] [address] Phone: [phone] Fax: [phone]  [facility] [place], [place] [address] [place] [address] Phone: [phone] Fax: [phone]     Social Determinants of Health (SDOH) Interventions    Readmission Risk Interventions No flowsheet data found.

## 2020-09-11 NOTE — Progress Notes (Signed)
PROGRESS NOTE    Lori Bridges  PJA:[id number] DOB: 11/24/1930 DOA: 08/18/2020 PCP: Derinda Late, MD    Brief Narrative:  86 year old female past medical history of uncontrolled hypertension with recent cerebral hemorrhage requiring hospitalization, rehabilitation and then discharged home who was brought back to the hospital due to decreased level consciousness as well as right arm shaking. Concern was for acute status epilepticus. Neurology consulted and patient started on IV Keppra, IV Vimpat and phenobarbital. Initially placed in ICU in case she had to be intubated for acute airway protection, but this was not needed. Since then, patient has been able to be transferred out of ICU. Recent MRI noted acute CVA. Patient has remained sedated, felt to be in part due to her medications. Neurology decreased Vimpat on 9/3 in hopes that patient will become more alert however she did not. Neurology stopped her Vimpat altogether on morning of 9/6. Patient looked to be slightly more awake, but then started having jerking arm movements again.  At this point, neurology and palliative care have extensive discussion with patient's daughter. Patient has been changed to DNR.  Assessment & Plan:   Principal Problem:   Seizure ([place]) Active Problems:   Hyperlipidemia   Chronic diastolic CHF (congestive heart failure) (HCC)   ICH (intracerebral hemorrhage) (HCC) - 2 small L brain ICH, likely d/t HTN on subtherapeutic warfarin   CKD (chronic kidney disease), stage IIIa   Hypothyroidism   PAF (paroxysmal atrial fibrillation) (HCC)   Goals of care, counseling/discussion   Gastroesophageal reflux disease   Seizure disorder ([place])   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[place])   Pressure injury of skin   Palliative care by specialist   Altered mental status   DNR (do not resuscitate)   Adult failure to thrive   Poor fluid intake  Goals of Care:  Overall, Lori Bridges's overall  prognosis is poor.  Appreciate assistance by Palliative Care. Discussed case with Palliative. Family no longer seems to have any interest discussing with Palliative Care service and instead wishes to proceed with focusing on rehab and nutrition.   Seizure disorder with active seizure (HCC)/status epilepticus due to complex partial seizure:  Neurology c/s, appreciate recommendations - now signed off Pt obtunded on high dose AED's, continue keppra 1000 mg BID and phenobarb 60 mg daily as well as amantadine 100 mg BID for neurostimulation.  No further AED's as pt obtunded on high doses.  Ativan available prn. Goal is to minimize seizures and allow pt to wake enough to be able to take PO meals S/p solumedrol x3 days PO diet with aspiration precautions Recommend pt to follow with neurology outpatient when discharged  Acute on chronic diastolic CHF (congestive heart failure) ([place]):  Echo from 05/2020 with EF 34-19%, diastolic function could not be evaluated Continue lasix BID CXR - pulmonary vascular congestion and perihilar edema, small effusions Continue with strict I/O, daily weights No LE edema this AM  ICH (intracerebral hemorrhage) (HCC)  Acute Subcentimeter R Cerebellar Infarct - 2 small L brain ICH, likely d/t HTN on subtherapeutic warfarin (now on eliquis, ok per neuro).  Also with acute subcentimeter R cerebellar infarct.  No additional w/u noted per neurology.  CKD (chronic kidney disease), stage IIIa: Stable. At baseline.  Hypothyroidism: Continue Synthroid as tolerated  Altered mental status/acute encephalopathy: Felt to be secondary to seizures and antiseizure medication. Remains very drowsy today and mildly communicative  Atrial fibrillation with rapid ventricular rate, permanent: Previously on Coumadin, but has been discontinued  since development of intraparenchymal hemorrhage during last hospitalization.  She's now on low dose eliquis per neurology.Follow on metoprolol.     Acute CVA: MRI noted acute subcentimeter right cerebellar infarct: Neurology following.  No additional w/u per neurology noted  Obesity: Meets criteria BMI greater than 30  FEN: -Had been dependent on tube feed via cortak. NG tube clogged and was replaced 9/13 -Coretrak later pulled out by pt 9/14 AM -Staff reports pt tolerates PO nutrition if assisted and has thus far tolerated PO meds. -Will continue without NG tube and continue to encourage PO as tolerated   DVT prophylaxis: Eliquis Code Status: DNR Family Communication: pt in room, family currently at bedsie  Status is: Inpatient  Remains inpatient appropriate because:Unsafe d/c plan   Dispo:  Patient From:    Planned Disposition: Residential Hospice  Expected discharge date: 09/14/20  Medically stable for discharge:    Consultants:   Neurology  Palliative Care  Procedures:  EEG 8/25  Antimicrobials: Anti-infectives (From admission, onward)   None      Subjective: Unable to assess given mentation  Objective: Vitals:   09/11/20 0708 09/11/20 1116 09/11/20 1311 09/11/20 1312  BP: 111/79 112/75 113/78 115/74  Pulse: (!) 113  (!) 114 (!) 120  Resp: (!) 21  (!) 22 19  Temp: 98.2 F (36.8 C) 98.3 F (36.8 C) 98.7 F (37.1 C) 98.2 F (36.8 C)  TempSrc: Oral  Oral   SpO2: 97%  96% 98%  Weight:      Height:        Intake/Output Summary (Last 24 hours) at 09/11/2020 1534 Last data filed at 09/11/2020 0300 Gross per 24 hour  Intake 470 ml  Output 1050 ml  Net -580 ml   Filed Weights   09/09/20 0500 09/10/20 0500 09/11/20 0500  Weight: 83.4 kg 78.8 kg 77.9 kg    Examination: General exam: Not very conversant, in no acute distress, being fed lunch by family when seen Respiratory system: normal chest rise, clear, no audible wheezing Cardiovascular system: regular rhythm, s1-s2 Gastrointestinal system: Nondistended, nontender, pos BS Central nervous system: No seizures, no tremors Extremities:  No cyanosis, no joint deformities Skin: No rashes, no pallor Psychiatry: Affect normal // no auditory hallucinations    Data Reviewed: I have personally reviewed following labs and imaging studies  CBC: Recent Labs  Lab 09/05/20 1020 09/06/20 0450 09/07/20 1443 09/08/20 0344  WBC 8.2 6.8 7.8 7.4  NEUTROABS 6.6 5.7 6.3 5.7  HGB 11.9* 12.0 13.3 13.0  HCT 39.0 39.1 42.0 41.9  MCV 96.8 96.5 95.7 96.3  PLT 235 PLATELET CLUMPS NOTED ON SMEAR, UNABLE TO ESTIMATE 274 009   Basic Metabolic Panel: Recent Labs  Lab 09/05/20 1020 09/06/20 0450 09/07/20 0204 09/08/20 0344  NA 142 141 142 144  K 4.2 5.1 4.2 4.1  CL 106 106 103 103  CO2 27 24 31  34*  GLUCOSE 131* 95 132* 93  BUN 60* 64* 72* 75*  CREATININE 0.89 0.85 0.96 1.07*  CALCIUM 10.9* 10.5* 10.4* 10.3  MG  --  2.7* 2.4 2.5*  PHOS  --  2.3* 2.5 2.6   GFR: Estimated Creatinine Clearance: 39.1 mL/min (A) (by C-G formula based on SCr of 1.07 mg/dL (H)). Liver Function Tests: Recent Labs  Lab 09/05/20 1020 09/06/20 0450 09/07/20 0204 09/08/20 0344  AST 101* 93* 64* 41  ALT 80* 76* 71* 57*  ALKPHOS 164* 153* 153* 140*  BILITOT 1.0 1.0 0.6 0.7  PROT 6.2* 5.7* 5.4* 5.6*  ALBUMIN 3.0* 2.7* 2.8* 2.8*   No results for input(s): LIPASE, AMYLASE in the last 168 hours. No results for input(s): AMMONIA in the last 168 hours. Coagulation Profile: No results for input(s): INR, PROTIME in the last 168 hours. Cardiac Enzymes: No results for input(s): CKTOTAL, CKMB, CKMBINDEX, TROPONINI in the last 168 hours. BNP (last 3 results) No results for input(s): PROBNP in the last 8760 hours. HbA1C: No results for input(s): HGBA1C in the last 72 hours. CBG: Recent Labs  Lab 09/10/20 2307 09/11/20 0358 09/11/20 0744 09/11/20 1139 09/11/20 1316  GLUCAP 104* 123* 120* 91 122*   Lipid Profile: No results for input(s): CHOL, HDL, LDLCALC, TRIG, CHOLHDL, LDLDIRECT in the last 72 hours. Thyroid Function Tests: No results for  input(s): TSH, T4TOTAL, FREET4, T3FREE, THYROIDAB in the last 72 hours. Anemia Panel: No results for input(s): VITAMINB12, FOLATE, FERRITIN, TIBC, IRON, RETICCTPCT in the last 72 hours. Sepsis Labs: No results for input(s): PROCALCITON, LATICACIDVEN in the last 168 hours.  No results found for this or any previous visit (from the past 240 hour(s)).   Radiology Studies: No results found.  Scheduled Meds: . amantadine  100 mg Per Tube BID  . apixaban  2.5 mg Oral BID  . chlorhexidine  15 mL Mouth Rinse BID  . Chlorhexidine Gluconate Cloth  6 each Topical Daily  . furosemide  40 mg Intravenous BID  . insulin aspart  0-9 Units Subcutaneous Q4H  . levETIRAcetam  1,000 mg Oral BID  . [START ON 09/12/2020] levothyroxine  75 mcg Oral Q0600  . mouth rinse  15 mL Mouth Rinse q12n4p  . metoprolol tartrate  100 mg Oral BID  . nutrition supplement (JUVEN)  1 packet Oral BID BM  . pantoprazole  40 mg Oral Daily  . phenobarbital  64.8 mg Oral QHS  . sodium chloride flush  10-40 mL Intracatheter Q12H   Continuous Infusions:    LOS: 23 days   Marylu Lund, MD [facility] Pager On Amion  If 7PM-7AM, please contact night-coverage 09/11/2020, 3:34 PM

## 2020-09-11 NOTE — NC FL2 (Addendum)
[place] LEVEL OF CARE SCREENING TOOL     IDENTIFICATION  Patient Name: Lori Bridges Birthdate: [birth year] Sex: female Admission Date (Current Location): 08/18/2020  [facility] and [place] Number:  Herbalist and Address:  [facility], [place] [address] [place], [place] [address]      Provider Number: [id number]  Attending Physician Name and Address:  Donne Hazel, MD  Relative Name and Phone Number:       Current Level of Care: Hospital Recommended Level of Care: [place] Prior Approval Number:    Date Approved/Denied:   PASRR Number:    Discharge Plan: SNF    Current Diagnoses: Patient Active Problem List   Diagnosis Date Noted  . Poor fluid intake   . Adult failure to thrive   . DNR (do not resuscitate)   . Palliative care by specialist   . Altered mental status   . Pressure injury of skin 08/26/2020  . 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[place])   . Seizure disorder ([place]) 08/19/2020  . Fall   . Peripheral edema   . Sleep disturbance   . Hyperkalemia   . Hyponatremia   . Leukopenia   . Atrial fibrillation ([place])   . Thrombocytopenia ([place])   . Hypernatremia   . Benign essential HTN   . Dysphagia, post-stroke   . PAF (paroxysmal atrial fibrillation) ([place])   . Goals of care, counseling/discussion   . Gastroesophageal reflux disease   . Seizure ([place]) 07/26/2020  . Dysphagia due to recent stroke 07/26/2020  . UTI (urinary tract infection) 07/26/2020  . CKD (chronic kidney disease), stage IIIa 07/26/2020  . Hypothyroidism 07/26/2020  . ICH (intracerebral hemorrhage) (HCC) - 2 small L brain ICH, likely d/t HTN on subtherapeutic warfarin 07/22/2020  . Chronic diastolic CHF (congestive heart failure) ([place]) 04/03/2017  . Pulmonary HTN ([place]) 04/03/2017  . Chest pain 04/03/2017  . Dizziness 06/30/2016  . Encounter for therapeutic drug monitoring 01/24/2014  . Hyperlipidemia 08/24/2013   . Long term (current) use of anticoagulants 05/17/2013  . Tricuspid regurgitation 03/04/2012  . Nausea 02/16/2012  . Personal history of colonic polyps 10/09/2011  . HYPERTENSION, MILD 09/11/2010  . Permanent atrial fibrillation ([place]) 07/23/2010    Orientation RESPIRATION BLADDER Height & Weight     Self  Normal Continent Weight: 171 lb 11.8 oz (77.9 kg) Height:  5\' 7"  (170.2 cm)  BEHAVIORAL SYMPTOMS/MOOD NEUROLOGICAL BOWEL NUTRITION STATUS      Incontinent Diet (Fluid Consistency, Thin)  AMBULATORY STATUS COMMUNICATION OF NEEDS Skin   Total Care Non-Verbally Normal                       Personal Care Assistance Level of Assistance  Bathing, Feeding, Dressing Bathing Assistance: Maximum assistance Feeding assistance: Maximum assistance Dressing Assistance: Maximum assistance     Functional Limitations Info  Speech (incomprehensible)     Speech Info: Impaired    SPECIAL CARE FACTORS FREQUENCY  PT (By licensed PT), OT (By licensed OT), Speech therapy     PT Frequency: 5xweek OT Frequency: 5xweek     Speech Therapy Frequency: 5xweek      Contractures Contractures Info: Not present    Additional Factors Info  Code Status, Allergies Code Status Info: DNR             Current Medications (09/11/2020):  This is the current hospital active medication list Current Facility-Administered Medications  Medication Dose Route  Frequency Provider Last Rate Last Admin  . acetaminophen (TYLENOL) tablet 650 mg  650 mg Oral Q6H PRN Kipp Brood, MD   650 mg at 09/09/20 1220   Or  . acetaminophen (TYLENOL) suppository 650 mg  650 mg Rectal Q6H PRN Kipp Brood, MD   650 mg at 09/03/20 1545  . amantadine (SYMMETREL) 50 MG/5ML solution 100 mg  100 mg Per Tube BID Elodia Florence., MD   100 mg at 09/10/20 2111  . apixaban (ELIQUIS) tablet 2.5 mg  2.5 mg Oral BID Donne Hazel, MD   2.5 mg at 09/11/20 1059  . chlorhexidine (PERIDEX) 0.12 % solution 15 mL  15 mL  Mouth Rinse BID Agarwala, Einar Grad, MD   15 mL at 09/11/20 1059  . Chlorhexidine Gluconate Cloth 2 % PADS 6 each  6 each Topical Daily Kipp Brood, MD   6 each at 09/10/20 1029  . furosemide (LASIX) injection 40 mg  40 mg Intravenous BID Elodia Florence., MD   40 mg at 09/11/20 0818  . insulin aspart (novoLOG) injection 0-9 Units  0-9 Units Subcutaneous Q4H Kipp Brood, MD   1 Units at 09/11/20 0420  . labetalol (NORMODYNE) injection 10 mg  10 mg Intravenous Q6H PRN Kipp Brood, MD   10 mg at 08/24/20 1848  . levETIRAcetam (KEPPRA) tablet 1,000 mg  1,000 mg Oral BID Donne Hazel, MD   1,000 mg at 09/11/20 1058  . [START ON 09/12/2020] levothyroxine (SYNTHROID) tablet 75 mcg  75 mcg Oral Q0600 Donne Hazel, MD      . LORazepam (ATIVAN) injection 2 mg  2 mg Intravenous PRN Kipp Brood, MD   2 mg at 08/23/20 1544  . MEDLINE mouth rinse  15 mL Mouth Rinse q12n4p Agarwala, Ravi, MD   15 mL at 09/10/20 1119  . metoprolol tartrate (LOPRESSOR) injection 5 mg  5 mg Intravenous Q6H PRN Annita Brod, MD   5 mg at 09/10/20 1119  . metoprolol tartrate (LOPRESSOR) tablet 100 mg  100 mg Oral BID Donne Hazel, MD      . nutrition supplement (JUVEN) (JUVEN) powder packet 1 packet  1 packet Oral BID BM Donne Hazel, MD      . ondansetron [facility]) tablet 4 mg  4 mg Oral Q6H PRN Kipp Brood, MD       Or  . ondansetron (ZOFRAN) injection 4 mg  4 mg Intravenous Q6H PRN Agarwala, Einar Grad, MD      . pantoprazole (PROTONIX) EC tablet 40 mg  40 mg Oral Daily Donne Hazel, MD      . PHENobarbital (LUMINAL) tablet 64.8 mg  64.8 mg Oral QHS Donne Hazel, MD      . sodium chloride flush (NS) 0.9 % injection 10-40 mL  10-40 mL Intracatheter Q12H Elodia Florence., MD   10 mL at 09/10/20 2113  . sodium chloride flush (NS) 0.9 % injection 10-40 mL  10-40 mL Intracatheter PRN Elodia Florence., MD         Discharge Medications: Please see discharge summary for a list of discharge  medications.  Relevant Imaging Results:  Relevant Lab Results:   Additional Information    Marguerita Merles, Student-Social Work

## 2020-09-12 LAB — GLUCOSE, CAPILLARY
Glucose-Capillary: 100 mg/dL — ABNORMAL HIGH (ref 70–99)
Glucose-Capillary: 108 mg/dL — ABNORMAL HIGH (ref 70–99)
Glucose-Capillary: 116 mg/dL — ABNORMAL HIGH (ref 70–99)
Glucose-Capillary: 131 mg/dL — ABNORMAL HIGH (ref 70–99)
Glucose-Capillary: 90 mg/dL (ref 70–99)
Glucose-Capillary: 92 mg/dL (ref 70–99)

## 2020-09-12 LAB — CBC
HCT: 39.3 % (ref 36.0–46.0)
Hemoglobin: 12.3 g/dL (ref 12.0–15.0)
MCH: 30.9 pg (ref 26.0–34.0)
MCHC: 31.3 g/dL (ref 30.0–36.0)
MCV: 98.7 fL (ref 80.0–100.0)
Platelets: 259 10*3/uL (ref 150–400)
RBC: 3.98 MIL/uL (ref 3.87–5.11)
RDW: 16.1 % — ABNORMAL HIGH (ref 11.5–15.5)
WBC: 6.2 10*3/uL (ref 4.0–10.5)
nRBC: 0 % (ref 0.0–0.2)

## 2020-09-12 LAB — COMPREHENSIVE METABOLIC PANEL
ALT: 46 U/L — ABNORMAL HIGH (ref 0–44)
AST: 41 U/L (ref 15–41)
Albumin: 2.8 g/dL — ABNORMAL LOW (ref 3.5–5.0)
Alkaline Phosphatase: 157 U/L — ABNORMAL HIGH (ref 38–126)
Anion gap: 9 (ref 5–15)
BUN: 45 mg/dL — ABNORMAL HIGH (ref 8–23)
CO2: 32 mmol/L (ref 22–32)
Calcium: 10 mg/dL (ref 8.9–10.3)
Chloride: 106 mmol/L (ref 98–111)
Creatinine, Ser: 0.93 mg/dL (ref 0.44–1.00)
GFR calc Af Amer: >60 mL/min (ref >60)
GFR calc non Af Amer: 55 mL/min — ABNORMAL LOW (ref >60)
Glucose, Bld: 96 mg/dL (ref 70–99)
Potassium: 3.6 mmol/L (ref 3.5–5.1)
Sodium: 147 mmol/L — ABNORMAL HIGH (ref 135–145)
Total Bilirubin: 0.8 mg/dL (ref 0.3–1.2)
Total Protein: 5.7 g/dL — ABNORMAL LOW (ref 6.5–8.1)

## 2020-09-12 MED ORDER — POTASSIUM CHLORIDE CRYS ER 20 MEQ PO TBCR
40.0000 meq | EXTENDED_RELEASE_TABLET | Freq: Once | ORAL | Status: AC
  Administered 2020-09-12: 40 meq via ORAL
  Filled 2020-09-12: qty 2

## 2020-09-12 MED ORDER — ENSURE ENLIVE PO LIQD
237.0000 mL | Freq: Three times a day (TID) | ORAL | Status: DC
  Administered 2020-09-12 – 2020-09-13 (×3): 237 mL via ORAL

## 2020-09-12 MED ORDER — FUROSEMIDE 10 MG/ML IJ SOLN
40.0000 mg | Freq: Every day | INTRAMUSCULAR | Status: DC
  Administered 2020-09-13: 40 mg via INTRAVENOUS
  Filled 2020-09-12: qty 4

## 2020-09-12 MED ORDER — ADULT MULTIVITAMIN W/MINERALS CH
1.0000 | ORAL_TABLET | Freq: Every day | ORAL | Status: DC
  Administered 2020-09-12 – 2020-09-13 (×2): 1 via ORAL
  Filled 2020-09-12 (×2): qty 1

## 2020-09-12 NOTE — TOC Progression Note (Signed)
Transition of Care (TOC) - Progression Note    Patient Details  Name: Lori Bridges MRN: [id number] Date of Birth: 07/23/1930  Transition of Care (TOC) CM/SW Contact   M , LCSW Phone Number: 09/12/2020, 3:55 PM  Clinical Narrative:   CSW met with daughter, Barbara, to discuss SNF options. Family would like [facility] as top choice, with [facility] as second choice. [facility] able to accept tomorrow. CSW to follow.    Expected Discharge Plan: Skilled Nursing Facility Barriers to Discharge: Continued Medical Work up  Expected Discharge Plan and Services Expected Discharge Plan: Skilled Nursing Facility     Post Acute Care Choice: Skilled Nursing Facility Living arrangements for the past 2 months: Single Family Home                                       Social Determinants of Health (SDOH) Interventions    Readmission Risk Interventions No flowsheet data found.  

## 2020-09-12 NOTE — Progress Notes (Signed)
Occupational Therapy Treatment Patient Details Name: Lori Bridges MRN: [id number] DOB: Jul 08, 1930 Today's Date: 09/12/2020    History of present illness Lori Bridges is a 86 y.o. female with medical history significant of paroxysmal A. fib, chronic diastolic CHF, hypertension, hyperlipidemia, GERD, recent ICH, degenerative disc disease, CKD stage IIIa presents to emergency department with right-sided seizure-like activity. Pt just returned home on 8/19 from CIR s/p stroke affecting right side.   OT comments  Pt on RA with stable BP 112/89 (98) after movement and 124/78 (88) static sitting eob. Pt with HR 118-128 with max HR 137 observed. Pt able to verbalize first and last name when asked. Pt completed sit<>stand x2 total +2 max with recommendation of maximove with RN staff. Recommendation for SNF at this time.    Follow Up Recommendations  SNF;Supervision/Assistance - 24 hour    Equipment Recommendations  Wheelchair cushion (measurements OT);Wheelchair (measurements OT);Hospital bed    Recommendations for Other Services      Precautions / Restrictions Precautions Precautions: Fall Precaution Comments: HOH Restrictions Weight Bearing Restrictions: No       Mobility Bed Mobility Overal bed mobility: Needs Assistance Bed Mobility: Rolling;Supine to Sit Rolling: Mod assist   Supine to sit: Mod assist     General bed mobility comments: pt requires (A) to using L UE to hold the foot board of the bed. pt with strong R lateral lean bias.   Transfers Overall transfer level: Needs assistance Equipment used: 2 person hand held assist Transfers: Sit to/from [facility] Sit to Stand: +2 physical assistance;Mod assist Stand pivot transfers: +2 physical assistance;Max assist       General transfer comment: R LE blocked for sit <>stand. Pt requires increase (A) on R side due to R lean. Pt with transfer lifting R LE completely off the ground and barely on L LE.  Recommend transfer back via maximove.    Balance Overall balance assessment: Needs assistance Sitting-balance support: Single extremity supported;Feet supported Sitting balance-Leahy Scale: Poor Sitting balance - Comments: R lateral lean                                   ADL either performed or assessed with clinical judgement   ADL Overall ADL's : Needs assistance/impaired       Grooming Details (indicate cue type and reason): noted to white on tongue Upper Body Bathing: Maximal assistance   Lower Body Bathing: Total assistance   Upper Body Dressing : Maximal assistance   Lower Body Dressing: Total assistance   Toilet Transfer: +2 for physical assistance;Maximal assistance   Toileting- Clothing Manipulation and Hygiene: Total assistance         General ADL Comments: pt with strong R lateral lean      Vision   Additional Comments: L gaze preference does not scan to midline or superior visual field of the L    Perception     Praxis      Cognition Arousal/Alertness: Awake/alert Behavior During Therapy: Flat affect Overall Cognitive Status: Impaired/Different from baseline Area of Impairment: Following commands                 Orientation Level: Disoriented to;Place;Time;Situation (able to state DOB as [birth year] ) Current Attention Level: Sustained   Following Commands: Follows one step commands with increased time Safety/Judgement: Decreased awareness of safety;Decreased awareness of deficits Awareness: Intellectual Problem Solving: Decreased initiation;Difficulty sequencing;Slow processing;Requires verbal cues;Requires tactile  cues General Comments: pt with R inattention and L gaze preference.         Exercises Other Exercises Other Exercises: with R hand in visual field able to demonstrate 2 digit extension, 3 digit and full hand open and close into fist. pt with decreased shoulder activation. pt with activation at hand not shoulder    Shoulder Instructions       General Comments edema noted throughout R side, impaired vision    Pertinent Vitals/ Pain       Pain Assessment: Faces Faces Pain Scale: Hurts little more Pain Location: R LE with hip movement Pain Descriptors / Indicators: Grimacing Pain Intervention(s): Repositioned;Monitored during session  Home Living                                          Prior Functioning/Environment              Frequency  Min 2X/week        Progress Toward Goals  OT Goals(current goals can now be found in the care plan section)  Progress towards OT goals: Progressing toward goals  Acute Rehab OT Goals Patient Stated Goal: does not state OT Goal Formulation: With family Time For Goal Achievement: 09/26/20 Potential to Achieve Goals: Fair ADL Goals Pt Will Perform Grooming: with min assist;bed level;sitting Pt Will Transfer to Toilet: with mod assist;bedside commode;stand pivot transfer Pt/caregiver will Perform Home Exercise Program: Increased ROM;Increased strength;Both right and left upper extremity;With written HEP provided Additional ADL Goal #1: Pt will be able to track left and right to find objects on table in front of her Additional ADL Goal #2: Pt will follow 1 step commands 50% of time Additional ADL Goal #3: Pt will roll with Min A to A with baisc ADLs Additional ADL Goal #4: Pt will able to sit EOB with min A in prep for transfers  Plan Discharge plan remains appropriate    Co-evaluation    PT/OT/SLP Co-Evaluation/Treatment: Yes Reason for Co-Treatment: For patient/therapist safety;To address functional/ADL transfers;Necessary to address cognition/behavior during functional activity   OT goals addressed during session: ADL's and self-care;Proper use of Adaptive equipment and DME;Strengthening/ROM      AM-PAC OT "6 Clicks" Daily Activity     Outcome Measure   Help from another person eating meals?: Total Help from  another person taking care of personal grooming?: Total Help from another person toileting, which includes using toliet, bedpan, or urinal?: Total Help from another person bathing (including washing, rinsing, drying)?: Total Help from another person to put on and taking off regular upper body clothing?: Total Help from another person to put on and taking off regular lower body clothing?: Total 6 Click Score: 6    End of Session Equipment Utilized During Treatment: Gait belt  OT Visit Diagnosis: Other abnormalities of gait and mobility (R26.89);Muscle weakness (generalized) (M62.81);Low vision, both eyes (H54.2);Other symptoms and signs involving cognitive function;Hemiplegia and hemiparesis Hemiplegia - dominant/non-dominant: Dominant   Activity Tolerance Patient tolerated treatment well   Patient Left in chair;with call bell/phone within reach;with chair alarm set;with nursing/sitter in room   Nurse Communication Mobility status;Precautions;Weight bearing status        Time: 9373-4287 OT Time Calculation (min): 29 min  Charges: OT General Charges $OT Visit: 1 Visit OT Treatments $Therapeutic Activity: 8-22 mins   Brynn, OTR/L  Acute Rehabilitation Services Pager: [phone] Office: [phone] .  Jeri Modena 09/12/2020, 12:58 PM

## 2020-09-12 NOTE — Progress Notes (Signed)
Physical Therapy Treatment Patient Details Name: Lori Bridges MRN: [id number] DOB: 1930/01/22 Today's Date: 09/12/2020    History of Present Illness Lori Bridges is a 86 y.o. female with medical history significant of paroxysmal A. fib, chronic diastolic CHF, hypertension, hyperlipidemia, GERD, recent ICH, degenerative disc disease, CKD stage IIIa presents to emergency department with right-sided seizure-like activity. Pt just returned home on 8/19 from CIR s/p stroke affecting right side.    PT Comments    Patient awake/alert and speaking today (single words mostly). She was able to participate in mobility far better today with sit to stand twice (+2 mod assist) and pivot to chair with +2 max assist. PT goals updated.   Follow Up Recommendations  SNF     Equipment Recommendations  Other (comment) (to be assessed at next venue)    Recommendations for Other Services       Precautions / Restrictions Precautions Precautions: Fall Precaution Comments: HOH Restrictions Weight Bearing Restrictions: No    Mobility  Bed Mobility Overal bed mobility: Needs Assistance Bed Mobility: Rolling;Supine to Sit Rolling: Mod assist   Supine to sit: Mod assist     General bed mobility comments: pt requires (A) to using L UE to hold the foot board of the bed. pt with strong R lateral lean bias.   Transfers Overall transfer level: Needs assistance Equipment used: 2 person hand held assist Transfers: Sit to/from [facility] Sit to Stand: +2 physical assistance;Mod assist Stand pivot transfers: +2 physical assistance;Max assist       General transfer comment: R LE blocked for sit <>stand. Pt requires increase (A) on R side due to R lean. Pt with transfer lifting R LE completely off the ground and barely on L LE. Recommend transfer back via maximove.  Ambulation/Gait                 Stairs             Wheelchair Mobility    Modified Rankin (Stroke  Patients Only) Modified Rankin (Stroke Patients Only) Pre-Morbid Rankin Score: Moderately severe disability Modified Rankin: Severe disability     Balance Overall balance assessment: Needs assistance Sitting-balance support: Single extremity supported;Feet supported Sitting balance-Leahy Scale: Poor Sitting balance - Comments: R lateral lean   Standing balance support: Bilateral upper extremity supported Standing balance-Leahy Scale: Poor Standing balance comment: 2 person support to stand while RN changed sacral foam                            Cognition Arousal/Alertness: Awake/alert Behavior During Therapy: Flat affect Overall Cognitive Status: Impaired/Different from baseline Area of Impairment: Following commands                 Orientation Level: Disoriented to;Place;Time;Situation (able to state DOB as [birth year] ) Current Attention Level: Sustained   Following Commands: Follows one step commands with increased time Safety/Judgement: Decreased awareness of safety;Decreased awareness of deficits Awareness: Intellectual Problem Solving: Decreased initiation;Difficulty sequencing;Slow processing;Requires verbal cues;Requires tactile cues General Comments: pt with R inattention and L gaze preference.       Exercises Other Exercises Other Exercises: PROM RLE from hip abdct, flexion, external rotation into opposite directions. Only able to achieve neutral rotation. Once in recliner, placed roll to rt lateral thigh to maintain neutral rotation/passive stretch of hip external rotators.     General Comments General comments (skin integrity, edema, etc.): edema noted throughout R side, impaired vision  Pertinent Vitals/Pain Pain Assessment: Faces Faces Pain Scale: Hurts little more Pain Location: R LE with hip movement Pain Descriptors / Indicators: Grimacing Pain Intervention(s): Limited activity within patient's tolerance;Repositioned    Home Living                       Prior Function            PT Goals (current goals can now be found in the care plan section) Acute Rehab PT Goals Patient Stated Goal: does not state PT Goal Formulation: Patient unable to participate in goal setting Time For Goal Achievement: 09/26/20 Potential to Achieve Goals: Good Progress towards PT goals:  (goals updated due to timeframe)    Frequency    Min 2X/week      PT Plan Current plan remains appropriate    Co-evaluation PT/OT/SLP Co-Evaluation/Treatment: Yes Reason for Co-Treatment: Complexity of the patient's impairments (multi-system involvement);For patient/therapist safety;To address functional/ADL transfers PT goals addressed during session: Mobility/safety with mobility;Balance OT goals addressed during session: ADL's and self-care;Proper use of Adaptive equipment and DME;Strengthening/ROM      AM-PAC PT "6 Clicks" Mobility   Outcome Measure  Help needed turning from your back to your side while in a flat bed without using bedrails?: A Lot Help needed moving from lying on your back to sitting on the side of a flat bed without using bedrails?: A Lot Help needed moving to and from a bed to a chair (including a wheelchair)?: A Lot Help needed standing up from a chair using your arms (e.g., wheelchair or bedside chair)?: A Lot Help needed to walk in hospital room?: Total Help needed climbing 3-5 steps with a railing? : Total 6 Click Score: 10    End of Session Equipment Utilized During Treatment: Gait belt Activity Tolerance: Patient tolerated treatment well Patient left: in chair;with call bell/phone within reach;with chair alarm set;with nursing/sitter in room Nurse Communication: Mobility status;Need for lift equipment (?stedy vs maximove) PT Visit Diagnosis: Muscle weakness (generalized) (M62.81)     Time: 1696-7893 PT Time Calculation (min) (ACUTE ONLY): 32 min  Charges:  $Neuromuscular Re-education: 8-22  mins                      Arby Barrette, PT Pager [phone]    Rexanne Mano 09/12/2020, 1:12 PM

## 2020-09-12 NOTE — Progress Notes (Signed)
Nutrition Follow-up  DOCUMENTATION CODES:   Not applicable  INTERVENTION:  Ensure Enlive po TID, each supplement provides 350 kcal and 20 grams of protein  MVI daily   Magic cup TID with meals, each supplement provides 290 kcal and 9 grams of protein  D/c Juven  NUTRITION DIAGNOSIS:   Inadequate oral intake related to inability to eat as evidenced by NPO status. -- Progressing; pt now on dysphagia 1 diet  GOAL:   Patient will meet greater than or equal to 90% of their needs -- Progressing  MONITOR:   PO intake, Supplement acceptance, Diet advancement, Weight trends, Labs  REASON FOR ASSESSMENT:   Consult Enteral/tube feeding initiation and management  ASSESSMENT:   Pt with PMH of uncontrolled HTN who recently was hospitalized with a ICH. Pt was discharged home after inpatient rehab stay. Now admitted with seizures.  8/27 Cortrak placed (gastric) 9/6 advanced to dysphagia 1 diet with thin liquids for comfort 9/13 Cortrak replaced (gastric) 9/14 pt pulled Cortrak  Per MD, pt's family appears to have no more interest in discussing with Palliative Care team and instead wishes to proceed with focusing on rehab and nutrition. However, pt pulled out Cortrak and MD would like to continue without an NG tube and encourage PO as tolerated. Pt has consumed 25% of 1 documented meal since this occurred.   Of note, pt has been consuming her Juven PO since pulling her Cortrak. RD concerned this will be too much volume when combined with other oral nutrition supplements needed now that pt is no longer receiving TF. RD will discontinue Juven as pt will benefit more from Ensure at this time given its higher protein/calorie content. If pt consumes Ensure well and is meeting her kcal/protein goals, can consider ordering Juven again.   Labs: Na 147 (H), CBGs 90-108-92 Medications: Lasix, Novolog, Juven, Protonix   Diet Order:   Diet Order            DIET - DYS 1 Room service  appropriate? Yes; Fluid consistency: Thin  Diet effective now                 EDUCATION NEEDS:   No education needs have been identified at this time  Skin:  Skin Assessment: Skin Integrity Issues: Skin Integrity Issues:: Stage I Stage I: buttocks Other: MASD inner thighs  Last BM:  9/13  Height:   Ht Readings from Last 1 Encounters:  08/19/20 5\' 7"  (1.702 m)    Weight:   Wt Readings from Last 1 Encounters:  09/11/20 77.9 kg    Ideal Body Weight:  59.1 kg  BMI:  Body mass index is 26.9 kg/m.  Estimated Nutritional Needs:   Kcal:  1550-1750  Protein:  80-95 grams  Fluid:  >1.5 L/day    Larkin Ina, MS, RD, LDN RD pager number and weekend/on-call pager number located in [place].

## 2020-09-12 NOTE — NC FL2 (Signed)
[place] LEVEL OF CARE SCREENING TOOL     IDENTIFICATION  Patient Name: Lori Bridges Birthdate: 03/13/1930 Sex: female Admission Date (Current Location): 08/18/2020  [facility] and [place] Number:  Herbalist and Address:  [facility], [place] [address], [place], [place] [address]      Provider Number: [id number]  Attending Physician Name and Address:  Bonnielee Haff, MD  Relative Name and Phone Number:       Current Level of Care: Hospital Recommended Level of Care: [place] Prior Approval Number:    Date Approved/Denied:   PASRR Number: [id number]  Discharge Plan: SNF    Current Diagnoses: Patient Active Problem List   Diagnosis Date Noted  . Poor fluid intake   . Adult failure to thrive   . DNR (do not resuscitate)   . Palliative care by specialist   . Altered mental status   . Pressure injury of skin 08/26/2020  . 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[place])   . Seizure disorder ([place]) 08/19/2020  . Fall   . Peripheral edema   . Sleep disturbance   . Hyperkalemia   . Hyponatremia   . Leukopenia   . Atrial fibrillation ([place])   . Thrombocytopenia ([place])   . Hypernatremia   . Benign essential HTN   . Dysphagia, post-stroke   . PAF (paroxysmal atrial fibrillation) ([place])   . Goals of care, counseling/discussion   . Gastroesophageal reflux disease   . Seizure ([place]) 07/26/2020  . Dysphagia due to recent stroke 07/26/2020  . UTI (urinary tract infection) 07/26/2020  . CKD (chronic kidney disease), stage IIIa 07/26/2020  . Hypothyroidism 07/26/2020  . ICH (intracerebral hemorrhage) (HCC) - 2 small L brain ICH, likely d/t HTN on subtherapeutic warfarin 07/22/2020  . Chronic diastolic CHF (congestive heart failure) ([place]) 04/03/2017  . Pulmonary HTN ([place]) 04/03/2017  . Chest pain 04/03/2017  . Dizziness 06/30/2016  . Encounter for therapeutic drug monitoring 01/24/2014  . Hyperlipidemia  08/24/2013  . Long term (current) use of anticoagulants 05/17/2013  . Tricuspid regurgitation 03/04/2012  . Nausea 02/16/2012  . Personal history of colonic polyps 10/09/2011  . HYPERTENSION, MILD 09/11/2010  . Permanent atrial fibrillation ([place]) 07/23/2010    Orientation RESPIRATION BLADDER Height & Weight     Self  Normal Indwelling catheter Weight: 171 lb 11.8 oz (77.9 kg) Height:  5\' 7"  (170.2 cm)  BEHAVIORAL SYMPTOMS/MOOD NEUROLOGICAL BOWEL NUTRITION STATUS      Incontinent Diet (see DC summary)  AMBULATORY STATUS COMMUNICATION OF NEEDS Skin   Total Care Non-Verbally PU Stage and Appropriate Care PU Stage 1 Dressing:  (buttocks, foam dressing: lift every shift to assess, change PRN)                     Personal Care Assistance Level of Assistance  Bathing, Feeding, Dressing Bathing Assistance: Maximum assistance Feeding assistance: Maximum assistance Dressing Assistance: Maximum assistance     Functional Limitations Info  Speech     Speech Info: Impaired (incomprehensible)    SPECIAL CARE FACTORS FREQUENCY  PT (By licensed PT), OT (By licensed OT), Speech therapy     PT Frequency: 5xweek OT Frequency: 5xweek     Speech Therapy Frequency: 5xweek      Contractures Contractures Info: Not present    Additional Factors Info  Code Status, Allergies, Insulin Sliding Scale Code Status Info: DNR Allergies Info: Doxycycline, Tape, Ezetimibe, Meclizine, Pravastatin, Colestipol, Sulfamethoxazole, Ace Inhibitors, Codeine,  Colestipol Hcl, Lovastatin, Sulfa Antibiotics   Insulin Sliding Scale Info: 0-9 units every 4 hours       Current Medications (09/12/2020):  This is the current hospital active medication list Current Facility-Administered Medications  Medication Dose Route Frequency Provider Last Rate Last Admin  . acetaminophen (TYLENOL) tablet 650 mg  650 mg Oral Q6H PRN Kipp Brood, MD   650 mg at 09/09/20 1220   Or  . acetaminophen (TYLENOL)  suppository 650 mg  650 mg Rectal Q6H PRN Kipp Brood, MD   650 mg at 09/03/20 1545  . amantadine (SYMMETREL) capsule 100 mg  100 mg Oral BID Donne Hazel, MD   100 mg at 09/12/20 1111  . apixaban (ELIQUIS) tablet 2.5 mg  2.5 mg Oral BID Donne Hazel, MD   2.5 mg at 09/12/20 1112  . chlorhexidine (PERIDEX) 0.12 % solution 15 mL  15 mL Mouth Rinse BID Kipp Brood, MD   15 mL at 09/12/20 1110  . Chlorhexidine Gluconate Cloth 2 % PADS 6 each  6 each Topical Daily Kipp Brood, MD   6 each at 09/12/20 1113  . feeding supplement (ENSURE ENLIVE) (ENSURE ENLIVE) liquid 237 mL  237 mL Oral TID BM Bonnielee Haff, MD   237 mL at 09/12/20 1411  . [START ON 09/13/2020] furosemide (LASIX) injection 40 mg  40 mg Intravenous Daily Bonnielee Haff, MD      . insulin aspart (novoLOG) injection 0-9 Units  0-9 Units Subcutaneous Q4H Kipp Brood, MD   1 Units at 09/11/20 1253  . labetalol (NORMODYNE) injection 10 mg  10 mg Intravenous Q6H PRN Kipp Brood, MD   10 mg at 08/24/20 1848  . levETIRAcetam (KEPPRA) tablet 1,000 mg  1,000 mg Oral BID Donne Hazel, MD   1,000 mg at 09/12/20 1111  . levothyroxine (SYNTHROID) tablet 75 mcg  75 mcg Oral Q0600 Donne Hazel, MD   75 mcg at 09/12/20 [phone]  . LORazepam (ATIVAN) injection 2 mg  2 mg Intravenous PRN Kipp Brood, MD   2 mg at 08/23/20 1544  . MEDLINE mouth rinse  15 mL Mouth Rinse q12n4p Agarwala, Ravi, MD   15 mL at 09/12/20 1200  . metoprolol tartrate (LOPRESSOR) injection 5 mg  5 mg Intravenous Q6H PRN Annita Brod, MD   5 mg at 09/12/20 1119  . metoprolol tartrate (LOPRESSOR) tablet 100 mg  100 mg Oral BID Donne Hazel, MD   100 mg at 09/12/20 1112  . multivitamin with minerals tablet 1 tablet  1 tablet Oral Daily Bonnielee Haff, MD   1 tablet at 09/12/20 1411  . ondansetron (ZOFRAN) tablet 4 mg  4 mg Oral Q6H PRN Kipp Brood, MD       Or  . ondansetron (ZOFRAN) injection 4 mg  4 mg Intravenous Q6H PRN Agarwala, Einar Grad, MD       . pantoprazole (PROTONIX) EC tablet 40 mg  40 mg Oral Daily Donne Hazel, MD   40 mg at 09/12/20 1112  . PHENobarbital (LUMINAL) tablet 64.8 mg  64.8 mg Oral QHS Donne Hazel, MD   64.8 mg at 09/11/20 2225  . sodium chloride flush (NS) 0.9 % injection 10-40 mL  10-40 mL Intracatheter Q12H Elodia Florence., MD   10 mL at 09/12/20 1123  . sodium chloride flush (NS) 0.9 % injection 10-40 mL  10-40 mL Intracatheter PRN Elodia Florence., MD         Discharge Medications: Please  see discharge summary for a list of discharge medications.  Relevant Imaging Results:  Relevant Lab Results:   Additional Information SS#: [id number]  Geralynn Ochs, LCSW

## 2020-09-12 NOTE — Progress Notes (Signed)
PROGRESS NOTE    Lori Bridges  HEN:[id number] DOB: 01/13/1930 DOA: 08/18/2020 PCP: Lori Late, MD    Brief Narrative:  86 year old female past medical history of uncontrolled hypertension with recent cerebral hemorrhage requiring hospitalization, rehabilitation and then discharged home who was brought back to the hospital due to decreased level consciousness as well as right arm shaking. Concern was for acute status epilepticus. Neurology consulted and patient started on IV Keppra, IV Vimpat and phenobarbital. Initially placed in ICU in case she had to be intubated for acute airway protection, but this was not needed. Since then, patient has been able to be transferred out of ICU. Recent MRI noted acute CVA. Patient has remained sedated, felt to be in part due to her medications. Neurology decreased Vimpat on 9/3 in hopes that patient will become more alert however she did not. Neurology stopped her Vimpat altogether on morning of 9/6. Patient looked to be slightly more awake, but then started having jerking arm movements again.  Neurology and palliative care had extensive discussion with patient's daughter. Patient changed to DNR.  Assessment & Plan:   Principal Problem:   Seizure ([place]) Active Problems:   Hyperlipidemia   Chronic diastolic CHF (congestive heart failure) (HCC)   ICH (intracerebral hemorrhage) (HCC) - 2 small L brain ICH, likely d/t HTN on subtherapeutic warfarin   CKD (chronic kidney disease), stage IIIa   Hypothyroidism   PAF (paroxysmal atrial fibrillation) (HCC)   Goals of care, counseling/discussion   Gastroesophageal reflux disease   Seizure disorder ([place])   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[place])   Pressure injury of skin   Palliative care by specialist   Altered mental status   DNR (do not resuscitate)   Adult failure to thrive   Poor fluid intake   Goals of Care:  Overall, Lori Bridges's overall prognosis is poor.   Appreciate assistance by Palliative Care. Discussed case with Palliative. Family no longer seems to have any interest discussing with Palliative Care service and instead wishes to proceed with focusing on rehab and nutrition.   Seizure disorder with active seizure (HCC)/status epilepticus due to complex partial seizure:  Neurology c/s, appreciate recommendations - now signed off Pt obtunded on high dose AED's, continue keppra 1000 mg BID and phenobarb 60 mg daily as well as amantadine 100 mg BID for neurostimulation.  No further AED's as pt obtunded on high doses.  Ativan available prn. Goal is to minimize seizures and allow pt to wake enough to be able to take PO meals S/p solumedrol x3 days PO diet with aspiration precautions Recommend pt to follow with neurology outpatient when discharged  Acute on chronic diastolic CHF (congestive heart failure) ([place]):  Echo from 05/2020 with EF 36-14%, diastolic function could not be evaluated Currently on IV furosemide twice daily.  Has been diuresing well.  Change to oral when ordered intake is consistent.  May need to decrease the dose as her sodium level noted to be high today.  Will change to once a day. CXR - pulmonary vascular congestion and perihilar edema, small effusions Continue with strict I/O, daily weights No LE edema this AM  ICH (intracerebral hemorrhage) (HCC)  Acute Subcentimeter R Cerebellar Infarct - 2 small L brain ICH, likely d/t HTN on subtherapeutic warfarin (now on eliquis, ok per neuro).  Also with acute subcentimeter R cerebellar infarct.  No additional w/u noted per neurology.  CKD (chronic kidney disease), stage IIIa Stable. At baseline.  Hypothyroidism: Continue Synthroid as tolerated  Altered mental status/acute encephalopathy: Felt to be secondary to seizures and antiseizure medication.  Noted to be awake but not very communicative.  Atrial fibrillation with rapid ventricular rate, permanent: Previously on  Coumadin, but has been discontinued since development of intraparenchymal hemorrhage during last hospitalization.  She's now on low dose eliquis per neurology.Follow on metoprolol.   Episodes of tachycardia noted.  Continue to monitor for now.  Acute CVA: MRI noted acute subcentimeter right cerebellar infarct: No further work-up per neurology.    FEN: -Had been dependent on tube feed via cortak. NG tube clogged and was replaced 9/13 -Coretrak later pulled out by pt 9/14 AM -Staff reports pt tolerates PO nutrition if assisted and has thus far tolerated PO meds. Continue to hold off on feeding tube for now.  Watch oral intake.  Encourage and assist as needed.   DVT prophylaxis: Eliquis Code Status: DNR Family Communication:.  No family at bedside Disposition: Plan is for discharge to SNF when stable.  Status is: Inpatient  Remains inpatient appropriate because:Unsafe d/c plan   Dispo:  Patient From:    Planned Disposition: [place]  Expected discharge date: 09/13/20  Medically stable for discharge:    Consultants:   Neurology  Palliative Care  Procedures:  EEG 8/25  Antimicrobials: Anti-infectives (From admission, onward)   None      Subjective: Patient does not answer questions.  She is awake alert.  Objective: Vitals:   09/12/20 0410 09/12/20 0742 09/12/20 0800 09/12/20 1000  BP: 98/75 115/84  112/89  Pulse: 89 92  (!) 119  Resp: 16 19 (!) 23 (!) 22  Temp: 97.8 F (36.6 C) 98.4 F (36.9 C)  98.4 F (36.9 C)  TempSrc: Axillary Oral    SpO2: 97% 100%  99%  Weight:      Height:        Intake/Output Summary (Last 24 hours) at 09/12/2020 1252 Last data filed at 09/12/2020 0830 Gross per 24 hour  Intake 240 ml  Output 1625 ml  Net -1385 ml   Filed Weights   09/09/20 0500 09/10/20 0500 09/11/20 0500  Weight: 83.4 kg 78.8 kg 77.9 kg    Examination:   General appearance: Awake alert.  In no distress Resp: Clear to auscultation  bilaterally.  Normal effort Cardio: S1-S2 is normal regular.  No S3-S4.  No rubs murmurs or bruit GI: Abdomen is soft.  Nontender nondistended.  Bowel sounds are present normal.  No masses organomegaly Neurologic: Patient is awake but not very communicative.  Does not follow commands.      Data Reviewed: I have personally reviewed following labs and imaging studies  CBC: Recent Labs  Lab 09/06/20 0450 09/07/20 1443 09/08/20 0344 09/12/20 0219  WBC 6.8 7.8 7.4 6.2  NEUTROABS 5.7 6.3 5.7  --   HGB 12.0 13.3 13.0 12.3  HCT 39.1 42.0 41.9 39.3  MCV 96.5 95.7 96.3 98.7  PLT PLATELET CLUMPS NOTED ON SMEAR, UNABLE TO ESTIMATE 274 292 712   Basic Metabolic Panel: Recent Labs  Lab 09/06/20 0450 09/07/20 0204 09/08/20 0344 09/12/20 0219  NA 141 142 144 147*  K 5.1 4.2 4.1 3.6  CL 106 103 103 106  CO2 24 31 34* 32  GLUCOSE 95 132* 93 96  BUN 64* 72* 75* 45*  CREATININE 0.85 0.96 1.07* 0.93  CALCIUM 10.5* 10.4* 10.3 10.0  MG 2.7* 2.4 2.5*  --   PHOS 2.3* 2.5 2.6  --    GFR: Estimated Creatinine Clearance: 45  mL/min (by C-G formula based on SCr of 0.93 mg/dL). Liver Function Tests: Recent Labs  Lab 09/06/20 0450 09/07/20 0204 09/08/20 0344 09/12/20 0219  AST 93* 64* 41 41  ALT 76* 71* 57* 46*  ALKPHOS 153* 153* 140* 157*  BILITOT 1.0 0.6 0.7 0.8  PROT 5.7* 5.4* 5.6* 5.7*  ALBUMIN 2.7* 2.8* 2.8* 2.8*   CBG: Recent Labs  Lab 09/11/20 2008 09/12/20 0007 09/12/20 0409 09/12/20 0802 09/12/20 1132  GLUCAP 113* 100* 90 108* 7     Radiology Studies: No results found.  Scheduled Meds: . amantadine  100 mg Oral BID  . apixaban  2.5 mg Oral BID  . chlorhexidine  15 mL Mouth Rinse BID  . Chlorhexidine Gluconate Cloth  6 each Topical Daily  . feeding supplement (ENSURE ENLIVE)  237 mL Oral TID BM  . furosemide  40 mg Intravenous BID  . insulin aspart  0-9 Units Subcutaneous Q4H  . levETIRAcetam  1,000 mg Oral BID  . levothyroxine  75 mcg Oral Q0600  . mouth  rinse  15 mL Mouth Rinse q12n4p  . metoprolol tartrate  100 mg Oral BID  . multivitamin with minerals  1 tablet Oral Daily  . pantoprazole  40 mg Oral Daily  . phenobarbital  64.8 mg Oral QHS  . sodium chloride flush  10-40 mL Intracatheter Q12H   Continuous Infusions:    LOS: 24 days   Bonnielee Haff, MD [facility] Pager On Amion  If 7PM-7AM, please contact night-coverage 09/12/2020, 12:52 PM

## 2020-09-13 LAB — SARS CORONAVIRUS 2 (TAT 6-24 HRS): SARS Coronavirus 2: NEGATIVE

## 2020-09-13 LAB — HEMOGLOBIN AND HEMATOCRIT, BLOOD
HCT: 41.8 % (ref 36.0–46.0)
Hemoglobin: 12.7 g/dL (ref 12.0–15.0)

## 2020-09-13 LAB — BASIC METABOLIC PANEL
Anion gap: 10 (ref 5–15)
BUN: 41 mg/dL — ABNORMAL HIGH (ref 8–23)
CO2: 31 mmol/L (ref 22–32)
Calcium: 10.4 mg/dL — ABNORMAL HIGH (ref 8.9–10.3)
Chloride: 103 mmol/L (ref 98–111)
Creatinine, Ser: 0.99 mg/dL (ref 0.44–1.00)
GFR calc Af Amer: 59 mL/min — ABNORMAL LOW (ref >60)
GFR calc non Af Amer: 51 mL/min — ABNORMAL LOW (ref >60)
Glucose, Bld: 90 mg/dL (ref 70–99)
Potassium: 4.1 mmol/L (ref 3.5–5.1)
Sodium: 144 mmol/L (ref 135–145)

## 2020-09-13 LAB — GLUCOSE, CAPILLARY
Glucose-Capillary: 84 mg/dL (ref 70–99)
Glucose-Capillary: 91 mg/dL (ref 70–99)
Glucose-Capillary: 87 mg/dL (ref 70–99)
Glucose-Capillary: 130 mg/dL — ABNORMAL HIGH (ref 70–99)

## 2020-09-13 MED ORDER — ENSURE ENLIVE PO LIQD: 237.0000 mL | Freq: Three times a day (TID) | ORAL | 12 refills | Status: DC

## 2020-09-13 MED ORDER — ADULT MULTIVITAMIN W/MINERALS CH
1.0000 | ORAL_TABLET | Freq: Every day | ORAL | Status: DC
Start: 2020-09-13 — End: 2020-10-17

## 2020-09-13 MED ORDER — METOPROLOL TARTRATE 100 MG PO TABS: 100.0000 mg | ORAL_TABLET | Freq: Two times a day (BID) | ORAL | Status: DC

## 2020-09-13 MED ORDER — APIXABAN 2.5 MG PO TABS
2.5000 mg | ORAL_TABLET | Freq: Two times a day (BID) | ORAL | Status: DC
Start: 2020-09-13 — End: 2020-10-17

## 2020-09-13 MED ORDER — AMANTADINE HCL 100 MG PO CAPS: 100.0000 mg | ORAL_CAPSULE | Freq: Two times a day (BID) | ORAL | Status: DC

## 2020-09-13 MED ORDER — FUROSEMIDE 40 MG PO TABS: 40.0000 mg | ORAL_TABLET | Freq: Every day | ORAL | Status: DC

## 2020-09-13 MED ORDER — PHENOBARBITAL 64.8 MG PO TABS: 64.8000 mg | ORAL_TABLET | Freq: Every day | ORAL | Status: DC

## 2020-09-13 MED ORDER — LEVETIRACETAM 1000 MG PO TABS: 1000.0000 mg | ORAL_TABLET | Freq: Two times a day (BID) | ORAL | Status: DC

## 2020-09-13 NOTE — Discharge Summary (Addendum)
[facility]  Physician Discharge Summary   Patient ID: Lori Bridges MRN: [id number] DOB/AGE: [birth year] 86 y.o.  Admit date: 08/18/2020 Discharge date: 09/13/2020  PCP: Derinda Late, MD  DISCHARGE DIAGNOSES:  Seizure disorder Chronic diastolic CHF Intracerebral hemorrhage Acute cerebellar infarct Chronic kidney disease stage IIIa Hypothyroidism Permanent atrial fibrillation   RECOMMENDATIONS FOR OUTPATIENT FOLLOW UP: 1. Please do CBC and basic metabolic panel in 3 to 4 days 2. Patient needs outpatient follow-up with neurology.  Ambulatory referral sent to [facility] 3. Follow aspiration precaution 4. Voiding trial in SNF next week    Home Health: Patient going to SNF Equipment/Devices: None  CODE STATUS: DNR  DISCHARGE CONDITION: fair  Diet recommendation: Dysphagia 1 diet with thin liquids.  INITIAL HISTORY: 86 year old female past medical history of uncontrolled hypertension with recent cerebral hemorrhage requiring hospitalization, rehabilitation and then discharged home who was brought back to the hospital due to decreased level consciousness as well as right arm shaking. Concern was for acute status epilepticus. Neurology consulted and patient started on IV Keppra, IV Vimpat and phenobarbital. Initially placed in ICU in case she had to be intubated for acute airway protection, but this was not needed. Since then, patient has been able to be transferred out of ICU. Recent MRI noted acute CVA. Patient has remained sedated, felt to be in part due to her medications. Neurology decreased Vimpat on 9/3 in hopes that patient will become more alert however she did not. Neurology stopped her Vimpat altogether on morning of 9/6. Patient looked to be slightly more awake, but then started having jerking arm movements again.  Neurology and palliative care had extensive discussion with patient's daughter. Patient changed to  DNR.  Consultations:  Neurology  Palliative care  Procedures:  EEG    HOSPITAL COURSE:   Seizure disorder with active seizure (HCC)/status epilepticus due to complex partial seizure: Patient was seen by neurology.  Patient currently on Keppra and phenobarbital.  Also on amantadine for neuro stimulation.  Patient has been seizure-free on this current regimen.  Neurologically she has made very slow progress.  She was also not eating or drinking anything for several days.  Required a feeding tube for a few days.  She pulled this out few days ago but has been able to take her medications and food by mouth since then.  She will need encouragement with oral intake.   Outpatient follow-up with neurology.  Acute on chronic diastolic CHF (congestive heart failure) ([place]): Echo from 05/2020 with EF 50-03%, diastolic function could not be evaluated.  Patient was placed on IV furosemide.  She has diuresed well.  Seems to be stable from a respiratory standpoint.  Change over to oral furosemide at discharge.  ICH (intracerebral hemorrhage) (HCC)  Acute Subcentimeter R Cerebellar Infarct  2 small L brain ICH, likely d/t HTN on subtherapeutic warfarin(now on eliquis, ok per neuro). Also with acute subcentimeter R cerebellar infarct. No additional w/u noted per neurology.  CKD (chronic kidney disease), stage IIIa Stable. At baseline.  Hypothyroidism:  Continue Synthroid as tolerated  Altered mental status/acute encephalopathy: Felt to be secondary to seizures and antiseizure medication.  Noted to be awake but not very communicative.  Atrial fibrillation with rapid ventricular rate, permanent: Previously on Coumadin, but has been discontinued since development of intraparenchymal hemorrhage during last hospitalization. She's now on low dose eliquis per neurology.Follow on metoprolol.  Heart rate stable on current dose of metoprolol.  Occasional tachycardia noted but resolves on  own.  Acute CVA MRI noted acute subcentimeter right cerebellar infarct: No further work-up per neurology.    See above as well.  Acute Urinary Retention/Hematuria Please do voiding trial in [facility]. Noted to have some blood in the foley bag this AM. To be flushed. Will check Hgb prior to discharge. Patient on Eliquis.  Goals of Care:  Overall, Lori Bridges's overall prognosis is poor.  Patient was seen by palliative care..  Changed over to DNR.   Pressure injury Pressure Injury 08/26/20 Buttocks Stage 1 -  Intact skin with non-blanchable redness of a localized area usually over a bony prominence. (Active)  08/26/20 1545  Location: Buttocks  Location Orientation:   Staging: Stage 1 -  Intact skin with non-blanchable redness of a localized area usually over a bony prominence. (prior admission. )  Wound Description (Comments):   Present on Admission:      Overall stable.  Prognosis is guarded.  Okay for discharge to a skilled nursing facility.   PERTINENT LABS:  The results of significant diagnostics from this hospitalization (including imaging, microbiology, ancillary and laboratory) are listed below for reference.    Microbiology: Recent Results (from the past 240 hour(s))  SARS CORONAVIRUS 2 (TAT 6-24 HRS) Nasopharyngeal Nasopharyngeal Swab     Status: None   Collection Time: 09/13/20  1:30 AM   Specimen: Nasopharyngeal Swab  Result Value Ref Range Status   SARS Coronavirus 2 NEGATIVE NEGATIVE Final    Comment: (NOTE) SARS-CoV-2 target nucleic acids are NOT DETECTED.  The SARS-CoV-2 RNA is generally detectable in upper and lower respiratory specimens during the acute phase of infection. Negative results do not preclude SARS-CoV-2 infection, do not rule out co-infections with other pathogens, and should not be used as the sole basis for treatment or other patient management decisions. Negative results must be combined with clinical observations, patient history, and  epidemiological information. The expected result is Negative.  Fact Sheet for Patients: SugarRoll.be  Fact Sheet for Healthcare Providers: https://www.woods-mathews.com/  This test is not yet approved or cleared by the [place] FDA and  has been authorized for detection and/or diagnosis of SARS-CoV-2 by FDA under an Emergency Use Authorization (EUA). This EUA will remain  in effect (meaning this test can be used) for the duration of the COVID-19 declaration under Se ction 564(b)(1) of the Act, 21 U.S.C. section 360bbb-3(b)(1), unless the authorization is terminated or revoked sooner.  Performed at [facility], [place] [address] [place], [place] [address]      Labs:     Basic Metabolic Panel: Recent Labs  Lab 09/07/20 0204 09/08/20 0344 09/12/20 0219 09/13/20 0833  NA 142 144 147* 144  K 4.2 4.1 3.6 4.1  CL 103 103 106 103  CO2 31 34* 32 31  GLUCOSE 132* 93 96 90  BUN 72* 75* 45* 41*  CREATININE 0.96 1.07* 0.93 0.99  CALCIUM 10.4* 10.3 10.0 10.4*  MG 2.4 2.5*  --   --   PHOS 2.5 2.6  --   --    Liver Function Tests: Recent Labs  Lab 09/07/20 0204 09/08/20 0344 09/12/20 0219  AST 64* 41 41  ALT 71* 57* 46*  ALKPHOS 153* 140* 157*  BILITOT 0.6 0.7 0.8  PROT 5.4* 5.6* 5.7*  ALBUMIN 2.8* 2.8* 2.8*   CBC: Recent Labs  Lab 09/07/20 1443 09/08/20 0344 09/12/20 0219  WBC 7.8 7.4 6.2  NEUTROABS 6.3 5.7  --   HGB 13.3 13.0 12.3  HCT 42.0 41.9 39.3  MCV  95.7 96.3 98.7  PLT 274 292 259   BNP: BNP (last 3 results) Recent Labs    09/05/20 1020 09/06/20 1341  BNP 766.9* 760.9*    CBG: Recent Labs  Lab 09/12/20 1521 09/12/20 1940 09/13/20 0109 09/13/20 0324 09/13/20 0807  GLUCAP 116* 131* 84 91 87     IMAGING STUDIES EEG  Result Date: 08/20/2020 Lora Havens, MD     08/20/2020 12:19 PM Patient Name: ALENE BERGERSON MRN: [id number] Epilepsy Attending: Lora Havens Referring  Physician/Provider: Dr. Zeb Comfort Date: 08/20/1020 Duration: 23.30 minutes Patient history: 86 year old female with history of seizures presented with breakthrough seizures and status epilepticus.  EEG to assess for seizures. Level of alertness: Awake AEDs during EEG study: Keppra Technical aspects: This EEG study was done with scalp electrodes positioned according to the 10-20 International system of electrode placement. Electrical activity was acquired at a sampling rate of 500Hz  and reviewed with a high frequency filter of 70Hz  and a low frequency filter of 1Hz . EEG data were recorded continuously and digitally stored. Description: No definite posterior dominant rhythm was seen.  EEG showed continuous generalized and maximal left parieto-occipital region 3 to 5 Hz theta-delta slowing.  Periodic lateralized epileptiform discharges at 1 Hz, at times rhythmic were also noted in left hemisphere, maximal left parieto-occipital region.  Hyperventilation and photic stimulation were not performed.   ABNORMALITY -Periodic lateralized epileptiform discharges, left hemisphere -Continuous slow, generalized and maximal left parieto-occipital region IMPRESSION: This study evidence of epileptogenicity as well as cortical dysfunction in left hemisphere, maximal left parieto-occipital region secondary to underlying structural abnormality. No seizures were seen throughout the recording. Lora Havens   DG Chest 1 View  Result Date: 09/04/2020 CLINICAL DATA:  Shortness of breath at rest. EXAM: CHEST  1 VIEW COMPARISON:  Single-view of the chest 08/26/2020. FINDINGS: Right pleural effusion and basilar atelectasis have improved. No left effusion is seen in the left lung appears clear. There is cardiomegaly. Aortic atherosclerosis is seen. No pneumothorax. Feeding tube courses into the stomach and below the inferior margin the film. IMPRESSION: Decreased right pleural effusion and basilar atelectasis since the prior  examination. No new abnormality. Cardiomegaly without edema. Electronically Signed   By: Inge Rise M.D.   On: 09/04/2020 16:42   CT HEAD WO CONTRAST  Result Date: 08/22/2020 CLINICAL DATA:  Neuro deficit, acute, stroke suspected Involuntary right-sided movement. EXAM: CT HEAD WITHOUT CONTRAST TECHNIQUE: Contiguous axial images were obtained from the base of the skull through the vertex without intravenous contrast. COMPARISON:  Multiple prior exams. Most recent head CT 3 days ago. Brain MRI 07/22/2020 FINDINGS: Brain: Mild motion artifact limitations. Similar-appearing low-density in the left parietal and occipital lobes at site of prior hemorrhage. No interval change from recent prior. No residual blood products. No new or acute hemorrhage. No evidence of acute ischemia. Stable degree of chronic small vessel ischemia. No hydrocephalus. No midline shift. No extra-axial or subdural collection. Vascular: No hyperdense vessel. Skull base atherosclerosis is partially obscured by motion. Skull: No fracture or focal lesion. Sinuses/Orbits: No acute findings. Other: EEG leads overlie the scalp. IMPRESSION: 1. Stable head CT over the last 3 days. 2. Unchanged low-density in the left parietal and occipital lobes at site of prior hemorrhage. No residual blood products. 3. Stable degree of chronic small vessel ischemia. Electronically Signed   By: Keith Rake M.D.   On: 08/22/2020 20:57   CT Head Wo Contrast  Result Date: 08/19/2020 CLINICAL DATA:  86 year old female  with seizure. History of prior intracranial hemorrhage. EXAM: CT HEAD WITHOUT CONTRAST TECHNIQUE: Contiguous axial images were obtained from the base of the skull through the vertex without intravenous contrast. COMPARISON:  08/14/2020 CT and prior studies FINDINGS: Brain: Resolution of LEFT parietal and temporal intracranial hemorrhage noted. High LEFT frontoparietal hypodensity is again noted probably related to edema from recent intracranial  hemorrhage. No new or persistent hemorrhage is identified intracranially. No acute infarct, midline shift or hydrocephalus. Mild atrophy and chronic small-vessel white matter ischemic changes again noted. Vascular: Carotid and vertebral atherosclerotic calcifications are noted. Skull: Normal. Negative for fracture or focal lesion. Sinuses/Orbits: No acute finding. Other: None. IMPRESSION: 1. Resolution of LEFT parietal and LEFT temporal hemorrhage. No evidence of new or acute intracranial abnormality. 2. High LEFT frontoparietal hypodensity likely related to edema from recent intracranial hemorrhage. 3. Mild atrophy and chronic small-vessel white matter ischemic changes. Electronically Signed   By: Margarette Canada M.D.   On: 08/19/2020 12:26   MR BRAIN WO CONTRAST  Result Date: 08/28/2020 CLINICAL DATA:  Seizure.  History of ICH last month. EXAM: MRI HEAD WITHOUT CONTRAST TECHNIQUE: Multiplanar, multiecho pulse sequences of the brain and surrounding structures were obtained without intravenous contrast. COMPARISON:  Head CT 08/22/2020 and MRI 07/22/2020 FINDINGS: The study is moderately motion degraded. Brain: There is a 2.2 x 1.4 cm parenchymal hematoma in the medial left parietal lobe which is T1 and T2 hyperintense and has slightly decreased in size from the prior MRI with mild residual surrounding edema and no significant mass effect. Mild surrounding gyral T1 hyperintensity suggests laminar necrosis. There is faint cortically based trace diffusion weighted signal hyperintensity both superior and inferior to the hematoma including in the left postcentral gyrus. A small parenchymal hemorrhage in the inferolateral left temporal lobe has also decreased in size and now measures 8 mm without residual edema. No significant residual extra-axial hemorrhage is evident. There is a subcentimeter acute infarct in the right cerebellum. There is mild cerebral atrophy. Periventricular white matter T2 hyperintensities are  similar to the prior MRI and are nonspecific but compatible with minimal chronic small vessel ischemic disease. There is no midline shift or extra-axial fluid collection. Vascular: Major intracranial vascular flow voids are grossly preserved. Skull and upper cervical spine: Unremarkable bone marrow signal. Sinuses/Orbits: Unremarkable orbits. Paranasal sinuses and mastoid air cells are clear. Other: None. IMPRESSION: 1. Acute subcentimeter right cerebellar infarct. 2. Decreased size of left parietal and left temporal lobe hemorrhages with mild residual edema at the former. Mild surrounding cortical signal abnormality in the left parietal lobe may reflect an associated subacute infarct and possibly recent seizure activity. Electronically Signed   By: Logan Bores M.D.   On: 08/28/2020 11:11   DG CHEST PORT 1 VIEW  Result Date: 09/06/2020 CLINICAL DATA:  Heart failure EXAM: PORTABLE CHEST 1 VIEW COMPARISON:  09/04/2020 FINDINGS: Cardiac enlargement with vascular congestion. Perihilar infiltrates likely edema. Small pleural effusions. An enteric tube is present with tip off the field of view but below the left hemidiaphragm. Calcified aorta. IMPRESSION: Cardiac enlargement with pulmonary vascular congestion and perihilar edema. Small pleural effusions. Electronically Signed   By: Lucienne Capers M.D.   On: 09/06/2020 06:40   DG CHEST PORT 1 VIEW  Result Date: 08/26/2020 CLINICAL DATA:  Respiratory failure EXAM: PORTABLE CHEST 1 VIEW COMPARISON:  August 25, 2020 FINDINGS: Feeding tube terminates below today's film. Stable cardiomegaly. The hila and mediastinum are unremarkable. No pneumothorax. Layering effusion underlying opacity on the right is similar  to slightly more pronounced. Left retrocardiac opacity may be minimally improved. No other changes. IMPRESSION: 1. Support apparatus as above. 2. Right pleural effusion with underlying opacity, possibly atelectasis, is slightly more pronounced in the interval.  3. Left retrocardiac opacity is not well assessed without a lateral view but there may be slightly improved aeration of the left base. Electronically Signed   By: Dorise Bullion III M.D   On: 08/26/2020 15:10   DG Chest Port 1 View  Result Date: 08/25/2020 CLINICAL DATA:  Cardiomegaly versus pericardial effusion. EXAM: PORTABLE CHEST 1 VIEW COMPARISON:  August 19, 2020 FINDINGS: The cardiac silhouette remains enlarged and is stable. Layering effusion on the right. Focal opacity in left retrocardiac region, similar in the interval. Feeding tube terminates blows today's film. No pneumothorax. No other acute abnormalities. IMPRESSION: 1. Stable enlargement of the cardiac silhouette may represent cardiomegaly or pericardial effusion. 2. Layering effusion on the right with underlying atelectasis. 3. Dense opacity in left retrocardiac region remains. 4. 2 linear densities project over the left side of the mediastinum, possibly something on the patient. This is a new finding in the interval. Electronically Signed   By: Dorise Bullion III M.D   On: 08/25/2020 15:05   DG CHEST PORT 1 VIEW  Result Date: 08/19/2020 CLINICAL DATA:  Pneumonia EXAM: PORTABLE CHEST 1 VIEW COMPARISON:  Prior chest x-ray 09/17/2018 FINDINGS: Marked interval enlargement of the cardiopericardial silhouette which is ill-defined and partially obscured by opacities in left greater than right lung base. The mediastinal contours appear similar. Atherosclerotic calcifications are present in the transverse aorta. Increased pulmonary vascular congestion with diffuse mild interstitial prominence suggesting interstitial edema. Bibasilar airspace opacities are present obscuring the hemidiaphragms. Findings are favored to reflect a combination of pleural fluid and atelectasis. No pneumothorax. No acute osseous abnormality. IMPRESSION: 1. Interval enlargement of the cardiopericardial silhouette which may represent worsening cardiomegaly versus  pericardial effusion. 2. Pulmonary vascular congestion and mild edema concerning for CHF. 3. Symmetric bibasilar airspace opacities obscuring the diaphragms are favored to reflect a combination of layering pleural effusions with atelectasis. Recommend dedicated PA and lateral chest x-ray to confirm. Electronically Signed   By: Jacqulynn Cadet M.D.   On: 08/19/2020 14:21   Overnight EEG with video  Result Date: 08/22/2020 Lora Havens, MD     08/23/2020 11:23 AM Patient Name: ARACELYS GLADE MRN: [id number] Epilepsy Attending: Lora Havens Referring Physician/Provider: Dr. Zeb Comfort Duration: 08/21/2020 1525 to 08/22/2020 1525  Patient history: 86 year old female with history of seizures presented with breakthrough seizures and status epilepticus.  EEG to assess for seizures.  Level of alertness: Awake, asleep  AEDs during EEG study: Keppra  Technical aspects: This EEG study was done with scalp electrodes positioned according to the 10-20 International system of electrode placement. Electrical activity was acquired at a sampling rate of 500Hz  and reviewed with a high frequency filter of 70Hz  and a low frequency filter of 1Hz . EEG data were recorded continuously and digitally stored.  Description: No definite posterior dominant rhythm was seen.  EEG showed continuous generalized and maximal left parieto-occipital region 3 to 5 Hz theta-delta slowing.  Periodic lateralized epileptiform discharges (PLEDs) with overriding fast activity at 1 Hz, at times rhythmic were also noted in left hemisphere, maximal left parieto-occipital region. Hyperventilation and photic stimulation were not performed.   Event button was pressed on 8/25/121 at 0511 and at 1433 for right upper extremity shaking. Concomitant EEG showed rhythmic PLEds in left hemisphere, maximal left parieto-occipital  region consistent with focal motor seizure. ABNORMALITY -Focal motor seizure, left hemisphere -PLEds with overriding fast  activity, left hemisphere -Continuous slow, generalized and maximal left parieto-occipital region  IMPRESSION: This study showed focal motor seizure arising from left hemisphere. PLEDs with overriding fast activity were also noted which is on the ictal-interictal continuum.  Additionally, there is evidence of cortical dysfunction in left hemisphere, maximal left parieto-occipital region secondary to underlying structural abnormality.  Priyanka O Yadav    DISCHARGE EXAMINATION: Vitals:   09/12/20 2002 09/12/20 2346 09/13/20 0325 09/13/20 0803  BP: 109/73 105/75 104/83 112/78  Pulse: (!) 108 95 94 (!) 101  Resp: (!) 23 (!) 21 18 19   Temp: 98.5 F (36.9 C) 98.1 F (36.7 C) (!) 97.5 F (36.4 C) 97.6 F (36.4 C)  TempSrc: Oral Oral Axillary Oral  SpO2: 100% 99% 98% 97%  Weight:      Height:       General appearance: Awake alert.  In no distress.  Distracted. following commands better today than yesterday. Resp: Clear to auscultation bilaterally.  Normal effort Cardio: S1-S2 is normal regular.  No S3-S4.  No rubs murmurs or bruit GI: Abdomen is soft.  Nontender nondistended.  Bowel sounds are present normal.  No masses organomegaly Extremities: No edema.   Neurologic:   No focal neurological deficits.    DISPOSITION: SNF  Discharge Instructions    Ambulatory referral to Neurology   Complete by: As directed    An appointment is requested in approximately: 8 weeks for seizures   Call MD for:  difficulty breathing, headache or visual disturbances   Complete by: As directed    Call MD for:  extreme fatigue   Complete by: As directed    Call MD for:  persistant dizziness or light-headedness   Complete by: As directed    Call MD for:  persistant nausea and vomiting   Complete by: As directed    Call MD for:  severe uncontrolled pain   Complete by: As directed    Call MD for:  temperature >100.4   Complete by: As directed    Diet - low sodium heart healthy   Complete by: As  directed    Discharge instructions   Complete by: As directed    Please review instructions on the discharge summary.  You were cared for by a hospitalist during your hospital stay. If you have any questions about your discharge medications or the care you received while you were in the hospital after you are discharged, you can call the unit and asked to speak with the hospitalist on call if the hospitalist that took care of you is not available. Once you are discharged, your primary care physician will handle any further medical issues. Please note that NO REFILLS for any discharge medications will be authorized once you are discharged, as it is imperative that you return to your primary care physician (or establish a relationship with a primary care physician if you do not have one) for your aftercare needs so that they can reassess your need for medications and monitor your lab values. If you do not have a primary care physician, you can call [phone] for a physician referral.   Increase activity slowly   Complete by: As directed    No wound care   Complete by: As directed         Allergies as of 09/13/2020      Reactions   Doxycycline Other (See Comments)   Reaction  not recalled, but not tolerated   Tape Other (See Comments)   SKIN IS VERY THIN- WILL TEAR EASILY!!   Ezetimibe Other (See Comments)   Arthralgia, Myalgias   Meclizine Other (See Comments), Palpitations   Tachycardia   Pravastatin Other (See Comments)   Arthralgia, Myalgias   Colestipol Other (See Comments)   Malaise   Sulfamethoxazole Other (See Comments)   Reaction not recalled, but not tolerated   Ace Inhibitors Cough   Codeine Nausea Only   Colestipol Hcl Other (See Comments)   Malaise   Lovastatin Other (See Comments)   Reaction not recalled- possibly muscle pain   Sulfa Antibiotics Other (See Comments)   Reaction not recalled, but not tolerated      Medication List    STOP taking these medications     Melatonin 300 MCG Tabs     TAKE these medications   acetaminophen 325 MG tablet Commonly known as: TYLENOL Take 2 tablets (650 mg total) by mouth every 4 (four) hours as needed for mild pain (or temp > 37.5 C (99.5 F)).   amantadine 100 MG capsule Commonly known as: SYMMETREL Take 1 capsule (100 mg total) by mouth 2 (two) times daily.   apixaban 2.5 MG Tabs tablet Commonly known as: ELIQUIS Take 1 tablet (2.5 mg total) by mouth 2 (two) times daily.   feeding supplement (ENSURE ENLIVE) Liqd Take 237 mLs by mouth 3 (three) times daily between meals.   furosemide 40 MG tablet Commonly known as: LASIX Take 1 tablet (40 mg total) by mouth daily. What changed:   medication strength  how much to take  when to take this   levETIRAcetam 1000 MG tablet Commonly known as: KEPPRA Take 1 tablet (1,000 mg total) by mouth 2 (two) times daily. What changed:   medication strength  how much to take   metoprolol tartrate 100 MG tablet Commonly known as: LOPRESSOR Take 1 tablet (100 mg total) by mouth 2 (two) times daily. What changed:   medication strength  how much to take   multivitamin with minerals Tabs tablet Take 1 tablet by mouth daily.   pantoprazole 40 MG tablet Commonly known as: Protonix Take 1 tablet (40 mg total) by mouth daily. What changed:   when to take this  reasons to take this   PHENobarbital 64.8 MG tablet Commonly known as: LUMINAL Take 1 tablet (64.8 mg total) by mouth at bedtime.   Synthroid 50 MCG tablet Generic drug: levothyroxine Take 50 mcg by mouth daily before breakfast. What changed: Another medication with the same name was removed. Continue taking this medication, and follow the directions you see here.         Contact information for follow-up providers    Derinda Late, MD. Schedule an appointment as soon as possible for a visit in 3 week(s).   Specialty: Family Medicine Contact information: [place] [address] [phone]            Contact information for after-discharge care    Destination    [facility] .   Service: Skilled Nursing Contact information: [place]                  TOTAL DISCHARGE TIME: 70 minutes  [place]  [facility] Pager on [email or web address]  09/13/2020, 11:29 AM

## 2020-09-13 NOTE — Progress Notes (Signed)
Pt transported to [facility]. Telephone report given to nurse [place] prior to discharge. Daughter at bedside during transfer

## 2020-09-13 NOTE — Care Management Important Message (Signed)
Important Message  Patient Details  Name: Lori Bridges MRN: [id number] Date of Birth: Feb 08, 1930   Medicare Important Message Given:  Yes - Important Message mailed due to current National Emergency  Verbal consent obtained due to current National Emergency  Relationship to patient: Child Contact Name: Ms Tyrone Nine Call Date: 09/13/20  Time: 1308 Phone: [phone] Outcome: Spoke with contact Important Message mailed to: Patient address on file    [place] 09/13/2020, 1:08 PM

## 2020-09-13 NOTE — TOC Transition Note (Signed)
Transition of Care [facility]) - CM/SW Discharge Note   Patient Details  Name: MALEY VENEZIA MRN: [id number] Date of Birth: [birth year]  Transition of Care [facility] CM/SW Contact:  Coralee Pesa, [place] Phone Number: 09/13/2020, 11:44 AM   Clinical Narrative:    Nurse to call report to [phone]. Rm # [phone]   Final next level of care: Skilled Nursing Facility Barriers to Discharge: Barriers Resolved   Patient Goals and CMS Choice Patient states their goals for this hospitalization and ongoing recovery are:: patient unable to participate in goal setting due to disorientation CMS Medicare.gov Compare Post Acute Care list provided to:: Patient Represenative (must comment) Choice offered to / list presented to : Adult Children  Discharge Placement              Patient [facility] bed at: [facility] Patient to be transferred to facility by: [facility] Name of family member notified: [name] Patient and family notified of of transfer: 09/13/20  Discharge Plan and Services     Post Acute Care Choice: [place]                               Social Determinants of Health (SDOH) Interventions     Readmission Risk Interventions No flowsheet data found.

## 2020-10-08 ENCOUNTER — Other Ambulatory Visit: Payer: Self-pay | Admitting: Internal Medicine

## 2020-10-08 DIAGNOSIS — N1832 Chronic kidney disease, stage 3b: Secondary | ICD-10-CM

## 2020-10-11 ENCOUNTER — Inpatient Hospital Stay: Payer: Medicare Other | Admitting: Adult Health

## 2020-10-12 ENCOUNTER — Emergency Department (HOSPITAL_COMMUNITY): Payer: Medicare Other

## 2020-10-12 ENCOUNTER — Encounter (HOSPITAL_COMMUNITY): Payer: Self-pay | Admitting: Internal Medicine

## 2020-10-12 ENCOUNTER — Other Ambulatory Visit: Payer: Self-pay

## 2020-10-12 ENCOUNTER — Inpatient Hospital Stay (HOSPITAL_COMMUNITY)
Admission: EM | Admit: 2020-10-12 | Discharge: 2020-10-17 | DRG: 951 | Disposition: A | Discharge status: Hospice | Payer: Medicare Other | Attending: Internal Medicine | Admitting: Internal Medicine

## 2020-10-12 DIAGNOSIS — T68XXXA Hypothermia, initial encounter: Secondary | ICD-10-CM

## 2020-10-12 DIAGNOSIS — R680 Hypothermia, not associated with low environmental temperature: Secondary | ICD-10-CM | POA: Diagnosis present

## 2020-10-12 DIAGNOSIS — Z8249 Family history of ischemic heart disease and other diseases of the circulatory system: Secondary | ICD-10-CM

## 2020-10-12 DIAGNOSIS — Z20822 Contact with and (suspected) exposure to covid-19: Secondary | ICD-10-CM | POA: Diagnosis present

## 2020-10-12 DIAGNOSIS — E039 Hypothyroidism, unspecified: Secondary | ICD-10-CM | POA: Diagnosis present

## 2020-10-12 DIAGNOSIS — Z8679 Personal history of other diseases of the circulatory system: Secondary | ICD-10-CM

## 2020-10-12 DIAGNOSIS — N1831 Chronic kidney disease, stage 3a: Secondary | ICD-10-CM | POA: Diagnosis present

## 2020-10-12 DIAGNOSIS — Z881 Allergy status to other antibiotic agents status: Secondary | ICD-10-CM

## 2020-10-12 DIAGNOSIS — R7989 Other specified abnormal findings of blood chemistry: Secondary | ICD-10-CM | POA: Diagnosis present

## 2020-10-12 DIAGNOSIS — R0902 Hypoxemia: Secondary | ICD-10-CM | POA: Diagnosis present

## 2020-10-12 DIAGNOSIS — I272 Pulmonary hypertension, unspecified: Secondary | ICD-10-CM | POA: Diagnosis present

## 2020-10-12 DIAGNOSIS — M810 Age-related osteoporosis without current pathological fracture: Secondary | ICD-10-CM | POA: Diagnosis present

## 2020-10-12 DIAGNOSIS — E872 Acidosis: Secondary | ICD-10-CM | POA: Diagnosis present

## 2020-10-12 DIAGNOSIS — I619 Nontraumatic intracerebral hemorrhage, unspecified: Secondary | ICD-10-CM | POA: Diagnosis present

## 2020-10-12 DIAGNOSIS — R000 Tachycardia, unspecified: Secondary | ICD-10-CM | POA: Diagnosis not present

## 2020-10-12 DIAGNOSIS — Z885 Allergy status to narcotic agent status: Secondary | ICD-10-CM

## 2020-10-12 DIAGNOSIS — Z86010 Personal history of colonic polyps: Secondary | ICD-10-CM

## 2020-10-12 DIAGNOSIS — R69 Illness, unspecified: Secondary | ICD-10-CM

## 2020-10-12 DIAGNOSIS — F419 Anxiety disorder, unspecified: Secondary | ICD-10-CM | POA: Diagnosis present

## 2020-10-12 DIAGNOSIS — I50813 Acute on chronic right heart failure: Secondary | ICD-10-CM | POA: Diagnosis present

## 2020-10-12 DIAGNOSIS — Z9109 Other allergy status, other than to drugs and biological substances: Secondary | ICD-10-CM

## 2020-10-12 DIAGNOSIS — J9601 Acute respiratory failure with hypoxia: Secondary | ICD-10-CM

## 2020-10-12 DIAGNOSIS — I130 Hypertensive heart and chronic kidney disease with heart failure and stage 1 through stage 4 chronic kidney disease, or unspecified chronic kidney disease: Secondary | ICD-10-CM | POA: Diagnosis present

## 2020-10-12 DIAGNOSIS — M1990 Unspecified osteoarthritis, unspecified site: Secondary | ICD-10-CM | POA: Diagnosis present

## 2020-10-12 DIAGNOSIS — I071 Rheumatic tricuspid insufficiency: Secondary | ICD-10-CM | POA: Diagnosis present

## 2020-10-12 DIAGNOSIS — I959 Hypotension, unspecified: Secondary | ICD-10-CM | POA: Diagnosis present

## 2020-10-12 DIAGNOSIS — R0602 Shortness of breath: Secondary | ICD-10-CM | POA: Diagnosis present

## 2020-10-12 DIAGNOSIS — G40909 Epilepsy, unspecified, not intractable, without status epilepticus: Secondary | ICD-10-CM | POA: Diagnosis present

## 2020-10-12 DIAGNOSIS — Z882 Allergy status to sulfonamides status: Secondary | ICD-10-CM

## 2020-10-12 DIAGNOSIS — K5900 Constipation, unspecified: Secondary | ICD-10-CM | POA: Diagnosis present

## 2020-10-12 DIAGNOSIS — Z79899 Other long term (current) drug therapy: Secondary | ICD-10-CM

## 2020-10-12 DIAGNOSIS — I610 Nontraumatic intracerebral hemorrhage in hemisphere, subcortical: Secondary | ICD-10-CM | POA: Diagnosis not present

## 2020-10-12 DIAGNOSIS — Z888 Allergy status to other drugs, medicaments and biological substances status: Secondary | ICD-10-CM

## 2020-10-12 DIAGNOSIS — E785 Hyperlipidemia, unspecified: Secondary | ICD-10-CM | POA: Diagnosis present

## 2020-10-12 DIAGNOSIS — Z515 Encounter for palliative care: Principal | ICD-10-CM

## 2020-10-12 DIAGNOSIS — Z66 Do not resuscitate: Secondary | ICD-10-CM | POA: Diagnosis present

## 2020-10-12 DIAGNOSIS — K219 Gastro-esophageal reflux disease without esophagitis: Secondary | ICD-10-CM | POA: Diagnosis present

## 2020-10-12 DIAGNOSIS — N39490 Overflow incontinence: Secondary | ICD-10-CM | POA: Diagnosis present

## 2020-10-12 DIAGNOSIS — Z79890 Hormone replacement therapy (postmenopausal): Secondary | ICD-10-CM

## 2020-10-12 DIAGNOSIS — Z7901 Long term (current) use of anticoagulants: Secondary | ICD-10-CM

## 2020-10-12 DIAGNOSIS — K5641 Fecal impaction: Secondary | ICD-10-CM

## 2020-10-12 DIAGNOSIS — I4821 Permanent atrial fibrillation: Secondary | ICD-10-CM | POA: Diagnosis present

## 2020-10-12 DIAGNOSIS — Z8701 Personal history of pneumonia (recurrent): Secondary | ICD-10-CM

## 2020-10-12 DIAGNOSIS — R569 Unspecified convulsions: Secondary | ICD-10-CM

## 2020-10-12 DIAGNOSIS — I69391 Dysphagia following cerebral infarction: Secondary | ICD-10-CM

## 2020-10-12 LAB — CBC WITH DIFFERENTIAL/PLATELET
Abs Immature Granulocytes: 0.04 10*3/uL (ref 0.00–0.07)
Basophils Absolute: 0 10*3/uL (ref 0.0–0.1)
Basophils Relative: 0 %
Eosinophils Absolute: 0 10*3/uL (ref 0.0–0.5)
Eosinophils Relative: 0 %
HCT: 42.4 % (ref 36.0–46.0)
Hemoglobin: 13.4 g/dL (ref 12.0–15.0)
Immature Granulocytes: 1 %
Lymphocytes Relative: 10 %
Lymphs Abs: 0.8 10*3/uL (ref 0.7–4.0)
MCH: 30.3 pg (ref 26.0–34.0)
MCHC: 31.6 g/dL (ref 30.0–36.0)
MCV: 95.9 fL (ref 80.0–100.0)
Monocytes Absolute: 0.9 10*3/uL (ref 0.1–1.0)
Monocytes Relative: 12 %
Neutro Abs: 5.9 10*3/uL (ref 1.7–7.7)
Neutrophils Relative %: 77 %
Platelets: 166 10*3/uL (ref 150–400)
RBC: 4.42 MIL/uL (ref 3.87–5.11)
RDW: 17.1 % — ABNORMAL HIGH (ref 11.5–15.5)
WBC: 7.7 10*3/uL (ref 4.0–10.5)
nRBC: 0.7 % — ABNORMAL HIGH (ref 0.0–0.2)

## 2020-10-12 LAB — COMPREHENSIVE METABOLIC PANEL
ALT: 26 U/L (ref 0–44)
AST: 42 U/L — ABNORMAL HIGH (ref 15–41)
Albumin: 3.5 g/dL (ref 3.5–5.0)
Alkaline Phosphatase: 173 U/L — ABNORMAL HIGH (ref 38–126)
Anion gap: 17 — ABNORMAL HIGH (ref 5–15)
BUN: 35 mg/dL — ABNORMAL HIGH (ref 8–23)
CO2: 21 mmol/L — ABNORMAL LOW (ref 22–32)
Calcium: 10.9 mg/dL — ABNORMAL HIGH (ref 8.9–10.3)
Chloride: 99 mmol/L (ref 98–111)
Creatinine, Ser: 1.6 mg/dL — ABNORMAL HIGH (ref 0.44–1.00)
GFR, Estimated: 28 mL/min — ABNORMAL LOW (ref >60)
Glucose, Bld: 122 mg/dL — ABNORMAL HIGH (ref 70–99)
Potassium: 4.6 mmol/L (ref 3.5–5.1)
Sodium: 137 mmol/L (ref 135–145)
Total Bilirubin: 2.9 mg/dL — ABNORMAL HIGH (ref 0.3–1.2)
Total Protein: 6.5 g/dL (ref 6.5–8.1)

## 2020-10-12 LAB — URINALYSIS, ROUTINE W REFLEX MICROSCOPIC
Bilirubin Urine: NEGATIVE
Glucose, UA: NEGATIVE mg/dL
Ketones, ur: NEGATIVE mg/dL
Nitrite: NEGATIVE
Protein, ur: 100 mg/dL — AB
RBC / HPF: >50 RBC/hpf — ABNORMAL HIGH (ref 0–5)
Specific Gravity, Urine: 1.012 (ref 1.005–1.030)
WBC, UA: >50 WBC/hpf — ABNORMAL HIGH (ref 0–5)
pH: 7 (ref 5.0–8.0)

## 2020-10-12 LAB — I-STAT ARTERIAL BLOOD GAS, ED
Acid-base deficit: 1 mmol/L (ref 0.0–2.0)
Bicarbonate: 21.8 mmol/L (ref 20.0–28.0)
Calcium, Ion: 1.34 mmol/L (ref 1.15–1.40)
HCT: 44 % (ref 36.0–46.0)
Hemoglobin: 15 g/dL (ref 12.0–15.0)
O2 Saturation: 98 %
Patient temperature: 94.4
Potassium: 4.3 mmol/L (ref 3.5–5.1)
Sodium: 139 mmol/L (ref 135–145)
TCO2: 23 mmol/L (ref 22–32)
pCO2 arterial: 28.4 mmHg — ABNORMAL LOW (ref 32.0–48.0)
pH, Arterial: 7.483 — ABNORMAL HIGH (ref 7.350–7.450)
pO2, Arterial: 88 mmHg (ref 83.0–108.0)

## 2020-10-12 LAB — MAGNESIUM: Magnesium: 2.2 mg/dL (ref 1.7–2.4)

## 2020-10-12 LAB — RESPIRATORY PANEL BY RT PCR (FLU A&B, COVID)
Influenza A by PCR: NEGATIVE
Influenza B by PCR: NEGATIVE
SARS Coronavirus 2 by RT PCR: NEGATIVE

## 2020-10-12 LAB — CK: Total CK: 32 U/L — ABNORMAL LOW (ref 38–234)

## 2020-10-12 LAB — PHOSPHORUS: Phosphorus: 3.9 mg/dL (ref 2.5–4.6)

## 2020-10-12 LAB — PROTIME-INR
INR: 2.4 — ABNORMAL HIGH (ref 0.8–1.2)
Prothrombin Time: 25.5 seconds — ABNORMAL HIGH (ref 11.4–15.2)

## 2020-10-12 LAB — LACTIC ACID, PLASMA
Lactic Acid, Venous: 5 mmol/L (ref 0.5–1.9)
Lactic Acid, Venous: 4.6 mmol/L (ref 0.5–1.9)

## 2020-10-12 LAB — APTT: aPTT: 41 seconds — ABNORMAL HIGH (ref 24–36)

## 2020-10-12 LAB — BRAIN NATRIURETIC PEPTIDE: B Natriuretic Peptide: 785.6 pg/mL — ABNORMAL HIGH (ref 0.0–100.0)

## 2020-10-12 LAB — CBG MONITORING, ED: Glucose-Capillary: 122 mg/dL — ABNORMAL HIGH (ref 70–99)

## 2020-10-12 LAB — TROPONIN I (HIGH SENSITIVITY): Troponin I (High Sensitivity): 101 ng/L (ref <18)

## 2020-10-12 MED ORDER — MORPHINE SULFATE (PF) 2 MG/ML IV SOLN: 1.0000 mg | INTRAVENOUS | Status: DC | PRN

## 2020-10-12 MED ORDER — ACETAMINOPHEN 325 MG PO TABS: 650.0000 mg | ORAL_TABLET | Freq: Four times a day (QID) | ORAL | Status: DC | PRN

## 2020-10-12 MED ORDER — GLYCOPYRROLATE 1 MG PO TABS
1.0000 mg | ORAL_TABLET | ORAL | Status: DC | PRN
  Filled 2020-10-12: qty 1

## 2020-10-12 MED ORDER — ONDANSETRON 4 MG PO TBDP
4.0000 mg | ORAL_TABLET | Freq: Four times a day (QID) | ORAL | Status: DC | PRN
  Filled 2020-10-12: qty 1

## 2020-10-12 MED ORDER — FUROSEMIDE 10 MG/ML IJ SOLN: 40.0000 mg | Freq: Once | INTRAMUSCULAR | Status: DC

## 2020-10-12 MED ORDER — LORAZEPAM 2 MG/ML IJ SOLN
1.0000 mg | INTRAMUSCULAR | Status: DC | PRN
  Administered 2020-10-13 – 2020-10-15 (×3): 1 mg via INTRAVENOUS
  Filled 2020-10-12 (×3): qty 1

## 2020-10-12 MED ORDER — GLYCOPYRROLATE 0.2 MG/ML IJ SOLN: 0.2000 mg | INTRAMUSCULAR | Status: DC | PRN

## 2020-10-12 MED ORDER — LORAZEPAM 2 MG/ML PO CONC: 1.0000 mg | ORAL | Status: DC | PRN

## 2020-10-12 MED ORDER — FUROSEMIDE 10 MG/ML IJ SOLN
40.0000 mg | Freq: Once | INTRAMUSCULAR | Status: AC
  Administered 2020-10-12: 40 mg via INTRAVENOUS
  Filled 2020-10-12: qty 4

## 2020-10-12 MED ORDER — HALOPERIDOL LACTATE 5 MG/ML IJ SOLN: 0.5000 mg | INTRAMUSCULAR | Status: DC | PRN

## 2020-10-12 MED ORDER — LACTATED RINGERS IV BOLUS (SEPSIS): 30.0000 mL/kg | Freq: Once | INTRAVENOUS | Status: DC

## 2020-10-12 MED ORDER — LORAZEPAM 1 MG PO TABS: 1.0000 mg | ORAL_TABLET | ORAL | Status: DC | PRN

## 2020-10-12 MED ORDER — SENNA 8.6 MG PO TABS: 1.0000 | ORAL_TABLET | Freq: Every evening | ORAL | Status: DC | PRN

## 2020-10-12 MED ORDER — HALOPERIDOL LACTATE 2 MG/ML PO CONC
0.5000 mg | ORAL | Status: DC | PRN
  Filled 2020-10-12: qty 0.3

## 2020-10-12 MED ORDER — POLYVINYL ALCOHOL 1.4 % OP SOLN
1.0000 [drp] | Freq: Four times a day (QID) | OPHTHALMIC | Status: DC | PRN
  Filled 2020-10-12: qty 15

## 2020-10-12 MED ORDER — BIOTENE DRY MOUTH MT LIQD: 15.0000 mL | OROMUCOSAL | Status: DC | PRN

## 2020-10-12 MED ORDER — ONDANSETRON HCL 4 MG/2ML IJ SOLN: 4.0000 mg | Freq: Four times a day (QID) | INTRAMUSCULAR | Status: DC | PRN

## 2020-10-12 MED ORDER — HALOPERIDOL 0.5 MG PO TABS
0.5000 mg | ORAL_TABLET | ORAL | Status: DC | PRN
  Filled 2020-10-12: qty 1

## 2020-10-12 MED ORDER — ACETAMINOPHEN 650 MG RE SUPP: 650.0000 mg | Freq: Four times a day (QID) | RECTAL | Status: DC | PRN

## 2020-10-12 MED ORDER — DIPHENHYDRAMINE HCL 50 MG/ML IJ SOLN: 12.5000 mg | INTRAMUSCULAR | Status: DC | PRN

## 2020-10-12 MED ORDER — LORAZEPAM 2 MG/ML IJ SOLN: 1.0000 mg | INTRAMUSCULAR | Status: DC | PRN

## 2020-10-12 NOTE — Plan of Care (Signed)

## 2020-10-12 NOTE — ED Notes (Signed)
Pt placed on Bair Hugger 

## 2020-10-12 NOTE — ED Notes (Signed)
Dr. Lorin Mercy aware of troponin level.

## 2020-10-12 NOTE — ED Provider Notes (Signed)
[facility] EMERGENCY DEPARTMENT Provider Note   CSN: [id number] Arrival date & time: 10/12/20  1019     History No chief complaint on file.   Lori Bridges is a 86 y.o. female.  HPI Patient recently discharged in the hospital [phone] for seizure disorder status post intracerebral hemorrhage.  She had persisting problems with mental status change and probable partial seizure.  Patient was discharged to [facility].  Patient daughter reports that at the beginning of the week the patient called her daughter on the phone and they had a conversation.  She reports she seemed to be doing well.  Her daughter reports a couple days ago however she seemed to be declining, her speech was becoming slurred and she is seeming more confused and sleepy.  She reports she was concerned that she might of had another stroke.  Patient daughter was also concerned that she might have stool impaction.  She reports she has been leaking stool around a hard ball of feces.  She reports that the nursing staff have been giving her some enemas but she is not convinced that this is resolved the problem.  Today physical therapy came to work with the patient and found her to be very poorly responsive.  She was found to have a rectal temperature of 94.6 and to be very lethargic and poorly responsive.  Oxygen saturation was 85% on room air.  Patient was transferred to the emergency department.    Past Medical History:  Diagnosis Date   Adenomatous colon polyp 2005   Anxiety age 19   Carotid artery bruit 2001   left   CKD (chronic kidney disease), stage III    DDD (degenerative disc disease)    DJD (degenerative joint disease)    Gastroenteritis 2011   GERD (gastroesophageal reflux disease) 2003   History of pneumonia    HTN (hypertension) 1983   Hyperlipidemia 2000   Hypothyroidism 2009   Iron deficiency anemia 1983   Osteoarthritis 2001   Osteoporosis 2001   Permanent atrial  fibrillation ([place]) 07/23/2010        Persistent headaches 1987   Pulmonary HTN ([place]) 04/03/2017   Moderate with PASP 12mmHg by echo 02/2017   Tricuspid regurgitation    severe by echo 02/25/12   Vertigo    Vitamin D deficiency 2011    Patient Active Problem List   Diagnosis Date Noted   Poor fluid intake    Adult failure to thrive    DNR (do not resuscitate)    Palliative care by specialist    Altered mental status    Pressure injury of skin 08/26/2020   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[place])    Seizure disorder ([place]) 08/19/2020   Fall    Peripheral edema    Sleep disturbance    Hyperkalemia    Hyponatremia    Leukopenia    Atrial fibrillation (HCC)    Thrombocytopenia (HCC)    Hypernatremia    Benign essential HTN    Dysphagia, post-stroke    PAF (paroxysmal atrial fibrillation) (HCC)    Goals of care, counseling/discussion    Gastroesophageal reflux disease    Seizure ([place]) 07/26/2020   Dysphagia due to recent stroke 07/26/2020   UTI (urinary tract infection) 07/26/2020   CKD (chronic kidney disease), stage IIIa 07/26/2020   Hypothyroidism 07/26/2020   ICH (intracerebral hemorrhage) (HCC) - 2 small L brain ICH, likely d/t HTN on subtherapeutic warfarin 07/22/2020   Chronic diastolic CHF (congestive  heart failure) ([place]) 04/03/2017   Pulmonary HTN ([place]) 04/03/2017   Chest pain 04/03/2017   Dizziness 06/30/2016   Encounter for therapeutic drug monitoring 01/24/2014   Hyperlipidemia 08/24/2013   Long term (current) use of anticoagulants 05/17/2013   Tricuspid regurgitation 03/04/2012   Nausea 02/16/2012   Personal history of colonic polyps 10/09/2011   HYPERTENSION, MILD 09/11/2010   Permanent atrial fibrillation ([place]) 07/23/2010    Past Surgical History:  Procedure Laterality Date   COLONOSCOPY W/ BIOPSIES AND POLYPECTOMY  09/16/2004   adenomatous polyp, diverticulosis, internal hemorrhoids   s/p BLT      TUBAL LIGATION       OB History   No obstetric history on file.     Family History  Problem Relation Age of Onset   Hypertension Father    Peripheral vascular disease Father    Diabetes Mother    Heart disease Brother 25   Liver cancer Sister     Social History   Tobacco Use   Smoking status: Never Smoker   Smokeless tobacco: Never Used  Vaping Use   Vaping Use: Never used  Substance Use Topics   Alcohol use: No   Drug use: No    Home Medications Prior to Admission medications   Medication Sig Start Date End Date Taking? Authorizing Provider  acetaminophen (TYLENOL) 325 MG tablet Take 2 tablets (650 mg total) by mouth every 4 (four) hours as needed for mild pain (or temp > 37.5 C (99.5 F)). 08/15/20   Angiulli, Lavon Paganini, PA-C  amantadine (SYMMETREL) 100 MG capsule Take 1 capsule (100 mg total) by mouth 2 (two) times daily. 09/13/20   Bonnielee Haff, MD  apixaban (ELIQUIS) 2.5 MG TABS tablet Take 1 tablet (2.5 mg total) by mouth 2 (two) times daily. 09/13/20   Bonnielee Haff, MD  feeding supplement, ENSURE ENLIVE, (ENSURE ENLIVE) LIQD Take 237 mLs by mouth 3 (three) times daily between meals. 09/13/20   Bonnielee Haff, MD  furosemide (LASIX) 40 MG tablet Take 1 tablet (40 mg total) by mouth daily. 09/13/20   Bonnielee Haff, MD  levETIRAcetam (KEPPRA) 1000 MG tablet Take 1 tablet (1,000 mg total) by mouth 2 (two) times daily. 09/13/20   Bonnielee Haff, MD  metoprolol tartrate (LOPRESSOR) 100 MG tablet Take 1 tablet (100 mg total) by mouth 2 (two) times daily. 09/13/20   Bonnielee Haff, MD  Multiple Vitamin (MULTIVITAMIN WITH MINERALS) TABS tablet Take 1 tablet by mouth daily. 09/13/20   Bonnielee Haff, MD  pantoprazole (PROTONIX) 40 MG tablet Take 1 tablet (40 mg total) by mouth daily. Patient taking differently: Take 40 mg by mouth daily as needed (for reflux).  08/15/20 08/15/21  Angiulli, Lavon Paganini, PA-C  PHENobarbital (LUMINAL) 64.8 MG tablet Take 1 tablet (64.8  mg total) by mouth at bedtime. 09/13/20   Bonnielee Haff, MD  SYNTHROID 50 MCG tablet Take 50 mcg by mouth daily before breakfast.    [provider]    Allergies    Doxycycline, Tape, Ezetimibe, Meclizine, Pravastatin, Colestipol, Sulfamethoxazole, Ace inhibitors, Codeine, Colestipol hcl, Lovastatin, and Sulfa antibiotics  Review of Systems   Review of Systems Level 5 caveat cannot obtain review of systems due to patient condition. Physical Exam Updated Vital Signs BP 104/78    Pulse 70    Temp (!) 94.4 F (34.7 C) (Rectal)    Resp 17    Ht 5\' 7"  (1.702 m)    Wt 77.9 kg    SpO2 90%    BMI 26.90  kg/m   Physical Exam Constitutional:      Comments: Patient is lethargic and tachypneic.  She can respond to some verbal interaction.  She quickly drifts back to sleep.  HENT:     Mouth/Throat:     Mouth: Mucous membranes are dry.  Eyes:     Pupils: Pupils are equal, round, and reactive to light.  Cardiovascular:     Comments: Heart regular. Pulmonary:     Comments: Mild to moderate increased work of breathing.  Crackles in the base lung fields Abdominal:     Palpations: Abdomen is soft.     Comments: Mild distention.  Patient seems to be uncomfortable with palpation of the lower abdomen.  Genitourinary:    Comments: Fecal impaction present.  Manually disimpacted several large brown stool balls with red mucousy blood coating on outside of the stool. Musculoskeletal:     Comments: 2+ peripheral pitting edema lower extremities  Skin:    General: Skin is warm and dry.  Neurological:     Comments: Patient is lethargic.  She is minimally responding to commands.  Cannot assess full neurologic condition     ED Results / Procedures / Treatments   Labs (all labs ordered are listed, but only abnormal results are displayed) Labs Reviewed  LACTIC ACID, PLASMA - Abnormal; Notable for the following components:      Result Value   Lactic Acid, Venous 5.0 (*)    All other components  within normal limits  COMPREHENSIVE METABOLIC PANEL - Abnormal; Notable for the following components:   CO2 21 (*)    Glucose, Bld 122 (*)    BUN 35 (*)    Creatinine, Ser 1.60 (*)    Calcium 10.9 (*)    AST 42 (*)    Alkaline Phosphatase 173 (*)    Total Bilirubin 2.9 (*)    GFR, Estimated 28 (*)    Anion gap 17 (*)    All other components within normal limits  CBC WITH DIFFERENTIAL/PLATELET - Abnormal; Notable for the following components:   RDW 17.1 (*)    nRBC 0.7 (*)    All other components within normal limits  PROTIME-INR - Abnormal; Notable for the following components:   Prothrombin Time 25.5 (*)    INR 2.4 (*)    All other components within normal limits  APTT - Abnormal; Notable for the following components:   aPTT 41 (*)    All other components within normal limits  BRAIN NATRIURETIC PEPTIDE - Abnormal; Notable for the following components:   B Natriuretic Peptide 785.6 (*)    All other components within normal limits  CK - Abnormal; Notable for the following components:   Total CK 32 (*)    All other components within normal limits  CBG MONITORING, ED - Abnormal; Notable for the following components:   Glucose-Capillary 122 (*)    All other components within normal limits  I-STAT ARTERIAL BLOOD GAS, ED - Abnormal; Notable for the following components:   pH, Arterial 7.483 (*)    pCO2 arterial 28.4 (*)    All other components within normal limits  RESPIRATORY PANEL BY RT PCR (FLU A&B, COVID)  CULTURE, BLOOD (SINGLE)  URINE CULTURE  MAGNESIUM  PHOSPHORUS  LACTIC ACID, PLASMA  URINALYSIS, ROUTINE W REFLEX MICROSCOPIC  BLOOD GAS, ARTERIAL  LEVETIRACETAM LEVEL  TROPONIN I (HIGH SENSITIVITY)    EKG EKG Interpretation  Date/Time:  Friday October 12 2020 10:36:19 EDT Ventricular Rate:  98 PR Interval:  QRS Duration: 62 QT Interval:  479 QTC Calculation: 563 R Axis:   -16 Text Interpretation: Atrial fibrillation Paired ventricular premature complexes  Anterolateral infarct, age indeterminate similar to previous but questionable ST coving ST limb leads  vs artifact Confirmed by Charlesetta Shanks [phone]) on 10/12/2020 11:31:42 AM   Radiology CT Abdomen Pelvis Wo Contrast  Result Date: 10/12/2020 CLINICAL DATA:  Respiratory illness.  Hypoxia EXAM: CT CHEST, ABDOMEN AND PELVIS WITHOUT CONTRAST TECHNIQUE: Multidetector CT imaging of the chest, abdomen and pelvis was performed following the standard protocol without IV contrast. COMPARISON:  CT abdomen and pelvis 03/03/2011 FINDINGS: CT CHEST FINDINGS Cardiovascular: Four-chamber cardiomegaly. No pericardial effusion. Thoracic aorta is nonaneurysmal. There are atherosclerotic calcifications of the aorta and coronary arteries. Main pulmonary trunk is nondilated. Mediastinum/Nodes: No axillary or mediastinal lymphadenopathy. Evaluation of the hilar structures is limited in the absence of intravenous contrast. No obvious hilar mass. Unremarkable thyroid. Trachea and esophagus within normal limits. Lungs/Pleura: Small to moderate right and small left pleural effusions. Associated compressive atelectasis. Remainder of the lung fields are clear. No pneumothorax. Musculoskeletal: No chest wall mass or suspicious bone lesions identified. Diffuse body wall edema. CT ABDOMEN PELVIS FINDINGS Hepatobiliary: Unremarkable unenhanced appearance of the liver. Layering hyperdensity within the gallbladder lumen suggesting biliary sludge. No evidence of biliary dilatation. Pancreas: Grossly unremarkable. Spleen: Unremarkable. Adrenals/Urinary Tract: Nodular configuration of the left adrenal gland similar in appearance to prior CT. Bilateral kidneys are within normal limits. No hydronephrosis. No renal or ureteral stone. Foley catheter is seen within the urinary bladder. Small amount of air within the bladder lumen is likely secondary to catheterization. Stomach/Bowel: Stomach is within normal limits. No evidence of bowel wall  thickening, distention, or inflammatory changes. Mild-moderate volume of stool throughout the colon. Vascular/Lymphatic: Aortic atherosclerosis. IVC is moderately distended, likely related to fluid status. No enlarged abdominal or pelvic lymph nodes. Reproductive: Coarsely calcified fibroids within the uterus. No adnexal masses identified. Other: Small volume ascites. No organized abdominopelvic fluid collection. No pneumoperitoneum. Musculoskeletal: Diffuse body wall edema. No acute osseous findings. IMPRESSION: 1. Findings suggestive of fluid overload state with small bilateral pleural effusions, small volume ascites, and diffuse anasarca. 2. Layering hyperdensity within the gallbladder lumen suggesting biliary sludge. 3. Mild-moderate volume of stool throughout the colon. 4. Four-chamber cardiomegaly. 5. Aortic atherosclerosis.  (ICD10-I70.0). Electronically Signed   By: Davina Poke D.O.   On: 10/12/2020 13:37   CT Head Wo Contrast  Result Date: 10/12/2020 CLINICAL DATA:  Mental status change. EXAM: CT HEAD WITHOUT CONTRAST TECHNIQUE: Contiguous axial images were obtained from the base of the skull through the vertex without intravenous contrast. COMPARISON:  CT head 08/22/2020, MRI head 08/28/2020. FINDINGS: Evaluation is mildly limited by patient motion. Within this limitation: Brain: Decreased hypoattenuation within the left parietal and occipital lobes, compatible with evolving encephalomalacia at the site of prior hemorrhage. Subtle hypoattenuation at the site of prior left temporal hemorrhage. No evidence of acute hemorrhage. No evidence of acute large vascular territory infarct. Additional scattered white matter hypodensities, likely sequela of chronic microvascular disease. Mild generalized cerebral atrophy with ex vacuo ventricular dilation. No hydrocephalus. No mass lesion or abnormal mass effect. Vascular: Calcific atherosclerosis. Skull: No acute fracture. Sinuses/Orbits: The sinuses are  clear. Other: No mastoid effusions. IMPRESSION: 1. No evidence of acute intracranial abnormality on this motion limited exam. 2. Evolving encephalomalacia at the sites of prior hemorrhage. Electronically Signed   By: Margaretha Sheffield MD   On: 10/12/2020 13:27   CT Chest Wo Contrast  Result Date: 10/12/2020 CLINICAL DATA:  Respiratory illness.  Hypoxia EXAM: CT CHEST, ABDOMEN AND PELVIS WITHOUT CONTRAST TECHNIQUE: Multidetector CT imaging of the chest, abdomen and pelvis was performed following the standard protocol without IV contrast. COMPARISON:  CT abdomen and pelvis 03/03/2011 FINDINGS: CT CHEST FINDINGS Cardiovascular: Four-chamber cardiomegaly. No pericardial effusion. Thoracic aorta is nonaneurysmal. There are atherosclerotic calcifications of the aorta and coronary arteries. Main pulmonary trunk is nondilated. Mediastinum/Nodes: No axillary or mediastinal lymphadenopathy. Evaluation of the hilar structures is limited in the absence of intravenous contrast. No obvious hilar mass. Unremarkable thyroid. Trachea and esophagus within normal limits. Lungs/Pleura: Small to moderate right and small left pleural effusions. Associated compressive atelectasis. Remainder of the lung fields are clear. No pneumothorax. Musculoskeletal: No chest wall mass or suspicious bone lesions identified. Diffuse body wall edema. CT ABDOMEN PELVIS FINDINGS Hepatobiliary: Unremarkable unenhanced appearance of the liver. Layering hyperdensity within the gallbladder lumen suggesting biliary sludge. No evidence of biliary dilatation. Pancreas: Grossly unremarkable. Spleen: Unremarkable. Adrenals/Urinary Tract: Nodular configuration of the left adrenal gland similar in appearance to prior CT. Bilateral kidneys are within normal limits. No hydronephrosis. No renal or ureteral stone. Foley catheter is seen within the urinary bladder. Small amount of air within the bladder lumen is likely secondary to catheterization. Stomach/Bowel:  Stomach is within normal limits. No evidence of bowel wall thickening, distention, or inflammatory changes. Mild-moderate volume of stool throughout the colon. Vascular/Lymphatic: Aortic atherosclerosis. IVC is moderately distended, likely related to fluid status. No enlarged abdominal or pelvic lymph nodes. Reproductive: Coarsely calcified fibroids within the uterus. No adnexal masses identified. Other: Small volume ascites. No organized abdominopelvic fluid collection. No pneumoperitoneum. Musculoskeletal: Diffuse body wall edema. No acute osseous findings. IMPRESSION: 1. Findings suggestive of fluid overload state with small bilateral pleural effusions, small volume ascites, and diffuse anasarca. 2. Layering hyperdensity within the gallbladder lumen suggesting biliary sludge. 3. Mild-moderate volume of stool throughout the colon. 4. Four-chamber cardiomegaly. 5. Aortic atherosclerosis.  (ICD10-I70.0). Electronically Signed   By: Davina Poke D.O.   On: 10/12/2020 13:37   DG Chest Port 1 View  Result Date: 10/12/2020 CLINICAL DATA:  Sepsis EXAM: PORTABLE CHEST 1 VIEW COMPARISON:  September 06, 2020 FINDINGS: There is cardiomegaly with pulmonary venous hypertension. There are pleural effusions bilaterally. There is bibasilar atelectasis. No frank edema or consolidation. No adenopathy. There is aortic atherosclerosis. No bone lesions. There is a skin fold on the right laterally. IMPRESSION: Cardiomegaly with pulmonary vascular congestion and pleural effusions. Suspect a degree of congestive heart failure. Bibasilar atelectasis. No frank edema or consolidation, however. Aortic Atherosclerosis (ICD10-I70.0). Electronically Signed   By: Lowella Grip III M.D.   On: 10/12/2020 11:10    Procedures Procedures (including critical care time) CRITICAL CARE Performed by: Charlesetta Shanks   Total critical care time: 60 minutes  Critical care time was exclusive of separately billable procedures and treating  other patients.  Critical care was necessary to treat or prevent imminent or life-threatening deterioration.  Critical care was time spent personally by me on the following activities: development of treatment plan with patient and/or surrogate as well as nursing, discussions with consultants, evaluation of patient's response to treatment, examination of patient, obtaining history from patient or surrogate, ordering and performing treatments and interventions, ordering and review of laboratory studies, ordering and review of radiographic studies, pulse oximetry and re-evaluation of patient's condition. Medications Ordered in ED Medications  furosemide (LASIX) injection 40 mg (has no administration in time range)    ED Course  I have reviewed the triage vital signs and the nursing notes.  Pertinent labs & imaging results that were available during my care of the patient were reviewed by me and considered in my medical decision making (see chart for details).    MDM Rules/Calculators/A&P                         Consult: Dr. Lorin Mercy for admission  Patient presents aligned above.  Multifactorial conditions with complicated comorbid illnesses.  Patient appears to have acute on chronic congestive heart failure.  She has significant volume overload.  Covid negative.  Patient placed on BiPAP.  She has tolerated BiPAP without agitation.  Patient was found to be hypothermic, Bair hugger placed.  I suspect symptoms are hypoperfusion due to congestive heart failure with certain spacing.  Also, patient has Foley catheter has been in place since August.  Very small amount of dark urine present.  However, bladder is not significantly distended and patient does not have significantly acute elevation in renal infection.  Will change catheter and obtain urine specimen for UTI evaluation.  with resuscitation patient's vital signs are improved.  Will plan for admission.  Patient with very poor overall prognosis.   Patient's daughter made aware that if there is improvement with current measures, patient will likely have again episode of failure to thrive and exacerbation of underlying medical problems. Final Clinical Impression(s) / ED Diagnoses Final diagnoses:  Acute on chronic right-sided congestive heart failure ([place])  Hypothermia, initial encounter  Severe comorbid illness    Rx / DC Orders ED Discharge Orders    None       Charlesetta Shanks, MD 10/13/20 2103

## 2020-10-12 NOTE — ED Triage Notes (Signed)
Pt here from [facility] for low temp, low oxygen. PT came to work with pt and found patient to be poorly responsive. Rectal 94.6, poor cap refill, gcs 9. Initially no radial pulse, 60/40 BP.  Cap 21-25, oxygen saturations 85% RA. EMS unable to establish IV en route.

## 2020-10-12 NOTE — ED Notes (Signed)
Report called to Lenora Boys on 6E. Patient transported up by RN and RT. All belongings with daughter. Patient on stretcher w foley to SD. PIV CDI and SL.

## 2020-10-12 NOTE — ED Notes (Signed)
Pt transported to CT ?

## 2020-10-12 NOTE — Progress Notes (Signed)
Placed pt on bipap per MD order when she returned from CT. Pt more sleepy at this time than when RT saw her previously. Pt placed on 12/6 R 15 80% and will wean o2 as tolerated. VS within normal limits. RT will continue to monitor

## 2020-10-12 NOTE — Progress Notes (Signed)
Pt taken off bipap and placed on 2L [place] per MD order as pt is transitioning to comfort care.

## 2020-10-12 NOTE — H&P (Signed)
History and Physical    Lori Bridges SEG:[id number] DOB: Dec 01, 1930 DOA: 10/12/2020  PCP: Derinda Late, MD Consultants:  Radford Pax - cardiology Patient coming from: [facility]; NOK: Daughter, Williemae Area, [phone]  Chief Complaint: SOB  HPI: Lori Bridges is a 86 y.o. female with medical history significant of pulmonary HTN; afib; hypothyroidism; HTN; and stage 3 CKD presenting with SOB.  She as previously serially hospitalized from July through September for sequelae of an ICH/CVA including seizures and need for rehab.  Since d/c to SNF, her daughter reports that she has not been doing very well.  She was talking some and participating in some therapy, but was having worsening pain over the last week or more and SOB for a week.  This week, she had diffuse swelling.  She has been constipated with overflow incontinence for about 2 weeks - disimpaction was done in the ER today with apparent relief.  The doctor at rehab increased her Lasix dose yesterday.    ED Course:  Overall poor prognosis.  Decline x 2-3 days with decreased activity, not eating/drinking well.  Slurred speech, lethargy, increased SOB.  BP 60/40 and hypotension with EMS, better in ER.  Hypoxia, on NRB in low 90s.  Still hypothermic but appears more like CHF with anasarca.  Lactate elevated.  Foley has been in since August, UA pending after replacement of foley.  EDP disimpacted her as well.  Possibly hypoperfusion due to heart failure.  Giving Lasix.  Review of Systems:  Unable to perform  Past Medical History:  Diagnosis Date  . Adenomatous colon polyp 2005  . Anxiety age 53  . Carotid artery bruit 2001   left  . CKD (chronic kidney disease), stage III ([place])   . DDD (degenerative disc disease)   . DJD (degenerative joint disease)   . Gastroenteritis 2011  . GERD (gastroesophageal reflux disease) 2003  . History of pneumonia   . HTN (hypertension) 1983  . Hyperlipidemia 2000  . Hypothyroidism 2009  .  Iron deficiency anemia 1983  . Osteoarthritis 2001  . Osteoporosis 2001  . Permanent atrial fibrillation ([place]) 07/23/2010       . Persistent headaches 1987  . Pulmonary HTN ([place]) 04/03/2017   Moderate with PASP 65mmHg by echo 02/2017  . Tricuspid regurgitation    severe by echo 02/25/12  . Vertigo   . Vitamin D deficiency 2011    Past Surgical History:  Procedure Laterality Date  . COLONOSCOPY W/ BIOPSIES AND POLYPECTOMY  09/16/2004   adenomatous polyp, diverticulosis, internal hemorrhoids  . s/p BLT    . TUBAL LIGATION      Social History   Socioeconomic History  . Marital status: Widowed    Spouse name: Not on file  . Number of children: 7  . Years of education: Not on file  . Highest education level: Not on file  Occupational History  . Occupation: Retired    Comment: Production assistant, radio  Tobacco Use  . Smoking status: Never Smoker  . Smokeless tobacco: Never Used  Vaping Use  . Vaping Use: Never used  Substance and Sexual Activity  . Alcohol use: No  . Drug use: No  . Sexual activity: Not Currently  Other Topics Concern  . Not on file  Social History Narrative  . Not on file   Social Determinants of Health   Financial Resource Strain:   . Difficulty of Paying Living Expenses: Not on file  Food Insecurity:   . Worried About Estate manager/land agent  of Food in the Last Year: Not on file  . Ran Out of Food in the Last Year: Not on file  Transportation Needs:   . Lack of Transportation (Medical): Not on file  . Lack of Transportation (Non-Medical): Not on file  Physical Activity:   . Days of Exercise per Week: Not on file  . Minutes of Exercise per Session: Not on file  Stress:   . Feeling of Stress : Not on file  Social Connections:   . Frequency of Communication with Friends and Family: Not on file  . Frequency of Social Gatherings with Friends and Family: Not on file  . Attends Religious Services: Not on file  . Active Member of Clubs or Organizations: Not on file  .  Attends Archivist Meetings: Not on file  . Marital Status: Not on file  Intimate Partner Violence:   . Fear of Current or Ex-Partner: Not on file  . Emotionally Abused: Not on file  . Physically Abused: Not on file  . Sexually Abused: Not on file    Allergies  Allergen Reactions  . Doxycycline Other (See Comments)    Reaction not recalled, but not tolerated   . Tape Other (See Comments)    SKIN IS VERY THIN- WILL TEAR EASILY!!  . Ezetimibe Other (See Comments)    Arthralgia, Myalgias  . Meclizine Other (See Comments) and Palpitations    Tachycardia  . Pravastatin Other (See Comments)    Arthralgia, Myalgias  . Colestipol Other (See Comments)    Malaise  . Sulfamethoxazole Other (See Comments)    Reaction not recalled, but not tolerated  . Ace Inhibitors Cough  . Codeine Nausea Only  . Colestipol Hcl Other (See Comments)    Malaise  . Lovastatin Other (See Comments)    Reaction not recalled- possibly muscle pain  . Sulfa Antibiotics Other (See Comments)    Reaction not recalled, but not tolerated    Family History  Problem Relation Age of Onset  . Hypertension Father   . Peripheral vascular disease Father   . Diabetes Mother   . Heart disease Brother 104  . Liver cancer Sister     Prior to Admission medications   Medication Sig Start Date End Date Taking? Authorizing Provider  acetaminophen (TYLENOL) 325 MG tablet Take 2 tablets (650 mg total) by mouth every 4 (four) hours as needed for mild pain (or temp > 37.5 C (99.5 F)). 08/15/20   Angiulli, Lavon Paganini, PA-C  amantadine (SYMMETREL) 100 MG capsule Take 1 capsule (100 mg total) by mouth 2 (two) times daily. 09/13/20   Bonnielee Haff, MD  apixaban (ELIQUIS) 2.5 MG TABS tablet Take 1 tablet (2.5 mg total) by mouth 2 (two) times daily. 09/13/20   Bonnielee Haff, MD  feeding supplement, ENSURE ENLIVE, (ENSURE ENLIVE) LIQD Take 237 mLs by mouth 3 (three) times daily between meals. 09/13/20   Bonnielee Haff, MD    furosemide (LASIX) 40 MG tablet Take 1 tablet (40 mg total) by mouth daily. 09/13/20   Bonnielee Haff, MD  levETIRAcetam (KEPPRA) 1000 MG tablet Take 1 tablet (1,000 mg total) by mouth 2 (two) times daily. 09/13/20   Bonnielee Haff, MD  metoprolol tartrate (LOPRESSOR) 100 MG tablet Take 1 tablet (100 mg total) by mouth 2 (two) times daily. 09/13/20   Bonnielee Haff, MD  Multiple Vitamin (MULTIVITAMIN WITH MINERALS) TABS tablet Take 1 tablet by mouth daily. 09/13/20   Bonnielee Haff, MD  pantoprazole (PROTONIX) 40 MG tablet Take  1 tablet (40 mg total) by mouth daily. Patient taking differently: Take 40 mg by mouth daily as needed (for reflux).  08/15/20 08/15/21  Angiulli, Lavon Paganini, PA-C  PHENobarbital (LUMINAL) 64.8 MG tablet Take 1 tablet (64.8 mg total) by mouth at bedtime. 09/13/20   Bonnielee Haff, MD  SYNTHROID 50 MCG tablet Take 50 mcg by mouth daily before breakfast.    [provider]    Physical Exam: Vitals:   10/12/20 1449 10/12/20 1451 10/12/20 1500 10/12/20 1600  BP:  98/75    Pulse:   100 98  Resp:  20 18 17   Temp:   (!) 94.7 F (34.8 C)   TempSrc:      SpO2: 94%  98% 95%  Weight:      Height:         . General:  Unresponsive/sedated, wearing CPAP, with Bair hugger in place . Eyes:  Closed, normal lids . Neck:  no LAD, masses or thyromegaly . Cardiovascular:  RRR, no m/r/g.  . Respiratory:   CTA bilaterally with no wheezes/rales/rhonchi.  Normal respiratory effort on BIPAP. Marland Kitchen Abdomen:  soft, NT . Skin:  Diffuse anasarca . Musculoskeletal: no bony abnormality . Lower extremity:   Limited foot exam with no ulcerations.  Marland Kitchen Psychiatric:  Somnolent, unresponsive . Neurologic:  Unable to perform    Radiological Exams on Admission: CT Abdomen Pelvis Wo Contrast  Result Date: 10/12/2020 CLINICAL DATA:  Respiratory illness.  Hypoxia EXAM: CT CHEST, ABDOMEN AND PELVIS WITHOUT CONTRAST TECHNIQUE: Multidetector CT imaging of the chest, abdomen and pelvis was  performed following the standard protocol without IV contrast. COMPARISON:  CT abdomen and pelvis 03/03/2011 FINDINGS: CT CHEST FINDINGS Cardiovascular: Four-chamber cardiomegaly. No pericardial effusion. Thoracic aorta is nonaneurysmal. There are atherosclerotic calcifications of the aorta and coronary arteries. Main pulmonary trunk is nondilated. Mediastinum/Nodes: No axillary or mediastinal lymphadenopathy. Evaluation of the hilar structures is limited in the absence of intravenous contrast. No obvious hilar mass. Unremarkable thyroid. Trachea and esophagus within normal limits. Lungs/Pleura: Small to moderate right and small left pleural effusions. Associated compressive atelectasis. Remainder of the lung fields are clear. No pneumothorax. Musculoskeletal: No chest wall mass or suspicious bone lesions identified. Diffuse body wall edema. CT ABDOMEN PELVIS FINDINGS Hepatobiliary: Unremarkable unenhanced appearance of the liver. Layering hyperdensity within the gallbladder lumen suggesting biliary sludge. No evidence of biliary dilatation. Pancreas: Grossly unremarkable. Spleen: Unremarkable. Adrenals/Urinary Tract: Nodular configuration of the left adrenal gland similar in appearance to prior CT. Bilateral kidneys are within normal limits. No hydronephrosis. No renal or ureteral stone. Foley catheter is seen within the urinary bladder. Small amount of air within the bladder lumen is likely secondary to catheterization. Stomach/Bowel: Stomach is within normal limits. No evidence of bowel wall thickening, distention, or inflammatory changes. Mild-moderate volume of stool throughout the colon. Vascular/Lymphatic: Aortic atherosclerosis. IVC is moderately distended, likely related to fluid status. No enlarged abdominal or pelvic lymph nodes. Reproductive: Coarsely calcified fibroids within the uterus. No adnexal masses identified. Other: Small volume ascites. No organized abdominopelvic fluid collection. No  pneumoperitoneum. Musculoskeletal: Diffuse body wall edema. No acute osseous findings. IMPRESSION: 1. Findings suggestive of fluid overload state with small bilateral pleural effusions, small volume ascites, and diffuse anasarca. 2. Layering hyperdensity within the gallbladder lumen suggesting biliary sludge. 3. Mild-moderate volume of stool throughout the colon. 4. Four-chamber cardiomegaly. 5. Aortic atherosclerosis.  (ICD10-I70.0). Electronically Signed   By: Davina Poke D.O.   On: 10/12/2020 13:37   CT Head Wo Contrast  Result Date: 10/12/2020 CLINICAL DATA:  Mental status change. EXAM: CT HEAD WITHOUT CONTRAST TECHNIQUE: Contiguous axial images were obtained from the base of the skull through the vertex without intravenous contrast. COMPARISON:  CT head 08/22/2020, MRI head 08/28/2020. FINDINGS: Evaluation is mildly limited by patient motion. Within this limitation: Brain: Decreased hypoattenuation within the left parietal and occipital lobes, compatible with evolving encephalomalacia at the site of prior hemorrhage. Subtle hypoattenuation at the site of prior left temporal hemorrhage. No evidence of acute hemorrhage. No evidence of acute large vascular territory infarct. Additional scattered white matter hypodensities, likely sequela of chronic microvascular disease. Mild generalized cerebral atrophy with ex vacuo ventricular dilation. No hydrocephalus. No mass lesion or abnormal mass effect. Vascular: Calcific atherosclerosis. Skull: No acute fracture. Sinuses/Orbits: The sinuses are clear. Other: No mastoid effusions. IMPRESSION: 1. No evidence of acute intracranial abnormality on this motion limited exam. 2. Evolving encephalomalacia at the sites of prior hemorrhage. Electronically Signed   By: Margaretha Sheffield MD   On: 10/12/2020 13:27   CT Chest Wo Contrast  Result Date: 10/12/2020 CLINICAL DATA:  Respiratory illness.  Hypoxia EXAM: CT CHEST, ABDOMEN AND PELVIS WITHOUT CONTRAST TECHNIQUE:  Multidetector CT imaging of the chest, abdomen and pelvis was performed following the standard protocol without IV contrast. COMPARISON:  CT abdomen and pelvis 03/03/2011 FINDINGS: CT CHEST FINDINGS Cardiovascular: Four-chamber cardiomegaly. No pericardial effusion. Thoracic aorta is nonaneurysmal. There are atherosclerotic calcifications of the aorta and coronary arteries. Main pulmonary trunk is nondilated. Mediastinum/Nodes: No axillary or mediastinal lymphadenopathy. Evaluation of the hilar structures is limited in the absence of intravenous contrast. No obvious hilar mass. Unremarkable thyroid. Trachea and esophagus within normal limits. Lungs/Pleura: Small to moderate right and small left pleural effusions. Associated compressive atelectasis. Remainder of the lung fields are clear. No pneumothorax. Musculoskeletal: No chest wall mass or suspicious bone lesions identified. Diffuse body wall edema. CT ABDOMEN PELVIS FINDINGS Hepatobiliary: Unremarkable unenhanced appearance of the liver. Layering hyperdensity within the gallbladder lumen suggesting biliary sludge. No evidence of biliary dilatation. Pancreas: Grossly unremarkable. Spleen: Unremarkable. Adrenals/Urinary Tract: Nodular configuration of the left adrenal gland similar in appearance to prior CT. Bilateral kidneys are within normal limits. No hydronephrosis. No renal or ureteral stone. Foley catheter is seen within the urinary bladder. Small amount of air within the bladder lumen is likely secondary to catheterization. Stomach/Bowel: Stomach is within normal limits. No evidence of bowel wall thickening, distention, or inflammatory changes. Mild-moderate volume of stool throughout the colon. Vascular/Lymphatic: Aortic atherosclerosis. IVC is moderately distended, likely related to fluid status. No enlarged abdominal or pelvic lymph nodes. Reproductive: Coarsely calcified fibroids within the uterus. No adnexal masses identified. Other: Small volume  ascites. No organized abdominopelvic fluid collection. No pneumoperitoneum. Musculoskeletal: Diffuse body wall edema. No acute osseous findings. IMPRESSION: 1. Findings suggestive of fluid overload state with small bilateral pleural effusions, small volume ascites, and diffuse anasarca. 2. Layering hyperdensity within the gallbladder lumen suggesting biliary sludge. 3. Mild-moderate volume of stool throughout the colon. 4. Four-chamber cardiomegaly. 5. Aortic atherosclerosis.  (ICD10-I70.0). Electronically Signed   By: Davina Poke D.O.   On: 10/12/2020 13:37   DG Chest Port 1 View  Result Date: 10/12/2020 CLINICAL DATA:  Sepsis EXAM: PORTABLE CHEST 1 VIEW COMPARISON:  September 06, 2020 FINDINGS: There is cardiomegaly with pulmonary venous hypertension. There are pleural effusions bilaterally. There is bibasilar atelectasis. No frank edema or consolidation. No adenopathy. There is aortic atherosclerosis. No bone lesions. There is a skin fold on the right laterally.  IMPRESSION: Cardiomegaly with pulmonary vascular congestion and pleural effusions. Suspect a degree of congestive heart failure. Bibasilar atelectasis. No frank edema or consolidation, however. Aortic Atherosclerosis (ICD10-I70.0). Electronically Signed   By: Lowella Grip III M.D.   On: 10/12/2020 11:10    EKG: Independently reviewed.  Afib with rate 98; low voltage, nonspecific ST changes with no evidence of acute ischemia   Labs on Admission: I have personally reviewed the available labs and imaging studies at the time of the admission.  Pertinent labs:   Glucose 122 BN 35/Creatinine 1.60/GFR 28 Calcium 10.9 Anion gap 17 AP 173 AST 42/ALT 26/Bili 2.9 BNP 785.6 CK 32 Lactate 5.0 WBC 7.7 INR 2.4 COVID/flu negative   Assessment/Plan Principal Problem:   Admission for end of life care Active Problems:   Permanent atrial fibrillation (HCC)   ICH (intracerebral hemorrhage) (HCC) - 2 small L brain ICH, likely d/t HTN  on subtherapeutic warfarin   Seizure ([place])   Dysphagia, post-stroke   Acute on chronic right-sided congestive heart failure ([place])    -Patient with diffuse volume overload/anasarca - likely associated with acute on chronic R heart failure -Elevated BNP, CXR with edema -Placed on BIPAP -Also with lactic acidosis and hypothermia but not thought to be sepsis - instead, likely related to hypoperfusion in the setting of severe heart failure -Also with recent CVA/ICH and seizures -Generally poor prognosis prior to current presentation and has not responded well to therapy -Based on above and after long discussion in the ER, family has decided to proceed with comfort care only -She was admitted to [facility] for comfort care and palliative care consult -Patient is likely to be a candidate for [employer] or other residential hospice, as she does not appear to be actively dying at this time -However, her reserve is likely quite low given her age and overall frailty -Comfort care order set utilized -No antibiotics or IVF as per family's request -Pain control with morphine as needed, would transition to a drip if needed but patient does not appear to be in pain at this time      Note: This patient has been tested and is negative for the novel coronavirus COVID-19.     DVT prophylaxis: None - comfort measures Code Status: DNR - confirmed with family Family Communication: Daughter was present throughout evaluation Disposition Plan: Anticipate in-hospital death Consults called: Palliative care Admission status: Admit - It is my clinical opinion that admission to INPATIENT is reasonable and necessary because of the expectation that this patient will require hospital care that crosses at least 2 midnights to treat this condition based on the medical complexity of the problems presented.  Given the aforementioned information, the predictability of an adverse outcome is felt to be  significant.   Karmen Bongo MD [facility]   How to contact the [facility] Attending or Consulting provider [place] or covering provider during after hours [place], for this patient?  1. Check the care team in [facility] and look for a) attending/consulting [facility] provider listed and b) the [facility] team listed 2. Log into [email or web address] and use [place]'s universal password to access. If you do not have the password, please contact the hospital operator. 3. Locate the [facility] provider you are looking for under [facility] and page to a number that you can be directly reached. 4. If you still have difficulty reaching the provider, please page the [facility] (Director on Call) for the Hospitalists listed on amion for assistance.   10/12/2020,  6:06 PM

## 2020-10-13 DIAGNOSIS — I50813 Acute on chronic right heart failure: Secondary | ICD-10-CM

## 2020-10-13 MED ORDER — CHLORHEXIDINE GLUCONATE CLOTH 2 % EX PADS
6.0000 | MEDICATED_PAD | Freq: Every day | CUTANEOUS | Status: DC
  Administered 2020-10-13 – 2020-10-17 (×5): 6 via TOPICAL

## 2020-10-13 NOTE — Progress Notes (Signed)
[facility] PROGRESS NOTE   Lori Bridges IRS:[id number] DOB: [birth year] DOA: 10/12/2020  PCP: Derinda Late, MD  Brief History/Interval Summary: 86 y.o. female with medical history significant of pulmonary HTN; afib; hypothyroidism; HTN; and stage 3 CKD presenting with SOB.    She was previously hospitalized on multiple occasions from July through September for sequelae of an ICH/CVA including seizures and need for rehab.  Since d/c to SNF, her daughter reports that she has not been doing very well.  She was talking some and participating in some therapy, but was having worsening pain over the last week or more and SOB for a week.  She has been constipated with overflow incontinence for about 2 weeks - disimpaction was done in the ER with apparent relief.  The doctor at rehab increased her Lasix dose.  Patient noted to be very lethargic.  Noted to have elevated lactic acid levels.  Overall prognosis was thought to be poor.  Discussed with family members.  Transitioned to comfort care.   Consultants: Palliative care   Subjective/Interval History: Patient remains unresponsive.  Family is at bedside.     Assessment/Plan:  Patient with history of atrial fibrillation, intracranial hemorrhage, essential hypertension, seizures, post stroke dysphagia, chronic right-sided congestive heart failure with acute exacerbation.  Patient presented with diffuse volume overload and anasarca.  Also noted to have lactic acidosis and hypothermia.  This was thought to be secondary to hypoperfusion.  Sepsis ruled out.    Patient's prognosis thought to be poor.  Discussions held with family members.  Patient was transitioned to comfort care.  Palliative care has been consulted.     Status is: Inpatient  Remains inpatient appropriate because:Altered mental status, Unsafe d/c plan and Inpatient level of care appropriate due to severity of illness   Dispo: The patient is from: SNF               Anticipated d/c is to: in-hospital death versus placement to residential hospice              Anticipated d/c date is: 2 days              Patient currently is not medically stable to d/c.      Medications: Scheduled:  Continuous:  KKX:[id number] **OR** acetaminophen, antiseptic oral rinse, diphenhydrAMINE, glycopyrrolate **OR** glycopyrrolate **OR** glycopyrrolate, haloperidol **OR** haloperidol **OR** haloperidol lactate, LORazepam **OR** LORazepam **OR** LORazepam, morphine injection, ondansetron **OR** ondansetron (ZOFRAN) IV, polyvinyl alcohol, senna   Objective:  Vital Signs  Vitals:   10/12/20 1451 10/12/20 1500 10/12/20 1600 10/12/20 1944  BP: 98/75   102/87  Pulse:  100 98 100  Resp: 20 18 17 18   Temp:  (!) 94.7 F (34.8 C)  (!) 95 F (35 C)  TempSrc:    Rectal  SpO2:  98% 95% 95%  Weight:      Height:        Intake/Output Summary (Last 24 hours) at 10/13/2020 1138 Last data filed at 10/13/2020 0428 Gross per 24 hour  Intake --  Output 500 ml  Net -500 ml   Filed Weights   10/12/20 1045  Weight: 77.9 kg    Patient is unresponsive.  Coarse breath sounds bilaterally.  No definite crackles or wheezing. S1-S2 is normal, regular.  Abdomen is soft.  Lab Results:  Data Reviewed: I have personally reviewed following labs and imaging studies  CBC: Recent Labs  Lab 10/12/20 1050 10/12/20 1108  WBC 7.7  --   NEUTROABS 5.9  --  HGB 13.4 15.0  HCT 42.4 44.0  MCV 95.9  --   PLT 166  --     Basic Metabolic Panel: Recent Labs  Lab 10/12/20 1050 10/12/20 1108  NA 137 139  K 4.6 4.3  CL 99  --   CO2 21*  --   GLUCOSE 122*  --   BUN 35*  --   CREATININE 1.60*  --   CALCIUM 10.9*  --   MG 2.2  --   PHOS 3.9  --     GFR: Estimated Creatinine Clearance: 26.1 mL/min (A) (by C-G formula based on SCr of 1.6 mg/dL (H)).  Liver Function Tests: Recent Labs  Lab 10/12/20 1050  AST 42*  ALT 26  ALKPHOS 173*  BILITOT 2.9*  PROT 6.5  ALBUMIN  3.5     Coagulation Profile: Recent Labs  Lab 10/12/20 1050  INR 2.4*    Cardiac Enzymes: Recent Labs  Lab 10/12/20 1050  CKTOTAL 32*    CBG: Recent Labs  Lab 10/12/20 1048  GLUCAP 122*     Recent Results (from the past 240 hour(s))  Blood culture (routine single)     Status: None (Preliminary result)   Collection Time: 10/12/20 10:50 AM   Specimen: BLOOD  Result Value Ref Range Status   Specimen Description BLOOD SITE NOT SPECIFIED  Final   Special Requests   Final    BOTTLES DRAWN AEROBIC AND ANAEROBIC Blood Culture results may not be optimal due to an inadequate volume of blood received in culture bottles   Culture   Final    NO GROWTH < 24 HOURS Performed at [facility], [place] [address], [place], [place] [address]    Report Status PENDING  Incomplete  Respiratory Panel by RT PCR (Flu A&B, Covid) - Nasopharyngeal Swab     Status: None   Collection Time: 10/12/20 11:35 AM   Specimen: Nasopharyngeal Swab  Result Value Ref Range Status   SARS Coronavirus 2 by RT PCR NEGATIVE NEGATIVE Final    Comment: (NOTE) SARS-CoV-2 target nucleic acids are NOT DETECTED.  The SARS-CoV-2 RNA is generally detectable in upper respiratoy specimens during the acute phase of infection. The lowest concentration of SARS-CoV-2 viral copies this assay can detect is 131 copies/mL. A negative result does not preclude SARS-Cov-2 infection and should not be used as the sole basis for treatment or other patient management decisions. A negative result may occur with  improper specimen collection/handling, submission of specimen other than nasopharyngeal swab, presence of viral mutation(s) within the areas targeted by this assay, and inadequate number of viral copies (<131 copies/mL). A negative result must be combined with clinical observations, patient history, and epidemiological information. The expected result is Negative.  Fact Sheet for Patients:    PinkCheek.be  Fact Sheet for Healthcare Providers:  GravelBags.it  This test is no t yet approved or cleared by the [place] FDA and  has been authorized for detection and/or diagnosis of SARS-CoV-2 by FDA under an Emergency Use Authorization (EUA). This EUA will remain  in effect (meaning this test can be used) for the duration of the COVID-19 declaration under Section 564(b)(1) of the Act, 21 U.S.C. section 360bbb-3(b)(1), unless the authorization is terminated or revoked sooner.     Influenza A by PCR NEGATIVE NEGATIVE Final   Influenza B by PCR NEGATIVE NEGATIVE Final    Comment: (NOTE) The Xpert Xpress SARS-CoV-2/FLU/RSV assay is intended as an aid in  the diagnosis of influenza from Nasopharyngeal  swab specimens and  should not be used as a sole basis for treatment. Nasal washings and  aspirates are unacceptable for Xpert Xpress SARS-CoV-2/FLU/RSV  testing.  Fact Sheet for Patients: PinkCheek.be  Fact Sheet for Healthcare Providers: GravelBags.it  This test is not yet approved or cleared by the [place] FDA and  has been authorized for detection and/or diagnosis of SARS-CoV-2 by  FDA under an Emergency Use Authorization (EUA). This EUA will remain  in effect (meaning this test can be used) for the duration of the  Covid-19 declaration under Section 564(b)(1) of the Act, 21  U.S.C. section 360bbb-3(b)(1), unless the authorization is  terminated or revoked. Performed at [facility], [place] [address], [place], [place] [address]       Radiology Studies: CT Abdomen Pelvis Wo Contrast  Result Date: 10/12/2020 CLINICAL DATA:  Respiratory illness.  Hypoxia EXAM: CT CHEST, ABDOMEN AND PELVIS WITHOUT CONTRAST TECHNIQUE: Multidetector CT imaging of the chest, abdomen and pelvis was performed following the standard protocol without IV contrast.  COMPARISON:  CT abdomen and pelvis 03/03/2011 FINDINGS: CT CHEST FINDINGS Cardiovascular: Four-chamber cardiomegaly. No pericardial effusion. Thoracic aorta is nonaneurysmal. There are atherosclerotic calcifications of the aorta and coronary arteries. Main pulmonary trunk is nondilated. Mediastinum/Nodes: No axillary or mediastinal lymphadenopathy. Evaluation of the hilar structures is limited in the absence of intravenous contrast. No obvious hilar mass. Unremarkable thyroid. Trachea and esophagus within normal limits. Lungs/Pleura: Small to moderate right and small left pleural effusions. Associated compressive atelectasis. Remainder of the lung fields are clear. No pneumothorax. Musculoskeletal: No chest wall mass or suspicious bone lesions identified. Diffuse body wall edema. CT ABDOMEN PELVIS FINDINGS Hepatobiliary: Unremarkable unenhanced appearance of the liver. Layering hyperdensity within the gallbladder lumen suggesting biliary sludge. No evidence of biliary dilatation. Pancreas: Grossly unremarkable. Spleen: Unremarkable. Adrenals/Urinary Tract: Nodular configuration of the left adrenal gland similar in appearance to prior CT. Bilateral kidneys are within normal limits. No hydronephrosis. No renal or ureteral stone. Foley catheter is seen within the urinary bladder. Small amount of air within the bladder lumen is likely secondary to catheterization. Stomach/Bowel: Stomach is within normal limits. No evidence of bowel wall thickening, distention, or inflammatory changes. Mild-moderate volume of stool throughout the colon. Vascular/Lymphatic: Aortic atherosclerosis. IVC is moderately distended, likely related to fluid status. No enlarged abdominal or pelvic lymph nodes. Reproductive: Coarsely calcified fibroids within the uterus. No adnexal masses identified. Other: Small volume ascites. No organized abdominopelvic fluid collection. No pneumoperitoneum. Musculoskeletal: Diffuse body wall edema. No acute  osseous findings. IMPRESSION: 1. Findings suggestive of fluid overload state with small bilateral pleural effusions, small volume ascites, and diffuse anasarca. 2. Layering hyperdensity within the gallbladder lumen suggesting biliary sludge. 3. Mild-moderate volume of stool throughout the colon. 4. Four-chamber cardiomegaly. 5. Aortic atherosclerosis.  (ICD10-I70.0). Electronically Signed   By: Davina Poke D.O.   On: 10/12/2020 13:37   CT Head Wo Contrast  Result Date: 10/12/2020 CLINICAL DATA:  Mental status change. EXAM: CT HEAD WITHOUT CONTRAST TECHNIQUE: Contiguous axial images were obtained from the base of the skull through the vertex without intravenous contrast. COMPARISON:  CT head 08/22/2020, MRI head 08/28/2020. FINDINGS: Evaluation is mildly limited by patient motion. Within this limitation: Brain: Decreased hypoattenuation within the left parietal and occipital lobes, compatible with evolving encephalomalacia at the site of prior hemorrhage. Subtle hypoattenuation at the site of prior left temporal hemorrhage. No evidence of acute hemorrhage. No evidence of acute large vascular territory infarct. Additional scattered white matter hypodensities,  likely sequela of chronic microvascular disease. Mild generalized cerebral atrophy with ex vacuo ventricular dilation. No hydrocephalus. No mass lesion or abnormal mass effect. Vascular: Calcific atherosclerosis. Skull: No acute fracture. Sinuses/Orbits: The sinuses are clear. Other: No mastoid effusions. IMPRESSION: 1. No evidence of acute intracranial abnormality on this motion limited exam. 2. Evolving encephalomalacia at the sites of prior hemorrhage. Electronically Signed   By: Margaretha Sheffield MD   On: 10/12/2020 13:27   CT Chest Wo Contrast  Result Date: 10/12/2020 CLINICAL DATA:  Respiratory illness.  Hypoxia EXAM: CT CHEST, ABDOMEN AND PELVIS WITHOUT CONTRAST TECHNIQUE: Multidetector CT imaging of the chest, abdomen and pelvis was  performed following the standard protocol without IV contrast. COMPARISON:  CT abdomen and pelvis 03/03/2011 FINDINGS: CT CHEST FINDINGS Cardiovascular: Four-chamber cardiomegaly. No pericardial effusion. Thoracic aorta is nonaneurysmal. There are atherosclerotic calcifications of the aorta and coronary arteries. Main pulmonary trunk is nondilated. Mediastinum/Nodes: No axillary or mediastinal lymphadenopathy. Evaluation of the hilar structures is limited in the absence of intravenous contrast. No obvious hilar mass. Unremarkable thyroid. Trachea and esophagus within normal limits. Lungs/Pleura: Small to moderate right and small left pleural effusions. Associated compressive atelectasis. Remainder of the lung fields are clear. No pneumothorax. Musculoskeletal: No chest wall mass or suspicious bone lesions identified. Diffuse body wall edema. CT ABDOMEN PELVIS FINDINGS Hepatobiliary: Unremarkable unenhanced appearance of the liver. Layering hyperdensity within the gallbladder lumen suggesting biliary sludge. No evidence of biliary dilatation. Pancreas: Grossly unremarkable. Spleen: Unremarkable. Adrenals/Urinary Tract: Nodular configuration of the left adrenal gland similar in appearance to prior CT. Bilateral kidneys are within normal limits. No hydronephrosis. No renal or ureteral stone. Foley catheter is seen within the urinary bladder. Small amount of air within the bladder lumen is likely secondary to catheterization. Stomach/Bowel: Stomach is within normal limits. No evidence of bowel wall thickening, distention, or inflammatory changes. Mild-moderate volume of stool throughout the colon. Vascular/Lymphatic: Aortic atherosclerosis. IVC is moderately distended, likely related to fluid status. No enlarged abdominal or pelvic lymph nodes. Reproductive: Coarsely calcified fibroids within the uterus. No adnexal masses identified. Other: Small volume ascites. No organized abdominopelvic fluid collection. No  pneumoperitoneum. Musculoskeletal: Diffuse body wall edema. No acute osseous findings. IMPRESSION: 1. Findings suggestive of fluid overload state with small bilateral pleural effusions, small volume ascites, and diffuse anasarca. 2. Layering hyperdensity within the gallbladder lumen suggesting biliary sludge. 3. Mild-moderate volume of stool throughout the colon. 4. Four-chamber cardiomegaly. 5. Aortic atherosclerosis.  (ICD10-I70.0). Electronically Signed   By: Davina Poke D.O.   On: 10/12/2020 13:37   DG Chest Port 1 View  Result Date: 10/12/2020 CLINICAL DATA:  Sepsis EXAM: PORTABLE CHEST 1 VIEW COMPARISON:  September 06, 2020 FINDINGS: There is cardiomegaly with pulmonary venous hypertension. There are pleural effusions bilaterally. There is bibasilar atelectasis. No frank edema or consolidation. No adenopathy. There is aortic atherosclerosis. No bone lesions. There is a skin fold on the right laterally. IMPRESSION: Cardiomegaly with pulmonary vascular congestion and pleural effusions. Suspect a degree of congestive heart failure. Bibasilar atelectasis. No frank edema or consolidation, however. Aortic Atherosclerosis (ICD10-I70.0). Electronically Signed   By: Lowella Grip III M.D.   On: 10/12/2020 11:10       LOS: 1 day   Sheilyn Boehlke [employer] on [email or web address]  10/13/2020, 11:38 AM

## 2020-10-13 NOTE — Consult Note (Addendum)
Palliative Medicine Inpatient Consult Note  Reason for consult:  Comfort care  HPI: Per intake H&P --> "Lori Bridges is a 86 y.o. female with medical history significant of pulmonary HTN; afib; hypothyroidism; HTN; and stage 3 CKD presenting with SOB.  She as previously serially hospitalized from July through September for sequelae of an ICH/CVA including seizures and need for rehab.  Since d/c to SNF, her daughter reports that she has not been doing very well.  She was talking some and participating in some therapy, but was having worsening pain over the last week or more and SOB for a week.  This week, she had diffuse swelling.  She has been constipated with overflow incontinence for about 2 weeks - disimpaction was done in the ER today with apparent relief.  The doctor at rehab increased her Lasix dose yesterday." (Thursday).  Palliative medicine team is familiar with Lori Bridges and her family from her September admission. She also has hyperlipidemia, GERD, and degenerative disc disease.    Clinical Assessment/Goals of Care: I have reviewed medical records including EPIC notes, labs and imaging, received report from bedside RN, assessed the patient.   Upon my assessment, patient was restless and constantly moving in bed, removing nasal cannula when it was replace. Her son stated she did this all night. I spoke with her nurse, Lori Bridges and she is medicating her with lorazepam.    I met with son Lori Bridges in room and by phone with daughter Lori Bridges [phone] to further discuss diagnosis prognosis, Lori Bridges, EOL wishes, disposition and options.   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 She has 6 daughters and 1 son who all live local.  She retired about 14 years ago after her husband died.Prior to her intreacerbral hemorrhage earlier this year she had lived at home taking care of herself but a nephew lived in the home with her.   I re-introduced Palliative Medicine as  specialized medical care for people living with serious illness. It focuses on providing relief from the symptoms and stress of a serious illness. The goal is to improve quality of life for both the patient and the family.  A detailed discussion was had today regarding comfort care and hospice. Values and goals of care important to patient and family were attempted to be elicited. The current decision maker Lori Bridges has had past negative experiences with hospice with her husband child and Dad. She stated that "they just give morphine and rush death."  She thought her mother would be staying in the hospital hospice, and that the current room she is in was for that reason. I tried to clarify. I shared that we do not have an inpatient hospice unit. That we have initiated comfort measure and will continue to evaluate her medications and care to keep her comfortable. I discussed goals of hospice.  Currently Lori Bridges is not showing signs of imminent death. I discussed the role of inpatient hospice homes. Lori Bridges states she does not want a referral to hospice. I asked if she would like her to transfer back to [facility] and she stated they were not happy with that facility. We will continue conversations with family and I will meet with them in person when they are on the unit later today.  Addendum: Met with 3 sisters and reviewed current assessment. Family agreed to referral to Hospice for discharge planning. They would prefer [facility] in [place].  One daughter has had  friends cared for there and stated she felt this would be a good option for their mother. They reiterated that going back to [facility] is not an option nor is home care. Questions were answered and family was in agreement with plan.  Discussed the importance of continued conversation with family and their  medical providers regarding overall plan of care and treatment options, ensuring decisions are within the context of the  patients values and GOCs.  Decision Maker:curently Lori Bridges per family. Daughter Lori Bridges has given decision making to her sister per family.  Gone From my sight booklet provided to family.  SUMMARY OF RECOMMENDATIONS    Code Status/Advance Care Planning:  DNAR/DNI   Symptom Management:  Pain acetaminophen, morphine prn Nausea: odansetron Itching: diphenhydramine prn Excess secretions: glycopyrrolate prn Agitation, anxiety or delirium: lorazepam, Haloperidol prn Dry eyes: polyvinyl acohol ophthalmic soln prn Constipation: senna prn  Difficulty breathing: oxygen, morphine for breathlessness prn  I will review medications for sublingual options.   Palliative Prophylaxis:   Oral care, turn q 2 hours  Comfort feed   Psycho-social/Spiritual:   Desire for further Chaplaincy support: declined, family is Jehovah Witness  Additional Recommendations: no blood products   Prognosis: poor, although she is not showing signs of imminent death, anticipate death may occur in hospital  Discharge Planning: Patient may continue to decline and die in hospital. After family meeting family is agreeable to inpatient hospice, preferably [facility] if patient lingers. Family states they cannot care for her at home.    PPS: 10%    Time In: 8:00 Time Out:9:15 Total Time: >75 minutes Greater than 50%  of this time was spent counseling and coordinating care related to the above assessment and plan.  [name], NP [facility] Palliative Medicine Team Team Cell Phone: [phone] Please utilize secure chat with additional questions, if there is no response within 30 minutes please call the above phone number  Palliative Medicine Team providers are available by phone from 7am to 7pm daily and can be reached through the team cell phone.  Should this patient require assistance outside of these hours, please call the patient's attending physician.

## 2020-10-13 NOTE — Plan of Care (Signed)

## 2020-10-14 DIAGNOSIS — R569 Unspecified convulsions: Secondary | ICD-10-CM

## 2020-10-14 DIAGNOSIS — I610 Nontraumatic intracerebral hemorrhage in hemisphere, subcortical: Secondary | ICD-10-CM

## 2020-10-14 DIAGNOSIS — Z515 Encounter for palliative care: Secondary | ICD-10-CM

## 2020-10-14 NOTE — Progress Notes (Signed)
Daily Progress Note   Patient Name: Lori Bridges       Date: 10/14/2020 DOB: 13-Feb-1930  Age: 86 y.o. MRN#: [id number] Attending Physician: Bonnielee Haff, MD Primary Care Physician: Derinda Late, MD Admit Date: 10/12/2020  Reason for Consultation/Follow-up: symptom check   Subjective: Patient's 3 daughters are at bedside.  Patient has no complaints.  She stirs at my exam but does not speak with me.  She appears in no distress.    Assessment: CV irreg and rapid.  Otherwise appears comfortable and in no distress.   Patient Profile/HPI:  Per intake H&P --> "Lori Bridges a 86 y.o.femalewith medical history significant ofpulmonary HTN; afib; hypothyroidism; HTN; and stage 3 CKD presenting with SOB. She as previously serially hospitalized from July through September for sequelae of an ICH/CVA including seizures and need for rehab. Since d/c to SNF, her daughter reports that she has not been doing very well. She was talking some and participating in some therapy, but was having worsening pain over the last week or more and SOB for a week. This week, she had diffuse swelling. She has been constipated with overflow incontinence for about 2 weeks - disimpaction was done in the ER today with apparent relief. The doctor at rehab increased her Lasix dose yesterday." (Thursday).  Palliative medicine team is familiar with Lori Bridges and her family from her September admission. She also has hyperlipidemia, GERD, and degenerative disc disease.      Length of Stay: 2   Vital Signs: BP 111/81 (BP Location: Left Arm)   Pulse (!) 110   Temp 98.3 F (36.8 C) (Axillary)   Resp 18   Ht 5\' 7"  (1.702 m)   Wt 77.9 kg   SpO2 96%   BMI 26.90 kg/m  SpO2: SpO2: 96 % O2 Device: O2  Device: Bi-PAP O2 Flow Rate: O2 Flow Rate (L/min): 15 L/min       Palliative Assessment/Data: 20%     Palliative Care Plan    Recommendations/Plan:  Appears comfortable.  [facility] placement.  Code Status:  DNR  Prognosis:   < 2 weeks   Discharge Planning:  Hospice facility  Care plan was discussed with family at bedside  Thank you for allowing the Palliative Medicine Team to assist in the care of this patient.  Total  time spent:  15 min.     Greater than 50%  of this time was spent counseling and coordinating care related to the above assessment and plan.  Florentina Jenny, PA-C Palliative Medicine  Please contact Palliative MedicineTeam phone at [phone] for questions and concerns between 7 am - 7 pm.   Please see AMION for individual provider pager numbers.

## 2020-10-14 NOTE — Progress Notes (Addendum)
[facility] PROGRESS NOTE   Lori Bridges OIZ:[id number] DOB: July 03, 1930 DOA: 10/12/2020  PCP: Derinda Late, MD  Brief History/Interval Summary: 86 y.o. female with medical history significant of pulmonary HTN; afib; hypothyroidism; HTN; and stage 3 CKD presenting with SOB.    She was previously hospitalized on multiple occasions from July through September for sequelae of an ICH/CVA including seizures and need for rehab.  Since d/c to SNF, her daughter reports that she has not been doing very well.  She was talking some and participating in some therapy, but was having worsening pain over the last week or more and SOB for a week.  She has been constipated with overflow incontinence for about 2 weeks - disimpaction was done in the ER with apparent relief.  The doctor at rehab increased her Lasix dose.  Patient noted to be very lethargic.  Noted to have elevated lactic acid levels.  Overall prognosis was thought to be poor.  Discussed with family members.  Transitioned to comfort care.   Consultants: Palliative care   Subjective/Interval History: Patient noted to be more awake today compared to yesterday.  Still very lethargic.  Patient's son is at the bedside.     Assessment/Plan:  Patient with history of atrial fibrillation, intracranial hemorrhage, essential hypertension, seizures, post stroke dysphagia, chronic right-sided congestive heart failure with acute exacerbation.  Patient presented with diffuse volume overload and anasarca.  Also noted to have lactic acidosis and hypothermia.  This was thought to be secondary to hypoperfusion.  Sepsis ruled out.    Patient's prognosis continues to be poor.  Comfort care in progress.  Patient noted to be a bit more responsive today compared to yesterday.  Palliative care is following.    Disposition is unclear.  Her vital signs are stable.  She is more responsive today.  May need to refer her to residential hospice depending on the  next 24 hours.   Status is: Inpatient  Remains inpatient appropriate because:Altered mental status, Unsafe d/c plan and Inpatient level of care appropriate due to severity of illness   Dispo: The patient is from: SNF              Anticipated d/c is to: in-hospital death versus placement to residential hospice              Anticipated d/c date is: 2 days              Patient currently is not medically stable to d/c.      Medications: Scheduled: . Chlorhexidine Gluconate Cloth  6 each Topical Q0600   Continuous:  PJA:[id number] **OR** acetaminophen, antiseptic oral rinse, diphenhydrAMINE, glycopyrrolate **OR** glycopyrrolate **OR** glycopyrrolate, haloperidol **OR** haloperidol **OR** haloperidol lactate, LORazepam **OR** LORazepam **OR** LORazepam, morphine injection, ondansetron **OR** ondansetron (ZOFRAN) IV, polyvinyl alcohol, senna   Objective:  Vital Signs  Vitals:   10/12/20 1944 10/13/20 1551 10/13/20 1925 10/14/20 0900  BP: 102/87 103/76 102/78 111/81  Pulse: 100 (!) 106 (!) 102 (!) 110  Resp: 18  20 18   Temp: (!) 95 F (35 C) (!) 94.1 F (34.5 C) (!) 95.4 F (35.2 C) 98.3 F (36.8 C)  TempSrc: Rectal  Rectal Axillary  SpO2: 95% 97% 97% 96%  Weight:      Height:        Intake/Output Summary (Last 24 hours) at 10/14/2020 1006 Last data filed at 10/13/2020 1800 Gross per 24 hour  Intake 0 ml  Output 200 ml  Net -200 ml  Filed Weights   10/12/20 1045  Weight: 77.9 kg    Patient noted to be more responsive today.  She follows all my commands.  But very lethargic. Continues to have coarse breath sounds bilaterally.  No definite crackles or wheezing S1-S2 is normal regular Abdomen is soft.  Lab Results:  Data Reviewed: I have personally reviewed following labs and imaging studies  CBC: Recent Labs  Lab 10/12/20 1050 10/12/20 1108  WBC 7.7  --   NEUTROABS 5.9  --   HGB 13.4 15.0  HCT 42.4 44.0  MCV 95.9  --   PLT 166  --     Basic  Metabolic Panel: Recent Labs  Lab 10/12/20 1050 10/12/20 1108  NA 137 139  K 4.6 4.3  CL 99  --   CO2 21*  --   GLUCOSE 122*  --   BUN 35*  --   CREATININE 1.60*  --   CALCIUM 10.9*  --   MG 2.2  --   PHOS 3.9  --     GFR: Estimated Creatinine Clearance: 26.1 mL/min (A) (by C-G formula based on SCr of 1.6 mg/dL (H)).  Liver Function Tests: Recent Labs  Lab 10/12/20 1050  AST 42*  ALT 26  ALKPHOS 173*  BILITOT 2.9*  PROT 6.5  ALBUMIN 3.5     Coagulation Profile: Recent Labs  Lab 10/12/20 1050  INR 2.4*    Cardiac Enzymes: Recent Labs  Lab 10/12/20 1050  CKTOTAL 32*    CBG: Recent Labs  Lab 10/12/20 1048  GLUCAP 122*     Recent Results (from the past 240 hour(s))  Blood culture (routine single)     Status: None (Preliminary result)   Collection Time: 10/12/20 10:50 AM   Specimen: BLOOD  Result Value Ref Range Status   Specimen Description BLOOD SITE NOT SPECIFIED  Final   Special Requests   Final    BOTTLES DRAWN AEROBIC AND ANAEROBIC Blood Culture results may not be optimal due to an inadequate volume of blood received in culture bottles   Culture   Final    NO GROWTH 2 DAYS Performed at [facility], [place] [address] [place], [place] [address]    Report Status PENDING  Incomplete  Respiratory Panel by RT PCR (Flu A&B, Covid) - Nasopharyngeal Swab     Status: None   Collection Time: 10/12/20 11:35 AM   Specimen: Nasopharyngeal Swab  Result Value Ref Range Status   SARS Coronavirus 2 by RT PCR NEGATIVE NEGATIVE Final    Comment: (NOTE) SARS-CoV-2 target nucleic acids are NOT DETECTED.  The SARS-CoV-2 RNA is generally detectable in upper respiratoy specimens during the acute phase of infection. The lowest concentration of SARS-CoV-2 viral copies this assay can detect is 131 copies/mL. A negative result does not preclude SARS-Cov-2 infection and should not be used as the sole basis for treatment or other patient management decisions. A  negative result may occur with  improper specimen collection/handling, submission of specimen other than nasopharyngeal swab, presence of viral mutation(s) within the areas targeted by this assay, and inadequate number of viral copies (<131 copies/mL). A negative result must be combined with clinical observations, patient history, and epidemiological information. The expected result is Negative.  Fact Sheet for Patients:  PinkCheek.be  Fact Sheet for Healthcare Providers:  GravelBags.it  This test is no t yet approved or cleared by the [place] FDA and  has been authorized for detection and/or diagnosis of SARS-CoV-2 by FDA under  an Emergency Use Authorization (EUA). This EUA will remain  in effect (meaning this test can be used) for the duration of the COVID-19 declaration under Section 564(b)(1) of the Act, 21 U.S.C. section 360bbb-3(b)(1), unless the authorization is terminated or revoked sooner.     Influenza A by PCR NEGATIVE NEGATIVE Final   Influenza B by PCR NEGATIVE NEGATIVE Final    Comment: (NOTE) The Xpert Xpress SARS-CoV-2/FLU/RSV assay is intended as an aid in  the diagnosis of influenza from Nasopharyngeal swab specimens and  should not be used as a sole basis for treatment. Nasal washings and  aspirates are unacceptable for Xpert Xpress SARS-CoV-2/FLU/RSV  testing.  Fact Sheet for Patients: PinkCheek.be  Fact Sheet for Healthcare Providers: GravelBags.it  This test is not yet approved or cleared by the [place] FDA and  has been authorized for detection and/or diagnosis of SARS-CoV-2 by  FDA under an Emergency Use Authorization (EUA). This EUA will remain  in effect (meaning this test can be used) for the duration of the  Covid-19 declaration under Section 564(b)(1) of the Act, 21  U.S.C. section 360bbb-3(b)(1), unless the authorization  is  terminated or revoked. Performed at [facility], [place] [address], [place], [place] [address]       Radiology Studies: CT Abdomen Pelvis Wo Contrast  Result Date: 10/12/2020 CLINICAL DATA:  Respiratory illness.  Hypoxia EXAM: CT CHEST, ABDOMEN AND PELVIS WITHOUT CONTRAST TECHNIQUE: Multidetector CT imaging of the chest, abdomen and pelvis was performed following the standard protocol without IV contrast. COMPARISON:  CT abdomen and pelvis 03/03/2011 FINDINGS: CT CHEST FINDINGS Cardiovascular: Four-chamber cardiomegaly. No pericardial effusion. Thoracic aorta is nonaneurysmal. There are atherosclerotic calcifications of the aorta and coronary arteries. Main pulmonary trunk is nondilated. Mediastinum/Nodes: No axillary or mediastinal lymphadenopathy. Evaluation of the hilar structures is limited in the absence of intravenous contrast. No obvious hilar mass. Unremarkable thyroid. Trachea and esophagus within normal limits. Lungs/Pleura: Small to moderate right and small left pleural effusions. Associated compressive atelectasis. Remainder of the lung fields are clear. No pneumothorax. Musculoskeletal: No chest wall mass or suspicious bone lesions identified. Diffuse body wall edema. CT ABDOMEN PELVIS FINDINGS Hepatobiliary: Unremarkable unenhanced appearance of the liver. Layering hyperdensity within the gallbladder lumen suggesting biliary sludge. No evidence of biliary dilatation. Pancreas: Grossly unremarkable. Spleen: Unremarkable. Adrenals/Urinary Tract: Nodular configuration of the left adrenal gland similar in appearance to prior CT. Bilateral kidneys are within normal limits. No hydronephrosis. No renal or ureteral stone. Foley catheter is seen within the urinary bladder. Small amount of air within the bladder lumen is likely secondary to catheterization. Stomach/Bowel: Stomach is within normal limits. No evidence of bowel wall thickening, distention, or inflammatory changes. Mild-moderate  volume of stool throughout the colon. Vascular/Lymphatic: Aortic atherosclerosis. IVC is moderately distended, likely related to fluid status. No enlarged abdominal or pelvic lymph nodes. Reproductive: Coarsely calcified fibroids within the uterus. No adnexal masses identified. Other: Small volume ascites. No organized abdominopelvic fluid collection. No pneumoperitoneum. Musculoskeletal: Diffuse body wall edema. No acute osseous findings. IMPRESSION: 1. Findings suggestive of fluid overload state with small bilateral pleural effusions, small volume ascites, and diffuse anasarca. 2. Layering hyperdensity within the gallbladder lumen suggesting biliary sludge. 3. Mild-moderate volume of stool throughout the colon. 4. Four-chamber cardiomegaly. 5. Aortic atherosclerosis.  (ICD10-I70.0). Electronically Signed   By: Davina Poke D.O.   On: 10/12/2020 13:37   CT Head Wo Contrast  Result Date: 10/12/2020 CLINICAL DATA:  Mental status change. EXAM: CT HEAD WITHOUT  CONTRAST TECHNIQUE: Contiguous axial images were obtained from the base of the skull through the vertex without intravenous contrast. COMPARISON:  CT head 08/22/2020, MRI head 08/28/2020. FINDINGS: Evaluation is mildly limited by patient motion. Within this limitation: Brain: Decreased hypoattenuation within the left parietal and occipital lobes, compatible with evolving encephalomalacia at the site of prior hemorrhage. Subtle hypoattenuation at the site of prior left temporal hemorrhage. No evidence of acute hemorrhage. No evidence of acute large vascular territory infarct. Additional scattered white matter hypodensities, likely sequela of chronic microvascular disease. Mild generalized cerebral atrophy with ex vacuo ventricular dilation. No hydrocephalus. No mass lesion or abnormal mass effect. Vascular: Calcific atherosclerosis. Skull: No acute fracture. Sinuses/Orbits: The sinuses are clear. Other: No mastoid effusions. IMPRESSION: 1. No evidence of  acute intracranial abnormality on this motion limited exam. 2. Evolving encephalomalacia at the sites of prior hemorrhage. Electronically Signed   By: Margaretha Sheffield MD   On: 10/12/2020 13:27   CT Chest Wo Contrast  Result Date: 10/12/2020 CLINICAL DATA:  Respiratory illness.  Hypoxia EXAM: CT CHEST, ABDOMEN AND PELVIS WITHOUT CONTRAST TECHNIQUE: Multidetector CT imaging of the chest, abdomen and pelvis was performed following the standard protocol without IV contrast. COMPARISON:  CT abdomen and pelvis 03/03/2011 FINDINGS: CT CHEST FINDINGS Cardiovascular: Four-chamber cardiomegaly. No pericardial effusion. Thoracic aorta is nonaneurysmal. There are atherosclerotic calcifications of the aorta and coronary arteries. Main pulmonary trunk is nondilated. Mediastinum/Nodes: No axillary or mediastinal lymphadenopathy. Evaluation of the hilar structures is limited in the absence of intravenous contrast. No obvious hilar mass. Unremarkable thyroid. Trachea and esophagus within normal limits. Lungs/Pleura: Small to moderate right and small left pleural effusions. Associated compressive atelectasis. Remainder of the lung fields are clear. No pneumothorax. Musculoskeletal: No chest wall mass or suspicious bone lesions identified. Diffuse body wall edema. CT ABDOMEN PELVIS FINDINGS Hepatobiliary: Unremarkable unenhanced appearance of the liver. Layering hyperdensity within the gallbladder lumen suggesting biliary sludge. No evidence of biliary dilatation. Pancreas: Grossly unremarkable. Spleen: Unremarkable. Adrenals/Urinary Tract: Nodular configuration of the left adrenal gland similar in appearance to prior CT. Bilateral kidneys are within normal limits. No hydronephrosis. No renal or ureteral stone. Foley catheter is seen within the urinary bladder. Small amount of air within the bladder lumen is likely secondary to catheterization. Stomach/Bowel: Stomach is within normal limits. No evidence of bowel wall  thickening, distention, or inflammatory changes. Mild-moderate volume of stool throughout the colon. Vascular/Lymphatic: Aortic atherosclerosis. IVC is moderately distended, likely related to fluid status. No enlarged abdominal or pelvic lymph nodes. Reproductive: Coarsely calcified fibroids within the uterus. No adnexal masses identified. Other: Small volume ascites. No organized abdominopelvic fluid collection. No pneumoperitoneum. Musculoskeletal: Diffuse body wall edema. No acute osseous findings. IMPRESSION: 1. Findings suggestive of fluid overload state with small bilateral pleural effusions, small volume ascites, and diffuse anasarca. 2. Layering hyperdensity within the gallbladder lumen suggesting biliary sludge. 3. Mild-moderate volume of stool throughout the colon. 4. Four-chamber cardiomegaly. 5. Aortic atherosclerosis.  (ICD10-I70.0). Electronically Signed   By: Davina Poke D.O.   On: 10/12/2020 13:37   DG Chest Port 1 View  Result Date: 10/12/2020 CLINICAL DATA:  Sepsis EXAM: PORTABLE CHEST 1 VIEW COMPARISON:  September 06, 2020 FINDINGS: There is cardiomegaly with pulmonary venous hypertension. There are pleural effusions bilaterally. There is bibasilar atelectasis. No frank edema or consolidation. No adenopathy. There is aortic atherosclerosis. No bone lesions. There is a skin fold on the right laterally. IMPRESSION: Cardiomegaly with pulmonary vascular congestion and pleural effusions. Suspect a degree of  congestive heart failure. Bibasilar atelectasis. No frank edema or consolidation, however. Aortic Atherosclerosis (ICD10-I70.0). Electronically Signed   By: Lowella Grip III M.D.   On: 10/12/2020 11:10       LOS: 2 days   [facility] Pager on [email or web address]  10/14/2020, 10:06 AM

## 2020-10-15 ENCOUNTER — Other Ambulatory Visit: Payer: Medicare Other

## 2020-10-15 DIAGNOSIS — J9601 Acute respiratory failure with hypoxia: Secondary | ICD-10-CM

## 2020-10-15 MED ORDER — METOPROLOL TARTRATE 5 MG/5ML IV SOLN
5.0000 mg | Freq: Four times a day (QID) | INTRAVENOUS | Status: DC
  Administered 2020-10-15 – 2020-10-17 (×7): 5 mg via INTRAVENOUS
  Filled 2020-10-15 (×7): qty 5

## 2020-10-15 NOTE — Progress Notes (Signed)
[facility] PROGRESS NOTE   Lori Bridges AQT:[id number] DOB: 1930/03/04 DOA: 10/12/2020  PCP: Derinda Late, MD  Brief History/Interval Summary: 86 y.o. female with medical history significant of pulmonary HTN; afib; hypothyroidism; HTN; and stage 3 CKD presenting with SOB.    She was previously hospitalized on multiple occasions from July through September for sequelae of an ICH/CVA including seizures and need for rehab.  Since d/c to SNF, her daughter reports that she has not been doing very well.  She was talking some and participating in some therapy, but was having worsening pain over the last week or more and SOB for a week.  She has been constipated with overflow incontinence for about 2 weeks - disimpaction was done in the ER with apparent relief.  The doctor at rehab increased her Lasix dose.  Patient noted to be very lethargic.  Noted to have elevated lactic acid levels.  Overall prognosis was thought to be poor.  Discussed with family members.  Transitioned to comfort care.   Consultants: Palliative care   Subjective/Interval History: Patient noted to be somnolent this morning.  Opens her eyes briefly but goes right back to sleep.  Not as engaging as yesterday.  Daughter is at the bedside.     Assessment/Plan:  Patient with history of atrial fibrillation, intracranial hemorrhage, essential hypertension, seizures, post stroke dysphagia, chronic right-sided congestive heart failure with acute exacerbation.  Patient presented with diffuse volume overload and anasarca.  Also noted to have lactic acidosis and hypothermia.  This was thought to be secondary to hypoperfusion.  Sepsis was ruled out.    Patient's prognosis continues to be poor.  Comfort care in progress.    Patient was more responsive yesterday.  Also 8 yesterday.  Seems to be somnolent this morning.  Discussed with daughter.  Her course callbacks and wane.  Plan is for residential hospice placement.       Status is: Inpatient Remains inpatient appropriate because:Altered mental status, Unsafe d/c plan and Inpatient level of care appropriate due to severity of illness Dispo: The patient is from: SNF              Anticipated d/c is to: in-hospital death versus placement to residential hospice              Anticipated d/c date is: 2 days              Patient currently is not medically stable to d/c.      Medications: Scheduled: . Chlorhexidine Gluconate Cloth  6 each Topical Q0600   Continuous:  TGY:[id number] **OR** acetaminophen, antiseptic oral rinse, diphenhydrAMINE, glycopyrrolate **OR** glycopyrrolate **OR** glycopyrrolate, haloperidol **OR** haloperidol **OR** haloperidol lactate, LORazepam **OR** LORazepam **OR** LORazepam, morphine injection, ondansetron **OR** ondansetron (ZOFRAN) IV, polyvinyl alcohol, senna   Objective:  Vital Signs  Vitals:   10/13/20 1551 10/13/20 1925 10/14/20 0900 10/14/20 1950  BP: 103/76 102/78 111/81 103/88  Pulse: (!) 106 (!) 102 (!) 110 (!) 108  Resp:  20 18 16   Temp: (!) 94.1 F (34.5 C) (!) 95.4 F (35.2 C) 98.3 F (36.8 C) 98 F (36.7 C)  TempSrc:  Rectal Axillary Oral  SpO2: 97% 97% 96%   Weight:      Height:        Intake/Output Summary (Last 24 hours) at 10/15/2020 1048 Last data filed at 10/14/2020 2200 Gross per 24 hour  Intake 240 ml  Output 600 ml  Net -360 ml   Filed Weights   10/12/20  1045  Weight: 77.9 kg   Noted to be lethargic. Coarse breath sounds bilaterally.  Mildly tachypneic.  No wheezing or rhonchi. S1-S2 is normal regular Abdomen is soft.  Lab Results:  Data Reviewed: I have personally reviewed following labs and imaging studies  CBC: Recent Labs  Lab 10/12/20 1050 10/12/20 1108  WBC 7.7  --   NEUTROABS 5.9  --   HGB 13.4 15.0  HCT 42.4 44.0  MCV 95.9  --   PLT 166  --     Basic Metabolic Panel: Recent Labs  Lab 10/12/20 1050 10/12/20 1108  NA 137 139  K 4.6 4.3  CL 99  --    CO2 21*  --   GLUCOSE 122*  --   BUN 35*  --   CREATININE 1.60*  --   CALCIUM 10.9*  --   MG 2.2  --   PHOS 3.9  --     GFR: Estimated Creatinine Clearance: 26.1 mL/min (A) (by C-G formula based on SCr of 1.6 mg/dL (H)).  Liver Function Tests: Recent Labs  Lab 10/12/20 1050  AST 42*  ALT 26  ALKPHOS 173*  BILITOT 2.9*  PROT 6.5  ALBUMIN 3.5     Coagulation Profile: Recent Labs  Lab 10/12/20 1050  INR 2.4*    Cardiac Enzymes: Recent Labs  Lab 10/12/20 1050  CKTOTAL 32*    CBG: Recent Labs  Lab 10/12/20 1048  GLUCAP 122*     Recent Results (from the past 240 hour(s))  Blood culture (routine single)     Status: None (Preliminary result)   Collection Time: 10/12/20 10:50 AM   Specimen: BLOOD  Result Value Ref Range Status   Specimen Description BLOOD SITE NOT SPECIFIED  Final   Special Requests   Final    BOTTLES DRAWN AEROBIC AND ANAEROBIC Blood Culture results may not be optimal due to an inadequate volume of blood received in culture bottles   Culture   Final    NO GROWTH 3 DAYS Performed at [facility], [place] [address] [place], [place] [address]    Report Status PENDING  Incomplete  Respiratory Panel by RT PCR (Flu A&B, Covid) - Nasopharyngeal Swab     Status: None   Collection Time: 10/12/20 11:35 AM   Specimen: Nasopharyngeal Swab  Result Value Ref Range Status   SARS Coronavirus 2 by RT PCR NEGATIVE NEGATIVE Final    Comment: (NOTE) SARS-CoV-2 target nucleic acids are NOT DETECTED.  The SARS-CoV-2 RNA is generally detectable in upper respiratoy specimens during the acute phase of infection. The lowest concentration of SARS-CoV-2 viral copies this assay can detect is 131 copies/mL. A negative result does not preclude SARS-Cov-2 infection and should not be used as the sole basis for treatment or other patient management decisions. A negative result may occur with  improper specimen collection/handling, submission of specimen  other than nasopharyngeal swab, presence of viral mutation(s) within the areas targeted by this assay, and inadequate number of viral copies (<131 copies/mL). A negative result must be combined with clinical observations, patient history, and epidemiological information. The expected result is Negative.  Fact Sheet for Patients:  PinkCheek.be  Fact Sheet for Healthcare Providers:  GravelBags.it  This test is no t yet approved or cleared by the [place] FDA and  has been authorized for detection and/or diagnosis of SARS-CoV-2 by FDA under an Emergency Use Authorization (EUA). This EUA will remain  in effect (meaning this test can be used) for the  duration of the COVID-19 declaration under Section 564(b)(1) of the Act, 21 U.S.C. section 360bbb-3(b)(1), unless the authorization is terminated or revoked sooner.     Influenza A by PCR NEGATIVE NEGATIVE Final   Influenza B by PCR NEGATIVE NEGATIVE Final    Comment: (NOTE) The Xpert Xpress SARS-CoV-2/FLU/RSV assay is intended as an aid in  the diagnosis of influenza from Nasopharyngeal swab specimens and  should not be used as a sole basis for treatment. Nasal washings and  aspirates are unacceptable for Xpert Xpress SARS-CoV-2/FLU/RSV  testing.  Fact Sheet for Patients: PinkCheek.be  Fact Sheet for Healthcare Providers: GravelBags.it  This test is not yet approved or cleared by the [place] FDA and  has been authorized for detection and/or diagnosis of SARS-CoV-2 by  FDA under an Emergency Use Authorization (EUA). This EUA will remain  in effect (meaning this test can be used) for the duration of the  Covid-19 declaration under Section 564(b)(1) of the Act, 21  U.S.C. section 360bbb-3(b)(1), unless the authorization is  terminated or revoked. Performed at [facility], [place] [address]  [place], [place] [address]       Radiology Studies: No results found.     LOS: 3 days   Sara Selvidge [employer] on [email or web address]  10/15/2020, 10:48 AM

## 2020-10-15 NOTE — Progress Notes (Signed)
° °                                                                                                                                                     °                                                   °Daily Progress Note  ° °Patient Name: Lori Bridges       Date: 10/15/2020 °DOB: 03/12/1930  Age: 86 y.o. MRN#: [id number] °Attending Physician: Krishnan, Gokul, MD °Primary Care Physician: Blomgren, Peter, MD °Admit Date: 10/12/2020 ° °Reason for Consultation/Follow-up: symptom management  ° ° °Subjective: °Met with patient and her daughter at bedside.  Patient appears comfortable.  Daughter reports she is less interactive today.  RN indicates patient is too lethargic to safely eat. ° °Patient is rather tachycardic (120s+).  Discussed my concern with daughter that sometimes that rapid of a heart rate can be uncomfortable.  Discussed using morphine vs metoprolol to ease it off. ° °Daughter preferred to avoid morphine if possible - the patient has been receiving ativan for comfort.  Per RN oral meds are not going to be able to be reliably given. ° ° °Assessment: °Patient appears comfortable but has a very rapid heart rate - working hard. ° ° °Patient Profile/HPI:  Per intake H&P --> "Lori Bridges is a 86 y.o. female with medical history significant of pulmonary HTN; afib; hypothyroidism; HTN; and stage 3 CKD presenting with SOB.  She as previously serially hospitalized from July through September for sequelae of an ICH/CVA including seizures and need for rehab.  Since d/c to SNF, her daughter reports that she has not been doing very well.  She was talking some and participating in some therapy, but was having worsening pain over the last week or more and SOB for a week.  This week, she had diffuse swelling.  She has been constipated with overflow incontinence for about 2 weeks - disimpaction was done in the ER today with apparent relief.  The doctor at rehab increased her Lasix dose yesterday."  (Thursday). °  °Palliative medicine team is familiar with Lori Bridges and her family from her September admission. She also has hyperlipidemia, GERD, and degenerative disc disease.   Decision was made to initiate comfort measures and request a bed at Hospice facility. ° ° ° °Length of Stay: 3 ° ° °Vital Signs: BP 103/88 (BP Location: Left Arm)    Pulse (!) 108    Temp 98 °F (36.7 °C) (Oral)    Resp 16    Ht 5' 7" (1.702 m)    Wt 77.9 kg    SpO2 96%    BMI 26.90 kg/m²  °SpO2: SpO2: 96 % °O2 Device: O2   Device: Bi-PAP °O2 Flow Rate: O2 Flow Rate (L/min): 15 L/min ° °     °Palliative Assessment/Data: 10% ° ° ° ° °Palliative Care Plan  ° ° °Recommendations/Plan: °· Will add low dose scheduled IV metoprolol to be held for bradycardia. °· Continue comfort measures while waiting for hospice placement. ° °Code Status:  DNR ° °Prognosis: °· Hours to days. ° °Discharge Planning: °· Hospice facility although hospital death would not be surprising. ° °Care plan was discussed with daughter at bedside and RN. ° °Thank you for allowing the Palliative Medicine Team to assist in the care of this patient. ° °Total time spent:  25 min. ° °   °Greater than 50%  of this time was spent counseling and coordinating care related to the above assessment and plan. ° ° , PA-C °Palliative Medicine ° °Please contact Palliative MedicineTeam phone at [phone] for questions and concerns between 7 am - 7 pm.   Please see AMION for individual provider pager numbers. ° ° ° ° ° ° °

## 2020-10-15 NOTE — Progress Notes (Signed)
Engineer, maintenance [facility]) Hospital Liaison note.     Received request from [place], [place] for family interest in [facility]. [facility] is unable to offer a room today. Hospital Liaison will follow up tomorrow or sooner if a room becomes available.     A Please do not hesitate to call with questions.     Thank you,    Farrel Gordon, RN, [facility]       [place] (listed on AMION under [place])     [phone]

## 2020-10-15 NOTE — Progress Notes (Signed)
CSW received consult for hospice placement with a family preference for [facility]. CSW sent referral for review.   Sady Monaco LCSW

## 2020-10-16 MED ORDER — ACETAMINOPHEN 325 MG PO TABS: 650.0000 mg | ORAL_TABLET | Freq: Four times a day (QID) | ORAL | Status: AC | PRN

## 2020-10-16 MED ORDER — GLYCOPYRROLATE 0.2 MG/ML IJ SOLN: 0.2000 mg | INTRAMUSCULAR | Status: AC | PRN

## 2020-10-16 MED ORDER — METOPROLOL TARTRATE 5 MG/5ML IV SOLN: 5.0000 mg | Freq: Four times a day (QID) | INTRAVENOUS | Status: AC

## 2020-10-16 MED ORDER — LORAZEPAM 1 MG PO TABS: 1.0000 mg | ORAL_TABLET | ORAL | 0 refills | Status: AC | PRN

## 2020-10-16 MED ORDER — ONDANSETRON HCL 4 MG/2ML IJ SOLN: 4.0000 mg | Freq: Four times a day (QID) | INTRAMUSCULAR | 0 refills | Status: AC | PRN

## 2020-10-16 MED ORDER — GLYCOPYRROLATE 1 MG PO TABS: 1.0000 mg | ORAL_TABLET | ORAL | Status: AC | PRN

## 2020-10-16 MED ORDER — ACETAMINOPHEN 650 MG RE SUPP: 650.0000 mg | Freq: Four times a day (QID) | RECTAL | 0 refills | Status: AC | PRN

## 2020-10-16 MED ORDER — LORAZEPAM 2 MG/ML IJ SOLN: 1.0000 mg | INTRAMUSCULAR | 0 refills | Status: AC | PRN

## 2020-10-16 MED ORDER — ONDANSETRON 4 MG PO TBDP: 4.0000 mg | ORAL_TABLET | Freq: Four times a day (QID) | ORAL | 0 refills | Status: AC | PRN

## 2020-10-16 MED ORDER — DIPHENHYDRAMINE HCL 50 MG/ML IJ SOLN: 12.5000 mg | INTRAMUSCULAR | 0 refills | Status: AC | PRN

## 2020-10-16 MED ORDER — LORAZEPAM 2 MG/ML PO CONC
0.5000 mg | Freq: Four times a day (QID) | ORAL | Status: DC
  Administered 2020-10-16 – 2020-10-17 (×4): 0.5 mg via ORAL
  Filled 2020-10-16 (×4): qty 1

## 2020-10-16 MED ORDER — HALOPERIDOL 0.5 MG PO TABS: 0.5000 mg | ORAL_TABLET | ORAL | Status: AC | PRN

## 2020-10-16 MED ORDER — LORAZEPAM 2 MG/ML PO CONC: 0.5000 mg | Freq: Four times a day (QID) | ORAL | 0 refills | Status: AC

## 2020-10-16 MED ORDER — MORPHINE SULFATE (PF) 2 MG/ML IV SOLN: 1.0000 mg | INTRAVENOUS | 0 refills | Status: AC | PRN

## 2020-10-16 MED ORDER — LORAZEPAM 2 MG/ML PO CONC: 1.0000 mg | ORAL | 0 refills | Status: AC | PRN

## 2020-10-16 MED ORDER — HALOPERIDOL LACTATE 5 MG/ML IJ SOLN: 0.5000 mg | INTRAMUSCULAR | Status: AC | PRN

## 2020-10-16 MED ORDER — HALOPERIDOL LACTATE 2 MG/ML PO CONC: 0.5000 mg | ORAL | 0 refills | Status: AC | PRN

## 2020-10-16 MED ORDER — POLYVINYL ALCOHOL 1.4 % OP SOLN: 1.0000 [drp] | Freq: Four times a day (QID) | OPHTHALMIC | 0 refills | Status: AC | PRN

## 2020-10-16 NOTE — Plan of Care (Signed)
  Problem: Safety: Goal: Ability to remain free from injury will improve Outcome: Progressing   Problem: Pain Managment: Goal: General experience of comfort will improve Outcome: Progressing   Problem: Skin Integrity: Goal: Risk for impaired skin integrity will decrease Outcome: Progressing   

## 2020-10-16 NOTE — Discharge Summary (Signed)
[facility]  Physician Discharge Summary   Patient ID: Lori Bridges MRN: [id number] DOB/AGE: Apr 15, 1930 86 y.o.  Admit date: 10/12/2020 Discharge date: 10/16/2020  PCP: Derinda Late, MD  DISCHARGE DIAGNOSES:  Acute right-sided congestive heart failure Seizure disorder Recent intracranial hemorrhage Atrial fibrillation Essential hypertension   RECOMMENDATIONS FOR OUTPATIENT FOLLOW UP: Patient being discharged to residential hospice     CODE STATUS: DNR  DISCHARGE CONDITION: poor  Diet recommendation: Comfort feeds as tolerated  INITIAL HISTORY: 86 y.o.femalewith medical history significant ofpulmonary HTN; afib; hypothyroidism; HTN; and stage 3 CKD presenting with SOB.   She was previously hospitalized on multiple occasions from July through September for sequelae of an ICH/CVA including seizures and need for rehab. Since d/c to SNF, her daughter reports that she has not been doing very well. She was talking some and participating in some therapy, but was having worsening pain over the last week or more and SOB for a week. She has been constipated with overflow incontinence for about 2 weeks - disimpaction was done in the ER with apparent relief. The doctor at rehab increased her Lasix dose.  Patient noted to be very lethargic.  Noted to have elevated lactic acid levels.  Overall prognosis was thought to be poor.  Discussed with family members.  Transitioned to comfort care.  Consultations:  Palliative care    HOSPITAL COURSE:   Patient with history of atrial fibrillation, intracranial hemorrhage, essential hypertension, seizures, post stroke dysphagia, chronic right-sided congestive heart failure with acute exacerbation.  Patient presented with diffuse volume overload and anasarca.  Also noted to have lactic acidosis and hypothermia.  This was thought to be secondary to hypoperfusion.  Sepsis was ruled out.  Patient thought to have poor prognosis.   After discussions with family patient was transitioned to comfort care.  Patient's mentation has been waxing and waning.  Her oral intake is also fluctuating.  Patient as of now is stable for transfer to residential hospice.  Plan is for this to occur on 10/20.   PERTINENT LABS:  The results of significant diagnostics from this hospitalization (including imaging, microbiology, ancillary and laboratory) are listed below for reference.    Microbiology: Recent Results (from the past 240 hour(s))  Blood culture (routine single)     Status: None (Preliminary result)   Collection Time: 10/12/20 10:50 AM   Specimen: BLOOD  Result Value Ref Range Status   Specimen Description BLOOD SITE NOT SPECIFIED  Final   Special Requests   Final    BOTTLES DRAWN AEROBIC AND ANAEROBIC Blood Culture results may not be optimal due to an inadequate volume of blood received in culture bottles   Culture   Final    NO GROWTH 4 DAYS Performed at [facility], [place] [address], [place], [place] [address]    Report Status PENDING  Incomplete  Respiratory Panel by RT PCR (Flu A&B, Covid) - Nasopharyngeal Swab     Status: None   Collection Time: 10/12/20 11:35 AM   Specimen: Nasopharyngeal Swab  Result Value Ref Range Status   SARS Coronavirus 2 by RT PCR NEGATIVE NEGATIVE Final    Comment: (NOTE) SARS-CoV-2 target nucleic acids are NOT DETECTED.  The SARS-CoV-2 RNA is generally detectable in upper respiratoy specimens during the acute phase of infection. The lowest concentration of SARS-CoV-2 viral copies this assay can detect is 131 copies/mL. A negative result does not preclude SARS-Cov-2 infection and should not be used as the sole basis for treatment or other  patient management decisions. A negative result may occur with  improper specimen collection/handling, submission of specimen other than nasopharyngeal swab, presence of viral mutation(s) within the areas targeted by this assay, and  inadequate number of viral copies (<131 copies/mL). A negative result must be combined with clinical observations, patient history, and epidemiological information. The expected result is Negative.  Fact Sheet for Patients:  PinkCheek.be  Fact Sheet for Healthcare Providers:  GravelBags.it  This test is no t yet approved or cleared by the [place] FDA and  has been authorized for detection and/or diagnosis of SARS-CoV-2 by FDA under an Emergency Use Authorization (EUA). This EUA will remain  in effect (meaning this test can be used) for the duration of the COVID-19 declaration under Section 564(b)(1) of the Act, 21 U.S.C. section 360bbb-3(b)(1), unless the authorization is terminated or revoked sooner.     Influenza A by PCR NEGATIVE NEGATIVE Final   Influenza B by PCR NEGATIVE NEGATIVE Final    Comment: (NOTE) The Xpert Xpress SARS-CoV-2/FLU/RSV assay is intended as an aid in  the diagnosis of influenza from Nasopharyngeal swab specimens and  should not be used as a sole basis for treatment. Nasal washings and  aspirates are unacceptable for Xpert Xpress SARS-CoV-2/FLU/RSV  testing.  Fact Sheet for Patients: PinkCheek.be  Fact Sheet for Healthcare Providers: GravelBags.it  This test is not yet approved or cleared by the [place] FDA and  has been authorized for detection and/or diagnosis of SARS-CoV-2 by  FDA under an Emergency Use Authorization (EUA). This EUA will remain  in effect (meaning this test can be used) for the duration of the  Covid-19 declaration under Section 564(b)(1) of the Act, 21  U.S.C. section 360bbb-3(b)(1), unless the authorization is  terminated or revoked. Performed at [facility], [place] [address], [place], [place] [address]      Labs:     Basic Metabolic Panel: Recent Labs  Lab 10/12/20 1050 10/12/20 1108    NA 137 139  K 4.6 4.3  CL 99  --   CO2 21*  --   GLUCOSE 122*  --   BUN 35*  --   CREATININE 1.60*  --   CALCIUM 10.9*  --   MG 2.2  --   PHOS 3.9  --    Liver Function Tests: Recent Labs  Lab 10/12/20 1050  AST 42*  ALT 26  ALKPHOS 173*  BILITOT 2.9*  PROT 6.5  ALBUMIN 3.5   CBC: Recent Labs  Lab 10/12/20 1050 10/12/20 1108  WBC 7.7  --   NEUTROABS 5.9  --   HGB 13.4 15.0  HCT 42.4 44.0  MCV 95.9  --   PLT 166  --    Cardiac Enzymes: Recent Labs  Lab 10/12/20 1050  CKTOTAL 32*   BNP: BNP (last 3 results) Recent Labs    09/05/20 1020 09/06/20 1341 10/12/20 1050  BNP 766.9* 760.9* 785.6*    CBG: Recent Labs  Lab 10/12/20 1048  GLUCAP 122*     IMAGING STUDIES CT Abdomen Pelvis Wo Contrast  Result Date: 10/12/2020 CLINICAL DATA:  Respiratory illness.  Hypoxia EXAM: CT CHEST, ABDOMEN AND PELVIS WITHOUT CONTRAST TECHNIQUE: Multidetector CT imaging of the chest, abdomen and pelvis was performed following the standard protocol without IV contrast. COMPARISON:  CT abdomen and pelvis 03/03/2011 FINDINGS: CT CHEST FINDINGS Cardiovascular: Four-chamber cardiomegaly. No pericardial effusion. Thoracic aorta is nonaneurysmal. There are atherosclerotic calcifications of the aorta and coronary arteries. Main pulmonary trunk is  nondilated. Mediastinum/Nodes: No axillary or mediastinal lymphadenopathy. Evaluation of the hilar structures is limited in the absence of intravenous contrast. No obvious hilar mass. Unremarkable thyroid. Trachea and esophagus within normal limits. Lungs/Pleura: Small to moderate right and small left pleural effusions. Associated compressive atelectasis. Remainder of the lung fields are clear. No pneumothorax. Musculoskeletal: No chest wall mass or suspicious bone lesions identified. Diffuse body wall edema. CT ABDOMEN PELVIS FINDINGS Hepatobiliary: Unremarkable unenhanced appearance of the liver. Layering hyperdensity within the gallbladder  lumen suggesting biliary sludge. No evidence of biliary dilatation. Pancreas: Grossly unremarkable. Spleen: Unremarkable. Adrenals/Urinary Tract: Nodular configuration of the left adrenal gland similar in appearance to prior CT. Bilateral kidneys are within normal limits. No hydronephrosis. No renal or ureteral stone. Foley catheter is seen within the urinary bladder. Small amount of air within the bladder lumen is likely secondary to catheterization. Stomach/Bowel: Stomach is within normal limits. No evidence of bowel wall thickening, distention, or inflammatory changes. Mild-moderate volume of stool throughout the colon. Vascular/Lymphatic: Aortic atherosclerosis. IVC is moderately distended, likely related to fluid status. No enlarged abdominal or pelvic lymph nodes. Reproductive: Coarsely calcified fibroids within the uterus. No adnexal masses identified. Other: Small volume ascites. No organized abdominopelvic fluid collection. No pneumoperitoneum. Musculoskeletal: Diffuse body wall edema. No acute osseous findings. IMPRESSION: 1. Findings suggestive of fluid overload state with small bilateral pleural effusions, small volume ascites, and diffuse anasarca. 2. Layering hyperdensity within the gallbladder lumen suggesting biliary sludge. 3. Mild-moderate volume of stool throughout the colon. 4. Four-chamber cardiomegaly. 5. Aortic atherosclerosis.  (ICD10-I70.0). Electronically Signed   By: Davina Poke D.O.   On: 10/12/2020 13:37   CT Head Wo Contrast  Result Date: 10/12/2020 CLINICAL DATA:  Mental status change. EXAM: CT HEAD WITHOUT CONTRAST TECHNIQUE: Contiguous axial images were obtained from the base of the skull through the vertex without intravenous contrast. COMPARISON:  CT head 08/22/2020, MRI head 08/28/2020. FINDINGS: Evaluation is mildly limited by patient motion. Within this limitation: Brain: Decreased hypoattenuation within the left parietal and occipital lobes, compatible with evolving  encephalomalacia at the site of prior hemorrhage. Subtle hypoattenuation at the site of prior left temporal hemorrhage. No evidence of acute hemorrhage. No evidence of acute large vascular territory infarct. Additional scattered white matter hypodensities, likely sequela of chronic microvascular disease. Mild generalized cerebral atrophy with ex vacuo ventricular dilation. No hydrocephalus. No mass lesion or abnormal mass effect. Vascular: Calcific atherosclerosis. Skull: No acute fracture. Sinuses/Orbits: The sinuses are clear. Other: No mastoid effusions. IMPRESSION: 1. No evidence of acute intracranial abnormality on this motion limited exam. 2. Evolving encephalomalacia at the sites of prior hemorrhage. Electronically Signed   By: Margaretha Sheffield MD   On: 10/12/2020 13:27   CT Chest Wo Contrast  Result Date: 10/12/2020 CLINICAL DATA:  Respiratory illness.  Hypoxia EXAM: CT CHEST, ABDOMEN AND PELVIS WITHOUT CONTRAST TECHNIQUE: Multidetector CT imaging of the chest, abdomen and pelvis was performed following the standard protocol without IV contrast. COMPARISON:  CT abdomen and pelvis 03/03/2011 FINDINGS: CT CHEST FINDINGS Cardiovascular: Four-chamber cardiomegaly. No pericardial effusion. Thoracic aorta is nonaneurysmal. There are atherosclerotic calcifications of the aorta and coronary arteries. Main pulmonary trunk is nondilated. Mediastinum/Nodes: No axillary or mediastinal lymphadenopathy. Evaluation of the hilar structures is limited in the absence of intravenous contrast. No obvious hilar mass. Unremarkable thyroid. Trachea and esophagus within normal limits. Lungs/Pleura: Small to moderate right and small left pleural effusions. Associated compressive atelectasis. Remainder of the lung fields are clear. No pneumothorax. Musculoskeletal: No chest wall  mass or suspicious bone lesions identified. Diffuse body wall edema. CT ABDOMEN PELVIS FINDINGS Hepatobiliary: Unremarkable unenhanced appearance of  the liver. Layering hyperdensity within the gallbladder lumen suggesting biliary sludge. No evidence of biliary dilatation. Pancreas: Grossly unremarkable. Spleen: Unremarkable. Adrenals/Urinary Tract: Nodular configuration of the left adrenal gland similar in appearance to prior CT. Bilateral kidneys are within normal limits. No hydronephrosis. No renal or ureteral stone. Foley catheter is seen within the urinary bladder. Small amount of air within the bladder lumen is likely secondary to catheterization. Stomach/Bowel: Stomach is within normal limits. No evidence of bowel wall thickening, distention, or inflammatory changes. Mild-moderate volume of stool throughout the colon. Vascular/Lymphatic: Aortic atherosclerosis. IVC is moderately distended, likely related to fluid status. No enlarged abdominal or pelvic lymph nodes. Reproductive: Coarsely calcified fibroids within the uterus. No adnexal masses identified. Other: Small volume ascites. No organized abdominopelvic fluid collection. No pneumoperitoneum. Musculoskeletal: Diffuse body wall edema. No acute osseous findings. IMPRESSION: 1. Findings suggestive of fluid overload state with small bilateral pleural effusions, small volume ascites, and diffuse anasarca. 2. Layering hyperdensity within the gallbladder lumen suggesting biliary sludge. 3. Mild-moderate volume of stool throughout the colon. 4. Four-chamber cardiomegaly. 5. Aortic atherosclerosis.  (ICD10-I70.0). Electronically Signed   By: Davina Poke D.O.   On: 10/12/2020 13:37   DG Chest Port 1 View  Result Date: 10/12/2020 CLINICAL DATA:  Sepsis EXAM: PORTABLE CHEST 1 VIEW COMPARISON:  September 06, 2020 FINDINGS: There is cardiomegaly with pulmonary venous hypertension. There are pleural effusions bilaterally. There is bibasilar atelectasis. No frank edema or consolidation. No adenopathy. There is aortic atherosclerosis. No bone lesions. There is a skin fold on the right laterally. IMPRESSION:  Cardiomegaly with pulmonary vascular congestion and pleural effusions. Suspect a degree of congestive heart failure. Bibasilar atelectasis. No frank edema or consolidation, however. Aortic Atherosclerosis (ICD10-I70.0). Electronically Signed   By: Lowella Grip III M.D.   On: 10/12/2020 11:10    DISCHARGE EXAMINATION: Vitals:   10/14/20 0900 10/14/20 1950 10/16/20 0044 10/16/20 0550  BP: 111/81 103/88 119/87 125/89  Pulse: (!) 110 (!) 108 (!) 118 (!) 111  Resp: 18 16 16 13   Temp: 98.3 F (36.8 C) 98 F (36.7 C) 98.2 F (36.8 C) (!) 96 F (35.6 C)  TempSrc: Axillary Oral Oral Axillary  SpO2: 96%  99% 95%  Weight:    72.3 kg  Height:       See progress note from earlier today  DISPOSITION: Residential hospice     Allergies as of 10/16/2020      Reactions   Doxycycline Other (See Comments)    ("Allergic," per MAR)   Tape Other (See Comments)   SKIN IS VERY THIN- WILL TEAR EASILY!! ("Allergic," per [facility])   Ezetimibe Other (See Comments)   Arthralgia, Myalgias and  ("Allergic," per [facility])   Meclizine Palpitations, Other (See Comments)   Tachycardia and  ("Allergic," per MAR)   Pravastatin Other (See Comments)   Arthralgia, Myalgias and  ("Allergic," per MAR)   Colestipol Other (See Comments)   Malaise and  ("Allergic," per MAR)   Sulfamethoxazole Other (See Comments)   "Allergic," per [facility]   Ace Inhibitors Other (See Comments), Cough   "Allergic," per MAR   Codeine Nausea Only, Other (See Comments)    ("Allergic," per MAR)   Colestipol Hcl Other (See Comments)   Malaise   Lovastatin Other (See Comments)   Reaction not recalled- possibly muscle pain and  ("Allergic," per MAR)   Sulfa Antibiotics Other (See  Comments)   "Allergic," per [facility]      Medication List    STOP taking these medications   amantadine 100 MG capsule Commonly known as: SYMMETREL   apixaban 2.5 MG Tabs tablet Commonly known as: ELIQUIS   feeding supplement Liqd   furosemide 40 MG  tablet Commonly known as: LASIX   levETIRAcetam 1000 MG tablet Commonly known as: KEPPRA   LORazepam 0.5 MG tablet Commonly known as: ATIVAN Replaced by: LORazepam 2 MG/ML concentrated solution   metoprolol tartrate 100 MG tablet Commonly known as: LOPRESSOR Replaced by: metoprolol tartrate 5 MG/5ML Soln injection   multivitamin with minerals Tabs tablet   NON FORMULARY   pantoprazole 40 MG tablet Commonly known as: Protonix   PHENobarbital 64.8 MG tablet Commonly known as: LUMINAL   Synthroid 50 MCG tablet Generic drug: levothyroxine     TAKE these medications   acetaminophen 325 MG tablet Commonly known as: TYLENOL Take 2 tablets (650 mg total) by mouth every 6 (six) hours as needed for mild pain (or Fever >/= 101). What changed:   when to take this  reasons to take this   acetaminophen 650 MG suppository Commonly known as: TYLENOL Place 1 suppository (650 mg total) rectally every 6 (six) hours as needed for mild pain (or Fever >/= 101). What changed: You were already taking a medication with the same name, and this prescription was added. Make sure you understand how and when to take each.   diphenhydrAMINE 50 MG/ML injection Commonly known as: BENADRYL Inject 0.25 mLs (12.5 mg total) into the vein every 4 (four) hours as needed for itching.   glycopyrrolate 1 MG tablet Commonly known as: ROBINUL Take 1 tablet (1 mg total) by mouth every 4 (four) hours as needed (excessive secretions).   glycopyrrolate 0.2 MG/ML injection Commonly known as: ROBINUL Inject 1 mL (0.2 mg total) into the skin every 4 (four) hours as needed (excessive secretions).   glycopyrrolate 0.2 MG/ML injection Commonly known as: ROBINUL Inject 1 mL (0.2 mg total) into the vein every 4 (four) hours as needed (excessive secretions).   haloperidol 0.5 MG tablet Commonly known as: HALDOL Take 1 tablet (0.5 mg total) by mouth every 4 (four) hours as needed for agitation (or delirium).    haloperidol 2 MG/ML solution Commonly known as: HALDOL Place 0.3 mLs (0.6 mg total) under the tongue every 4 (four) hours as needed for agitation (or delirium).   haloperidol lactate 5 MG/ML injection Commonly known as: HALDOL Inject 0.1 mLs (0.5 mg total) into the vein every 4 (four) hours as needed (or delirium).   LORazepam 2 MG/ML concentrated solution Commonly known as: ATIVAN Take 0.3 mLs (0.6 mg total) by mouth every 6 (six) hours. Replaces: LORazepam 0.5 MG tablet   LORazepam 1 MG tablet Commonly known as: ATIVAN Take 1 tablet (1 mg total) by mouth every 4 (four) hours as needed for anxiety.   LORazepam 2 MG/ML concentrated solution Commonly known as: ATIVAN Place 0.5 mLs (1 mg total) under the tongue every 4 (four) hours as needed for anxiety.   LORazepam 2 MG/ML injection Commonly known as: ATIVAN Inject 0.5 mLs (1 mg total) into the vein every 4 (four) hours as needed for anxiety.   metoprolol tartrate 5 MG/5ML Soln injection Commonly known as: LOPRESSOR Inject 5 mLs (5 mg total) into the vein every 6 (six) hours. Replaces: metoprolol tartrate 100 MG tablet   morphine 2 MG/ML injection Inject 0.5 mLs (1 mg total) into the vein every  2 (two) hours as needed (or dyspnea).   ondansetron 4 MG disintegrating tablet Commonly known as: ZOFRAN-ODT Take 1 tablet (4 mg total) by mouth every 6 (six) hours as needed for nausea.   ondansetron 4 MG/2ML Soln injection Commonly known as: ZOFRAN Inject 2 mLs (4 mg total) into the vein every 6 (six) hours as needed for nausea.   polyvinyl alcohol 1.4 % ophthalmic solution Commonly known as: LIQUIFILM TEARS Place 1 drop into both eyes 4 (four) times daily as needed for dry eyes.          TOTAL DISCHARGE TIME: 35 minutes  Jonessa Triplett [employer] on [email or web address]  10/16/2020, 4:55 PM

## 2020-10-16 NOTE — TOC Progression Note (Signed)
Transition of Care [facility]) - Progression Note    Patient Details  Name: Lori Bridges MRN: [id number] Date of Birth: 13-Mar-1930  Transition of Care [facility]) CM/SW [place], [place] Phone Number: 10/16/2020, 1:50 PM  Clinical Narrative:     CSW spoke with Bevely Palmer with [facility] who confirmed no bed availability today for patient. CSW to continue to follow and assist with discharge planning needs.       Expected Discharge Plan and Services                                                 Social Determinants of Health (SDOH) Interventions    Readmission Risk Interventions No flowsheet data found.

## 2020-10-16 NOTE — Progress Notes (Signed)
[facility] PROGRESS NOTE   Lori Bridges MVE:[id number] DOB: 1930-05-13 DOA: 10/12/2020  PCP: Derinda Late, MD  Brief History/Interval Summary: 86 y.o. female with medical history significant of pulmonary HTN; afib; hypothyroidism; HTN; and stage 3 CKD presenting with SOB.    She was previously hospitalized on multiple occasions from July through September for sequelae of an ICH/CVA including seizures and need for rehab.  Since d/c to SNF, her daughter reports that she has not been doing very well.  She was talking some and participating in some therapy, but was having worsening pain over the last week or more and SOB for a week.  She has been constipated with overflow incontinence for about 2 weeks - disimpaction was done in the ER with apparent relief.  The doctor at rehab increased her Lasix dose.  Patient noted to be very lethargic.  Noted to have elevated lactic acid levels.  Overall prognosis was thought to be poor.  Discussed with family members.  Transitioned to comfort care.   Consultants: Palliative care   Subjective/Interval History: Patient lethargic this morning. Easily arousable but goes right back to sleep.     Assessment/Plan:  Patient with history of atrial fibrillation, intracranial hemorrhage, essential hypertension, seizures, post stroke dysphagia, chronic right-sided congestive heart failure with acute exacerbation.  Patient presented with diffuse volume overload and anasarca.  Also noted to have lactic acidosis and hypothermia.  This was thought to be secondary to hypoperfusion.  Sepsis was ruled out.  Patient thought to have poor prognosis.  After discussions with family patient was transitioned to comfort care.  Patient's mentation has been waxing and waning.  Her oral intake was quite good 2 days ago but she did not eat much yesterday.  Plan is for residential hospice placement.   Status is: Inpatient Remains inpatient appropriate because:Altered  mental status, Unsafe d/c plan and Inpatient level of care appropriate due to severity of illness Dispo: The patient is from: SNF              Anticipated d/c is to: in-hospital death versus placement to residential hospice              Anticipated d/c date is: 2 days              Patient currently is not medically stable to d/c.      Medications: Scheduled: . Chlorhexidine Gluconate Cloth  6 each Topical Q0600  . metoprolol tartrate  5 mg Intravenous Q6H   Continuous:  GEZ:[id number] **OR** acetaminophen, antiseptic oral rinse, diphenhydrAMINE, glycopyrrolate **OR** glycopyrrolate **OR** glycopyrrolate, haloperidol **OR** haloperidol **OR** haloperidol lactate, LORazepam **OR** LORazepam **OR** LORazepam, morphine injection, ondansetron **OR** ondansetron (ZOFRAN) IV, polyvinyl alcohol, senna   Objective:  Vital Signs  Vitals:   10/14/20 0900 10/14/20 1950 10/16/20 0044 10/16/20 0550  BP: 111/81 103/88 119/87 125/89  Pulse: (!) 110 (!) 108 (!) 118 (!) 111  Resp: 18 16 16 13   Temp: 98.3 F (36.8 C) 98 F (36.7 C) 98.2 F (36.8 C) (!) 96 F (35.6 C)  TempSrc: Axillary Oral Oral Axillary  SpO2: 96%  99% 95%  Weight:    72.3 kg  Height:        Intake/Output Summary (Last 24 hours) at 10/16/2020 0935 Last data filed at 10/16/2020 0553 Gross per 24 hour  Intake 60 ml  Output 950 ml  Net -890 ml   Filed Weights   10/12/20 1045 10/16/20 0550  Weight: 77.9 kg 72.3 kg   Noted  to be lethargic although easily arousable Coarse breath sounds bilaterally.  Mildly tachypneic.  No wheezing or rhonchi S1-S2 is noted to be slightly tachycardic regular.  No S3-S4. Abdomen is soft.  Lab Results:  Data Reviewed: I have personally reviewed following labs and imaging studies  CBC: Recent Labs  Lab 10/12/20 1050 10/12/20 1108  WBC 7.7  --   NEUTROABS 5.9  --   HGB 13.4 15.0  HCT 42.4 44.0  MCV 95.9  --   PLT 166  --     Basic Metabolic Panel: Recent Labs  Lab  10/12/20 1050 10/12/20 1108  NA 137 139  K 4.6 4.3  CL 99  --   CO2 21*  --   GLUCOSE 122*  --   BUN 35*  --   CREATININE 1.60*  --   CALCIUM 10.9*  --   MG 2.2  --   PHOS 3.9  --     GFR: Estimated Creatinine Clearance: 23.6 mL/min (A) (by C-G formula based on SCr of 1.6 mg/dL (H)).  Liver Function Tests: Recent Labs  Lab 10/12/20 1050  AST 42*  ALT 26  ALKPHOS 173*  BILITOT 2.9*  PROT 6.5  ALBUMIN 3.5     Coagulation Profile: Recent Labs  Lab 10/12/20 1050  INR 2.4*    Cardiac Enzymes: Recent Labs  Lab 10/12/20 1050  CKTOTAL 32*    CBG: Recent Labs  Lab 10/12/20 1048  GLUCAP 122*     Recent Results (from the past 240 hour(s))  Blood culture (routine single)     Status: None (Preliminary result)   Collection Time: 10/12/20 10:50 AM   Specimen: BLOOD  Result Value Ref Range Status   Specimen Description BLOOD SITE NOT SPECIFIED  Final   Special Requests   Final    BOTTLES DRAWN AEROBIC AND ANAEROBIC Blood Culture results may not be optimal due to an inadequate volume of blood received in culture bottles   Culture   Final    NO GROWTH 4 DAYS Performed at [facility], [place] [address] [place], [place] [address]    Report Status PENDING  Incomplete  Respiratory Panel by RT PCR (Flu A&B, Covid) - Nasopharyngeal Swab     Status: None   Collection Time: 10/12/20 11:35 AM   Specimen: Nasopharyngeal Swab  Result Value Ref Range Status   SARS Coronavirus 2 by RT PCR NEGATIVE NEGATIVE Final    Comment: (NOTE) SARS-CoV-2 target nucleic acids are NOT DETECTED.  The SARS-CoV-2 RNA is generally detectable in upper respiratoy specimens during the acute phase of infection. The lowest concentration of SARS-CoV-2 viral copies this assay can detect is 131 copies/mL. A negative result does not preclude SARS-Cov-2 infection and should not be used as the sole basis for treatment or other patient management decisions. A negative result may occur with    improper specimen collection/handling, submission of specimen other than nasopharyngeal swab, presence of viral mutation(s) within the areas targeted by this assay, and inadequate number of viral copies (<131 copies/mL). A negative result must be combined with clinical observations, patient history, and epidemiological information. The expected result is Negative.  Fact Sheet for Patients:  PinkCheek.be  Fact Sheet for Healthcare Providers:  GravelBags.it  This test is no t yet approved or cleared by the [place] FDA and  has been authorized for detection and/or diagnosis of SARS-CoV-2 by FDA under an Emergency Use Authorization (EUA). This EUA will remain  in effect (meaning this test can be  used) for the duration of the COVID-19 declaration under Section 564(b)(1) of the Act, 21 U.S.C. section 360bbb-3(b)(1), unless the authorization is terminated or revoked sooner.     Influenza A by PCR NEGATIVE NEGATIVE Final   Influenza B by PCR NEGATIVE NEGATIVE Final    Comment: (NOTE) The Xpert Xpress SARS-CoV-2/FLU/RSV assay is intended as an aid in  the diagnosis of influenza from Nasopharyngeal swab specimens and  should not be used as a sole basis for treatment. Nasal washings and  aspirates are unacceptable for Xpert Xpress SARS-CoV-2/FLU/RSV  testing.  Fact Sheet for Patients: PinkCheek.be  Fact Sheet for Healthcare Providers: GravelBags.it  This test is not yet approved or cleared by the [place] FDA and  has been authorized for detection and/or diagnosis of SARS-CoV-2 by  FDA under an Emergency Use Authorization (EUA). This EUA will remain  in effect (meaning this test can be used) for the duration of the  Covid-19 declaration under Section 564(b)(1) of the Act, 21  U.S.C. section 360bbb-3(b)(1), unless the authorization is  terminated or  revoked. Performed at [facility], [place] [address], [place], [place] [address]       Radiology Studies: No results found.     LOS: 4 days   Kenna Seward [employer] on [email or web address]  10/16/2020, 9:35 AM

## 2020-10-16 NOTE — Progress Notes (Signed)
Manufacturing engineer [facility]) Hospital Liaison note.     Received request from [place], [place] for family interest in [facility]. Chart reviewed and eligibility confirmed. Spoke with family to confirm interest and explain services. Family agreeable to transfer on 10/17/20. TOC aware.     [facility] will notify TOC when registration paperwork has been completed to arrange transport.    RN please call report to [phone].   Thank you,     Farrel Gordon, RN, CCM       [place] (listed on [place] under Hospice[facility])     [phone]

## 2020-10-16 NOTE — Care Management Important Message (Signed)
Important Message  Patient Details  Name: Lori Bridges MRN: [id number] Date of Birth: 06-04-1930   Medicare Important Message Given:  Yes     Shelda Altes 10/16/2020, 11:10 AM

## 2020-10-16 NOTE — Plan of Care (Signed)
  Problem: Pain Managment: Goal: General experience of comfort will improve Outcome: Progressing   Problem: Safety: Goal: Ability to remain free from injury will improve Outcome: Progressing   Problem: Skin Integrity: Goal: Risk for impaired skin integrity will decrease Outcome: Progressing   

## 2020-10-16 NOTE — Progress Notes (Signed)
Palliative Medicine RN Note: Symptom check. Pt appears comfortable, but concerns remain d/t daughter's hesitancy around morphine. Updated Dr Hilma Favors, who scheduled Ativan to help prevent discomfort, as pt appears relaxed as long as she is sleeping. PMT will f/u in the morning.  Marjie Skiff Adonys Wildes, RN, BSN, [facility] Palliative Medicine Team 10/16/2020 2:33 PM Office [phone]

## 2020-10-17 LAB — CULTURE, BLOOD (SINGLE): Culture: NO GROWTH

## 2020-10-17 LAB — LEVETIRACETAM LEVEL: Levetiracetam Lvl: 101 ug/mL — ABNORMAL HIGH (ref 10.0–40.0)

## 2020-10-17 NOTE — Progress Notes (Signed)
Manufacturing engineer [facility]) Hospital Liaison Note:  Update on this [place] Referral:  [employer] paper work are complete for this patient and they are ready to receive this patient once bedside nurse has called report to [phone] and transport has been set-up for transfer. TOC aware of above.  Please call if you have any hospice related questions,  Thank you for the opportunity to participate in this patients care,  Gar Ponto [place] [facility] (on [place]) [phone]

## 2020-10-17 NOTE — TOC Transition Note (Signed)
Transition of Care [facility]) - CM/SW Discharge Note   Patient Details  Name: VERLEY PARISEAU MRN: [id number] Date of Birth: 1930/07/08  Transition of Care [facility]) CM/SW Contact:  Trula Ore, [place] Phone Number: 10/17/2020, 10:26 AM   Clinical Narrative:     Patient will DC to: [place] date: 10/17/2020  Family notified: [name]  Transport by: Corey Harold  ?  Per MD patient ready for DC to [facility] . RN, patient, patient's family, and facility notified of DC.  RN given number for report tele#[phone]. DC packet on chart. DNR signed on chart. Ambulance transport requested for patient.  CSW signing off.   Final next level of care: [place] Barriers to Discharge: No Barriers Identified   Patient Goals and CMS Choice        Discharge Placement              Patient chooses bed at:  [facility]) Patient to be transferred to facility by: [place] Name of family member notified: [name] Patient and family notified of of transfer: 10/17/20  Discharge Plan and Services                                     Social Determinants of Health (SDOH) Interventions     Readmission Risk Interventions No flowsheet data found.

## 2020-10-18 ENCOUNTER — Inpatient Hospital Stay: Payer: Medicare Other | Admitting: Adult Health

## 2020-11-28 DEATH — deceased

## 2023-02-05 ENCOUNTER — Encounter (HOSPITAL_COMMUNITY): Payer: Self-pay | Admitting: *Deleted
